# Patient Record
Sex: Female | Born: 1950
Health system: Southern US, Community
[De-identification: ages and names within clinical notes are randomized; demographics above are authoritative.]

## PROBLEM LIST (undated history)

## (undated) DIAGNOSIS — N200 Calculus of kidney: Secondary | ICD-10-CM

## (undated) DIAGNOSIS — N1831 Chronic kidney disease, stage 3a: Secondary | ICD-10-CM

## (undated) DIAGNOSIS — R7303 Prediabetes: Secondary | ICD-10-CM

## (undated) DIAGNOSIS — I10 Essential (primary) hypertension: Secondary | ICD-10-CM

## (undated) DIAGNOSIS — Z87442 Personal history of urinary calculi: Secondary | ICD-10-CM

## (undated) DIAGNOSIS — B182 Chronic viral hepatitis C: Secondary | ICD-10-CM

## (undated) DIAGNOSIS — N390 Urinary tract infection, site not specified: Secondary | ICD-10-CM

## (undated) DIAGNOSIS — F1120 Opioid dependence, uncomplicated: Secondary | ICD-10-CM

## (undated) DIAGNOSIS — J449 Chronic obstructive pulmonary disease, unspecified: Secondary | ICD-10-CM

## (undated) DIAGNOSIS — K839 Disease of biliary tract, unspecified: Secondary | ICD-10-CM

## (undated) DIAGNOSIS — D696 Thrombocytopenia, unspecified: Secondary | ICD-10-CM

## (undated) DIAGNOSIS — K759 Inflammatory liver disease, unspecified: Secondary | ICD-10-CM

## (undated) DIAGNOSIS — I872 Venous insufficiency (chronic) (peripheral): Secondary | ICD-10-CM

## (undated) DIAGNOSIS — F32A Depression, unspecified: Secondary | ICD-10-CM

## (undated) DIAGNOSIS — A419 Sepsis, unspecified organism: Secondary | ICD-10-CM

## (undated) HISTORY — PX: PLACEMENT OF BREAST IMPLANTS: SHX6334

## (undated) HISTORY — DX: Chronic obstructive pulmonary disease, unspecified: J44.9

## (undated) HISTORY — DX: Urinary tract infection, site not specified: N39.0

## (undated) HISTORY — PX: TONSILLECTOMY: SUR1361

## (undated) HISTORY — DX: Sepsis, unspecified organism: A41.9

## (undated) HISTORY — DX: Disease of biliary tract, unspecified: K83.9

---

## 2004-11-30 ENCOUNTER — Emergency Department: Payer: Self-pay | Admitting: Emergency Medicine

## 2016-04-09 LAB — HM HIV SCREENING LAB: HM HIV Screening: NEGATIVE

## 2016-04-09 LAB — HM HEPATITIS C SCREENING LAB: HM Hepatitis Screen: NEGATIVE

## 2017-05-28 ENCOUNTER — Ambulatory Visit: Payer: Medicaid Other | Admitting: Family Medicine

## 2017-06-16 ENCOUNTER — Ambulatory Visit (INDEPENDENT_AMBULATORY_CARE_PROVIDER_SITE_OTHER): Payer: Medicare Other | Admitting: Family Medicine

## 2017-06-16 ENCOUNTER — Encounter: Payer: Self-pay | Admitting: Family Medicine

## 2017-06-16 ENCOUNTER — Telehealth: Payer: Self-pay

## 2017-06-16 VITALS — BP 144/82 | HR 81 | Temp 98.5°F | Ht 63.1 in | Wt 138.1 lb

## 2017-06-16 DIAGNOSIS — J449 Chronic obstructive pulmonary disease, unspecified: Secondary | ICD-10-CM | POA: Diagnosis not present

## 2017-06-16 DIAGNOSIS — Z720 Tobacco use: Secondary | ICD-10-CM

## 2017-06-16 DIAGNOSIS — R0981 Nasal congestion: Secondary | ICD-10-CM

## 2017-06-16 DIAGNOSIS — Z13220 Encounter for screening for lipoid disorders: Secondary | ICD-10-CM | POA: Diagnosis not present

## 2017-06-16 DIAGNOSIS — Z23 Encounter for immunization: Secondary | ICD-10-CM

## 2017-06-16 DIAGNOSIS — Z13820 Encounter for screening for osteoporosis: Secondary | ICD-10-CM

## 2017-06-16 DIAGNOSIS — R030 Elevated blood-pressure reading, without diagnosis of hypertension: Secondary | ICD-10-CM | POA: Diagnosis not present

## 2017-06-16 DIAGNOSIS — Z1239 Encounter for other screening for malignant neoplasm of breast: Secondary | ICD-10-CM

## 2017-06-16 DIAGNOSIS — Z1231 Encounter for screening mammogram for malignant neoplasm of breast: Secondary | ICD-10-CM | POA: Diagnosis not present

## 2017-06-16 LAB — UA/M W/RFLX CULTURE, ROUTINE
Bilirubin, UA: NEGATIVE
Glucose, UA: NEGATIVE
Ketones, UA: NEGATIVE
LEUKOCYTES UA: NEGATIVE
Nitrite, UA: NEGATIVE
PH UA: 7 (ref 5.0–7.5)
PROTEIN UA: NEGATIVE
RBC, UA: NEGATIVE
Specific Gravity, UA: 1.01 (ref 1.005–1.030)
Urobilinogen, Ur: 2 mg/dL — ABNORMAL HIGH (ref 0.2–1.0)

## 2017-06-16 LAB — MICROSCOPIC EXAMINATION: BACTERIA UA: NONE SEEN

## 2017-06-16 LAB — MICROALBUMIN, URINE WAIVED
CREATININE, URINE WAIVED: 50 mg/dL (ref 10–300)
MICROALB, UR WAIVED: 10 mg/L (ref 0–19)

## 2017-06-16 MED ORDER — ALBUTEROL SULFATE HFA 108 (90 BASE) MCG/ACT IN AERS
2.0000 | INHALATION_SPRAY | Freq: Four times a day (QID) | RESPIRATORY_TRACT | 0 refills | Status: DC | PRN
Start: 2017-06-16 — End: 2017-08-27

## 2017-06-16 MED ORDER — UMECLIDINIUM-VILANTEROL 62.5-25 MCG/INH IN AEPB: 1.0000 | INHALATION_SPRAY | Freq: Every day | RESPIRATORY_TRACT | 3 refills | Status: DC

## 2017-06-16 NOTE — Telephone Encounter (Signed)
Referral faxed to [facility] - [address], [place], [place] ((P: [phone] F: [phone]))

## 2017-06-16 NOTE — Assessment & Plan Note (Signed)
Not under good control. Will start anoro and recheck 2-3 weeks. Albuterol sent to her pharmacy. Call with any concerns.

## 2017-06-16 NOTE — Assessment & Plan Note (Signed)
Advised patient to quit smoking. She is aware. Will let us know when she is ready.

## 2017-06-16 NOTE — Assessment & Plan Note (Signed)
Likely due to over use of decongestants. Given chronicity- will get her into see ENT. Referral generated today.

## 2017-06-16 NOTE — Progress Notes (Signed)
BP (!) 144/82   Pulse 81   Temp 98.5 F (36.9 C)   Ht 5' 3.1" (1.603 m)   Wt 138 lb 1 oz (62.6 kg)   SpO2 93%   BMI 24.38 kg/m    Subjective:    Patient ID: Kim Graham, female    DOB: 1951-07-23, 66 y.o.   MRN: [id number]  HPI: Kim Graham is a 66 y.o. female who presents today to establish care. Has not seen a doctor in 14 years.   Chief Complaint  Patient presents with  . COPD   COPD- states that she knows that she doesn't have the breath that she used to have.  COPD status: uncontrolled Satisfied with current treatment?: no Oxygen use: no Dyspnea frequency: Daily Cough frequency: Daily Rescue inhaler frequency: Doesn't have one   Limitation of activity: yes Productive cough: Yes Last Spirometry: Today Pneumovax: Not up to Date Influenza: Not up to Date  ELEVATED BLOOD PRESSURE- takes sudafed daily Duration of elevated BP: unknown BP monitoring frequency: not checking Previous BP meds: no Recent stressors: no Family history of hypertension: yes Recurrent headaches: no Visual changes: no Palpitations: no  Dyspnea: no Chest pain: no Lower extremity edema: no Dizzy/lightheaded: no Transient ischemic attacks: no  Chronic nasal congestion- states that she was on something else previously, but it didn't help. Now takes afrin and sudafed daily. Feels like "a mac truck is on my nose" in the morning when she wakes up. No fevers or chills.   Active Ambulatory Problems    Diagnosis Date Noted  . Chronic obstructive pulmonary disease ([place]) 06/16/2017  . Tobacco abuse 06/16/2017  . Chronic nasal congestion 06/16/2017   Resolved Ambulatory Problems    Diagnosis Date Noted  . No Resolved Ambulatory Problems   Past Medical History:  Diagnosis Date  . COPD (chronic obstructive pulmonary disease) ([place])    History reviewed. No pertinent surgical history.  Outpatient Encounter Prescriptions as of 06/16/2017  Medication Sig  . METHADONE HCL PO Take 83 mg by  mouth daily.  . [DISCONTINUED] pseudoephedrine (SUDAFED) 30 MG tablet Take 30 mg by mouth every 4 (four) hours as needed for congestion.  Marland Kitchen albuterol (PROVENTIL HFA;VENTOLIN HFA) 108 (90 Base) MCG/ACT inhaler Inhale 2 puffs into the lungs every 6 (six) hours as needed for wheezing or shortness of breath.  . umeclidinium-vilanterol (ANORO ELLIPTA) 62.5-25 MCG/INH AEPB Inhale 1 puff into the lungs daily.   No facility-administered encounter medications on file as of 06/16/2017.    No Known Allergies Social History   Social History  . Marital status: Single    Spouse name: N/A  . Number of children: N/A  . Years of education: N/A   Occupational History  . Not on file.   Social History Main Topics  . Smoking status: Current Every Day Smoker  . Smokeless tobacco: Never Used  . Alcohol use Yes     Comment: On occasion  . Drug use: No  . Sexual activity: Not Currently   Other Topics Concern  . Not on file   Social History Narrative  . No narrative on file   Family History  Problem Relation Age of Onset  . Diabetes Mother   . Cancer Father        Pancreatic  . Diabetes Maternal Aunt   . Diabetes Maternal Grandmother   . Dementia Maternal Grandfather   . Heart disease Maternal Grandfather     Review of Systems  Constitutional: Negative.   HENT:  Positive for congestion and sinus pressure. Negative for dental problem, drooling, ear discharge, ear pain, facial swelling, hearing loss, mouth sores, nosebleeds, postnasal drip, rhinorrhea, sinus pain, sneezing, sore throat, tinnitus, trouble swallowing and voice change.   Respiratory: Negative.   Cardiovascular: Negative.   Psychiatric/Behavioral: Negative.     Per HPI unless specifically indicated above     Objective:    BP (!) 144/82   Pulse 81   Temp 98.5 F (36.9 C)   Ht 5' 3.1" (1.603 m)   Wt 138 lb 1 oz (62.6 kg)   SpO2 93%   BMI 24.38 kg/m   Wt Readings from Last 3 Encounters:  06/16/17 138 lb 1 oz (62.6 kg)     Physical Exam  Constitutional: She is oriented to person, place, and time. She appears well-developed and well-nourished. No distress.  HENT:  Head: Normocephalic and atraumatic.  Right Ear: Hearing and external ear normal.  Left Ear: Hearing and external ear normal.  Nose: Nose normal.  Mouth/Throat: Oropharynx is clear and moist. No oropharyngeal exudate.  Eyes: Conjunctivae and lids are normal. Right eye exhibits no discharge. Left eye exhibits no discharge. No scleral icterus.  Cardiovascular: Normal rate, regular rhythm, normal heart sounds and intact distal pulses.  Exam reveals no gallop and no friction rub.   No murmur heard. Pulmonary/Chest: Effort normal and breath sounds normal. No respiratory distress. She has no wheezes. She has no rales. She exhibits no tenderness.  Musculoskeletal: Normal range of motion.  Neurological: She is alert and oriented to person, place, and time.  Skin: Skin is warm, dry and intact. No rash noted. She is not diaphoretic. No erythema. No pallor.  Psychiatric: She has a normal mood and affect. Her speech is normal and behavior is normal. Judgment and thought content normal. Cognition and memory are normal.  Nursing note and vitals reviewed.   Results for orders placed or performed in visit on 06/16/17  HM HIV SCREENING LAB  Result Value Ref Range   HM HIV Screening Negative - Patient reported   HM HEPATITIS C SCREENING LAB  Result Value Ref Range   HM Hepatitis Screen Negative - Patient Reported       Assessment & Plan:   Problem List Items Addressed This Visit      Respiratory   Chronic obstructive pulmonary disease ([place]) - Primary    Not under good control. Will start anoro and recheck 2-3 weeks. Albuterol sent to her pharmacy. Call with any concerns.       Relevant Medications   umeclidinium-vilanterol (ANORO ELLIPTA) 62.5-25 MCG/INH AEPB   albuterol (PROVENTIL HFA;VENTOLIN HFA) 108 (90 Base) MCG/ACT inhaler   Other Relevant Orders     Spirometry with Graph (Completed)   CBC with Differential/Platelet   Comprehensive metabolic panel   TSH     Other   Tobacco abuse    Advised patient to quit smoking. She is aware. Will let us know when she is ready.      Relevant Orders   CBC with Differential/Platelet   Comprehensive metabolic panel   UA/M w/rflx Culture, Routine   Pneumococcal conjugate vaccine 13-valent (Completed)   Chronic nasal congestion    Likely due to over use of decongestants. Given chronicity- will get her into see ENT. Referral generated today.      Relevant Orders   Ambulatory referral to ENT   Comprehensive metabolic panel    Other Visit Diagnoses    Screening for breast cancer  Mammogram ordered today.   Relevant Orders   MM DIGITAL SCREENING BILATERAL   Screening for osteoporosis       DEXA ordered today.   Relevant Orders   DG Bone Density   Elevated BP without diagnosis of hypertension       Stop sudafed. Work on [employer]. Recheck 2-3 weeks with follow up breathing.    Relevant Orders   Comprehensive metabolic panel   Microalbumin, Urine Waived   TSH   Screening for cholesterol level       Labs drawn today. Await results.    Relevant Orders   Lipid Panel w/o Chol/HDL Ratio       Follow up plan: Return ASAP (Before 8/12), for Welcome to Medicare.

## 2017-06-17 ENCOUNTER — Encounter: Payer: Self-pay | Admitting: Family Medicine

## 2017-06-17 LAB — CBC WITH DIFFERENTIAL/PLATELET
BASOS: 0 %
Basophils Absolute: 0 10*3/uL (ref 0.0–0.2)
EOS (ABSOLUTE): 0.1 10*3/uL (ref 0.0–0.4)
EOS: 2 %
HEMATOCRIT: 48.1 % — AB (ref 34.0–46.6)
Hemoglobin: 16.3 g/dL — ABNORMAL HIGH (ref 11.1–15.9)
IMMATURE GRANS (ABS): 0 10*3/uL (ref 0.0–0.1)
IMMATURE GRANULOCYTES: 0 %
LYMPHS: 31 %
Lymphocytes Absolute: 2.4 10*3/uL (ref 0.7–3.1)
MCH: 31.7 pg (ref 26.6–33.0)
MCHC: 33.9 g/dL (ref 31.5–35.7)
MCV: 93 fL (ref 79–97)
MONOCYTES: 7 %
Monocytes Absolute: 0.6 10*3/uL (ref 0.1–0.9)
NEUTROS PCT: 60 %
Neutrophils Absolute: 4.5 10*3/uL (ref 1.4–7.0)
Platelets: 180 10*3/uL (ref 150–379)
RBC: 5.15 x10E6/uL (ref 3.77–5.28)
RDW: 14.5 % (ref 12.3–15.4)
WBC: 7.5 10*3/uL (ref 3.4–10.8)

## 2017-06-17 LAB — LIPID PANEL W/O CHOL/HDL RATIO
CHOLESTEROL TOTAL: 159 mg/dL (ref 100–199)
HDL: 57 mg/dL (ref >39)
LDL CALC: 83 mg/dL (ref 0–99)
TRIGLYCERIDES: 96 mg/dL (ref 0–149)
VLDL CHOLESTEROL CAL: 19 mg/dL (ref 5–40)

## 2017-06-17 LAB — COMPREHENSIVE METABOLIC PANEL
ALK PHOS: 102 IU/L (ref 39–117)
ALT: 58 IU/L — ABNORMAL HIGH (ref 0–32)
AST: 52 IU/L — ABNORMAL HIGH (ref 0–40)
Albumin/Globulin Ratio: 1.5 (ref 1.2–2.2)
Albumin: 4.5 g/dL (ref 3.6–4.8)
BUN / CREAT RATIO: 16 (ref 12–28)
BUN: 12 mg/dL (ref 8–27)
Bilirubin Total: 0.6 mg/dL (ref 0.0–1.2)
CALCIUM: 9.9 mg/dL (ref 8.7–10.3)
CO2: 25 mmol/L (ref 20–29)
CREATININE: 0.73 mg/dL (ref 0.57–1.00)
Chloride: 99 mmol/L (ref 96–106)
GFR calc Af Amer: 100 mL/min/{1.73_m2} (ref >59)
GFR, EST NON AFRICAN AMERICAN: 87 mL/min/{1.73_m2} (ref >59)
GLUCOSE: 110 mg/dL — AB (ref 65–99)
Globulin, Total: 3 g/dL (ref 1.5–4.5)
Potassium: 3.8 mmol/L (ref 3.5–5.2)
SODIUM: 142 mmol/L (ref 134–144)
TOTAL PROTEIN: 7.5 g/dL (ref 6.0–8.5)

## 2017-06-17 LAB — TSH: TSH: 1.96 u[IU]/mL (ref 0.450–4.500)

## 2017-06-18 ENCOUNTER — Telehealth: Payer: Self-pay

## 2017-06-18 ENCOUNTER — Ambulatory Visit (INDEPENDENT_AMBULATORY_CARE_PROVIDER_SITE_OTHER): Payer: Medicare Other | Admitting: Family Medicine

## 2017-06-18 ENCOUNTER — Encounter: Payer: Self-pay | Admitting: Family Medicine

## 2017-06-18 VITALS — BP 132/74 | HR 90 | Temp 98.5°F | Ht 62.7 in | Wt 137.2 lb

## 2017-06-18 DIAGNOSIS — H5213 Myopia, bilateral: Secondary | ICD-10-CM | POA: Diagnosis not present

## 2017-06-18 DIAGNOSIS — Z7189 Other specified counseling: Secondary | ICD-10-CM

## 2017-06-18 DIAGNOSIS — Z124 Encounter for screening for malignant neoplasm of cervix: Secondary | ICD-10-CM

## 2017-06-18 DIAGNOSIS — Z136 Encounter for screening for cardiovascular disorders: Secondary | ICD-10-CM

## 2017-06-18 DIAGNOSIS — R9431 Abnormal electrocardiogram [ECG] [EKG]: Secondary | ICD-10-CM | POA: Diagnosis not present

## 2017-06-18 DIAGNOSIS — Z0000 Encounter for general adult medical examination without abnormal findings: Secondary | ICD-10-CM | POA: Diagnosis not present

## 2017-06-18 DIAGNOSIS — Z720 Tobacco use: Secondary | ICD-10-CM | POA: Diagnosis not present

## 2017-06-18 DIAGNOSIS — Z1211 Encounter for screening for malignant neoplasm of colon: Secondary | ICD-10-CM

## 2017-06-18 DIAGNOSIS — Z1231 Encounter for screening mammogram for malignant neoplasm of breast: Secondary | ICD-10-CM

## 2017-06-18 DIAGNOSIS — Z1239 Encounter for other screening for malignant neoplasm of breast: Secondary | ICD-10-CM

## 2017-06-18 NOTE — Telephone Encounter (Signed)
PCP Dr. Laural BenesJohnson office calling for new patient referral    Patient scheduled next new patient appt with arida 08/23/17 at 140 added to waitlist.    Dr. Laural BenesJohnson wants patient seen in 1- 2 weeks.  Please advise.

## 2017-06-18 NOTE — Progress Notes (Signed)
BP 132/74 (BP Location: Left Arm, Patient Position: Sitting, Cuff Size: Normal)   Pulse 90   Temp 98.5 F (36.9 C)   Ht 5' 2.7" (1.593 m)   Wt 137 lb 3 oz (62.2 kg)   SpO2 95%   BMI 24.53 kg/m    Subjective:    Patient ID: Kim Graham, female    DOB: [birth year], 66 y.o.   MRN: [id number]  HPI: Kim Graham is a 66 y.o. female presenting on 06/18/2017 for comprehensive medical examination. Current medical complaints include:none  She currently lives with: fiance Menopausal Symptoms: vaginal dryness- was on medicine in the past, but hasn't used anything, and hasn't had any issues since then.   Functional Status Survey: Is the patient deaf or have difficulty hearing?: No Does the patient have difficulty seeing, even when wearing glasses/contacts?: No Does the patient have difficulty concentrating, remembering, or making decisions?: No Does the patient have difficulty walking or climbing stairs?: No Does the patient have difficulty dressing or bathing?: No Does the patient have difficulty doing errands alone such as visiting a doctor's office or shopping?: No  Fall Risk  06/18/2017 06/16/2017  Falls in the past year? No No    Depression Screen Depression screen [facility] 2/9 06/18/2017 06/16/2017  Decreased Interest 0 0  Down, Depressed, Hopeless 0 0  PHQ - 2 Score 0 0   Advanced Directives Does patient have a HCPOA?    no Does patient have a living will or MOST form?  no  Past Medical History:  Past Medical History:  Diagnosis Date  . COPD (chronic obstructive pulmonary disease) ([facility])     Surgical History:  History reviewed. No pertinent surgical history.  Medications:  Current Outpatient Prescriptions on File Prior to Visit  Medication Sig  . albuterol (PROVENTIL HFA;VENTOLIN HFA) 108 (90 Base) MCG/ACT inhaler Inhale 2 puffs into the lungs every 6 (six) hours as needed for wheezing or shortness of breath.  . METHADONE HCL PO Take 83 mg by mouth daily.  Marland Kitchen  umeclidinium-vilanterol (ANORO ELLIPTA) 62.5-25 MCG/INH AEPB Inhale 1 puff into the lungs daily.   No current facility-administered medications on file prior to visit.     Allergies:  No Known Allergies  Social History:  Social History   Social History  . Marital status: Single    Spouse name: N/A  . Number of children: N/A  . Years of education: N/A   Occupational History  . Not on file.   Social History Main Topics  . Smoking status: Current Every Day Smoker  . Smokeless tobacco: Never Used  . Alcohol use Yes     Comment: On occasion  . Drug use: No  . Sexual activity: Not Currently   Other Topics Concern  . Not on file   Social History Narrative  . No narrative on file   History  Smoking Status  . Current Every Day Smoker  Smokeless Tobacco  . Never Used   History  Alcohol Use  . Yes    Comment: On occasion    Family History:  Family History  Problem Relation Age of Onset  . Diabetes Mother   . Cancer Father        Pancreatic  . Diabetes Maternal Aunt   . Diabetes Maternal Grandmother   . Dementia Maternal Grandfather   . Heart disease Maternal Grandfather     Past medical history, surgical history, medications, allergies, family history and social history reviewed with patient today and changes made  to appropriate areas of the chart.   Review of Systems  Constitutional: Negative.   HENT: Positive for congestion. Negative for ear discharge, ear pain, hearing loss, nosebleeds, sinus pain, sore throat and tinnitus.   Eyes: Negative.   Respiratory: Positive for shortness of breath. Negative for cough, hemoptysis, sputum production, wheezing and stridor.   Cardiovascular: Negative.   Gastrointestinal: Negative.   Genitourinary: Negative.   Musculoskeletal: Negative.   Skin: Negative.   Neurological: Negative.   Endo/Heme/Allergies: Negative.   Psychiatric/Behavioral: Negative.     All other ROS negative except what is listed above and in the  HPI.      Objective:    BP 132/74 (BP Location: Left Arm, Patient Position: Sitting, Cuff Size: Normal)   Pulse 90   Temp 98.5 F (36.9 C)   Ht 5' 2.7" (1.593 m)   Wt 137 lb 3 oz (62.2 kg)   SpO2 95%   BMI 24.53 kg/m   Wt Readings from Last 3 Encounters:  06/18/17 137 lb 3 oz (62.2 kg)  06/16/17 138 lb 1 oz (62.6 kg)     Hearing Screening   125Hz  250Hz  500Hz  1000Hz  2000Hz  3000Hz  4000Hz  6000Hz  8000Hz   Right ear:   Pass Pass Pass  Pass    Left ear:   Pass Pass Pass  Pass      Visual Acuity Screening   Right eye Left eye Both eyes  Without correction:     With correction: 20/100 20/40     Physical Exam  Constitutional: She is oriented to person, place, and time. She appears well-developed and well-nourished. No distress.  HENT:  Head: Normocephalic and atraumatic.  Right Ear: Hearing and external ear normal.  Left Ear: Hearing and external ear normal.  Nose: Nose normal.  Mouth/Throat: Oropharynx is clear and moist. No oropharyngeal exudate.  Eyes: Pupils are equal, round, and reactive to light. Conjunctivae, EOM and lids are normal. Right eye exhibits no discharge. Left eye exhibits no discharge. No scleral icterus.  Neck: Normal range of motion. Neck supple. No JVD present. No tracheal deviation present. No thyromegaly present.  Cardiovascular: Normal rate, regular rhythm, normal heart sounds and intact distal pulses.  Exam reveals no gallop and no friction rub.   No murmur heard. Pulmonary/Chest: Effort normal and breath sounds normal. No stridor. No respiratory distress. She has no wheezes. She has no rales. She exhibits no tenderness. Right breast exhibits no inverted nipple, no mass, no nipple discharge, no skin change and no tenderness. Left breast exhibits no inverted nipple, no mass, no nipple discharge, no skin change and no tenderness. Breasts are symmetrical.  Abdominal: Soft. Bowel sounds are normal. She exhibits no distension and no mass. There is no tenderness.  There is no rebound and no guarding. Hernia confirmed negative in the right inguinal area and confirmed negative in the left inguinal area.  Genitourinary: Vagina normal and uterus normal. No labial fusion. There is no rash, tenderness, lesion or injury on the right labia. There is no rash, tenderness, lesion or injury on the left labia. Uterus is not deviated, not enlarged, not fixed and not tender. Cervix exhibits no motion tenderness, no discharge and no friability. Right adnexum displays no mass, no tenderness and no fullness. Left adnexum displays no mass, no tenderness and no fullness. No erythema, tenderness or bleeding in the vagina. No foreign body in the vagina. No signs of injury around the vagina. No vaginal discharge found.  Genitourinary Comments: Some atrophy  Musculoskeletal: Normal range of  motion. She exhibits no edema, tenderness or deformity.  Lymphadenopathy:    She has no cervical adenopathy.  Neurological: She is alert and oriented to person, place, and time. She has normal reflexes. She displays normal reflexes. No cranial nerve deficit. She exhibits normal muscle tone. Coordination normal.  Skin: Skin is warm, dry and intact. No rash noted. She is not diaphoretic. No erythema. No pallor.  Psychiatric: She has a normal mood and affect. Her speech is normal and behavior is normal. Judgment and thought content normal. Cognition and memory are normal.  Nursing note and vitals reviewed.   6CIT Screen 06/18/2017  What Year? 0 points  What month? 0 points  What time? 0 points  Count back from 20 0 points  Months in reverse 0 points  Repeat phrase 0 points  Total Score 0     Results for orders placed or performed in visit on 06/16/17  Microscopic Examination  Result Value Ref Range   WBC, UA 0-5 0 - 5 /hpf   RBC, UA 0-2 0 - 2 /hpf   Epithelial Cells (non renal) 0-10 0 - 10 /hpf   Bacteria, UA None seen None seen/Few  HM HIV SCREENING LAB  Result Value Ref Range   HM  HIV Screening Negative - Patient reported   HM HEPATITIS C SCREENING LAB  Result Value Ref Range   HM Hepatitis Screen Negative - Patient Reported   CBC with Differential/Platelet  Result Value Ref Range   WBC 7.5 3.4 - 10.8 x10E3/uL   RBC 5.15 3.77 - 5.28 x10E6/uL   Hemoglobin 16.3 (H) 11.1 - 15.9 g/dL   Hematocrit 16.1 (H) 09.6 - 46.6 %   MCV 93 79 - 97 fL   MCH 31.7 26.6 - 33.0 pg   MCHC 33.9 31.5 - 35.7 g/dL   RDW 04.5 40.9 - 81.1 %   Platelets 180 150 - 379 x10E3/uL   Neutrophils 60 Not Estab. %   Lymphs 31 Not Estab. %   Monocytes 7 Not Estab. %   Eos 2 Not Estab. %   Basos 0 Not Estab. %   Neutrophils Absolute 4.5 1.4 - 7.0 x10E3/uL   Lymphocytes Absolute 2.4 0.7 - 3.1 x10E3/uL   Monocytes Absolute 0.6 0.1 - 0.9 x10E3/uL   EOS (ABSOLUTE) 0.1 0.0 - 0.4 x10E3/uL   Basophils Absolute 0.0 0.0 - 0.2 x10E3/uL   Immature Granulocytes 0 Not Estab. %   Immature Grans (Abs) 0.0 0.0 - 0.1 x10E3/uL  Comprehensive metabolic panel  Result Value Ref Range   Glucose 110 (H) 65 - 99 mg/dL   BUN 12 8 - 27 mg/dL   Creatinine, Ser 9.14 0.57 - 1.00 mg/dL   GFR calc non Af Amer 87 >59 mL/min/1.73   GFR calc Af Amer 100 >59 mL/min/1.73   BUN/Creatinine Ratio 16 12 - 28   Sodium 142 134 - 144 mmol/L   Potassium 3.8 3.5 - 5.2 mmol/L   Chloride 99 96 - 106 mmol/L   CO2 25 20 - 29 mmol/L   Calcium 9.9 8.7 - 10.3 mg/dL   Total Protein 7.5 6.0 - 8.5 g/dL   Albumin 4.5 3.6 - 4.8 g/dL   Globulin, Total 3.0 1.5 - 4.5 g/dL   Albumin/Globulin Ratio 1.5 1.2 - 2.2   Bilirubin Total 0.6 0.0 - 1.2 mg/dL   Alkaline Phosphatase 102 39 - 117 IU/L   AST 52 (H) 0 - 40 IU/L   ALT 58 (H) 0 - 32 IU/L  Lipid Panel  w/o Chol/HDL Ratio  Result Value Ref Range   Cholesterol, Total 159 100 - 199 mg/dL   Triglycerides 96 0 - 149 mg/dL   HDL 57 >30>39 mg/dL   VLDL Cholesterol Cal 19 5 - 40 mg/dL   LDL Calculated 83 0 - 99 mg/dL  Microalbumin, Urine Waived  Result Value Ref Range   Microalb, Ur Waived 10 0 -  19 mg/L   Creatinine, Urine Waived 50 10 - 300 mg/dL   Microalb/Creat Ratio 30-300 (H) <30 mg/g  TSH  Result Value Ref Range   TSH 1.960 0.450 - 4.500 uIU/mL  UA/M w/rflx Culture, Routine  Result Value Ref Range   Specific Gravity, UA 1.010 1.005 - 1.030   pH, UA 7.0 5.0 - 7.5   Color, UA Yellow Yellow   Appearance Ur Cloudy (A) Clear   Leukocytes, UA Negative Negative   Protein, UA Negative Negative/Trace   Glucose, UA Negative Negative   Ketones, UA Negative Negative   RBC, UA Negative Negative   Bilirubin, UA Negative Negative   Urobilinogen, Ur 2.0 (H) 0.2 - 1.0 mg/dL   Nitrite, UA Negative Negative   Microscopic Examination See below:       Assessment & Plan:   Problem List Items Addressed This Visit      Other   Tobacco abuse    1ppd for about 40 years- will put in contact with Gl[facility] for lung cancer screening      Advance directive discussed with patient    A voluntary discussion about advance care planning including the explanation and discussion of advance directives was extensively discussed  with the patient.  Explanation about the health care proxy and Living will was reviewed and packet with forms with explanation of how to fill them out was given.  During this discussion, the patient was not able to identify a health care proxy but plans to fill out the paperwork required.  Patient was offered a separate Advance Care Planning visit for further assistance with forms.          Other Visit Diagnoses    Welcome to Medicare preventive visit    -  Primary   Preventative care discussed today as below.    Encounter for screening for cardiovascular disorders       EKG done today as part of welcome to medicare visit. Abnormal. See below.    Relevant Orders   EKG 12-Lead (Completed)   Screening for cervical cancer       Pap done today as below.    Relevant Orders   IGP, Aptima HPV, rfx 16/18,45   Abnormal EKG       Delta waves on EKG concerning for WPW.  Nothing previous to compare it to. Will get her into cardiology. Appointment scheduled today.   Relevant Orders   Ambulatory referral to Cardiology   Screening for colon cancer       Cologuard ordered today.   Relevant Orders   Cologuard   Screening for breast cancer       Mammogram ordered today.   Relevant Orders   MM DIGITAL SCREENING W/ IMPLANTS BILATERAL   Myopia of both eyes       Will refer to opthalmology.   Relevant Orders   Ambulatory referral to Ophthalmology      Preventative Services:  AAA screening: N/A Health Risk Assessment and Personalized Prevention Plan: Done today Bone Mass Measurements: Ordered last visit Breast Cancer Screening: Ordered last visit CVD Screening: Done today  Cervical Cancer Screening: Done today Colon Cancer Screening: Cologuard ordered today Depression Screening: Done today Diabetes Screening: Done last visit Glaucoma Screening: See your eye doctor Hepatitis B vaccine: N/A Hepatitis C screening: Done last visit HIV Screening: Done last visit Flu Vaccine: Get in October Lung cancer Screening: Ordered today Obesity Screening: Done today Pneumonia Vaccines (2): 1st given- next due next year STI Screening: N/A  Follow up plan: Return in about 4 weeks (around 07/16/2017) for Follow up breathing with spiro.   LABORATORY TESTING:  - Pap smear: pap done  IMMUNIZATIONS:   - Tdap: Tetanus vaccination status reviewed: tetanus status unknown to the patient- will come in if she has a cut. - Influenza: Postponed to flu season - Pneumovax: Due next year - Prevnar: Up to date - Zostavax vaccine: Will check with insurance  SCREENING: -Mammogram: Ordered last visit  - Colonoscopy:  Cologuard ordered today. - Bone Density: Ordered last visit  -Hearing Test: Ordered today   PATIENT COUNSELING:   Advised to take 1 mg of folate supplement per day if capable of pregnancy.   Sexuality: Discussed sexually transmitted diseases, partner selection,  use of condoms, avoidance of unintended pregnancy  and contraceptive alternatives.   Advised to avoid cigarette smoking.  I discussed with the patient that most people either abstain from alcohol or drink within safe limits (<=14/week and <=4 drinks/occasion for males, <=7/weeks and <= 3 drinks/occasion for females) and that the risk for alcohol disorders and other health effects rises proportionally with the number of drinks per week and how often a drinker exceeds daily limits.  Discussed cessation/primary prevention of drug use and availability of treatment for abuse.   Diet: Encouraged to adjust caloric intake to maintain  or achieve ideal body weight, to reduce intake of dietary saturated fat and total fat, to limit sodium intake by avoiding high sodium foods and not adding table salt, and to maintain adequate dietary potassium and calcium preferably from fresh fruits, vegetables, and low-fat dairy products.    stressed the importance of regular exercise  Injury prevention: Discussed safety belts, safety helmets, smoke detector, smoking near bedding or upholstery.   Dental health: Discussed importance of regular tooth brushing, flossing, and dental visits.    NEXT PREVENTATIVE PHYSICAL DUE IN 1 YEAR. Return in about 4 weeks (around 07/16/2017) for Follow up breathing with spiro.

## 2017-06-18 NOTE — Patient Instructions (Addendum)
[facility] 08/23/17 1:40PM Dr. Fletcher Anon [address], [place], [place] [address]  Phone: [phone] You are on a waiting list.  The doctor is going to review your EKG and may call you earlier to be seen   Preventative Services:  AAA screening: N/A Health Risk Assessment and Personalized Prevention Plan: Done today Bone Mass Measurements: Ordered last visit Breast Cancer Screening: Ordered last visit CVD Screening: Done today Cervical Cancer Screening: Done today Colon Cancer Screening: Cologuard ordered today Depression Screening: Done today Diabetes Screening: Done last visit Glaucoma Screening: See your eye doctor Hepatitis B vaccine: N/A Hepatitis C screening: Done last visit HIV Screening: Done last visit Flu Vaccine: Get in October Lung cancer Screening: Ordered today Obesity Screening: Done today Pneumonia Vaccines (2): 1st given- next due next year STI Screening: N/A  Health Maintenance, Female Adopting a healthy lifestyle and getting preventive care can go a long way to promote health and wellness. Talk with your health care provider about what schedule of regular examinations is right for you. This is a good chance for you to check in with your provider about disease prevention and staying healthy. In between checkups, there are plenty of things you can do on your own. Experts have done a lot of research about which lifestyle changes and preventive measures are most likely to keep you healthy. Ask your health care provider for more information. Weight and diet Eat a healthy diet  Be sure to include plenty of vegetables, fruits, low-fat dairy products, and lean protein.  Do not eat a lot of foods high in solid fats, added sugars, or salt.  Get regular exercise. This is one of the most important things you can do for your health. ? Most adults should exercise for at least 150 minutes each week. The exercise should increase your heart rate and make you  sweat (moderate-intensity exercise). ? Most adults should also do strengthening exercises at least twice a week. This is in addition to the moderate-intensity exercise.  Maintain a healthy weight  Body mass index (BMI) is a measurement that can be used to identify possible weight problems. It estimates body fat based on height and weight. Your health care provider can help determine your BMI and help you achieve or maintain a healthy weight.  For females 57 years of age and older: ? A BMI below 18.5 is considered underweight. ? A BMI of 18.5 to 24.9 is normal. ? A BMI of 25 to 29.9 is considered overweight. ? A BMI of 30 and above is considered obese.  Watch levels of cholesterol and blood lipids  You should start having your blood tested for lipids and cholesterol at 65 years of age, then have this test every 5 years.  You may need to have your cholesterol levels checked more often if: ? Your lipid or cholesterol levels are high. ? You are older than 65 years of age. ? You are at high risk for heart disease.  Cancer screening Lung Cancer  Lung cancer screening is recommended for adults 13-15 years old who are at high risk for lung cancer because of a history of smoking.  A yearly low-dose CT scan of the lungs is recommended for people who: ? Currently smoke. ? Have quit within the past 15 years. ? Have at least a 30-pack-year history of smoking. A pack year is smoking an average of one pack of cigarettes a day for 1 year.  Yearly screening should continue until it has been  15 years since you quit.  Yearly screening should stop if you develop a health problem that would prevent you from having lung cancer treatment.  Breast Cancer  Practice breast [place]-awareness. This means understanding how your breasts normally appear and feel.  It also means doing regular breast [place]-exams. Let your health care provider know about any changes, no matter how small.  If you are in your 20s  or 30s, you should have a clinical breast exam (CBE) by a health care provider every 1-3 years as part of a regular health exam.  If you are 13 or older, have a CBE every year. Also consider having a breast X-ray (mammogram) every year.  If you have a family history of breast cancer, talk to your health care provider about genetic screening.  If you are at high risk for breast cancer, talk to your health care provider about having an MRI and a mammogram every year.  Breast cancer gene (BRCA) assessment is recommended for women who have family members with BRCA-related cancers. BRCA-related cancers include: ? Breast. ? Ovarian. ? Tubal. ? Peritoneal cancers.  Results of the assessment will determine the need for genetic counseling and BRCA1 and BRCA2 testing.  Cervical Cancer Your health care provider may recommend that you be screened regularly for cancer of the pelvic organs (ovaries, uterus, and vagina). This screening involves a pelvic examination, including checking for microscopic changes to the surface of your cervix (Pap test). You may be encouraged to have this screening done every 3 years, beginning at age 19.  For women ages 27-65, health care providers may recommend pelvic exams and Pap testing every 3 years, or they may recommend the Pap and pelvic exam, combined with testing for human papilloma virus (HPV), every 5 years. Some types of HPV increase your risk of cervical cancer. Testing for HPV may also be done on women of any age with unclear Pap test results.  Other health care providers may not recommend any screening for nonpregnant women who are considered low risk for pelvic cancer and who do not have symptoms. Ask your health care provider if a screening pelvic exam is right for you.  If you have had past treatment for cervical cancer or a condition that could lead to cancer, you need Pap tests and screening for cancer for at least 20 years after your treatment. If Pap tests  have been discontinued, your risk factors (such as having a new sexual partner) need to be reassessed to determine if screening should resume. Some women have medical problems that increase the chance of getting cervical cancer. In these cases, your health care provider may recommend more frequent screening and Pap tests.  Colorectal Cancer  This type of cancer can be detected and often prevented.  Routine colorectal cancer screening usually begins at 64 years of age and continues through 65 years of age.  Your health care provider may recommend screening at an earlier age if you have risk factors for colon cancer.  Your health care provider may also recommend using home test kits to check for hidden blood in the stool.  A small camera at the end of a tube can be used to examine your colon directly (sigmoidoscopy or colonoscopy). This is done to check for the earliest forms of colorectal cancer.  Routine screening usually begins at age 35.  Direct examination of the colon should be repeated every 5-10 years through 65 years of age. However, you may need to be screened more  often if early forms of precancerous polyps or small growths are found.  Skin Cancer  Check your skin from head to toe regularly.  Tell your health care provider about any new moles or changes in moles, especially if there is a change in a mole's shape or color.  Also tell your health care provider if you have a mole that is larger than the size of a pencil eraser.  Always use sunscreen. Apply sunscreen liberally and repeatedly throughout the day.  Protect yourself by wearing long sleeves, pants, a wide-brimmed hat, and sunglasses whenever you are outside.  Heart disease, diabetes, and high blood pressure  High blood pressure causes heart disease and increases the risk of stroke. High blood pressure is more likely to develop in: ? People who have blood pressure in the high end of the normal range (130-139/85-89 mm  Hg). ? People who are overweight or obese. ? People who are African American.  If you are 63-66 years of age, have your blood pressure checked every 3-5 years. If you are 35 years of age or older, have your blood pressure checked every year. You should have your blood pressure measured twice-once when you are at a hospital or clinic, and once when you are not at a hospital or clinic. Record the average of the two measurements. To check your blood pressure when you are not at a hospital or clinic, you can use: ? An automated blood pressure machine at a pharmacy. ? A home blood pressure monitor.  If you are between 59 years and 8 years old, ask your health care provider if you should take aspirin to prevent strokes.  Have regular diabetes screenings. This involves taking a blood sample to check your fasting blood sugar level. ? If you are at a normal weight and have a low risk for diabetes, have this test once every three years after 65 years of age. ? If you are overweight and have a high risk for diabetes, consider being tested at a younger age or more often. Preventing infection Hepatitis B  If you have a higher risk for hepatitis B, you should be screened for this virus. You are considered at high risk for hepatitis B if: ? You were born in a country where hepatitis B is common. Ask your health care provider which countries are considered high risk. ? Your parents were born in a high-risk country, and you have not been immunized against hepatitis B (hepatitis B vaccine). ? You have HIV or AIDS. ? You use needles to inject street drugs. ? You live with someone who has hepatitis B. ? You have had sex with someone who has hepatitis B. ? You get hemodialysis treatment. ? You take certain medicines for conditions, including cancer, organ transplantation, and autoimmune conditions.  Hepatitis C  Blood testing is recommended for: ? Everyone born from [birth year] through 1964. ? Anyone with known  risk factors for hepatitis C.  Sexually transmitted infections (STIs)  You should be screened for sexually transmitted infections (STIs) including gonorrhea and chlamydia if: ? You are sexually active and are younger than 65 years of age. ? You are older than 65 years of age and your health care provider tells you that you are at risk for this type of infection. ? Your sexual activity has changed since you were last screened and you are at an increased risk for chlamydia or gonorrhea. Ask your health care provider if you are at risk.  If you do  not have HIV, but are at risk, it may be recommended that you take a prescription medicine daily to prevent HIV infection. This is called pre-exposure prophylaxis (PrEP). You are considered at risk if: ? You are sexually active and do not regularly use condoms or know the HIV status of your partner(s). ? You take drugs by injection. ? You are sexually active with a partner who has HIV.  Talk with your health care provider about whether you are at high risk of being infected with HIV. If you choose to begin PrEP, you should first be tested for HIV. You should then be tested every 3 months for as long as you are taking PrEP. Pregnancy  If you are premenopausal and you may become pregnant, ask your health care provider about preconception counseling.  If you may become pregnant, take 400 to 800 micrograms (mcg) of folic acid every day.  If you want to prevent pregnancy, talk to your health care provider about birth control (contraception). Osteoporosis and menopause  Osteoporosis is a disease in which the bones lose minerals and strength with aging. This can result in serious bone fractures. Your risk for osteoporosis can be identified using a bone density scan.  If you are 3 years of age or older, or if you are at risk for osteoporosis and fractures, ask your health care provider if you should be screened.  Ask your health care provider whether you  should take a calcium or vitamin D supplement to lower your risk for osteoporosis.  Menopause may have certain physical symptoms and risks.  Hormone replacement therapy may reduce some of these symptoms and risks. Talk to your health care provider about whether hormone replacement therapy is right for you. Follow these instructions at home:  Schedule regular health, dental, and eye exams.  Stay current with your immunizations.  Do not use any tobacco products including cigarettes, chewing tobacco, or electronic cigarettes.  If you are pregnant, do not drink alcohol.  If you are breastfeeding, limit how much and how often you drink alcohol.  Limit alcohol intake to no more than 1 drink per day for nonpregnant women. One drink equals 12 ounces of beer, 5 ounces of wine, or 1 ounces of hard liquor.  Do not use street drugs.  Do not share needles.  Ask your health care provider for help if you need support or information about quitting drugs.  Tell your health care provider if you often feel depressed.  Tell your health care provider if you have ever been abused or do not feel safe at home. This information is not intended to replace advice given to you by your health care provider. Make sure you discuss any questions you have with your health care provider. Document Released: 05/11/2011 Document Revised: 04/02/2016 Document Reviewed: 07/30/2015 Elsevier Interactive Patient Education  2018 [place] Maintenance for Postmenopausal Women Menopause is a normal process in which your reproductive ability comes to an end. This process happens gradually over a span of months to years, usually between the ages of 15 and 39. Menopause is complete when you have missed 12 consecutive menstrual periods. It is important to talk with your health care provider about some of the most common conditions that affect postmenopausal women, such as heart disease, cancer, and bone loss  (osteoporosis). Adopting a healthy lifestyle and getting preventive care can help to promote your health and wellness. Those actions can also lower your chances of developing some of these common conditions.  What should I know about menopause? During menopause, you may experience a number of symptoms, such as:  Moderate-to-severe hot flashes.  Night sweats.  Decrease in sex drive.  Mood swings.  Headaches.  Tiredness.  Irritability.  Memory problems.  Insomnia.  Choosing to treat or not to treat menopausal changes is an individual decision that you make with your health care provider. What should I know about hormone replacement therapy and supplements? Hormone therapy products are effective for treating symptoms that are associated with menopause, such as hot flashes and night sweats. Hormone replacement carries certain risks, especially as you become older. If you are thinking about using estrogen or estrogen with progestin treatments, discuss the benefits and risks with your health care provider. What should I know about heart disease and stroke? Heart disease, heart attack, and stroke become more likely as you age. This may be due, in part, to the hormonal changes that your body experiences during menopause. These can affect how your body processes dietary fats, triglycerides, and cholesterol. Heart attack and stroke are both medical emergencies. There are many things that you can do to help prevent heart disease and stroke:  Have your blood pressure checked at least every 1-2 years. High blood pressure causes heart disease and increases the risk of stroke.  If you are 65-47 years old, ask your health care provider if you should take aspirin to prevent a heart attack or a stroke.  Do not use any tobacco products, including cigarettes, chewing tobacco, or electronic cigarettes. If you need help quitting, ask your health care provider.  It is important to eat a healthy diet and  maintain a healthy weight. ? Be sure to include plenty of vegetables, fruits, low-fat dairy products, and lean protein. ? Avoid eating foods that are high in solid fats, added sugars, or salt (sodium).  Get regular exercise. This is one of the most important things that you can do for your health. ? Try to exercise for at least 150 minutes each week. The type of exercise that you do should increase your heart rate and make you sweat. This is known as moderate-intensity exercise. ? Try to do strengthening exercises at least twice each week. Do these in addition to the moderate-intensity exercise.  Know your numbers.Ask your health care provider to check your cholesterol and your blood glucose. Continue to have your blood tested as directed by your health care provider.  What should I know about cancer screening? There are several types of cancer. Take the following steps to reduce your risk and to catch any cancer development as early as possible. Breast Cancer  Practice breast Far[place]-awareness. ? This means understanding how your breasts normally appear and feel. ? It also means doing regular breast [place]ir[place]-exams. Let your health care provider know about any changes, no matter how small.  If you are 69 or older, have a clinician do a breast exam (clinical breast exam or CBE) every year. Depending on your age, family history, and medical history, it may be recommended that you also have a yearly breast X-ray (mammogram).  If you have a family history of breast cancer, talk with your health care provider about genetic screening.  If you are at high risk for breast cancer, talk with your health care provider about having an MRI and a mammogram every year.  Breast cancer (BRCA) gene test is recommended for women who have family members with BRCA-related cancers. Results of the assessment will determine the need for genetic  counseling and BRCA1 and for BRCA2 testing. BRCA-related cancers include these  types: ? Breast. This occurs in males or females. ? Ovarian. ? Tubal. This may also be called fallopian tube cancer. ? Cancer of the abdominal or pelvic lining (peritoneal cancer). ? Prostate. ? Pancreatic.  Cervical, Uterine, and Ovarian Cancer Your health care provider may recommend that you be screened regularly for cancer of the pelvic organs. These include your ovaries, uterus, and vagina. This screening involves a pelvic exam, which includes checking for microscopic changes to the surface of your cervix (Pap test).  For women ages 21-65, health care providers may recommend a pelvic exam and a Pap test every three years. For women ages 67-65, they may recommend the Pap test and pelvic exam, combined with testing for human papilloma virus (HPV), every five years. Some types of HPV increase your risk of cervical cancer. Testing for HPV may also be done on women of any age who have unclear Pap test results.  Other health care providers may not recommend any screening for nonpregnant women who are considered low risk for pelvic cancer and have no symptoms. Ask your health care provider if a screening pelvic exam is right for you.  If you have had past treatment for cervical cancer or a condition that could lead to cancer, you need Pap tests and screening for cancer for at least 20 years after your treatment. If Pap tests have been discontinued for you, your risk factors (such as having a new sexual partner) need to be reassessed to determine if you should start having screenings again. Some women have medical problems that increase the chance of getting cervical cancer. In these cases, your health care provider may recommend that you have screening and Pap tests more often.  If you have a family history of uterine cancer or ovarian cancer, talk with your health care provider about genetic screening.  If you have vaginal bleeding after reaching menopause, tell your health care provider.  There  are currently no reliable tests available to screen for ovarian cancer.  Lung Cancer Lung cancer screening is recommended for adults 45-51 years old who are at high risk for lung cancer because of a history of smoking. A yearly low-dose CT scan of the lungs is recommended if you:  Currently smoke.  Have a history of at least 30 pack-years of smoking and you currently smoke or have quit within the past 15 years. A pack-year is smoking an average of one pack of cigarettes per day for one year.  Yearly screening should:  Continue until it has been 15 years since you quit.  Stop if you develop a health problem that would prevent you from having lung cancer treatment.  Colorectal Cancer  This type of cancer can be detected and can often be prevented.  Routine colorectal cancer screening usually begins at age 8 and continues through age 7.  If you have risk factors for colon cancer, your health care provider may recommend that you be screened at an earlier age.  If you have a family history of colorectal cancer, talk with your health care provider about genetic screening.  Your health care provider may also recommend using home test kits to check for hidden blood in your stool.  A small camera at the end of a tube can be used to examine your colon directly (sigmoidoscopy or colonoscopy). This is done to check for the earliest forms of colorectal cancer.  Direct examination of the colon should  be repeated every 5-10 years until age 50. However, if early forms of precancerous polyps or small growths are found or if you have a family history or genetic risk for colorectal cancer, you may need to be screened more often.  Skin Cancer  Check your skin from head to toe regularly.  Monitor any moles. Be sure to tell your health care provider: ? About any new moles or changes in moles, especially if there is a change in a mole's shape or color. ? If you have a mole that is larger than the  size of a pencil eraser.  If any of your family members has a history of skin cancer, especially at a young age, talk with your health care provider about genetic screening.  Always use sunscreen. Apply sunscreen liberally and repeatedly throughout the day.  Whenever you are outside, protect yourself by wearing long sleeves, pants, a wide-brimmed hat, and sunglasses.  What should I know about osteoporosis? Osteoporosis is a condition in which bone destruction happens more quickly than new bone creation. After menopause, you may be at an increased risk for osteoporosis. To help prevent osteoporosis or the bone fractures that can happen because of osteoporosis, the following is recommended:  If you are 32-32 years old, get at least 1,000 mg of calcium and at least 600 mg of vitamin D per day.  If you are older than age 39 but younger than age 68, get at least 1,200 mg of calcium and at least 600 mg of vitamin D per day.  If you are older than age 70, get at least 1,200 mg of calcium and at least 800 mg of vitamin D per day.  Smoking and excessive alcohol intake increase the risk of osteoporosis. Eat foods that are rich in calcium and vitamin D, and do weight-bearing exercises several times each week as directed by your health care provider. What should I know about how menopause affects my mental health? Depression may occur at any age, but it is more common as you become older. Common symptoms of depression include:  Low or sad mood.  Changes in sleep patterns.  Changes in appetite or eating patterns.  Feeling an overall lack of motivation or enjoyment of activities that you previously enjoyed.  Frequent crying spells.  Talk with your health care provider if you think that you are experiencing depression. What should I know about immunizations? It is important that you get and maintain your immunizations. These include:  Tetanus, diphtheria, and pertussis (Tdap) booster  vaccine.  Influenza every year before the flu season begins.  Pneumonia vaccine.  Shingles vaccine.  Your health care provider may also recommend other immunizations. This information is not intended to replace advice given to you by your health care provider. Make sure you discuss any questions you have with your health care provider. Document Released: 12/18/2005 Document Revised: 05/15/2016 Document Reviewed: 07/30/2015 Elsevier Interactive Patient Education  2018 [employer].

## 2017-06-18 NOTE — Assessment & Plan Note (Signed)
A voluntary discussion about advance care planning including the explanation and discussion of advance directives was extensively discussed  with the patient.  Explanation about the health care proxy and Living will was reviewed and packet with forms with explanation of how to fill them out was given.  During this discussion, the patient was not able to identify a health care proxy but plans to fill out the paperwork required.  Patient was offered a separate Advance Care Planning visit for further assistance with forms.    

## 2017-06-18 NOTE — Assessment & Plan Note (Addendum)
1ppd for about 40 years- will put in contact with Kim FellowsShawn Perkins for lung cancer screening

## 2017-06-21 ENCOUNTER — Telehealth: Payer: Self-pay | Admitting: *Deleted

## 2017-06-21 DIAGNOSIS — Z87891 Personal history of nicotine dependence: Secondary | ICD-10-CM

## 2017-06-21 DIAGNOSIS — Z122 Encounter for screening for malignant neoplasm of respiratory organs: Secondary | ICD-10-CM

## 2017-06-21 NOTE — Telephone Encounter (Signed)
S/w gentleman at pt's home number who requests I call back in 15-20 minutes.

## 2017-06-21 NOTE — Telephone Encounter (Signed)
Received referral for initial lung cancer screening scan. Contacted patient and obtained smoking history,(current, 49 pack year) as well as answering questions related to screening process. Patient denies signs of lung cancer such as weight loss or hemoptysis. Patient denies comorbidity that would prevent curative treatment if lung cancer were found. Patient is scheduled for shared decision making visit and CT scan on 07/06/17.

## 2017-06-21 NOTE — Telephone Encounter (Signed)
Received referral for low dose lung cancer screening CT scan. Attempted to leave message at phone number listed in EMR for patient to call me back to facilitate scheduling scan. However, this option is not available. Will attempt to contact at later date.  

## 2017-06-21 NOTE — Telephone Encounter (Signed)
This note is entered in error.

## 2017-06-21 NOTE — Telephone Encounter (Signed)
Per Pam : Do we have a 24 hr slot?  Kim Graham has a same day 420pm appt tomorrow   Please advise.

## 2017-06-21 NOTE — Telephone Encounter (Signed)
Called patient back to offer 06/21/17, 4:20pm appt with Dr. Mariah MillingGollan. Pt states she has never had heart trouble and can not make this appt. States she will keep October appt but is appreciative of the call.

## 2017-06-22 ENCOUNTER — Ambulatory Visit: Payer: Self-pay | Admitting: Cardiovascular Disease

## 2017-06-22 ENCOUNTER — Encounter: Payer: Self-pay | Admitting: Family Medicine

## 2017-06-22 LAB — IGP, APTIMA HPV, RFX 16/18,45
HPV APTIMA: NEGATIVE
PAP SMEAR COMMENT: 0

## 2017-07-05 ENCOUNTER — Encounter: Payer: Self-pay | Admitting: Oncology

## 2017-07-05 DIAGNOSIS — H53032 Strabismic amblyopia, left eye: Secondary | ICD-10-CM | POA: Diagnosis not present

## 2017-07-05 DIAGNOSIS — H2513 Age-related nuclear cataract, bilateral: Secondary | ICD-10-CM | POA: Diagnosis not present

## 2017-07-06 ENCOUNTER — Inpatient Hospital Stay: Payer: Self-pay | Admitting: Oncology

## 2017-07-06 ENCOUNTER — Ambulatory Visit: Payer: Medicare Other

## 2017-07-07 ENCOUNTER — Telehealth: Payer: Self-pay | Admitting: *Deleted

## 2017-07-07 NOTE — Telephone Encounter (Signed)
Patient was no show at scheduled lung screening on 07/06/17. Message left for patient to call back to reschedule.

## 2017-07-16 ENCOUNTER — Ambulatory Visit: Payer: Medicare Other | Admitting: Family Medicine

## 2017-07-21 ENCOUNTER — Telehealth: Payer: Self-pay | Admitting: Cardiovascular Disease

## 2017-07-21 NOTE — Telephone Encounter (Signed)
Offered pt. 9/13, 9am appt w/Dr. Kirke CorinArida. Pt declined stating she can not make this.

## 2017-07-22 ENCOUNTER — Ambulatory Visit: Payer: Medicare Other

## 2017-07-28 ENCOUNTER — Ambulatory Visit: Payer: Medicare Other

## 2017-07-28 ENCOUNTER — Ambulatory Visit: Payer: Medicare Other | Admitting: Nurse Practitioner

## 2017-08-03 ENCOUNTER — Telehealth: Payer: Self-pay | Admitting: *Deleted

## 2017-08-03 NOTE — Telephone Encounter (Signed)
Received referral for low dose lung cancer screening CT scan. Message left at phone number listed in EMR for patient to call me back to facilitate scheduling scan.  

## 2017-08-09 ENCOUNTER — Ambulatory Visit: Payer: Medicare Other | Admitting: Family Medicine

## 2017-08-23 ENCOUNTER — Ambulatory Visit: Payer: Self-pay | Admitting: Cardiovascular Disease

## 2017-08-23 NOTE — Progress Notes (Deleted)
Cardiology Office Note   Date:  08/23/2017   ID:  Kim Graham, DOB [birth year], MRN [id number]  PCP:  Dorcas Carrow, DO  Cardiologist:   Lorine Bears, MD   No chief complaint on file.     History of Present Illness: Kim Graham is a 66 y.o. female who was referred by Dr. Laural Benes for evaluation of an abnormal EKG. She has no prior cardiac history. She has known history of tobacco use and COPD.  She had a routine EKG performed which showed short PR with possible delta waves.    Past Medical History:  Diagnosis Date  . COPD (chronic obstructive pulmonary disease) (HCC)     No past surgical history on file.   Current Outpatient Prescriptions  Medication Sig Dispense Refill  . albuterol (PROVENTIL HFA;VENTOLIN HFA) 108 (90 Base) MCG/ACT inhaler Inhale 2 puffs into the lungs every 6 (six) hours as needed for wheezing or shortness of breath. 1 Inhaler 0  . METHADONE HCL PO Take 83 mg by mouth daily.    Marland Kitchen umeclidinium-vilanterol (ANORO ELLIPTA) 62.5-25 MCG/INH AEPB Inhale 1 puff into the lungs daily. 60 each 3   No current facility-administered medications for this visit.     Allergies:   Patient has no known allergies.    Social History:  The patient  reports that she has been smoking.  She has a 49.00 pack-year smoking history. She has never used smokeless tobacco. She reports that she drinks alcohol. She reports that she does not use drugs.   Family History:  The patient's ***family history includes Cancer in her father; Dementia in her maternal grandfather; Diabetes in her maternal aunt, maternal grandmother, and mother; Heart disease in her maternal grandfather.    ROS:  Please see the history of present illness.   Otherwise, review of systems are positive for {NONE DEFAULTED:18576::"none"}.   All other systems are reviewed and negative.    PHYSICAL EXAM: VS:  There were no vitals taken for this visit. , BMI There is no height or weight on file to  calculate BMI. GEN: Well nourished, well developed, in no acute distress  HEENT: normal  Neck: no JVD, carotid bruits, or masses Cardiac: ***RRR; no murmurs, rubs, or gallops,no edema  Respiratory:  clear to auscultation bilaterally, normal work of breathing GI: soft, nontender, nondistended, + BS MS: no deformity or atrophy  Skin: warm and dry, no rash Neuro:  Strength and sensation are intact Psych: euthymic mood, full affect   EKG:  EKG {ACTION; IS/IS NWG:[id number]} ordered today. The ekg ordered today demonstrates ***   Recent Labs: 06/16/2017: ALT 58; BUN 12; Creatinine, Ser 0.73; Hemoglobin 16.3; Platelets 180; Potassium 3.8; Sodium 142; TSH 1.960    Lipid Panel    Component Value Date/Time   CHOL 159 06/16/2017 1156   TRIG 96 06/16/2017 1156   HDL 57 06/16/2017 1156   LDLCALC 83 06/16/2017 1156      Wt Readings from Last 3 Encounters:  06/18/17 137 lb 3 oz (62.2 kg)  06/16/17 138 lb 1 oz (62.6 kg)      Other studies Reviewed: Additional studies/ records that were reviewed today include: ***. Review of the above records demonstrates: ***  No flowsheet data found.    ASSESSMENT AND PLAN:  1.  ***    Disposition:   FU with *** in {gen number 6-57:[id number]} {Days to years:10300}  Signed,  Lorine Bears, MD  08/23/2017 11:29 AM    [facility]

## 2017-08-27 ENCOUNTER — Encounter: Payer: Self-pay | Admitting: Family Medicine

## 2017-08-27 ENCOUNTER — Ambulatory Visit (INDEPENDENT_AMBULATORY_CARE_PROVIDER_SITE_OTHER): Payer: Medicare Other | Admitting: Family Medicine

## 2017-08-27 VITALS — BP 148/78 | HR 64 | Temp 98.3°F | Wt 140.1 lb

## 2017-08-27 DIAGNOSIS — J449 Chronic obstructive pulmonary disease, unspecified: Secondary | ICD-10-CM | POA: Diagnosis not present

## 2017-08-27 DIAGNOSIS — Z23 Encounter for immunization: Secondary | ICD-10-CM

## 2017-08-27 MED ORDER — ALBUTEROL SULFATE HFA 108 (90 BASE) MCG/ACT IN AERS: 2.0000 | INHALATION_SPRAY | Freq: Four times a day (QID) | RESPIRATORY_TRACT | 3 refills | Status: DC | PRN

## 2017-08-27 NOTE — Patient Instructions (Addendum)

## 2017-08-27 NOTE — Assessment & Plan Note (Signed)
Not under good control. Will try trelegy and see how she does- sample given. If doing well will continue on it. Check in by phone in 2 weeks. In person 6 weeks with spiro

## 2017-08-27 NOTE — Progress Notes (Signed)
BP (!) 148/78 (BP Location: Left Arm, Patient Position: Sitting, Cuff Size: Normal)   Pulse 64   Temp 98.3 F (36.8 C)   Wt 140 lb 1 oz (63.5 kg)   SpO2 94%   BMI 25.05 kg/m    Subjective:    Patient ID: Kim Graham, female    DOB: 18-Nov-1950, 66 y.o.   MRN: [id number]  HPI: Kim Graham is a 66 y.o. female  Chief Complaint  Patient presents with  . COPD    Patient states that she has been out of her albuterol inhaler for about a week   COPD- not noticing much on the anoro, albuterol helps a lot, breathing feels better in general then before she was on medicine.  COPD status: uncontrolled Satisfied with current treatment?: no Oxygen use: no Dyspnea frequency: 1x day Cough frequency: occasional Rescue inhaler frequency:  1-2x a day Limitation of activity: no Productive cough: none Last Spirometry: today Pneumovax: Up to Date Influenza: Given today  Relevant past medical, surgical, family and social history reviewed and updated as indicated. Interim medical history since our last visit reviewed. Allergies and medications reviewed and updated.  Review of Systems  Constitutional: Negative.   Respiratory: Positive for cough, chest tightness, shortness of breath and wheezing. Negative for apnea, choking and stridor.   Cardiovascular: Negative.   Psychiatric/Behavioral: Negative.     Per HPI unless specifically indicated above     Objective:    BP (!) 148/78 (BP Location: Left Arm, Patient Position: Sitting, Cuff Size: Normal)   Pulse 64   Temp 98.3 F (36.8 C)   Wt 140 lb 1 oz (63.5 kg)   SpO2 94%   BMI 25.05 kg/m   Wt Readings from Last 3 Encounters:  08/27/17 140 lb 1 oz (63.5 kg)  06/18/17 137 lb 3 oz (62.2 kg)  06/16/17 138 lb 1 oz (62.6 kg)    Physical Exam  Constitutional: She is oriented to person, place, and time. She appears well-developed and well-nourished. No distress.  HENT:  Head: Normocephalic and atraumatic.  Right Ear: Hearing normal.    Left Ear: Hearing normal.  Nose: Nose normal.  Eyes: Conjunctivae and lids are normal. Right eye exhibits no discharge. Left eye exhibits no discharge. No scleral icterus.  Cardiovascular: Normal rate, regular rhythm, normal heart sounds and intact distal pulses.  Exam reveals no gallop and no friction rub.   No murmur heard. Pulmonary/Chest: Effort normal and breath sounds normal. No respiratory distress. She has no wheezes. She has no rales. She exhibits no tenderness.  Musculoskeletal: Normal range of motion.  Neurological: She is alert and oriented to person, place, and time.  Skin: Skin is warm, dry and intact. No rash noted. She is not diaphoretic. No erythema. No pallor.  Psychiatric: She has a normal mood and affect. Her speech is normal and behavior is normal. Judgment and thought content normal. Cognition and memory are normal.  Nursing note and vitals reviewed.   Results for orders placed or performed in visit on 06/18/17  IGP, Aptima HPV, rfx 16/18,45  Result Value Ref Range   DIAGNOSIS: Comment    Specimen adequacy: Comment    Clinician Provided ICD10 Comment    Performed by: Comment    PAP Smear Comment .    Note: Comment    Test Methodology Comment    HPV Aptima Negative Negative      Assessment & Plan:   Problem List Items Addressed This Visit  Respiratory   Chronic obstructive pulmonary disease (HCC) - Primary    Not under good control. Will try trelegy and see how she does- sample given. If doing well will continue on it. Check in by phone in 2 weeks. In person 6 weeks with spiro      Relevant Medications   albuterol (PROVENTIL HFA;VENTOLIN HFA) 108 (90 Base) MCG/ACT inhaler   Other Relevant Orders   Spirometry with Graph (Completed)    Other Visit Diagnoses    Immunization due       Flu shot given today.   Relevant Orders   Flu vaccine HIGH DOSE PF (Fluzone High dose) (Completed)       Follow up plan: Return in about 6 weeks (around  10/08/2017) for follow up breathing with spiro.

## 2017-08-30 ENCOUNTER — Telehealth: Payer: Self-pay | Admitting: *Deleted

## 2017-08-30 NOTE — Telephone Encounter (Signed)
Received referral for low dose lung cancer screening CT scan. Message left at phone number listed in EMR for patient to call me back to facilitate scheduling scan.  

## 2017-09-06 ENCOUNTER — Telehealth: Payer: Self-pay | Admitting: Family Medicine

## 2017-09-06 MED ORDER — FLUTICASONE-UMECLIDIN-VILANT 100-62.5-25 MCG/INH IN AEPB: 1.0000 | INHALATION_SPRAY | Freq: Every day | RESPIRATORY_TRACT | 12 refills | Status: DC

## 2017-09-06 NOTE — Telephone Encounter (Signed)
She notes that she's doing great and is feeling better on the Trelegy. Would like Rx sent to her pharmacy. Rx sent.

## 2017-09-06 NOTE — Telephone Encounter (Signed)
-----   Message from Dorcas CarrowMegan P Ennis Delpozo, DO sent at 08/27/2017  4:18 PM EDT ----- Call to check in on how her breathing is doing on the Trelegy

## 2017-09-13 ENCOUNTER — Encounter: Payer: Self-pay | Admitting: Family Medicine

## 2017-09-28 ENCOUNTER — Encounter: Payer: Self-pay | Admitting: *Deleted

## 2017-10-07 ENCOUNTER — Ambulatory Visit: Payer: Medicare Other | Admitting: Family Medicine

## 2017-10-20 ENCOUNTER — Ambulatory Visit (INDEPENDENT_AMBULATORY_CARE_PROVIDER_SITE_OTHER): Payer: Medicare Other | Admitting: Family Medicine

## 2017-10-20 ENCOUNTER — Encounter: Payer: Self-pay | Admitting: Family Medicine

## 2017-10-20 VITALS — BP 132/78 | HR 103 | Temp 98.9°F | Wt 135.4 lb

## 2017-10-20 DIAGNOSIS — J449 Chronic obstructive pulmonary disease, unspecified: Secondary | ICD-10-CM | POA: Diagnosis not present

## 2017-10-20 MED ORDER — PREDNISONE 10 MG PO TABS: ORAL_TABLET | ORAL | 0 refills | Status: DC

## 2017-10-20 MED ORDER — HYDROCOD POLST-CPM POLST ER 10-8 MG/5ML PO SUER: 5.0000 mL | Freq: Two times a day (BID) | ORAL | 0 refills | Status: DC | PRN

## 2017-10-20 MED ORDER — DOXYCYCLINE HYCLATE 100 MG PO TABS: 100.0000 mg | ORAL_TABLET | Freq: Two times a day (BID) | ORAL | 0 refills | Status: DC

## 2017-10-20 NOTE — Progress Notes (Signed)
   BP 132/78 (BP Location: Left Arm, Patient Position: Sitting, Cuff Size: Normal)   Pulse (!) 103   Temp 98.9 F (37.2 C) (Oral)   Wt 135 lb 6.4 oz (61.4 kg)   SpO2 94%   BMI 24.22 kg/m    Subjective:    Patient ID: Kim Graham, female    DOB: Aug 16, 1951, 66 y.o.   MRN: [id number]  HPI: Kim MaudlinMartha L Graham is a 66 y.o. female  Chief Complaint  Patient presents with  . Cough    Symptoms been going on for over a week. Patient says once she starts coughing, she'll cough for 10 minutes unable to stop.   . Chest Pain  . Fatigue   Fatigue, chest soreness, cough with thick sputum, congestion, SOB, wheezing x over a week. Denies fever, chills, CP. Taking some advil prn as well as her trelegy and albuterol regimen. Several sick contacts lately. Trying hard to cut back/quit smoking.  Relevant past medical, surgical, family and social history reviewed and updated as indicated. Interim medical history since our last visit reviewed. Allergies and medications reviewed and updated.  Review of Systems  Constitutional: Positive for fatigue.  HENT: Positive for congestion.   Eyes: Negative.   Respiratory: Positive for cough, chest tightness and shortness of breath.   Cardiovascular: Negative.   Gastrointestinal: Negative.   Genitourinary: Negative.   Musculoskeletal: Negative.   Neurological: Negative.   Psychiatric/Behavioral: Negative.    Per HPI unless specifically indicated above     Objective:    BP 132/78 (BP Location: Left Arm, Patient Position: Sitting, Cuff Size: Normal)   Pulse (!) 103   Temp 98.9 F (37.2 C) (Oral)   Wt 135 lb 6.4 oz (61.4 kg)   SpO2 94%   BMI 24.22 kg/m   Wt Readings from Last 3 Encounters:  10/20/17 135 lb 6.4 oz (61.4 kg)  08/27/17 140 lb 1 oz (63.5 kg)  06/18/17 137 lb 3 oz (62.2 kg)    Physical Exam  Constitutional: She is oriented to person, place, and time. She appears well-developed and well-nourished. No distress.  HENT:  Head: Atraumatic.    Eyes: Conjunctivae are normal. Pupils are equal, round, and reactive to light. No scleral icterus.  Neck: Normal range of motion. Neck supple.  Cardiovascular: Normal rate and normal heart sounds.  Pulmonary/Chest: Effort normal. No respiratory distress. She has wheezes. She has no rales.  Musculoskeletal: Normal range of motion.  Lymphadenopathy:    She has no cervical adenopathy.  Neurological: She is alert and oriented to person, place, and time.  Skin: Skin is warm and dry.  Psychiatric: She has a normal mood and affect. Her behavior is normal.  Nursing note and vitals reviewed.     Assessment & Plan:   Problem List Items Addressed This Visit      Respiratory   Chronic obstructive pulmonary disease (HCC) - Primary    Will treat exacerbation with doxycycline, prednisone taper, and tussionex. Continue home inhaler regimen. Continue working on smoking cessation.       Relevant Medications   predniSONE (DELTASONE) 10 MG tablet   chlorpheniramine-HYDROcodone (TUSSIONEX PENNKINETIC ER) 10-8 MG/5ML SUER      Follow up plan: Return if symptoms worsen or fail to improve.

## 2017-10-21 NOTE — Patient Instructions (Signed)
Follow up as needed

## 2017-10-21 NOTE — Assessment & Plan Note (Signed)
Will treat exacerbation with doxycycline, prednisone taper, and tussionex. Continue home inhaler regimen. Continue working on smoking cessation.

## 2017-10-28 ENCOUNTER — Ambulatory Visit: Payer: Medicare Other | Admitting: Family Medicine

## 2018-02-16 ENCOUNTER — Ambulatory Visit (INDEPENDENT_AMBULATORY_CARE_PROVIDER_SITE_OTHER): Payer: Medicare Other | Admitting: Family Medicine

## 2018-02-16 ENCOUNTER — Encounter: Payer: Self-pay | Admitting: Family Medicine

## 2018-02-16 VITALS — BP 160/77 | HR 80 | Temp 98.3°F | Wt 130.2 lb

## 2018-02-16 DIAGNOSIS — F4381 Prolonged grief disorder: Secondary | ICD-10-CM

## 2018-02-16 DIAGNOSIS — Z634 Disappearance and death of family member: Secondary | ICD-10-CM | POA: Diagnosis not present

## 2018-02-16 DIAGNOSIS — F4321 Adjustment disorder with depressed mood: Secondary | ICD-10-CM

## 2018-02-16 DIAGNOSIS — F4329 Adjustment disorder with other symptoms: Secondary | ICD-10-CM | POA: Diagnosis not present

## 2018-02-16 MED ORDER — ARIPIPRAZOLE 10 MG PO TBDP: ORAL_TABLET | ORAL | 3 refills | Status: DC

## 2018-02-16 NOTE — Progress Notes (Signed)
BP (!) 160/77 (BP Location: Left Arm, Patient Position: Sitting, Cuff Size: Normal)   Pulse 80   Temp 98.3 F (36.8 C)   Wt 130 lb 4 oz (59.1 kg)   SpO2 93%   BMI 23.29 kg/m    Subjective:    Patient ID: Kim Graham, female    DOB: Nov 27, 1950, 67 y.o.   MRN: [id number]  HPI: Latanya MaudlinMartha L Hammack is a 67 y.o. female  Chief Complaint  Patient presents with  . Depression   DEPRESSION- [place] Had a lot of trouble when her mom died in the past, went on abilify and did well with that. Otherwise hanging in there.  Mood status: uncontrolled Satisfied with current treatment?: no Symptom severity: severe  Duration of current treatment : has been off meds Psychotherapy/counseling: no  Previous psychiatric medications: abilify Depressed mood: yes Anxious mood: yes Anhedonia: yes Significant weight loss or gain: no Insomnia: yes hard to stay asleep Fatigue: yes Feelings of worthlessness or guilt: yes Impaired concentration/indecisiveness: no Suicidal ideations: no Hopelessness: no Crying spells: yes Depression screen [facility] 2[facility] 02/16/2018 06/18/2017 06/16/2017  Decreased Interest 3 0 0  Down, Depressed, Hopeless 3 0 0  PHQ - 2 Score 6 0 0  Altered sleeping 3 - -  Tired, decreased energy 3 - -  Change in appetite 2 - -  Feeling bad or failure about yourself  2 - -  Trouble concentrating 2 - -  Moving slowly or fidgety/restless 0 - -  Suicidal thoughts 1 - -  PHQ-9 Score 19 - -  Difficult doing work/chores Very difficult - -   GAD 7 : Generalized Anxiety Score 02/16/2018  Nervous, Anxious, on Edge 2  Control/stop worrying 2  Worry too much - different things 2  Trouble relaxing 1  Restless 0  Easily annoyed or irritable 1  Afraid - awful might happen 1  Total GAD 7 Score 9  Anxiety Difficulty Somewhat difficult   Relevant past medical, surgical, family and social history reviewed and updated as indicated. Interim medical history since our last visit  reviewed. Allergies and medications reviewed and updated.  Review of Systems  Constitutional: Negative.   Respiratory: Negative.   Cardiovascular: Negative.   Psychiatric/Behavioral: Positive for dysphoric mood and sleep disturbance. Negative for agitation, behavioral problems, confusion, decreased concentration, hallucinations, Parkin-injury and suicidal ideas. The patient is nervous/anxious. The patient is not hyperactive.     Per HPI unless specifically indicated above     Objective:    BP (!) 160/77 (BP Location: Left Arm, Patient Position: Sitting, Cuff Size: Normal)   Pulse 80   Temp 98.3 F (36.8 C)   Wt 130 lb 4 oz (59.1 kg)   SpO2 93%   BMI 23.29 kg/m   Wt Readings from Last 3 Encounters:  02/16/18 130 lb 4 oz (59.1 kg)  10/20/17 135 lb 6.4 oz (61.4 kg)  08/27/17 140 lb 1 oz (63.5 kg)    Physical Exam  Constitutional: She is oriented to person, place, and time. She appears well-developed and well-nourished. No distress.  HENT:  Head: Normocephalic and atraumatic.  Right Ear: Hearing normal.  Left Ear: Hearing normal.  Nose: Nose normal.  Eyes: Conjunctivae and lids are normal. Right eye exhibits no discharge. Left eye exhibits no discharge. No scleral icterus.  Cardiovascular: Normal rate, regular rhythm, normal heart sounds and intact distal pulses. Exam reveals no gallop and no friction rub.  No murmur heard. Pulmonary/Chest: Effort normal and breath  sounds normal. No stridor. No respiratory distress. She has no wheezes. She has no rales. She exhibits no tenderness.  Musculoskeletal: Normal range of motion.  Neurological: She is alert and oriented to person, place, and time.  Skin: Skin is warm, dry and intact. Capillary refill takes less than 2 seconds. No rash noted. She is not diaphoretic. No erythema. No pallor.  Psychiatric: Her speech is normal and behavior is normal. Judgment and thought content normal. Cognition and memory are normal. She exhibits a  depressed mood.  Nursing note and vitals reviewed.   Results for orders placed or performed in visit on 06/18/17  IGP, Aptima HPV, rfx 16/18,45  Result Value Ref Range   DIAGNOSIS: Comment    Specimen adequacy: Comment    Clinician Provided ICD10 Comment    Performed by: Comment    PAP Smear Comment .    Note: Comment    Test Methodology Comment    HPV Aptima Negative Negative      Assessment & Plan:   Problem List Items Addressed This Visit    None    Visit Diagnoses    Complicated grief    -  Primary   Will start abilify as that is what she was on previously. Recheck 2 weeks. Call with any concerns.        Follow up plan: Return in about 2 weeks (around 03/02/2018) for Follow up mood.

## 2018-03-03 ENCOUNTER — Encounter: Payer: Self-pay | Admitting: Family Medicine

## 2018-03-03 ENCOUNTER — Ambulatory Visit (INDEPENDENT_AMBULATORY_CARE_PROVIDER_SITE_OTHER): Payer: Medicare Other | Admitting: Family Medicine

## 2018-03-03 VITALS — BP 131/68 | HR 93 | Temp 98.6°F | Wt 127.0 lb

## 2018-03-03 DIAGNOSIS — J441 Chronic obstructive pulmonary disease with (acute) exacerbation: Secondary | ICD-10-CM | POA: Diagnosis not present

## 2018-03-03 DIAGNOSIS — Z634 Disappearance and death of family member: Secondary | ICD-10-CM

## 2018-03-03 DIAGNOSIS — F4321 Adjustment disorder with depressed mood: Secondary | ICD-10-CM

## 2018-03-03 DIAGNOSIS — F4329 Adjustment disorder with other symptoms: Secondary | ICD-10-CM | POA: Diagnosis not present

## 2018-03-03 MED ORDER — PREDNISONE 10 MG PO TABS: ORAL_TABLET | ORAL | 0 refills | Status: DC

## 2018-03-03 MED ORDER — AZITHROMYCIN 250 MG PO TABS: ORAL_TABLET | ORAL | 0 refills | Status: DC

## 2018-03-03 MED ORDER — BENZONATATE 200 MG PO CAPS: 200.0000 mg | ORAL_CAPSULE | Freq: Two times a day (BID) | ORAL | 0 refills | Status: DC | PRN

## 2018-03-03 NOTE — Progress Notes (Signed)
BP 131/68 (BP Location: Left Arm, Patient Position: Sitting, Cuff Size: Normal)   Pulse 93   Temp 98.6 F (37 C)   Wt 127 lb (57.6 kg)   SpO2 96%   BMI 22.71 kg/m    Subjective:    Patient ID: Kim Graham, female    DOB: 03-Apr-1951, 67 y.o.   MRN: [id number]  HPI: Kim Graham is a 67 y.o. female  Chief Complaint  Patient presents with  . Depression  . Allergies   DEPRESSION- was making her sleepy with the half a pill. She is not sleepy any more. She notes that her friends are noticing a difference Mood status: stable Satisfied with current treatment?: yes Symptom severity: moderate  Duration of current treatment : 2 weeks Side effects: no Medication compliance: excellent compliance Psychotherapy/counseling: no  Previous psychiatric medications: abilifty Depressed mood: yes Anxious mood: yes Anhedonia: no Significant weight loss or gain: no Insomnia: yes hard to fall asleep Fatigue: yes Feelings of worthlessness or guilt: yes Impaired concentration/indecisiveness: yes Suicidal ideations: no Hopelessness: yes Crying spells: yes Depression screen [facility] 2/9 03/03/2018 02/16/2018 06/18/2017 06/16/2017  Decreased Interest 2 3 0 0  Down, Depressed, Hopeless 1 3 0 0  PHQ - 2 Score 3 6 0 0  Altered sleeping 1 3 - -  Tired, decreased energy 3 3 - -  Change in appetite 0 2 - -  Feeling bad or failure about yourself  1 2 - -  Trouble concentrating 1 2 - -  Moving slowly or fidgety/restless 0 0 - -  Suicidal thoughts 0 1 - -  PHQ-9 Score 9 19 - -  Difficult doing work/chores Somewhat difficult Very difficult - -   UPPER RESPIRATORY TRACT INFECTION Duration: 1-2+ weeks Worst symptom: congestion Fever: no Cough: yes Shortness of breath: no Wheezing: no Chest pain: no Chest tightness: no Chest congestion: no Nasal congestion: yes Runny nose: yes Post nasal drip: no Sneezing: yes Sore throat: yes Swollen glands: no Sinus pressure: yes Headache: no Face pain:  no Toothache: no Ear pain: no  Ear pressure: no  Eyes red/itching:no Eye drainage/crusting: no  Vomiting: no Rash: no Fatigue: yes Sick contacts: no Strep contacts: no  Context: worse Recurrent sinusitis: no Relief with OTC cold/cough medications: no  Treatments attempted: cold/sinus, anti-histamine and pseudoephedrine    Relevant past medical, surgical, family and social history reviewed and updated as indicated. Interim medical history since our last visit reviewed. Allergies and medications reviewed and updated.  Review of Systems  Constitutional: Positive for fatigue and fever. Negative for activity change, appetite change, chills, diaphoresis and unexpected weight change.  HENT: Positive for congestion, postnasal drip, rhinorrhea and sinus pressure. Negative for dental problem, drooling, ear discharge, ear pain, facial swelling, hearing loss, mouth sores, nosebleeds, sinus pain, sneezing, sore throat, tinnitus, trouble swallowing and voice change.   Eyes: Negative.   Respiratory: Positive for cough and wheezing. Negative for apnea, choking, chest tightness, shortness of breath and stridor.   Cardiovascular: Negative.   Neurological: Negative.   Psychiatric/Behavioral: Negative.     Per HPI unless specifically indicated above     Objective:    BP 131/68 (BP Location: Left Arm, Patient Position: Sitting, Cuff Size: Normal)   Pulse 93   Temp 98.6 F (37 C)   Wt 127 lb (57.6 kg)   SpO2 96%   BMI 22.71 kg/m   Wt Readings from Last 3 Encounters:  03/03/18 127 lb (57.6 kg)  02/16/18 130 lb 4  oz (59.1 kg)  10/20/17 135 lb 6.4 oz (61.4 kg)    Physical Exam  Constitutional: She is oriented to person, place, and time. She appears well-developed and well-nourished. No distress.  HENT:  Head: Normocephalic and atraumatic.  Right Ear: Hearing normal.  Left Ear: Hearing normal.  Nose: Nose normal.  Eyes: Conjunctivae and lids are normal. Right eye exhibits no discharge.  Left eye exhibits no discharge. No scleral icterus.  Cardiovascular: Normal rate, regular rhythm, normal heart sounds and intact distal pulses. Exam reveals no gallop and no friction rub.  No murmur heard. Pulmonary/Chest: Effort normal. No stridor. No respiratory distress. She has wheezes in the right upper field, the right middle field, the right lower field, the left upper field, the left middle field and the left lower field. She has rhonchi in the right upper field, the right middle field, the right lower field, the left upper field, the left middle field and the left lower field. She has no rales. She exhibits no tenderness.  Musculoskeletal: Normal range of motion.  Neurological: She is alert and oriented to person, place, and time.  Skin: Skin is warm, dry and intact. Capillary refill takes less than 2 seconds. No rash noted. She is not diaphoretic. No erythema. No pallor.  Psychiatric: She has a normal mood and affect. Her speech is normal and behavior is normal. Judgment and thought content normal. Cognition and memory are normal.    Results for orders placed or performed in visit on 06/18/17  IGP, Aptima HPV, rfx 16/18,45  Result Value Ref Range   DIAGNOSIS: Comment    Specimen adequacy: Comment    Clinician Provided ICD10 Comment    Performed by: Comment    PAP Smear Comment .    Note: Comment    Test Methodology Comment    HPV Aptima Negative Negative      Assessment & Plan:   Problem List Items Addressed This Visit      Respiratory   Chronic obstructive pulmonary disease (HCC)   Relevant Medications   predniSONE (DELTASONE) 10 MG tablet   azithromycin (ZITHROMAX) 250 MG tablet   benzonatate (TESSALON) 200 MG capsule    Other Visit Diagnoses    Complicated grief    -  Primary   Improved on the abilfy. Will recheck 2 weeks. Continue current regimen. Continue to monitor.        Follow up plan: Return in about 2 weeks (around 03/17/2018) for Follow up mood and  lungs.

## 2018-03-18 ENCOUNTER — Ambulatory Visit (INDEPENDENT_AMBULATORY_CARE_PROVIDER_SITE_OTHER): Payer: Medicare Other | Admitting: Family Medicine

## 2018-03-18 ENCOUNTER — Encounter: Payer: Self-pay | Admitting: Family Medicine

## 2018-03-18 VITALS — BP 157/77 | HR 69 | Temp 98.2°F | Wt 126.5 lb

## 2018-03-18 DIAGNOSIS — F4329 Adjustment disorder with other symptoms: Secondary | ICD-10-CM

## 2018-03-18 DIAGNOSIS — Z634 Disappearance and death of family member: Secondary | ICD-10-CM | POA: Diagnosis not present

## 2018-03-18 DIAGNOSIS — F4321 Adjustment disorder with depressed mood: Secondary | ICD-10-CM

## 2018-03-18 DIAGNOSIS — J449 Chronic obstructive pulmonary disease, unspecified: Secondary | ICD-10-CM | POA: Diagnosis not present

## 2018-03-18 DIAGNOSIS — F339 Major depressive disorder, recurrent, unspecified: Secondary | ICD-10-CM | POA: Insufficient documentation

## 2018-03-18 MED ORDER — ALBUTEROL SULFATE HFA 108 (90 BASE) MCG/ACT IN AERS: 2.0000 | INHALATION_SPRAY | Freq: Four times a day (QID) | RESPIRATORY_TRACT | 3 refills | Status: DC | PRN

## 2018-03-18 MED ORDER — ARIPIPRAZOLE 10 MG PO TBDP: 10.0000 mg | ORAL_TABLET | Freq: Every day | ORAL | 1 refills | Status: DC

## 2018-03-18 MED ORDER — ANORO ELLIPTA 62.5-25 MCG/INH IN AEPB: 1.0000 | INHALATION_SPRAY | Freq: Every day | RESPIRATORY_TRACT | 6 refills | Status: DC

## 2018-03-18 NOTE — Assessment & Plan Note (Addendum)
Doing much better. Stable. Call with any concerns. Continue to monitor. Refills given today.

## 2018-03-18 NOTE — Assessment & Plan Note (Addendum)
Doing much better. Continue current regimen. Continue to monitor. Call with any concerns. Refills given today. Recheck 6 months.

## 2018-03-18 NOTE — Progress Notes (Signed)
BP (!) 157/77   Pulse 69   Temp 98.2 F (36.8 C) (Oral)   Wt 126 lb 8 oz (57.4 kg)   SpO2 96%   BMI 22.62 kg/m    Subjective:    Patient ID: Kim Graham, female    DOB: 1950-11-23, 67 y.o.   MRN: [id number]  HPI: Kim Graham is a 67 y.o. female  Chief Complaint  Patient presents with  . Depression  . COPD   DEPRESSION Mood status: better Satisfied with current treatment?: yes Symptom severity: mild  Duration of current treatment : months Side effects: no Medication compliance: excellent compliance Psychotherapy/counseling: no  Previous psychiatric medications: abilify Depressed mood: no Anxious mood: no Anhedonia: no Significant weight loss or gain: no Insomnia: no  Fatigue: no Feelings of worthlessness or guilt: no Impaired concentration/indecisiveness: no Suicidal ideations: no Hopelessness: no Crying spells: no Depression screen [facility] 2/9 03/18/2018 03/18/2018 03/03/2018 02/16/2018 06/18/2017  Decreased Interest 0  Down, Depressed, Hopeless 0 0 1 3 0  PHQ - 2 Score 0  Altered sleeping -  Tired, decreased energy -  Change in appetite 1 1 0 2 -  Feeling bad or failure about yourself  0 0 1 2 -  Trouble concentrating 0 0 1 2 -  Moving slowly or fidgety/restless 0 0 0 0 -  Suicidal thoughts 0 0 0 1 -  PHQ-9 Score -  Difficult doing work/chores Somewhat difficult Not difficult at all Somewhat difficult Very difficult -   COPD COPD status: better Satisfied with current treatment?: yes Oxygen use: no Dyspnea frequency: none Cough frequency: couple times a day Rescue inhaler frequency:  rarely Limitation of activity: no Productive cough: no Pneumovax: Up to Date Influenza: Up to Date  Relevant past medical, surgical, family and social history reviewed and updated as indicated. Interim medical history since our last visit reviewed. Allergies and medications reviewed and updated.  Review of Systems    Constitutional: Negative.   HENT: Negative.   Respiratory: Negative.   Cardiovascular: Negative.   Psychiatric/Behavioral: Negative.     Per HPI unless specifically indicated above     Objective:    BP (!) 157/77   Pulse 69   Temp 98.2 F (36.8 C) (Oral)   Wt 126 lb 8 oz (57.4 kg)   SpO2 96%   BMI 22.62 kg/m   Wt Readings from Last 3 Encounters:  03/18/18 126 lb 8 oz (57.4 kg)  03/03/18 127 lb (57.6 kg)  02/16/18 130 lb 4 oz (59.1 kg)    Physical Exam  Constitutional: She is oriented to person, place, and time. She appears well-developed and well-nourished. No distress.  HENT:  Head: Normocephalic and atraumatic.  Right Ear: Hearing normal.  Left Ear: Hearing normal.  Nose: Nose normal.  Eyes: Conjunctivae and lids are normal. Right eye exhibits no discharge. Left eye exhibits no discharge. No scleral icterus.  Cardiovascular: Normal rate, regular rhythm, normal heart sounds and intact distal pulses. Exam reveals no gallop and no friction rub.  No murmur heard. Pulmonary/Chest: Effort normal and breath sounds normal. No stridor. No respiratory distress. She has no wheezes. She has no rales. She exhibits no tenderness.  Musculoskeletal: Normal range of motion.  Neurological: She is alert and oriented to person, place, and time.  Skin: Skin is warm, dry and intact. Capillary refill takes less than 2 seconds. No rash  noted. She is not diaphoretic. No erythema. No pallor.  Psychiatric: She has a normal mood and affect. Her speech is normal and behavior is normal. Judgment and thought content normal. Cognition and memory are normal.  Nursing note and vitals reviewed.   Results for orders placed or performed in visit on 06/18/17  IGP, Aptima HPV, rfx 16/18,45  Result Value Ref Range   DIAGNOSIS: Comment    Specimen adequacy: Comment    Clinician Provided ICD10 Comment    Performed by: Comment    PAP Smear Comment .    Note: Comment    Test Methodology Comment    HPV  Aptima Negative Negative      Assessment & Plan:   Problem List Items Addressed This Visit      Respiratory   Chronic obstructive pulmonary disease (HCC)    Doing much better. Continue current regimen. Continue to monitor. Call with any concerns. Refills given today. Recheck 6 months.       Relevant Medications   albuterol (PROVENTIL HFA;VENTOLIN HFA) 108 (90 Base) MCG/ACT inhaler   ANORO ELLIPTA 62.5-25 MCG/INH AEPB     Other   Complicated grief - Primary    Doing much better. Stable. Call with any concerns. Continue to monitor. Refills given today.          Follow up plan: Return in about 6 months (around 09/18/2018) for wellness and physical.

## 2018-04-07 ENCOUNTER — Telehealth: Payer: Self-pay | Admitting: Family Medicine

## 2018-04-07 DIAGNOSIS — Z111 Encounter for screening for respiratory tuberculosis: Secondary | ICD-10-CM

## 2018-04-07 NOTE — Telephone Encounter (Signed)
Patient would like an order to be put in for her to have TB test done for a job  Thank you

## 2018-04-07 NOTE — Telephone Encounter (Signed)
Routing to provider  

## 2018-04-08 NOTE — Telephone Encounter (Signed)
Order in. OK to get it drawn whenever she'd like.

## 2018-04-08 NOTE — Addendum Note (Signed)
Addended by: Dorcas Carrow on: 04/08/2018 11:48 AM   Modules accepted: Orders

## 2018-04-11 ENCOUNTER — Other Ambulatory Visit: Payer: Medicare Other

## 2018-04-11 ENCOUNTER — Ambulatory Visit: Payer: Medicare Other | Admitting: Unknown Physician Specialty

## 2018-04-11 DIAGNOSIS — Z111 Encounter for screening for respiratory tuberculosis: Secondary | ICD-10-CM

## 2018-04-13 DIAGNOSIS — Z79891 Long term (current) use of opiate analgesic: Secondary | ICD-10-CM | POA: Diagnosis not present

## 2018-04-15 ENCOUNTER — Ambulatory Visit (INDEPENDENT_AMBULATORY_CARE_PROVIDER_SITE_OTHER): Payer: Medicare Other | Admitting: Family Medicine

## 2018-04-15 ENCOUNTER — Encounter: Payer: Self-pay | Admitting: Family Medicine

## 2018-04-15 VITALS — BP 136/85 | HR 94 | Temp 98.4°F | Wt 121.1 lb

## 2018-04-15 DIAGNOSIS — F4329 Adjustment disorder with other symptoms: Secondary | ICD-10-CM

## 2018-04-15 DIAGNOSIS — F4321 Adjustment disorder with depressed mood: Secondary | ICD-10-CM

## 2018-04-15 DIAGNOSIS — F4381 Prolonged grief disorder: Secondary | ICD-10-CM

## 2018-04-15 DIAGNOSIS — Z634 Disappearance and death of family member: Secondary | ICD-10-CM

## 2018-04-15 DIAGNOSIS — E119 Type 2 diabetes mellitus without complications: Secondary | ICD-10-CM

## 2018-04-15 DIAGNOSIS — R634 Abnormal weight loss: Secondary | ICD-10-CM | POA: Diagnosis not present

## 2018-04-15 HISTORY — DX: Type 2 diabetes mellitus without complications: E11.9

## 2018-04-15 LAB — MICROALBUMIN, URINE WAIVED
Creatinine, Urine Waived: 50 mg/dL (ref 10–300)
MICROALB, UR WAIVED: 30 mg/L — AB (ref 0–19)

## 2018-04-15 LAB — BAYER DCA HB A1C WAIVED: HB A1C: 6.7 % (ref <7.0)

## 2018-04-15 MED ORDER — ARIPIPRAZOLE 15 MG PO TBDP: 15.0000 mg | ORAL_TABLET | Freq: Every day | ORAL | 1 refills | Status: DC

## 2018-04-15 NOTE — Assessment & Plan Note (Signed)
Not under good control. Will increase her abilify to 15mg  and recheck 1 month.

## 2018-04-15 NOTE — Assessment & Plan Note (Signed)
Newly diagnosed with A1c of 6.7. 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 Will hold on medicine. Continue to monitor. Call with any concerns.

## 2018-04-15 NOTE — Progress Notes (Signed)
BP 136/85 (BP Location: Left Arm, Patient Position: Sitting, Cuff Size: Normal)   Pulse 94   Temp 98.4 F (36.9 C)   Wt 121 lb 2 oz (54.9 kg)   SpO2 96%   BMI 21.66 kg/m    Subjective:    Patient ID: Kim Graham, female    DOB: [birth year], 67 y.o.   MRN: [id number]  HPI: Kim Graham is a 67 y.o. female  Chief Complaint  Patient presents with  . Weight Loss   WEIGHT LOSS Duration: In the last year Amount of weight loss: 15lbs Fevers: no Decreased appetite: yes Night sweats: no Dysphagia/odynophagia: no Chest pain: no Shortness of breath: no Cough: no Nausea: no Vomiting: no Abdominal pain: no Blood in stool: no Easy bruising/bleeding: no Jaundice: no Polydipsia/polyuria: no Depression: yes Previous colonoscopy: no   Relevant past medical, surgical, family and social history reviewed and updated as indicated. Interim medical history since our last visit reviewed. Allergies and medications reviewed and updated.  Review of Systems  Constitutional: Negative.   Respiratory: Negative.   Cardiovascular: Negative.   Psychiatric/Behavioral: Negative.     Per HPI unless specifically indicated above     Objective:    BP 136/85 (BP Location: Left Arm, Patient Position: Sitting, Cuff Size: Normal)   Pulse 94   Temp 98.4 F (36.9 C)   Wt 121 lb 2 oz (54.9 kg)   SpO2 96%   BMI 21.66 kg/m   Wt Readings from Last 3 Encounters:  04/15/18 121 lb 2 oz (54.9 kg)  03/18/18 126 lb 8 oz (57.4 kg)  03/03/18 127 lb (57.6 kg)    Physical Exam  Constitutional: She is oriented to person, place, and time. She appears well-developed and well-nourished. No distress.  HENT:  Head: Normocephalic and atraumatic.  Right Ear: Hearing normal.  Left Ear: Hearing normal.  Nose: Nose normal.  Eyes: Conjunctivae and lids are normal. Right eye exhibits no discharge. Left eye exhibits no discharge. No scleral icterus.  Cardiovascular: Normal rate, regular rhythm, normal heart  sounds and intact distal pulses. Exam reveals no gallop and no friction rub.  No murmur heard. Pulmonary/Chest: Effort normal and breath sounds normal. No stridor. No respiratory distress. She has no wheezes. She has no rales. She exhibits no tenderness.  Musculoskeletal: Normal range of motion.  Neurological: She is alert and oriented to person, place, and time.  Skin: Skin is warm, dry and intact. Capillary refill takes less than 2 seconds. No rash noted. She is not diaphoretic. No erythema. No pallor.  Psychiatric: She has a normal mood and affect. Her speech is normal and behavior is normal. Judgment and thought content normal. Cognition and memory are normal.  Nursing note and vitals reviewed.   Results for orders placed or performed in visit on 04/11/18  QuantiFERON-TB Gold Plus  Result Value Ref Range   QuantiFERON Incubation WILL FOLLOW    QuantiFERON Criteria WILL FOLLOW    QuantiFERON TB1 Ag Value WILL FOLLOW    QuantiFERON TB2 Ag Value WILL FOLLOW    QuantiFERON Nil Value WILL FOLLOW    QuantiFERON Mitogen Value WILL FOLLOW    QuantiFERON-TB Gold Plus WILL FOLLOW       Assessment & Plan:   Problem List Items Addressed This Visit      Endocrine   Diabetes mellitus type 2, diet-controlled (HCC)    Newly diagnosed with A1c of 6.7. 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 Will  hold on medicine. Continue to monitor. Call with any concerns.       Relevant Orders   Ambulatory referral to Ophthalmology   Microalbumin, Urine Waived     Other   Complicated grief    Not under good control. Will increase her abilify to 15mg  and recheck 1 month.        Other Visit Diagnoses    Weight loss    -  Primary   Likely due to depression. Continue to monitor. Checking labs. Call with any concerns.    Relevant Orders   Bayer DCA Hb A1c Waived   CBC with Differential/Platelet   TSH   Comprehensive metabolic panel       Follow up plan: Return in  about 1 month (around 05/13/2018) for follow up mood.

## 2018-04-16 LAB — CBC WITH DIFFERENTIAL/PLATELET
Basophils Absolute: 0 10*3/uL (ref 0.0–0.2)
Basos: 0 %
EOS (ABSOLUTE): 0.1 10*3/uL (ref 0.0–0.4)
Eos: 2 %
Hematocrit: 45.1 % (ref 34.0–46.6)
Hemoglobin: 15.4 g/dL (ref 11.1–15.9)
Immature Grans (Abs): 0 10*3/uL (ref 0.0–0.1)
Immature Granulocytes: 0 %
Lymphocytes Absolute: 1.9 10*3/uL (ref 0.7–3.1)
Lymphs: 27 %
MCH: 32 pg (ref 26.6–33.0)
MCHC: 34.1 g/dL (ref 31.5–35.7)
MCV: 94 fL (ref 79–97)
Monocytes Absolute: 0.6 10*3/uL (ref 0.1–0.9)
Monocytes: 9 %
Neutrophils Absolute: 4.3 10*3/uL (ref 1.4–7.0)
Neutrophils: 62 %
Platelets: 145 10*3/uL — ABNORMAL LOW (ref 150–450)
RBC: 4.82 x10E6/uL (ref 3.77–5.28)
RDW: 13.6 % (ref 12.3–15.4)
WBC: 7 10*3/uL (ref 3.4–10.8)

## 2018-04-16 LAB — QUANTIFERON-TB GOLD PLUS
QuantiFERON Mitogen Value: >10 IU/mL
QuantiFERON Nil Value: 0.05 IU/mL
QuantiFERON TB1 Ag Value: 0.04 IU/mL
QuantiFERON TB2 Ag Value: 0.03 IU/mL
QuantiFERON-TB Gold Plus: NEGATIVE

## 2018-04-16 LAB — COMPREHENSIVE METABOLIC PANEL
A/G RATIO: 1.6 (ref 1.2–2.2)
ALBUMIN: 4.1 g/dL (ref 3.6–4.8)
ALT: 31 IU/L (ref 0–32)
AST: 39 IU/L (ref 0–40)
Alkaline Phosphatase: 89 IU/L (ref 39–117)
BUN / CREAT RATIO: 14 (ref 12–28)
BUN: 11 mg/dL (ref 8–27)
Bilirubin Total: 0.5 mg/dL (ref 0.0–1.2)
CALCIUM: 9.6 mg/dL (ref 8.7–10.3)
CO2: 23 mmol/L (ref 20–29)
Chloride: 100 mmol/L (ref 96–106)
Creatinine, Ser: 0.77 mg/dL (ref 0.57–1.00)
GFR, EST AFRICAN AMERICAN: 93 mL/min/{1.73_m2} (ref >59)
GFR, EST NON AFRICAN AMERICAN: 81 mL/min/{1.73_m2} (ref >59)
GLOBULIN, TOTAL: 2.5 g/dL (ref 1.5–4.5)
Glucose: 104 mg/dL — ABNORMAL HIGH (ref 65–99)
Potassium: 4 mmol/L (ref 3.5–5.2)
SODIUM: 141 mmol/L (ref 134–144)
TOTAL PROTEIN: 6.6 g/dL (ref 6.0–8.5)

## 2018-04-16 LAB — TSH: TSH: 2.26 u[IU]/mL (ref 0.450–4.500)

## 2018-04-18 ENCOUNTER — Encounter: Payer: Self-pay | Admitting: Family Medicine

## 2018-05-17 ENCOUNTER — Ambulatory Visit: Payer: Medicare Other | Admitting: Family Medicine

## 2018-05-20 ENCOUNTER — Encounter: Payer: Self-pay | Admitting: Family Medicine

## 2018-05-20 ENCOUNTER — Ambulatory Visit (INDEPENDENT_AMBULATORY_CARE_PROVIDER_SITE_OTHER): Payer: Medicare Other | Admitting: Family Medicine

## 2018-05-20 VITALS — BP 116/77 | HR 111 | Temp 98.4°F | Wt 119.0 lb

## 2018-05-20 DIAGNOSIS — J441 Chronic obstructive pulmonary disease with (acute) exacerbation: Secondary | ICD-10-CM | POA: Diagnosis not present

## 2018-05-20 DIAGNOSIS — R634 Abnormal weight loss: Secondary | ICD-10-CM

## 2018-05-20 DIAGNOSIS — F4321 Adjustment disorder with depressed mood: Secondary | ICD-10-CM

## 2018-05-20 DIAGNOSIS — Z634 Disappearance and death of family member: Secondary | ICD-10-CM | POA: Diagnosis not present

## 2018-05-20 DIAGNOSIS — F4329 Adjustment disorder with other symptoms: Secondary | ICD-10-CM

## 2018-05-20 MED ORDER — ALBUTEROL SULFATE (2.5 MG/3ML) 0.083% IN NEBU
2.5000 mg | INHALATION_SOLUTION | Freq: Once | RESPIRATORY_TRACT | Status: AC
  Administered 2018-05-20: 2.5 mg via RESPIRATORY_TRACT

## 2018-05-20 NOTE — Assessment & Plan Note (Signed)
Better following neb. Will change to symbicort and use albuterol 2-3x a day, if not better in the next week, will treat with abx and prednisone. Call with any concerns. Ordering CT scan.

## 2018-05-20 NOTE — Assessment & Plan Note (Signed)
Doing much better on the 15mg - continue current regimen. Continue to monitor.

## 2018-05-20 NOTE — Progress Notes (Signed)
BP 116/77   Pulse (!) 111   Temp 98.4 F (36.9 C) (Oral)   Wt 119 lb (54 kg)   SpO2 98%   BMI 21.28 kg/m    Subjective:    Patient ID: Kim Graham, female    DOB: [birth year], 67 y.o.   MRN: [id number]  HPI: Kim Graham is a 67 y.o. female  Chief Complaint  Patient presents with  . Depression   DEPRESSION Mood status: better Satisfied with current treatment?: yes Symptom severity: mild  Duration of current treatment : chronic Side effects: no Medication compliance: excellent compliance Psychotherapy/counseling: no  Previous psychiatric medications: abilify Depressed mood: yes Anxious mood: yes Anhedonia: no Significant weight loss or gain: no Insomnia: no  Fatigue: yes Feelings of worthlessness or guilt: no Impaired concentration/indecisiveness: no Suicidal ideations: no Hopelessness: no Crying spells: yes Depression screen [facility] 2/9 05/20/2018 03/18/2018 03/18/2018 03/03/2018 02/16/2018  Decreased Interest 0 1 1 2 3   Down, Depressed, Hopeless 0 0 0 1 3  PHQ - 2 Score 0 1 1 3 6   Altered sleeping 1 1 1 1 3   Tired, decreased energy 1 1 1 3 3   Change in appetite 0 1 1 0 2  Feeling bad or failure about yourself  1 0 0 1 2  Trouble concentrating 0 0 0 1 2  Moving slowly or fidgety/restless 0 0 0 0 0  Suicidal thoughts 0 0 0 0 1  PHQ-9 Score 3 4 4 9 19   Difficult doing work/chores Not difficult at all Somewhat difficult Not difficult at all Somewhat difficult Very difficult   GAD 7 : Generalized Anxiety Score 05/20/2018 02/16/2018  Nervous, Anxious, on Edge 2 2  Control/stop worrying 1 2  Worry too much - different things 1 2  Trouble relaxing 1 1  Restless 0 0  Easily annoyed or irritable 0 1  Afraid - awful might happen 1 1  Total GAD 7 Score 6 9  Anxiety Difficulty Not difficult at all Somewhat difficult   UPPER RESPIRATORY TRACT INFECTION Duration: Couple of weeks Worst symptom: cough Fever: no Cough: yes Shortness of breath: no Wheezing: no Chest  pain: no Chest tightness: no Chest congestion: no Nasal congestion: no Runny nose: no Post nasal drip: no Sneezing: no Sore throat: no Swollen glands: no Sinus pressure: no Headache: no Face pain: no Toothache: no Ear pain: no  Ear pressure: no  Eyes red/itching:no Eye drainage/crusting: no  Vomiting: no Rash: no Fatigue: no Sick contacts: no Strep contacts: no  Context: worse Recurrent sinusitis: no Relief with OTC cold/cough medications: no  Treatments attempted: none    Relevant past medical, surgical, family and social history reviewed and updated as indicated. Interim medical history since our last visit reviewed. Allergies and medications reviewed and updated.  Review of Systems  Constitutional: Positive for fatigue. Negative for activity change, appetite change, chills, diaphoresis, fever and unexpected weight change.  Respiratory: Positive for cough and chest tightness. Negative for apnea, choking, shortness of breath, wheezing and stridor.   Cardiovascular: Negative.   Gastrointestinal: Negative.   Psychiatric/Behavioral: Negative.     Per HPI unless specifically indicated above     Objective:    BP 116/77   Pulse (!) 111   Temp 98.4 F (36.9 C) (Oral)   Wt 119 lb (54 kg)   SpO2 98%   BMI 21.28 kg/m   Wt Readings from Last 3 Encounters:  05/20/18 119 lb (54 kg)  04/15/18 121 lb 2 oz (54.9  kg)  03/18/18 126 lb 8 oz (57.4 kg)    Physical Exam  Constitutional: She is oriented to person, place, and time. She appears well-developed and well-nourished. No distress.  HENT:  Head: Normocephalic and atraumatic.  Right Ear: Hearing normal.  Left Ear: Hearing normal.  Nose: Nose normal.  Eyes: Conjunctivae and lids are normal. Right eye exhibits no discharge. Left eye exhibits no discharge. No scleral icterus.  Cardiovascular: Normal rate, regular rhythm, normal heart sounds and intact distal pulses. Exam reveals no gallop and no friction rub.  No  murmur heard. Pulmonary/Chest: Effort normal. No stridor. No respiratory distress. She has wheezes. She has no rales. She exhibits no tenderness.  Musculoskeletal: Normal range of motion.  Neurological: She is alert and oriented to person, place, and time.  Skin: Skin is warm, dry and intact. Capillary refill takes less than 2 seconds. No rash noted. She is not diaphoretic. No erythema. No pallor.  Psychiatric: She has a normal mood and affect. Her speech is normal and behavior is normal. Judgment and thought content normal. Cognition and memory are normal.    Results for orders placed or performed in visit on 04/15/18  Bayer DCA Hb A1c Waived  Result Value Ref Range   HB A1C (BAYER DCA - WAIVED) 6.7 <7.0 %  CBC with Differential/Platelet  Result Value Ref Range   WBC 7.0 3.4 - 10.8 x10E3/uL   RBC 4.82 3.77 - 5.28 x10E6/uL   Hemoglobin 15.4 11.1 - 15.9 g/dL   Hematocrit 84.645.1 96.234.0 - 46.6 %   MCV 94 79 - 97 fL   MCH 32.0 26.6 - 33.0 pg   MCHC 34.1 31.5 - 35.7 g/dL   RDW 95.213.6 84.112.3 - 32.415.4 %   Platelets 145 (L) 150 - 450 x10E3/uL   Neutrophils 62 Not Estab. %   Lymphs 27 Not Estab. %   Monocytes 9 Not Estab. %   Eos 2 Not Estab. %   Basos 0 Not Estab. %   Neutrophils Absolute 4.3 1.4 - 7.0 x10E3/uL   Lymphocytes Absolute 1.9 0.7 - 3.1 x10E3/uL   Monocytes Absolute 0.6 0.1 - 0.9 x10E3/uL   EOS (ABSOLUTE) 0.1 0.0 - 0.4 x10E3/uL   Basophils Absolute 0.0 0.0 - 0.2 x10E3/uL   Immature Granulocytes 0 Not Estab. %   Immature Grans (Abs) 0.0 0.0 - 0.1 x10E3/uL  TSH  Result Value Ref Range   TSH 2.260 0.450 - 4.500 uIU/mL  Comprehensive metabolic panel  Result Value Ref Range   Glucose 104 (H) 65 - 99 mg/dL   BUN 11 8 - 27 mg/dL   Creatinine, Ser 4.010.77 0.57 - 1.00 mg/dL   GFR calc non Af Amer 81 >59 mL/min/1.73   GFR calc Af Amer 93 >59 mL/min/1.73   BUN/Creatinine Ratio 14 12 - 28   Sodium 141 134 - 144 mmol/L   Potassium 4.0 3.5 - 5.2 mmol/L   Chloride 100 96 - 106 mmol/L   CO2 23  20 - 29 mmol/L   Calcium 9.6 8.7 - 10.3 mg/dL   Total Protein 6.6 6.0 - 8.5 g/dL   Albumin 4.1 3.6 - 4.8 g/dL   Globulin, Total 2.5 1.5 - 4.5 g/dL   Albumin/Globulin Ratio 1.6 1.2 - 2.2   Bilirubin Total 0.5 0.0 - 1.2 mg/dL   Alkaline Phosphatase 89 39 - 117 IU/L   AST 39 0 - 40 IU/L   ALT 31 0 - 32 IU/L  Microalbumin, Urine Waived  Result Value Ref Range  Microalb, Ur Waived 30 (H) 0 - 19 mg/L   Creatinine, Urine Waived 50 10 - 300 mg/dL   Microalb/Creat Ratio 30-300 (H) <30 mg/g      Assessment & Plan:   Problem List Items Addressed This Visit      Respiratory   Chronic obstructive pulmonary disease (HCC)    Better following neb. Will change to symbicort and use albuterol 2-3x a day, if not better in the next week, will treat with abx and prednisone. Call with any concerns. Ordering CT scan.       Relevant Medications   albuterol (PROVENTIL) (2.5 MG/3ML) 0.083% nebulizer solution 2.5 mg (Completed)   Other Relevant Orders   CT Chest Wo Contrast     Other   Complicated grief    Doing much better on the 15mg - continue current regimen. Continue to monitor.        Other Visit Diagnoses    COPD exacerbation (HCC)    -  Primary   Relevant Medications   albuterol (PROVENTIL) (2.5 MG/3ML) 0.083% nebulizer solution 2.5 mg (Completed)   Weight loss       Has continued to lose weight. Will get CT chest. Ordered today.       Follow up plan: Return As scheduled.

## 2018-05-26 DIAGNOSIS — Z79891 Long term (current) use of opiate analgesic: Secondary | ICD-10-CM | POA: Diagnosis not present

## 2018-05-30 ENCOUNTER — Ambulatory Visit: Payer: Self-pay | Admitting: *Deleted

## 2018-05-30 NOTE — Telephone Encounter (Signed)
Pt called with c/o cough that has been going on for a little more than 2 weeks. It productive. She was seen in the office on July 12 th and she said she was told to call back if she did not get any better and an antibiotic could be called in for her. No fever. She stated that her cough is somewhat better today because she has used her inhaler.  So she is calling to see if a prescription could be called in for her. So she is requesting a call back from her pcp today.  Per protocol, she should be seen within 3 days. [facility]. Home care advice given to patient with verbal understanding.  Advised to call back for fever, increase in coughing and over all not feeling well. Pt voiced understanding.   Reason for Disposition . Cough has been present for > 3 weeks  Answer Assessment - Initial Assessment Questions 1. ONSET: "When did the cough begin?"      About 3 weeks quit for a while and then came back 2. SEVERITY: "How bad is the cough today?"      Not too bad today 3. RESPIRATORY DISTRESS: "Describe your breathing."      no 4. FEVER: "Do you have a fever?" If so, ask: "What is your temperature, how was it measured, and when did it start?"     no 5. SPUTUM: "Describe the color of your sputum" (clear, white, yellow, green)     Greenish brown 6. HEMOPTYSIS: "Are you coughing up any blood?" If so ask: "How much?" (flecks, streaks, tablespoons, etc.)     no 7. CARDIAC HISTORY: "Do you have any history of heart disease?" (e.g., heart attack, congestive heart failure)      no 8. LUNG HISTORY: "Do you have any history of lung disease?"  (e.g., pulmonary embolus, asthma, emphysema)     COPD 9. PE RISK FACTORS: "Do you have a history of blood clots?" (or: recent major surgery, recent prolonged travel, bedridden)     no 10. OTHER SYMPTOMS: "Do you have any other symptoms?" (e.g., runny nose, wheezing, chest pain)       no 12. TRAVEL: "Have you traveled out of the  country in the last month?" (e.g., travel history, exposures)       no  Protocols used: COUGH - ACUTE PRODUCTIVE-A-AH

## 2018-05-30 NOTE — Telephone Encounter (Signed)
Please get her an appointment today if possible

## 2018-05-31 ENCOUNTER — Ambulatory Visit: Payer: Medicare Other | Admitting: Family Medicine

## 2018-06-01 ENCOUNTER — Ambulatory Visit: Payer: Medicare Other | Attending: Family Medicine

## 2018-06-02 ENCOUNTER — Telehealth: Payer: Self-pay | Admitting: Family Medicine

## 2018-06-02 NOTE — Telephone Encounter (Signed)
Copied from CRM [phone]. Topic: General - Other >> Jun 01, 2018 11:46 AM Gerrianne ScalePayne, Angela L wrote: Reason for CRM: Romeo AppleBen [facility] calling stating that pt didn't come to her 8 :00 appt today   >> Jun 01, 2018  2:44 PM Adela PortsWilliamson, Christan M wrote: Appt Lorain ChildesFYI

## 2018-06-02 NOTE — Telephone Encounter (Signed)
Can we please call patient and see about getting her rescheduled?

## 2018-06-03 NOTE — Telephone Encounter (Signed)
Patient states that her schedule at work is crazy and that she does not know when she will be off, she will contact us and let us know when she can do it.

## 2018-06-06 ENCOUNTER — Ambulatory Visit: Payer: Medicare Other | Admitting: Family Medicine

## 2018-06-16 DIAGNOSIS — Z79891 Long term (current) use of opiate analgesic: Secondary | ICD-10-CM | POA: Diagnosis not present

## 2018-06-22 ENCOUNTER — Telehealth: Payer: Self-pay

## 2018-06-22 NOTE — Telephone Encounter (Signed)
Copied from CRM [phone]. Topic: Medicare AWV >> Jun 22, 2018  3:02 PM MitchellHill, Nevadaiffany A, LPN wrote: Reason for CRM: Called patient to inform her medicare annual wellness visit will be cancelled on 09/19/2018, she will still see Dr.johnson at 11:00am on 09/19/2018 who will do her wellness visit and physical this year.    Any questions please call [phone]

## 2018-06-23 DIAGNOSIS — Z79891 Long term (current) use of opiate analgesic: Secondary | ICD-10-CM | POA: Diagnosis not present

## 2018-07-27 DIAGNOSIS — Z79891 Long term (current) use of opiate analgesic: Secondary | ICD-10-CM | POA: Diagnosis not present

## 2018-08-25 DIAGNOSIS — Z79891 Long term (current) use of opiate analgesic: Secondary | ICD-10-CM | POA: Diagnosis not present

## 2018-09-19 ENCOUNTER — Encounter: Payer: Self-pay | Admitting: Family Medicine

## 2018-09-19 ENCOUNTER — Ambulatory Visit (INDEPENDENT_AMBULATORY_CARE_PROVIDER_SITE_OTHER): Payer: Medicare Other | Admitting: Family Medicine

## 2018-09-19 ENCOUNTER — Ambulatory Visit: Payer: Medicare Other

## 2018-09-19 VITALS — BP 148/84 | HR 97 | Temp 98.7°F | Ht 62.3 in | Wt 121.1 lb

## 2018-09-19 DIAGNOSIS — F4321 Adjustment disorder with depressed mood: Secondary | ICD-10-CM

## 2018-09-19 DIAGNOSIS — E119 Type 2 diabetes mellitus without complications: Secondary | ICD-10-CM | POA: Diagnosis not present

## 2018-09-19 DIAGNOSIS — Z720 Tobacco use: Secondary | ICD-10-CM | POA: Diagnosis not present

## 2018-09-19 DIAGNOSIS — F4329 Adjustment disorder with other symptoms: Secondary | ICD-10-CM | POA: Diagnosis not present

## 2018-09-19 DIAGNOSIS — J441 Chronic obstructive pulmonary disease with (acute) exacerbation: Secondary | ICD-10-CM

## 2018-09-19 DIAGNOSIS — F4381 Prolonged grief disorder: Secondary | ICD-10-CM

## 2018-09-19 DIAGNOSIS — Z23 Encounter for immunization: Secondary | ICD-10-CM | POA: Diagnosis not present

## 2018-09-19 DIAGNOSIS — Z7189 Other specified counseling: Secondary | ICD-10-CM

## 2018-09-19 DIAGNOSIS — Z634 Disappearance and death of family member: Secondary | ICD-10-CM

## 2018-09-19 DIAGNOSIS — Z0000 Encounter for general adult medical examination without abnormal findings: Secondary | ICD-10-CM

## 2018-09-19 LAB — UA/M W/RFLX CULTURE, ROUTINE
BILIRUBIN UA: NEGATIVE
GLUCOSE, UA: NEGATIVE
KETONES UA: NEGATIVE
Nitrite, UA: NEGATIVE
Protein, UA: NEGATIVE
SPEC GRAV UA: 1.015 (ref 1.005–1.030)
UUROB: 2 mg/dL — AB (ref 0.2–1.0)
pH, UA: 7 (ref 5.0–7.5)

## 2018-09-19 LAB — BAYER DCA HB A1C WAIVED: HB A1C: 5.7 % (ref <7.0)

## 2018-09-19 LAB — MICROSCOPIC EXAMINATION: Bacteria, UA: NONE SEEN

## 2018-09-19 LAB — MICROALBUMIN, URINE WAIVED
Creatinine, Urine Waived: 50 mg/dL (ref 10–300)
Microalb, Ur Waived: 30 mg/L — ABNORMAL HIGH (ref 0–19)

## 2018-09-19 MED ORDER — ANORO ELLIPTA 62.5-25 MCG/INH IN AEPB: 1.0000 | INHALATION_SPRAY | Freq: Every day | RESPIRATORY_TRACT | 1 refills | Status: DC

## 2018-09-19 MED ORDER — ARIPIPRAZOLE 15 MG PO TBDP: 15.0000 mg | ORAL_TABLET | Freq: Every day | ORAL | 1 refills | Status: DC

## 2018-09-19 MED ORDER — ALBUTEROL SULFATE HFA 108 (90 BASE) MCG/ACT IN AERS: 2.0000 | INHALATION_SPRAY | Freq: Four times a day (QID) | RESPIRATORY_TRACT | 3 refills | Status: DC | PRN

## 2018-09-19 NOTE — Assessment & Plan Note (Signed)
Discussed quitting with patient for 4 minutes. Not interested in quitting right now. Declines low dose CT screening. She knows that we are here if she changes her mind. Call with any concerns.

## 2018-09-19 NOTE — Assessment & Plan Note (Signed)
Stable on anoro. Doing better. Continue current regimen. Continue to monitor. Call with any concerns.

## 2018-09-19 NOTE — Progress Notes (Signed)
BP (!) 148/84 (BP Location: Left Arm, Cuff Size: Normal)   Pulse 97   Temp 98.7 F (37.1 C) (Oral)   Ht 5' 2.3" (1.582 m)   Wt 121 lb 1.6 oz (54.9 kg)   SpO2 96%   BMI 21.94 kg/m    Subjective:    Patient ID: Kim Graham, female    DOB: 09-Feb-1951, 67 y.o.   MRN: [id number]  HPI: Phyillis Dascoli Graham is a 67 y.o. female presenting on 09/19/2018 for comprehensive medical examination. Current medical complaints include:  COPD COPD status: controlled Satisfied with current treatment?: yes Oxygen use: no Dyspnea frequency: rarely Cough frequency: daily Rescue inhaler frequency: a few times a week   Limitation of activity: no Productive cough: none Pneumovax: Up to Date Influenza: Up to Date  DIABETES Hypoglycemic episodes:no Polydipsia/polyuria: no Visual disturbance: no Chest pain: no Paresthesias: no Glucose Monitoring: no  Accucheck frequency: Not Checking Taking Insulin?: no Blood Pressure Monitoring: not checking Retinal Examination: Not up to Date Foot Exam: Up to Date Diabetic Education: Not Completed Pneumovax: Up to Date Influenza: Up to Date Aspirin: no  Complicated Grief Mood status: controlled Satisfied with current treatment?: yes Symptom severity: mild  Duration of current treatment : chronic Side effects: no Medication compliance: excellent compliance Psychotherapy/counseling: no  Previous psychiatric medications: abilify Depressed mood: no Anxious mood: no Anhedonia: no Significant weight loss or gain: no Insomnia: no  Fatigue: no Feelings of worthlessness or guilt: no Impaired concentration/indecisiveness: no Suicidal ideations: no Hopelessness: no Crying spells: no Depression screen [facility] 2/9 09/19/2018 05/20/2018 03/18/2018 03/18/2018 03/03/2018  Decreased Interest 0 0 1 1 2   Down, Depressed, Hopeless 0 0 0 0 1  PHQ - 2 Score 0 0 1 1 3   Altered sleeping 1 1 1 1 1   Tired, decreased energy 0 1 1 1 3   Change in appetite 0 0 1 1 0  Feeling  bad or failure about yourself  0 1 0 0 1  Trouble concentrating 0 0 0 0 1  Moving slowly or fidgety/restless 0 0 0 0 0  Suicidal thoughts 0 0 0 0 0  PHQ-9 Score 1 3 4 4 9   Difficult doing work/chores - Not difficult at all Somewhat difficult Not difficult at all Somewhat difficult   She currently lives with: Alone Menopausal Symptoms: no  Functional Status Survey: Is the patient deaf or have difficulty hearing?: No Does the patient have difficulty seeing, even when wearing glasses/contacts?: No Does the patient have difficulty concentrating, remembering, or making decisions?: No Does the patient have difficulty walking or climbing stairs?: No Does the patient have difficulty dressing or bathing?: No Does the patient have difficulty doing errands alone such as visiting a doctor's office or shopping?: No  Fall Risk  09/19/2018 03/18/2018 06/18/2017 06/16/2017  Falls in the past year? 0 No No No    Depression Screen Depression screen [facility] 2/9 09/19/2018 05/20/2018 03/18/2018 03/18/2018 03/03/2018  Decreased Interest 0 0 1 1 2   Down, Depressed, Hopeless 0 0 0 0 1  PHQ - 2 Score 0 0 1 1 3   Altered sleeping 1 1 1 1 1   Tired, decreased energy 0 1 1 1 3   Change in appetite 0 0 1 1 0  Feeling bad or failure about yourself  0 1 0 0 1  Trouble concentrating 0 0 0 0 1  Moving slowly or fidgety/restless 0 0 0 0 0  Suicidal thoughts 0 0 0 0 0  PHQ-9 Score 1 3  4 4 9   Difficult doing work/chores - Not difficult at all Somewhat difficult Not difficult at all Somewhat difficult    Advanced Directives Does patient have a HCPOA?    no Does patient have a living will or MOST form?  no  Past Medical History:  Past Medical History:  Diagnosis Date  . COPD (chronic obstructive pulmonary disease) (HCC)     Surgical History:  History reviewed. No pertinent surgical history.  Medications:  Current Outpatient Medications on File Prior to Visit  Medication Sig  . METHADONE HCL PO Take 83 mg by mouth  daily.   No current facility-administered medications on file prior to visit.     Allergies:  No Known Allergies  Social History:  Social History   Socioeconomic History  . Marital status: Single    Spouse name: Not on file  . Number of children: Not on file  . Years of education: Not on file  . Highest education level: Not on file  Occupational History  . Not on file  Social Needs  . Financial resource strain: Not on file  . Food insecurity:    Worry: Not on file    Inability: Not on file  . Transportation needs:    Medical: Not on file    Non-medical: Not on file  Tobacco Use  . Smoking status: Current Every Day Smoker    Packs/day: 0.75    Years: 49.00    Pack years: 36.75  . Smokeless tobacco: Never Used  Substance and Sexual Activity  . Alcohol use: Yes    Comment: On occasion  . Drug use: No  . Sexual activity: Not Currently  Lifestyle  . Physical activity:    Days per week: Not on file    Minutes per session: Not on file  . Stress: Not on file  Relationships  . Social connections:    Talks on phone: Not on file    Gets together: Not on file    Attends religious service: Not on file    Active member of club or organization: Not on file    Attends meetings of clubs or organizations: Not on file    Relationship status: Not on file  . Intimate partner violence:    Fear of current or ex partner: Not on file    Emotionally abused: Not on file    Physically abused: Not on file    Forced sexual activity: Not on file  Other Topics Concern  . Not on file  Social History Narrative  . Not on file   Social History   Tobacco Use  Smoking Status Current Every Day Smoker  . Packs/day: 0.75  . Years: 49.00  . Pack years: 36.75  Smokeless Tobacco Never Used   Social History   Substance and Sexual Activity  Alcohol Use Yes   Comment: On occasion    Family History:  Family History  Problem Relation Age of Onset  . Diabetes Mother   . Cancer Father          Pancreatic  . Diabetes Maternal Aunt   . Diabetes Maternal Grandmother   . Dementia Maternal Grandfather   . Heart disease Maternal Grandfather     Past medical history, surgical history, medications, allergies, family history and social history reviewed with patient today and changes made to appropriate areas of the chart.   Review of Systems  Constitutional: Negative.   HENT: Negative.   Eyes: Negative.   Respiratory: Positive for cough. Negative  for hemoptysis, sputum production, shortness of breath and wheezing.   Cardiovascular: Negative.   Gastrointestinal: Positive for constipation. Negative for abdominal pain, blood in stool, diarrhea, heartburn, melena, nausea and vomiting.  Genitourinary: Negative.   Musculoskeletal: Negative.   Skin: Negative.   Neurological: Negative.   Endo/Heme/Allergies: Negative.   Psychiatric/Behavioral: Negative.     All other ROS negative except what is listed above and in the HPI.      Objective:    BP (!) 148/84 (BP Location: Left Arm, Cuff Size: Normal)   Pulse 97   Temp 98.7 F (37.1 C) (Oral)   Ht 5' 2.3" (1.582 m)   Wt 121 lb 1.6 oz (54.9 kg)   SpO2 96%   BMI 21.94 kg/m   Wt Readings from Last 3 Encounters:  09/19/18 121 lb 1.6 oz (54.9 kg)  05/20/18 119 lb (54 kg)  04/15/18 121 lb 2 oz (54.9 kg)    Physical Exam  Constitutional: She is oriented to person, place, and time. She appears well-developed and well-nourished. No distress.  HENT:  Head: Normocephalic and atraumatic.  Right Ear: Hearing, tympanic membrane, external ear and ear canal normal.  Left Ear: Hearing, tympanic membrane, external ear and ear canal normal.  Nose: Nose normal.  Mouth/Throat: Uvula is midline, oropharynx is clear and moist and mucous membranes are normal. Dental caries present. No oropharyngeal exudate.  Eyes: Pupils are equal, round, and reactive to light. Conjunctivae, EOM and lids are normal. Right eye exhibits no discharge. Left eye  exhibits no discharge. No scleral icterus.  Neck: Normal range of motion. Neck supple. No JVD present. No tracheal deviation present. No thyromegaly present.  Cardiovascular: Normal rate, regular rhythm, normal heart sounds and intact distal pulses. Exam reveals no gallop and no friction rub.  No murmur heard. Pulmonary/Chest: Effort normal and breath sounds normal. No stridor. No respiratory distress. She has no wheezes. She has no rales. She exhibits no tenderness.  Abdominal: Soft. Bowel sounds are normal. She exhibits no distension and no mass. There is no tenderness. There is no rebound and no guarding. No hernia.  Genitourinary:  Genitourinary Comments: Breast and pelvic exams deferred with shared decision making  Musculoskeletal: Normal range of motion. She exhibits no edema, tenderness or deformity.  Lymphadenopathy:    She has no cervical adenopathy.  Neurological: She is alert and oriented to person, place, and time. She displays normal reflexes. No cranial nerve deficit or sensory deficit. She exhibits normal muscle tone. Coordination normal.  Skin: Skin is warm, dry and intact. Capillary refill takes less than 2 seconds. No rash noted. She is not diaphoretic. No erythema. No pallor.  Psychiatric: She has a normal mood and affect. Her speech is normal and behavior is normal. Judgment and thought content normal. Cognition and memory are normal.  Nursing note and vitals reviewed.   6CIT Screen 09/19/2018 06/18/2017  What Year? 0 points 0 points  What month? 0 points 0 points  What time? 0 points 0 points  Count back from 20 0 points 0 points  Months in reverse 0 points 0 points  Repeat phrase 2 points 0 points  Total Score 2 0     Results for orders placed or performed in visit on 04/15/18  Bayer DCA Hb A1c Waived  Result Value Ref Range   HB A1C (BAYER DCA - WAIVED) 6.7 <7.0 %  CBC with Differential/Platelet  Result Value Ref Range   WBC 7.0 3.4 - 10.8 x10E3/uL   RBC 4.82  3.77 - 5.28 x10E6/uL   Hemoglobin 15.4 11.1 - 15.9 g/dL   Hematocrit 16.1 09.6 - 46.6 %   MCV 94 79 - 97 fL   MCH 32.0 26.6 - 33.0 pg   MCHC 34.1 31.5 - 35.7 g/dL   RDW 04.5 40.9 - 81.1 %   Platelets 145 (L) 150 - 450 x10E3/uL   Neutrophils 62 Not Estab. %   Lymphs 27 Not Estab. %   Monocytes 9 Not Estab. %   Eos 2 Not Estab. %   Basos 0 Not Estab. %   Neutrophils Absolute 4.3 1.4 - 7.0 x10E3/uL   Lymphocytes Absolute 1.9 0.7 - 3.1 x10E3/uL   Monocytes Absolute 0.6 0.1 - 0.9 x10E3/uL   EOS (ABSOLUTE) 0.1 0.0 - 0.4 x10E3/uL   Basophils Absolute 0.0 0.0 - 0.2 x10E3/uL   Immature Granulocytes 0 Not Estab. %   Immature Grans (Abs) 0.0 0.0 - 0.1 x10E3/uL  TSH  Result Value Ref Range   TSH 2.260 0.450 - 4.500 uIU/mL  Comprehensive metabolic panel  Result Value Ref Range   Glucose 104 (H) 65 - 99 mg/dL   BUN 11 8 - 27 mg/dL   Creatinine, Ser 9.14 0.57 - 1.00 mg/dL   GFR calc non Af Amer 81 >59 mL/min/1.73   GFR calc Af Amer 93 >59 mL/min/1.73   BUN/Creatinine Ratio 14 12 - 28   Sodium 141 134 - 144 mmol/L   Potassium 4.0 3.5 - 5.2 mmol/L   Chloride 100 96 - 106 mmol/L   CO2 23 20 - 29 mmol/L   Calcium 9.6 8.7 - 10.3 mg/dL   Total Protein 6.6 6.0 - 8.5 g/dL   Albumin 4.1 3.6 - 4.8 g/dL   Globulin, Total 2.5 1.5 - 4.5 g/dL   Albumin/Globulin Ratio 1.6 1.2 - 2.2   Bilirubin Total 0.5 0.0 - 1.2 mg/dL   Alkaline Phosphatase 89 39 - 117 IU/L   AST 39 0 - 40 IU/L   ALT 31 0 - 32 IU/L  Microalbumin, Urine Waived  Result Value Ref Range   Microalb, Ur Waived 30 (H) 0 - 19 mg/L   Creatinine, Urine Waived 50 10 - 300 mg/dL   Microalb/Creat Ratio 30-300 (H) <30 mg/g      Assessment & Plan:   Problem List Items Addressed This Visit      Respiratory   Chronic obstructive pulmonary disease (HCC)    Stable on anoro. Doing better. Continue current regimen. Continue to monitor. Call with any concerns.       Relevant Medications   ANORO ELLIPTA 62.5-25 MCG/INH AEPB   albuterol  (PROVENTIL HFA;VENTOLIN HFA) 108 (90 Base) MCG/ACT inhaler   Other Relevant Orders   CBC with Differential/Platelet   Comprehensive metabolic panel   TSH   UA/M w/rflx Culture, Routine     Endocrine   Diabetes mellitus type 2, diet-controlled (HCC)    Doing much better with A1c of 5.7- continue diet and exercise. Continue to monitor. Recheck 6 months.       Relevant Orders   Bayer DCA Hb A1c Waived   Comprehensive metabolic panel   Lipid Panel w/o Chol/HDL Ratio   Microalbumin, Urine Waived   TSH   UA/M w/rflx Culture, Routine     Other   Tobacco abuse    Discussed quitting with patient for 4 minutes. Not interested in quitting right now. Declines low dose CT screening. She knows that we are here if she changes her mind. Call with any concerns.  Relevant Orders   CBC with Differential/Platelet   Comprehensive metabolic panel   TSH   UA/M w/rflx Culture, Routine   Advance directive discussed with patient    A voluntary discussion about advance care planning including the explanation and discussion of advance directives was extensively discussed  with the patient.  Explanation about the health care proxy and Living will was reviewed and packet with forms with explanation of how to fill them out was given.  During this discussion, the patient was not able to identify a health care proxy but plans to fill out the paperwork required.  Patient was offered a separate Advance Care Planning visit for further assistance with forms.         Complicated grief    Doing well on her abilify. Feeling more like herself. Continue current regimen. Continue to monitor. Call with any concerns. Refills given.       Other Visit Diagnoses    Encounter for Medicare annual wellness exam    -  Primary   Preventative care discussed today as below.    Routine general medical examination at a health care facility       Vaccines up dated/declined, screening labs checked today, Mammogram and DEXA  ordered. Cologuard at home. Declines lung cancer screening. Continue to monitor.    Need for influenza vaccination       Flu shot given today.   Relevant Orders   Flu vaccine HIGH DOSE PF (Completed)   Need for pneumococcal vaccination       Pneumonia shot given today.   Relevant Orders   Pneumococcal polysaccharide vaccine 23-valent greater than or equal to 2yo subcutaneous/IM (Completed)       Preventative Services:  Health Risk Assessment and Personalized Prevention Plan: Done today  Bone Mass Measurements: Ordered today Breast Cancer Screening: Ordered today CVD Screening:  Done Today Cervical Cancer Screening: N/A Colon Cancer Screening: Cologuard at home Depression Screening: Done today Diabetes Screening: Done today Glaucoma Screening: See your eye doctor Hepatitis B vaccine: N/A Hepatitis C screening: up to date HIV Screening: up to date Flu Vaccine: Done today Lung cancer Screening: Declined Obesity Screening: Done today Pneumonia Vaccines (2): Up to date STI Screening: N/A  Follow up plan: Return in about 6 months (around 03/20/2019) for follow up.   LABORATORY TESTING:  - Pap smear: not applicable  IMMUNIZATIONS:   - Tdap: Tetanus vaccination status reviewed: Refused. - Influenza: Up to date - Pneumovax: Up to date - Prevnar: Up to date  SCREENING: -Mammogram: Ordered today  - Colonoscopy: Cologuard at home  - Bone Density: Ordered today   PATIENT COUNSELING:   Advised to take 1 mg of folate supplement per day if capable of pregnancy.   Sexuality: Discussed sexually transmitted diseases, partner selection, use of condoms, avoidance of unintended pregnancy  and contraceptive alternatives.   Advised to avoid cigarette smoking.  I discussed with the patient that most people either abstain from alcohol or drink within safe limits (<=14/week and <=4 drinks/occasion for males, <=7/weeks and <= 3 drinks/occasion for females) and that the risk for alcohol  disorders and other health effects rises proportionally with the number of drinks per week and how often a drinker exceeds daily limits.  Discussed cessation/primary prevention of drug use and availability of treatment for abuse.   Diet: Encouraged to adjust caloric intake to maintain  or achieve ideal body weight, to reduce intake of dietary saturated fat and total fat, to limit sodium intake by avoiding high  sodium foods and not adding table salt, and to maintain adequate dietary potassium and calcium preferably from fresh fruits, vegetables, and low-fat dairy products.    stressed the importance of regular exercise  Injury prevention: Discussed safety belts, safety helmets, smoke detector, smoking near bedding or upholstery.   Dental health: Discussed importance of regular tooth brushing, flossing, and dental visits.    NEXT PREVENTATIVE PHYSICAL DUE IN 1 YEAR. Return in about 6 months (around 03/20/2019) for follow up.

## 2018-09-19 NOTE — Assessment & Plan Note (Signed)
A voluntary discussion about advance care planning including the explanation and discussion of advance directives was extensively discussed  with the patient.  Explanation about the health care proxy and Living will was reviewed and packet with forms with explanation of how to fill them out was given.  During this discussion, the patient was not able to identify a health care proxy but plans to fill out the paperwork required.  Patient was offered a separate Advance Care Planning visit for further assistance with forms.    

## 2018-09-19 NOTE — Assessment & Plan Note (Signed)
Doing well on her abilify. Feeling more like herself. Continue current regimen. Continue to monitor. Call with any concerns. Refills given.

## 2018-09-19 NOTE — Assessment & Plan Note (Signed)
Doing much better with A1c of 5.7- continue diet and exercise. Continue to monitor. Recheck 6 months.

## 2018-09-19 NOTE — Patient Instructions (Addendum)
[facility]  Address: [address] [place], [place], [place] [address]  Phone: [phone]  Preventative Services:  Health Risk Assessment and Personalized Prevention Plan: Done today  Bone Mass Measurements: Ordered today Breast Cancer Screening: Ordered today CVD Screening:  Done Today Cervical Cancer Screening: N/A Colon Cancer Screening: Cologuard at home Depression Screening: Done today Diabetes Screening: Done today Glaucoma Screening: See your eye doctor Hepatitis B vaccine: N/A Hepatitis C screening: up to date HIV Screening: up to date Flu Vaccine: Done today Lung cancer Screening: Declined Obesity Screening: Done today Pneumonia Vaccines (2): Up to date STI Screening: N/A Influenza (Flu) Vaccine (Inactivated or Recombinant): What You Need to Know 1. Why get vaccinated? Influenza ("flu") is a contagious disease that spreads around the [place] every year, usually between October and May. Flu is caused by influenza viruses, and is spread mainly by coughing, sneezing, and close contact. Anyone can get flu. Flu strikes suddenly and can last several days. Symptoms vary by age, but can include:  fever/chills  sore throat  muscle aches  fatigue  cough  headache  runny or stuffy nose  Flu can also lead to pneumonia and blood infections, and cause diarrhea and seizures in children. If you have a medical condition, such as heart or lung disease, flu can make it worse. Flu is more dangerous for some people. Infants and young children, people 42 years of age and older, pregnant women, and people with certain health conditions or a weakened immune system are at greatest risk. Each year thousands of people in the [place] die from flu, and many more are hospitalized. Flu vaccine can:  keep you from getting flu,  make flu less severe if you do get it, and  keep you from spreading flu to your family and other people. 2.  Inactivated and recombinant flu vaccines A dose of flu vaccine is recommended every flu season. Children 6 months through 10 years of age may need two doses during the same flu season. Everyone else needs only one dose each flu season. Some inactivated flu vaccines contain a very small amount of a mercury-based preservative called thimerosal. Studies have not shown thimerosal in vaccines to be harmful, but flu vaccines that do not contain thimerosal are available. There is no live flu virus in flu shots. They cannot cause the flu. There are many flu viruses, and they are always changing. Each year a new flu vaccine is made to protect against three or four viruses that are likely to cause disease in the upcoming flu season. But even when the vaccine doesn't exactly match these viruses, it may still provide some protection. Flu vaccine cannot prevent:  flu that is caused by a virus not covered by the vaccine, or  illnesses that look like flu but are not.  It takes about 2 weeks for protection to develop after vaccination, and protection lasts through the flu season. 3. Some people should not get this vaccine Tell the person who is giving you the vaccine:  If you have any severe, life-threatening allergies. If you ever had a life-threatening allergic reaction after a dose of flu vaccine, or have a severe allergy to any part of this vaccine, you may be advised not to get vaccinated. Most, but not all, types of flu vaccine contain a small amount of egg protein.  If you ever had Guillain-Barr Syndrome (also called GBS). Some people with a history of GBS should not get this vaccine.  This should be discussed with your doctor.  If you are not feeling well. It is usually okay to get flu vaccine when you have a mild illness, but you might be asked to come back when you feel better.  4. Risks of a vaccine reaction With any medicine, including vaccines, there is a chance of reactions. These are usually mild  and go away on their own, but serious reactions are also possible. Most people who get a flu shot do not have any problems with it. Minor problems following a flu shot include:  soreness, redness, or swelling where the shot was given  hoarseness  sore, red or itchy eyes  cough  fever  aches  headache  itching  fatigue  If these problems occur, they usually begin soon after the shot and last 1 or 2 days. More serious problems following a flu shot can include the following:  There may be a small increased risk of Guillain-Barre Syndrome (GBS) after inactivated flu vaccine. This risk has been estimated at 1 or 2 additional cases per million people vaccinated. This is much lower than the risk of severe complications from flu, which can be prevented by flu vaccine.  Young children who get the flu shot along with pneumococcal vaccine (PCV13) and/or DTaP vaccine at the same time might be slightly more likely to have a seizure caused by fever. Ask your doctor for more information. Tell your doctor if a child who is getting flu vaccine has ever had a seizure.  Problems that could happen after any injected vaccine:  People sometimes faint after a medical procedure, including vaccination. Sitting or lying down for about 15 minutes can help prevent fainting, and injuries caused by a fall. Tell your doctor if you feel dizzy, or have vision changes or ringing in the ears.  Some people get severe pain in the shoulder and have difficulty moving the arm where a shot was given. This happens very rarely.  Any medication can cause a severe allergic reaction. Such reactions from a vaccine are very rare, estimated at about 1 in a million doses, and would happen within a few minutes to a few hours after the vaccination. As with any medicine, there is a very remote chance of a vaccine causing a serious injury or death. The safety of vaccines is always being monitored. For more information, visit:  http://floyd.org/ 5. What if there is a serious reaction? What should I look for? Look for anything that concerns you, such as signs of a severe allergic reaction, very high fever, or unusual behavior. Signs of a severe allergic reaction can include hives, swelling of the face and throat, difficulty breathing, a fast heartbeat, dizziness, and weakness. These would start a few minutes to a few hours after the vaccination. What should I do?  If you think it is a severe allergic reaction or other emergency that can't wait, call 9-1-1 and get the person to the nearest hospital. Otherwise, call your doctor.  Reactions should be reported to the Vaccine Adverse Event Reporting System (VAERS). Your doctor should file this report, or you can do it yourself through the VAERS web site at www.vaers.LAgents.no, or by calling 1-6192524023. ? VAERS does not give medical advice. 6. The National Vaccine Injury Compensation Program The [employer] Vaccine Injury Compensation Program (VICP) is a federal program that was created to compensate people who may have been injured by certain vaccines. Persons who believe they may have been injured by a vaccine can learn about  the program and about filing a claim by calling 1-(606)887-9057 or visiting the VICP website at SpiritualWord.at. There is a time limit to file a claim for compensation. 7. How can I learn more?  Ask your healthcare provider. He or she can give you the vaccine package insert or suggest other sources of information.  Call your local or state health department.  Contact the Centers for Disease Control and Prevention (CDC): ? Call 985 202 6222 (1-800-CDC-INFO) or ? Visit CDC's website at BiotechRoom.com.cy Vaccine Information Statement, Inactivated Influenza Vaccine (06/15/2014) This information is not intended to replace advice given to you by your health care provider. Make sure you discuss any questions you have with your  health care provider. Document Released: 08/20/2006 Document Revised: 07/16/2016 Document Reviewed: 07/16/2016 Elsevier Interactive Patient Education  2017 [employer] Inc. Pneumococcal Polysaccharide Vaccine: What You Need to Know 1. Why get vaccinated? Vaccination can protect older adults (and some children and younger adults) from pneumococcal disease. Pneumococcal disease is caused by bacteria that can spread from person to person through close contact. It can cause ear infections, and it can also lead to more serious infections of the:  Lungs (pneumonia),  Blood (bacteremia), and  Covering of the brain and spinal cord (meningitis). Meningitis can cause deafness and brain damage, and it can be fatal.  Anyone can get pneumococcal disease, but children under 72 years of age, people with certain medical conditions, adults over 36 years of age, and cigarette smokers are at the highest risk. About 18,000 older adults die each year from pneumococcal disease in the [place]. Treatment of pneumococcal infections with penicillin and other drugs used to be more effective. But some strains of the disease have become resistant to these drugs. This makes prevention of the disease, through vaccination, even more important. 2. Pneumococcal polysaccharide vaccine (PPSV23) Pneumococcal polysaccharide vaccine (PPSV23) protects against 23 types of pneumococcal bacteria. It will not prevent all pneumococcal disease. PPSV23 is recommended for:  All adults 34 years of age and older,  Anyone 2 through 66 years of age with certain long-term health problems,  Anyone 2 through 66 years of age with a weakened immune system,  Adults 51 through 66 years of age who smoke cigarettes or have asthma.  Most people need only one dose of PPSV. A second dose is recommended for certain high-risk groups. People 61 and older should get a dose even if they have gotten one or more doses of the vaccine before they turned  65. Your healthcare provider can give you more information about these recommendations. Most healthy adults develop protection within 2 to 3 weeks of getting the shot. 3. Some people should not get this vaccine  Anyone who has had a life-threatening allergic reaction to PPSV should not get another dose.  Anyone who has a severe allergy to any component of PPSV should not receive it. Tell your provider if you have any severe allergies.  Anyone who is moderately or severely ill when the shot is scheduled may be asked to wait until they recover before getting the vaccine. Someone with a mild illness can usually be vaccinated.  Children less than 58 years of age should not receive this vaccine.  There is no evidence that PPSV is harmful to either a pregnant woman or to her fetus. However, as a precaution, women who need the vaccine should be vaccinated before becoming pregnant, if possible. 4. Risks of a vaccine reaction With any medicine, including vaccines, there is a chance of side effects.  These are usually mild and go away on their own, but serious reactions are also possible. About half of people who get PPSV have mild side effects, such as redness or pain where the shot is given, which go away within about two days. Less than 1 out of 100 people develop a fever, muscle aches, or more severe local reactions. Problems that could happen after any vaccine:  People sometimes faint after a medical procedure, including vaccination. Sitting or lying down for about 15 minutes can help prevent fainting, and injuries caused by a fall. Tell your doctor if you feel dizzy, or have vision changes or ringing in the ears.  Some people get severe pain in the shoulder and have difficulty moving the arm where a shot was given. This happens very rarely.  Any medication can cause a severe allergic reaction. Such reactions from a vaccine are very rare, estimated at about 1 in a million doses, and would happen  within a few minutes to a few hours after the vaccination. As with any medicine, there is a very remote chance of a vaccine causing a serious injury or death. The safety of vaccines is always being monitored. For more information, visit: http://floyd.org/ 5. What if there is a serious reaction? What should I look for? Look for anything that concerns you, such as signs of a severe allergic reaction, very high fever, or unusual behavior. Signs of a severe allergic reaction can include hives, swelling of the face and throat, difficulty breathing, a fast heartbeat, dizziness, and weakness. These would usually start a few minutes to a few hours after the vaccination. What should I do? If you think it is a severe allergic reaction or other emergency that can't wait, call 9-1-1 or get to the nearest hospital. Otherwise, call your doctor. Afterward, the reaction should be reported to the Vaccine Adverse Event Reporting System (VAERS). Your doctor might file this report, or you can do it yourself through the VAERS web site at www.vaers.LAgents.no, or by calling 1-647-035-8726. VAERS does not give medical advice. 6. How can I learn more?  Ask your doctor. He or she can give you the vaccine package insert or suggest other sources of information.  Call your local or state health department.  Contact the Centers for Disease Control and Prevention (CDC): ? Call 704 021 7285 (1-800-CDC-INFO) or ? Visit CDC's website at PicCapture.uy CDC Pneumococcal Polysaccharide Vaccine VIS (03/02/14) This information is not intended to replace advice given to you by your health care provider. Make sure you discuss any questions you have with your health care provider. Document Released: 08/23/2006 Document Revised: 07/16/2016 Document Reviewed: 07/16/2016 Elsevier Interactive Patient Education  2017 [employer].

## 2018-09-20 ENCOUNTER — Encounter: Payer: Self-pay | Admitting: Family Medicine

## 2018-09-20 LAB — COMPREHENSIVE METABOLIC PANEL
A/G RATIO: 1.7 (ref 1.2–2.2)
ALBUMIN: 4.1 g/dL (ref 3.6–4.8)
ALT: 59 IU/L — ABNORMAL HIGH (ref 0–32)
AST: 59 IU/L — AB (ref 0–40)
Alkaline Phosphatase: 110 IU/L (ref 39–117)
BUN / CREAT RATIO: 14 (ref 12–28)
BUN: 11 mg/dL (ref 8–27)
Bilirubin Total: 0.4 mg/dL (ref 0.0–1.2)
CALCIUM: 8.9 mg/dL (ref 8.7–10.3)
CO2: 24 mmol/L (ref 20–29)
Chloride: 100 mmol/L (ref 96–106)
Creatinine, Ser: 0.79 mg/dL (ref 0.57–1.00)
GFR calc Af Amer: 90 mL/min/{1.73_m2} (ref >59)
GFR, EST NON AFRICAN AMERICAN: 78 mL/min/{1.73_m2} (ref >59)
GLOBULIN, TOTAL: 2.4 g/dL (ref 1.5–4.5)
Glucose: 87 mg/dL (ref 65–99)
POTASSIUM: 3.9 mmol/L (ref 3.5–5.2)
SODIUM: 142 mmol/L (ref 134–144)
TOTAL PROTEIN: 6.5 g/dL (ref 6.0–8.5)

## 2018-09-20 LAB — CBC WITH DIFFERENTIAL/PLATELET
BASOS: 1 %
Basophils Absolute: 0 10*3/uL (ref 0.0–0.2)
EOS (ABSOLUTE): 0.2 10*3/uL (ref 0.0–0.4)
EOS: 3 %
HEMATOCRIT: 43.5 % (ref 34.0–46.6)
HEMOGLOBIN: 14.6 g/dL (ref 11.1–15.9)
IMMATURE GRANULOCYTES: 0 %
Immature Grans (Abs): 0 10*3/uL (ref 0.0–0.1)
Lymphocytes Absolute: 2.3 10*3/uL (ref 0.7–3.1)
Lymphs: 30 %
MCH: 30.4 pg (ref 26.6–33.0)
MCHC: 33.6 g/dL (ref 31.5–35.7)
MCV: 90 fL (ref 79–97)
MONOCYTES: 10 %
MONOS ABS: 0.7 10*3/uL (ref 0.1–0.9)
NEUTROS PCT: 56 %
Neutrophils Absolute: 4.2 10*3/uL (ref 1.4–7.0)
Platelets: 166 10*3/uL (ref 150–450)
RBC: 4.81 x10E6/uL (ref 3.77–5.28)
RDW: 13.4 % (ref 12.3–15.4)
WBC: 7.5 10*3/uL (ref 3.4–10.8)

## 2018-09-20 LAB — LIPID PANEL W/O CHOL/HDL RATIO
Cholesterol, Total: 133 mg/dL (ref 100–199)
HDL: 69 mg/dL (ref >39)
LDL CALC: 49 mg/dL (ref 0–99)
TRIGLYCERIDES: 75 mg/dL (ref 0–149)
VLDL Cholesterol Cal: 15 mg/dL (ref 5–40)

## 2018-09-20 LAB — TSH: TSH: 2.26 u[IU]/mL (ref 0.450–4.500)

## 2018-12-23 ENCOUNTER — Encounter: Payer: Self-pay | Admitting: Family Medicine

## 2018-12-23 ENCOUNTER — Ambulatory Visit (INDEPENDENT_AMBULATORY_CARE_PROVIDER_SITE_OTHER): Payer: Medicare Other | Admitting: Family Medicine

## 2018-12-23 VITALS — BP 162/82 | HR 101 | Temp 97.5°F | Wt 119.4 lb

## 2018-12-23 DIAGNOSIS — J111 Influenza due to unidentified influenza virus with other respiratory manifestations: Secondary | ICD-10-CM

## 2018-12-23 DIAGNOSIS — R52 Pain, unspecified: Secondary | ICD-10-CM

## 2018-12-23 DIAGNOSIS — R062 Wheezing: Secondary | ICD-10-CM | POA: Diagnosis not present

## 2018-12-23 LAB — VERITOR FLU A/B WAIVED
INFLUENZA A: NEGATIVE
Influenza B: NEGATIVE

## 2018-12-23 MED ORDER — PROMETHAZINE-DM 6.25-15 MG/5ML PO SYRP: 2.5000 mL | ORAL_SOLUTION | Freq: Four times a day (QID) | ORAL | 0 refills | Status: DC | PRN

## 2018-12-23 MED ORDER — ALBUTEROL SULFATE (2.5 MG/3ML) 0.083% IN NEBU
2.5000 mg | INHALATION_SOLUTION | Freq: Once | RESPIRATORY_TRACT | Status: AC
  Administered 2018-12-23: 2.5 mg via RESPIRATORY_TRACT

## 2018-12-23 MED ORDER — OSELTAMIVIR PHOSPHATE 75 MG PO CAPS: 75.0000 mg | ORAL_CAPSULE | Freq: Two times a day (BID) | ORAL | 0 refills | Status: DC

## 2018-12-23 NOTE — Progress Notes (Signed)
BP (!) 162/82   Pulse (!) 101   Temp (!) 97.5 F (36.4 C) (Oral)   Wt 119 lb 6.4 oz (54.2 kg)   SpO2 (!) 85%   BMI 21.63 kg/m    Subjective:    Patient ID: Kim Graham, female    DOB: 1951/03/21, 68 y.o.   MRN: [id number]  HPI: Kim Graham is a 68 y.o. female  Chief Complaint  Patient presents with  . URI    pt states she has had a cough, congestion, fever, body aches, and chill for the past 3 days   3 days of cough, congestion, low grade fever, body aches. Not taking anything for sxs so far other than advil. Denies Cp, SOB, N/V/D. Hx of COPD currently on anoro and albuterol. Has had some increased wheezes since onset. Has been around sick contacts the past few days.   Relevant past medical, surgical, family and social history reviewed and updated as indicated. Interim medical history since our last visit reviewed. Allergies and medications reviewed and updated.  Review of Systems  Per HPI unless specifically indicated above     Objective:    BP (!) 162/82   Pulse (!) 101   Temp (!) 97.5 F (36.4 C) (Oral)   Wt 119 lb 6.4 oz (54.2 kg)   SpO2 (!) 85%   BMI 21.63 kg/m   Wt Readings from Last 3 Encounters:  12/23/18 119 lb 6.4 oz (54.2 kg)  09/19/18 121 lb 1.6 oz (54.9 kg)  05/20/18 119 lb (54 kg)    Physical Exam Vitals signs and nursing note reviewed.  Constitutional:      Appearance: Normal appearance. She is ill-appearing.  HENT:     Head: Atraumatic.     Right Ear: Tympanic membrane and external ear normal.     Left Ear: Tympanic membrane and external ear normal.     Nose: Congestion present.     Mouth/Throat:     Mouth: Mucous membranes are moist.     Pharynx: Posterior oropharyngeal erythema present.  Eyes:     Extraocular Movements: Extraocular movements intact.     Conjunctiva/sclera: Conjunctivae normal.  Neck:     Musculoskeletal: Normal range of motion and neck supple.  Cardiovascular:     Rate and Rhythm: Normal rate and regular rhythm.       Heart sounds: Normal heart sounds.  Pulmonary:     Effort: Pulmonary effort is normal.     Breath sounds: Wheezing present. No rales.  Musculoskeletal: Normal range of motion.  Skin:    General: Skin is warm and dry.  Neurological:     Mental Status: She is alert and oriented to person, place, and time.  Psychiatric:        Mood and Affect: Mood normal.        Thought Content: Thought content normal.     Results for orders placed or performed in visit on 12/23/18  Veritor Flu A/B Waived  Result Value Ref Range   Influenza A Negative Negative   Influenza B Negative Negative      Assessment & Plan:   Problem List Items Addressed This Visit    None    Visit Diagnoses    Influenza    -  Primary   Rapid flu neg, but sxs consistent. Tx with tamiflu, supportive care, and phenergan DM.    Relevant Medications   oseltamivir (TAMIFLU) 75 MG capsule   Other Relevant Orders   Veritor Flu A/B  Waived (Completed)   Wheezing       Some improvement with neb tx in clinic. Continue home inhaler regimen, f/u if worsening or not improving   Relevant Medications   albuterol (PROVENTIL) (2.5 MG/3ML) 0.083% nebulizer solution 2.5 mg (Completed)       Follow up plan: Return if symptoms worsen or fail to improve.

## 2019-01-03 ENCOUNTER — Ambulatory Visit (INDEPENDENT_AMBULATORY_CARE_PROVIDER_SITE_OTHER): Payer: Medicare Other | Admitting: Family Medicine

## 2019-01-03 ENCOUNTER — Encounter: Payer: Self-pay | Admitting: Family Medicine

## 2019-01-03 VITALS — BP 134/79 | HR 95 | Temp 97.9°F | Ht 64.0 in | Wt 116.2 lb

## 2019-01-03 DIAGNOSIS — J441 Chronic obstructive pulmonary disease with (acute) exacerbation: Secondary | ICD-10-CM | POA: Diagnosis not present

## 2019-01-03 DIAGNOSIS — R0602 Shortness of breath: Secondary | ICD-10-CM | POA: Diagnosis not present

## 2019-01-03 MED ORDER — PREDNISONE 10 MG PO TABS: ORAL_TABLET | ORAL | 0 refills | Status: DC

## 2019-01-03 MED ORDER — AZITHROMYCIN 250 MG PO TABS: ORAL_TABLET | ORAL | 0 refills | Status: DC

## 2019-01-03 NOTE — Progress Notes (Signed)
BP 134/79 (BP Location: Left Arm, Patient Position: Sitting, Cuff Size: Normal)   Pulse 95   Temp 97.9 F (36.6 C) (Oral)   Ht 5\' 4"  (1.626 m)   Wt 116 lb 3.2 oz (52.7 kg)   SpO2 90%   BMI 19.95 kg/m    Subjective:    Patient ID: Kim Graham, female    DOB: 11-Aug-1951, 68 y.o.   MRN: [id number]  HPI: Kim Graham is a 68 y.o. female  Chief Complaint  Patient presents with  . Cough    Still having cough after 12/23/2018.  Marland Kitchen Fatigue  . Generalized Body Aches   Here today for weakness, productive cough, SOB, generalized body aches, fatigue that have persisted for 10 days. Started as a flu-like illness. Completed tamiflu and taking phenergan DM with no relief. Denies fevers, N/V/D, CP. Hx of COPD currently on anoro, albuterol prn.   Relevant past medical, surgical, family and social history reviewed and updated as indicated. Interim medical history since our last visit reviewed. Allergies and medications reviewed and updated.  Review of Systems  Per HPI unless specifically indicated above     Objective:    BP 134/79 (BP Location: Left Arm, Patient Position: Sitting, Cuff Size: Normal)   Pulse 95   Temp 97.9 F (36.6 C) (Oral)   Ht 5\' 4"  (1.626 m)   Wt 116 lb 3.2 oz (52.7 kg)   SpO2 90%   BMI 19.95 kg/m   Wt Readings from Last 3 Encounters:  01/03/19 116 lb 3.2 oz (52.7 kg)  12/23/18 119 lb 6.4 oz (54.2 kg)  09/19/18 121 lb 1.6 oz (54.9 kg)    Physical Exam Vitals signs and nursing note reviewed.  Constitutional:      Appearance: Normal appearance.  HENT:     Head: Atraumatic.     Right Ear: Tympanic membrane and external ear normal.     Left Ear: Tympanic membrane and external ear normal.     Nose: Congestion present.     Mouth/Throat:     Mouth: Mucous membranes are moist.     Pharynx: Posterior oropharyngeal erythema present.  Eyes:     Extraocular Movements: Extraocular movements intact.     Conjunctiva/sclera: Conjunctivae normal.  Neck:   Musculoskeletal: Normal range of motion and neck supple.  Cardiovascular:     Rate and Rhythm: Normal rate and regular rhythm.     Heart sounds: Normal heart sounds.  Pulmonary:     Effort: Pulmonary effort is normal.     Breath sounds: Wheezing present.  Musculoskeletal: Normal range of motion.  Skin:    General: Skin is warm and dry.  Neurological:     Mental Status: She is alert and oriented to person, place, and time.  Psychiatric:        Mood and Affect: Mood normal.        Thought Content: Thought content normal.     Results for orders placed or performed in visit on 12/23/18  Veritor Flu A/B Waived  Result Value Ref Range   Influenza A Negative Negative   Influenza B Negative Negative      Assessment & Plan:   Problem List Items Addressed This Visit      Respiratory   Chronic obstructive pulmonary disease (HCC) - Primary    Tx with zpak, prednisone, and continued inhaler regimen. Await CXR results to r/o pneumonia. F/u if not improving      Relevant Medications   azithromycin (ZITHROMAX) 250  MG tablet   predniSONE (DELTASONE) 10 MG tablet    Other Visit Diagnoses    SOB (shortness of breath)       Relevant Orders   DG Chest 2 View       Follow up plan: Return in about 1 week (around 01/10/2019) for lung recheck.

## 2019-01-05 NOTE — Assessment & Plan Note (Signed)
Tx with zpak, prednisone, and continued inhaler regimen. Await CXR results to r/o pneumonia. F/u if not improving

## 2019-01-10 ENCOUNTER — Encounter: Payer: Self-pay | Admitting: Family Medicine

## 2019-01-10 ENCOUNTER — Ambulatory Visit
Admission: RE | Admit: 2019-01-10 | Discharge: 2019-01-10 | Disposition: A | Discharge status: Home / Self Care | Payer: Medicare Other | Source: Ambulatory Visit | Attending: Family Medicine | Admitting: Family Medicine

## 2019-01-10 ENCOUNTER — Ambulatory Visit (INDEPENDENT_AMBULATORY_CARE_PROVIDER_SITE_OTHER): Payer: Medicare Other | Admitting: Family Medicine

## 2019-01-10 VITALS — BP 131/74 | HR 106 | Temp 97.5°F | Ht 64.0 in | Wt 112.5 lb

## 2019-01-10 DIAGNOSIS — R05 Cough: Secondary | ICD-10-CM | POA: Diagnosis not present

## 2019-01-10 DIAGNOSIS — J441 Chronic obstructive pulmonary disease with (acute) exacerbation: Secondary | ICD-10-CM

## 2019-01-10 DIAGNOSIS — R0602 Shortness of breath: Secondary | ICD-10-CM | POA: Diagnosis not present

## 2019-01-10 MED ORDER — PREDNISONE 20 MG PO TABS: 40.0000 mg | ORAL_TABLET | Freq: Every day | ORAL | 0 refills | Status: DC

## 2019-01-10 MED ORDER — LEVOFLOXACIN 500 MG PO TABS: 500.0000 mg | ORAL_TABLET | Freq: Every day | ORAL | 0 refills | Status: DC

## 2019-01-10 NOTE — Progress Notes (Signed)
BP 131/74 (BP Location: Right Arm, Patient Position: Sitting, Cuff Size: Normal)   Pulse (!) 106   Temp (!) 97.5 F (36.4 C) (Oral)   Ht 5\' 4"  (1.626 m)   Wt 112 lb 8 oz (51 kg)   SpO2 90%   BMI 19.31 kg/m    Subjective:    Patient ID: Kim Graham, female    DOB: 02/25/1951, 68 y.o.   MRN: [id number]  HPI: Kim Graham is a 68 y.o. female  Chief Complaint  Patient presents with  . Fatigue    Patient still feels extremely fatigued. Patient emotional and wants to feel better.   . Lung Recheck    1 week F/U   Here today for 1 week lung recheck for COPD exacerbation. Did not go get x-ray last week. States she felt better for a day or so on the zpak and prednisone but now feeling exhausted, frustrated, and still having SOB and productive cough. Very upset that she isn't able to return to work yet as she's still feeling bad. Has been compliant with home inhaler regimen. No fevers, N/V, sore throat, CP.   Relevant past medical, surgical, family and social history reviewed and updated as indicated. Interim medical history since our last visit reviewed. Allergies and medications reviewed and updated.  Review of Systems  Per HPI unless specifically indicated above     Objective:    BP 131/74 (BP Location: Right Arm, Patient Position: Sitting, Cuff Size: Normal)   Pulse (!) 106   Temp (!) 97.5 F (36.4 C) (Oral)   Ht 5\' 4"  (1.626 m)   Wt 112 lb 8 oz (51 kg)   SpO2 90%   BMI 19.31 kg/m   Wt Readings from Last 3 Encounters:  01/10/19 112 lb 8 oz (51 kg)  01/03/19 116 lb 3.2 oz (52.7 kg)  12/23/18 119 lb 6.4 oz (54.2 kg)    Physical Exam Vitals signs and nursing note reviewed.  Constitutional:      Appearance: Normal appearance.  HENT:     Head: Atraumatic.     Right Ear: Tympanic membrane and external ear normal.     Left Ear: Tympanic membrane and external ear normal.     Nose: Congestion present.     Mouth/Throat:     Mouth: Mucous membranes are moist.   Pharynx: Posterior oropharyngeal erythema present.  Eyes:     Extraocular Movements: Extraocular movements intact.     Conjunctiva/sclera: Conjunctivae normal.  Neck:     Musculoskeletal: Normal range of motion and neck supple.  Cardiovascular:     Rate and Rhythm: Normal rate and regular rhythm.     Heart sounds: Normal heart sounds.  Pulmonary:     Effort: Pulmonary effort is normal.     Breath sounds: Wheezing present. No rales.  Musculoskeletal: Normal range of motion.  Skin:    General: Skin is warm and dry.  Neurological:     Mental Status: She is alert and oriented to person, place, and time.  Psychiatric:     Comments: Tearful, frustrated     Results for orders placed or performed in visit on 12/23/18  Veritor Flu A/B Waived  Result Value Ref Range   Influenza A Negative Negative   Influenza B Negative Negative      Assessment & Plan:   Problem List Items Addressed This Visit      Respiratory   Chronic obstructive pulmonary disease (HCC) - Primary    Some temporary  improvement on zpak and prednisone, but worse since completion. Will start levaquin and a prednisone burst and continue to monitor closely. Recommended she present to the ER if worsening. Reminded her to go get her CXR as soon as possible. F/u in 1 week for lung recheck      Relevant Medications   predniSONE (DELTASONE) 20 MG tablet       Follow up plan: Return in about 1 week (around 01/17/2019) for Lung recheck.

## 2019-01-11 ENCOUNTER — Telehealth: Payer: Self-pay | Admitting: Family Medicine

## 2019-01-11 NOTE — Telephone Encounter (Signed)
Attempted to reach patient x 2 

## 2019-01-11 NOTE — Telephone Encounter (Signed)
Message relayed to patient. Verbalized understanding and denied questions.   

## 2019-01-11 NOTE — Telephone Encounter (Signed)
Please let her know that her chest x-ray came back negative for pneumonia and indicates that what she's dealing with is a COPD exacerbation. As discussed if she is worsening she should go to the ER, otherwise if improving she should come back for lung check in 1 week  Copied from CRM [id number]. Topic: General - Inquiry >> Jan 11, 2019 12:55 PM Baldo Daub L wrote: Reason for CRM:   Pt calling to get results of chest x-ray done yesterday.  Per NT, not available for release by provider yet.   Pt can be reached at [phone].

## 2019-01-12 ENCOUNTER — Telehealth: Payer: Self-pay | Admitting: Family Medicine

## 2019-01-12 NOTE — Telephone Encounter (Signed)
Kim Graham, you saw her on Tuesday

## 2019-01-12 NOTE — Assessment & Plan Note (Signed)
Some temporary improvement on zpak and prednisone, but worse since completion. Will start levaquin and a prednisone burst and continue to monitor closely. Recommended she present to the ER if worsening. Reminded her to go get her CXR as soon as possible. F/u in 1 week for lung recheck

## 2019-01-12 NOTE — Telephone Encounter (Signed)
Called patient. Very upset. She states she knows her body and she needs at least 4 more prednisone tablets. Pt states we do not want her better. Patient states she is going to over all our heads and call [employer].   Offered patient another OV for evaluation she declined. Patient was informed to go to E.R if getting worse/not improving. Pt verbalized understanding.

## 2019-01-12 NOTE — Telephone Encounter (Signed)
She is only 2 days into the script for 5 days I gave her. I've recommended she go to the hospital if she's this bad off several times now - she's already on her second round of steroids and antibiotics so if she's not improving she should go.

## 2019-01-12 NOTE — Telephone Encounter (Signed)
Sent to wrong person.

## 2019-01-12 NOTE — Telephone Encounter (Signed)
Pt presented in office to inquire about getting another prescription of prednisone states that she is still not well. Patient had an appointment with [facility] on 01/10/2019. Please advise. If able to would like to have it filled at [facility].

## 2019-01-18 DIAGNOSIS — H5213 Myopia, bilateral: Secondary | ICD-10-CM | POA: Diagnosis not present

## 2019-03-17 DIAGNOSIS — H5213 Myopia, bilateral: Secondary | ICD-10-CM | POA: Diagnosis not present

## 2019-03-20 ENCOUNTER — Telehealth: Payer: Self-pay | Admitting: Family Medicine

## 2019-03-20 ENCOUNTER — Ambulatory Visit: Payer: Medicare Other | Admitting: Family Medicine

## 2019-03-20 MED ORDER — ANORO ELLIPTA 62.5-25 MCG/INH IN AEPB: 1.0000 | INHALATION_SPRAY | Freq: Every day | RESPIRATORY_TRACT | 0 refills | Status: DC

## 2019-03-20 NOTE — Telephone Encounter (Signed)
Called pt to schedule virtual no answer, left voicemail.

## 2019-03-20 NOTE — Telephone Encounter (Signed)
Patient was on your schedule for today but had to reschedule it until next week but she does need a refill on her inhaler sent to [facility].   She has an appt 5-18   Thank you

## 2019-03-22 ENCOUNTER — Telehealth: Payer: Self-pay | Admitting: Family Medicine

## 2019-03-22 MED ORDER — ALBUTEROL SULFATE HFA 108 (90 BASE) MCG/ACT IN AERS: 2.0000 | INHALATION_SPRAY | Freq: Four times a day (QID) | RESPIRATORY_TRACT | 3 refills | Status: DC | PRN

## 2019-03-22 NOTE — Addendum Note (Signed)
Addended by: Roosvelt Maser E on: 03/22/2019 02:44 PM   Modules accepted: Orders

## 2019-03-22 NOTE — Telephone Encounter (Signed)
Pt called in and states that she also needs a refill on her albuterol inhaler as well. Pt has appt for Monday 03/27/2019.

## 2019-03-22 NOTE — Telephone Encounter (Signed)
Rx sent 

## 2019-03-27 ENCOUNTER — Ambulatory Visit (INDEPENDENT_AMBULATORY_CARE_PROVIDER_SITE_OTHER): Payer: Medicare Other | Admitting: Family Medicine

## 2019-03-27 ENCOUNTER — Encounter: Payer: Self-pay | Admitting: Family Medicine

## 2019-03-27 ENCOUNTER — Other Ambulatory Visit: Payer: Self-pay

## 2019-03-27 VITALS — BP 116/74 | HR 95 | Temp 98.3°F | Ht 64.0 in | Wt 126.0 lb

## 2019-03-27 DIAGNOSIS — F4381 Prolonged grief disorder: Secondary | ICD-10-CM

## 2019-03-27 DIAGNOSIS — F4329 Adjustment disorder with other symptoms: Secondary | ICD-10-CM | POA: Diagnosis not present

## 2019-03-27 DIAGNOSIS — Z634 Disappearance and death of family member: Secondary | ICD-10-CM | POA: Diagnosis not present

## 2019-03-27 DIAGNOSIS — Z8709 Personal history of other diseases of the respiratory system: Secondary | ICD-10-CM

## 2019-03-27 DIAGNOSIS — E119 Type 2 diabetes mellitus without complications: Secondary | ICD-10-CM

## 2019-03-27 DIAGNOSIS — F4321 Adjustment disorder with depressed mood: Secondary | ICD-10-CM

## 2019-03-27 DIAGNOSIS — J441 Chronic obstructive pulmonary disease with (acute) exacerbation: Secondary | ICD-10-CM

## 2019-03-27 DIAGNOSIS — R8290 Unspecified abnormal findings in urine: Secondary | ICD-10-CM | POA: Diagnosis not present

## 2019-03-27 MED ORDER — ARIPIPRAZOLE 15 MG PO TBDP: 15.0000 mg | ORAL_TABLET | Freq: Every day | ORAL | 1 refills | Status: DC

## 2019-03-27 MED ORDER — ANORO ELLIPTA 62.5-25 MCG/INH IN AEPB: 1.0000 | INHALATION_SPRAY | Freq: Every day | RESPIRATORY_TRACT | 3 refills | Status: DC

## 2019-03-27 MED ORDER — ALBUTEROL SULFATE HFA 108 (90 BASE) MCG/ACT IN AERS: 2.0000 | INHALATION_SPRAY | Freq: Four times a day (QID) | RESPIRATORY_TRACT | 3 refills | Status: DC | PRN

## 2019-03-27 NOTE — Assessment & Plan Note (Signed)
Under good control on current regimen. Continue current regimen. Continue to monitor. Call with any concerns. Refills given. Call with any concerns.  

## 2019-03-27 NOTE — Progress Notes (Signed)
BP 116/74   Pulse 95   Temp 98.3 F (36.8 C) (Oral)   Ht  (1.626 m)   Wt 126 lb (57.2 kg)   SpO2 97%   BMI 21.63 kg/m    Subjective:    Patient ID: Kim Graham, female    DOB: September 09, 1951, 68 y.o.   MRN: [id number]  HPI: Kim Graham is a 68 y.o. female  Chief Complaint  Patient presents with  . Diabetes    72m f/u  . COPD  . grief   DIABETES Hypoglycemic episodes:no Polydipsia/polyuria: no Visual disturbance: no Chest pain: no Paresthesias: no Glucose Monitoring: no  Accucheck frequency: Not Checking Taking Insulin?: no Blood Pressure Monitoring: not checking Retinal Examination: Up to Date Foot Exam: Up to Date Diabetic Education: Completed Pneumovax: Up to Date Influenza: Up to Date Aspirin: no  COPD COPD status: controlled Satisfied with current treatment?: yes Oxygen use: no Dyspnea frequency: occasionally Cough frequency: occasionally Rescue inhaler frequency:  rarely Limitation of activity: no Productive cough: rarely Pneumovax: Up to Date Influenza: Up to Date  GRIEF Mood status: controlled Satisfied with current treatment?: yes Symptom severity: mild  Duration of current treatment : chronic Side effects: no Medication compliance: excellent compliance Psychotherapy/counseling: no  Previous psychiatric medications: abilify Depressed mood: no Anxious mood: no Anhedonia: no Significant weight loss or gain: no Insomnia: no  Fatigue: no Feelings of worthlessness or guilt: no Impaired concentration/indecisiveness: no Suicidal ideations: no Hopelessness: no Crying spells: no Depression screen [facility] 2/9 03/27/2019 09/19/2018 05/20/2018 03/18/2018 03/18/2018  Decreased Interest 0 0 0 1 1  Down, Depressed, Hopeless 0 0 0 0 0  PHQ - 2 Score 0 0 0 1 1  Altered sleeping Tired, decreased energy 2 0 Change in appetite 0 0 0 1 1  Feeling bad or failure about yourself  0 0 1 0 0  Trouble concentrating 0 0 0 0 0  Moving  slowly or fidgety/restless 0 0 0 0 0  Suicidal thoughts 0 0 0 0 0  PHQ-9 Score Difficult doing work/chores - - Not difficult at all Somewhat difficult Not difficult at all     Relevant past medical, surgical, family and social history reviewed and updated as indicated. Interim medical history since our last visit reviewed. Allergies and medications reviewed and updated.  Review of Systems  Constitutional: Negative.   HENT: Negative.   Respiratory: Negative.   Cardiovascular: Positive for leg swelling. Negative for chest pain and palpitations.  Gastrointestinal: Negative.   Neurological: Negative.   Psychiatric/Behavioral: Negative.     Per HPI unless specifically indicated above     Objective:    BP 116/74   Pulse 95   Temp 98.3 F (36.8 C) (Oral)   Ht  (1.626 m)   Wt 126 lb (57.2 kg)   SpO2 97%   BMI 21.63 kg/m   Wt Readings from Last 3 Encounters:  03/27/19 126 lb (57.2 kg)  01/10/19 112 lb 8 oz (51 kg)  01/03/19 116 lb 3.2 oz (52.7 kg)    Physical Exam Vitals signs and nursing note reviewed.  Constitutional:      General: She is not in acute distress.    Appearance: Normal appearance. She is not ill-appearing, toxic-appearing or diaphoretic.  HENT:     Head: Normocephalic and atraumatic.     Right Ear: External ear normal.     Left Ear:  External ear normal.     Nose: Nose normal.     Mouth/Throat:     Mouth: Mucous membranes are moist.     Pharynx: Oropharynx is clear.  Eyes:     General: No scleral icterus.       Right eye: No discharge.        Left eye: No discharge.     Extraocular Movements: Extraocular movements intact.     Conjunctiva/sclera: Conjunctivae normal.     Pupils: Pupils are equal, round, and reactive to light.  Neck:     Musculoskeletal: Normal range of motion and neck supple.  Cardiovascular:     Rate and Rhythm: Normal rate and regular rhythm.     Pulses: Normal pulses.     Heart sounds: Normal heart sounds. No  murmur. No friction rub. No gallop.   Pulmonary:     Effort: Pulmonary effort is normal. No respiratory distress.     Breath sounds: Normal breath sounds. No stridor. No wheezing, rhonchi or rales.  Chest:     Chest wall: No tenderness.  Musculoskeletal: Normal range of motion.  Skin:    General: Skin is warm and dry.     Capillary Refill: Capillary refill takes less than 2 seconds.     Coloration: Skin is not jaundiced or pale.     Findings: No bruising, erythema, lesion or rash.  Neurological:     General: No focal deficit present.     Mental Status: She is alert and oriented to person, place, and time. Mental status is at baseline.  Psychiatric:        Mood and Affect: Mood normal.        Behavior: Behavior normal.        Thought Content: Thought content normal.        Judgment: Judgment normal.     Results for orders placed or performed in visit on 12/23/18  Veritor Flu A/B Waived  Result Value Ref Range   Influenza A Negative Negative   Influenza B Negative Negative      Assessment & Plan:   Problem List Items Addressed This Visit      Respiratory   Chronic obstructive pulmonary disease (HCC)    Under good control on current regimen. Continue current regimen. Continue to monitor. Call with any concerns. Refills given. Call with any concerns.        Relevant Medications   ANORO ELLIPTA 62.5-25 MCG/INH AEPB   albuterol (VENTOLIN HFA) 108 (90 Base) MCG/ACT inhaler   Other Relevant Orders   TSH     Endocrine   Diabetes mellitus type 2, diet-controlled (HCC) - Primary    Under good control with a1c of 6.1. Continue current regimen. Continue to monitor. Call with any concerns.       Relevant Orders   Bayer DCA Hb A1c Waived   Comprehensive metabolic panel   Lipid Panel w/o Chol/HDL Ratio   Microalbumin, Urine Waived   TSH   UA/M w/rflx Culture, Routine   CBC with Differential/Platelet     Other   Complicated grief    Under good control on the abilify. Call  with any concerns. Continue to monitor. Refills given today.      Relevant Orders   TSH    Other Visit Diagnoses    History of URI (upper respiratory infection)       Very sick in February. Unlikely COVID- but would like to be tested. Antibiodies ordered today.   Relevant Orders  SAR CoV2 Serology (COVID 19)AB(IGG)IA       Follow up plan: Return in about 6 months (around 09/27/2019) for Physical.

## 2019-03-27 NOTE — Assessment & Plan Note (Signed)
Under good control on the abilify. Call with any concerns. Continue to monitor. Refills given today.

## 2019-03-27 NOTE — Assessment & Plan Note (Signed)
Under good control with a1c of 6.1. Continue current regimen. Continue to monitor. Call with any concerns.

## 2019-03-28 ENCOUNTER — Encounter: Payer: Self-pay | Admitting: Family Medicine

## 2019-03-28 LAB — COMPREHENSIVE METABOLIC PANEL
ALT: 38 IU/L — ABNORMAL HIGH (ref 0–32)
AST: 45 IU/L — ABNORMAL HIGH (ref 0–40)
Albumin/Globulin Ratio: 1.5 (ref 1.2–2.2)
Albumin: 4.1 g/dL (ref 3.8–4.8)
Alkaline Phosphatase: 115 IU/L (ref 39–117)
BUN/Creatinine Ratio: 20 (ref 12–28)
BUN: 17 mg/dL (ref 8–27)
Bilirubin Total: 0.4 mg/dL (ref 0.0–1.2)
CO2: 27 mmol/L (ref 20–29)
Calcium: 9.8 mg/dL (ref 8.7–10.3)
Chloride: 101 mmol/L (ref 96–106)
Creatinine, Ser: 0.85 mg/dL (ref 0.57–1.00)
GFR calc Af Amer: 82 mL/min/{1.73_m2} (ref >59)
GFR calc non Af Amer: 71 mL/min/{1.73_m2} (ref >59)
Globulin, Total: 2.7 g/dL (ref 1.5–4.5)
Glucose: 101 mg/dL — ABNORMAL HIGH (ref 65–99)
Potassium: 4.4 mmol/L (ref 3.5–5.2)
Sodium: 141 mmol/L (ref 134–144)
Total Protein: 6.8 g/dL (ref 6.0–8.5)

## 2019-03-28 LAB — CBC WITH DIFFERENTIAL/PLATELET
Basophils Absolute: 0.1 10*3/uL (ref 0.0–0.2)
Basos: 1 %
EOS (ABSOLUTE): 0.2 10*3/uL (ref 0.0–0.4)
Eos: 2 %
Hematocrit: 45.5 % (ref 34.0–46.6)
Hemoglobin: 15.4 g/dL (ref 11.1–15.9)
Immature Grans (Abs): 0 10*3/uL (ref 0.0–0.1)
Immature Granulocytes: 0 %
Lymphocytes Absolute: 2.6 10*3/uL (ref 0.7–3.1)
Lymphs: 33 %
MCH: 30 pg (ref 26.6–33.0)
MCHC: 33.8 g/dL (ref 31.5–35.7)
MCV: 89 fL (ref 79–97)
Monocytes Absolute: 0.8 10*3/uL (ref 0.1–0.9)
Monocytes: 10 %
Neutrophils Absolute: 4.4 10*3/uL (ref 1.4–7.0)
Neutrophils: 54 %
Platelets: 159 10*3/uL (ref 150–450)
RBC: 5.13 x10E6/uL (ref 3.77–5.28)
RDW: 14.4 % (ref 11.7–15.4)
WBC: 8 10*3/uL (ref 3.4–10.8)

## 2019-03-28 LAB — LIPID PANEL W/O CHOL/HDL RATIO
Cholesterol, Total: 150 mg/dL (ref 100–199)
HDL: 87 mg/dL (ref >39)
LDL Calculated: 50 mg/dL (ref 0–99)
Triglycerides: 67 mg/dL (ref 0–149)
VLDL Cholesterol Cal: 13 mg/dL (ref 5–40)

## 2019-03-28 LAB — SAR COV2 SEROLOGY (COVID19)AB(IGG),IA: SARS-CoV-2 Ab, IgG: NEGATIVE

## 2019-03-28 LAB — TSH: TSH: 2.3 u[IU]/mL (ref 0.450–4.500)

## 2019-03-29 LAB — UA/M W/RFLX CULTURE, ROUTINE
Bilirubin, UA: NEGATIVE
Glucose, UA: NEGATIVE
Ketones, UA: NEGATIVE
Nitrite, UA: NEGATIVE
Protein,UA: NEGATIVE
Specific Gravity, UA: 1.025 (ref 1.005–1.030)
Urobilinogen, Ur: 2 mg/dL — ABNORMAL HIGH (ref 0.2–1.0)
pH, UA: 6 (ref 5.0–7.5)

## 2019-03-29 LAB — BAYER DCA HB A1C WAIVED: HB A1C (BAYER DCA - WAIVED): 6.1 % (ref <7.0)

## 2019-03-29 LAB — MICROSCOPIC EXAMINATION

## 2019-03-29 LAB — MICROALBUMIN, URINE WAIVED
Creatinine, Urine Waived: 50 mg/dL (ref 10–300)
Microalb, Ur Waived: 80 mg/L — ABNORMAL HIGH (ref 0–19)

## 2019-03-29 LAB — URINE CULTURE, REFLEX

## 2019-06-07 ENCOUNTER — Encounter: Payer: Self-pay | Admitting: Family Medicine

## 2019-06-07 ENCOUNTER — Ambulatory Visit (INDEPENDENT_AMBULATORY_CARE_PROVIDER_SITE_OTHER): Payer: Medicare Other | Admitting: Family Medicine

## 2019-06-07 ENCOUNTER — Other Ambulatory Visit: Payer: Self-pay

## 2019-06-07 VITALS — BP 135/76 | HR 106 | Temp 98.9°F

## 2019-06-07 DIAGNOSIS — F4321 Adjustment disorder with depressed mood: Secondary | ICD-10-CM

## 2019-06-07 DIAGNOSIS — F4329 Adjustment disorder with other symptoms: Secondary | ICD-10-CM

## 2019-06-07 DIAGNOSIS — N941 Unspecified dyspareunia: Secondary | ICD-10-CM | POA: Diagnosis not present

## 2019-06-07 DIAGNOSIS — Z634 Disappearance and death of family member: Secondary | ICD-10-CM

## 2019-06-07 MED ORDER — PREMARIN 0.625 MG/GM VA CREA: 1.0000 | TOPICAL_CREAM | Freq: Every day | VAGINAL | 11 refills | Status: DC | PRN

## 2019-06-07 MED ORDER — ARIPIPRAZOLE 15 MG PO TBDP: 15.0000 mg | ORAL_TABLET | Freq: Every day | ORAL | 1 refills | Status: DC

## 2019-06-07 NOTE — Progress Notes (Signed)
BP 135/76   Pulse (!) 106   Temp 98.9 F (37.2 C)   SpO2 97%    Subjective:    Patient ID: Kim Graham, female    DOB: 1951-10-03, 68 y.o.   MRN: [id number]  HPI: Kim MaudlinMartha L Graham is a 68 y.o. female  Chief Complaint  Patient presents with  . vaginal dryness  . Medication Refill    Abilify    Patient presenting today to discuss vaginal irritation, dryness, and dyspareunia. States she has a new partner and has not been sexually active in a while. This happened years ago and she was given premarin cream which worked very well for her. Would like to have more for as needed use. Denies vaginal bleeding, discharge, odor, dysuria, rashes.   Also needing refill on her abilify, states it's going very well for her. Moods are stable, no significant low mood periods. Denies SI/HI.   [facility] 06/07/2019 03/27/2019 09/19/2018  Decreased Interest 0 0 0  Down, Depressed, Hopeless 0 0 0  PHQ - 2 Score 0 0 0  Altered sleeping 2 1 1   Tired, decreased energy 2 2 0  Change in appetite 1 0 0  Feeling bad or failure about yourself  2 0 0  Trouble concentrating 1 0 0  Moving slowly or fidgety/restless 0 0 0  Suicidal thoughts 0 0 0  PHQ-9 Score 8 3 1   Difficult doing work/chores - - -    Relevant past medical, surgical, family and social history reviewed and updated as indicated. Interim medical history since our last visit reviewed. Allergies and medications reviewed and updated.  Review of Systems  Per HPI unless specifically indicated above     Objective:    BP 135/76   Pulse (!) 106   Temp 98.9 F (37.2 C)   SpO2 97%   Wt Readings from Last 3 Encounters:  03/27/19 126 lb (57.2 kg)  01/10/19 112 lb 8 oz (51 kg)  01/03/19 116 lb 3.2 oz (52.7 kg)    Physical Exam Vitals signs and nursing note reviewed.  Constitutional:      Appearance: Normal appearance. She is not ill-appearing.  HENT:     Head: Atraumatic.  Eyes:     Extraocular Movements: Extraocular  movements intact.     Conjunctiva/sclera: Conjunctivae normal.  Neck:     Musculoskeletal: Normal range of motion and neck supple.  Cardiovascular:     Rate and Rhythm: Normal rate and regular rhythm.     Heart sounds: Normal heart sounds.  Pulmonary:     Effort: Pulmonary effort is normal.     Breath sounds: Normal breath sounds.  Genitourinary:    Comments: GU exam declined Musculoskeletal: Normal range of motion.  Skin:    General: Skin is warm and dry.  Neurological:     Mental Status: She is alert and oriented to person, place, and time.  Psychiatric:        Mood and Affect: Mood normal.        Thought Content: Thought content normal.        Judgment: Judgment normal.     Results for orders placed or performed in visit on 03/27/19  SAR CoV2 Serology (COVID 19)AB(IGG)IA   Specimen: Blood  Result Value Ref Range   Abbott SARS-CoV-2 Ab, IgG Negative Negative  Microscopic Examination   BLD  Result Value Ref Range   WBC, UA 6-10 (A) 0 - 5 /hpf   RBC 3-10 (A) 0 -  2 /hpf   Epithelial Cells (non renal) 0-10 0 - 10 /hpf   Bacteria, UA Few (A) None seen/Few  Urine Culture, Reflex   BLD  Result Value Ref Range   Urine Culture, Routine Final report    Organism ID, Bacteria Comment   Bayer DCA Hb A1c Waived  Result Value Ref Range   HB A1C (BAYER DCA - WAIVED) 6.1 <7.0 %  Comprehensive metabolic panel  Result Value Ref Range   Glucose 101 (H) 65 - 99 mg/dL   BUN 17 8 - 27 mg/dL   Creatinine, Ser 1.610.85 0.57 - 1.00 mg/dL   GFR calc non Af Amer 71 >59 mL/min/1.73   GFR calc Af Amer 82 >59 mL/min/1.73   BUN/Creatinine Ratio 20 12 - 28   Sodium 141 134 - 144 mmol/L   Potassium 4.4 3.5 - 5.2 mmol/L   Chloride 101 96 - 106 mmol/L   CO2 27 20 - 29 mmol/L   Calcium 9.8 8.7 - 10.3 mg/dL   Total Protein 6.8 6.0 - 8.5 g/dL   Albumin 4.1 3.8 - 4.8 g/dL   Globulin, Total 2.7 1.5 - 4.5 g/dL   Albumin/Globulin Ratio 1.5 1.2 - 2.2   Bilirubin Total 0.4 0.0 - 1.2 mg/dL   Alkaline  Phosphatase 115 39 - 117 IU/L   AST 45 (H) 0 - 40 IU/L   ALT 38 (H) 0 - 32 IU/L  Lipid Panel w/o Chol/HDL Ratio  Result Value Ref Range   Cholesterol, Total 150 100 - 199 mg/dL   Triglycerides 67 0 - 149 mg/dL   HDL 87 >09>39 mg/dL   VLDL Cholesterol Cal 13 5 - 40 mg/dL   LDL Calculated 50 0 - 99 mg/dL  Microalbumin, Urine Waived  Result Value Ref Range   Microalb, Ur Waived 80 (H) 0 - 19 mg/L   Creatinine, Urine Waived 50 10 - 300 mg/dL   Microalb/Creat Ratio 30-300 (H) <30 mg/g  TSH  Result Value Ref Range   TSH 2.300 0.450 - 4.500 uIU/mL  UA/M w/rflx Culture, Routine   Specimen: Blood   BLD  Result Value Ref Range   Specific Gravity, UA 1.025 1.005 - 1.030   pH, UA 6.0 5.0 - 7.5   Color, UA Yellow Yellow   Appearance Ur Clear Clear   Leukocytes,UA 1+ (A) Negative   Protein,UA Negative Negative/Trace   Glucose, UA Negative Negative   Ketones, UA Negative Negative   RBC, UA 2+ (A) Negative   Bilirubin, UA Negative Negative   Urobilinogen, Ur 2.0 (H) 0.2 - 1.0 mg/dL   Nitrite, UA Negative Negative   Microscopic Examination See below:    Urinalysis Reflex Comment   CBC with Differential/Platelet  Result Value Ref Range   WBC 8.0 3.4 - 10.8 x10E3/uL   RBC 5.13 3.77 - 5.28 x10E6/uL   Hemoglobin 15.4 11.1 - 15.9 g/dL   Hematocrit 60.445.5 54.034.0 - 46.6 %   MCV 89 79 - 97 fL   MCH 30.0 26.6 - 33.0 pg   MCHC 33.8 31.5 - 35.7 g/dL   RDW 98.114.4 19.111.7 - 47.815.4 %   Platelets 159 150 - 450 x10E3/uL   Neutrophils 54 Not Estab. %   Lymphs 33 Not Estab. %   Monocytes 10 Not Estab. %   Eos 2 Not Estab. %   Basos 1 Not Estab. %   Neutrophils Absolute 4.4 1.4 - 7.0 x10E3/uL   Lymphocytes Absolute 2.6 0.7 - 3.1 x10E3/uL   Monocytes Absolute 0.8 0.1 -  0.9 x10E3/uL   EOS (ABSOLUTE) 0.2 0.0 - 0.4 x10E3/uL   Basophils Absolute 0.1 0.0 - 0.2 x10E3/uL   Immature Granulocytes 0 Not Estab. %   Immature Grans (Abs) 0.0 0.0 - 0.1 x10E3/uL      Assessment & Plan:   Problem List Items Addressed  This Visit      Other   Complicated grief    Abilify refilled and controlling moods well. Continue current regimen       Other Visit Diagnoses    Dyspareunia, female    -  Primary   Post-menopausal. Premarin cream prn, advised to use lubricants       Follow up plan: Return for as scheduled.

## 2019-06-07 NOTE — Assessment & Plan Note (Signed)
Abilify refilled and controlling moods well. Continue current regimen

## 2019-09-22 ENCOUNTER — Encounter: Payer: Medicare Other | Admitting: Family Medicine

## 2019-10-02 ENCOUNTER — Ambulatory Visit (INDEPENDENT_AMBULATORY_CARE_PROVIDER_SITE_OTHER): Payer: Medicare Other

## 2019-10-02 ENCOUNTER — Other Ambulatory Visit: Payer: Self-pay

## 2019-10-02 DIAGNOSIS — Z23 Encounter for immunization: Secondary | ICD-10-CM

## 2019-10-28 ENCOUNTER — Other Ambulatory Visit: Payer: Self-pay | Admitting: Family Medicine

## 2019-10-29 NOTE — Telephone Encounter (Signed)
Requested medication (s) are due for refill today: yes  Requested medication (s) are on the active medication list: yes  Last refill:  10/02/2019  Future visit scheduled: no  Notes to clinic:  One inhaler should last at least one month. If the patient is requesting refills earlier, contact the patient to check for uncontrolled symptoms   Requested Prescriptions  Pending Prescriptions Disp Refills   albuterol (VENTOLIN HFA) 108 (90 Base) MCG/ACT inhaler [Pharmacy Med Name: ALBUTEROL HFA 90 MCG INHALER] 6.7 g 0    Sig: Inhale 2 puffs into the lungs every 6 (six) hours as needed for wheezing or shortness of breath.      Pulmonology:  Beta Agonists Failed - 10/28/2019  1:32 PM      Failed - One inhaler should last at least one month. If the patient is requesting refills earlier, contact the patient to check for uncontrolled symptoms.      Passed - Valid encounter within last 12 months    Recent Outpatient Visits           4 months ago Dyspareunia, female   [place], [place], [place]   7 months ago Diabetes mellitus type 2, diet-controlled [facility])   [place], Megan P, DO   9 months ago Chronic obstructive pulmonary disease with acute exacerbation [facility])   [place], [place], [place]   9 months ago Chronic obstructive pulmonary disease with acute exacerbation [facility])   [facility] [place], [place]   10 months ago [place], [place], [place]

## 2019-10-30 ENCOUNTER — Ambulatory Visit (INDEPENDENT_AMBULATORY_CARE_PROVIDER_SITE_OTHER): Payer: Medicare Other | Admitting: Family Medicine

## 2019-10-30 ENCOUNTER — Other Ambulatory Visit: Payer: Self-pay

## 2019-10-30 ENCOUNTER — Ambulatory Visit: Payer: Self-pay

## 2019-10-30 ENCOUNTER — Encounter: Payer: Self-pay | Admitting: Family Medicine

## 2019-10-30 VITALS — Ht 64.0 in | Wt 130.0 lb

## 2019-10-30 DIAGNOSIS — J441 Chronic obstructive pulmonary disease with (acute) exacerbation: Secondary | ICD-10-CM

## 2019-10-30 MED ORDER — PREDNISONE 20 MG PO TABS: 40.0000 mg | ORAL_TABLET | Freq: Every day | ORAL | 0 refills | Status: DC

## 2019-10-30 MED ORDER — BENZONATATE 200 MG PO CAPS: 200.0000 mg | ORAL_CAPSULE | Freq: Three times a day (TID) | ORAL | 0 refills | Status: DC | PRN

## 2019-10-30 MED ORDER — AZITHROMYCIN 250 MG PO TABS: ORAL_TABLET | ORAL | 0 refills | Status: DC

## 2019-10-30 NOTE — Telephone Encounter (Signed)
Noted  

## 2019-10-30 NOTE — Telephone Encounter (Signed)
Pt. Reports she has been around her friend "who has the crud." Concerned she is getting bronchitis and has a history of COPD. Reports she has "some chest tightness." "Not really pain." Pain 1/10, lasts 1 minute in the middle of her chest. Does not radiate, no shortness of breath, nausea or dizziness. Virtual visit scheduled for today.  Reason for Disposition . [1] Chest pain lasts > 5 minutes AND [2] occurred > 3 days ago (72 hours) AND [3] NO chest pain or cardiac symptoms now  Answer Assessment - Initial Assessment Questions 1. LOCATION: "Where does it hurt?"       Hurts in the middle 2. RADIATION: "Does the pain go anywhere else?" (e.g., into neck, jaw, arms, back)     No 3. ONSET: "When did the chest pain begin?" (Minutes, hours or days)      Yesterday 4. PATTERN "Does the pain come and go, or has it been constant since it started?"  "Does it get worse with exertion?"      Comes and goes 5. DURATION: "How long does it last" (e.g., seconds, minutes, hours)     Lasts a second 6. SEVERITY: "How bad is the pain?"  (e.g., Scale 1-10; mild, moderate, or severe)    - MILD (1-3): doesn't interfere with normal activities     - MODERATE (4-7): interferes with normal activities or awakens from sleep    - SEVERE (8-10): excruciating pain, unable to do any normal activities       1 7. CARDIAC RISK FACTORS: "Do you have any history of heart problems or risk factors for heart disease?" (e.g., angina, prior heart attack; diabetes, high blood pressure, high cholesterol, smoker, or strong family history of heart disease)     No 8. PULMONARY RISK FACTORS: "Do you have any history of lung disease?"  (e.g., blood clots in lung, asthma, emphysema, birth control pills)     COPD 9. CAUSE: "What do you think is causing the chest pain?"     Bronchitis 10. OTHER SYMPTOMS: "Do you have any other symptoms?" (e.g., dizziness, nausea, vomiting, sweating, fever, difficulty breathing, cough)       Mild cough 11.  PREGNANCY: "Is there any chance you are pregnant?" "When was your last menstrual period?"       No  Protocols used: CHEST PAIN-A-AH

## 2019-10-30 NOTE — Progress Notes (Signed)
Ht 5\' 4"  (1.626 m)   Wt 130 lb (59 kg)   BMI 22.31 kg/m    Subjective:    Patient ID: Kim Graham, female    DOB: 27-May-1951, 68 y.o.   MRN: [id number]  HPI: Kim Graham is a 68 y.o. female  Chief Complaint  Patient presents with  . Cough    chest tightness sice yesterday    . This visit was completed via telephone due to the restrictions of the COVID-19 pandemic. All issues as above were discussed and addressed. Physical exam was done as above through visual confirmation on telephone. If it was felt that the patient should be evaluated in the office, they were directed there. The patient verbally consented to this visit. . Location of the patient: parked car . Location of the provider: work . Those involved with this call:  . Provider: Roosvelt Maserachel Baden Betsch, PA-C . CMA: Elton SinAnita Quito, CMA . Front Desk/Registration: Harriet PhoJoliza Johnson  . Time spent on call: 15 minutes on the phone discussing health concerns. 5 minutes total spent in review of patient's record and preparation of their chart. I verified patient identity using two factors (patient name and date of birth). Patient consents verbally to being seen via telemedicine visit today.   Presenting today with 2 days of chest tightness, hoarse voice, mild cough. States every winter she gets bronchitis that starts like this and it tends to get very bad if she does not get on something right away. Inhalers helping and taking sudafed sometimes. Denies fever, chills, CP, SOB, N/V/D, loss of taste or smell, sick contacts.   Relevant past medical, surgical, family and social history reviewed and updated as indicated. Interim medical history since our last visit reviewed. Allergies and medications reviewed and updated.  Review of Systems  Per HPI unless specifically indicated above     Objective:    Ht 5\' 4"  (1.626 m)   Wt 130 lb (59 kg)   BMI 22.31 kg/m   Wt Readings from Last 3 Encounters:  10/30/19 130 lb (59 kg)  03/27/19 126 lb  (57.2 kg)  01/10/19 112 lb 8 oz (51 kg)    Physical Exam  Unable to perform PE due to patient lack of access to video technology for today's visit  Results for orders placed or performed in visit on 03/27/19  SAR CoV2 Serology (COVID 19)AB(IGG)IA  Result Value Ref Range   SARS-CoV-2 Ab, IgG Negative Negative  Microscopic Examination   BLD  Result Value Ref Range   WBC, UA 6-10 (A) 0 - 5 /hpf   RBC 3-10 (A) 0 - 2 /hpf   Epithelial Cells (non renal) 0-10 0 - 10 /hpf   Bacteria, UA Few (A) None seen/Few  Urine Culture, Reflex   BLD  Result Value Ref Range   Urine Culture, Routine Final report    Organism ID, Bacteria Comment   Bayer DCA Hb A1c Waived  Result Value Ref Range   HB A1C (BAYER DCA - WAIVED) 6.1 <7.0 %  Comprehensive metabolic panel  Result Value Ref Range   Glucose 101 (H) 65 - 99 mg/dL   BUN 17 8 - 27 mg/dL   Creatinine, Ser 4.090.85 0.57 - 1.00 mg/dL   GFR calc non Af Amer 71 >59 mL/min/1.73   GFR calc Af Amer 82 >59 mL/min/1.73   BUN/Creatinine Ratio 20 12 - 28   Sodium 141 134 - 144 mmol/L   Potassium 4.4 3.5 - 5.2 mmol/L   Chloride 101  96 - 106 mmol/L   CO2 27 20 - 29 mmol/L   Calcium 9.8 8.7 - 10.3 mg/dL   Total Protein 6.8 6.0 - 8.5 g/dL   Albumin 4.1 3.8 - 4.8 g/dL   Globulin, Total 2.7 1.5 - 4.5 g/dL   Albumin/Globulin Ratio 1.5 1.2 - 2.2   Bilirubin Total 0.4 0.0 - 1.2 mg/dL   Alkaline Phosphatase 115 39 - 117 IU/L   AST 45 (H) 0 - 40 IU/L   ALT 38 (H) 0 - 32 IU/L  Lipid Panel w/o Chol/HDL Ratio  Result Value Ref Range   Cholesterol, Total 150 100 - 199 mg/dL   Triglycerides 67 0 - 149 mg/dL   HDL 87 >06 mg/dL   VLDL Cholesterol Cal 13 5 - 40 mg/dL   LDL Calculated 50 0 - 99 mg/dL  Microalbumin, Urine Waived  Result Value Ref Range   Microalb, Ur Waived 80 (H) 0 - 19 mg/L   Creatinine, Urine Waived 50 10 - 300 mg/dL   Microalb/Creat Ratio 30-300 (H) <30 mg/g  TSH  Result Value Ref Range   TSH 2.300 0.450 - 4.500 uIU/mL  UA/M w/rflx  Culture, Routine   Specimen: Blood   BLD  Result Value Ref Range   Specific Gravity, UA 1.025 1.005 - 1.030   pH, UA 6.0 5.0 - 7.5   Color, UA Yellow Yellow   Appearance Ur Clear Clear   Leukocytes,UA 1+ (A) Negative   Protein,UA Negative Negative/Trace   Glucose, UA Negative Negative   Ketones, UA Negative Negative   RBC, UA 2+ (A) Negative   Bilirubin, UA Negative Negative   Urobilinogen, Ur 2.0 (H) 0.2 - 1.0 mg/dL   Nitrite, UA Negative Negative   Microscopic Examination See below:    Urinalysis Reflex Comment   CBC with Differential/Platelet  Result Value Ref Range   WBC 8.0 3.4 - 10.8 x10E3/uL   RBC 5.13 3.77 - 5.28 x10E6/uL   Hemoglobin 15.4 11.1 - 15.9 g/dL   Hematocrit 23.7 62.8 - 46.6 %   MCV 89 79 - 97 fL   MCH 30.0 26.6 - 33.0 pg   MCHC 33.8 31.5 - 35.7 g/dL   RDW 31.5 17.6 - 16.0 %   Platelets 159 150 - 450 x10E3/uL   Neutrophils 54 Not Estab. %   Lymphs 33 Not Estab. %   Monocytes 10 Not Estab. %   Eos 2 Not Estab. %   Basos 1 Not Estab. %   Neutrophils Absolute 4.4 1.4 - 7.0 x10E3/uL   Lymphocytes Absolute 2.6 0.7 - 3.1 x10E3/uL   Monocytes Absolute 0.8 0.1 - 0.9 x10E3/uL   EOS (ABSOLUTE) 0.2 0.0 - 0.4 x10E3/uL   Basophils Absolute 0.1 0.0 - 0.2 x10E3/uL   Immature Granulocytes 0 Not Estab. %   Immature Grans (Abs) 0.0 0.0 - 0.1 x10E3/uL      Assessment & Plan:   Problem List Items Addressed This Visit      Respiratory   Chronic obstructive pulmonary disease (HCC) - Primary    Declines COVID testing, will start prednisone, tessalon perles and OTC cold and cough medications. If not improving or worsening over the week, can start the azithromycin. Supportive home care reviewed. Return if not improving or worsening. Continue inhaler regimen      Relevant Medications   predniSONE (DELTASONE) 20 MG tablet   azithromycin (ZITHROMAX) 250 MG tablet   benzonatate (TESSALON) 200 MG capsule       Follow up plan: Return if symptoms  worsen or fail to  improve.

## 2019-10-30 NOTE — Assessment & Plan Note (Addendum)
Declines COVID testing, will start prednisone, tessalon perles and OTC cold and cough medications. If not improving or worsening over the week, can start the azithromycin. Supportive home care reviewed. Return if not improving or worsening. Continue inhaler regimen

## 2019-11-07 ENCOUNTER — Telehealth: Payer: Self-pay | Admitting: Family Medicine

## 2019-11-07 NOTE — Chronic Care Management (AMB) (Signed)
  Chronic Care Management   Note  11/07/2019 Name: Kim Graham MRN: [id number] DOB: [birth year]  Forbes Cellar Abram is a 68 y.o. year old female who is a primary care patient of Valerie Roys, DO. I reached out to [employer] by phone today in response to a referral sent by Ms. Forbes Cellar Barb's health plan.     Ms. Pescador was given information about Chronic Care Management services today including:  1. CCM service includes personalized support from designated clinical staff supervised by her physician, including individualized plan of care and coordination with other care providers 2. 24/7 contact phone numbers for assistance for urgent and routine care needs. 3. Service will only be billed when office clinical staff spend 20 minutes or more in a month to coordinate care. 4. Only one practitioner may furnish and bill the service in a calendar month. 5. The patient may stop CCM services at any time (effective at the end of the month) by phone call to the office staff. 6. The patient will be responsible for cost sharing (co-pay) of up to 20% of the service fee (after annual deductible is met).  Patient did not agree to enrollment in care management services and does not wish to consider at this time.  Follow up plan: The patient has been provided with contact information for the chronic care management team and has been advised to call with any health related questions or concerns.   [place], [place] [id number] Direct Dial: [email or web address]  Website: [email or web address]

## 2020-02-27 ENCOUNTER — Other Ambulatory Visit: Payer: Self-pay

## 2020-02-27 ENCOUNTER — Other Ambulatory Visit: Payer: Self-pay | Admitting: Family Medicine

## 2020-02-27 ENCOUNTER — Encounter: Payer: Self-pay | Admitting: Family Medicine

## 2020-02-27 ENCOUNTER — Ambulatory Visit (INDEPENDENT_AMBULATORY_CARE_PROVIDER_SITE_OTHER): Payer: Medicare Other | Admitting: Family Medicine

## 2020-02-27 DIAGNOSIS — K047 Periapical abscess without sinus: Secondary | ICD-10-CM

## 2020-02-27 MED ORDER — AMOXICILLIN-POT CLAVULANATE 875-125 MG PO TABS: 1.0000 | ORAL_TABLET | Freq: Two times a day (BID) | ORAL | 0 refills | Status: DC

## 2020-02-27 NOTE — Progress Notes (Signed)
There were no vitals taken for this visit.   Subjective:    Patient ID: Kim Graham, female    DOB: Nov 21, 1950, 69 y.o.   MRN: [id number]  HPI: Kim Graham is a 69 y.o. female  Chief Complaint  Patient presents with  . Jaw Pain    Knot on jaw-Right side.   Has had a knot on her neck right below her jaw line on the R side. Has had some chills and sweats yesterday, but doesn't have a thermometer. Has been taking some advil. Lump is pretty big- about the size of an egg. She has a bad bottom tooth. She doesn't have a dentist. No bad taste, no SOB, No numbness or tingling. She is otherwise feeling OK, but concerned about this lump.  Relevant past medical, surgical, family and social history reviewed and updated as indicated. Interim medical history since our last visit reviewed. Allergies and medications reviewed and updated.  Review of Systems  Constitutional: Negative.   HENT: Positive for dental problem. Negative for congestion, drooling, ear discharge, ear pain, facial swelling, hearing loss, mouth sores, nosebleeds, postnasal drip, rhinorrhea, sinus pressure, sinus pain, sneezing, sore throat, tinnitus, trouble swallowing and voice change.   Respiratory: Negative.   Cardiovascular: Negative.   Gastrointestinal: Negative.   Musculoskeletal: Negative.   Neurological: Negative.   Psychiatric/Behavioral: Negative.     Per HPI unless specifically indicated above     Objective:    There were no vitals taken for this visit.  Wt Readings from Last 3 Encounters:  10/30/19 130 lb (59 kg)  03/27/19 126 lb (57.2 kg)  01/10/19 112 lb 8 oz (51 kg)    Physical Exam Vitals and nursing note reviewed.  Pulmonary:     Effort: Pulmonary effort is normal. No respiratory distress.     Comments: Speaking in full sentences Neurological:     Mental Status: She is alert.  Psychiatric:        Mood and Affect: Mood normal.        Behavior: Behavior normal.        Thought Content: Thought  content normal.        Judgment: Judgment normal.     Results for orders placed or performed in visit on 03/27/19  SAR CoV2 Serology (COVID 19)AB(IGG)IA  Result Value Ref Range   SARS-CoV-2 Ab, IgG Negative Negative  Microscopic Examination   BLD  Result Value Ref Range   WBC, UA 6-10 (A) 0 - 5 /hpf   RBC 3-10 (A) 0 - 2 /hpf   Epithelial Cells (non renal) 0-10 0 - 10 /hpf   Bacteria, UA Few (A) None seen/Few  Urine Culture, Reflex   BLD  Result Value Ref Range   Urine Culture, Routine Final report    Organism ID, Bacteria Comment   Bayer DCA Hb A1c Waived  Result Value Ref Range   HB A1C (BAYER DCA - WAIVED) 6.1 <7.0 %  Comprehensive metabolic panel  Result Value Ref Range   Glucose 101 (H) 65 - 99 mg/dL   BUN 17 8 - 27 mg/dL   Creatinine, Ser 7.98 0.57 - 1.00 mg/dL   GFR calc non Af Amer 71 >59 mL/min/1.73   GFR calc Af Amer 82 >59 mL/min/1.73   BUN/Creatinine Ratio 20 12 - 28   Sodium 141 134 - 144 mmol/L   Potassium 4.4 3.5 - 5.2 mmol/L   Chloride 101 96 - 106 mmol/L   CO2 27 20 - 29 mmol/L  Calcium 9.8 8.7 - 10.3 mg/dL   Total Protein 6.8 6.0 - 8.5 g/dL   Albumin 4.1 3.8 - 4.8 g/dL   Globulin, Total 2.7 1.5 - 4.5 g/dL   Albumin/Globulin Ratio 1.5 1.2 - 2.2   Bilirubin Total 0.4 0.0 - 1.2 mg/dL   Alkaline Phosphatase 115 39 - 117 IU/L   AST 45 (H) 0 - 40 IU/L   ALT 38 (H) 0 - 32 IU/L  Lipid Panel w/o Chol/HDL Ratio  Result Value Ref Range   Cholesterol, Total 150 100 - 199 mg/dL   Triglycerides 67 0 - 149 mg/dL   HDL 87 >39 mg/dL   VLDL Cholesterol Cal 13 5 - 40 mg/dL   LDL Calculated 50 0 - 99 mg/dL  Microalbumin, Urine Waived  Result Value Ref Range   Microalb, Ur Waived 80 (H) 0 - 19 mg/L   Creatinine, Urine Waived 50 10 - 300 mg/dL   Microalb/Creat Ratio 30-300 (H) <30 mg/g  TSH  Result Value Ref Range   TSH 2.300 0.450 - 4.500 uIU/mL  UA/M w/rflx Culture, Routine   Specimen: Blood   BLD  Result Value Ref Range   Specific Gravity, UA 1.025 1.005  - 1.030   pH, UA 6.0 5.0 - 7.5   Color, UA Yellow Yellow   Appearance Ur Clear Clear   Leukocytes,UA 1+ (A) Negative   Protein,UA Negative Negative/Trace   Glucose, UA Negative Negative   Ketones, UA Negative Negative   RBC, UA 2+ (A) Negative   Bilirubin, UA Negative Negative   Urobilinogen, Ur 2.0 (H) 0.2 - 1.0 mg/dL   Nitrite, UA Negative Negative   Microscopic Examination See below:    Urinalysis Reflex Comment   CBC with Differential/Platelet  Result Value Ref Range   WBC 8.0 3.4 - 10.8 x10E3/uL   RBC 5.13 3.77 - 5.28 x10E6/uL   Hemoglobin 15.4 11.1 - 15.9 g/dL   Hematocrit 45.5 34.0 - 46.6 %   MCV 89 79 - 97 fL   MCH 30.0 26.6 - 33.0 pg   MCHC 33.8 31.5 - 35.7 g/dL   RDW 14.4 11.7 - 15.4 %   Platelets 159 150 - 450 x10E3/uL   Neutrophils 54 Not Estab. %   Lymphs 33 Not Estab. %   Monocytes 10 Not Estab. %   Eos 2 Not Estab. %   Basos 1 Not Estab. %   Neutrophils Absolute 4.4 1.4 - 7.0 x10E3/uL   Lymphocytes Absolute 2.6 0.7 - 3.1 x10E3/uL   Monocytes Absolute 0.8 0.1 - 0.9 x10E3/uL   EOS (ABSOLUTE) 0.2 0.0 - 0.4 x10E3/uL   Basophils Absolute 0.1 0.0 - 0.2 x10E3/uL   Immature Granulocytes 0 Not Estab. %   Immature Grans (Abs) 0.0 0.0 - 0.1 x10E3/uL      Assessment & Plan:   Problem List Items Addressed This Visit    None    Visit Diagnoses    Dental infection    -  Primary   Will get in with her dentist. Will treat with augmentin. Call with any concerns or if not getting better.        Follow up plan: Return ASAP in office follow up.    . This visit was completed via telephone due to the restrictions of the COVID-19 pandemic. All issues as above were discussed and addressed but no physical exam was performed. If it was felt that the patient should be evaluated in the office, they were directed there. The patient verbally consented to  this visit. Patient was unable to complete an audio/visual visit due to Lack of equipment. Due to the catastrophic nature of  the COVID-19 pandemic, this visit was done through audio contact only. . Location of the patient: home . Location of the provider: work . Those involved with this call:  . Provider: Olevia Perches, DO . CMA: Floydene Flock, RMA . Front Desk/Registration: Adela Ports  . Time spent on call: 15 minutes on the phone discussing health concerns. 23 minutes total spent in review of patient's record and preparation of their chart.

## 2020-03-15 ENCOUNTER — Ambulatory Visit: Payer: Medicare Other | Admitting: Family Medicine

## 2020-04-26 ENCOUNTER — Emergency Department: Payer: Medicare Other

## 2020-04-26 ENCOUNTER — Emergency Department
Admission: EM | Admit: 2020-04-26 | Discharge: 2020-04-26 | Disposition: A | Discharge status: Home / Self Care | Payer: Medicare Other | Attending: Emergency Medicine | Admitting: Emergency Medicine

## 2020-04-26 ENCOUNTER — Encounter: Payer: Self-pay | Admitting: Emergency Medicine

## 2020-04-26 ENCOUNTER — Other Ambulatory Visit: Payer: Self-pay

## 2020-04-26 DIAGNOSIS — S42294A Other nondisplaced fracture of upper end of right humerus, initial encounter for closed fracture: Secondary | ICD-10-CM | POA: Diagnosis not present

## 2020-04-26 DIAGNOSIS — Y92018 Other place in single-family (private) house as the place of occurrence of the external cause: Secondary | ICD-10-CM | POA: Diagnosis not present

## 2020-04-26 DIAGNOSIS — W010XXA Fall on same level from slipping, tripping and stumbling without subsequent striking against object, initial encounter: Secondary | ICD-10-CM | POA: Diagnosis not present

## 2020-04-26 DIAGNOSIS — J449 Chronic obstructive pulmonary disease, unspecified: Secondary | ICD-10-CM | POA: Insufficient documentation

## 2020-04-26 DIAGNOSIS — S42291A Other displaced fracture of upper end of right humerus, initial encounter for closed fracture: Secondary | ICD-10-CM

## 2020-04-26 DIAGNOSIS — Y999 Unspecified external cause status: Secondary | ICD-10-CM | POA: Diagnosis not present

## 2020-04-26 DIAGNOSIS — Y9301 Activity, walking, marching and hiking: Secondary | ICD-10-CM | POA: Insufficient documentation

## 2020-04-26 DIAGNOSIS — S4991XA Unspecified injury of right shoulder and upper arm, initial encounter: Secondary | ICD-10-CM | POA: Diagnosis present

## 2020-04-26 DIAGNOSIS — F17210 Nicotine dependence, cigarettes, uncomplicated: Secondary | ICD-10-CM | POA: Diagnosis not present

## 2020-04-26 DIAGNOSIS — S42201A Unspecified fracture of upper end of right humerus, initial encounter for closed fracture: Secondary | ICD-10-CM | POA: Diagnosis not present

## 2020-04-26 DIAGNOSIS — S42211A Unspecified displaced fracture of surgical neck of right humerus, initial encounter for closed fracture: Secondary | ICD-10-CM | POA: Diagnosis not present

## 2020-04-26 DIAGNOSIS — E119 Type 2 diabetes mellitus without complications: Secondary | ICD-10-CM | POA: Insufficient documentation

## 2020-04-26 MED ORDER — IBUPROFEN 600 MG PO TABS: 600.0000 mg | ORAL_TABLET | Freq: Three times a day (TID) | ORAL | 0 refills | Status: DC | PRN

## 2020-04-26 MED ORDER — OXYCODONE-ACETAMINOPHEN 5-325 MG PO TABS
1.0000 | ORAL_TABLET | ORAL | Status: DC | PRN
  Administered 2020-04-26: 1 via ORAL
  Filled 2020-04-26: qty 1

## 2020-04-26 MED ORDER — OXYCODONE-ACETAMINOPHEN 7.5-325 MG PO TABS: 1.0000 | ORAL_TABLET | Freq: Four times a day (QID) | ORAL | 0 refills | Status: DC | PRN

## 2020-04-26 MED ORDER — HYDROMORPHONE HCL 1 MG/ML IJ SOLN
0.5000 mg | Freq: Once | INTRAMUSCULAR | Status: AC
  Administered 2020-04-26: 0.5 mg via INTRAMUSCULAR
  Filled 2020-04-26: qty 1

## 2020-04-26 NOTE — ED Provider Notes (Signed)
[facility] Emergency Department Provider Note   ____________________________________________   First MD Initiated Contact with Patient 04/26/20 0820     (approximate)  I have reviewed the triage vital signs and the nursing notes.   HISTORY  Chief Complaint Shoulder Injury    HPI Kim Graham is a 69 y.o. female patient complaining right upper shoulder pain secondary to a trip and fall yesterday.  Patient she tripped over a rug and fell landed on her right shoulder.  Patient states awakening this morning with increased pain and decreased range of motion.  Patient denies loss of sensation.  Patient is right-hand dominant.  Patient rates pain as a 10/10.  Patient scribed pain is "achy".  No palliative measure for complaint.         Past Medical History:  Diagnosis Date  . COPD (chronic obstructive pulmonary disease) [facility])     Patient Active Problem List   Diagnosis Date Noted  . Diabetes mellitus type 2, diet-controlled ([place]) 04/15/2018  . Complicated grief 70/35/0093  . Advance directive discussed with patient 06/18/2017  . Chronic obstructive pulmonary disease ([place]) 06/16/2017  . Tobacco abuse 06/16/2017  . Chronic nasal congestion 06/16/2017    History reviewed. No pertinent surgical history.  Prior to Admission medications   Medication Sig Start Date End Date Taking? Authorizing Provider  albuterol (VENTOLIN HFA) 108 (90 Base) MCG/ACT inhaler Inhale 2 puffs into the lungs every 6 (six) hours as needed for wheezing or shortness of breath. 02/27/20   Johnson, Megan P, DO  amoxicillin-clavulanate (AUGMENTIN) 875-125 MG tablet Take 1 tablet by mouth 2 (two) times daily. 02/27/20   Johnson, Megan P, DO  ANORO ELLIPTA 62.5-25 MCG/INH AEPB Inhale 1 puff into the lungs daily. 03/27/19   Johnson, Megan P, DO  aripiprazole (ABILIFY) 15 MG disintegrating tablet Take 1 tablet (15 mg total) by mouth daily. 06/07/19   Volney American, PA-C  conjugated  estrogens (PREMARIN) vaginal cream Place 1 Applicatorful vaginally daily as needed. 06/07/19   Volney American, PA-C  ibuprofen (ADVIL) 600 MG tablet Take 1 tablet (600 mg total) by mouth every 8 (eight) hours as needed. 04/26/20   Sable Feil, PA-C  METHADONE HCL PO Take 83 mg by mouth daily.    [provider]  oxyCODONE-acetaminophen (PERCOCET) 7.5-325 MG tablet Take 1 tablet by mouth every 6 (six) hours as needed. 04/26/20   Sable Feil, PA-C    Allergies Patient has no known allergies.  Family History  Problem Relation Age of Onset  . Diabetes Mother   . Cancer Father        Pancreatic  . Diabetes Maternal Aunt   . Diabetes Maternal Grandmother   . Dementia Maternal Grandfather   . Heart disease Maternal Grandfather     Social History Social History   Tobacco Use  . Smoking status: Current Every Day Smoker    Packs/day: 0.75    Years: 49.00    Pack years: 36.75    Last attempt to quit: 12/13/2018    Years since quitting: 1.3  . Smokeless tobacco: Never Used  Vaping Use  . Vaping Use: Never used  Substance Use Topics  . Alcohol use: Yes    Comment: On occasion  . Drug use: No    Review of Systems Constitutional: No fever/chills Eyes: No visual changes. ENT: No sore throat. Cardiovascular: Denies chest pain. Respiratory: Denies shortness of breath. Gastrointestinal: No abdominal pain.  No nausea, no vomiting.  No  diarrhea.  No constipation. Genitourinary: Negative for dysuria. Musculoskeletal: Right shoulder pain. Skin: Negative for rash. Neurological: Negative for headaches, focal weakness or numbness. Endocrine: Diabetes  ____________________________________________   PHYSICAL EXAM:  VITAL SIGNS: ED Triage Vitals  Enc Vitals Group     BP 04/26/20 0730 (!) 156/83     Pulse Rate 04/26/20 0730 100     Resp 04/26/20 0730 20     Temp 04/26/20 0730 98.6 F (37 C)     Temp Source 04/26/20 0730 Oral     SpO2 04/26/20 0730 98 %      Weight 04/26/20 0719 130 lb (59 kg)     Height 04/26/20 0719 5\' 4"  (1.626 m)     Head Circumference --      Peak Flow --      Pain Score 04/26/20 0719 10     Pain Loc --      Pain Edu? --      Excl. in GC? --     Constitutional: Alert and oriented. Well appearing and in no acute distress. Cardiovascular: Normal rate, regular rhythm. Grossly normal heart sounds.  Good peripheral circulation. Respiratory: Normal respiratory effort.  No retractions. Lungs CTAB. Gastrointestinal: Soft and nontender. No distention. No abdominal bruits. No CVA tenderness. Genitourinary: Deferred Musculoskeletal: No obvious deformity to the right shoulder.  Patient is moderate guarding palpation of the right humeral head.  Decreased range of motion is all fields.   Neurologic:  Normal speech and language. No gross focal neurologic deficits are appreciated. No gait instability. Skin:  Skin is warm, dry and intact. No rash noted. Psychiatric: Mood and affect are normal. Speech and behavior are normal.  ____________________________________________   LABS (all labs ordered are listed, but only abnormal results are displayed)  Labs Reviewed - No data to display ____________________________________________  EKG   ____________________________________________  RADIOLOGY  ED MD interpretation:    Official radiology report(s): No results found.  ____________________________________________   PROCEDURES  Procedure(s) performed (including Critical Care):  Procedures   ____________________________________________   INITIAL IMPRESSION / ASSESSMENT AND PLAN / ED COURSE  As part of my medical decision making, I reviewed the following data within the electronic MEDICAL RECORD NUMBER    Patient presents with right shoulder pain secondary to a trip and fall yesterday.  Denies LOC or head injury.  Discussed x-ray findings with patient consistent with humeral head fracture.  Patient placed in arm sling and  given discharge care instruction.  Patient will follow orthopedic by calling today to schedule appointment.       Kim Graham was evaluated in Emergency Department on 04/26/2020 for the symptoms described in the history of present illness. She was evaluated in the context of the global COVID-19 pandemic, which necessitated consideration that the patient might be at risk for infection with the SARS-CoV-2 virus that causes COVID-19. Institutional protocols and algorithms that pertain to the evaluation of patients at risk for COVID-19 are in a state of rapid change based on information released by regulatory bodies including the CDC and federal and state organizations. These policies and algorithms were followed during the patient's care in the ED.       ____________________________________________   FINAL CLINICAL IMPRESSION(S) / ED DIAGNOSES  Final diagnoses:  Humeral head fracture, right, closed, initial encounter     ED Discharge Orders         Ordered    oxyCODONE-acetaminophen (PERCOCET) 7.5-325 MG tablet  Every 6 hours PRN     Discontinue  Reprint     04/26/20 0826    ibuprofen (ADVIL) 600 MG tablet  Every 8 hours PRN     Discontinue  Reprint     04/26/20 8206           Note:  This document was prepared using Dragon voice recognition software and may include unintentional dictation errors.    Joni Reining, PA-C 04/26/20 0156    Chesley Noon, MD 04/27/20 [phone]

## 2020-04-26 NOTE — ED Notes (Signed)
See triage note  Presents s/p trip and fall   States she is having pain to right shoulder  Questionable deformity

## 2020-04-26 NOTE — ED Triage Notes (Signed)
Pt reports she tripped over an area rug yesterday and fell and now her right shoulder hurts bad. Pt states cannot lift.

## 2020-04-26 NOTE — Discharge Instructions (Addendum)
Wear sling and take medication as directed.  Follow discharge care instruction.  Call orthopedics listed on your discharge care instruction today to schedule appointment.  Tell them you are a follow-up from the emergency room.

## 2020-05-06 DIAGNOSIS — S42211A Unspecified displaced fracture of surgical neck of right humerus, initial encounter for closed fracture: Secondary | ICD-10-CM | POA: Diagnosis not present

## 2020-05-07 DIAGNOSIS — S42211D Unspecified displaced fracture of surgical neck of right humerus, subsequent encounter for fracture with routine healing: Secondary | ICD-10-CM | POA: Diagnosis not present

## 2020-05-07 DIAGNOSIS — E119 Type 2 diabetes mellitus without complications: Secondary | ICD-10-CM | POA: Diagnosis not present

## 2020-05-07 DIAGNOSIS — J3489 Other specified disorders of nose and nasal sinuses: Secondary | ICD-10-CM | POA: Diagnosis not present

## 2020-05-07 DIAGNOSIS — Z79899 Other long term (current) drug therapy: Secondary | ICD-10-CM | POA: Diagnosis not present

## 2020-05-07 DIAGNOSIS — Z9181 History of falling: Secondary | ICD-10-CM | POA: Diagnosis not present

## 2020-05-07 DIAGNOSIS — J449 Chronic obstructive pulmonary disease, unspecified: Secondary | ICD-10-CM | POA: Diagnosis not present

## 2020-05-17 ENCOUNTER — Ambulatory Visit (INDEPENDENT_AMBULATORY_CARE_PROVIDER_SITE_OTHER): Payer: Medicare Other

## 2020-05-17 VITALS — Ht 64.0 in | Wt 130.0 lb

## 2020-05-17 DIAGNOSIS — Z0000 Encounter for general adult medical examination without abnormal findings: Secondary | ICD-10-CM | POA: Diagnosis not present

## 2020-05-17 NOTE — Progress Notes (Addendum)
I connected with Kim Graham today by telephone and verified that I am speaking with the correct person using two identifiers. Location patient: home Location provider: work Persons participating in the virtual visit: Kim Graham, Kim Graham.   I discussed the limitations, risks, security and privacy concerns of performing an evaluation and management service by telephone and the availability of in person appointments. I also discussed with the patient that there may be a patient responsible charge related to this service. The patient expressed understanding and verbally consented to this telephonic visit.    Interactive audio and video telecommunications were attempted between this provider and patient, however failed, due to patient having technical difficulties OR patient did not have access to video capability.  We continued and completed visit with audio only.    Vital signs may be patient reported or missing.   Subjective:   Kim Graham is a 69 y.o. female who presents for Medicare Annual (Subsequent) preventive examination.  Review of Systems     Cardiac Risk Factors include: advanced age (>56men, >7 women);smoking/ tobacco exposure;sedentary lifestyle     Objective:    Today's Vitals   05/17/20 1112 05/17/20 1114  Weight: 130 lb (59 kg)   Height: 5\' 4"  (1.626 m)   PainSc:  7    Body mass index is 22.31 kg/m.  Advanced Directives 05/17/2020 04/26/2020  Does Patient Have a Medical Advance Directive? No No  Would patient like information on creating a medical advance directive? No - Patient declined No - Patient declined    Current Medications (verified) Outpatient Encounter Medications as of 05/17/2020  Medication Sig  . albuterol (VENTOLIN HFA) 108 (90 Base) MCG/ACT inhaler Inhale 2 puffs into the lungs every 6 (six) hours as needed for wheezing or shortness of breath.  07/18/2020 ELLIPTA 62.5-25 MCG/INH AEPB Inhale 1 puff into the lungs daily.  12-[birth year]1985 conjugated  estrogens (PREMARIN) vaginal cream Place 1 Applicatorful vaginally daily as needed.  Marland Kitchen ibuprofen (ADVIL) 600 MG tablet Take 1 tablet (600 mg total) by mouth every 8 (eight) hours as needed.  . METHADONE HCL PO Take 83 mg by mouth daily.  Marland Kitchen amoxicillin-clavulanate (AUGMENTIN) 875-125 MG tablet Take 1 tablet by mouth 2 (two) times daily. (Patient not taking: Reported on 05/17/2020)  . aripiprazole (ABILIFY) 15 MG disintegrating tablet Take 1 tablet (15 mg total) by mouth daily. (Patient not taking: Reported on 05/17/2020)  . oxyCODONE-acetaminophen (PERCOCET) 7.5-325 MG tablet Take 1 tablet by mouth every 6 (six) hours as needed. (Patient not taking: Reported on 05/17/2020)   No facility-administered encounter medications on file as of 05/17/2020.    Allergies (verified) Patient has no known allergies.   History: Past Medical History:  Diagnosis Date  . COPD (chronic obstructive pulmonary disease) (HCC)    History reviewed. No pertinent surgical history. Family History  Problem Relation Age of Onset  . Diabetes Mother   . Cancer Father        Pancreatic  . Diabetes Maternal Aunt   . Diabetes Maternal Grandmother   . Dementia Maternal Grandfather   . Heart disease Maternal Grandfather    Social History   Socioeconomic History  . Marital status: Single    Spouse name: Not on file  . Number of children: Not on file  . Years of education: Not on file  . Highest education level: Not on file  Occupational History  . Not on file  Tobacco Use  . Smoking status: Current Every Day Smoker  Packs/day: 0.75    Years: 49.00    Pack years: 36.75    Last attempt to quit: 12/13/2018    Years since quitting: 1.4  . Smokeless tobacco: Never Used  Vaping Use  . Vaping Use: Never used  Substance and Sexual Activity  . Alcohol use: Not Currently    Comment: On occasion  . Drug use: No  . Sexual activity: Not Currently  Other Topics Concern  . Not on file  Social History Narrative  . Not on  file   Social Determinants of Health   Financial Resource Strain: Medium Risk  . Difficulty of Paying Living Expenses: Somewhat hard  Food Insecurity: No Food Insecurity  . Worried About Programme researcher, broadcasting/film/video in the Last Year: Never true  . Ran Out of Food in the Last Year: Never true  Transportation Needs: No Transportation Needs  . Lack of Transportation (Medical): No  . Lack of Transportation (Non-Medical): No  Physical Activity: Inactive  . Days of Exercise per Week: 0 days  . Minutes of Exercise per Session: 0 min  Stress: No Stress Concern Present  . Feeling of Stress : Not at all  Social Connections:   . Frequency of Communication with Friends and Family:   . Frequency of Social Gatherings with Friends and Family:   . Attends Religious Services:   . Active Member of Clubs or Organizations:   . Attends Banker Meetings:   Marland Kitchen Marital Status:     Tobacco Counseling Ready to quit: Not Answered Counseling given: Not Answered   Clinical Intake:  Pre-visit preparation completed: Yes  Pain : 0-10 Pain Score: 7  Pain Type: Acute pain Pain Location: Shoulder Pain Orientation: Right Pain Radiating Towards: elbow Pain Descriptors / Indicators: Dull Pain Onset: 1 to 4 weeks ago Pain Frequency: Constant     Nutritional Status: BMI of 19-24  Normal Nutritional Risks: None Diabetes: No  How often do you need to have someone help you when you read instructions, pamphlets, or other written materials from your doctor or pharmacy?: 1 - Never What is the last grade level you completed in school?: 2 yrs college  Diabetic?no   Interpreter Needed?: No  Information entered by :: NAllen Graham   Activities of Daily Living In your present state of health, do you have any difficulty performing the following activities: 05/17/2020  Hearing? N  Vision? N  Difficulty concentrating or making decisions? N  Walking or climbing stairs? N  Dressing or bathing? N  Doing  errands, shopping? N  Preparing Food and eating ? N  Using the Toilet? N  In the past six months, have you accidently leaked urine? N  Do you have problems with loss of bowel control? N  Managing your Medications? N  Managing your Finances? N  Housekeeping or managing your Housekeeping? N  Some recent data might be hidden    Patient Care Team: Dorcas Carrow, DO as PCP - General (Family Medicine)  Indicate any recent Medical Services you may have received from other than [facility] providers in the past year (date may be approximate).     Assessment:   This is a routine wellness examination for Kim Graham.  Hearing/Vision screen  Hearing Screening   125Hz  250Hz  500Hz  1000Hz  2000Hz  3000Hz  4000Hz  6000Hz  8000Hz   Right ear:           Left ear:           Vision Screening Comments: Regular eye exams, Dr.  Dietary issues and exercise activities discussed: Current Exercise Habits: The patient does not participate in regular exercise at present  Goals    . Patient Stated     05/17/2020, to get over broken shoulder      Depression Screen PHQ 2/9 Scores 05/17/2020 06/07/2019 03/27/2019 09/19/2018 05/20/2018 03/18/2018 03/18/2018  PHQ - 2 Score 0 0 0 0 0 1 1  PHQ- 9 Score - 8 3 1 3 4 4     Fall Risk Fall Risk  05/17/2020 06/07/2019 03/27/2019 09/19/2018 03/18/2018  Falls in the past year? 1 0 0 0 No  Comment tripped and fell on to coffee table - - - -  Number falls in past yr: 0 - 0 - -  Injury with Fall? 1 - 0 - -  Risk for fall due to : History of fall(s);Medication side effect - - - -  Follow up Education provided;Falls prevention discussed;Falls evaluation completed Falls evaluation completed - - -    Any stairs in or around the home? Yes  If so, are there any without handrails? Yes  Home free of loose throw rugs in walkways, pet beds, electrical cords, etc? Yes  Adequate lighting in your home to reduce risk of falls? Yes   ASSISTIVE DEVICES UTILIZED TO PREVENT FALLS:  Life alert?  No  Use of a cane, walker or w/c? No  Grab bars in the bathroom? No  Shower chair or bench in shower? No  Elevated toilet seat or a handicapped toilet? No   TIMED UP AND GO:  Was the test performed? No .  Cognitive Function:     6CIT Screen 05/17/2020 09/19/2018 06/18/2017  What Year? 0 points 0 points 0 points  What month? 0 points 0 points 0 points  What time? 0 points 0 points 0 points  Count back from 20 0 points 0 points 0 points  Months in reverse 0 points 0 points 0 points  Repeat phrase 2 points 2 points 0 points  Total Score 2 2 0    Immunizations Immunization History  Administered Date(s) Administered  . Fluad Quad(high Dose 65+) 10/02/2019  . Influenza, High Dose Seasonal PF 08/27/2017, 09/19/2018  . PFIZER SARS-COV-2 Vaccination 01/11/2020, 02/01/2020  . Pneumococcal Conjugate-13 06/16/2017  . Pneumococcal Polysaccharide-23 09/19/2018    TDAP status: Due, Education has been provided regarding the importance of this vaccine. Advised may receive this vaccine at local pharmacy or Health Dept. Aware to provide a copy of the vaccination record if obtained from local pharmacy or Health Dept. Verbalized acceptance and understanding. Flu Vaccine status: Up to date Pneumococcal vaccine status: Up to date Covid-19 vaccine status: Completed vaccines  Qualifies for Shingles Vaccine? Yes   Zostavax completed No   Shingrix Completed?: No.    Education has been provided regarding the importance of this vaccine. Patient has been advised to call insurance company to determine out of pocket expense if they have not yet received this vaccine. Advised may also receive vaccine at local pharmacy or Health Dept. Verbalized acceptance and understanding.  Screening Tests Health Maintenance  Topic Date Due  . OPHTHALMOLOGY EXAM  Never done  . TETANUS/TDAP  Never done  . MAMMOGRAM  Never done  . DEXA SCAN  Never done  . FOOT EXAM  04/16/2019  . HEMOGLOBIN A1C  09/27/2019  . URINE  MICROALBUMIN  03/26/2020  . COLONOSCOPY  06/06/2020 (Originally 12/21/2001)  . INFLUENZA VACCINE  06/09/2020  . COVID-19 Vaccine  Completed  . Hepatitis C Screening  Completed  .  PNA vac Low Risk Adult  Completed    Health Maintenance  Health Maintenance Due  Topic Date Due  . OPHTHALMOLOGY EXAM  Never done  . TETANUS/TDAP  Never done  . MAMMOGRAM  Never done  . DEXA SCAN  Never done  . FOOT EXAM  04/16/2019  . HEMOGLOBIN A1C  09/27/2019  . URINE MICROALBUMIN  03/26/2020    Colorectal cancer screening: Due Mammogram status: Due Bone Density status: Due  Lung Cancer Screening: (Low Dose CT Chest recommended if Age 14-80 years, 30 pack-year currently smoking OR have quit w/in 15years.) does qualify.   Lung Cancer Screening Referral: not interested  Additional Screening:  Hepatitis C Screening: does qualify; Completed 04/09/2016  Vision Screening: Recommended annual ophthalmology exams for early detection of glaucoma and other disorders of the eye. Is the patient up to date with their annual eye exam?  Yes  Who is the provider or what is the name of the office in which the patient attends annual eye exams? Dr. Sibyl Parr If pt is not established with a provider, would they like to be referred to a provider to establish care? No .   Dental Screening: Recommended annual dental exams for proper oral hygiene  Community Resource Referral / Chronic Care Management: CRR required this visit?  Yes   CCM required this visit?  No      Plan:     I have personally reviewed and noted the following in the patient's chart:   . Medical and social history . Use of alcohol, tobacco or illicit drugs  . Current medications and supplements . Functional ability and status . Nutritional status . Physical activity . Advanced directives . List of other physicians . Hospitalizations, surgeries, and ER visits in previous 12 months . Vitals . Screenings to include cognitive, depression, and  falls . Referrals and appointments  In addition, I have reviewed and discussed with patient certain preventive protocols, quality metrics, and best practice recommendations. A written personalized care plan for preventive services as well as general preventive health recommendations were provided to patient.   Due to this being a telephonic visit, the after visit summary with patients personalized plan was offered to patient via mail Patient preferred to pick up at office at next visit.   Barb Merino, Graham   [id number]/[id number]   Nurse Notes: Patient is due for mammogram and bone density. She has declined colonoscopy in the past. Says she is not so keen on getting one. Qualifies for CT scan due to smoking, but is not interested in getting one until she heals from her broken shoulder.

## 2020-05-17 NOTE — Patient Instructions (Signed)
Kim Graham , Thank you for taking time to come for your Medicare Wellness Visit. I appreciate your ongoing commitment to your health goals. Please review the following plan we discussed and let me know if I can assist you in the future.   Screening recommendations/referrals: Colonoscopy: declines Mammogram: due Bone Density: due Recommended yearly ophthalmology/optometry visit for glaucoma screening and checkup Recommended yearly dental visit for hygiene and checkup  Vaccinations: Influenza vaccine: completed 10/02/2019, due 06/09/2020 Pneumococcal vaccine: completed 09/19/2018 Tdap vaccine: due Shingles vaccine: discussed   Covid-19:3/25/201, 01/11/2020  Advanced directives: Advance directive discussed with you today. Even though you declined this today please call our office should you change your mind and we can give you the proper paperwork for you to fill out.  Conditions/risks identified: smoking  Next appointment: Follow up in one year for your annual wellness visit    Preventive Care 65 Years and Older, Female Preventive care refers to lifestyle choices and visits with your health care provider that can promote health and wellness. What does preventive care include?  A yearly physical exam. This is also called an annual well check.  Dental exams once or twice a year.  Routine eye exams. Ask your health care provider how often you should have your eyes checked.  Personal lifestyle choices, including:  Daily care of your teeth and gums.  Regular physical activity.  Eating a healthy diet.  Avoiding tobacco and drug use.  Limiting alcohol use.  Practicing safe sex.  Taking low-dose aspirin every day.  Taking vitamin and mineral supplements as recommended by your health care provider. What happens during an annual well check? The services and screenings done by your health care provider during your annual well check will depend on your age, overall health, lifestyle  risk factors, and family history of disease. Counseling  Your health care provider may ask you questions about your:  Alcohol use.  Tobacco use.  Drug use.  Emotional well-being.  Home and relationship well-being.  Sexual activity.  Eating habits.  History of falls.  Memory and ability to understand (cognition).  Work and work Astronomer.  Reproductive health. Screening  You may have the following tests or measurements:  Height, weight, and BMI.  Blood pressure.  Lipid and cholesterol levels. These may be checked every 5 years, or more frequently if you are over 23 years old.  Skin check.  Lung cancer screening. You may have this screening every year starting at age 36 if you have a 30-pack-year history of smoking and currently smoke or have quit within the past 15 years.  Fecal occult blood test (FOBT) of the stool. You may have this test every year starting at age 49.  Flexible sigmoidoscopy or colonoscopy. You may have a sigmoidoscopy every 5 years or a colonoscopy every 10 years starting at age 90.  Hepatitis C blood test.  Hepatitis B blood test.  Sexually transmitted disease (STD) testing.  Diabetes screening. This is done by checking your blood sugar (glucose) after you have not eaten for a while (fasting). You may have this done every 1-3 years.  Bone density scan. This is done to screen for osteoporosis. You may have this done starting at age 27.  Mammogram. This may be done every 1-2 years. Talk to your health care provider about how often you should have regular mammograms. Talk with your health care provider about your test results, treatment options, and if necessary, the need for more tests. Vaccines  Your health care provider  may recommend certain vaccines, such as:  Influenza vaccine. This is recommended every year.  Tetanus, diphtheria, and acellular pertussis (Tdap, Td) vaccine. You may need a Td booster every 10 years.  Zoster vaccine.  You may need this after age 18.  Pneumococcal 13-valent conjugate (PCV13) vaccine. One dose is recommended after age 23.  Pneumococcal polysaccharide (PPSV23) vaccine. One dose is recommended after age 73. Talk to your health care provider about which screenings and vaccines you need and how often you need them. This information is not intended to replace advice given to you by your health care provider. Make sure you discuss any questions you have with your health care provider. Document Released: 11/22/2015 Document Revised: 07/15/2016 Document Reviewed: 08/27/2015 Elsevier Interactive Patient Education  2017 [employer] Prevention in the Home Falls can cause injuries. They can happen to people of all ages. There are many things you can do to make your home safe and to help prevent falls. What can I do on the outside of my home?  Regularly fix the edges of walkways and driveways and fix any cracks.  Remove anything that might make you trip as you walk through a door, such as a raised step or threshold.  Trim any bushes or trees on the path to your home.  Use bright outdoor lighting.  Clear any walking paths of anything that might make someone trip, such as rocks or tools.  Regularly check to see if handrails are loose or broken. Make sure that both sides of any steps have handrails.  Any raised decks and porches should have guardrails on the edges.  Have any leaves, snow, or ice cleared regularly.  Use sand or salt on walking paths during winter.  Clean up any spills in your garage right away. This includes oil or grease spills. What can I do in the bathroom?  Use night lights.  Install grab bars by the toilet and in the tub and shower. Do not use towel bars as grab bars.  Use non-skid mats or decals in the tub or shower.  If you need to sit down in the shower, use a plastic, non-slip stool.  Keep the floor dry. Clean up any water that spills on the floor as soon  as it happens.  Remove soap buildup in the tub or shower regularly.  Attach bath mats securely with double-sided non-slip rug tape.  Do not have throw rugs and other things on the floor that can make you trip. What can I do in the bedroom?  Use night lights.  Make sure that you have a light by your bed that is easy to reach.  Do not use any sheets or blankets that are too big for your bed. They should not hang down onto the floor.  Have a firm chair that has side arms. You can use this for support while you get dressed.  Do not have throw rugs and other things on the floor that can make you trip. What can I do in the kitchen?  Clean up any spills right away.  Avoid walking on wet floors.  Keep items that you use a lot in easy-to-reach places.  If you need to reach something above you, use a strong step stool that has a grab bar.  Keep electrical cords out of the way.  Do not use floor polish or wax that makes floors slippery. If you must use wax, use non-skid floor wax.  Do not have throw rugs  and other things on the floor that can make you trip. What can I do with my stairs?  Do not leave any items on the stairs.  Make sure that there are handrails on both sides of the stairs and use them. Fix handrails that are broken or loose. Make sure that handrails are as long as the stairways.  Check any carpeting to make sure that it is firmly attached to the stairs. Fix any carpet that is loose or worn.  Avoid having throw rugs at the top or bottom of the stairs. If you do have throw rugs, attach them to the floor with carpet tape.  Make sure that you have a light switch at the top of the stairs and the bottom of the stairs. If you do not have them, ask someone to add them for you. What else can I do to help prevent falls?  Wear shoes that:  Do not have high heels.  Have rubber bottoms.  Are comfortable and fit you well.  Are closed at the toe. Do not wear sandals.  If  you use a stepladder:  Make sure that it is fully opened. Do not climb a closed stepladder.  Make sure that both sides of the stepladder are locked into place.  Ask someone to hold it for you, if possible.  Clearly mark and make sure that you can see:  Any grab bars or handrails.  First and last steps.  Where the edge of each step is.  Use tools that help you move around (mobility aids) if they are needed. These include:  Canes.  Walkers.  Scooters.  Crutches.  Turn on the lights when you go into a dark area. Replace any light bulbs as soon as they burn out.  Set up your furniture so you have a clear path. Avoid moving your furniture around.  If any of your floors are uneven, fix them.  If there are any pets around you, be aware of where they are.  Review your medicines with your doctor. Some medicines can make you feel dizzy. This can increase your chance of falling. Ask your doctor what other things that you can do to help prevent falls. This information is not intended to replace advice given to you by your health care provider. Make sure you discuss any questions you have with your health care provider. Document Released: 08/22/2009 Document Revised: 04/02/2016 Document Reviewed: 11/30/2014 Elsevier Interactive Patient Education  2017 [employer].

## 2020-05-23 DIAGNOSIS — S42211A Unspecified displaced fracture of surgical neck of right humerus, initial encounter for closed fracture: Secondary | ICD-10-CM | POA: Diagnosis not present

## 2020-06-10 DIAGNOSIS — S42211D Unspecified displaced fracture of surgical neck of right humerus, subsequent encounter for fracture with routine healing: Secondary | ICD-10-CM | POA: Diagnosis not present

## 2020-06-17 DIAGNOSIS — S42211D Unspecified displaced fracture of surgical neck of right humerus, subsequent encounter for fracture with routine healing: Secondary | ICD-10-CM | POA: Diagnosis not present

## 2020-06-20 DIAGNOSIS — S42211A Unspecified displaced fracture of surgical neck of right humerus, initial encounter for closed fracture: Secondary | ICD-10-CM | POA: Diagnosis not present

## 2020-07-01 DIAGNOSIS — S42211D Unspecified displaced fracture of surgical neck of right humerus, subsequent encounter for fracture with routine healing: Secondary | ICD-10-CM | POA: Diagnosis not present

## 2020-07-02 ENCOUNTER — Encounter: Payer: Self-pay | Admitting: Family Medicine

## 2020-07-02 ENCOUNTER — Other Ambulatory Visit: Payer: Self-pay

## 2020-07-02 ENCOUNTER — Telehealth: Payer: Self-pay | Admitting: Family Medicine

## 2020-07-02 ENCOUNTER — Ambulatory Visit (INDEPENDENT_AMBULATORY_CARE_PROVIDER_SITE_OTHER): Payer: Medicare Other | Admitting: Family Medicine

## 2020-07-02 VITALS — BP 158/88 | HR 109 | Temp 99.6°F | Ht 65.75 in | Wt 125.8 lb

## 2020-07-02 DIAGNOSIS — J441 Chronic obstructive pulmonary disease with (acute) exacerbation: Secondary | ICD-10-CM

## 2020-07-02 DIAGNOSIS — Z111 Encounter for screening for respiratory tuberculosis: Secondary | ICD-10-CM | POA: Diagnosis not present

## 2020-07-02 DIAGNOSIS — S51802A Unspecified open wound of left forearm, initial encounter: Secondary | ICD-10-CM

## 2020-07-02 DIAGNOSIS — Z113 Encounter for screening for infections with a predominantly sexual mode of transmission: Secondary | ICD-10-CM

## 2020-07-02 DIAGNOSIS — Z1231 Encounter for screening mammogram for malignant neoplasm of breast: Secondary | ICD-10-CM

## 2020-07-02 DIAGNOSIS — E119 Type 2 diabetes mellitus without complications: Secondary | ICD-10-CM

## 2020-07-02 DIAGNOSIS — Z0000 Encounter for general adult medical examination without abnormal findings: Secondary | ICD-10-CM | POA: Diagnosis not present

## 2020-07-02 DIAGNOSIS — F1190 Opioid use, unspecified, uncomplicated: Secondary | ICD-10-CM | POA: Insufficient documentation

## 2020-07-02 DIAGNOSIS — Z23 Encounter for immunization: Secondary | ICD-10-CM | POA: Diagnosis not present

## 2020-07-02 DIAGNOSIS — Z13820 Encounter for screening for osteoporosis: Secondary | ICD-10-CM

## 2020-07-02 DIAGNOSIS — Z79899 Other long term (current) drug therapy: Secondary | ICD-10-CM | POA: Diagnosis not present

## 2020-07-02 DIAGNOSIS — F1120 Opioid dependence, uncomplicated: Secondary | ICD-10-CM

## 2020-07-02 DIAGNOSIS — F339 Major depressive disorder, recurrent, unspecified: Secondary | ICD-10-CM

## 2020-07-02 LAB — URINALYSIS, ROUTINE W REFLEX MICROSCOPIC
Bilirubin, UA: NEGATIVE
Glucose, UA: NEGATIVE
Ketones, UA: NEGATIVE
Nitrite, UA: NEGATIVE
Specific Gravity, UA: 1.025 (ref 1.005–1.030)
Urobilinogen, Ur: >=8 mg/dL (ref 0.2–1.0)
pH, UA: 6.5 (ref 5.0–7.5)

## 2020-07-02 LAB — MICROSCOPIC EXAMINATION: RBC, Urine: >30 /hpf — AB (ref 0–2)

## 2020-07-02 LAB — BAYER DCA HB A1C WAIVED: HB A1C (BAYER DCA - WAIVED): 5.1 % (ref <7.0)

## 2020-07-02 LAB — MICROALBUMIN, URINE WAIVED
Creatinine, Urine Waived: 100 mg/dL (ref 10–300)
Microalb, Ur Waived: 150 mg/L — ABNORMAL HIGH (ref 0–19)
Microalb/Creat Ratio: >300 mg/g — ABNORMAL HIGH (ref <30)

## 2020-07-02 MED ORDER — ARIPIPRAZOLE 15 MG PO TBDP: 15.0000 mg | ORAL_TABLET | Freq: Every day | ORAL | 1 refills | Status: DC

## 2020-07-02 MED ORDER — ARIPIPRAZOLE 15 MG PO TABS: 15.0000 mg | ORAL_TABLET | Freq: Every day | ORAL | 1 refills | Status: DC

## 2020-07-02 MED ORDER — ANORO ELLIPTA 62.5-25 MCG/INH IN AEPB: 1.0000 | INHALATION_SPRAY | Freq: Every day | RESPIRATORY_TRACT | 3 refills | Status: DC

## 2020-07-02 MED ORDER — ALBUTEROL SULFATE HFA 108 (90 BASE) MCG/ACT IN AERS: 2.0000 | INHALATION_SPRAY | Freq: Four times a day (QID) | RESPIRATORY_TRACT | 1 refills | Status: DC | PRN

## 2020-07-02 NOTE — Assessment & Plan Note (Signed)
Under good control on current regimen. Continue current regimen. Continue to monitor. Call with any concerns. Refills given. Labs drawn today.   

## 2020-07-02 NOTE — Patient Instructions (Signed)
Health Maintenance After Age 68 After age 68, you are at a higher risk for certain long-term diseases and infections as well as injuries from falls. Falls are a major cause of broken bones and head injuries in people who are older than age 68. Getting regular preventive care can help to keep you healthy and well. Preventive care includes getting regular testing and making lifestyle changes as recommended by your health care provider. Talk with your health care provider about:  Which screenings and tests you should have. A screening is a test that checks for a disease when you have no symptoms.  A diet and exercise plan that is right for you. What should I know about screenings and tests to prevent falls? Screening and testing are the best ways to find a health problem early. Early diagnosis and treatment give you the best chance of managing medical conditions that are common after age 68. Certain conditions and lifestyle choices may make you more likely to have a fall. Your health care provider may recommend:  Regular vision checks. Poor vision and conditions such as cataracts can make you more likely to have a fall. If you wear glasses, make sure to get your prescription updated if your vision changes.  Medicine review. Work with your health care provider to regularly review all of the medicines you are taking, including over-the-counter medicines. Ask your health care provider about any side effects that may make you more likely to have a fall. Tell your health care provider if any medicines that you take make you feel dizzy or sleepy.  Osteoporosis screening. Osteoporosis is a condition that causes the bones to get weaker. This can make the bones weak and cause them to break more easily.  Blood pressure screening. Blood pressure changes and medicines to control blood pressure can make you feel dizzy.  Strength and balance checks. Your health care provider may recommend certain tests to check your  strength and balance while standing, walking, or changing positions.  Foot health exam. Foot pain and numbness, as well as not wearing proper footwear, can make you more likely to have a fall.  Depression screening. You may be more likely to have a fall if you have a fear of falling, feel emotionally low, or feel unable to do activities that you used to do.  Alcohol use screening. Using too much alcohol can affect your balance and may make you more likely to have a fall. What actions can I take to lower my risk of falls? General instructions  Talk with your health care provider about your risks for falling. Tell your health care provider if: ? You fall. Be sure to tell your health care provider about all falls, even ones that seem minor. ? You feel dizzy, sleepy, or off-balance.  Take over-the-counter and prescription medicines only as told by your health care provider. These include any supplements.  Eat a healthy diet and maintain a healthy weight. A healthy diet includes low-fat dairy products, low-fat (lean) meats, and fiber from whole grains, beans, and lots of fruits and vegetables. Home safety  Remove any tripping hazards, such as rugs, cords, and clutter.  Install safety equipment such as grab bars in bathrooms and safety rails on stairs.  Keep rooms and walkways well-lit. Activity   Follow a regular exercise program to stay fit. This will help you maintain your balance. Ask your health care provider what types of exercise are appropriate for you.  If you need a cane or   walker, use it as recommended by your health care provider.  Wear supportive shoes that have nonskid soles. Lifestyle  Do not drink alcohol if your health care provider tells you not to drink.  If you drink alcohol, limit how much you have: ? 0-1 drink a day for women. ? 0-2 drinks a day for men.  Be aware of how much alcohol is in your drink. In the [place] one drink equals one typical bottle of beer (12  oz), one-half glass of wine (5 oz), or one shot of hard liquor (1 oz).  Do not use any products that contain nicotine or tobacco, such as cigarettes and e-cigarettes. If you need help quitting, ask your health care provider. Summary  Having a healthy lifestyle and getting preventive care can help to protect your health and wellness after age 68.  Screening and testing are the best way to find a health problem early and help you avoid having a fall. Early diagnosis and treatment give you the best chance for managing medical conditions that are more common for people who are older than age 68.  Falls are a major cause of broken bones and head injuries in people who are older than age 68. Take precautions to prevent a fall at home.  Work with your health care provider to learn what changes you can make to improve your health and wellness and to prevent falls. This information is not intended to replace advice given to you by your health care provider. Make sure you discuss any questions you have with your health care provider. Document Revised: 02/16/2019 Document Reviewed: 09/08/2017 Elsevier Patient Education  2020 Elsevier Inc.  

## 2020-07-02 NOTE — Assessment & Plan Note (Signed)
Needs paperwork for methadone clinic. Physical done today. Labs done. Continue to monitor.

## 2020-07-02 NOTE — Assessment & Plan Note (Signed)
Under great control with A1c of 5.1. Continue diet and exercise. Recheck 6 months. Call with any concerns.

## 2020-07-02 NOTE — Assessment & Plan Note (Signed)
Will restart her abilify. Recheck 1 month. Checking labs today. Call with any concerns.

## 2020-07-02 NOTE — Progress Notes (Signed)
BP (!) 158/88 (BP Location: Left Arm, Cuff Size: Normal)   Pulse (!) 109   Temp 99.6 F (37.6 C) (Oral)   Ht 5' 5.75" (1.67 m)   Wt 125 lb 12.8 oz (57.1 kg)   SpO2 91%   BMI 20.46 kg/m    Subjective:    Patient ID: Kim Graham, female    DOB: 1951-05-30, 69 y.o.   MRN: [id number]  HPI: Kim Graham is a 69 y.o. female presenting on 07/02/2020 for comprehensive medical examination. Current medical complaints include:  DEPRESSION- has been off her medicine for a couple of months now and feeling terrible Mood status: exacerbated Satisfied with current treatment?: no Symptom severity: moderate  Duration of current treatment : months Side effects: no Medication compliance: poor compliance Psychotherapy/counseling: no  Previous psychiatric medications: abilify Depressed mood: yes Anxious mood: yes Anhedonia: no Significant weight loss or gain: yes Insomnia: yes hard to fall asleep Fatigue: yes Feelings of worthlessness or guilt: yes Impaired concentration/indecisiveness: yes Suicidal ideations: no Hopelessness: yes Crying spells: yes Depression screen [facility] 2/9 07/02/2020 05/17/2020 06/07/2019 03/27/2019 09/19/2018  Decreased Interest 3 0 0 0 0  Down, Depressed, Hopeless 2 0 0 0 0  PHQ - 2 Score 5 0 0 0 0  Altered sleeping 2 - Tired, decreased energy 3 - 2 2 0  Change in appetite 3 - 1 0 0  Feeling bad or failure about yourself  2 - 2 0 0  Trouble concentrating 2 - 1 0 0  Moving slowly or fidgety/restless 0 - 0 0 0  Suicidal thoughts 0 - 0 0 0  PHQ-9 Score 17 - Difficult doing work/chores Very difficult - - - -  Some recent data might be hidden   DIABETES Hypoglycemic episodes:no Polydipsia/polyuria: no Visual disturbance: no Chest pain: no Paresthesias: no Glucose Monitoring: no  Accucheck frequency: Not Checking Taking Insulin?: no Blood Pressure Monitoring: not checking Retinal Examination: Not up to Date Foot Exam: Not up to Date Diabetic  Education: Not Completed Pneumovax: Up to Date Influenza: not in stock Aspirin: no  Menopausal Symptoms: no  Depression Screen done today and results listed below:  Depression screen [facility] 2/9 07/02/2020 05/17/2020 06/07/2019 03/27/2019 09/19/2018  Decreased Interest 3 0 0 0 0  Down, Depressed, Hopeless 2 0 0 0 0  PHQ - 2 Score 5 0 0 0 0  Altered sleeping 2 - Tired, decreased energy 3 - 2 2 0  Change in appetite 3 - 1 0 0  Feeling bad or failure about yourself  2 - 2 0 0  Trouble concentrating 2 - 1 0 0  Moving slowly or fidgety/restless 0 - 0 0 0  Suicidal thoughts 0 - 0 0 0  PHQ-9 Score 17 - Difficult doing work/chores Very difficult - - - -  Some recent data might be hidden    Past Medical History:  Past Medical History:  Diagnosis Date  . COPD (chronic obstructive pulmonary disease) (HCC)     Surgical History:  History reviewed. No pertinent surgical history.  Medications:  Current Outpatient Medications on File Prior to Visit  Medication Sig  . ibuprofen (ADVIL) 600 MG tablet Take 1 tablet (600 mg total) by mouth every 8 (eight) hours as needed.  . METHADONE HCL PO Take 83 mg by mouth daily.   No current facility-administered medications on file prior to visit.    Allergies:  No Known Allergies  Social History:  Social History   Socioeconomic History  . Marital status: Single    Spouse name: Not on file  . Number of children: Not on file  . Years of education: Not on file  . Highest education level: Not on file  Occupational History  . Not on file  Tobacco Use  . Smoking status: Current Every Day Smoker    Packs/day: 0.75    Years: 49.00    Pack years: 36.75    Last attempt to quit: 12/13/2018    Years since quitting: 1.5  . Smokeless tobacco: Never Used  Vaping Use  . Vaping Use: Never used  Substance and Sexual Activity  . Alcohol use: Not Currently    Comment: On occasion  . Drug use: No  . Sexual activity: Not Currently  Other  Topics Concern  . Not on file  Social History Narrative  . Not on file   Social Determinants of Health   Financial Resource Strain: Medium Risk  . Difficulty of Paying Living Expenses: Somewhat hard  Food Insecurity: No Food Insecurity  . Worried About Programme researcher, broadcasting/film/video in the Last Year: Never true  . Ran Out of Food in the Last Year: Never true  Transportation Needs: No Transportation Needs  . Lack of Transportation (Medical): No  . Lack of Transportation (Non-Medical): No  Physical Activity: Inactive  . Days of Exercise per Week: 0 days  . Minutes of Exercise per Session: 0 min  Stress: No Stress Concern Present  . Feeling of Stress : Not at all  Social Connections:   . Frequency of Communication with Friends and Family: Not on file  . Frequency of Social Gatherings with Friends and Family: Not on file  . Attends Religious Services: Not on file  . Active Member of Clubs or Organizations: Not on file  . Attends Banker Meetings: Not on file  . Marital Status: Not on file  Intimate Partner Violence:   . Fear of Current or Ex-Partner: Not on file  . Emotionally Abused: Not on file  . Physically Abused: Not on file  . Sexually Abused: Not on file   Social History   Tobacco Use  Smoking Status Current Every Day Smoker  . Packs/day: 0.75  . Years: 49.00  . Pack years: 36.75  . Last attempt to quit: 12/13/2018  . Years since quitting: 1.5  Smokeless Tobacco Never Used   Social History   Substance and Sexual Activity  Alcohol Use Not Currently   Comment: On occasion    Family History:  Family History  Problem Relation Age of Onset  . Diabetes Mother   . Cancer Father        Pancreatic  . Diabetes Maternal Aunt   . Diabetes Maternal Grandmother   . Dementia Maternal Grandfather   . Heart disease Maternal Grandfather     Past medical history, surgical history, medications, allergies, family history and social history reviewed with patient today and  changes made to appropriate areas of the chart.   Review of Systems  Constitutional: Negative.   HENT: Negative.   Eyes: Negative.   Respiratory: Negative.   Cardiovascular: Negative.   Gastrointestinal: Negative.   Genitourinary: Negative.   Musculoskeletal: Positive for back pain and myalgias. Negative for falls, joint pain and neck pain.  Skin: Negative.   Neurological: Negative.   Endo/Heme/Allergies: Negative.   Psychiatric/Behavioral: Positive for depression. Negative for hallucinations, memory loss, substance abuse and suicidal  ideas. The patient is not nervous/anxious and does not have insomnia.     All other ROS negative except what is listed above and in the HPI.      Objective:    BP (!) 158/88 (BP Location: Left Arm, Cuff Size: Normal)   Pulse (!) 109   Temp 99.6 F (37.6 C) (Oral)   Ht 5' 5.75" (1.67 m)   Wt 125 lb 12.8 oz (57.1 kg)   SpO2 91%   BMI 20.46 kg/m   Wt Readings from Last 3 Encounters:  07/02/20 125 lb 12.8 oz (57.1 kg)  05/17/20 130 lb (59 kg)  04/26/20 130 lb (59 kg)    Physical Exam Vitals and nursing note reviewed.  Constitutional:      General: She is not in acute distress.    Appearance: Normal appearance. She is not ill-appearing, toxic-appearing or diaphoretic.  HENT:     Head: Normocephalic and atraumatic.     Right Ear: Tympanic membrane, ear canal and external ear normal. There is no impacted cerumen.     Left Ear: Tympanic membrane, ear canal and external ear normal. There is no impacted cerumen.     Nose: Nose normal. No congestion or rhinorrhea.     Mouth/Throat:     Mouth: Mucous membranes are moist.     Pharynx: Oropharynx is clear. No oropharyngeal exudate or posterior oropharyngeal erythema.  Eyes:     General: No scleral icterus.       Right eye: No discharge.        Left eye: No discharge.     Extraocular Movements: Extraocular movements intact.     Conjunctiva/sclera: Conjunctivae normal.     Pupils: Pupils are  equal, round, and reactive to light.  Neck:     Vascular: No carotid bruit.  Cardiovascular:     Rate and Rhythm: Normal rate and regular rhythm.     Pulses: Normal pulses.     Heart sounds: No murmur heard.  No friction rub. No gallop.   Pulmonary:     Effort: Pulmonary effort is normal. No respiratory distress.     Breath sounds: Normal breath sounds. No stridor. No wheezing, rhonchi or rales.  Chest:     Chest wall: No tenderness.  Abdominal:     General: Abdomen is flat. Bowel sounds are normal. There is no distension.     Palpations: Abdomen is soft. There is no mass.     Tenderness: There is no abdominal tenderness. There is no right CVA tenderness, left CVA tenderness, guarding or rebound.     Hernia: No hernia is present.  Genitourinary:    Comments: Breast and pelvic exams deferred with shared decision making Musculoskeletal:        General: No swelling, tenderness, deformity or signs of injury.     Cervical back: Normal range of motion and neck supple. No rigidity. No muscular tenderness.     Right lower leg: No edema.     Left lower leg: No edema.  Lymphadenopathy:     Cervical: No cervical adenopathy.  Skin:    General: Skin is warm and dry.     Capillary Refill: Capillary refill takes less than 2 seconds.     Coloration: Skin is not jaundiced or pale.     Findings: No bruising, erythema, lesion or rash.     Comments: 1 inch scab on anterior L forearm. Well healing.   Neurological:     General: No focal deficit present.  Mental Status: She is alert and oriented to person, place, and time. Mental status is at baseline.     Cranial Nerves: No cranial nerve deficit.     Sensory: No sensory deficit.     Motor: No weakness.     Coordination: Coordination normal.     Gait: Gait normal.     Deep Tendon Reflexes: Reflexes normal.     Comments: jittery  Psychiatric:        Mood and Affect: Mood normal.        Behavior: Behavior normal.        Thought Content:  Thought content normal.        Judgment: Judgment normal.     6CIT Screen 07/02/2020 05/17/2020 09/19/2018 06/18/2017  What Year? 0 points 0 points 0 points 0 points  What month? 0 points 0 points 0 points 0 points  What time? 0 points 0 points 0 points 0 points  Count back from 20 0 points 0 points 0 points 0 points  Months in reverse 0 points 0 points 0 points 0 points  Repeat phrase 0 points 2 points 2 points 0 points  Total Score 0 2 2 0    ' Results for orders placed or performed in visit on 07/02/20  Microscopic Examination   Urine  Result Value Ref Range   WBC, UA 11-30 (A) 0 - 5 /hpf   RBC >30 (A) 0 - 2 /hpf   Epithelial Cells (non renal) 0-10 0 - 10 /hpf   Bacteria, UA Moderate (A) None seen/Few  Bayer DCA Hb A1c Waived  Result Value Ref Range   HB A1C (BAYER DCA - WAIVED) 5.1 <7.0 %  Microalbumin, Urine Waived  Result Value Ref Range   Microalb, Ur Waived 150 (H) 0 - 19 mg/L   Creatinine, Urine Waived 100 10 - 300 mg/dL   Microalb/Creat Ratio >300 (H) <30 mg/g  Urinalysis, Routine w reflex microscopic  Result Value Ref Range   Specific Gravity, UA 1.025 1.005 - 1.030   pH, UA 6.5 5.0 - 7.5   Color, UA Yellow Yellow   Appearance Ur Hazy (A) Clear   Leukocytes,UA 2+ (A) Negative   Protein,UA 1+ (A) Negative/Trace   Glucose, UA Negative Negative   Ketones, UA Negative Negative   RBC, UA 3+ (A) Negative   Bilirubin, UA Negative Negative   Urobilinogen, Ur >=8.0 0.2 - 1.0 mg/dL   Nitrite, UA Negative Negative   Microscopic Examination See below:       Assessment & Plan:   Problem List Items Addressed This Visit      Respiratory   Chronic obstructive pulmonary disease (HCC)    Under good control on current regimen. Continue current regimen. Continue to monitor. Call with any concerns. Refills given. Labs drawn today.       Relevant Medications   ANORO ELLIPTA 62.5-25 MCG/INH AEPB   albuterol (VENTOLIN HFA) 108 (90 Base) MCG/ACT inhaler   Other Relevant  Orders   CBC with Differential/Platelet   TSH     Endocrine   Diabetes mellitus type 2, diet-controlled (HCC)    Under great control with A1c of 5.1. Continue diet and exercise. Recheck 6 months. Call with any concerns.       Relevant Orders   Bayer DCA Hb A1c Waived (Completed)   Comprehensive metabolic panel   Lipid Panel w/o Chol/HDL Ratio   Microalbumin, Urine Waived (Completed)   Urinalysis, Routine w reflex microscopic (Completed)     Other  Depression, recurrent (HCC)    Will restart her abilify. Recheck 1 month. Checking labs today. Call with any concerns.       Methadone use (HCC)    Needs paperwork for methadone clinic. Physical done today. Labs done. Continue to monitor.        Other Visit Diagnoses    Routine general medical examination at a health care facility    -  Primary   Routine screening for STI (sexually transmitted infection)       Labs drawn today. Await results. Treat as needed.    Relevant Orders   Hepatitis, Acute   HIV Antibody (routine testing w rflx)   RPR   Hepatitis B surface antibody,quantitative   Screening for tuberculosis       Labs drawn today. Await results. Treat as needed.    Relevant Orders   QuantiFERON-TB Gold Plus   Long-term use of high-risk medication       Labs drawn today. Await results. Treat as needed.    Relevant Orders   [id number] 11+Oxyco+Alc+Crt-Bund   Open wound of left forearm, initial encounter       Well healing wound. Due for Td. Given today.   Screening for osteoporosis       DEXA ordered today.   Relevant Orders   DG Bone Density   Encounter for screening mammogram for malignant neoplasm of breast       Mammogram ordered today.   Relevant Orders   MM 3D SCREEN BREAST BILATERAL       Follow up plan: Return in about 4 weeks (around 07/30/2020).   LABORATORY TESTING:  - Pap smear: not applicable  IMMUNIZATIONS:   - Tdap: Tetanus vaccination status reviewed: Td vaccination indicated and given  today. - Influenza: Postponed to flu season - Pneumovax: Up to date - Prevnar: Up to date - COVID: Up to date  SCREENING: -Mammogram: Ordered today  - Colonoscopy: Refused  - Bone Density: Ordered today   PATIENT COUNSELING:   Advised to take 1 mg of folate supplement per day if capable of pregnancy.   Sexuality: Discussed sexually transmitted diseases, partner selection, use of condoms, avoidance of unintended pregnancy  and contraceptive alternatives.   Advised to avoid cigarette smoking.  I discussed with the patient that most people either abstain from alcohol or drink within safe limits (<=14/week and <=4 drinks/occasion for males, <=7/weeks and <= 3 drinks/occasion for females) and that the risk for alcohol disorders and other health effects rises proportionally with the number of drinks per week and how often a drinker exceeds daily limits.  Discussed cessation/primary prevention of drug use and availability of treatment for abuse.   Diet: Encouraged to adjust caloric intake to maintain  or achieve ideal body weight, to reduce intake of dietary saturated fat and total fat, to limit sodium intake by avoiding high sodium foods and not adding table salt, and to maintain adequate dietary potassium and calcium preferably from fresh fruits, vegetables, and low-fat dairy products.    stressed the importance of regular exercise  Injury prevention: Discussed safety belts, safety helmets, smoke detector, smoking near bedding or upholstery.   Dental health: Discussed importance of regular tooth brushing, flossing, and dental visits.    NEXT PREVENTATIVE PHYSICAL DUE IN 1 YEAR. Return in about 4 weeks (around 07/30/2020).

## 2020-07-02 NOTE — Telephone Encounter (Signed)
[facility] called and stated Pt was prescribed the aripiprazole (ABILIFY) 15 MG disintegrating tablet and wanted the "normal kind" not the [place] disintegrating.[facility] would like new RX sent in order to change the medication.

## 2020-07-03 ENCOUNTER — Telehealth: Payer: Self-pay

## 2020-07-03 DIAGNOSIS — S42211D Unspecified displaced fracture of surgical neck of right humerus, subsequent encounter for fracture with routine healing: Secondary | ICD-10-CM | POA: Diagnosis not present

## 2020-07-03 NOTE — Telephone Encounter (Signed)
    MA8/25/2021 1st Attempt  Name: Kim Graham   MRN: [id number]   DOB: 06-20-1951   AGE: 69 y.o.   GENDER: female   PCP Dorcas Carrow, DO.   07/03/20 Spoke with patient about community resources for  utility and housing resources.  Will follow-up with patient by 8/30.   Eveline Sauve, AAS Paralegal, [facility] Care Guide . Embedded Care Coordination [facility]  Care Management  [address] [place], [place] [address] [email or web address]  [phone]  [email or web address]

## 2020-07-05 LAB — CBC WITH DIFFERENTIAL/PLATELET
Basophils Absolute: 0 10*3/uL (ref 0.0–0.2)
Basos: 1 %
EOS (ABSOLUTE): 0.2 10*3/uL (ref 0.0–0.4)
Eos: 3 %
Hematocrit: 48.2 % — ABNORMAL HIGH (ref 34.0–46.6)
Hemoglobin: 16.4 g/dL — ABNORMAL HIGH (ref 11.1–15.9)
Immature Grans (Abs): 0 10*3/uL (ref 0.0–0.1)
Immature Granulocytes: 0 %
Lymphocytes Absolute: 2 10*3/uL (ref 0.7–3.1)
Lymphs: 30 %
MCH: 31.6 pg (ref 26.6–33.0)
MCHC: 34 g/dL (ref 31.5–35.7)
MCV: 93 fL (ref 79–97)
Monocytes Absolute: 0.7 10*3/uL (ref 0.1–0.9)
Monocytes: 11 %
Neutrophils Absolute: 3.6 10*3/uL (ref 1.4–7.0)
Neutrophils: 55 %
Platelets: 137 10*3/uL — ABNORMAL LOW (ref 150–450)
RBC: 5.19 x10E6/uL (ref 3.77–5.28)
RDW: 12.6 % (ref 11.7–15.4)
WBC: 6.5 10*3/uL (ref 3.4–10.8)

## 2020-07-05 LAB — COMPREHENSIVE METABOLIC PANEL
ALT: 65 IU/L — ABNORMAL HIGH (ref 0–32)
AST: 96 IU/L — ABNORMAL HIGH (ref 0–40)
Albumin/Globulin Ratio: 1.5 (ref 1.2–2.2)
Albumin: 3.8 g/dL (ref 3.8–4.8)
Alkaline Phosphatase: 153 IU/L — ABNORMAL HIGH (ref 48–121)
BUN/Creatinine Ratio: 9 — ABNORMAL LOW (ref 12–28)
BUN: 6 mg/dL — ABNORMAL LOW (ref 8–27)
Bilirubin Total: 0.6 mg/dL (ref 0.0–1.2)
CO2: 30 mmol/L — ABNORMAL HIGH (ref 20–29)
Calcium: 9.1 mg/dL (ref 8.7–10.3)
Chloride: 101 mmol/L (ref 96–106)
Creatinine, Ser: 0.69 mg/dL (ref 0.57–1.00)
GFR calc Af Amer: 103 mL/min/{1.73_m2} (ref >59)
GFR calc non Af Amer: 90 mL/min/{1.73_m2} (ref >59)
Globulin, Total: 2.6 g/dL (ref 1.5–4.5)
Glucose: 119 mg/dL — ABNORMAL HIGH (ref 65–99)
Potassium: 3.5 mmol/L (ref 3.5–5.2)
Sodium: 146 mmol/L — ABNORMAL HIGH (ref 134–144)
Total Protein: 6.4 g/dL (ref 6.0–8.5)

## 2020-07-05 LAB — QUANTIFERON-TB GOLD PLUS
QuantiFERON Mitogen Value: >10 IU/mL
QuantiFERON Nil Value: 0.06 IU/mL
QuantiFERON TB1 Ag Value: 0.03 IU/mL
QuantiFERON TB2 Ag Value: 0.04 IU/mL
QuantiFERON-TB Gold Plus: NEGATIVE

## 2020-07-05 LAB — LIPID PANEL W/O CHOL/HDL RATIO
Cholesterol, Total: 136 mg/dL (ref 100–199)
HDL: 66 mg/dL (ref >39)
LDL Chol Calc (NIH): 55 mg/dL (ref 0–99)
Triglycerides: 74 mg/dL (ref 0–149)
VLDL Cholesterol Cal: 15 mg/dL (ref 5–40)

## 2020-07-05 LAB — HEPATITIS PANEL, ACUTE
Hep A IgM: NEGATIVE
Hep B C IgM: NEGATIVE
Hep C Virus Ab: >11 s/co ratio — ABNORMAL HIGH (ref 0.0–0.9)
Hepatitis B Surface Ag: NEGATIVE

## 2020-07-05 LAB — TSH: TSH: 1.35 u[IU]/mL (ref 0.450–4.500)

## 2020-07-05 LAB — HEPATITIS B SURFACE ANTIBODY, QUANTITATIVE: Hepatitis B Surf Ab Quant: <3.1 m[IU]/mL — ABNORMAL LOW (ref >9.9)

## 2020-07-05 LAB — RPR: RPR Ser Ql: NONREACTIVE

## 2020-07-05 LAB — HIV ANTIBODY (ROUTINE TESTING W REFLEX): HIV Screen 4th Generation wRfx: NONREACTIVE

## 2020-07-08 DIAGNOSIS — S42211D Unspecified displaced fracture of surgical neck of right humerus, subsequent encounter for fracture with routine healing: Secondary | ICD-10-CM | POA: Diagnosis not present

## 2020-07-09 ENCOUNTER — Ambulatory Visit: Payer: Self-pay | Admitting: Family Medicine

## 2020-07-09 LAB — DRUG SCREEN 764883 11+OXYCO+ALC+CRT-BUND
Amphetamines, Urine: NEGATIVE ng/mL
BENZODIAZ UR QL: NEGATIVE ng/mL
Barbiturate: NEGATIVE ng/mL
Cannabinoid Quant, Ur: NEGATIVE ng/mL
Cocaine (Metabolite): NEGATIVE ng/mL
Creatinine: 83.7 mg/dL (ref 20.0–300.0)
Ethanol: NEGATIVE %
Meperidine: NEGATIVE ng/mL
OPIATE SCREEN URINE: NEGATIVE ng/mL
Oxycodone/Oxymorphone, Urine: NEGATIVE ng/mL
Phencyclidine: NEGATIVE ng/mL
Propoxyphene: NEGATIVE ng/mL
Tramadol: NEGATIVE ng/mL
pH, Urine: 6.4 (ref 4.5–8.9)

## 2020-07-09 LAB — METHADONE CONF GC/MS
METHADONE GC/MS CONF: 11410 ng/mL
METHADONE: POSITIVE — AB

## 2020-07-09 NOTE — Telephone Encounter (Signed)
Pt reports swelling of both legs, "From ankles to knees." Non-pitting, mild-moderate. States right ankle "Pinkish" no warmth, no pain. HAs been keeping elevated, "Doesn't help."  Pt saw Dr. Laural Benes last Tuesday, states "Some swelling then but I didn't mention it, worse today." Also reports Dr. Laural Benes mentioned placing her on a BP medication, "Nothing called in." Pt unable to check BP during call, "Batteries are dead." Denies CP, no SOB. Assured pt NT would route to practice for Dr. Henriette Combs review. Last seen 07/02/20. Care advise given per protocol. Please advise: [phone]  Reason for Disposition . [1] MODERATE leg swelling (e.g., swelling extends up to knees) AND [2] new-onset or worsening  Answer Assessment - Initial Assessment Questions 1. ONSET: "When did the swelling start?" (e.g., minutes, hours, days)     Mild last Tuesday 2. LOCATION: "What part of the leg is swollen?"  "Are both legs swollen or just one leg?"     Ankles up to knees 3. SEVERITY: "How bad is the swelling?" (e.g., localized; mild, moderate, severe)  - Localized - small area of swelling localized to one leg  - MILD pedal edema - swelling limited to foot and ankle, pitting edema < 1/4 inch (6 mm) deep, rest and elevation eliminate most or all swelling  - MODERATE edema - swelling of lower leg to knee, pitting edema > 1/4 inch (6 mm) deep, rest and elevation only partially reduce swelling  - SEVERE edema - swelling extends above knee, facial or hand swelling present      Moderate 4. REDNESS: "Does the swelling look red or infected?"     "Some on right ankle" 5. PAIN: "Is the swelling painful to touch?" If Yes, ask: "How painful is it?"   (Scale 1-10; mild, moderate or severe)     no 6. FEVER: "Do you have a fever?" If Yes, ask: "What is it, how was it measured, and when did it start?"      no 7. CAUSE: "What do you think is causing the leg swelling?"     Unsure 8. MEDICAL HISTORY: "Do you have a history of heart  failure, kidney disease, liver failure, or cancer?"     *No Answer* 9. RECURRENT SYMPTOM: "Have you had leg swelling before?" If Yes, ask: "When was the last time?" "What happened that time?"    One year ago but not that bad. 10. OTHER SYMPTOMS: "Do you have any other symptoms?" (e.g., chest pain, difficulty breathing)       no  Protocols used: LEG SWELLING AND EDEMA-A-AH

## 2020-07-11 ENCOUNTER — Ambulatory Visit: Payer: Self-pay | Admitting: *Deleted

## 2020-07-11 ENCOUNTER — Ambulatory Visit (INDEPENDENT_AMBULATORY_CARE_PROVIDER_SITE_OTHER): Payer: Medicare Other | Admitting: Family Medicine

## 2020-07-11 ENCOUNTER — Encounter: Payer: Self-pay | Admitting: Family Medicine

## 2020-07-11 ENCOUNTER — Other Ambulatory Visit: Payer: Self-pay

## 2020-07-11 VITALS — BP 130/71 | HR 92 | Temp 98.5°F | Wt 134.0 lb

## 2020-07-11 DIAGNOSIS — L03115 Cellulitis of right lower limb: Secondary | ICD-10-CM | POA: Diagnosis not present

## 2020-07-11 DIAGNOSIS — B182 Chronic viral hepatitis C: Secondary | ICD-10-CM | POA: Diagnosis not present

## 2020-07-11 DIAGNOSIS — S42211D Unspecified displaced fracture of surgical neck of right humerus, subsequent encounter for fracture with routine healing: Secondary | ICD-10-CM | POA: Diagnosis not present

## 2020-07-11 DIAGNOSIS — Z23 Encounter for immunization: Secondary | ICD-10-CM

## 2020-07-11 DIAGNOSIS — R609 Edema, unspecified: Secondary | ICD-10-CM | POA: Diagnosis not present

## 2020-07-11 MED ORDER — FUROSEMIDE 20 MG PO TABS
20.0000 mg | ORAL_TABLET | ORAL | 0 refills | Status: DC
Start: 2020-07-11 — End: 2020-07-31

## 2020-07-11 MED ORDER — DOXYCYCLINE HYCLATE 100 MG PO TABS: 100.0000 mg | ORAL_TABLET | Freq: Two times a day (BID) | ORAL | 0 refills | Status: DC

## 2020-07-11 NOTE — Telephone Encounter (Signed)
    MA9/12/2019   Name: Kim Graham   MRN: [id number]   DOB: 05-27-1951   AGE: 69 y.o.   GENDER: female   PCP Dorcas Carrow, DO.   07/11/20 Spoke with patient about resources given [place] Counsellor for utility assistance she has not had time to call them, but plans to call them in the next few days.  Patient stated that she did not have any other needs at this time. Closing referral.   Daquavion Catala, AAS Paralegal, [facility] Care Guide . Embedded Care Coordination [facility]  Care Management  [address] [place], [place] [address] [email or web address]  [phone]   [email or web address]

## 2020-07-11 NOTE — Assessment & Plan Note (Signed)
Newly diagnosed. Will get her vaccinated for hep B as has not been. Will check viral load and genotype. If needs to be, will get her into see GI. Await results. Treat as needed.

## 2020-07-11 NOTE — Telephone Encounter (Signed)
Patient is calling to report swelling in her legs. She has been elevating- but the swelling is not better. Patient states she has never had swelling like this before- her R leg is swollen into the thigh and her L leg is swollen up to the knee.Patient did not hear back from office 8/31. Appointment scheduled for today.  Reason for Disposition . [1] Thigh, calf, or ankle swelling AND [2] bilateral AND [3] 1 side is more swollen  Answer Assessment - Initial Assessment Questions 1. ONSET: "When did the swelling start?" (e.g., minutes, hours, days)     Late last week-Th/Fr 2. LOCATION: "What part of the leg is swollen?"  "Are both legs swollen or just one leg?"     R- above knee, L to the knee(not as bad) 3. SEVERITY: "How bad is the swelling?" (e.g., localized; mild, moderate, severe)  - Localized - small area of swelling localized to one leg  - MILD pedal edema - swelling limited to foot and ankle, pitting edema < 1/4 inch (6 mm) deep, rest and elevation eliminate most or all swelling  - MODERATE edema - swelling of lower leg to knee, pitting edema > 1/4 inch (6 mm) deep, rest and elevation only partially reduce swelling  - SEVERE edema - swelling extends above knee, facial or hand swelling present      Moderate/severe 4. REDNESS: "Does the swelling look red or infected?"     Feels tight- R redness at ankle 5. PAIN: "Is the swelling painful to touch?" If Yes, ask: "How painful is it?"   (Scale 1-10; mild, moderate or severe)     No pain 6. FEVER: "Do you have a fever?" If Yes, ask: "What is it, how was it measured, and when did it start?"      no 7. CAUSE: "What do you think is causing the leg swelling?"     Never has swelling like this before 8. MEDICAL HISTORY: "Do you have a history of heart failure, kidney disease, liver failure, or cancer?"     no 9. RECURRENT SYMPTOM: "Have you had leg swelling before?" If Yes, ask: "When was the last time?" "What happened that time?"     Some knee- but  nothing like this 10. OTHER SYMPTOMS: "Do you have any other symptoms?" (e.g., chest pain, difficulty breathing)       no 11. PREGNANCY: "Is there any chance you are pregnant?" "When was your last menstrual period?"       n/a  Protocols used: LEG SWELLING AND EDEMA-A-AH

## 2020-07-11 NOTE — Progress Notes (Signed)
BP 130/71 (BP Location: Left Arm, Patient Position: Sitting, Cuff Size: Normal)   Pulse 92   Temp 98.5 F (36.9 C) (Oral)   Wt 134 lb (60.8 kg)   SpO2 95%   BMI 21.79 kg/m    Subjective:    Patient ID: Kim Graham, female    DOB: 1951-08-11, 69 y.o.   MRN: [id number]  HPI: Kim Graham is a 69 y.o. female  Chief Complaint  Patient presents with  . Leg Swelling   Legs have been swelling for about a week. R leg worse than the left. No changes. Nothing different. Has been red and feeling tight and bit hot as well.  HEPATITIS C Duration since diagnosis: new onset Hep C transmission: unsure, but had boyfriend who has it Genotype: unknown Viral load:  unknown Hepatology evaluation:no Liver biopsy:no  Cirrhosis: no Antiviral therapy:no Hepatocellular carcinoma screening: no Esophageal varices screening/EGD: no Hepatitis A Vaccine: unknown Hepatitis B Vaccine: Not up to Date Pneumovax Vaccine: Up to Date   Relevant past medical, surgical, family and social history reviewed and updated as indicated. Interim medical history since our last visit reviewed. Allergies and medications reviewed and updated.  Review of Systems  Constitutional: Negative.   Respiratory: Negative.   Cardiovascular: Positive for leg swelling. Negative for chest pain and palpitations.  Gastrointestinal: Negative.   Musculoskeletal: Negative.   Neurological: Negative.   Hematological: Negative.   Psychiatric/Behavioral: Negative.     Per HPI unless specifically indicated above     Objective:    BP 130/71 (BP Location: Left Arm, Patient Position: Sitting, Cuff Size: Normal)   Pulse 92   Temp 98.5 F (36.9 C) (Oral)   Wt 134 lb (60.8 kg)   SpO2 95%   BMI 21.79 kg/m   Wt Readings from Last 3 Encounters:  07/11/20 134 lb (60.8 kg)  07/02/20 125 lb 12.8 oz (57.1 kg)  05/17/20 130 lb (59 kg)    Physical Exam Vitals and nursing note reviewed.  Constitutional:      General: She is not  in acute distress.    Appearance: Normal appearance. She is not ill-appearing, toxic-appearing or diaphoretic.  HENT:     Head: Normocephalic and atraumatic.     Right Ear: External ear normal.     Left Ear: External ear normal.     Nose: Nose normal.     Mouth/Throat:     Mouth: Mucous membranes are moist.     Pharynx: Oropharynx is clear.  Eyes:     General: No scleral icterus.       Right eye: No discharge.        Left eye: No discharge.     Extraocular Movements: Extraocular movements intact.     Conjunctiva/sclera: Conjunctivae normal.     Pupils: Pupils are equal, round, and reactive to light.  Cardiovascular:     Rate and Rhythm: Normal rate and regular rhythm.     Pulses: Normal pulses.     Heart sounds: Normal heart sounds. No murmur heard.  No friction rub. No gallop.   Pulmonary:     Effort: Pulmonary effort is normal. No respiratory distress.     Breath sounds: Normal breath sounds. No stridor. No wheezing, rhonchi or rales.  Chest:     Chest wall: No tenderness.  Musculoskeletal:        General: Normal range of motion.     Cervical back: Normal range of motion and neck supple.     Right lower  leg: Edema (2+ with erythema and heat midway up shin) present.     Left lower leg: Edema (2+) present.  Skin:    General: Skin is warm and dry.     Capillary Refill: Capillary refill takes less than 2 seconds.     Coloration: Skin is not jaundiced or pale.     Findings: No bruising, erythema, lesion or rash.  Neurological:     General: No focal deficit present.     Mental Status: She is alert and oriented to person, place, and time. Mental status is at baseline.  Psychiatric:        Mood and Affect: Mood normal.        Behavior: Behavior normal.        Thought Content: Thought content normal.        Judgment: Judgment normal.     Results for orders placed or performed in visit on 07/02/20  Microscopic Examination   Urine  Result Value Ref Range   WBC, UA 11-30 (A)  0 - 5 /hpf   RBC >30 (A) 0 - 2 /hpf   Epithelial Cells (non renal) 0-10 0 - 10 /hpf   Bacteria, UA Moderate (A) None seen/Few  CBC with Differential/Platelet  Result Value Ref Range   WBC 6.5 3.4 - 10.8 x10E3/uL   RBC 5.19 3.77 - 5.28 x10E6/uL   Hemoglobin 16.4 (H) 11.1 - 15.9 g/dL   Hematocrit 69.6 (H) 78.9 - 46.6 %   MCV 93 79 - 97 fL   MCH 31.6 26.6 - 33.0 pg   MCHC 34.0 31 - 35 g/dL   RDW 38.1 01.7 - 51.0 %   Platelets 137 (L) 150 - 450 x10E3/uL   Neutrophils 55 Not Estab. %   Lymphs 30 Not Estab. %   Monocytes 11 Not Estab. %   Eos 3 Not Estab. %   Basos 1 Not Estab. %   Neutrophils Absolute 3.6 1 - 7 x10E3/uL   Lymphocytes Absolute 2.0 0 - 3 x10E3/uL   Monocytes Absolute 0.7 0 - 0 x10E3/uL   EOS (ABSOLUTE) 0.2 0.0 - 0.4 x10E3/uL   Basophils Absolute 0.0 0 - 0 x10E3/uL   Immature Granulocytes 0 Not Estab. %   Immature Grans (Abs) 0.0 0.0 - 0.1 x10E3/uL  Bayer DCA Hb A1c Waived  Result Value Ref Range   HB A1C (BAYER DCA - WAIVED) 5.1 <7.0 %  Comprehensive metabolic panel  Result Value Ref Range   Glucose 119 (H) 65 - 99 mg/dL   BUN 6 (L) 8 - 27 mg/dL   Creatinine, Ser 2.58 0.57 - 1.00 mg/dL   GFR calc non Af Amer 90 >59 mL/min/1.73   GFR calc Af Amer 103 >59 mL/min/1.73   BUN/Creatinine Ratio 9 (L) 12 - 28   Sodium 146 (H) 134 - 144 mmol/L   Potassium 3.5 3.5 - 5.2 mmol/L   Chloride 101 96 - 106 mmol/L   CO2 30 (H) 20 - 29 mmol/L   Calcium 9.1 8.7 - 10.3 mg/dL   Total Protein 6.4 6.0 - 8.5 g/dL   Albumin 3.8 3.8 - 4.8 g/dL   Globulin, Total 2.6 1.5 - 4.5 g/dL   Albumin/Globulin Ratio 1.5 1.2 - 2.2   Bilirubin Total 0.6 0.0 - 1.2 mg/dL   Alkaline Phosphatase 153 (H) 48 - 121 IU/L   AST 96 (H) 0 - 40 IU/L   ALT 65 (H) 0 - 32 IU/L  Lipid Panel w/o Chol/HDL Ratio  Result Value Ref  Range   Cholesterol, Total 136 100 - 199 mg/dL   Triglycerides 74 0 - 149 mg/dL   HDL 66 >24 mg/dL   VLDL Cholesterol Cal 15 5 - 40 mg/dL   LDL Chol Calc (NIH) 55 0 - 99 mg/dL    Microalbumin, Urine Waived  Result Value Ref Range   Microalb, Ur Waived 150 (H) 0 - 19 mg/L   Creatinine, Urine Waived 100 10 - 300 mg/dL   Microalb/Creat Ratio >300 (H) <30 mg/g  TSH  Result Value Ref Range   TSH 1.350 0.450 - 4.500 uIU/mL  Urinalysis, Routine w reflex microscopic  Result Value Ref Range   Specific Gravity, UA 1.025 1.005 - 1.030   pH, UA 6.5 5.0 - 7.5   Color, UA Yellow Yellow   Appearance Ur Hazy (A) Clear   Leukocytes,UA 2+ (A) Negative   Protein,UA 1+ (A) Negative/Trace   Glucose, UA Negative Negative   Ketones, UA Negative Negative   RBC, UA 3+ (A) Negative   Bilirubin, UA Negative Negative   Urobilinogen, Ur >=8.0 0.2 - 1.0 mg/dL   Nitrite, UA Negative Negative   Microscopic Examination See below:   [id number] 11+Oxyco+Alc+Crt-Bund  Result Value Ref Range   Ethanol Negative Cutoff=0.020 %   Amphetamines, Urine Negative Cutoff=1000 ng/mL   Barbiturate Negative Cutoff=200 ng/mL   BENZODIAZ UR QL Negative Cutoff=200 ng/mL   Cannabinoid Quant, Ur Negative Cutoff=50 ng/mL   Cocaine (Metabolite) Negative Cutoff=300 ng/mL   OPIATE SCREEN URINE Negative Cutoff=300 ng/mL   Oxycodone/Oxymorphone, Urine Negative Cutoff=300 ng/mL   Phencyclidine Negative Cutoff=25 ng/mL   Methadone Screen, Urine See Final Results Cutoff=300 ng/mL   Propoxyphene Negative Cutoff=300 ng/mL   Meperidine Negative Cutoff=200 ng/mL   Tramadol Negative Cutoff=200 ng/mL   Creatinine 83.7 20.0 - 300.0 mg/dL   pH, Urine 6.4 4.5 - 8.9  Hepatitis panel, acute  Result Value Ref Range   Hep A IgM Negative Negative   Hepatitis B Surface Ag Negative Negative   Hep B C IgM Negative Negative   Hep C Virus Ab >11.0 (H) 0.0 - 0.9 s/co ratio  QuantiFERON-TB Gold Plus  Result Value Ref Range   QuantiFERON Incubation Incubation performed.    QuantiFERON Criteria Comment    QuantiFERON TB1 Ag Value 0.03 IU/mL   QuantiFERON TB2 Ag Value 0.04 IU/mL   QuantiFERON Nil Value 0.06 IU/mL   QuantiFERON  Mitogen Value >10.00 IU/mL   QuantiFERON-TB Gold Plus Negative Negative  Hepatitis B surface antibody,quantitative  Result Value Ref Range   Hepatitis B Surf Ab Quant <3.1 (L) Immunity>9.9 mIU/mL  RPR  Result Value Ref Range   RPR Ser Ql Non Reactive Non Reactive  HIV Antibody (routine testing w rflx)  Result Value Ref Range   HIV Screen 4th Generation wRfx Non Reactive Non Reactive  Methadone Conf Gc/Ms  Result Value Ref Range   METHADONE Positive (A) Cutoff=300   METHADONE GC/MS CONF 11,410 Cutoff=300 ng/mL      Assessment & Plan:   Problem List Items Addressed This Visit      Digestive   Hep C w/o coma, chronic (HCC) - Primary    Newly diagnosed. Will get her vaccinated for hep B as has not been. Will check viral load and genotype. If needs to be, will get her into see GI. Await results. Treat as needed.       Relevant Orders   HCV RNA quant   Hepatitis C Genotype    Other Visit Diagnoses    Peripheral  edema       ?secondary to cellulitis or hep c. Will check BNP. Labs normal last week except hep c. Start lasix and treat cellulitis. Recheck about 2 weeks.    Relevant Orders   B Nat Peptide   Flu vaccine need       Flu shot given.    Relevant Orders   Flu Vaccine QUAD 6+ mos PF IM (Fluarix Quad PF) (Completed)   Cellulitis of right leg       Will treat with doxycycline. Call with any concerns.        Follow up plan: Return as scheduled.

## 2020-07-11 NOTE — Patient Instructions (Signed)
Hepatitis C Hepatitis C is a liver infection that is caused by the hepatitis C virus (HCV). The virus infects and causes inflammation in the liver. Hepatitis C can lead to:  A condition where the liver cannot work anymore (liver failure).  Scarring of the liver (cirrhosis).  Liver cancer. People with hepatitis C often do not know for months or years that they have it. This is because they often do not have symptoms or may have only mild symptoms. What are the causes? This condition is caused by HCV. The virus can spread from person to person (is contagious) through:  Contact with an infected person's blood, semen, or vaginal fluids.  Childbirth. A woman who has hepatitis C can pass it to her baby during birth.  Donated blood (blood transfusion) or a donated body organ (organ transplantation) if received in the [place] before 1992. What increases the risk? The following factors may make you more likely to develop this condition:  Having contact with needles or syringes that have HCV on them (are contaminated). Contact may happen while: ? Receiving acupuncture. ? Getting a tattoo. ? Getting a body piercing. ? Injecting drugs.  Having sex with someone who is infected. The virus can spread through vaginal, oral, or anal sex.  Receiving treatment to filter your blood (kidney dialysis).  Having HIV (human immunodeficiency virus) or AIDS (acquired immunodeficiency syndrome).  Having a job that involves contact with blood or certain other body fluids, such as in health care. What are the signs or symptoms? Symptoms of this condition include:  Tiredness (fatigue).  Loss of appetite.  Nausea or vomiting.  Pain in your abdomen.  Dark yellow urine.  Your skin or the white parts of your eyes turning yellow (jaundice).  Itchy skin.  Light-colored or tan stool.  Joint pain.  Bleeding and bruising that happen often.  Fluid building up in your stomach (ascites). Often,  hepatitis C causes no symptoms. How is this diagnosed? This condition is diagnosed with:  Blood tests.  Other tests of how well your liver is working. These may include: ? Magnetic resonance elastography (MRE). This imaging test uses MRI and sound waves to measure liver stiffness. ? Transient elastography. This imaging test uses ultrasound to measure liver stiffness. ? Liver biopsy. This test involves taking a tissue sample from your liver to look at under a microscope. How is this treated? Treatment may depend on how severe your condition is, how long it has lasted, and whether you have liver damage. More testing may be done to figure out the best treatment. Treatment may include:  Taking antiviral medicines and other medicines.  Having follow-up treatments every 6-12 months for infections or other liver problems.  Having liver transplantation. Follow these instructions at home: Medicines  Take over-the-counter and prescription medicines only as told by your health care provider.  If you were prescribed an antiviral medicine, take it as told by your health care provider. Do not stop using the antiviral even if you start to feel better.  Do not take any new medicines, including over-the-counter medicines or supplements, unless your health care provider approves. Activity  Rest as needed.  Do not have sex unless approved by your health care provider.  Return to your normal activities as told by your health care provider. Ask your health care provider what activities are safe for you.  Ask your health care provider when you may return to school or work. Eating and drinking   Eat a balanced diet   with plenty of fruits and vegetables, whole grains, and lean meats or non-meat proteins (such as beans or tofu).  Drink enough fluids to keep your urine pale yellow.  Do not drink alcohol. General instructions  Do not share toothbrushes, nail clippers, or razors.  Wash your hands  often with soap and water for at least 20 seconds. If soap and water are not available, use hand sanitizer.  Cover any cuts or open sores on your skin to prevent spreading HCV.  Avoid swimming or using hot tubs if you have open sores or wounds.  Keep all follow-up visits as told by your health care provider. This is important. You may need follow-up visits every 6-12 months. How is this prevented? There is no vaccine for hepatitis C. The only way to prevent this infection is to lessen your risk of coming into contact with HCV. Make sure you:  Wash your hands often with soap and water for at least 20 seconds.  Do not share needles or syringes.  Use a condom every time you have vaginal, oral, or anal sex. Be sure to use it correctly each time. ? Both females and males should wear condoms. ? Condoms should be kept in place from the beginning to the end of sexual activity. ? Latex condoms should be used, if possible. These offer the best protection.  Avoid handling blood or other body fluids without gloves or other protection.  Avoid getting tattoos or body piercings in shops or other places that are not clean. Where to find more information  Centers for Disease Control and Prevention: SaveSearches.co.nz Contact a health care provider if you:  Have a fever.  Have pain in your abdomen.  Pass dark urine.  Pass light-colored or tan stool.  Have joint pain. Get help right away if you:  Have an increase in fatigue or weakness.  Lose your appetite.  Cannot eat or drink without vomiting.  Develop jaundice or your jaundice gets worse.  Bruise or bleed easily. Summary  Hepatitis C is a liver infection that is caused by the hepatitis C virus (HCV). This infection can lead to a condition where the liver cannot work anymore (liver failure), scarring of the liver (cirrhosis), or liver cancer.  HCV causes this condition and can spread from person to person (is contagious).  Do  not take any medicines, including over-the-counter medicines or supplements, unless your health care provider approves. This information is not intended to replace advice given to you by your health care provider. Make sure you discuss any questions you have with your health care provider. Document Revised: 07/19/2019 Document Reviewed: 06/26/2019 Elsevier Patient Education  2020 Elsevier Inc. Hepatitis B Vaccine: What You Need to Know 1. Why get vaccinated? Hepatitis B vaccine can prevent hepatitis B. Hepatitis B is a liver disease that can cause mild illness lasting a few weeks, or it can lead to a serious, lifelong illness.  Acute hepatitis B infection is a short-term illness that can lead to fever, fatigue, loss of appetite, nausea, vomiting, jaundice (yellow skin or eyes, dark urine, clay-colored bowel movements), and pain in the muscles, joints, and stomach.  Chronic hepatitis B infection is a long-term illness that occurs when the hepatitis B virus remains in a person's body. Most people who go on to develop chronic hepatitis B do not have symptoms, but it is still very serious and can lead to liver damage (cirrhosis), liver cancer, and death. Chronically-infected people can spread hepatitis B virus to others, even  if they do not feel or look sick themselves. Hepatitis B is spread when blood, semen, or other body fluid infected with the hepatitis B virus enters the body of a person who is not infected. People can become infected through:  Birth (if a mother has hepatitis B, her baby can become infected)  Sharing items such as razors or toothbrushes with an infected person  Contact with the blood or open sores of an infected person  Sex with an infected partner  Sharing needles, syringes, or other drug-injection equipment  Exposure to blood from needlesticks or other sharp instruments Most people who are vaccinated with hepatitis B vaccine are immune for life. 2. Hepatitis B  vaccine Hepatitis B vaccine is usually given as 2, 3, or 4 shots. Infants should get their first dose of hepatitis B vaccine at birth and will usually complete the series at 726 months of age (sometimes it will take longer than 6 months to complete the series). Children and adolescents younger than 68 years of age who have not yet gotten the vaccine should also be vaccinated. Hepatitis B vaccine is also recommended for certain unvaccinated adults:  People whose sex partners have hepatitis B  Sexually active persons who are not in a long-term monogamous relationship  Persons seeking evaluation or treatment for a sexually transmitted disease  Men who have sexual contact with other men  People who share needles, syringes, or other drug-injection equipment  People who have household contact with someone infected with the hepatitis B virus  Health care and public safety workers at risk for exposure to blood or body fluids  Residents and staff of facilities for developmentally disabled persons  Persons in correctional facilities  Victims of sexual assault or abuse  Travelers to regions with increased rates of hepatitis B  People with chronic liver disease, kidney disease, HIV infection, infection with hepatitis C, or diabetes  Anyone who wants to be protected from hepatitis B Hepatitis B vaccine may be given at the same time as other vaccines. 3. Talk with your health care provider Tell your vaccine provider if the person getting the vaccine:  Has had an allergic reaction after a previous dose of hepatitis B vaccine, or has any severe, life-threatening allergies. In some cases, your health care provider may decide to postpone hepatitis B vaccination to a future visit. People with minor illnesses, such as a cold, may be vaccinated. People who are moderately or severely ill should usually wait until they recover before getting hepatitis B vaccine. Your health care provider can give you  more information. 4. Risks of a vaccine reaction  Soreness where the shot is given or fever can happen after hepatitis B vaccine. People sometimes faint after medical procedures, including vaccination. Tell your provider if you feel dizzy or have vision changes or ringing in the ears. As with any medicine, there is a very remote chance of a vaccine causing a severe allergic reaction, other serious injury, or death. 5. What if there is a serious problem? An allergic reaction could occur after the vaccinated person leaves the clinic. If you see signs of a severe allergic reaction (hives, swelling of the face and throat, difficulty breathing, a fast heartbeat, dizziness, or weakness), call 9-1-1 and get the person to the nearest hospital. For other signs that concern you, call your health care provider. Adverse reactions should be reported to the Vaccine Adverse Event Reporting System (VAERS). Your health care provider will usually file this report, or you can  do it yourself. Visit the VAERS website at www.vaers.LAgents.no or call 641 159 1115.VAERS is only for reporting reactions, and VAERS staff do not give medical advice. 6. The National Vaccine Injury Compensation Program The [employer] Vaccine Injury Compensation Program (VICP) is a federal program that was created to compensate people who may have been injured by certain vaccines. Visit the VICP website at SpiritualWord.at or call (703)328-4272 to learn about the program and about filing a claim. There is a time limit to file a claim for compensation. 7. How can I learn more?  Ask your healthcare provider.  Call your local or state health department.  Contact the Centers for Disease Control and Prevention (CDC): ? Call 310 074 3534 (1-800-CDC-INFO) or ? Visit CDC's PicCapture.uy CDC Vaccine Information Statement (Interim) Hepatitis B Vaccine (06/23/2018) This information is not intended to replace advice given to you by  your health care provider. Make sure you discuss any questions you have with your health care provider. Document Revised: 04/14/2019 Document Reviewed: 06/29/2018 Elsevier Patient Education  2020 Elsevier Inc. Influenza (Flu) Vaccine (Inactivated or Recombinant): What You Need to Know 1. Why get vaccinated? Influenza vaccine can prevent influenza (flu). Flu is a contagious disease that spreads around the [place] every year, usually between October and May. Anyone can get the flu, but it is more dangerous for some people. Infants and young children, people 46 years of age and older, pregnant women, and people with certain health conditions or a weakened immune system are at greatest risk of flu complications. Pneumonia, bronchitis, sinus infections and ear infections are examples of flu-related complications. If you have a medical condition, such as heart disease, cancer or diabetes, flu can make it worse. Flu can cause fever and chills, sore throat, muscle aches, fatigue, cough, headache, and runny or stuffy nose. Some people may have vomiting and diarrhea, though this is more common in children than adults. Each year thousands of people in the [place] die from flu, and many more are hospitalized. Flu vaccine prevents millions of illnesses and flu-related visits to the doctor each year. 2. Influenza vaccine CDC recommends everyone 2 months of age and older get vaccinated every flu season. Children 6 months through 33 years of age may need 2 doses during a single flu season. Everyone else needs only 1 dose each flu season. It takes about 2 weeks for protection to develop after vaccination. There are many flu viruses, and they are always changing. Each year a new flu vaccine is made to protect against three or four viruses that are likely to cause disease in the upcoming flu season. Even when the vaccine doesn't exactly match these viruses, it may still provide some protection. Influenza  vaccine does not cause flu. Influenza vaccine may be given at the same time as other vaccines. 3. Talk with your health care provider Tell your vaccine provider if the person getting the vaccine:  Has had an allergic reaction after a previous dose of influenza vaccine, or has any severe, life-threatening allergies.  Has ever had Guillain-Barr Syndrome (also called GBS). In some cases, your health care provider may decide to postpone influenza vaccination to a future visit. People with minor illnesses, such as a cold, may be vaccinated. People who are moderately or severely ill should usually wait until they recover before getting influenza vaccine. Your health care provider can give you more information. 4. Risks of a vaccine reaction  Soreness, redness, and swelling where shot is given, fever, muscle aches, and headache can happen  after influenza vaccine.  There may be a very small increased risk of Guillain-Barr Syndrome (GBS) after inactivated influenza vaccine (the flu shot). Young children who get the flu shot along with pneumococcal vaccine (PCV13), and/or DTaP vaccine at the same time might be slightly more likely to have a seizure caused by fever. Tell your health care provider if a child who is getting flu vaccine has ever had a seizure. People sometimes faint after medical procedures, including vaccination. Tell your provider if you feel dizzy or have vision changes or ringing in the ears. As with any medicine, there is a very remote chance of a vaccine causing a severe allergic reaction, other serious injury, or death. 5. What if there is a serious problem? An allergic reaction could occur after the vaccinated person leaves the clinic. If you see signs of a severe allergic reaction (hives, swelling of the face and throat, difficulty breathing, a fast heartbeat, dizziness, or weakness), call 9-1-1 and get the person to the nearest hospital. For other signs that concern you, call your  health care provider. Adverse reactions should be reported to the Vaccine Adverse Event Reporting System (VAERS). Your health care provider will usually file this report, or you can do it yourself. Visit the VAERS website at www.vaers.LAgents.no or call (260) 152-4552.VAERS is only for reporting reactions, and VAERS staff do not give medical advice. 6. The National Vaccine Injury Compensation Program The [employer] Vaccine Injury Compensation Program (VICP) is a federal program that was created to compensate people who may have been injured by certain vaccines. Visit the VICP website at SpiritualWord.at or call 367-440-3014 to learn about the program and about filing a claim. There is a time limit to file a claim for compensation. 7. How can I learn more?  Ask your healthcare provider.  Call your local or state health department.  Contact the Centers for Disease Control and Prevention (CDC): ? Call 662-428-5579 (1-800-CDC-INFO) or ? Visit CDC's BiotechRoom.com.cy Vaccine Information Statement (Interim) Inactivated Influenza Vaccine (06/23/2018) This information is not intended to replace advice given to you by your health care provider. Make sure you discuss any questions you have with your health care provider. Document Revised: 02/14/2019 Document Reviewed: 06/27/2018 Elsevier Patient Education  2020 [employer].

## 2020-07-12 ENCOUNTER — Telehealth: Payer: Self-pay | Admitting: Family Medicine

## 2020-07-12 DIAGNOSIS — R609 Edema, unspecified: Secondary | ICD-10-CM

## 2020-07-12 DIAGNOSIS — R7989 Other specified abnormal findings of blood chemistry: Secondary | ICD-10-CM

## 2020-07-12 NOTE — Telephone Encounter (Signed)
Called patient to go over results with her. LMOM for her to call back. BNP was elevated suggesting something may be going on with her heart. I want her to keep taking her lasix, but I'm going to get her set up for an [place] of her heart (Echocardiogram) to make sure it's pumping right. They will call her to get that set up sometime in the next week or so. Please give her this information if she calls back.

## 2020-07-16 ENCOUNTER — Other Ambulatory Visit: Payer: Self-pay | Admitting: Family Medicine

## 2020-07-16 DIAGNOSIS — B182 Chronic viral hepatitis C: Secondary | ICD-10-CM

## 2020-07-18 DIAGNOSIS — S42211D Unspecified displaced fracture of surgical neck of right humerus, subsequent encounter for fracture with routine healing: Secondary | ICD-10-CM | POA: Diagnosis not present

## 2020-07-18 LAB — BRAIN NATRIURETIC PEPTIDE: BNP: 109.7 pg/mL — ABNORMAL HIGH (ref 0.0–100.0)

## 2020-07-18 LAB — HEPATITIS C GENOTYPE

## 2020-07-18 LAB — HCV RNA QUANT
HCV log10: 6.942 log10 IU/mL
Hepatitis C Quantitation: 8740000 IU/mL

## 2020-07-23 ENCOUNTER — Other Ambulatory Visit: Payer: Self-pay | Admitting: Family Medicine

## 2020-07-23 DIAGNOSIS — Z1231 Encounter for screening mammogram for malignant neoplasm of breast: Secondary | ICD-10-CM

## 2020-07-26 DIAGNOSIS — S42211D Unspecified displaced fracture of surgical neck of right humerus, subsequent encounter for fracture with routine healing: Secondary | ICD-10-CM | POA: Diagnosis not present

## 2020-07-29 ENCOUNTER — Ambulatory Visit
Admission: RE | Admit: 2020-07-29 | Discharge: 2020-07-29 | Disposition: A | Discharge status: Home / Self Care | Payer: Medicare Other | Source: Ambulatory Visit | Attending: Family Medicine | Admitting: Family Medicine

## 2020-07-29 ENCOUNTER — Other Ambulatory Visit: Payer: Self-pay

## 2020-07-29 DIAGNOSIS — I5030 Unspecified diastolic (congestive) heart failure: Secondary | ICD-10-CM | POA: Diagnosis not present

## 2020-07-29 DIAGNOSIS — R609 Edema, unspecified: Secondary | ICD-10-CM

## 2020-07-29 DIAGNOSIS — R7989 Other specified abnormal findings of blood chemistry: Secondary | ICD-10-CM | POA: Diagnosis not present

## 2020-07-29 DIAGNOSIS — J449 Chronic obstructive pulmonary disease, unspecified: Secondary | ICD-10-CM | POA: Diagnosis not present

## 2020-07-29 LAB — ECHOCARDIOGRAM COMPLETE
AR max vel: 3 cm2
AV Area VTI: 3.09 cm2
AV Area mean vel: 2.88 cm2
AV Mean grad: 4 mmHg
AV Peak grad: 7 mmHg
Ao pk vel: 1.32 m/s
Area-P 1/2: 4.49 cm2
S' Lateral: 1.95 cm

## 2020-07-29 NOTE — Progress Notes (Signed)
*  PRELIMINARY RESULTS* Echocardiogram 2D Echocardiogram has been performed.  Cristela Blue 07/29/2020, 10:31 AM

## 2020-07-29 NOTE — Progress Notes (Signed)
*  PRELIMINARY RESULTS* Echocardiogram 2D Echocardiogram has been performed.  Kim Graham 07/29/2020, 10:30 AM

## 2020-07-30 DIAGNOSIS — M25611 Stiffness of right shoulder, not elsewhere classified: Secondary | ICD-10-CM | POA: Diagnosis not present

## 2020-07-30 DIAGNOSIS — S42211D Unspecified displaced fracture of surgical neck of right humerus, subsequent encounter for fracture with routine healing: Secondary | ICD-10-CM | POA: Diagnosis not present

## 2020-07-31 ENCOUNTER — Other Ambulatory Visit: Payer: Self-pay

## 2020-07-31 ENCOUNTER — Encounter: Payer: Self-pay | Admitting: Family Medicine

## 2020-07-31 ENCOUNTER — Ambulatory Visit (INDEPENDENT_AMBULATORY_CARE_PROVIDER_SITE_OTHER): Payer: Medicare Other | Admitting: Family Medicine

## 2020-07-31 VITALS — BP 121/71 | HR 91 | Temp 99.3°F | Wt 130.0 lb

## 2020-07-31 DIAGNOSIS — F339 Major depressive disorder, recurrent, unspecified: Secondary | ICD-10-CM | POA: Diagnosis not present

## 2020-07-31 DIAGNOSIS — Z1211 Encounter for screening for malignant neoplasm of colon: Secondary | ICD-10-CM

## 2020-07-31 MED ORDER — ESCITALOPRAM OXALATE 5 MG PO TABS: 5.0000 mg | ORAL_TABLET | Freq: Every day | ORAL | 3 refills | Status: DC

## 2020-07-31 NOTE — Progress Notes (Addendum)
BP 121/71   Pulse 91   Temp 99.3 F (37.4 C) (Oral)   Wt 130 lb (59 kg)   SpO2 93%   BMI 21.14 kg/m    Subjective:    Patient ID: Kim Graham, female    DOB: 12/07/1950, 69 y.o.   MRN: [id number]  HPI: Kim Graham is a 69 y.o. female  Chief Complaint  Patient presents with  . Depression    pt states if she can get another cologuard order as the previous one should've been expired   DEPRESSION Mood status: exacerbated Satisfied with current treatment?: no Symptom severity: moderate  Duration of current treatment : chronic Side effects: no Medication compliance: excellent compliance Psychotherapy/counseling: no  Previous psychiatric medications: abilify Depressed mood: yes Anxious mood: yes Anhedonia: no Significant weight loss or gain: no Insomnia: yes hard to fall asleep Fatigue: yes Feelings of worthlessness or guilt: yes Impaired concentration/indecisiveness: no Suicidal ideations: no Hopelessness: yes Crying spells: yes Depression screen [facility] 2/9 07/31/2020 07/02/2020 05/17/2020 06/07/2019 03/27/2019  Decreased Interest 2 3 0 0 0  Down, Depressed, Hopeless 2 2 0 0 0  PHQ - 2 Score 4 5 0 0 0  Altered sleeping 1 2 - 2 1  Tired, decreased energy 2 3 - 2 2  Change in appetite 2 3 - 1 0  Feeling bad or failure about yourself  2 2 - 2 0  Trouble concentrating 2 2 - 1 0  Moving slowly or fidgety/restless 0 0 - 0 0  Suicidal thoughts 0 0 - 0 0  PHQ-9 Score 13 17 - 8 3  Difficult doing work/chores Very difficult Very difficult - - -  Some recent data might be hidden    Relevant past medical, surgical, family and social history reviewed and updated as indicated. Interim medical history since our last visit reviewed. Allergies and medications reviewed and updated.  Review of Systems  Constitutional: Negative.   Respiratory: Negative.   Cardiovascular: Negative.   Gastrointestinal: Negative.   Musculoskeletal: Negative.   Skin: Negative.   Neurological:  Negative.   Psychiatric/Behavioral: Positive for dysphoric mood. Negative for agitation, behavioral problems, confusion, decreased concentration, hallucinations, Germer-injury, sleep disturbance and suicidal ideas. The patient is nervous/anxious. The patient is not hyperactive.     Per HPI unless specifically indicated above     Objective:    BP 121/71   Pulse 91   Temp 99.3 F (37.4 C) (Oral)   Wt 130 lb (59 kg)   SpO2 93%   BMI 21.14 kg/m   Wt Readings from Last 3 Encounters:  07/31/20 130 lb (59 kg)  07/11/20 134 lb (60.8 kg)  07/02/20 125 lb 12.8 oz (57.1 kg)    Physical Exam Vitals and nursing note reviewed.  Constitutional:      General: She is not in acute distress.    Appearance: Normal appearance. She is not ill-appearing, toxic-appearing or diaphoretic.  HENT:     Head: Normocephalic and atraumatic.     Right Ear: External ear normal.     Left Ear: External ear normal.     Nose: Nose normal.     Mouth/Throat:     Mouth: Mucous membranes are moist.     Pharynx: Oropharynx is clear.  Eyes:     General: No scleral icterus.       Right eye: No discharge.        Left eye: No discharge.     Extraocular Movements: Extraocular movements intact.  Conjunctiva/sclera: Conjunctivae normal.     Pupils: Pupils are equal, round, and reactive to light.  Cardiovascular:     Rate and Rhythm: Normal rate and regular rhythm.     Pulses: Normal pulses.     Heart sounds: Normal heart sounds. No murmur heard.  No friction rub. No gallop.   Pulmonary:     Effort: Pulmonary effort is normal. No respiratory distress.     Breath sounds: Normal breath sounds. No stridor. No wheezing, rhonchi or rales.  Chest:     Chest wall: No tenderness.  Musculoskeletal:        General: Normal range of motion.     Cervical back: Normal range of motion and neck supple.  Skin:    General: Skin is warm and dry.     Capillary Refill: Capillary refill takes less than 2 seconds.     Coloration:  Skin is not jaundiced or pale.     Findings: No bruising, erythema, lesion or rash.  Neurological:     General: No focal deficit present.     Mental Status: She is alert and oriented to person, place, and time. Mental status is at baseline.  Psychiatric:        Mood and Affect: Mood normal.        Behavior: Behavior normal.        Thought Content: Thought content normal.        Judgment: Judgment normal.     Results for orders placed or performed during the hospital encounter of 07/29/20  ECHOCARDIOGRAM COMPLETE  Result Value Ref Range   Ao pk vel 1.32 m/s   AV Area VTI 3.09 cm2   AR max vel 3.00 cm2   AV Mean grad 4.0 mmHg   AV Peak grad 7.0 mmHg   S' Lateral 1.95 cm   AV Area mean vel 2.88 cm2   Area-P 1/2 4.49 cm2      Assessment & Plan:   Problem List Items Addressed This Visit      Other   Depression, recurrent (HCC) - Primary    In exacerbation. Continue abilify. Start lexapro. Recheck 1 month. Call with any concerns.       Relevant Medications   escitalopram (LEXAPRO) 5 MG tablet    Other Visit Diagnoses    Screening for colon cancer       Cologuard ordered today.   Relevant Orders   Cologuard       Follow up plan: Return in about 4 weeks (around 08/28/2020).

## 2020-07-31 NOTE — Assessment & Plan Note (Signed)
In exacerbation. Continue abilify. Start lexapro. Recheck 1 month. Call with any concerns.

## 2020-08-07 ENCOUNTER — Other Ambulatory Visit: Payer: Self-pay | Admitting: Family Medicine

## 2020-08-14 ENCOUNTER — Other Ambulatory Visit: Payer: Self-pay

## 2020-08-14 ENCOUNTER — Ambulatory Visit (INDEPENDENT_AMBULATORY_CARE_PROVIDER_SITE_OTHER): Payer: Medicare Other | Admitting: Nurse Practitioner

## 2020-08-14 DIAGNOSIS — Z23 Encounter for immunization: Secondary | ICD-10-CM | POA: Diagnosis not present

## 2020-08-26 DIAGNOSIS — Z1211 Encounter for screening for malignant neoplasm of colon: Secondary | ICD-10-CM | POA: Diagnosis not present

## 2020-08-26 LAB — COLOGUARD: Cologuard: NEGATIVE

## 2020-09-02 ENCOUNTER — Ambulatory Visit: Payer: Medicare Other | Admitting: Family Medicine

## 2020-09-04 LAB — EXTERNAL GENERIC LAB PROCEDURE: COLOGUARD: NEGATIVE

## 2020-09-04 LAB — COLOGUARD: COLOGUARD: NEGATIVE

## 2020-09-16 ENCOUNTER — Ambulatory Visit: Payer: Medicare Other | Admitting: Gastroenterology

## 2020-09-16 NOTE — Progress Notes (Deleted)
Gastroenterology Consultation  Referring Provider:     Dorcas Carrow, DO Primary Care Physician:  Dorcas Carrow, DO Primary Gastroenterologist:  Dr. Servando Snare     Reason for Consultation:     Hepatitis C        HPI:   Kim Graham is a 69 y.o. y/o female referred for consultation & management of hepatitis C by Dr. Laural Benes, Megan P, DO.  This patient comes in today after being found to have hepatitis C antibody positive.  The patient recently had a Cologuard sent off that was negative.  Past Medical History:  Diagnosis Date  . COPD (chronic obstructive pulmonary disease) (HCC)     No past surgical history on file.  Prior to Admission medications   Medication Sig Start Date End Date Taking? Authorizing Provider  albuterol (VENTOLIN HFA) 108 (90 Base) MCG/ACT inhaler Inhale 2 puffs into the lungs every 6 (six) hours as needed for wheezing or shortness of breath. 07/02/20   Johnson, Megan P, DO  ANORO ELLIPTA 62.5-25 MCG/INH AEPB Inhale 1 puff into the lungs daily. 07/02/20   Johnson, Megan P, DO  ARIPiprazole (ABILIFY) 15 MG tablet Take 1 tablet (15 mg total) by mouth daily. 07/02/20   Johnson, Megan P, DO  escitalopram (LEXAPRO) 5 MG tablet Take 1 tablet (5 mg total) by mouth daily. 07/31/20   Johnson, Megan P, DO  ibuprofen (ADVIL) 600 MG tablet Take 1 tablet (600 mg total) by mouth every 8 (eight) hours as needed. 04/26/20   Joni Reining, PA-C  METHADONE HCL PO Take 83 mg by mouth daily.    [provider]    Family History  Problem Relation Age of Onset  . Diabetes Mother   . Cancer Father        Pancreatic  . Diabetes Maternal Aunt   . Diabetes Maternal Grandmother   . Dementia Maternal Grandfather   . Heart disease Maternal Grandfather      Social History   Tobacco Use  . Smoking status: Current Every Day Smoker    Packs/day: 0.75    Years: 49.00    Pack years: 36.75    Last attempt to quit: 12/13/2018    Years since quitting: 1.7  . Smokeless tobacco:  Never Used  Vaping Use  . Vaping Use: Never used  Substance Use Topics  . Alcohol use: Not Currently    Comment: On occasion  . Drug use: No    Allergies as of 09/16/2020  . (No Known Allergies)    Review of Systems:    All systems reviewed and negative except where noted in HPI.   Physical Exam:  There were no vitals taken for this visit. No LMP recorded. Patient is postmenopausal. General:   Alert,  Well-developed, ***well-nourished, pleasant and cooperative in NAD Head:  Normocephalic and atraumatic. Eyes:  Sclera clear, no icterus.   Conjunctiva pink. Ears:  Normal auditory acuity. Neck:  Supple; no masses or thyromegaly. Lungs:  Respirations even and unlabored.  Clear throughout to auscultation.   No wheezes, crackles, or rhonchi. No acute distress. Heart:  Regular rate and rhythm; no murmurs, clicks, rubs, or gallops. Abdomen:  Normal bowel sounds.  No bruits.  Soft, non-tender and non-distended without masses, hepatosplenomegaly or hernias noted.  No guarding or rebound tenderness.  ***Negative Carnett sign.   Rectal:  Deferred.***  Pulses:  Normal pulses noted. Extremities:  No clubbing or edema.  No cyanosis. Neurologic:  Alert and oriented x3;  grossly normal neurologically. Skin:  Intact without significant lesions or rashes.  ***No jaundice. Lymph Nodes:  No significant cervical adenopathy. Psych:  Alert and cooperative. Normal mood and affect.  Imaging Studies: No results found.  Assessment and Plan:   Kim Graham is a 69 y.o. y/o female ***    [name], MD. [name]    Note: This dictation was prepared with Dragon dictation along with smaller phrase technology. Any transcriptional errors that result from this process are unintentional.

## 2020-10-15 ENCOUNTER — Telehealth: Payer: Self-pay | Admitting: Family Medicine

## 2020-10-16 ENCOUNTER — Ambulatory Visit (INDEPENDENT_AMBULATORY_CARE_PROVIDER_SITE_OTHER): Payer: Medicare Other | Admitting: Gastroenterology

## 2020-10-16 ENCOUNTER — Encounter: Payer: Self-pay | Admitting: Gastroenterology

## 2020-10-16 ENCOUNTER — Other Ambulatory Visit: Payer: Self-pay

## 2020-10-16 VITALS — BP 135/84 | HR 99 | Ht 65.0 in | Wt 130.4 lb

## 2020-10-16 DIAGNOSIS — B182 Chronic viral hepatitis C: Secondary | ICD-10-CM | POA: Diagnosis not present

## 2020-10-16 NOTE — Progress Notes (Signed)
Gastroenterology Consultation  Referring Provider:     Dorcas Carrow, DO Primary Care Physician:  Kim Carrow, DO Primary Gastroenterologist:  Dr. Servando Graham     Reason for Consultation:     Hepatitis C        HPI:   Kim Graham is a 69 y.o. y/o female referred for consultation & management of hepatitis C by Dr. Laural Graham, Kim P, DO.  This patient comes in today after being found to have a hepatitis C antibody positive with a positive viral load and a genotype of 1A.  The patient's viral load was 8,700,000 international units/mL.  The patient's hepatitis B surface antibody was inconsistent with immunity.  The patient has a history of depression and recently had a negative Cologuard test for colon cancer screening. She was vaccinated for Heb B recently. Her liver enzymes showed:  Component     Latest Ref Rng & Units 09/19/2018 03/27/2019 07/02/2020           2:21 PM  Alkaline Phosphatase     48 - 121 IU/L 110 115 153 (H)  AST     0 - 40 IU/L 59 (H) 45 (H) 96 (H)  ALT     0 - 32 IU/L 59 (H) 38 (H) 65 (H)    The patient reports that she has a history of IV drug use, homemade tattoos and snorting cocaine.  She also reports high risk sexual activity with an ex-boyfriend.  Past Medical History:  Diagnosis Date  . COPD (chronic obstructive pulmonary disease) (HCC)     No past surgical history on file.  Prior to Admission medications   Medication Sig Start Date End Date Taking? Authorizing Provider  albuterol (VENTOLIN HFA) 108 (90 Base) MCG/ACT inhaler Inhale 2 puffs into the lungs every 6 (six) hours as needed for wheezing or shortness of breath. 07/02/20   Graham, Kim P, DO  ANORO ELLIPTA 62.5-25 MCG/INH AEPB Inhale 1 puff into the lungs daily. 07/02/20   Graham, Kim P, DO  ARIPiprazole (ABILIFY) 15 MG tablet Take 1 tablet (15 mg total) by mouth daily. 07/02/20   Graham, Kim P, DO  escitalopram (LEXAPRO) 5 MG tablet Take 1 tablet (5 mg total) by mouth daily. 07/31/20    Graham, Kim P, DO  ibuprofen (ADVIL) 600 MG tablet Take 1 tablet (600 mg total) by mouth every 8 (eight) hours as needed. 04/26/20   Kim Reining, PA-C  METHADONE HCL PO Take 83 mg by mouth daily.    [provider]    Family History  Problem Relation Age of Onset  . Diabetes Mother   . Cancer Father        Pancreatic  . Diabetes Maternal Aunt   . Diabetes Maternal Grandmother   . Dementia Maternal Grandfather   . Heart disease Maternal Grandfather      Social History   Tobacco Use  . Smoking status: Current Every Day Smoker    Packs/day: 0.75    Years: 49.00    Pack years: 36.75    Last attempt to quit: 12/13/2018    Years since quitting: 1.8  . Smokeless tobacco: Never Used  Vaping Use  . Vaping Use: Never used  Substance Use Topics  . Alcohol use: Not Currently    Comment: On occasion  . Drug use: No    Allergies as of 10/16/2020  . (No Known Allergies)    Review of Systems:    All systems reviewed and  negative except where noted in HPI.   Physical Exam:  There were no vitals taken for this visit. No LMP recorded. Patient is postmenopausal. General:   Alert,  Well-developed, well-nourished, pleasant and cooperative in NAD Head:  Normocephalic and atraumatic. Eyes:  Sclera clear, no icterus.   Conjunctiva pink. Ears:  Normal auditory acuity. Neck:  Supple; no masses or thyromegaly. Lungs:  Respirations even and unlabored.  Clear throughout to auscultation.   No wheezes, crackles, or rhonchi. No acute distress. Heart:  Regular rate and rhythm; no murmurs, clicks, rubs, or gallops. Abdomen:  Normal bowel sounds.  No bruits.  Soft, non-tender and non-distended without masses, hepatosplenomegaly or hernias noted.  No guarding or rebound tenderness.  Negative Carnett sign.   Rectal:  Deferred.  Pulses:  Normal pulses noted. Extremities:  No clubbing or edema.  No cyanosis. Neurologic:  Alert and oriented x3;  grossly normal neurologically. Skin:   Intact without significant lesions or rashes.  No jaundice. Lymph Nodes:  No significant cervical adenopathy. Psych:  Alert and cooperative. Normal mood and affect.  Imaging Studies: No results found.  Assessment and Plan:   Kim Graham is a 69 y.o. y/o female who comes in today with a finding of hepatitis C antibody positive with a genotype of 1A and a viral load that was positive.  The patient will have her lab sent off for hepatitis a antibody since she has already been vaccinated for hepatitis B.  She will also have her fibrosis score evaluated and be treated accordingly when her labs come back.  The patient will also have lab sent off for other possible causes of abnormal liver enzymes.  The patient has been explained the plan agrees with it.    [name], MD. [name]    Note: This dictation was prepared with Dragon dictation along with smaller phrase technology. Any transcriptional errors that result from this process are unintentional.

## 2020-10-16 NOTE — Telephone Encounter (Signed)
Contacted patient to set up appointment time to have DEXA scan by [facility] nurse in office.  Patient stated that she received a call from [facility] and a nurse is scheduled to come to come to her home to complete the testing on 10/21/20.

## 2020-10-18 LAB — HCV FIBROSURE
ALPHA 2-MACROGLOBULINS, QN: 386 mg/dL — ABNORMAL HIGH (ref 110–276)
ALT (SGPT) P5P: 128 IU/L — ABNORMAL HIGH (ref 0–40)
Apolipoprotein A-1: 177 mg/dL (ref 116–209)
Bilirubin, Total: 0.3 mg/dL (ref 0.0–1.2)
Fibrosis Score: 0.65 — ABNORMAL HIGH (ref 0.00–0.21)
GGT: 179 IU/L — ABNORMAL HIGH (ref 0–60)
Haptoglobin: 77 mg/dL (ref 37–355)
Necroinflammat Activity Score: 0.77 — ABNORMAL HIGH (ref 0.00–0.17)

## 2020-10-18 LAB — HEPATITIS A ANTIBODY, TOTAL: hep A Total Ab: POSITIVE — AB

## 2020-10-18 LAB — MITOCHONDRIAL ANTIBODIES: Mitochondrial Ab: <20 Units (ref 0.0–20.0)

## 2020-10-18 LAB — HEPATIC FUNCTION PANEL
ALT: 120 IU/L — ABNORMAL HIGH (ref 0–32)
AST: 111 IU/L — ABNORMAL HIGH (ref 0–40)
Albumin: 3.7 g/dL — ABNORMAL LOW (ref 3.8–4.8)
Alkaline Phosphatase: 190 IU/L — ABNORMAL HIGH (ref 44–121)
Bilirubin Total: 0.5 mg/dL (ref 0.0–1.2)
Bilirubin, Direct: 0.35 mg/dL (ref 0.00–0.40)
Total Protein: 6.5 g/dL (ref 6.0–8.5)

## 2020-10-18 LAB — IRON AND TIBC
Iron Saturation: 23 % (ref 15–55)
Iron: 82 ug/dL (ref 27–139)
Total Iron Binding Capacity: 356 ug/dL (ref 250–450)
UIBC: 274 ug/dL (ref 118–369)

## 2020-10-18 LAB — FERRITIN: Ferritin: 170 ng/mL — ABNORMAL HIGH (ref 15–150)

## 2020-10-18 LAB — ALPHA-1-ANTITRYPSIN: A-1 Antitrypsin: 166 mg/dL (ref 101–187)

## 2020-10-18 LAB — CERULOPLASMIN: Ceruloplasmin: 25.3 mg/dL (ref 19.0–39.0)

## 2020-10-18 LAB — ANA: Anti Nuclear Antibody (ANA): POSITIVE — AB

## 2020-10-18 LAB — ANTI-SMOOTH MUSCLE ANTIBODY, IGG: Smooth Muscle Ab: 11 Units (ref 0–19)

## 2020-10-22 ENCOUNTER — Other Ambulatory Visit: Payer: Self-pay | Admitting: Family Medicine

## 2020-10-22 ENCOUNTER — Telehealth: Payer: Self-pay

## 2020-10-22 NOTE — Telephone Encounter (Signed)
Pt stated she did need a refill on albuterol (VENTOLIN HFA) 108 (90 Base) MCG/ACT inhaler Pt asked for call when refills are sent to pharmacy she stated she has to use it every day and is completley out

## 2020-10-22 NOTE — Telephone Encounter (Signed)
Medication already sent to pharmacy

## 2020-10-28 ENCOUNTER — Telehealth: Payer: Self-pay

## 2020-10-28 NOTE — Telephone Encounter (Signed)
-----   Message from [name], MD sent at 10/22/2020  4:00 PM EST ----- Let the patient know that her liver enzymes are higher than before and her fibrosis score showed her to have very advanced fibrosis which can also indicate early cirrhosis.  The patient should be treated for her hepatitis C as soon as possible avoid any further damage.  The patient is also immune to hepatitis A.

## 2020-10-28 NOTE — Telephone Encounter (Signed)
Left vm for pt to return my call regarding lab results and Hep C medication referral sent to [facility].

## 2020-11-19 LAB — HM DIABETES EYE EXAM

## 2020-12-02 ENCOUNTER — Other Ambulatory Visit: Payer: Self-pay | Admitting: Family Medicine

## 2020-12-17 IMAGING — DX DG CHEST 2V
2 series · 2 of 2 positions shown · non-contrast
Comparison: None.

CLINICAL DATA: Shortness of breath with productive cough 2 weeks.
Former smoker.

EXAM:
CHEST - 2 VIEW

[chest pa]
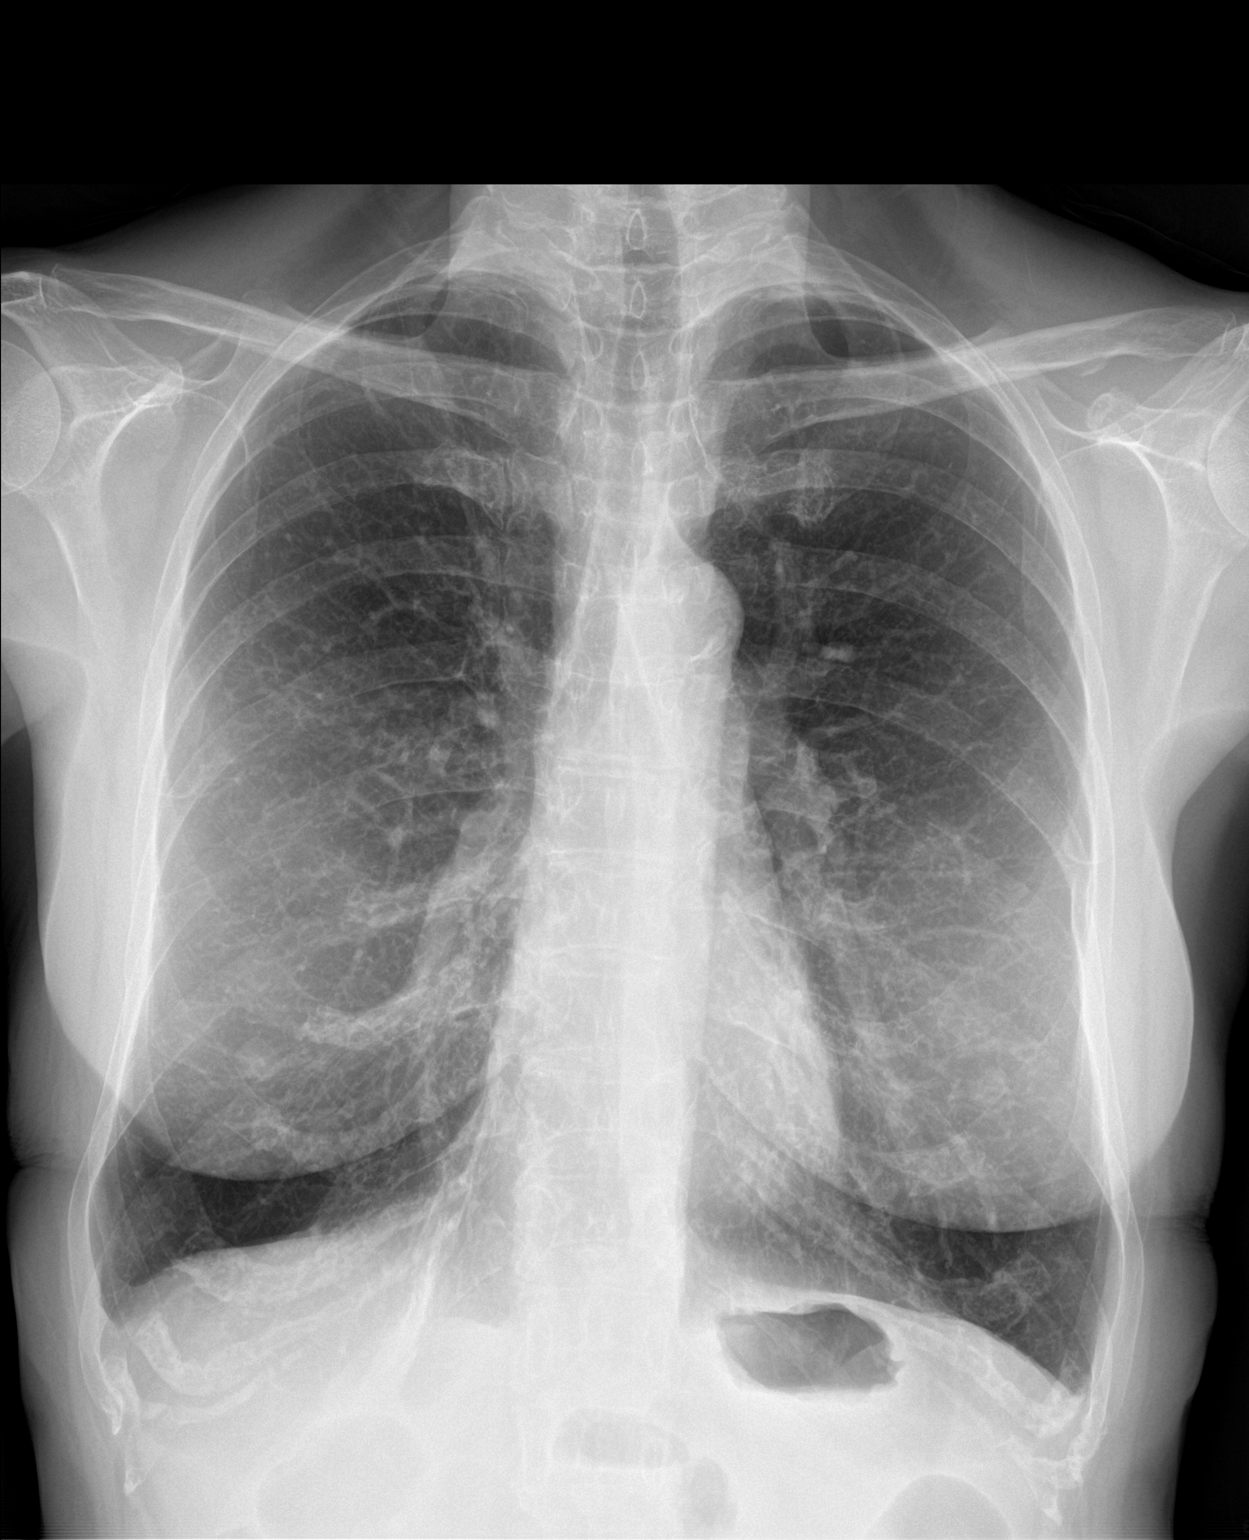

[chest lat]
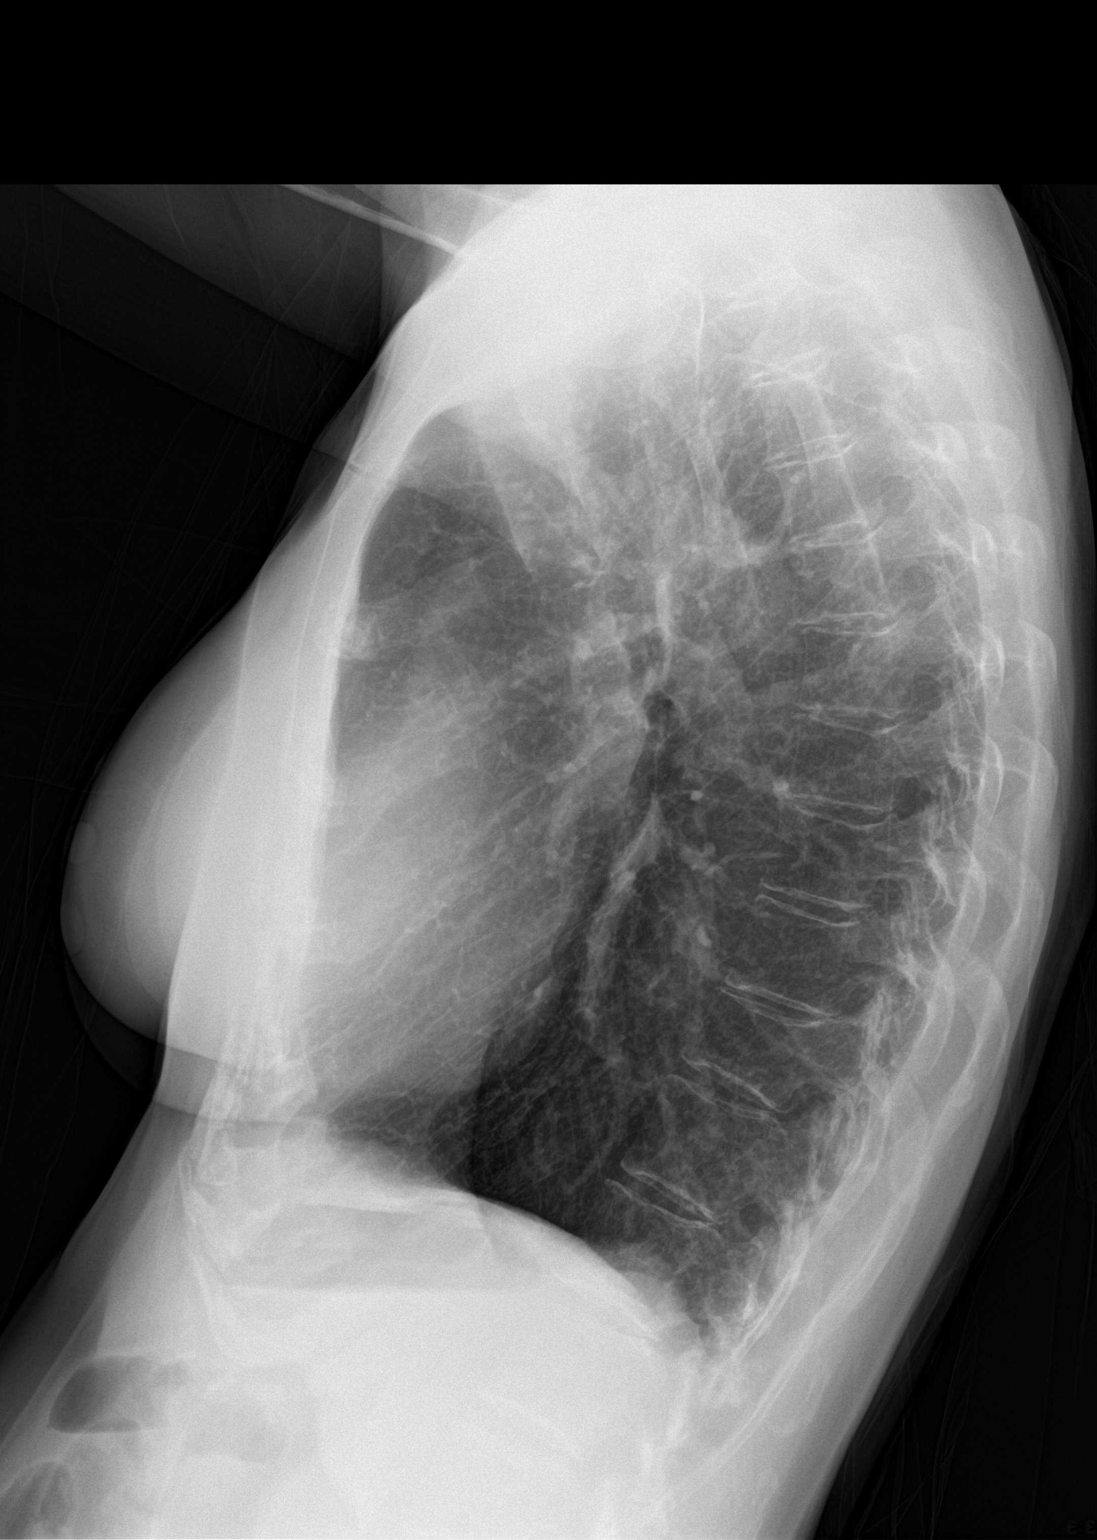

[2 of 2 positions shown; findings below may reference images not displayed]

FINDINGS: Lungs are hyperexpanded with mild flattening of the hemidiaphragms.
No focal lobar consolidation or effusion. Cardiomediastinal
silhouette is within normal. Mild degenerative change of the spine.
IMPRESSION: No acute cardiopulmonary disease.

Suggestion of COPD.

## 2021-01-02 ENCOUNTER — Other Ambulatory Visit: Payer: Self-pay

## 2021-01-02 DIAGNOSIS — B182 Chronic viral hepatitis C: Secondary | ICD-10-CM

## 2021-01-22 ENCOUNTER — Ambulatory Visit: Payer: Medicare Other | Admitting: Gastroenterology

## 2021-02-20 ENCOUNTER — Other Ambulatory Visit: Payer: Self-pay | Admitting: Family Medicine

## 2021-03-04 ENCOUNTER — Encounter: Payer: Self-pay | Admitting: Family Medicine

## 2021-03-04 ENCOUNTER — Ambulatory Visit (INDEPENDENT_AMBULATORY_CARE_PROVIDER_SITE_OTHER): Payer: 59 | Admitting: Family Medicine

## 2021-03-04 ENCOUNTER — Other Ambulatory Visit: Payer: Self-pay

## 2021-03-04 VITALS — BP 150/69 | HR 105 | Wt 131.8 lb

## 2021-03-04 DIAGNOSIS — B182 Chronic viral hepatitis C: Secondary | ICD-10-CM | POA: Diagnosis not present

## 2021-03-04 DIAGNOSIS — J441 Chronic obstructive pulmonary disease with (acute) exacerbation: Secondary | ICD-10-CM | POA: Diagnosis not present

## 2021-03-04 DIAGNOSIS — E119 Type 2 diabetes mellitus without complications: Secondary | ICD-10-CM | POA: Diagnosis not present

## 2021-03-04 DIAGNOSIS — F339 Major depressive disorder, recurrent, unspecified: Secondary | ICD-10-CM | POA: Diagnosis not present

## 2021-03-04 LAB — BAYER DCA HB A1C WAIVED: HB A1C (BAYER DCA - WAIVED): 5.6 % (ref <7.0)

## 2021-03-04 MED ORDER — ARIPIPRAZOLE 20 MG PO TABS: 20.0000 mg | ORAL_TABLET | Freq: Every day | ORAL | 1 refills | Status: DC

## 2021-03-04 MED ORDER — ALBUTEROL SULFATE HFA 108 (90 BASE) MCG/ACT IN AERS: 2.0000 | INHALATION_SPRAY | Freq: Four times a day (QID) | RESPIRATORY_TRACT | 0 refills | Status: DC | PRN

## 2021-03-04 MED ORDER — ANORO ELLIPTA 62.5-25 MCG/INH IN AEPB: 1.0000 | INHALATION_SPRAY | Freq: Every day | RESPIRATORY_TRACT | 3 refills | Status: DC

## 2021-03-04 NOTE — Assessment & Plan Note (Signed)
Under good control on current regimen. Continue current regimen. Continue to monitor. Call with any concerns. Refills given.   

## 2021-03-04 NOTE — Progress Notes (Signed)
BP (!) 150/69   Pulse (!) 105   Wt 131 lb 12.8 oz (59.8 kg)   BMI 21.93 kg/m    Subjective:    Patient ID: Kim Graham, female    DOB: 04-14-1951, 70 y.o.   MRN: [id number]  HPI: Kim Graham is a 70 y.o. female  Chief Complaint  Patient presents with  . Diabetes  . Depression   HEPATITIS C- finished her hep c medicine back in the winter Duration since diagnosis: years Hep C transmission: IV drug use Genotype:  Viral load:   Hepatology evaluation:yes Liver biopsy:no  Cirrhosis: yes Antiviral therapy:yes Hepatocellular carcinoma screening: yes Esophageal varices screening/EGD: no Hepatitis A Vaccine: Up to Date Hepatitis B Vaccine: Up to Date Pneumovax Vaccine: Up to Date  DEPRESSION Mood status: uncontrolled Satisfied with current treatment?: no Symptom severity: moderate  Duration of current treatment : chronic Side effects: no Medication compliance: excellent compliance Psychotherapy/counseling: no  Previous psychiatric medications: abilify Depressed mood: yes Anxious mood: yes Anhedonia: no Significant weight loss or gain: no Insomnia: no  Fatigue: yes Feelings of worthlessness or guilt: no Impaired concentration/indecisiveness: no Suicidal ideations: no Hopelessness: no Crying spells: no Depression screen [facility] 2/9 03/04/2021 07/31/2020 07/02/2020 05/17/2020 06/07/2019  Decreased Interest 1 2 3  0 0  Down, Depressed, Hopeless 1 2 2  0 0  PHQ - 2 Score 2 4 5  0 0  Altered sleeping 2 1 2  - 2  Tired, decreased energy 2 2 3  - 2  Change in appetite 0 2 3 - 1  Feeling bad or failure about yourself  1 2 2  - 2  Trouble concentrating 1 2 2  - 1  Moving slowly or fidgety/restless 0 0 0 - 0  Suicidal thoughts 0 0 0 - 0  PHQ-9 Score 8 13 17  - 8  Difficult doing work/chores Somewhat difficult Very difficult Very difficult - -  Some recent data might be hidden   DIABETES Hypoglycemic episodes:no Polydipsia/polyuria: no Visual disturbance: no Chest pain:  no Paresthesias: no Glucose Monitoring: no Taking Insulin?: no Blood Pressure Monitoring: not checking Retinal Examination: Up to Date Foot Exam: Up to Date Diabetic Education: Completed Pneumovax: unknown Influenza: Up to Date Aspirin: yes  Relevant past medical, surgical, family and social history reviewed and updated as indicated. Interim medical history since our last visit reviewed. Allergies and medications reviewed and updated.  Review of Systems  Constitutional: Negative.   Respiratory: Negative.   Cardiovascular: Negative.   Gastrointestinal: Negative.   Musculoskeletal: Negative.   Psychiatric/Behavioral: Positive for dysphoric mood. Negative for agitation, behavioral problems, confusion, decreased concentration, hallucinations, [place]-injury, sleep disturbance and suicidal ideas. The patient is not nervous/anxious and is not hyperactive.     Per HPI unless specifically indicated above     Objective:    BP (!) 150/69   Pulse (!) 105   Wt 131 lb 12.8 oz (59.8 kg)   BMI 21.93 kg/m   Wt Readings from Last 3 Encounters:  03/04/21 131 lb 12.8 oz (59.8 kg)  10/16/20 130 lb 6.4 oz (59.1 kg)  07/31/20 130 lb (59 kg)    Physical Exam Vitals and nursing note reviewed.  Constitutional:      General: She is not in acute distress.    Appearance: Normal appearance. She is not ill-appearing, toxic-appearing or diaphoretic.  HENT:     Head: Normocephalic and atraumatic.     Right Ear: External ear normal.     Left Ear: External ear normal.  Nose: Nose normal.     Mouth/Throat:     Mouth: Mucous membranes are moist.     Pharynx: Oropharynx is clear.  Eyes:     General: No scleral icterus.       Right eye: No discharge.        Left eye: No discharge.     Extraocular Movements: Extraocular movements intact.     Conjunctiva/sclera: Conjunctivae normal.     Pupils: Pupils are equal, round, and reactive to light.  Cardiovascular:     Rate and Rhythm: Normal rate and  regular rhythm.     Pulses: Normal pulses.     Heart sounds: Normal heart sounds. No murmur heard. No friction rub. No gallop.   Pulmonary:     Effort: Pulmonary effort is normal. No respiratory distress.     Breath sounds: Normal breath sounds. No stridor. No wheezing, rhonchi or rales.  Chest:     Chest wall: No tenderness.  Musculoskeletal:        General: Normal range of motion.     Cervical back: Normal range of motion and neck supple.  Skin:    General: Skin is warm and dry.     Capillary Refill: Capillary refill takes less than 2 seconds.     Coloration: Skin is not jaundiced or pale.     Findings: No bruising, erythema, lesion or rash.  Neurological:     General: No focal deficit present.     Mental Status: She is alert and oriented to person, place, and time. Mental status is at baseline.  Psychiatric:        Mood and Affect: Mood normal.        Behavior: Behavior normal.        Thought Content: Thought content normal.        Judgment: Judgment normal.     Results for orders placed or performed in visit on 11/20/20  HM DIABETES EYE EXAM  Result Value Ref Range   HM Diabetic Eye Exam No Retinopathy No Retinopathy      Assessment & Plan:   Problem List Items Addressed This Visit      Respiratory   Chronic obstructive pulmonary disease (HCC)    Under good control on current regimen. Continue current regimen. Continue to monitor. Call with any concerns. Refills given.        Relevant Medications   ANORO ELLIPTA 62.5-25 MCG/INH AEPB   albuterol (VENTOLIN HFA) 108 (90 Base) MCG/ACT inhaler     Digestive   Hep C w/o coma, chronic (HCC)    Has completed treatment, but never had her labs rechecked- ordered today. Await results. Follow up with GI as needed.       Relevant Orders   HCV RNA quant     Endocrine   RESOLVED: Diabetes mellitus type 2, diet-controlled (HCC) - Primary    Under great control with A1c of 5.6- will resolve off problem list and add to  history.      Relevant Orders   Bayer DCA Hb A1c Waived   Comprehensive metabolic panel   Lipid Panel w/o Chol/HDL Ratio     Other   Depression, recurrent (HCC)    Not under good control. Will increase her abilify to 20mg . Call with any concerns. Continue to monitor.           Follow up plan: Return in about 6 months (around 09/03/2021) for physical.

## 2021-03-04 NOTE — Assessment & Plan Note (Signed)
Has completed treatment, but never had her labs rechecked- ordered today. Await results. Follow up with GI as needed.

## 2021-03-04 NOTE — Assessment & Plan Note (Signed)
Under great control with A1c of 5.6- will resolve off problem list and add to history.

## 2021-03-04 NOTE — Patient Instructions (Addendum)
Call to schedule your mammogram and bone density [facility]  Address: [address], [place], [place] [address]  Phone: [phone]  

## 2021-03-04 NOTE — Assessment & Plan Note (Signed)
Not under good control. Will increase her abilify to 20mg . Call with any concerns. Continue to monitor.

## 2021-03-05 LAB — LIPID PANEL W/O CHOL/HDL RATIO
Cholesterol, Total: 170 mg/dL (ref 100–199)
HDL: 71 mg/dL (ref >39)
LDL Chol Calc (NIH): 81 mg/dL (ref 0–99)
Triglycerides: 101 mg/dL (ref 0–149)
VLDL Cholesterol Cal: 18 mg/dL (ref 5–40)

## 2021-03-05 LAB — COMPREHENSIVE METABOLIC PANEL
ALT: 11 IU/L (ref 0–32)
AST: 21 IU/L (ref 0–40)
Albumin/Globulin Ratio: 1.6 (ref 1.2–2.2)
Albumin: 4.4 g/dL (ref 3.8–4.8)
Alkaline Phosphatase: 115 IU/L (ref 44–121)
BUN/Creatinine Ratio: 13 (ref 12–28)
BUN: 12 mg/dL (ref 8–27)
Bilirubin Total: 0.4 mg/dL (ref 0.0–1.2)
CO2: 25 mmol/L (ref 20–29)
Calcium: 9.6 mg/dL (ref 8.7–10.3)
Chloride: 101 mmol/L (ref 96–106)
Creatinine, Ser: 0.89 mg/dL (ref 0.57–1.00)
Globulin, Total: 2.7 g/dL (ref 1.5–4.5)
Glucose: 113 mg/dL — ABNORMAL HIGH (ref 65–99)
Potassium: 3.9 mmol/L (ref 3.5–5.2)
Sodium: 147 mmol/L — ABNORMAL HIGH (ref 134–144)
Total Protein: 7.1 g/dL (ref 6.0–8.5)
eGFR: 70 mL/min/{1.73_m2} (ref >59)

## 2021-03-05 LAB — HCV RNA QUANT: Hepatitis C Quantitation: NOT DETECTED IU/mL

## 2021-03-07 ENCOUNTER — Encounter: Payer: Self-pay | Admitting: Family Medicine

## 2021-03-12 ENCOUNTER — Telehealth: Payer: Self-pay

## 2021-03-12 NOTE — Telephone Encounter (Signed)
Questions answered, patient verbalized understanding and advised patient to give our office a call if she has any other questions or concerns.

## 2021-03-12 NOTE — Telephone Encounter (Signed)
Copied from CRM [id number]. Topic: General - Other >> Mar 12, 2021  3:53 PM Gwenlyn Fudge wrote: Reason for CRM: Pt called and is requesting to have a call back to discuss her recent lab results. Please advise.

## 2021-03-31 ENCOUNTER — Telehealth: Payer: Self-pay | Admitting: Family Medicine

## 2021-04-01 ENCOUNTER — Telehealth: Payer: Self-pay

## 2021-04-01 NOTE — Telephone Encounter (Signed)
Called pt to schedule next appt "Return in about 6 months (around 09/03/2021) for physical." no answer left vm   Copied from CRM #[id number]. Topic: General - Other >> Apr 01, 2021  9:51 AM Kim Graham A wrote: Reason for CRM: Patient would like their prescription for Premarin submitted to their preferred pharmacy when possible   Patient shares that they have previously been prescribed the medication and receive a yearly supply   Please contact to advise further if needed

## 2021-04-02 NOTE — Telephone Encounter (Signed)
Patient is calling again to ask why her request for medication has been discontinued.  Please respond to patient to explain at [phone]

## 2021-04-03 NOTE — Telephone Encounter (Signed)
Patient was notified she will need an appointment prior to refill on medication. Patient is added to Dr.Johnson schedule for 04/04/21 at 3:20pm. Patient verbalized understanding and has no further questions.

## 2021-04-04 ENCOUNTER — Other Ambulatory Visit: Payer: Self-pay

## 2021-04-04 ENCOUNTER — Encounter: Payer: Self-pay | Admitting: Family Medicine

## 2021-04-04 ENCOUNTER — Telehealth: Payer: Self-pay

## 2021-04-04 ENCOUNTER — Ambulatory Visit (INDEPENDENT_AMBULATORY_CARE_PROVIDER_SITE_OTHER): Payer: 59 | Admitting: Family Medicine

## 2021-04-04 VITALS — BP 137/74 | HR 102 | Temp 97.9°F | Wt 133.6 lb

## 2021-04-04 DIAGNOSIS — F339 Major depressive disorder, recurrent, unspecified: Secondary | ICD-10-CM | POA: Diagnosis not present

## 2021-04-04 DIAGNOSIS — N952 Postmenopausal atrophic vaginitis: Secondary | ICD-10-CM | POA: Diagnosis not present

## 2021-04-04 DIAGNOSIS — F1120 Opioid dependence, uncomplicated: Secondary | ICD-10-CM | POA: Diagnosis not present

## 2021-04-04 MED ORDER — PREMARIN 0.625 MG/GM VA CREA: 1.0000 | TOPICAL_CREAM | Freq: Every day | VAGINAL | 11 refills | Status: DC | PRN

## 2021-04-04 MED ORDER — PREMARIN 0.625 MG/GM VA CREA: 0.5000 g | TOPICAL_CREAM | Freq: Every day | VAGINAL | 11 refills | Status: DC

## 2021-04-04 NOTE — Assessment & Plan Note (Signed)
Stable. Continue to monitor. Call with any concerns.  ?

## 2021-04-04 NOTE — Addendum Note (Signed)
Addended by: Dorcas Carrow on: 04/04/2021 04:11 PM   Modules accepted: Orders

## 2021-04-04 NOTE — Telephone Encounter (Signed)
error 

## 2021-04-04 NOTE — Assessment & Plan Note (Signed)
Under good control on current regimen. Continue current regimen. Continue to monitor. Call with any concerns. Refills up to date. Continue to monitor.   

## 2021-04-04 NOTE — Progress Notes (Signed)
BP 137/74   Pulse (!) 102   Temp 97.9 F (36.6 C)   Wt 133 lb 9.6 oz (60.6 kg)   SpO2 92%   BMI 22.23 kg/m    Subjective:    Patient ID: Kim Graham, female    DOB: Nov 27, 1950, 70 y.o.   MRN: [id number]  HPI: Kim Graham is a 70 y.o. female  Chief Complaint  Patient presents with  . Medication Refill    Patient would like refill on premarin cream   . Depression  . Hypertension   MENOPAUSAL SYMPTOMS Duration: chronic Status: uncontrolled Symptom severity: mild Hot flashes: no Night sweats: no Sleep disturbances: no Vaginal dryness: yes Dyspareunia:yes Decreased libido: no Emotional lability: no Stress incontinence: no Previous HRT/pharmacotherapy: yes Hysterectomy: no Absolute Contraindications to Hormonal Therapy:     Undiagnosed vaginal bleeding: no    Breast cancer: no    Endometrial cancer: no    Coronary disease: no    Cerebrovascular disease: no    Venous thromboembolic disease: no  HYPERTENSION Hypertension status: controlled  Satisfied with current treatment? yes Duration of hypertension: chronic BP monitoring frequency:  not checking BP range:  BP medication side effects:  no Medication compliance: excellent compliance Previous BP meds:none Aspirin: no Recurrent headaches: no Visual changes: no Palpitations: no Dyspnea: no Chest pain: no Lower extremity edema: no Dizzy/lightheaded: no  DEPRESSION Mood status: better Satisfied with current treatment?: yes Symptom severity: mild  Duration of current treatment : chronic Side effects: no Medication compliance: excellent compliance Psychotherapy/counseling: no  Previous psychiatric medications: abilify Depressed mood: yes Anxious mood: yes Anhedonia: no Significant weight loss or gain: no Insomnia: no  Fatigue: yes Feelings of worthlessness or guilt: no Impaired concentration/indecisiveness: no Suicidal ideations: no Hopelessness: no Crying spells: no Depression screen [facility]  2/9 04/04/2021 03/04/2021 07/31/2020 07/02/2020 05/17/2020  Decreased Interest 0 1 2 3  0  Down, Depressed, Hopeless 1 1 2 2  0  PHQ - 2 Score 1 2 4 5  0  Altered sleeping 1 2 1 2  -  Tired, decreased energy 1 2 2 3  -  Change in appetite 1 0 2 3 -  Feeling bad or failure about yourself  0 1 2 2  -  Trouble concentrating 0 1 2 2  -  Moving slowly or fidgety/restless 0 0 0 0 -  Suicidal thoughts 0 0 0 0 -  PHQ-9 Score 4 8 13 17  -  Difficult doing work/chores Not difficult at all Somewhat difficult Very difficult Very difficult -  Some recent data might be hidden   Relevant past medical, surgical, family and social history reviewed and updated as indicated. Interim medical history since our last visit reviewed. Allergies and medications reviewed and updated.  Review of Systems  Constitutional: Negative.   Respiratory: Negative.   Cardiovascular: Negative.   Gastrointestinal: Negative.   Genitourinary: Positive for vaginal pain. Negative for decreased urine volume, difficulty urinating, dyspareunia, dysuria, enuresis, flank pain, frequency, genital sores, hematuria, menstrual problem, pelvic pain, urgency, vaginal bleeding and vaginal discharge.  Musculoskeletal: Negative.   Skin: Negative.   Neurological: Negative.   Psychiatric/Behavioral: Negative.     Per HPI unless specifically indicated above     Objective:    BP 137/74   Pulse (!) 102   Temp 97.9 F (36.6 C)   Wt 133 lb 9.6 oz (60.6 kg)   SpO2 92%   BMI 22.23 kg/m   Wt Readings from Last 3 Encounters:  04/04/21 133 lb 9.6 oz (60.6 kg)  03/04/21 131 lb 12.8 oz (59.8 kg)  10/16/20 130 lb 6.4 oz (59.1 kg)    Physical Exam Vitals and nursing note reviewed.  Constitutional:      General: She is not in acute distress.    Appearance: Normal appearance. She is not ill-appearing, toxic-appearing or diaphoretic.  HENT:     Head: Normocephalic and atraumatic.     Right Ear: External ear normal.     Left Ear: External ear normal.      Nose: Nose normal.     Mouth/Throat:     Mouth: Mucous membranes are moist.     Pharynx: Oropharynx is clear.  Eyes:     General: No scleral icterus.       Right eye: No discharge.        Left eye: No discharge.     Extraocular Movements: Extraocular movements intact.     Conjunctiva/sclera: Conjunctivae normal.     Pupils: Pupils are equal, round, and reactive to light.  Cardiovascular:     Rate and Rhythm: Normal rate and regular rhythm.     Pulses: Normal pulses.     Heart sounds: Normal heart sounds. No murmur heard. No friction rub. No gallop.   Pulmonary:     Effort: Pulmonary effort is normal. No respiratory distress.     Breath sounds: Normal breath sounds. No stridor. No wheezing, rhonchi or rales.  Chest:     Chest wall: No tenderness.  Musculoskeletal:        General: Normal range of motion.     Cervical back: Normal range of motion and neck supple.  Skin:    General: Skin is warm and dry.     Capillary Refill: Capillary refill takes less than 2 seconds.     Coloration: Skin is not jaundiced or pale.     Findings: No bruising, erythema, lesion or rash.  Neurological:     General: No focal deficit present.     Mental Status: She is alert and oriented to person, place, and time. Mental status is at baseline.  Psychiatric:        Mood and Affect: Mood normal.        Behavior: Behavior normal.        Thought Content: Thought content normal.        Judgment: Judgment normal.     Results for orders placed or performed in visit on 03/04/21  Bayer DCA Hb A1c Waived  Result Value Ref Range   HB A1C (BAYER DCA - WAIVED) 5.6 <7.0 %  Comprehensive metabolic panel  Result Value Ref Range   Glucose 113 (H) 65 - 99 mg/dL   BUN 12 8 - 27 mg/dL   Creatinine, Ser 0.89 0.57 - 1.00 mg/dL   eGFR 70 >59 mL/min/1.73   BUN/Creatinine Ratio 13 12 - 28   Sodium 147 (H) 134 - 144 mmol/L   Potassium 3.9 3.5 - 5.2 mmol/L   Chloride 101 96 - 106 mmol/L   CO2 25 20 - 29 mmol/L    Calcium 9.6 8.7 - 10.3 mg/dL   Total Protein 7.1 6.0 - 8.5 g/dL   Albumin 4.4 3.8 - 4.8 g/dL   Globulin, Total 2.7 1.5 - 4.5 g/dL   Albumin/Globulin Ratio 1.6 1.2 - 2.2   Bilirubin Total 0.4 0.0 - 1.2 mg/dL   Alkaline Phosphatase 115 44 - 121 IU/L   AST 21 0 - 40 IU/L   ALT 11 0 - 32 IU/L  Lipid Panel w/o Chol/HDL  Ratio  Result Value Ref Range   Cholesterol, Total 170 100 - 199 mg/dL   Triglycerides 101 0 - 149 mg/dL   HDL 71 >39 mg/dL   VLDL Cholesterol Cal 18 5 - 40 mg/dL   LDL Chol Calc (NIH) 81 0 - 99 mg/dL  HCV RNA quant  Result Value Ref Range   Hepatitis C Quantitation HCV Not Detected IU/mL   Test Information Comment       Assessment & Plan:   Problem List Items Addressed This Visit      Other   Depression, recurrent ([place]) - Primary    Under good control on current regimen. Continue current regimen. Continue to monitor. Call with any concerns. Refills up to date. Continue to monitor.        Opioid dependence, uncomplicated (HCC)    Stable. Continue to monitor. Call with any concerns.        Other Visit Diagnoses    Vaginal atrophy       Will restart premarin. Call with any concerns. Continue to monitor.        Follow up plan: Return in about 5 months (around 09/04/2021).

## 2021-04-09 NOTE — Telephone Encounter (Signed)
Called pt to schedule she states that she will see when it will be best for her and call us back

## 2021-06-23 ENCOUNTER — Other Ambulatory Visit: Payer: Self-pay | Admitting: Family Medicine

## 2021-06-23 NOTE — Telephone Encounter (Signed)
Requested Prescriptions  Pending Prescriptions Disp Refills  . albuterol (VENTOLIN HFA) 108 (90 Base) MCG/ACT inhaler [Pharmacy Med Name: ALBUTEROL HFA 90 MCG INHALER] 8.5 g 0    Sig: Inhale 2 puffs into the lungs every 6 (six) hours as needed for wheezing or shortness of breath.     Pulmonology:  Beta Agonists Failed - 06/23/2021  3:50 PM      Failed - One inhaler should last at least one month. If the patient is requesting refills earlier, contact the patient to check for uncontrolled symptoms.      Passed - Valid encounter within last 12 months    Recent Outpatient Visits          2 months ago Depression, recurrent (HCC)   [facility] [place], Megan P, DO   3 months ago Diabetes mellitus type 2, diet-controlled (HCC)   [facility] [place], Megan P, DO   10 months ago Depression, recurrent [facility])   [facility] [place], Megan P, DO   11 months ago Hep C w/o coma, chronic (HCC)   [facility] [place], Megan P, DO   11 months ago Routine general medical examination at a health care facility   [facility], [place], [place]

## 2021-08-05 ENCOUNTER — Ambulatory Visit (INDEPENDENT_AMBULATORY_CARE_PROVIDER_SITE_OTHER): Payer: 59

## 2021-08-05 DIAGNOSIS — Z0000 Encounter for general adult medical examination without abnormal findings: Secondary | ICD-10-CM

## 2021-08-05 NOTE — Progress Notes (Signed)
Subjective:  I connected with  Kim Graham on 08/05/21 by an audio only telemedicine application and verified that I am speaking with the correct person using two identifiers.   I discussed the limitations, risks, security and privacy concerns of performing an evaluation and management service by telephone and the availability of in person appointments. I also discussed with the patient that there may be a patient responsible charge related to this service. The patient expressed understanding and verbally consented to this telephonic visit.  Location of Patient: Home  Location of Provider: Office  List any persons and their role that are participating in the visit with the patient. None   Review of Systems    Defer to PCP       Objective:    There were no vitals filed for this visit. There is no height or weight on file to calculate BMI.  Advanced Directives 08/05/2021 05/17/2020 04/26/2020  Does Patient Have a Medical Advance Directive? No No No  Would patient like information on creating a medical advance directive? Yes (Inpatient - patient defers creating a medical advance directive at this time - Information given) No - Patient declined No - Patient declined    Current Medications (verified) Outpatient Encounter Medications as of 08/05/2021  Medication Sig   albuterol (VENTOLIN HFA) 108 (90 Base) MCG/ACT inhaler Inhale 2 puffs into the lungs every 6 (six) hours as needed for wheezing or shortness of breath.   ANORO ELLIPTA 62.5-25 MCG/INH AEPB Inhale 1 puff into the lungs daily.   ARIPiprazole (ABILIFY) 20 MG tablet Take 1 tablet (20 mg total) by mouth daily.   conjugated estrogens (PREMARIN) vaginal cream Place 0.25 Applicatorfuls vaginally daily.   ibuprofen (ADVIL) 600 MG tablet Take 1 tablet (600 mg total) by mouth every 8 (eight) hours as needed.   METHADONE HCL PO Take 83 mg by mouth daily.   No facility-administered encounter medications on file as of 08/05/2021.     Allergies (verified) Patient has no known allergies.   History: Past Medical History:  Diagnosis Date   COPD (chronic obstructive pulmonary disease) (HCC)    Diabetes mellitus type 2, diet-controlled (HCC) 04/15/2018   No past surgical history on file. Family History  Problem Relation Age of Onset   Diabetes Mother    Cancer Father        Pancreatic   Diabetes Maternal Aunt    Diabetes Maternal Grandmother    Dementia Maternal Grandfather    Heart disease Maternal Grandfather    Social History   Socioeconomic History   Marital status: Single    Spouse name: Not on file   Number of children: Not on file   Years of education: Not on file   Highest education level: Not on file  Occupational History   Not on file  Tobacco Use   Smoking status: Every Day    Packs/day: 0.75    Years: 49.00    Pack years: 36.75    Types: Cigarettes    Last attempt to quit: 12/13/2018    Years since quitting: 2.6   Smokeless tobacco: Never  Vaping Use   Vaping Use: Never used  Substance and Sexual Activity   Alcohol use: Not Currently    Comment: On occasion   Drug use: No   Sexual activity: Not Currently  Other Topics Concern   Not on file  Social History Narrative   Not on file   Social Determinants of Health   Financial Resource Strain: Medium  Risk   Difficulty of Paying Living Expenses: Somewhat hard  Food Insecurity: Food Insecurity Present   Worried About Running Out of Food in the Last Year: Sometimes true   Ran Out of Food in the Last Year: Sometimes true  Transportation Needs: No Transportation Needs   Lack of Transportation (Medical): No   Lack of Transportation (Non-Medical): No  Physical Activity: Insufficiently Active   Days of Exercise per Week: 4 days   Minutes of Exercise per Session: 20 min  Stress: No Stress Concern Present   Feeling of Stress : Not at all  Social Connections: Moderately Isolated   Frequency of Communication with Friends and Family: More  than three times a week   Frequency of Social Gatherings with Friends and Family: Three times a week   Attends Religious Services: Never   Active Member of Clubs or Organizations: No   Attends Engineer, structural: More than 4 times per year   Marital Status: Divorced    Tobacco Counseling Ready to quit: Not Answered Counseling given: Not Answered   Clinical Intake:  Pre-visit preparation completed: Yes  Pain : No/denies pain     Diabetes: No  How often do you need to have someone help you when you read instructions, pamphlets, or other written materials from your doctor or pharmacy?: 1 - Never  Diabetic?No  Interpreter Needed?: No      Activities of Daily Living In your present state of health, do you have any difficulty performing the following activities: 08/05/2021  Hearing? N  Vision? N  Difficulty concentrating or making decisions? N  Walking or climbing stairs? N  Dressing or bathing? N  Doing errands, shopping? N  Preparing Food and eating ? N  Using the Toilet? N  In the past six months, have you accidently leaked urine? N  Do you have problems with loss of bowel control? N  Managing your Medications? N  Managing your Finances? N  Housekeeping or managing your Housekeeping? N  Some recent data might be hidden    Patient Care Team: Kim Carrow, DO as PCP - General (Family Medicine)  Indicate any recent Medical Services you may have received from other than [facility] providers in the past year (date may be approximate).     Assessment:   This is a routine wellness examination for Kim Graham.  Hearing/Vision screen No results found.  Dietary issues and exercise activities discussed:     Goals Addressed   None    Depression Screen PHQ 2/9 Scores 08/05/2021 04/04/2021 03/04/2021 07/31/2020 07/02/2020 05/17/2020 06/07/2019  PHQ - 2 Score 0 1 2 4 5  0 0  PHQ- 9 Score - 4 8 13 17  - 8    Fall Risk Fall Risk  08/05/2021 07/02/2020 07/02/2020 05/17/2020  06/07/2019  Falls in the past year? 0 1 1 1  0  Comment - - - tripped and fell on to coffee table -  Number falls in past yr: 0 1 0 0 -  Injury with Fall? 0 1 1 1  -  Risk for fall due to : - History of fall(s) - History of fall(s);Medication side effect -  Follow up - Education provided - Education provided;Falls prevention discussed;Falls evaluation completed Falls evaluation completed    FALL RISK PREVENTION PERTAINING TO THE HOME:  Any stairs in or around the home? Yes  If so, are there any without handrails? Yes  Home free of loose throw rugs in walkways, pet beds, electrical cords, etc? Yes  Adequate  lighting in your home to reduce risk of falls? Yes   ASSISTIVE DEVICES UTILIZED TO PREVENT FALLS:  Life alert? No  Use of a cane, walker or w/c? No  Grab bars in the bathroom? Yes  Shower chair or bench in shower? No  Elevated toilet seat or a handicapped toilet? No   TIMED UP AND GO:  Was the test performed?  N/A .  Length of time to ambulate 10 feet: N/A sec.   These questions cannot be answered via Telephone encounter.  Cognitive Function:     6CIT Screen 08/05/2021 07/02/2020 05/17/2020 09/19/2018 06/18/2017  What Year? 0 points 0 points 0 points 0 points 0 points  What month? 0 points 0 points 0 points 0 points 0 points  What time? 0 points 0 points 0 points 0 points 0 points  Count back from 20 0 points 0 points 0 points 0 points 0 points  Months in reverse 0 points 0 points 0 points 0 points 0 points  Repeat phrase 0 points 0 points 2 points 2 points 0 points  Total Score 0 0 2 2 0    Immunizations Immunization History  Administered Date(s) Administered   Fluad Quad(high Dose 65+) 10/02/2019   Hepatitis B, adult 07/11/2020, 08/14/2020   Influenza, High Dose Seasonal PF 08/27/2017, 09/19/2018   Influenza,inj,Quad PF,6+ Mos 07/11/2020   PFIZER(Purple Top)SARS-COV-2 Vaccination 01/11/2020, 02/01/2020   PPD Test 09/04/2020   Pneumococcal Conjugate-13 06/16/2017    Pneumococcal Polysaccharide-23 09/19/2018   Td 07/02/2020    TDAP status: Up to date  Flu Vaccine status: Due, Education has been provided regarding the importance of this vaccine. Advised may receive this vaccine at local pharmacy or Health Dept. Aware to provide a copy of the vaccination record if obtained from local pharmacy or Health Dept. Verbalized acceptance and understanding.  Pneumococcal vaccine status: Up to date  Covid-19 vaccine status: Completed vaccines  Qualifies for Shingles Vaccine? Yes   Zostavax completed No   Shingrix Completed?: No.    Education has been provided regarding the importance of this vaccine. Patient has been advised to call insurance company to determine out of pocket expense if they have not yet received this vaccine. Advised may also receive vaccine at local pharmacy or Health Dept. Verbalized acceptance and understanding.  Screening Tests Health Maintenance  Topic Date Due   Zoster Vaccines- Shingrix (1 of 2) Never done   MAMMOGRAM  Never done   DEXA SCAN  Never done   COVID-19 Vaccine (3 - Pfizer risk series) 02/29/2020   INFLUENZA VACCINE  06/09/2021   FOOT EXAM  07/02/2021   URINE MICROALBUMIN  07/02/2021   HEMOGLOBIN A1C  09/03/2021   OPHTHALMOLOGY EXAM  11/19/2021   Fecal DNA (Cologuard)  08/27/2023   TETANUS/TDAP  07/02/2030   Hepatitis C Screening  Completed   HPV VACCINES  Aged Out    Health Maintenance  Health Maintenance Due  Topic Date Due   Zoster Vaccines- Shingrix (1 of 2) Never done   MAMMOGRAM  Never done   DEXA SCAN  Never done   COVID-19 Vaccine (3 - Pfizer risk series) 02/29/2020   INFLUENZA VACCINE  06/09/2021   FOOT EXAM  07/02/2021   URINE MICROALBUMIN  07/02/2021    Colorectal cancer screening: Type of screening: Colonoscopy. Completed 2021. Repeat every   years  Mammogram status: Ordered  . Pt provided with contact info and advised to call to schedule appt.   Bone Density status: Ordered  . Pt provided  with contact info and advised to call to schedule appt.  Lung Cancer Screening: (Low Dose CT Chest recommended if Age 74-80 years, 30 pack-year currently smoking OR have quit w/in 15years.) does qualify. 01/2019  Lung Cancer Screening Referral: N/A  Additional Screening:  Hepatitis C Screening: does qualify; Completed 02/2021  Vision Screening: Recommended annual ophthalmology exams for early detection of glaucoma and other disorders of the eye. Is the patient up to date with their annual eye exam?  No  Who is the provider or what is the name of the office in which the patient attends annual eye exams? Dr ? If pt is not established with a provider, would they like to be referred to a provider to establish care? No .   Dental Screening: Recommended annual dental exams for proper oral hygiene  Community Resource Referral / Chronic Care Management: CRR required this visit?  No   CCM required this visit?  No      Plan:     I have personally reviewed and noted the following in the patient's chart:   Medical and social history Use of alcohol, tobacco or illicit drugs  Current medications and supplements including opioid prescriptions.  Functional ability and status Nutritional status Physical activity Advanced directives List of other physicians Hospitalizations, surgeries, and ER visits in previous 12 months Vitals Screenings to include cognitive, depression, and falls Referrals and appointments  In addition, I have reviewed and discussed with patient certain preventive protocols, quality metrics, and best practice recommendations. A written personalized care plan for preventive services as well as general preventive health recommendations were provided to patient.     Kenyon Ana, RMA   08/05/2021   Nurse Notes: Non Face to Face 30 minute visit encounter.   Ms. Creps , Thank you for taking time to come for your Medicare Wellness Visit. I appreciate your ongoing  commitment to your health goals. Please review the following plan we discussed and let me know if I can assist you in the future.   These are the goals we discussed:  Goals      Patient Stated     05/17/2020, to get over broken shoulder        This is a list of the screening recommended for you and due dates:  Health Maintenance  Topic Date Due   Zoster (Shingles) Vaccine (1 of 2) Never done   Mammogram  Never done   DEXA scan (bone density measurement)  Never done   COVID-19 Vaccine (3 - Pfizer risk series) 02/29/2020   Flu Shot  06/09/2021   Complete foot exam   07/02/2021   Urine Protein Check  07/02/2021   Hemoglobin A1C  09/03/2021   Eye exam for diabetics  11/19/2021   Cologuard (Stool DNA test)  08/27/2023   Tetanus Vaccine  07/02/2030   Hepatitis C Screening: USPSTF Recommendation to screen - Ages 18-79 yo.  Completed   HPV Vaccine  Aged Out

## 2021-08-19 ENCOUNTER — Other Ambulatory Visit: Payer: Self-pay | Admitting: Family Medicine

## 2021-08-19 NOTE — Telephone Encounter (Signed)
Requested Prescriptions  Pending Prescriptions Disp Refills  . albuterol (VENTOLIN HFA) 108 (90 Base) MCG/ACT inhaler [Pharmacy Med Name: ALBUTEROL HFA 90 MCG INHALER] 8.5 g 2    Sig: Inhale 2 puffs into the lungs every 6 (six) hours as needed for wheezing or shortness of breath.     Pulmonology:  Beta Agonists Failed - 08/19/2021 11:34 AM      Failed - One inhaler should last at least one month. If the patient is requesting refills earlier, contact the patient to check for uncontrolled symptoms.      Passed - Valid encounter within last 12 months    Recent Outpatient Visits          4 months ago Depression, recurrent (HCC)   [facility] [place], Megan P, DO   5 months ago Diabetes mellitus type 2, diet-controlled (HCC)   [facility] [place], Megan P, DO   1 year ago Depression, recurrent (HCC)   [facility] [place], Megan P, DO   1 year ago Hep C w/o coma, chronic (HCC)   [facility] [place], Megan P, DO   1 year ago Routine general medical examination at a health care facility   [facility], [place], DO

## 2021-09-15 ENCOUNTER — Ambulatory Visit (INDEPENDENT_AMBULATORY_CARE_PROVIDER_SITE_OTHER): Payer: 59 | Admitting: Nurse Practitioner

## 2021-09-15 ENCOUNTER — Other Ambulatory Visit: Payer: Self-pay

## 2021-09-15 ENCOUNTER — Encounter: Payer: Self-pay | Admitting: Nurse Practitioner

## 2021-09-15 VITALS — BP 112/68 | HR 81 | Temp 98.4°F | Wt 129.8 lb

## 2021-09-15 DIAGNOSIS — N3001 Acute cystitis with hematuria: Secondary | ICD-10-CM | POA: Diagnosis not present

## 2021-09-15 DIAGNOSIS — R300 Dysuria: Secondary | ICD-10-CM | POA: Diagnosis not present

## 2021-09-15 LAB — MICROSCOPIC EXAMINATION: Epithelial Cells (non renal): NONE SEEN /hpf (ref 0–10)

## 2021-09-15 LAB — URINALYSIS, ROUTINE W REFLEX MICROSCOPIC
Bilirubin, UA: NEGATIVE
Glucose, UA: NEGATIVE
Ketones, UA: NEGATIVE
Nitrite, UA: POSITIVE — AB
Specific Gravity, UA: 1.02 (ref 1.005–1.030)
Urobilinogen, Ur: 1 mg/dL (ref 0.2–1.0)
pH, UA: 5.5 (ref 5.0–7.5)

## 2021-09-15 MED ORDER — SULFAMETHOXAZOLE-TRIMETHOPRIM 800-160 MG PO TABS: 1.0000 | ORAL_TABLET | Freq: Two times a day (BID) | ORAL | 0 refills | Status: DC

## 2021-09-15 NOTE — Progress Notes (Signed)
Acute Office Visit  Subjective:    Patient ID: Kim Graham, female    DOB: 1951-10-07, 70 y.o.   MRN: [id number]  Chief Complaint  Patient presents with   Urinary Tract Infection    HPI Patient is in today for dysuria and back pain for 2 days.   URINARY SYMPTOMS  Dysuria: burning Urinary frequency: yes Urgency: yes Small volume voids: no Symptom severity: moderate Urinary incontinence: no Foul odor: no Hematuria: yes Abdominal pain: no Back pain: no Suprapubic pain/pressure: yes Flank pain: no Fever:  no Vomiting: no Relief with cranberry juice: no Relief with pyridium:  n/a Status: worse Previous urinary tract infection: yes - a long time ago Recurrent urinary tract infection: no Treatments attempted: cranberry    Past Medical History:  Diagnosis Date   COPD (chronic obstructive pulmonary disease) (HCC)    Diabetes mellitus type 2, diet-controlled ([place]) 04/15/2018    History reviewed. No pertinent surgical history.  Family History  Problem Relation Age of Onset   Diabetes Mother    Cancer Father        Pancreatic   Diabetes Maternal Aunt    Diabetes Maternal Grandmother    Dementia Maternal Grandfather    Heart disease Maternal Grandfather     Social History   Socioeconomic History   Marital status: Single    Spouse name: Not on file   Number of children: Not on file   Years of education: Not on file   Highest education level: Not on file  Occupational History   Not on file  Tobacco Use   Smoking status: Every Day    Packs/day: 0.75    Years: 49.00    Pack years: 36.75    Types: Cigarettes    Last attempt to quit: 12/13/2018    Years since quitting: 2.7   Smokeless tobacco: Never  Vaping Use   Vaping Use: Never used  Substance and Sexual Activity   Alcohol use: Not Currently    Comment: On occasion   Drug use: No   Sexual activity: Not Currently  Other Topics Concern   Not on file  Social History Narrative   Not on file   Social  Determinants of Health   Financial Resource Strain: Medium Risk   Difficulty of Paying Living Expenses: Somewhat hard  Food Insecurity: Food Insecurity Present   Worried About Running Out of Food in the Last Year: Sometimes true   Ran Out of Food in the Last Year: Sometimes true  Transportation Needs: No Transportation Needs   Lack of Transportation (Medical): No   Lack of Transportation (Non-Medical): No  Physical Activity: Insufficiently Active   Days of Exercise per Week: 4 days   Minutes of Exercise per Session: 20 min  Stress: No Stress Concern Present   Feeling of Stress : Not at all  Social Connections: Moderately Isolated   Frequency of Communication with Friends and Family: More than three times a week   Frequency of Social Gatherings with Friends and Family: Three times a week   Attends Religious Services: Never   Active Member of Clubs or Organizations: No   Attends Music therapist: More than 4 times per year   Marital Status: Divorced  Human resources officer Violence: Not At Risk   Fear of Current or Ex-Partner: No   Emotionally Abused: No   Physically Abused: No   Sexually Abused: No    Outpatient Medications Prior to Visit  Medication Sig Dispense Refill   albuterol (  VENTOLIN HFA) 108 (90 Base) MCG/ACT inhaler Inhale 2 puffs into the lungs every 6 (six) hours as needed for wheezing or shortness of breath. 8.5 g 2   ANORO ELLIPTA 62.5-25 MCG/INH AEPB Inhale 1 puff into the lungs daily. 3 each 3   ARIPiprazole (ABILIFY) 20 MG tablet Take 1 tablet (20 mg total) by mouth daily. 90 tablet 1   conjugated estrogens (PREMARIN) vaginal cream Place 7.41 Applicatorfuls vaginally daily. 42.5 g 11   ibuprofen (ADVIL) 600 MG tablet Take 1 tablet (600 mg total) by mouth every 8 (eight) hours as needed. 15 tablet 0   METHADONE HCL PO Take 83 mg by mouth daily.     No facility-administered medications prior to visit.    No Known Allergies  Review of Systems   Constitutional: Negative.   Respiratory: Negative.    Cardiovascular: Negative.   Gastrointestinal:  Positive for abdominal pain (lower abdominal pressure). Negative for nausea and vomiting.  Genitourinary:  Positive for dysuria, frequency, hematuria and urgency. Negative for flank pain.  Musculoskeletal: Negative.   Neurological: Negative.       Objective:    Physical Exam Vitals and nursing note reviewed.  Constitutional:      General: She is not in acute distress.    Appearance: Normal appearance.  HENT:     Head: Normocephalic.  Eyes:     Conjunctiva/sclera: Conjunctivae normal.  Cardiovascular:     Rate and Rhythm: Normal rate and regular rhythm.     Pulses: Normal pulses.     Heart sounds: Normal heart sounds.  Pulmonary:     Effort: Pulmonary effort is normal.     Breath sounds: Normal breath sounds.  Abdominal:     Palpations: Abdomen is soft.     Tenderness: There is no abdominal tenderness. There is no right CVA tenderness or left CVA tenderness.  Musculoskeletal:     Cervical back: Normal range of motion.  Skin:    General: Skin is warm.  Neurological:     General: No focal deficit present.     Mental Status: She is alert and oriented to person, place, and time.  Psychiatric:        Mood and Affect: Mood normal.        Behavior: Behavior normal.        Thought Content: Thought content normal.        Judgment: Judgment normal.    BP 112/68   Pulse 81   Temp 98.4 F (36.9 C) (Oral)   Wt 129 lb 12.8 oz (58.9 kg)   SpO2 96%   BMI 21.60 kg/m  Wt Readings from Last 3 Encounters:  09/15/21 129 lb 12.8 oz (58.9 kg)  04/04/21 133 lb 9.6 oz (60.6 kg)  03/04/21 131 lb 12.8 oz (59.8 kg)    Health Maintenance Due  Topic Date Due   Zoster Vaccines- Shingrix (1 of 2) Never done   MAMMOGRAM  Never done   DEXA SCAN  Never done   COVID-19 Vaccine (3 - Pfizer risk series) 02/29/2020   INFLUENZA VACCINE  06/09/2021   FOOT EXAM  07/02/2021   URINE  MICROALBUMIN  07/02/2021   HEMOGLOBIN A1C  09/03/2021    There are no preventive care reminders to display for this patient.   Lab Results  Component Value Date   TSH 1.350 07/02/2020   Lab Results  Component Value Date   WBC 6.5 07/02/2020   HGB 16.4 (H) 07/02/2020   HCT 48.2 (H) 07/02/2020  MCV 93 07/02/2020   PLT 137 (L) 07/02/2020   Lab Results  Component Value Date   NA 147 (H) 03/04/2021   K 3.9 03/04/2021   CO2 25 03/04/2021   GLUCOSE 113 (H) 03/04/2021   BUN 12 03/04/2021   CREATININE 0.89 03/04/2021   BILITOT 0.4 03/04/2021   ALKPHOS 115 03/04/2021   AST 21 03/04/2021   ALT 11 03/04/2021   PROT 7.1 03/04/2021   ALBUMIN 4.4 03/04/2021   CALCIUM 9.6 03/04/2021   EGFR 70 03/04/2021   Lab Results  Component Value Date   CHOL 170 03/04/2021   Lab Results  Component Value Date   HDL 71 03/04/2021   Lab Results  Component Value Date   LDLCALC 81 03/04/2021   Lab Results  Component Value Date   TRIG 101 03/04/2021   No results found for: CHOLHDL Lab Results  Component Value Date   HGBA1C 5.6 03/04/2021       Assessment & Plan:   Problem List Items Addressed This Visit   None Visit Diagnoses     Dysuria    -  Primary   U/A positive for many bacteria, 3+ blood, and 3+ leukocytes. Will treat for UTI   Relevant Orders   Urinalysis, Routine w reflex microscopic   Acute cystitis with hematuria       Will treat with bactrim BID x7 days. Add on urine culture. Increase fluids. F/U for worsening symptoms or concerns.    Relevant Orders   Urine Culture        Meds ordered this encounter  Medications   sulfamethoxazole-trimethoprim (BACTRIM DS) 800-160 MG tablet    Sig: Take 1 tablet by mouth 2 (two) times daily.    Dispense:  14 tablet    Refill:  0      Charyl Dancer, NP

## 2021-09-20 LAB — URINE CULTURE

## 2021-09-25 ENCOUNTER — Encounter: Payer: Self-pay | Admitting: Internal Medicine

## 2021-09-25 ENCOUNTER — Ambulatory Visit (INDEPENDENT_AMBULATORY_CARE_PROVIDER_SITE_OTHER): Payer: 59 | Admitting: Internal Medicine

## 2021-09-25 DIAGNOSIS — J069 Acute upper respiratory infection, unspecified: Secondary | ICD-10-CM | POA: Insufficient documentation

## 2021-09-25 DIAGNOSIS — R0981 Nasal congestion: Secondary | ICD-10-CM | POA: Diagnosis not present

## 2021-09-25 MED ORDER — AMOXICILLIN-POT CLAVULANATE 875-125 MG PO TABS: 1.0000 | ORAL_TABLET | Freq: Two times a day (BID) | ORAL | 0 refills | Status: AC

## 2021-09-25 MED ORDER — METHYLPREDNISOLONE 4 MG PO TBPK: ORAL_TABLET | ORAL | 1 refills | Status: DC

## 2021-09-25 MED ORDER — FEXOFENADINE HCL 180 MG PO TABS: 180.0000 mg | ORAL_TABLET | Freq: Every day | ORAL | 1 refills | Status: DC

## 2021-09-25 MED ORDER — BENZONATATE 100 MG PO CAPS: 100.0000 mg | ORAL_CAPSULE | Freq: Two times a day (BID) | ORAL | 0 refills | Status: DC | PRN

## 2021-09-25 NOTE — Progress Notes (Addendum)
There were no vitals taken for this visit.   Subjective:    Patient ID: Kim Graham, female    DOB: Mar 23, 1951, 70 y.o.   MRN: [id number]  Chief Complaint  Patient presents with   Cough    Has been having chest congestion, nasal congestion, headaches, and a cough since Saturday.    HPI: Kim Graham is a 70 y.o. female  Patient presents with: Cough: Has been having chest congestion, nasal congestion, headaches, and a cough since Saturday.    Cough This is a new problem. The current episode started in the past 7 days. The problem has been unchanged (felt bad today no energy and seems like she cannot feel warm.). Cough characteristics: yellow phlegm. Associated symptoms include chills, nasal congestion, rhinorrhea, shortness of breath and wheezing. Pertinent negatives include no chest pain, ear congestion, ear pain, fever, heartburn, hemoptysis, myalgias, postnasal drip, rash, sore throat, sweats or weight loss. Associated symptoms comments: 98.6. The treatment provided mild relief.   Chief Complaint  Patient presents with   Cough    Has been having chest congestion, nasal congestion, headaches, and a cough since Saturday.    Relevant past medical, surgical, family and social history reviewed and updated as indicated. Interim medical history since our last visit reviewed. Allergies and medications reviewed and updated.  Review of Systems  Constitutional:  Positive for chills. Negative for fever and weight loss.  HENT:  Positive for rhinorrhea. Negative for ear pain, postnasal drip and sore throat.   Respiratory:  Positive for cough, shortness of breath and wheezing. Negative for hemoptysis.   Cardiovascular:  Negative for chest pain.  Gastrointestinal:  Negative for heartburn.  Musculoskeletal:  Negative for myalgias.  Skin:  Negative for rash.   Per HPI unless specifically indicated above   Objective:    There were no vitals taken for this visit.  Wt Readings from  Last 3 Encounters:  09/15/21 129 lb 12.8 oz (58.9 kg)  04/04/21 133 lb 9.6 oz (60.6 kg)  03/04/21 131 lb 12.8 oz (59.8 kg)    Physical Exam Unable to peform sec to virtual visit.   Results for orders placed or performed in visit on 09/15/21  Urine Culture   Specimen: Urine   UR  Result Value Ref Range   Urine Culture, Routine Final report (A)    Organism ID, Bacteria Escherichia coli (A)    Antimicrobial Susceptibility Comment   Microscopic Examination   Urine  Result Value Ref Range   WBC, UA 11-30 (A) 0 - 5 /hpf   RBC 11-30 (A) 0 - 2 /hpf   Epithelial Cells (non renal) None seen 0 - 10 /hpf   Bacteria, UA Many (A) None seen/Few  Urinalysis, Routine w reflex microscopic  Result Value Ref Range   Specific Gravity, UA 1.020 1.005 - 1.030   pH, UA 5.5 5.0 - 7.5   Color, UA Yellow Yellow   Appearance Ur Cloudy (A) Clear   Leukocytes,UA 3+ (A) Negative   Protein,UA 1+ (A) Negative/Trace   Glucose, UA Negative Negative   Ketones, UA Negative Negative   RBC, UA 3+ (A) Negative   Bilirubin, UA Negative Negative   Urobilinogen, Ur 1.0 0.2 - 1.0 mg/dL   Nitrite, UA Positive (A) Negative   Microscopic Examination See below:         Current Outpatient Medications:    albuterol (VENTOLIN HFA) 108 (90 Base) MCG/ACT inhaler, Inhale 2 puffs into the lungs every 6 (six) hours as  needed for wheezing or shortness of breath., Disp: 8.5 g, Rfl: 2   ANORO ELLIPTA 62.5-25 MCG/INH AEPB, Inhale 1 puff into the lungs daily., Disp: 3 each, Rfl: 3   ARIPiprazole (ABILIFY) 20 MG tablet, Take 1 tablet (20 mg total) by mouth daily., Disp: 90 tablet, Rfl: 1   conjugated estrogens (PREMARIN) vaginal cream, Place 0.25 Applicatorfuls vaginally daily., Disp: 42.5 g, Rfl: 11   ibuprofen (ADVIL) 600 MG tablet, Take 1 tablet (600 mg total) by mouth every 8 (eight) hours as needed., Disp: 15 tablet, Rfl: 0   METHADONE HCL PO, Take 83 mg by mouth daily., Disp: , Rfl:     Assessment & Plan:  URI : Check  Flu, COVID  and strep tomorrow am pt to start taking augmentin /  pt advised to take Tylenol q 4- 6 hourly as needed. pt to take allegra q pm as needed and to call office if symptoms worsened pt verbalised understanding of such.     Problem List Items Addressed This Visit   None    No orders of the defined types were placed in this encounter.    Meds ordered this encounter  Medications   methylPREDNISolone (MEDROL DOSEPAK) 4 MG TBPK tablet    Sig: Use as directed    Dispense:  1 each    Refill:  1   amoxicillin-clavulanate (AUGMENTIN) 875-125 MG tablet    Sig: Take 1 tablet by mouth 2 (two) times daily for 7 days.    Dispense:  14 tablet    Refill:  0   fexofenadine (ALLEGRA ALLERGY) 180 MG tablet    Sig: Take 1 tablet (180 mg total) by mouth daily.    Dispense:  10 tablet    Refill:  1   benzonatate (TESSALON) 100 MG capsule    Sig: Take 1 capsule (100 mg total) by mouth 2 (two) times daily as needed for cough.    Dispense:  20 capsule    Refill:  0     Follow up plan: No follow-ups on file.   This visit was completed via telephone due to the restrictions of the COVID-19 pandemic. All issues as above were discussed and addressed but no physical exam was performed. If it was felt that the patient should be evaluated in the office, they were directed there. The patient verbally consented to this visit. Patient was unable to complete an audio/visual visit due to Technical difficulties", "Lack of internet. Due to the catastrophic nature of the COVID-19 pandemic, this visit was done through audio contact only. Location of the patient: home Location of the provider: work Those involved with this call:  Provider: Loura Pardon, MD CMA: Tristan Schroeder, CMA Front Desk/Registration: [employer]  Time spent on call:  10 minutes on the phone discussing health concerns. 10 minutes total spent in review of patient's record and preparation of their chart.

## 2021-10-14 ENCOUNTER — Ambulatory Visit (INDEPENDENT_AMBULATORY_CARE_PROVIDER_SITE_OTHER): Payer: 59 | Admitting: Family Medicine

## 2021-10-14 ENCOUNTER — Encounter: Payer: Self-pay | Admitting: Family Medicine

## 2021-10-14 ENCOUNTER — Other Ambulatory Visit: Payer: Self-pay

## 2021-10-14 VITALS — BP 144/81 | HR 82 | Temp 98.5°F | Ht 62.0 in | Wt 131.2 lb

## 2021-10-14 DIAGNOSIS — R7301 Impaired fasting glucose: Secondary | ICD-10-CM | POA: Diagnosis not present

## 2021-10-14 DIAGNOSIS — J441 Chronic obstructive pulmonary disease with (acute) exacerbation: Secondary | ICD-10-CM | POA: Diagnosis not present

## 2021-10-14 DIAGNOSIS — Z23 Encounter for immunization: Secondary | ICD-10-CM

## 2021-10-14 DIAGNOSIS — B182 Chronic viral hepatitis C: Secondary | ICD-10-CM

## 2021-10-14 DIAGNOSIS — F339 Major depressive disorder, recurrent, unspecified: Secondary | ICD-10-CM | POA: Diagnosis not present

## 2021-10-14 DIAGNOSIS — Z0000 Encounter for general adult medical examination without abnormal findings: Secondary | ICD-10-CM

## 2021-10-14 LAB — URINALYSIS, ROUTINE W REFLEX MICROSCOPIC
Bilirubin, UA: NEGATIVE
Glucose, UA: NEGATIVE
Ketones, UA: NEGATIVE
Nitrite, UA: NEGATIVE
Specific Gravity, UA: 1.015 (ref 1.005–1.030)
Urobilinogen, Ur: 0.2 mg/dL (ref 0.2–1.0)
pH, UA: 6 (ref 5.0–7.5)

## 2021-10-14 LAB — MICROALBUMIN, URINE WAIVED
Creatinine, Urine Waived: 100 mg/dL (ref 10–300)
Microalb, Ur Waived: 30 mg/L — ABNORMAL HIGH (ref 0–19)
Microalb/Creat Ratio: <30 mg/g (ref <30)

## 2021-10-14 LAB — MICROSCOPIC EXAMINATION

## 2021-10-14 LAB — BAYER DCA HB A1C WAIVED: HB A1C (BAYER DCA - WAIVED): 5.8 % — ABNORMAL HIGH (ref 4.8–5.6)

## 2021-10-14 MED ORDER — PREDNISONE 10 MG PO TABS: ORAL_TABLET | ORAL | 0 refills | Status: DC

## 2021-10-14 MED ORDER — ARIPIPRAZOLE 20 MG PO TABS: 20.0000 mg | ORAL_TABLET | Freq: Every day | ORAL | 1 refills | Status: DC

## 2021-10-14 MED ORDER — UMECLIDINIUM-VILANTEROL 62.5-25 MCG/ACT IN AEPB: 1.0000 | INHALATION_SPRAY | Freq: Every day | RESPIRATORY_TRACT | 12 refills | Status: DC

## 2021-10-14 MED ORDER — ALBUTEROL SULFATE HFA 108 (90 BASE) MCG/ACT IN AERS: 2.0000 | INHALATION_SPRAY | Freq: Four times a day (QID) | RESPIRATORY_TRACT | 2 refills | Status: DC | PRN

## 2021-10-14 NOTE — Assessment & Plan Note (Signed)
Doing well with A1c of 5.8. Continue diet and exercise. Call with any concerns.  

## 2021-10-14 NOTE — Assessment & Plan Note (Signed)
S/p treatment. Will check labs today. Await results. Treat as needed Hep B#3 given today.

## 2021-10-14 NOTE — Assessment & Plan Note (Signed)
Under good control on current regimen. Continue current regimen. Continue to monitor. Call with any concerns. Refills given. Labs drawn today.   

## 2021-10-14 NOTE — Progress Notes (Signed)
BP (!) 144/81   Pulse 82   Temp 98.5 F (36.9 C)   Ht 5\' 2"  (1.575 m)   Wt 131 lb 3.2 oz (59.5 kg)   SpO2 95%   BMI 24.00 kg/m    Subjective:    Patient ID: , female    DOB: 31-Jan-1951, 70 y.o.   MRN: [id number]  HPI: Kim Graham is a 70 y.o. female presenting on 10/14/2021 for comprehensive medical examination. Current medical complaints include:  UPPER RESPIRATORY TRACT INFECTION Duration: 3 weeks Worst symptom: cough Fever: no Cough: yes Shortness of breath: yes Wheezing: no Chest pain: no Chest tightness: no Chest congestion: no Nasal congestion: yes Runny nose: no Post nasal drip: no Sneezing: no Sore throat: no Swollen glands: no Sinus pressure: no Headache: no Face pain: no Toothache: no Ear pain: no  Ear pressure: no  Eyes red/itching:no Eye drainage/crusting: no  Vomiting: no Rash: no Fatigue: yes Sick contacts: no Strep contacts: no  Context: better Recurrent sinusitis: no Relief with OTC cold/cough medications: no  Treatments attempted: augmentin   DEPRESSION Mood status: controlled Satisfied with current treatment?: yes Symptom severity: mild  Duration of current treatment : chronic Side effects: no Medication compliance: excellent compliance Psychotherapy/counseling: no  Previous psychiatric medications: abilify Depressed mood: no Anxious mood: no Anhedonia: no Significant weight loss or gain: no Insomnia: no  Fatigue: yes Feelings of worthlessness or guilt: no Impaired concentration/indecisiveness: no Suicidal ideations: no Hopelessness: no Crying spells: no Depression screen [facility] 2/9 10/14/2021 09/25/2021 09/15/2021 08/05/2021 04/04/2021  Decreased Interest 0 0 0 0 0  Down, Depressed, Hopeless 0 0 1 0 1  PHQ - 2 Score 0 0 1 0 1  Altered sleeping 1 0 1 - 1  Tired, decreased energy 1 0 1 - 1  Change in appetite 1 0 0 - 1  Feeling bad or failure about yourself  0 0 0 - 0  Trouble concentrating 0 0 0 - 0  Moving  slowly or fidgety/restless 0 0 0 - 0  Suicidal thoughts 0 0 0 - 0  PHQ-9 Score 3 0 3 - 4  Difficult doing work/chores - Not difficult at all Not difficult at all - Not difficult at all  Some recent data might be hidden   HEPATITIS C Duration since diagnosis:  couple of years Hep C transmission: IV drugs Hepatology evaluation:yes Liver biopsy:no  Cirrhosis: no Antiviral therapy:yes Hepatocellular carcinoma screening: no Esophageal varices screening/EGD: yes Hepatitis A Vaccine: unknown Hepatitis B Vaccine: Up to Date Pneumovax Vaccine: Up to Date  Menopausal Symptoms: no  Depression Screen done today and results listed below:  Depression screen [facility] 2/9 10/14/2021 09/25/2021 09/15/2021 08/05/2021 04/04/2021  Decreased Interest 0 0 0 0 0  Down, Depressed, Hopeless 0 0 1 0 1  PHQ - 2 Score 0 0 1 0 1  Altered sleeping 1 0 1 - 1  Tired, decreased energy 1 0 1 - 1  Change in appetite 1 0 0 - 1  Feeling bad or failure about yourself  0 0 0 - 0  Trouble concentrating 0 0 0 - 0  Moving slowly or fidgety/restless 0 0 0 - 0  Suicidal thoughts 0 0 0 - 0  PHQ-9 Score 3 0 3 - 4  Difficult doing work/chores - Not difficult at all Not difficult at all - Not difficult at all  Some recent data might be hidden   Past Medical History:  Past Medical History:  Diagnosis Date  COPD (chronic obstructive pulmonary disease) (HCC)    Diabetes mellitus type 2, diet-controlled (HCC) 04/15/2018    Surgical History:  History reviewed. No pertinent surgical history.  Medications:  Current Outpatient Medications on File Prior to Visit  Medication Sig   conjugated estrogens (PREMARIN) vaginal cream Place 0.25 Applicatorfuls vaginally daily.   ibuprofen (ADVIL) 600 MG tablet Take 1 tablet (600 mg total) by mouth every 8 (eight) hours as needed.   METHADONE HCL PO Take 83 mg by mouth daily.   No current facility-administered medications on file prior to visit.    Allergies:  No Known  Allergies  Social History:  Social History   Socioeconomic History   Marital status: Single    Spouse name: Not on file   Number of children: Not on file   Years of education: Not on file   Highest education level: Not on file  Occupational History   Not on file  Tobacco Use   Smoking status: Every Day    Packs/day: 0.50    Years: 49.00    Pack years: 24.50    Types: Cigarettes    Last attempt to quit: 12/13/2018    Years since quitting: 2.8   Smokeless tobacco: Never  Vaping Use   Vaping Use: Never used  Substance and Sexual Activity   Alcohol use: Not Currently    Comment: On occasion   Drug use: No   Sexual activity: Not Currently  Other Topics Concern   Not on file  Social History Narrative   Not on file   Social Determinants of Health   Financial Resource Strain: Medium Risk   Difficulty of Paying Living Expenses: Somewhat hard  Food Insecurity: Food Insecurity Present   Worried About Running Out of Food in the Last Year: Sometimes true   Ran Out of Food in the Last Year: Sometimes true  Transportation Needs: No Transportation Needs   Lack of Transportation (Medical): No   Lack of Transportation (Non-Medical): No  Physical Activity: Insufficiently Active   Days of Exercise per Week: 4 days   Minutes of Exercise per Session: 20 min  Stress: No Stress Concern Present   Feeling of Stress : Not at all  Social Connections: Moderately Isolated   Frequency of Communication with Friends and Family: More than three times a week   Frequency of Social Gatherings with Friends and Family: Three times a week   Attends Religious Services: Never   Active Member of Clubs or Organizations: No   Attends Engineer, structural: More than 4 times per year   Marital Status: Divorced  Catering manager Violence: Not At Risk   Fear of Current or Ex-Partner: No   Emotionally Abused: No   Physically Abused: No   Sexually Abused: No   Social History   Tobacco Use   Smoking Status Every Day   Packs/day: 0.50   Years: 49.00   Pack years: 24.50   Types: Cigarettes   Last attempt to quit: 12/13/2018   Years since quitting: 2.8  Smokeless Tobacco Never   Social History   Substance and Sexual Activity  Alcohol Use Not Currently   Comment: On occasion    Family History:  Family History  Problem Relation Age of Onset   Diabetes Mother    Cancer Father        Pancreatic   Diabetes Maternal Aunt    Diabetes Maternal Grandmother    Dementia Maternal Grandfather    Heart disease Maternal Grandfather  Past medical history, surgical history, medications, allergies, family history and social history reviewed with patient today and changes made to appropriate areas of the chart.   Review of Systems  Constitutional: Negative.   HENT:  Positive for congestion. Negative for ear discharge, ear pain, hearing loss, nosebleeds, sinus pain, sore throat and tinnitus.   Eyes: Negative.   Respiratory:  Positive for cough and shortness of breath. Negative for hemoptysis, sputum production, wheezing and stridor.   Cardiovascular: Negative.   Gastrointestinal: Negative.   Genitourinary: Negative.   Musculoskeletal: Negative.   Skin: Negative.   Neurological: Negative.   Endo/Heme/Allergies: Negative.   Psychiatric/Behavioral: Negative.    All other ROS negative except what is listed above and in the HPI.      Objective:    BP (!) 144/81   Pulse 82   Temp 98.5 F (36.9 C)   Ht 5\' 2"  (1.575 m)   Wt 131 lb 3.2 oz (59.5 kg)   SpO2 95%   BMI 24.00 kg/m   Wt Readings from Last 3 Encounters:  10/14/21 131 lb 3.2 oz (59.5 kg)  09/15/21 129 lb 12.8 oz (58.9 kg)  04/04/21 133 lb 9.6 oz (60.6 kg)    Physical Exam Vitals and nursing note reviewed.  Constitutional:      General: She is not in acute distress.    Appearance: Normal appearance. She is not ill-appearing, toxic-appearing or diaphoretic.  HENT:     Head: Normocephalic and atraumatic.      Right Ear: Tympanic membrane, ear canal and external ear normal. There is no impacted cerumen.     Left Ear: Tympanic membrane, ear canal and external ear normal. There is no impacted cerumen.     Nose: Nose normal. No congestion or rhinorrhea.     Mouth/Throat:     Mouth: Mucous membranes are moist.     Pharynx: Oropharynx is clear. No oropharyngeal exudate or posterior oropharyngeal erythema.  Eyes:     General: No scleral icterus.       Right eye: No discharge.        Left eye: No discharge.     Extraocular Movements: Extraocular movements intact.     Conjunctiva/sclera: Conjunctivae normal.     Pupils: Pupils are equal, round, and reactive to light.  Neck:     Vascular: No carotid bruit.  Cardiovascular:     Rate and Rhythm: Normal rate and regular rhythm.     Pulses: Normal pulses.     Heart sounds: No murmur heard.   No friction rub. No gallop.  Pulmonary:     Effort: Pulmonary effort is normal. No respiratory distress.     Breath sounds: No stridor. Wheezing present. No rhonchi or rales.  Chest:     Chest wall: No tenderness.  Abdominal:     General: Abdomen is flat. Bowel sounds are normal. There is no distension.     Palpations: Abdomen is soft. There is no mass.     Tenderness: There is no abdominal tenderness. There is no right CVA tenderness, left CVA tenderness, guarding or rebound.     Hernia: No hernia is present.  Genitourinary:    Comments: Breast and pelvic exams deferred with shared decision making Musculoskeletal:        General: No swelling, tenderness, deformity or signs of injury.     Cervical back: Normal range of motion and neck supple. No rigidity. No muscular tenderness.     Right lower leg: No edema.  Left lower leg: No edema.  Lymphadenopathy:     Cervical: No cervical adenopathy.  Skin:    General: Skin is warm and dry.     Capillary Refill: Capillary refill takes less than 2 seconds.     Coloration: Skin is not jaundiced or pale.      Findings: No bruising, erythema, lesion or rash.  Neurological:     General: No focal deficit present.     Mental Status: She is alert and oriented to person, place, and time. Mental status is at baseline.     Cranial Nerves: No cranial nerve deficit.     Sensory: No sensory deficit.     Motor: No weakness.     Coordination: Coordination normal.     Gait: Gait normal.     Deep Tendon Reflexes: Reflexes normal.  Psychiatric:        Mood and Affect: Mood normal.        Behavior: Behavior normal.        Thought Content: Thought content normal.        Judgment: Judgment normal.    Results for orders placed or performed in visit on 10/14/21  Microscopic Examination   BLD  Result Value Ref Range   WBC, UA 6-10 (A) 0 - 5 /hpf   RBC 11-30 (A) 0 - 2 /hpf   Epithelial Cells (non renal) 0-10 0 - 10 /hpf   Bacteria, UA Few None seen/Few  Bayer DCA Hb A1c Waived  Result Value Ref Range   HB A1C (BAYER DCA - WAIVED) 5.8 (H) 4.8 - 5.6 %  Urinalysis, Routine w reflex microscopic  Result Value Ref Range   Specific Gravity, UA 1.015 1.005 - 1.030   pH, UA 6.0 5.0 - 7.5   Color, UA Yellow Yellow   Appearance Ur Cloudy (A) Clear   Leukocytes,UA 1+ (A) Negative   Protein,UA Trace (A) Negative/Trace   Glucose, UA Negative Negative   Ketones, UA Negative Negative   RBC, UA 3+ (A) Negative   Bilirubin, UA Negative Negative   Urobilinogen, Ur 0.2 0.2 - 1.0 mg/dL   Nitrite, UA Negative Negative   Microscopic Examination See below:   Microalbumin, Urine Waived  Result Value Ref Range   Microalb, Ur Waived 30 (H) 0 - 19 mg/L   Creatinine, Urine Waived 100 10 - 300 mg/dL   Microalb/Creat Ratio <30 <30 mg/g      Assessment & Plan:   Problem List Items Addressed This Visit       Respiratory   Chronic obstructive pulmonary disease (HCC)    Under good control on current regimen. Continue current regimen. Continue to monitor. Call with any concerns. Refills given. Labs drawn today.         Relevant Medications   albuterol (VENTOLIN HFA) 108 (90 Base) MCG/ACT inhaler   umeclidinium-vilanterol (ANORO ELLIPTA) 62.5-25 MCG/ACT AEPB   predniSONE (DELTASONE) 10 MG tablet     Digestive   Hep C w/o coma, chronic (HCC)    S/p treatment. Will check labs today. Await results. Treat as needed Hep B#3 given today.       Relevant Orders   CBC with Differential/Platelet   Comprehensive metabolic panel   HCV RNA quant     Endocrine   IFG (impaired fasting glucose)    Doing well with A1c of 5.8. Continue diet and exercise. Call with any concerns.       Relevant Orders   Bayer DCA Hb A1c Waived (Completed)  CBC with Differential/Platelet   Comprehensive metabolic panel   Lipid Panel w/o Chol/HDL Ratio   Urinalysis, Routine w reflex microscopic (Completed)   TSH   Microalbumin, Urine Waived (Completed)     Other   Depression, recurrent (HCC)    Under good control on current regimen. Continue current regimen. Continue to monitor. Call with any concerns. Refills given. Labs drawn today.        Relevant Orders   CBC with Differential/Platelet   Comprehensive metabolic panel   TSH   Other Visit Diagnoses     Routine general medical examination at a health care facility    -  Primary   Vaccines up to date. Screening labs checked today. Mammo and DEXA ordered today. Cologuard up to date. Continue diet and exercise. Call with any concerns.    COPD exacerbation (HCC)       Relevant Medications   albuterol (VENTOLIN HFA) 108 (90 Base) MCG/ACT inhaler   umeclidinium-vilanterol (ANORO ELLIPTA) 62.5-25 MCG/ACT AEPB   predniSONE (DELTASONE) 10 MG tablet        Follow up plan: Return in about 2 weeks (around 10/28/2021), or lung recheck if not better.   LABORATORY TESTING:  - Pap smear: not applicable  IMMUNIZATIONS:   - Tdap: Tetanus vaccination status reviewed: last tetanus booster within 10 years. - Influenza: Up to date - Pneumovax: Up to date - Prevnar: Up to  date - COVID: Up to date - HPV: Not applicable - Shingrix vaccine: Given elsewhere  SCREENING: -Mammogram: Ordered today  - Colonoscopy: Up to date  - Bone Density: Ordered today   PATIENT COUNSELING:   Advised to take 1 mg of folate supplement per day if capable of pregnancy.   Sexuality: Discussed sexually transmitted diseases, partner selection, use of condoms, avoidance of unintended pregnancy  and contraceptive alternatives.   Advised to avoid cigarette smoking.  I discussed with the patient that most people either abstain from alcohol or drink within safe limits (<=14/week and <=4 drinks/occasion for males, <=7/weeks and <= 3 drinks/occasion for females) and that the risk for alcohol disorders and other health effects rises proportionally with the number of drinks per week and how often a drinker exceeds daily limits.  Discussed cessation/primary prevention of drug use and availability of treatment for abuse.   Diet: Encouraged to adjust caloric intake to maintain  or achieve ideal body weight, to reduce intake of dietary saturated fat and total fat, to limit sodium intake by avoiding high sodium foods and not adding table salt, and to maintain adequate dietary potassium and calcium preferably from fresh fruits, vegetables, and low-fat dairy products.    stressed the importance of regular exercise  Injury prevention: Discussed safety belts, safety helmets, smoke detector, smoking near bedding or upholstery.   Dental health: Discussed importance of regular tooth brushing, flossing, and dental visits.    NEXT PREVENTATIVE PHYSICAL DUE IN 1 YEAR. Return in about 2 weeks (around 10/28/2021), or lung recheck if not better.

## 2021-10-15 LAB — CBC WITH DIFFERENTIAL/PLATELET
Basophils Absolute: 0 10*3/uL (ref 0.0–0.2)
Basos: 0 %
EOS (ABSOLUTE): 0.1 10*3/uL (ref 0.0–0.4)
Eos: 2 %
Hematocrit: 45.3 % (ref 34.0–46.6)
Hemoglobin: 15.3 g/dL (ref 11.1–15.9)
Immature Grans (Abs): 0 10*3/uL (ref 0.0–0.1)
Immature Granulocytes: 0 %
Lymphocytes Absolute: 2.5 10*3/uL (ref 0.7–3.1)
Lymphs: 35 %
MCH: 31.5 pg (ref 26.6–33.0)
MCHC: 33.8 g/dL (ref 31.5–35.7)
MCV: 93 fL (ref 79–97)
Monocytes Absolute: 0.5 10*3/uL (ref 0.1–0.9)
Monocytes: 7 %
Neutrophils Absolute: 4 10*3/uL (ref 1.4–7.0)
Neutrophils: 56 %
Platelets: 147 10*3/uL — ABNORMAL LOW (ref 150–450)
RBC: 4.85 x10E6/uL (ref 3.77–5.28)
RDW: 12.1 % (ref 11.7–15.4)
WBC: 7.1 10*3/uL (ref 3.4–10.8)

## 2021-10-15 LAB — COMPREHENSIVE METABOLIC PANEL
ALT: 18 IU/L (ref 0–32)
AST: 27 IU/L (ref 0–40)
Albumin/Globulin Ratio: 1.6 (ref 1.2–2.2)
Albumin: 4.1 g/dL (ref 3.8–4.8)
Alkaline Phosphatase: 96 IU/L (ref 44–121)
BUN/Creatinine Ratio: 10 — ABNORMAL LOW (ref 12–28)
BUN: 8 mg/dL (ref 8–27)
Bilirubin Total: 0.2 mg/dL (ref 0.0–1.2)
CO2: 26 mmol/L (ref 20–29)
Calcium: 10 mg/dL (ref 8.7–10.3)
Chloride: 105 mmol/L (ref 96–106)
Creatinine, Ser: 0.77 mg/dL (ref 0.57–1.00)
Globulin, Total: 2.5 g/dL (ref 1.5–4.5)
Glucose: 107 mg/dL — ABNORMAL HIGH (ref 70–99)
Potassium: 4 mmol/L (ref 3.5–5.2)
Sodium: 148 mmol/L — ABNORMAL HIGH (ref 134–144)
Total Protein: 6.6 g/dL (ref 6.0–8.5)
eGFR: 83 mL/min/{1.73_m2} (ref >59)

## 2021-10-15 LAB — TSH: TSH: 3.14 u[IU]/mL (ref 0.450–4.500)

## 2021-10-15 LAB — HCV RNA QUANT: Hepatitis C Quantitation: NOT DETECTED IU/mL

## 2021-10-15 LAB — LIPID PANEL W/O CHOL/HDL RATIO
Cholesterol, Total: 155 mg/dL (ref 100–199)
HDL: 57 mg/dL (ref >39)
LDL Chol Calc (NIH): 62 mg/dL (ref 0–99)
Triglycerides: 224 mg/dL — ABNORMAL HIGH (ref 0–149)
VLDL Cholesterol Cal: 36 mg/dL (ref 5–40)

## 2021-10-17 ENCOUNTER — Encounter: Payer: Self-pay | Admitting: Family Medicine

## 2021-10-28 ENCOUNTER — Ambulatory Visit: Payer: 59 | Admitting: Family Medicine

## 2021-11-04 ENCOUNTER — Telehealth (INDEPENDENT_AMBULATORY_CARE_PROVIDER_SITE_OTHER): Payer: 59 | Admitting: Internal Medicine

## 2021-11-04 ENCOUNTER — Ambulatory Visit: Payer: Self-pay

## 2021-11-04 ENCOUNTER — Encounter: Payer: Self-pay | Admitting: Internal Medicine

## 2021-11-04 VITALS — Temp 99.1°F

## 2021-11-04 DIAGNOSIS — J441 Chronic obstructive pulmonary disease with (acute) exacerbation: Secondary | ICD-10-CM | POA: Diagnosis not present

## 2021-11-04 MED ORDER — DOXYCYCLINE HYCLATE 100 MG PO TABS: 100.0000 mg | ORAL_TABLET | Freq: Two times a day (BID) | ORAL | 0 refills | Status: AC

## 2021-11-04 MED ORDER — METHYLPREDNISOLONE 4 MG PO TBPK: ORAL_TABLET | ORAL | 0 refills | Status: DC

## 2021-11-04 NOTE — Progress Notes (Signed)
Temp 99.1 F (37.3 C) (Oral)    Subjective:    Patient ID: Kim Graham, female    DOB: Nov 22, 1950, 70 y.o.   MRN: [id number]  Chief Complaint  Patient presents with   Cough    All started on Saturday. Has been taking Tylenol.     Nasal Congestion   Altered Mental Status   Hemorrhoids   Generalized Body Aches   Genia Hotter out of bed on Sunday evening   Shortness of Breath    HPI: Kim Graham is a 69 y.o. female   This visit was completed via telephone due to the restrictions of the COVID-19 pandemic. All issues as above were discussed and addressed but no physical exam was performed. If it was felt that the patient should be evaluated in the office, they were directed there. The patient verbally consented to this visit. Patient was unable to complete an audio/visual visit due to Technical difficulties. Due to the catastrophic nature of the COVID-19 pandemic, this visit was done through audio contact only. Location of the patient: home Location of the provider: work Those involved with this call:  Provider: Loura Pardon, MD CMA: Tristan Schroeder, CMA Front Desk/Registration: [employer]  Time spent on call: 10 minutes on the phone discussing health concerns. 10 minutes total spent in review of patient's record and preparation of their chart.     Cough Chronicity: temp 99 .1 , c/o congestion. Associated symptoms include shortness of breath.  Shortness of Breath This is a new (albuterol helps is also on anoro.) problem. The current episode started in the past 7 days.   Chief Complaint  Patient presents with   Cough    All started on Saturday. Has been taking Tylenol.     Nasal Congestion   Altered Mental Status   Hemorrhoids   Generalized Body Aches   Genia Hotter out of bed on Sunday evening   Shortness of Breath    Relevant past medical, surgical, family and social history reviewed and updated as indicated. Interim medical history since our last visit  reviewed. Allergies and medications reviewed and updated.  Review of Systems  Respiratory:  Positive for cough and shortness of breath.    Per HPI unless specifically indicated above     Objective:    Temp 99.1 F (37.3 C) (Oral)   Wt Readings from Last 3 Encounters:  10/14/21 131 lb 3.2 oz (59.5 kg)  09/15/21 129 lb 12.8 oz (58.9 kg)  04/04/21 133 lb 9.6 oz (60.6 kg)    Physical Exam  Unable to peform sec to virtual visit.         Current Outpatient Medications:    albuterol (VENTOLIN HFA) 108 (90 Base) MCG/ACT inhaler, Inhale 2 puffs into the lungs every 6 (six) hours as needed for wheezing or shortness of breath., Disp: 8.5 g, Rfl: 2   ARIPiprazole (ABILIFY) 20 MG tablet, Take 1 tablet (20 mg total) by mouth daily., Disp: 90 tablet, Rfl: 1   conjugated estrogens (PREMARIN) vaginal cream, Place 0.25 Applicatorfuls vaginally daily., Disp: 42.5 g, Rfl: 11   doxycycline (VIBRA-TABS) 100 MG tablet, Take 1 tablet (100 mg total) by mouth 2 (two) times daily for 5 days., Disp: 10 tablet, Rfl: 0   ibuprofen (ADVIL) 600 MG tablet, Take 1 tablet (600 mg total) by mouth every 8 (eight) hours as needed., Disp: 15 tablet, Rfl: 0   METHADONE HCL PO, Take 83 mg by mouth daily.,  Disp: , Rfl:    methylPREDNISolone (MEDROL DOSEPAK) 4 MG TBPK tablet, Use as directed, Disp: 1 each, Rfl: 0   umeclidinium-vilanterol (ANORO ELLIPTA) 62.5-25 MCG/ACT AEPB, Inhale 1 puff into the lungs daily at 6 (six) AM., Disp: 1 each, Rfl: 12    Assessment & Plan:  Ac exacerbation of COPD:start pt on medrol dose pak/ doxycycline for such continue albuterol q 4-6 hrly as needed.  Checked COVID -ve at home  To come in and check for flu if symptoms do not get better with above rx  pt advised to take Tylenol q 4- 6 hourly as needed. pt to take allegra q pm as needed and to call office if symptoms worsened pt verbalised understanding of such.     Problem List Items Addressed This Visit       Respiratory   COPD  with acute exacerbation (HCC) - Primary   Relevant Medications   methylPREDNISolone (MEDROL DOSEPAK) 4 MG TBPK tablet     No orders of the defined types were placed in this encounter.    Meds ordered this encounter  Medications   methylPREDNISolone (MEDROL DOSEPAK) 4 MG TBPK tablet    Sig: Use as directed    Dispense:  1 each    Refill:  0   doxycycline (VIBRA-TABS) 100 MG tablet    Sig: Take 1 tablet (100 mg total) by mouth 2 (two) times daily for 5 days.    Dispense:  10 tablet    Refill:  0     Follow up plan: No follow-ups on file.

## 2021-11-04 NOTE — Telephone Encounter (Signed)
°  Chief Complaint: Cough Symptoms: Cough, runny nose,wheezing,shortness of breath Frequency: Started Saturday Pertinent Negatives: Patient denies fever Disposition: [] ED /[] Urgent Care (no appt availability in office) / [x] Appointment(In office/virtual)/ []  [facility]/ [] Home Care/ [] Refused Recommended Disposition  Additional Notes:    Reason for Disposition  [1] MILD difficulty breathing (e.g., minimal/no SOB at rest, SOB with walking, pulse <100) AND [2] still present when not coughing  Answer Assessment - Initial Assessment Questions 1. ONSET: "When did the cough begin?"      Saturday 2. SEVERITY: "How bad is the cough today?"      Severe 3. SPUTUM: "Describe the color of your sputum" (none, dry cough; clear, white, yellow, green)     Yellow 4. HEMOPTYSIS: "Are you coughing up any blood?" If so ask: "How much?" (flecks, streaks, tablespoons, etc.)     No 5. DIFFICULTY BREATHING: "Are you having difficulty breathing?" If Yes, ask: "How bad is it?" (e.g., mild, moderate, severe)    - MILD: No SOB at rest, mild SOB with walking, speaks normally in sentences, can lie down, no retractions, pulse < 100.    - MODERATE: SOB at rest, SOB with minimal exertion and prefers to sit, cannot lie down flat, speaks in phrases, mild retractions, audible wheezing, pulse 100-120.    - SEVERE: Very SOB at rest, speaks in single words, struggling to breathe, sitting hunched forward, retractions, pulse > 120      Moderate 6. FEVER: "Do you have a fever?" If Yes, ask: "What is your temperature, how was it measured, and when did it start?"     No 7. CARDIAC HISTORY: "Do you have any history of heart disease?" (e.g., heart attack, congestive heart failure)      No 8. LUNG HISTORY: "Do you have any history of lung disease?"  (e.g., pulmonary embolus, asthma, emphysema)     COPD 9. PE RISK FACTORS: "Do you have a history of blood clots?" (or: recent major surgery, recent prolonged travel,  bedridden)     No 10. OTHER SYMPTOMS: "Do you have any other symptoms?" (e.g., runny nose, wheezing, chest pain)       Runny nose, wheezing 11. PREGNANCY: "Is there any chance you are pregnant?" "When was your last menstrual period?"       No 12. TRAVEL: "Have you traveled out of the country in the last month?" (e.g., travel history, exposures)       No  Protocols used: Cough - Acute Productive-A-AH

## 2021-11-06 ENCOUNTER — Telehealth (INDEPENDENT_AMBULATORY_CARE_PROVIDER_SITE_OTHER): Payer: 59 | Admitting: Family Medicine

## 2021-11-06 ENCOUNTER — Ambulatory Visit: Payer: Self-pay | Admitting: *Deleted

## 2021-11-06 ENCOUNTER — Encounter: Payer: Self-pay | Admitting: Family Medicine

## 2021-11-06 DIAGNOSIS — J441 Chronic obstructive pulmonary disease with (acute) exacerbation: Secondary | ICD-10-CM | POA: Diagnosis not present

## 2021-11-06 MED ORDER — PREDNISONE 10 MG PO TABS: ORAL_TABLET | ORAL | 0 refills | Status: DC

## 2021-11-06 NOTE — Progress Notes (Signed)
SpO2 97%    Subjective:    Patient ID: Kim Graham, female    DOB: 17-Jun-1951, 70 y.o.   MRN: [id number]  HPI: Kim Graham is a 70 y.o. female  Chief Complaint  Patient presents with   Cough    Patient states she is getting a little better but not how she would expect. Patient states she is feeling slight chest pain.    UPPER RESPIRATORY TRACT INFECTION Duration: a few days, not getting better on her doxy and medrol dose pak Worst symptom: cough and SOB Fever: no Cough: yes Shortness of breath: yes Wheezing: yes Chest pain: yes Chest tightness: yes Chest congestion: yes Nasal congestion: yes Runny nose: yes Post nasal drip: yes Sneezing: yes Sore throat: no Swollen glands: no Sinus pressure: yes Headache: yes Face pain: no Toothache: no Ear pain: no  Ear pressure: no  Eyes red/itching:no Eye drainage/crusting: no  Vomiting: no Rash: no Fatigue: yes Sick contacts: yes Strep contacts: no  Context: stable Recurrent sinusitis: no Relief with OTC cold/cough medications: no  Treatments attempted: medrol dose pak and doxycycline   Relevant past medical, surgical, family and social history reviewed and updated as indicated. Interim medical history since our last visit reviewed. Allergies and medications reviewed and updated.  Review of Systems  Constitutional: Negative.   HENT: Negative.    Respiratory:  Positive for cough, chest tightness, shortness of breath and wheezing. Negative for apnea, choking and stridor.   Cardiovascular: Negative.   Gastrointestinal: Negative.   Musculoskeletal: Negative.   Psychiatric/Behavioral: Negative.     Per HPI unless specifically indicated above     Objective:    SpO2 97%   Wt Readings from Last 3 Encounters:  10/14/21 131 lb 3.2 oz (59.5 kg)  09/15/21 129 lb 12.8 oz (58.9 kg)  04/04/21 133 lb 9.6 oz (60.6 kg)    Physical Exam Vitals and nursing note reviewed.  Pulmonary:     Effort: Pulmonary effort is  normal. No respiratory distress.     Comments: Speaking in full sentences Neurological:     Mental Status: She is alert.  Psychiatric:        Mood and Affect: Mood normal.        Behavior: Behavior normal.        Thought Content: Thought content normal.        Judgment: Judgment normal.    Results for orders placed or performed in visit on 10/14/21  Microscopic Examination   BLD  Result Value Ref Range   WBC, UA 6-10 (A) 0 - 5 /hpf   RBC 11-30 (A) 0 - 2 /hpf   Epithelial Cells (non renal) 0-10 0 - 10 /hpf   Bacteria, UA Few None seen/Few  Bayer DCA Hb A1c Waived  Result Value Ref Range   HB A1C (BAYER DCA - WAIVED) 5.8 (H) 4.8 - 5.6 %  CBC with Differential/Platelet  Result Value Ref Range   WBC 7.1 3.4 - 10.8 x10E3/uL   RBC 4.85 3.77 - 5.28 x10E6/uL   Hemoglobin 15.3 11.1 - 15.9 g/dL   Hematocrit 66.1 68.1 - 46.6 %   MCV 93 79 - 97 fL   MCH 31.5 26.6 - 33.0 pg   MCHC 33.8 31.5 - 35.7 g/dL   RDW 58.1 00.4 - 22.4 %   Platelets 147 (L) 150 - 450 x10E3/uL   Neutrophils 56 Not Estab. %   Lymphs 35 Not Estab. %   Monocytes 7 Not Estab. %  Eos 2 Not Estab. %   Basos 0 Not Estab. %   Neutrophils Absolute 4.0 1.4 - 7.0 x10E3/uL   Lymphocytes Absolute 2.5 0.7 - 3.1 x10E3/uL   Monocytes Absolute 0.5 0.1 - 0.9 x10E3/uL   EOS (ABSOLUTE) 0.1 0.0 - 0.4 x10E3/uL   Basophils Absolute 0.0 0.0 - 0.2 x10E3/uL   Immature Granulocytes 0 Not Estab. %   Immature Grans (Abs) 0.0 0.0 - 0.1 x10E3/uL  Comprehensive metabolic panel  Result Value Ref Range   Glucose 107 (H) 70 - 99 mg/dL   BUN 8 8 - 27 mg/dL   Creatinine, Ser 0.77 0.57 - 1.00 mg/dL   eGFR 83 >59 mL/min/1.73   BUN/Creatinine Ratio 10 (L) 12 - 28   Sodium 148 (H) 134 - 144 mmol/L   Potassium 4.0 3.5 - 5.2 mmol/L   Chloride 105 96 - 106 mmol/L   CO2 26 20 - 29 mmol/L   Calcium 10.0 8.7 - 10.3 mg/dL   Total Protein 6.6 6.0 - 8.5 g/dL   Albumin 4.1 3.8 - 4.8 g/dL   Globulin, Total 2.5 1.5 - 4.5 g/dL   Albumin/Globulin  Ratio 1.6 1.2 - 2.2   Bilirubin Total 0.2 0.0 - 1.2 mg/dL   Alkaline Phosphatase 96 44 - 121 IU/L   AST 27 0 - 40 IU/L   ALT 18 0 - 32 IU/L  Lipid Panel w/o Chol/HDL Ratio  Result Value Ref Range   Cholesterol, Total 155 100 - 199 mg/dL   Triglycerides 224 (H) 0 - 149 mg/dL   HDL 57 >39 mg/dL   VLDL Cholesterol Cal 36 5 - 40 mg/dL   LDL Chol Calc (NIH) 62 0 - 99 mg/dL  Urinalysis, Routine w reflex microscopic  Result Value Ref Range   Specific Gravity, UA 1.015 1.005 - 1.030   pH, UA 6.0 5.0 - 7.5   Color, UA Yellow Yellow   Appearance Ur Cloudy (A) Clear   Leukocytes,UA 1+ (A) Negative   Protein,UA Trace (A) Negative/Trace   Glucose, UA Negative Negative   Ketones, UA Negative Negative   RBC, UA 3+ (A) Negative   Bilirubin, UA Negative Negative   Urobilinogen, Ur 0.2 0.2 - 1.0 mg/dL   Nitrite, UA Negative Negative   Microscopic Examination See below:   TSH  Result Value Ref Range   TSH 3.140 0.450 - 4.500 uIU/mL  Microalbumin, Urine Waived  Result Value Ref Range   Microalb, Ur Waived 30 (H) 0 - 19 mg/L   Creatinine, Urine Waived 100 10 - 300 mg/dL   Microalb/Creat Ratio <30 <30 mg/g  HCV RNA quant  Result Value Ref Range   Hepatitis C Quantitation HCV Not Detected IU/mL   Test Information Comment       Assessment & Plan:   Problem List Items Addressed This Visit       Respiratory   COPD with acute exacerbation ([place]) - Primary    Will change from medrol dose pack to prednisone. Call if not getting better or getting worse. Warning signs to go to ER discussed.       Relevant Medications   predniSONE (DELTASONE) 10 MG tablet     Follow up plan: Return 10 days lung recheck.   This visit was completed via telephone due to the restrictions of the COVID-19 pandemic. All issues as above were discussed and addressed but no physical exam was performed. If it was felt that the patient should be evaluated in the office, they were directed there. The  patient verbally  consented to this visit. Patient was unable to complete an audio/visual visit due to Lack of equipment. Due to the catastrophic nature of the COVID-19 pandemic, this visit was done through audio contact only. Location of the patient: home Location of the provider: work Those involved with this call:  Provider: Park Liter, DO CMA: Louanna Raw, CMA Front Desk/Registration: [employer]  Time spent on call:  15 minutes on the phone discussing health concerns. 23 minutes total spent in review of patient's record and preparation of their chart.

## 2021-11-06 NOTE — Assessment & Plan Note (Signed)
Will change from medrol dose pack to prednisone. Call if not getting better or getting worse. Warning signs to go to ER discussed.

## 2021-11-06 NOTE — Telephone Encounter (Signed)
"  Patient called in stated that she had a virtual visit a few days ago and now she is feeling worst say that she is feeling pain in her chest and getting weaker each day and feeling terrible. Please call Ph# [phone] ."     Chief Complaint: Chest pain, right sided. Weakness Symptoms: Constant right sided chest pain,cough,SOB at HS,weak,confused at times Frequency: Onset yesterday Pertinent Negatives: Patient denies  Disposition: [] ED /[] Urgent Care (no appt availability in office) / [] Appointment(In office/virtual)/ []  [place] [facility]/ [] Home Care/ [] Refused Recommended Disposition  Additional Notes: Seen 11/04/21 started on Steroid pack and Doxy. States feels worse, told to call if no improvement. Advised may need ED eval. Called practice, will route to practice for PCPs review.             Reason for Disposition  Patient sounds very sick or weak to the triager  Answer Assessment - Initial Assessment Questions 1. LOCATION: "Where does it hurt?"       right breast area 2. RADIATION: "Does the pain go anywhere else?" (e.g., into neck, jaw, arms, back)     no 3. ONSET: "When did the chest pain begin?" (Minutes, hours or days)      Yesterday 4. PATTERN "Does the pain come and go, or has it been constant since it started?"  "Does it get worse with exertion?"      Constant 5. DURATION: "How long does it last" (e.g., seconds, minutes, hours)     Constant 6. SEVERITY: "How bad is the pain?"  (e.g., Scale 1-10; mild, moderate, or severe)    - MILD (1-3): doesn't interfere with normal activities     - MODERATE (4-7): interferes with normal activities or awakens from sleep    - SEVERE (8-10): excruciating pain, unable to do any normal activities       7/10 7. CARDIAC RISK FACTORS: "Do you have any history of heart problems or risk factors for heart disease?" (e.g., angina, prior heart attack; diabetes, high blood pressure, high cholesterol, smoker, or strong family history  of heart disease)      8. PULMONARY RISK FACTORS: "Do you have any history of lung disease?"  (e.g., blood clots in lung, asthma, emphysema, birth control pills)     yes 9. CAUSE: "What do you think is causing the chest pain?"     Unsure 10. OTHER SYMPTOMS: "Do you have any other symptoms?" (e.g., dizziness, nausea, vomiting, sweating, fever, difficulty breathing, cough)       Very weak, cough, dizziness, confused at times onset 2 days ago, SOB with exertion, at HS. Props up.  Protocols used: Chest Pain-A-AH

## 2021-12-08 ENCOUNTER — Telehealth: Payer: Self-pay | Admitting: Family Medicine

## 2021-12-08 ENCOUNTER — Encounter: Payer: Self-pay | Admitting: Nurse Practitioner

## 2021-12-08 ENCOUNTER — Telehealth: Payer: Self-pay

## 2021-12-08 ENCOUNTER — Ambulatory Visit (INDEPENDENT_AMBULATORY_CARE_PROVIDER_SITE_OTHER): Payer: 59 | Admitting: Nurse Practitioner

## 2021-12-08 ENCOUNTER — Other Ambulatory Visit: Payer: Self-pay

## 2021-12-08 ENCOUNTER — Ambulatory Visit: Payer: Self-pay | Admitting: *Deleted

## 2021-12-08 ENCOUNTER — Ambulatory Visit
Admission: RE | Admit: 2021-12-08 | Discharge: 2021-12-08 | Disposition: A | Discharge status: Home / Self Care | Payer: 59 | Source: Ambulatory Visit | Attending: Nurse Practitioner | Admitting: Nurse Practitioner

## 2021-12-08 VITALS — BP 100/65 | HR 109 | Temp 97.9°F | Ht 62.01 in | Wt 123.4 lb

## 2021-12-08 DIAGNOSIS — R1011 Right upper quadrant pain: Secondary | ICD-10-CM

## 2021-12-08 DIAGNOSIS — K810 Acute cholecystitis: Secondary | ICD-10-CM | POA: Diagnosis not present

## 2021-12-08 DIAGNOSIS — K8000 Calculus of gallbladder with acute cholecystitis without obstruction: Secondary | ICD-10-CM | POA: Diagnosis not present

## 2021-12-08 LAB — POCT I-STAT CREATININE: Creatinine, Ser: 1.5 mg/dL — ABNORMAL HIGH (ref 0.44–1.00)

## 2021-12-08 MED ORDER — IOHEXOL 300 MG/ML  SOLN
100.0000 mL | Freq: Once | INTRAMUSCULAR | Status: AC | PRN
  Administered 2021-12-08: 75 mL via INTRAVENOUS

## 2021-12-08 NOTE — Telephone Encounter (Signed)
Received a call from radiology regarding results of CT which showed cholecystits as well as ?stone in ureter without hydronephrosis on the R side. Called patient to advise her to go to ER. No answer. LMOM to call back. If patient calls back- please tell her that she has cholecystitis and direct her to go to ER. Thanks.

## 2021-12-08 NOTE — Telephone Encounter (Signed)
Reason for Disposition  [1] MILD-MODERATE pain AND [2] constant AND [3] present > 2 hours  Answer Assessment - Initial Assessment Questions 1. LOCATION: "Where does it hurt?"      Both sides, mostly under right under breast 2. RADIATION: "Does the pain shoot anywhere else?" (e.g., chest, back)     Right side of back 3. ONSET: "When did the pain begin?" (e.g., minutes, hours or days ago)      Saturday 4. SUDDEN: "Gradual or sudden onset?"     Suddenly 5. PATTERN "Does the pain come and go, or is it constant?"    - If constant: "Is it getting better, staying the same, or worsening?"      (Note: Constant means the pain never goes away completely; most serious pain is constant and it progresses)     - If intermittent: "How long does it last?" "Do you have pain now?"     (Note: Intermittent means the pain goes away completely between bouts)     Constant 6. SEVERITY: "How bad is the pain?"  (e.g., Scale 1-10; mild, moderate, or severe)    - MILD (1-3): doesn't interfere with normal activities, abdomen soft and not tender to touch     - MODERATE (4-7): interferes with normal activities or awakens from sleep, abdomen tender to touch     - SEVERE (8-10): excruciating pain, doubled over, unable to do any normal activities       7/10, "Tylenol helps" 7. RECURRENT SYMPTOM: "Have you ever had this type of stomach pain before?" If Yes, ask: "When was the last time?" and "What happened that time?"      no 8. AGGRAVATING FACTORS: "Does anything seem to cause this pain?" (e.g., foods, stress, alcohol)     No 9. CARDIAC SYMPTOMS: "Do you have any of the following symptoms: chest pain, difficulty breathing, sweating, nausea?"     No 10. OTHER SYMPTOMS: "Do you have any other symptoms?" (e.g., back pain, diarrhea, fever, urination pain, vomiting)       None, not eating. Tylenol helps  Protocols used: Abdominal Pain - Upper-A-AH

## 2021-12-08 NOTE — Telephone Encounter (Signed)
Ruby from [facility] calling to speak with on call provider per request of Dr. Allena Katz Radiologist. On call provider was contacted and connected with call.

## 2021-12-08 NOTE — Telephone Encounter (Signed)
°  Chief Complaint: Upper abdominal pain Symptoms: Under breasts, right side > left. RAdiates to back at times Frequency: Onset 3 days ago Pertinent Negatives: Patient denies fever, nausea, vomiting, diarrhea. No rash. States tylenol helps. Disposition: [] ED /[] Urgent Care (no appt availability in office) / [x] Appointment(In office/virtual)/ []  [place] [facility]/ [] Home Care/ [] Refused Recommended Disposition /[] [place] [facility]/ []  Follow-up with PCP Additional Notes: Appt for today scheduled prior to triage. Advised ED for ANY worsening symptoms.

## 2021-12-08 NOTE — Progress Notes (Signed)
BP 100/65    Pulse (!) 109    Temp 97.9 F (36.6 C) (Oral)    Ht 5' 2.01" (1.575 m)    Wt 123 lb 6.4 oz (56 kg)    SpO2 93%    BMI 22.56 kg/m    Subjective:    Patient ID: Kim Graham, female    DOB: Feb 18, 1951, 71 y.o.   MRN: [id number]  HPI: Kim Graham is a 71 y.o. female  Chief Complaint  Patient presents with   Abdominal Pain    Left side pain, started on Saturday afternoon.    Patient states she started having pain on her left side of her abdomen.  She was just sitting in her chair and the pain started.  ABDOMINAL ISSUES Duration: days Petra Kuba: sharp Location: RUQ  Severity: 5/10  Radiation: yes Episode duration: Frequency: constant Alleviating factors: Aggravating factors: Treatments attempted:  motrin Constipation: no Diarrhea: no Episodes of diarrhea/day: Mucous in the stool: no Heartburn: no Bloating:no Flatulence: no Nausea: no Vomiting: no Episodes of vomit/day: Melena or hematochezia: no Rash: no Jaundice: no Fever: no Weight loss: no  Relevant past medical, surgical, family and social history reviewed and updated as indicated. Interim medical history since our last visit reviewed. Allergies and medications reviewed and updated.  Review of Systems  Constitutional:  Negative for fever.  Gastrointestinal:  Positive for abdominal distention and abdominal pain. Negative for blood in stool, diarrhea, nausea and vomiting.   Per HPI unless specifically indicated above     Objective:    BP 100/65    Pulse (!) 109    Temp 97.9 F (36.6 C) (Oral)    Ht 5' 2.01" (1.575 m)    Wt 123 lb 6.4 oz (56 kg)    SpO2 93%    BMI 22.56 kg/m   Wt Readings from Last 3 Encounters:  12/08/21 123 lb 6.4 oz (56 kg)  10/14/21 131 lb 3.2 oz (59.5 kg)  09/15/21 129 lb 12.8 oz (58.9 kg)    Physical Exam Vitals and nursing note reviewed.  Constitutional:      General: She is not in acute distress.    Appearance: Normal appearance. She is normal weight. She is not  ill-appearing, toxic-appearing or diaphoretic.  HENT:     Head: Normocephalic.     Right Ear: External ear normal.     Left Ear: External ear normal.     Nose: Nose normal.     Mouth/Throat:     Mouth: Mucous membranes are moist.     Pharynx: Oropharynx is clear.  Eyes:     General:        Right eye: No discharge.        Left eye: No discharge.     Extraocular Movements: Extraocular movements intact.     Conjunctiva/sclera: Conjunctivae normal.     Pupils: Pupils are equal, round, and reactive to light.  Cardiovascular:     Rate and Rhythm: Normal rate and regular rhythm.     Heart sounds: No murmur heard. Pulmonary:     Effort: Pulmonary effort is normal. No respiratory distress.     Breath sounds: Normal breath sounds. No wheezing or rales.  Abdominal:     General: Bowel sounds are normal. There is distension.     Tenderness: There is abdominal tenderness in the right upper quadrant and left lower quadrant.  Musculoskeletal:     Cervical back: Normal range of motion and neck supple.  Skin:  General: Skin is warm and dry.     Capillary Refill: Capillary refill takes less than 2 seconds.  Neurological:     General: No focal deficit present.     Mental Status: She is alert and oriented to person, place, and time. Mental status is at baseline.  Psychiatric:        Mood and Affect: Mood normal.        Behavior: Behavior normal.        Thought Content: Thought content normal.        Judgment: Judgment normal.    Results for orders placed or performed in visit on 10/14/21  Microscopic Examination   BLD  Result Value Ref Range   WBC, UA 6-10 (A) 0 - 5 /hpf   RBC 11-30 (A) 0 - 2 /hpf   Epithelial Cells (non renal) 0-10 0 - 10 /hpf   Bacteria, UA Few None seen/Few  Bayer DCA Hb A1c Waived  Result Value Ref Range   HB A1C (BAYER DCA - WAIVED) 5.8 (H) 4.8 - 5.6 %  CBC with Differential/Platelet  Result Value Ref Range   WBC 7.1 3.4 - 10.8 x10E3/uL   RBC 4.85 3.77 - 5.28  x10E6/uL   Hemoglobin 15.3 11.1 - 15.9 g/dL   Hematocrit 45.3 34.0 - 46.6 %   MCV 93 79 - 97 fL   MCH 31.5 26.6 - 33.0 pg   MCHC 33.8 31.5 - 35.7 g/dL   RDW 12.1 11.7 - 15.4 %   Platelets 147 (L) 150 - 450 x10E3/uL   Neutrophils 56 Not Estab. %   Lymphs 35 Not Estab. %   Monocytes 7 Not Estab. %   Eos 2 Not Estab. %   Basos 0 Not Estab. %   Neutrophils Absolute 4.0 1.4 - 7.0 x10E3/uL   Lymphocytes Absolute 2.5 0.7 - 3.1 x10E3/uL   Monocytes Absolute 0.5 0.1 - 0.9 x10E3/uL   EOS (ABSOLUTE) 0.1 0.0 - 0.4 x10E3/uL   Basophils Absolute 0.0 0.0 - 0.2 x10E3/uL   Immature Granulocytes 0 Not Estab. %   Immature Grans (Abs) 0.0 0.0 - 0.1 x10E3/uL  Comprehensive metabolic panel  Result Value Ref Range   Glucose 107 (H) 70 - 99 mg/dL   BUN 8 8 - 27 mg/dL   Creatinine, Ser 0.77 0.57 - 1.00 mg/dL   eGFR 83 >59 mL/min/1.73   BUN/Creatinine Ratio 10 (L) 12 - 28   Sodium 148 (H) 134 - 144 mmol/L   Potassium 4.0 3.5 - 5.2 mmol/L   Chloride 105 96 - 106 mmol/L   CO2 26 20 - 29 mmol/L   Calcium 10.0 8.7 - 10.3 mg/dL   Total Protein 6.6 6.0 - 8.5 g/dL   Albumin 4.1 3.8 - 4.8 g/dL   Globulin, Total 2.5 1.5 - 4.5 g/dL   Albumin/Globulin Ratio 1.6 1.2 - 2.2   Bilirubin Total 0.2 0.0 - 1.2 mg/dL   Alkaline Phosphatase 96 44 - 121 IU/L   AST 27 0 - 40 IU/L   ALT 18 0 - 32 IU/L  Lipid Panel w/o Chol/HDL Ratio  Result Value Ref Range   Cholesterol, Total 155 100 - 199 mg/dL   Triglycerides 224 (H) 0 - 149 mg/dL   HDL 57 >39 mg/dL   VLDL Cholesterol Cal 36 5 - 40 mg/dL   LDL Chol Calc (NIH) 62 0 - 99 mg/dL  Urinalysis, Routine w reflex microscopic  Result Value Ref Range   Specific Gravity, UA 1.015 1.005 - 1.030  pH, UA 6.0 5.0 - 7.5   Color, UA Yellow Yellow   Appearance Ur Cloudy (A) Clear   Leukocytes,UA 1+ (A) Negative   Protein,UA Trace (A) Negative/Trace   Glucose, UA Negative Negative   Ketones, UA Negative Negative   RBC, UA 3+ (A) Negative   Bilirubin, UA Negative Negative    Urobilinogen, Ur 0.2 0.2 - 1.0 mg/dL   Nitrite, UA Negative Negative   Microscopic Examination See below:   TSH  Result Value Ref Range   TSH 3.140 0.450 - 4.500 uIU/mL  Microalbumin, Urine Waived  Result Value Ref Range   Microalb, Ur Waived 30 (H) 0 - 19 mg/L   Creatinine, Urine Waived 100 10 - 300 mg/dL   Microalb/Creat Ratio <30 <30 mg/g  HCV RNA quant  Result Value Ref Range   Hepatitis C Quantitation HCV Not Detected IU/mL   Test Information Comment       Assessment & Plan:   Problem List Items Addressed This Visit   None Visit Diagnoses     RUQ abdominal pain    -  Primary   Suspect cholecystis.  Not able to eat or drink much other than water. Abdomen distended. Will order labs and STAT CT. Will make recommendations based on results   Relevant Orders   Comp Met (CMET)   CBC w/Diff   Amylase   Lipase   CT Abdomen Pelvis W Contrast        Follow up plan: No follow-ups on file.

## 2021-12-08 NOTE — Telephone Encounter (Signed)
Called pt and read note from Dr. Laural Benes.  Pt is already in her pajamas and will not be going to the ER this evening. She will go in the morning.   I expressed concern, however pt will not go this evening. Pt states she is fine right now.  Pt will go if she has any problems overnight.  Reminded her that the ED is open all night.

## 2021-12-08 NOTE — Telephone Encounter (Signed)
Called pt regarding note from Dr. Laural Benes. LMOM to return call.

## 2021-12-09 ENCOUNTER — Other Ambulatory Visit: Payer: Self-pay | Admitting: Urology

## 2021-12-09 ENCOUNTER — Other Ambulatory Visit: Payer: Self-pay

## 2021-12-09 ENCOUNTER — Observation Stay: Payer: 59 | Admitting: Anesthesiology

## 2021-12-09 ENCOUNTER — Encounter: Payer: Self-pay | Admitting: Emergency Medicine

## 2021-12-09 ENCOUNTER — Encounter
Admission: EM | Disposition: A | Discharge status: Home / Self Care | Payer: Self-pay | Source: Home / Self Care | Attending: General Surgery

## 2021-12-09 ENCOUNTER — Inpatient Hospital Stay
Admission: EM | Admit: 2021-12-09 | Discharge: 2021-12-13 | DRG: 418 | Disposition: A | Discharge status: Home / Self Care | Payer: 59 | Attending: General Surgery | Admitting: General Surgery

## 2021-12-09 ENCOUNTER — Observation Stay: Payer: 59

## 2021-12-09 DIAGNOSIS — F17210 Nicotine dependence, cigarettes, uncomplicated: Secondary | ICD-10-CM | POA: Diagnosis present

## 2021-12-09 DIAGNOSIS — N202 Calculus of kidney with calculus of ureter: Secondary | ICD-10-CM | POA: Diagnosis not present

## 2021-12-09 DIAGNOSIS — Y838 Other surgical procedures as the cause of abnormal reaction of the patient, or of later complication, without mention of misadventure at the time of the procedure: Secondary | ICD-10-CM | POA: Diagnosis not present

## 2021-12-09 DIAGNOSIS — K810 Acute cholecystitis: Secondary | ICD-10-CM

## 2021-12-09 DIAGNOSIS — K9189 Other postprocedural complications and disorders of digestive system: Secondary | ICD-10-CM | POA: Diagnosis not present

## 2021-12-09 DIAGNOSIS — K8000 Calculus of gallbladder with acute cholecystitis without obstruction: Principal | ICD-10-CM | POA: Diagnosis present

## 2021-12-09 DIAGNOSIS — Z833 Family history of diabetes mellitus: Secondary | ICD-10-CM

## 2021-12-09 DIAGNOSIS — K660 Peritoneal adhesions (postprocedural) (postinfection): Secondary | ICD-10-CM | POA: Diagnosis present

## 2021-12-09 DIAGNOSIS — N201 Calculus of ureter: Secondary | ICD-10-CM | POA: Diagnosis not present

## 2021-12-09 DIAGNOSIS — Z8249 Family history of ischemic heart disease and other diseases of the circulatory system: Secondary | ICD-10-CM

## 2021-12-09 DIAGNOSIS — Z20822 Contact with and (suspected) exposure to covid-19: Secondary | ICD-10-CM | POA: Diagnosis present

## 2021-12-09 DIAGNOSIS — N200 Calculus of kidney: Secondary | ICD-10-CM

## 2021-12-09 DIAGNOSIS — J449 Chronic obstructive pulmonary disease, unspecified: Secondary | ICD-10-CM | POA: Diagnosis present

## 2021-12-09 DIAGNOSIS — E119 Type 2 diabetes mellitus without complications: Secondary | ICD-10-CM | POA: Diagnosis present

## 2021-12-09 DIAGNOSIS — Z79899 Other long term (current) drug therapy: Secondary | ICD-10-CM

## 2021-12-09 DIAGNOSIS — N132 Hydronephrosis with renal and ureteral calculous obstruction: Secondary | ICD-10-CM | POA: Diagnosis present

## 2021-12-09 HISTORY — DX: Acute cholecystitis: K81.0

## 2021-12-09 HISTORY — PX: CYSTOSCOPY W/ URETERAL STENT PLACEMENT: SHX1429

## 2021-12-09 LAB — URINALYSIS, COMPLETE (UACMP) WITH MICROSCOPIC
Bacteria, UA: NONE SEEN
Glucose, UA: NEGATIVE mg/dL
Nitrite: NEGATIVE
RBC / HPF: >50 RBC/hpf (ref 0–5)
Specific Gravity, Urine: 1.02 (ref 1.005–1.030)
pH: 5 (ref 5.0–8.0)

## 2021-12-09 LAB — COMPREHENSIVE METABOLIC PANEL
ALT: 14 IU/L (ref 0–32)
ALT: 16 U/L (ref 0–44)
AST: 21 IU/L (ref 0–40)
AST: 24 U/L (ref 15–41)
Albumin/Globulin Ratio: 1.4 (ref 1.2–2.2)
Albumin: 3.9 g/dL (ref 3.8–4.8)
Albumin: 3.7 g/dL (ref 3.5–5.0)
Alkaline Phosphatase: 86 IU/L (ref 44–121)
Alkaline Phosphatase: 73 U/L (ref 38–126)
Anion gap: 11 (ref 5–15)
BUN/Creatinine Ratio: 16 (ref 12–28)
BUN: 22 mg/dL (ref 8–27)
BUN: 28 mg/dL — ABNORMAL HIGH (ref 8–23)
Bilirubin Total: 0.5 mg/dL (ref 0.0–1.2)
CO2: 24 mmol/L (ref 20–29)
CO2: 24 mmol/L (ref 22–32)
Calcium: 9.2 mg/dL (ref 8.7–10.3)
Calcium: 9.1 mg/dL (ref 8.9–10.3)
Chloride: 93 mmol/L — ABNORMAL LOW (ref 96–106)
Chloride: 101 mmol/L (ref 98–111)
Creatinine, Ser: 1.37 mg/dL — ABNORMAL HIGH (ref 0.57–1.00)
Creatinine, Ser: 0.86 mg/dL (ref 0.44–1.00)
GFR, Estimated: >60 mL/min (ref >60)
Globulin, Total: 2.7 g/dL (ref 1.5–4.5)
Glucose, Bld: 109 mg/dL — ABNORMAL HIGH (ref 70–99)
Glucose: 101 mg/dL — ABNORMAL HIGH (ref 70–99)
Potassium: 4.3 mmol/L (ref 3.5–5.2)
Potassium: 4.3 mmol/L (ref 3.5–5.1)
Sodium: 142 mmol/L (ref 134–144)
Sodium: 136 mmol/L (ref 135–145)
Total Bilirubin: 0.8 mg/dL (ref 0.3–1.2)
Total Protein: 6.6 g/dL (ref 6.0–8.5)
Total Protein: 7 g/dL (ref 6.5–8.1)
eGFR: 42 mL/min/{1.73_m2} — ABNORMAL LOW (ref >59)

## 2021-12-09 LAB — CBC
HCT: 46.1 % — ABNORMAL HIGH (ref 36.0–46.0)
Hemoglobin: 15.2 g/dL — ABNORMAL HIGH (ref 12.0–15.0)
MCH: 31.3 pg (ref 26.0–34.0)
MCHC: 33 g/dL (ref 30.0–36.0)
MCV: 94.9 fL (ref 80.0–100.0)
Platelets: 203 10*3/uL (ref 150–400)
RBC: 4.86 MIL/uL (ref 3.87–5.11)
RDW: 14.6 % (ref 11.5–15.5)
WBC: 13 10*3/uL — ABNORMAL HIGH (ref 4.0–10.5)
nRBC: 0 % (ref 0.0–0.2)

## 2021-12-09 LAB — CBC WITH DIFFERENTIAL/PLATELET
Basophils Absolute: 0.1 10*3/uL (ref 0.0–0.2)
Basos: 0 %
EOS (ABSOLUTE): 0.1 10*3/uL (ref 0.0–0.4)
Eos: 1 %
Hematocrit: 46.8 % — ABNORMAL HIGH (ref 34.0–46.6)
Hemoglobin: 15.8 g/dL (ref 11.1–15.9)
Immature Grans (Abs): 0.1 10*3/uL (ref 0.0–0.1)
Immature Granulocytes: 1 %
Lymphocytes Absolute: 3.2 10*3/uL — ABNORMAL HIGH (ref 0.7–3.1)
Lymphs: 19 %
MCH: 31 pg (ref 26.6–33.0)
MCHC: 33.8 g/dL (ref 31.5–35.7)
MCV: 92 fL (ref 79–97)
Monocytes Absolute: 2.3 10*3/uL — ABNORMAL HIGH (ref 0.1–0.9)
Monocytes: 14 %
Neutrophils Absolute: 10.6 10*3/uL — ABNORMAL HIGH (ref 1.4–7.0)
Neutrophils: 65 %
Platelets: 224 10*3/uL (ref 150–450)
RBC: 5.09 x10E6/uL (ref 3.77–5.28)
RDW: 13.4 % (ref 11.7–15.4)
WBC: 16.2 10*3/uL — ABNORMAL HIGH (ref 3.4–10.8)

## 2021-12-09 LAB — LIPASE: Lipase: 19 U/L (ref 14–72)

## 2021-12-09 LAB — RESP PANEL BY RT-PCR (FLU A&B, COVID) ARPGX2
Influenza A by PCR: NEGATIVE
Influenza B by PCR: NEGATIVE
SARS Coronavirus 2 by RT PCR: NEGATIVE

## 2021-12-09 LAB — LIPASE, BLOOD: Lipase: 26 U/L (ref 11–51)

## 2021-12-09 LAB — CBG MONITORING, ED: Glucose-Capillary: 149 mg/dL — ABNORMAL HIGH (ref 70–99)

## 2021-12-09 LAB — AMYLASE: Amylase: 78 U/L (ref 31–110)

## 2021-12-09 SURGERY — CHOLECYSTECTOMY, ROBOT-ASSISTED, LAPAROSCOPIC
Anesthesia: General | Laterality: Right

## 2021-12-09 MED ORDER — DEXAMETHASONE SODIUM PHOSPHATE 10 MG/ML IJ SOLN
INTRAMUSCULAR | Status: DC | PRN
  Administered 2021-12-09: 5 mg via INTRAVENOUS

## 2021-12-09 MED ORDER — OXYCODONE HCL 5 MG/5ML PO SOLN: 5.0000 mg | Freq: Once | ORAL | Status: AC | PRN

## 2021-12-09 MED ORDER — KETAMINE HCL 50 MG/5ML IJ SOSY
PREFILLED_SYRINGE | INTRAMUSCULAR | Status: AC
  Filled 2021-12-09: qty 5

## 2021-12-09 MED ORDER — MORPHINE SULFATE (PF) 4 MG/ML IV SOLN: 4.0000 mg | INTRAVENOUS | Status: DC | PRN

## 2021-12-09 MED ORDER — CEFAZOLIN SODIUM-DEXTROSE 2-3 GM-%(50ML) IV SOLR
INTRAVENOUS | Status: DC | PRN
Start: 2021-12-09 — End: 2021-12-09
  Administered 2021-12-09: 2 g via INTRAVENOUS

## 2021-12-09 MED ORDER — SUGAMMADEX SODIUM 500 MG/5ML IV SOLN
INTRAVENOUS | Status: DC | PRN
  Administered 2021-12-09: 150 mg via INTRAVENOUS

## 2021-12-09 MED ORDER — PHENYLEPHRINE HCL (PRESSORS) 10 MG/ML IV SOLN
INTRAVENOUS | Status: AC
  Filled 2021-12-09: qty 1

## 2021-12-09 MED ORDER — LIDOCAINE HCL (CARDIAC) PF 100 MG/5ML IV SOSY
PREFILLED_SYRINGE | INTRAVENOUS | Status: DC | PRN
  Administered 2021-12-09: 80 mg via INTRAVENOUS

## 2021-12-09 MED ORDER — PROPOFOL 10 MG/ML IV BOLUS
INTRAVENOUS | Status: DC | PRN
  Administered 2021-12-09 (×2): 10 mg via INTRAVENOUS
  Administered 2021-12-09: 120 mg via INTRAVENOUS
  Administered 2021-12-09 (×2): 10 mg via INTRAVENOUS

## 2021-12-09 MED ORDER — ALBUTEROL SULFATE (2.5 MG/3ML) 0.083% IN NEBU: 2.5000 mg | INHALATION_SOLUTION | Freq: Four times a day (QID) | RESPIRATORY_TRACT | Status: DC | PRN

## 2021-12-09 MED ORDER — DEXAMETHASONE SODIUM PHOSPHATE 10 MG/ML IJ SOLN
INTRAMUSCULAR | Status: AC
  Filled 2021-12-09: qty 1

## 2021-12-09 MED ORDER — FENTANYL CITRATE (PF) 250 MCG/5ML IJ SOLN
INTRAMUSCULAR | Status: AC
  Filled 2021-12-09: qty 5

## 2021-12-09 MED ORDER — MIDAZOLAM HCL 2 MG/2ML IJ SOLN
INTRAMUSCULAR | Status: DC | PRN
  Administered 2021-12-09: 2 mg via INTRAVENOUS

## 2021-12-09 MED ORDER — MIDAZOLAM HCL 2 MG/2ML IJ SOLN
INTRAMUSCULAR | Status: AC
  Filled 2021-12-09: qty 2

## 2021-12-09 MED ORDER — FENTANYL CITRATE (PF) 100 MCG/2ML IJ SOLN
INTRAMUSCULAR | Status: AC
  Filled 2021-12-09: qty 2

## 2021-12-09 MED ORDER — EPHEDRINE SULFATE (PRESSORS) 50 MG/ML IJ SOLN
INTRAMUSCULAR | Status: DC | PRN
  Administered 2021-12-09: 5 mg via INTRAVENOUS

## 2021-12-09 MED ORDER — FENTANYL CITRATE (PF) 100 MCG/2ML IJ SOLN
25.0000 ug | INTRAMUSCULAR | Status: DC | PRN
  Administered 2021-12-09 (×4): 25 ug via INTRAVENOUS

## 2021-12-09 MED ORDER — ROCURONIUM BROMIDE 100 MG/10ML IV SOLN
INTRAVENOUS | Status: DC | PRN
  Administered 2021-12-09: 50 mg via INTRAVENOUS

## 2021-12-09 MED ORDER — ARIPIPRAZOLE 10 MG PO TABS
20.0000 mg | ORAL_TABLET | Freq: Every day | ORAL | Status: DC
  Administered 2021-12-09 – 2021-12-13 (×4): 20 mg via ORAL
  Filled 2021-12-09 (×6): qty 2

## 2021-12-09 MED ORDER — MORPHINE SULFATE (PF) 4 MG/ML IV SOLN
4.0000 mg | Freq: Once | INTRAVENOUS | Status: AC
  Administered 2021-12-09: 4 mg via INTRAVENOUS
  Filled 2021-12-09: qty 1

## 2021-12-09 MED ORDER — SUGAMMADEX SODIUM 500 MG/5ML IV SOLN
INTRAVENOUS | Status: AC
  Filled 2021-12-09: qty 5

## 2021-12-09 MED ORDER — ONDANSETRON HCL 4 MG/2ML IJ SOLN
4.0000 mg | Freq: Four times a day (QID) | INTRAMUSCULAR | Status: DC | PRN
  Administered 2021-12-09: 4 mg via INTRAVENOUS

## 2021-12-09 MED ORDER — IOHEXOL 180 MG/ML  SOLN
INTRAMUSCULAR | Status: DC | PRN
Start: 2021-12-09 — End: 2021-12-09
  Administered 2021-12-09: 10 mL

## 2021-12-09 MED ORDER — INDOCYANINE GREEN 25 MG IV SOLR
1.2500 mg | Freq: Once | INTRAVENOUS | Status: DC
  Filled 2021-12-09: qty 10

## 2021-12-09 MED ORDER — ONDANSETRON HCL 4 MG/2ML IJ SOLN: 4.0000 mg | Freq: Once | INTRAMUSCULAR | Status: DC | PRN

## 2021-12-09 MED ORDER — SPY AGENT GREEN - (INDOCYANINE FOR INJECTION)
INTRAMUSCULAR | Status: DC | PRN
  Administered 2021-12-09: .5 mL

## 2021-12-09 MED ORDER — ONDANSETRON HCL 4 MG/2ML IJ SOLN
4.0000 mg | Freq: Once | INTRAMUSCULAR | Status: AC
Start: 2021-12-09 — End: 2021-12-09
  Administered 2021-12-09: 4 mg via INTRAVENOUS
  Filled 2021-12-09: qty 2

## 2021-12-09 MED ORDER — ACETAMINOPHEN 650 MG RE SUPP: 650.0000 mg | Freq: Four times a day (QID) | RECTAL | Status: DC | PRN

## 2021-12-09 MED ORDER — ACETAMINOPHEN 10 MG/ML IV SOLN
INTRAVENOUS | Status: DC | PRN
Start: 2021-12-09 — End: 2021-12-09
  Administered 2021-12-09: 1000 mg via INTRAVENOUS

## 2021-12-09 MED ORDER — PANTOPRAZOLE SODIUM 40 MG IV SOLR
40.0000 mg | Freq: Every day | INTRAVENOUS | Status: DC
  Administered 2021-12-10 – 2021-12-12 (×3): 40 mg via INTRAVENOUS
  Filled 2021-12-09 (×4): qty 40

## 2021-12-09 MED ORDER — PHENYLEPHRINE HCL-NACL 20-0.9 MG/250ML-% IV SOLN
INTRAVENOUS | Status: AC
  Filled 2021-12-09: qty 250

## 2021-12-09 MED ORDER — HYDROCODONE-ACETAMINOPHEN 5-325 MG PO TABS
1.0000 | ORAL_TABLET | ORAL | Status: DC | PRN
  Administered 2021-12-10: 06:00:00 1 via ORAL
  Administered 2021-12-10 (×2): 2 via ORAL
  Administered 2021-12-10: 1 via ORAL
  Administered 2021-12-10 – 2021-12-13 (×14): 2 via ORAL
  Filled 2021-12-09 (×2): qty 2
  Filled 2021-12-09: qty 1
  Filled 2021-12-09 (×6): qty 2
  Filled 2021-12-09: qty 1
  Filled 2021-12-09 (×3): qty 2
  Filled 2021-12-09: qty 1
  Filled 2021-12-09 (×5): qty 2

## 2021-12-09 MED ORDER — 0.9 % SODIUM CHLORIDE (POUR BTL) OPTIME
TOPICAL | Status: DC | PRN
  Administered 2021-12-09: 10 mL

## 2021-12-09 MED ORDER — OXYCODONE HCL 5 MG PO TABS
ORAL_TABLET | ORAL | Status: AC
  Filled 2021-12-09: qty 1

## 2021-12-09 MED ORDER — PROPOFOL 500 MG/50ML IV EMUL: INTRAVENOUS | Status: DC | PRN

## 2021-12-09 MED ORDER — ENOXAPARIN SODIUM 40 MG/0.4ML IJ SOSY
40.0000 mg | PREFILLED_SYRINGE | INTRAMUSCULAR | Status: DC
  Administered 2021-12-10 – 2021-12-13 (×3): 40 mg via SUBCUTANEOUS
  Filled 2021-12-09 (×3): qty 0.4

## 2021-12-09 MED ORDER — SODIUM CHLORIDE 0.9 % IR SOLN
Status: DC | PRN
  Administered 2021-12-09: 1000 mL via INTRAVESICAL

## 2021-12-09 MED ORDER — ROCURONIUM BROMIDE 10 MG/ML (PF) SYRINGE
PREFILLED_SYRINGE | INTRAVENOUS | Status: AC
  Filled 2021-12-09: qty 10

## 2021-12-09 MED ORDER — OXYCODONE HCL 5 MG PO TABS
5.0000 mg | ORAL_TABLET | Freq: Once | ORAL | Status: AC | PRN
  Administered 2021-12-09: 5 mg via ORAL

## 2021-12-09 MED ORDER — ACETAMINOPHEN 325 MG PO TABS: 650.0000 mg | ORAL_TABLET | Freq: Four times a day (QID) | ORAL | Status: DC | PRN

## 2021-12-09 MED ORDER — UMECLIDINIUM-VILANTEROL 62.5-25 MCG/ACT IN AEPB
1.0000 | INHALATION_SPRAY | Freq: Every day | RESPIRATORY_TRACT | Status: DC
  Administered 2021-12-10 – 2021-12-13 (×4): 1 via RESPIRATORY_TRACT
  Filled 2021-12-09 (×2): qty 14

## 2021-12-09 MED ORDER — BUPIVACAINE-EPINEPHRINE (PF) 0.25% -1:200000 IJ SOLN
INTRAMUSCULAR | Status: AC
  Filled 2021-12-09: qty 30

## 2021-12-09 MED ORDER — INDOCYANINE GREEN 25 MG IV SOLR
INTRAVENOUS | Status: DC | PRN
Start: 2021-12-09 — End: 2021-12-09
  Administered 2021-12-09: 1.25 mg via INTRAVENOUS

## 2021-12-09 MED ORDER — LIDOCAINE HCL (PF) 2 % IJ SOLN
INTRAMUSCULAR | Status: AC
  Filled 2021-12-09: qty 5

## 2021-12-09 MED ORDER — PROPOFOL 500 MG/50ML IV EMUL
INTRAVENOUS | Status: AC
  Filled 2021-12-09: qty 50

## 2021-12-09 MED ORDER — SODIUM CHLORIDE 0.9 % IR SOLN
Status: DC | PRN
  Administered 2021-12-09: 1000 mL

## 2021-12-09 MED ORDER — SODIUM CHLORIDE 0.9 % IV SOLN: INTRAVENOUS | Status: DC

## 2021-12-09 MED ORDER — FENTANYL CITRATE (PF) 100 MCG/2ML IJ SOLN
INTRAMUSCULAR | Status: DC | PRN
  Administered 2021-12-09: 100 ug via INTRAVENOUS
  Administered 2021-12-09 (×3): 50 ug via INTRAVENOUS
  Administered 2021-12-09: 100 ug via INTRAVENOUS

## 2021-12-09 MED ORDER — HYDROMORPHONE HCL 1 MG/ML IJ SOLN: 0.5000 mg | INTRAMUSCULAR | Status: DC | PRN

## 2021-12-09 MED ORDER — ONDANSETRON HCL 4 MG/2ML IJ SOLN
INTRAMUSCULAR | Status: AC
  Filled 2021-12-09: qty 2

## 2021-12-09 MED ORDER — BUPIVACAINE-EPINEPHRINE 0.25% -1:200000 IJ SOLN
INTRAMUSCULAR | Status: DC | PRN
  Administered 2021-12-09: 20 mL
  Administered 2021-12-09: 10 mL

## 2021-12-09 MED ORDER — PIPERACILLIN-TAZOBACTAM 3.375 G IVPB
3.3750 g | Freq: Three times a day (TID) | INTRAVENOUS | Status: DC
  Administered 2021-12-09 – 2021-12-13 (×11): 3.375 g via INTRAVENOUS
  Filled 2021-12-09 (×16): qty 50

## 2021-12-09 MED ORDER — ONDANSETRON 4 MG PO TBDP: 4.0000 mg | ORAL_TABLET | Freq: Four times a day (QID) | ORAL | Status: DC | PRN

## 2021-12-09 MED ORDER — ACETAMINOPHEN 10 MG/ML IV SOLN: 1000.0000 mg | Freq: Once | INTRAVENOUS | Status: DC | PRN

## 2021-12-09 MED ORDER — PHENYLEPHRINE 40 MCG/ML (10ML) SYRINGE FOR IV PUSH (FOR BLOOD PRESSURE SUPPORT)
PREFILLED_SYRINGE | INTRAVENOUS | Status: DC | PRN
Start: 2021-12-09 — End: 2021-12-09
  Administered 2021-12-09 (×2): 80 ug via INTRAVENOUS

## 2021-12-09 MED ORDER — KETAMINE HCL 10 MG/ML IJ SOLN
INTRAMUSCULAR | Status: DC | PRN
  Administered 2021-12-09: 10 mg via INTRAVENOUS
  Administered 2021-12-09: 30 mg via INTRAVENOUS
  Administered 2021-12-09: 10 mg via INTRAVENOUS

## 2021-12-09 MED ORDER — ACETAMINOPHEN 10 MG/ML IV SOLN
INTRAVENOUS | Status: AC
  Filled 2021-12-09: qty 100

## 2021-12-09 SURGICAL SUPPLY — 69 items
BAG INFUSER PRESSURE 100CC (MISCELLANEOUS) ×1 IMPLANT
BLADE SURG SZ11 CARB STEEL (BLADE) ×3 IMPLANT
BRUSH SCRUB EZ 1% IODOPHOR (MISCELLANEOUS) ×3 IMPLANT
BULB RESERV EVAC DRAIN JP 100C (MISCELLANEOUS) ×1 IMPLANT
CANNULA REDUC XI 12-8 STAPL (CANNULA) ×1
CANNULA REDUCER 12-8 DVNC XI (CANNULA) ×2 IMPLANT
CATH URETL OPEN 5X70 (CATHETERS) ×1 IMPLANT
CLIP LIGATING HEMO O LOK GREEN (MISCELLANEOUS) ×3 IMPLANT
DECANTER SPIKE VIAL GLASS SM (MISCELLANEOUS) ×4 IMPLANT
DERMABOND ADVANCED (GAUZE/BANDAGES/DRESSINGS) ×1
DERMABOND ADVANCED .7 DNX12 (GAUZE/BANDAGES/DRESSINGS) ×2 IMPLANT
DRAIN CHANNEL JP 19F (MISCELLANEOUS) ×1 IMPLANT
DRAPE ARM DVNC X/XI (DISPOSABLE) ×8 IMPLANT
DRAPE COLUMN DVNC XI (DISPOSABLE) ×2 IMPLANT
DRAPE DA VINCI XI ARM (DISPOSABLE) ×4
DRAPE DA VINCI XI COLUMN (DISPOSABLE) ×1
ELECT REM PT RETURN 9FT ADLT (ELECTROSURGICAL) ×3
ELECTRODE REM PT RTRN 9FT ADLT (ELECTROSURGICAL) ×2 IMPLANT
GAUZE 4X4 16PLY ~~LOC~~+RFID DBL (SPONGE) ×6 IMPLANT
GLOVE SURG ENC MOIS LTX SZ6.5 (GLOVE) ×6 IMPLANT
GLOVE SURG ENC MOIS LTX SZ7 (GLOVE) ×4 IMPLANT
GLOVE SURG UNDER POLY LF SZ6.5 (GLOVE) ×7 IMPLANT
GLOVE SURG UNDER POLY LF SZ7.5 (GLOVE) ×5 IMPLANT
GOWN STRL REUS W/ TWL LRG LVL3 (GOWN DISPOSABLE) ×8 IMPLANT
GOWN STRL REUS W/ TWL XL LVL3 (GOWN DISPOSABLE) ×2 IMPLANT
GOWN STRL REUS W/TWL LRG LVL3 (GOWN DISPOSABLE) ×3
GOWN STRL REUS W/TWL XL LVL3 (GOWN DISPOSABLE) ×3
GRASPER SUT TROCAR 14GX15 (MISCELLANEOUS) ×3 IMPLANT
GUIDEWIRE STR DUAL SENSOR (WIRE) ×3 IMPLANT
IRRIGATOR SUCT 8 DISP DVNC XI (IRRIGATION / IRRIGATOR) IMPLANT
IRRIGATOR SUCTION 8MM XI DISP (IRRIGATION / IRRIGATOR) ×1
IV NS 1000ML (IV SOLUTION) ×1
IV NS 1000ML BAXH (IV SOLUTION) IMPLANT
IV NS IRRIG 3000ML ARTHROMATIC (IV SOLUTION) ×3 IMPLANT
KIT PINK PAD W/HEAD ARE REST (MISCELLANEOUS) ×3 IMPLANT
KIT PINK PAD W/HEAD ARM REST (MISCELLANEOUS) ×2 IMPLANT
KIT TURNOVER CYSTO (KITS) ×3 IMPLANT
LABEL OR SOLS (LABEL) ×3 IMPLANT
MANIFOLD NEPTUNE II (INSTRUMENTS) ×3 IMPLANT
NDL INSUFFLATION 14GA 120MM (NEEDLE) ×2 IMPLANT
NEEDLE HYPO 22GX1.5 SAFETY (NEEDLE) ×3 IMPLANT
NEEDLE INSUFFLATION 14GA 120MM (NEEDLE) ×3 IMPLANT
NS IRRIG 500ML POUR BTL (IV SOLUTION) ×3 IMPLANT
OBTURATOR OPTICAL STANDARD 8MM (TROCAR) ×1
OBTURATOR OPTICAL STND 8 DVNC (TROCAR) ×2
OBTURATOR OPTICALSTD 8 DVNC (TROCAR) ×2 IMPLANT
PACK CYSTO AR (MISCELLANEOUS) ×3 IMPLANT
PACK LAP CHOLECYSTECTOMY (MISCELLANEOUS) ×3 IMPLANT
POUCH ENDO CATCH 10MM SPEC (MISCELLANEOUS) ×2 IMPLANT
POUCH SPECIMEN RETRIEVAL 10MM (ENDOMECHANICALS) ×2 IMPLANT
SEAL CANN UNIV 5-8 DVNC XI (MISCELLANEOUS) ×6 IMPLANT
SEAL XI 5MM-8MM UNIVERSAL (MISCELLANEOUS) ×4
SET CYSTO W/LG BORE CLAMP LF (SET/KITS/TRAYS/PACK) ×3 IMPLANT
SET TUBE SMOKE EVAC HIGH FLOW (TUBING) ×3 IMPLANT
SOLUTION ELECTROLUBE (MISCELLANEOUS) ×3 IMPLANT
SPONGE T-LAP 4X18 ~~LOC~~+RFID (SPONGE) ×1 IMPLANT
STAPLER CANNULA SEAL DVNC XI (STAPLE) ×2 IMPLANT
STAPLER CANNULA SEAL XI (STAPLE) ×1
STENT URET 6FRX24 CONTOUR (STENTS) ×1 IMPLANT
STENT URET 6FRX26 CONTOUR (STENTS) IMPLANT
SURGILUBE 2OZ TUBE FLIPTOP (MISCELLANEOUS) ×3 IMPLANT
SUT ETHILON 3-0 FS-10 30 BLK (SUTURE) ×3
SUT MNCRL 4-0 (SUTURE) ×2
SUT MNCRL 4-0 27XMFL (SUTURE) ×4
SUT VICRYL 0 AB UR-6 (SUTURE) ×4 IMPLANT
SUTURE EHLN 3-0 FS-10 30 BLK (SUTURE) IMPLANT
SUTURE MNCRL 4-0 27XMF (SUTURE) ×2 IMPLANT
SYR 10ML LL (SYRINGE) ×3 IMPLANT
WATER STERILE IRR 500ML POUR (IV SOLUTION) ×6 IMPLANT

## 2021-12-09 NOTE — ED Notes (Signed)
Pt is resting  iv infusing well  denies any comps at present

## 2021-12-09 NOTE — Op Note (Signed)
Preoperative diagnosis:  Right mid ureteral calculus with obstruction  Postoperative diagnosis:  Same  Procedure: Cystoscopy with right ureteral stent placement Right retrograde pyelogram  Surgeon: Riki Altes, MD  Anesthesia: General  Complications: None  Intraoperative findings:  1.  Cystoscopy-bladder mucosa normal in appearance without erythema, solid or papillary lesions.  UOs normal-appearing bilaterally. 2.  Right retrograde pyelogram-moderate hydronephrosis/hydroureter to the mid ureter with a faint filling defect in this region secondary to the calculus noted on CT  EBL: Minimal  Specimens: None  Indication: Kim Graham is a 71 y.o. patient admitted with acute cholecystitis and incidentally found to have a 7 mm right mid ureteral calculus with hydronephrosis.  Scheduled for robotic assisted laparoscopic cholecystectomy by Dr. Maia Plan and placement of right ureteral stent recommended with definitive stone treatment at a later date.  After reviewing the management options for treatment, she elected to proceed with the above surgical procedure(s). We have discussed the potential benefits and risks of the procedure, side effects of the proposed treatment, the likelihood of the patient achieving the goals of the procedure, and any potential problems that might occur during the procedure or recuperation. Informed consent has been obtained.  Description of procedure:  The patient was taken to the operating room and general anesthesia was induced.  The patient was placed in the dorsal lithotomy position, prepped and draped in the usual sterile fashion, and preoperative antibiotics were administered. A preoperative time-out was performed.   A 21 French cystoscope sheath was lubricated and passed per urethra.  Panendoscopy was performed with findings as described above.  A 5 French open-ended ureteral catheter was placed through the cystoscope and into the right UO.  Right  retrograde pyelogram was performed with findings as described above.  A 0.038 Sensor wire was placed through the ureteral catheter and the catheter was removed.  A 68F/24 cm Contour ureteral stent was then advanced over the guidewire under C arm guidance. Good curl was noted in the renal pelvis under fluoroscopy and distal end of stent was well positioned in the bladder.  The bladder was emptied and cystoscope was removed.  She was then repositioned, prepped and draped for her robotic assisted laparoscopic cholecystectomy which will be dictated separately by Dr. Hazle Quant.  Recommendation: If no complaints in a.m. okay for discharge from urologic standpoint 1-2 week follow-up to discuss definitive stone treatment options Oxybutynin IR 5 mg 3 times daily as needed stent irritation   Riki Altes, M.D.

## 2021-12-09 NOTE — ED Notes (Signed)
See triage note  presents with some abd and back pain states she was seen yesterday by PCP and had CT scan done  was told to come to ED today  Dr Cyril Loosen in with pt on arrival

## 2021-12-09 NOTE — ED Provider Notes (Signed)
[facility] Provider Note    Event Date/Time   First MD Initiated Contact with Patient 12/09/21 [phone]     (approximate)   History   Abdominal Pain and Flank Pain   HPI  Kim Graham is a 71 y.o. female who presents with right upper quadrant abdominal pain.  Patient reports this started 3 days ago on Saturday afternoon.  She saw her PCP yesterday who ordered labs and CT scan.  She was called this morning and told to come to the emergency department because of kidney stone.  I have reviewed the CT scan from yesterday, it actually demonstrates acute cholecystitis as well as a 5 mm right mid ureteral stone.  The patient's pain is primarily in the right upper quadrant which suggests that her cholecystitis is the primary cause of her pain today.     Physical Exam   Triage Vital Signs: ED Triage Vitals  Enc Vitals Group     BP 12/09/21 0955 136/70     Pulse Rate 12/09/21 0955 (!) 112     Resp 12/09/21 0955 16     Temp 12/09/21 0955 98.6 F (37 C)     Temp Source 12/09/21 0955 Oral     SpO2 12/09/21 0955 96 %     Weight 12/09/21 0957 61.2 kg (135 lb)     Height 12/09/21 0957 1.626 m (5\' 4" )     Head Circumference --      Peak Flow --      Pain Score 12/09/21 0956 5     Pain Loc --      Pain Edu? --      Excl. in GC? --     Most recent vital signs: Vitals:   12/09/21 0955  BP: 136/70  Pulse: (!) 112  Resp: 16  Temp: 98.6 F (37 C)  SpO2: 96%     General: Awake, no distress.  CV:  Good peripheral perfusion.  Resp:  Normal effort.  Abd:  No distention.  Tenderness palpation right upper quadrant, no CVA tenderness Other:     ED Results / Procedures / Treatments   Labs (all labs ordered are listed, but only abnormal results are displayed) Labs Reviewed  CBC - Abnormal; Notable for the following components:      Result Value   WBC 13.0 (*)    Hemoglobin 15.2 (*)    HCT 46.1 (*)    All other components within normal limits   COMPREHENSIVE METABOLIC PANEL - Abnormal; Notable for the following components:   Glucose, Bld 109 (*)    BUN 28 (*)    All other components within normal limits  URINALYSIS, COMPLETE (UACMP) WITH MICROSCOPIC - Abnormal; Notable for the following components:   Hgb urine dipstick MODERATE (*)    Bilirubin Urine SMALL (*)    Ketones, ur TRACE (*)    Protein, ur TRACE (*)    Leukocytes,Ua SMALL (*)    All other components within normal limits  RESP PANEL BY RT-PCR (FLU A&B, COVID) ARPGX2  LIPASE, BLOOD  HIV ANTIBODY (ROUTINE TESTING W REFLEX)     EKG     RADIOLOGY CT scan from yesterday reviewed, consistent with acute cholecystitis and right 5 mm stone    PROCEDURES:  Critical Care performed:   Procedures   MEDICATIONS ORDERED IN ED: Medications  ARIPiprazole (ABILIFY) tablet 20 mg (has no administration in time range)  albuterol (PROVENTIL) (2.5 MG/3ML) 0.083% nebulizer solution 2.5 mg (has no administration in  time range)  umeclidinium-vilanterol (ANORO ELLIPTA) 62.5-25 MCG/ACT 1 puff (has no administration in time range)  piperacillin-tazobactam (ZOSYN) IVPB 3.375 g (has no administration in time range)  enoxaparin (LOVENOX) injection 40 mg (has no administration in time range)  0.9 %  sodium chloride infusion ( Intravenous New Bag/Given 12/09/21 1147)  acetaminophen (TYLENOL) tablet 650 mg (has no administration in time range)    Or  acetaminophen (TYLENOL) suppository 650 mg (has no administration in time range)  HYDROcodone-acetaminophen (NORCO/VICODIN) 5-325 MG per tablet 1-2 tablet (has no administration in time range)  morphine 4 MG/ML injection 4 mg (has no administration in time range)  ondansetron (ZOFRAN-ODT) disintegrating tablet 4 mg (has no administration in time range)    Or  ondansetron (ZOFRAN) injection 4 mg (has no administration in time range)  pantoprazole (PROTONIX) injection 40 mg (has no administration in time range)  indocyanine green  (IC-GREEN) injection 1.25 mg (has no administration in time range)  morphine 4 MG/ML injection 4 mg (4 mg Intravenous Given 12/09/21 1035)  ondansetron (ZOFRAN) injection 4 mg (4 mg Intravenous Given 12/09/21 1035)     IMPRESSION / MDM / ASSESSMENT AND PLAN / ED COURSE  I reviewed the triage vital signs and the nursing notes.   Patient presents with right upper quadrant abdominal pain as noted above.  CT scan from yesterday demonstrates acute cholecystitis as well as right 5 mm stone  I suspect her pain is primarily related to acute cholecystitis as her pain is entirely in the right upper quadrant  Her lab work from yesterday is overall reassuring, LFTs are normal, we will repeat CBC CMP and lipase today  I have consulted Dr. Maia Plan of general surgery and he is seeing the patient currently in the emergency department  She has been n.p.o. since 6 AM this morning.  Pending urinalysis  We will treat with IV morphine, IV Zofran.   Lab work demonstrates mildly elevated white blood cell count of 13, BMP is otherwise reassuring, no evidence of UTI        FINAL CLINICAL IMPRESSION(S) / ED DIAGNOSES   Final diagnoses:  Cholecystitis, acute  Kidney stone     Rx / DC Orders   ED Discharge Orders     None        Note:  This document was prepared using Dragon voice recognition software and may include unintentional dictation errors.   Jene Every, MD 12/09/21 1201

## 2021-12-09 NOTE — Anesthesia Preprocedure Evaluation (Addendum)
Anesthesia Evaluation  Patient identified by MRN, date of birth, ID band Patient awake    Reviewed: Allergy & Precautions, NPO status , Patient's Chart, lab work & pertinent test results  History of Anesthesia Complications Negative for: history of anesthetic complications  Airway Mallampati: II  TM Distance: >3 FB Neck ROM: Full    Dental  (+) Edentulous Upper, Poor Dentition, Missing, Chipped, Partial Lower   Pulmonary neg sleep apnea, COPD,  COPD inhaler, Patient abstained from smoking.Not current smoker, former smoker,  Averages inhaler use once per day. Not on home O2. Breathing feels at baseline today   Pulmonary exam normal breath sounds clear to auscultation       Cardiovascular Exercise Tolerance: Good METS(-) hypertension(-) CAD and (-) Past MI negative cardio ROS  (-) dysrhythmias  Rhythm:Regular Rate:Normal - Systolic murmurs TTE [id number]: 1. Left ventricular ejection fraction, by estimation, is 60 to 65%. The  left ventricle has normal function. The left ventricle has no regional  wall motion abnormalities. Left ventricular diastolic parameters are  consistent with Grade I diastolic  dysfunction (impaired relaxation).  2. Right ventricular systolic function is normal. The right ventricular  size is normal. There is mildly elevated pulmonary artery systolic  pressure. The estimated right ventricular systolic pressure is [id number] mmHg.  3. The mitral valve is normal in structure. No evidence of mitral valve  regurgitation. No evidence of mitral stenosis.  4. The aortic valve is normal in structure. Aortic valve regurgitation is  not visualized. Mild to moderate aortic valve sclerosis/calcification is  present, without any evidence of aortic stenosis.  5. The inferior vena cava is normal in size with <50% respiratory  variability, suggesting right atrial pressure of 8 mmHg.    Neuro/Psych PSYCHIATRIC DISORDERS  Depression negative neurological ROS     GI/Hepatic neg GERD  ,(+)     substance abuse  , Hepatitis -, CFormer substance abuser, now on chronic methadone   Endo/Other  diabetes  Renal/GU Renal diseaseNew obstructing kidney stone without renal failure     Musculoskeletal   Abdominal   Peds  Hematology   Anesthesia Other Findings Past Medical History: No date: COPD (chronic obstructive pulmonary disease) ([place]) 04/15/2018: Diabetes mellitus type 2, diet-controlled (H[facility])  Reproductive/Obstetrics                            Anesthesia Physical Anesthesia Plan  ASA: 3  Anesthesia Plan: General   Post-op Pain Management: Ketamine IV, Ofirmev IV (intra-op) and Toradol IV (intra-op)   Induction: Intravenous  PONV Risk Score and Plan: 4 or greater and Ondansetron, Dexamethasone, Midazolam and Treatment may vary due to age or medical condition  Airway Management Planned: Oral ETT and Video Laryngoscope Planned  Additional Equipment: None  Intra-op Plan:   Post-operative Plan: Extubation in OR  Informed Consent: I have reviewed the patients History and Physical, chart, labs and discussed the procedure including the risks, benefits and alternatives for the proposed anesthesia with the patient or authorized representative who has indicated his/her understanding and acceptance.     Dental advisory given  Plan Discussed with: CRNA and Surgeon  Anesthesia Plan Comments: (Denies N/V. Discussed risks of anesthesia with patient, including PONV, sore throat, lip/dental/eye damage. Rare risks discussed as well, such as cardiorespiratory and neurological sequelae, and allergic reactions. Discussed the role of CRNA in patient's perioperative care. Patient understands.)        Anesthesia Quick Evaluation

## 2021-12-09 NOTE — ED Notes (Signed)
Resting at present   awaiting OR

## 2021-12-09 NOTE — ED Triage Notes (Addendum)
Pt states that she had a ct of her abd yesterday and was told to come to the ER last pm, pt states that she is having abd pain that radiates around to her back Ct results from yesterday state 70mm obstructing calculi and acute cholecystitis

## 2021-12-09 NOTE — Progress Notes (Signed)
Patient has acute cholecystitis on CT. She is in the ER for evaluation.

## 2021-12-09 NOTE — H&P (Signed)
SURGICAL CONSULTATION NOTE   HISTORY OF PRESENT ILLNESS (HPI):  72 y.o. female presented to [facility] ED for evaluation of abdominal pain since 4 days ago. Patient reports starting abdominal pain on Saturday.  The pain has been getting more intensified.  Pain on the right side of the abdomen.  Pain in the tolerated to the Parth body.  She denies any alleviating or aggravating factors.  She denies any fever or chills.  She came to the ED and she was found with leukocytosis.  CT scan of the abdomen and pelvis shows that [place] thickening with pericholecystic edema.  There was dilated common bile duct.  Liver enzymes, bilirubin and alkaline phosphatase are within normal limits.  I personally have read the images of the CT scan.  There was also incidental finding of left urolithiasis.  Surgery is consulted by Dr. Corky Downs in this context for evaluation and management of acute cholecystitis.  PAST MEDICAL HISTORY (PMH):  Past Medical History:  Diagnosis Date   COPD (chronic obstructive pulmonary disease) ([place])    Diabetes mellitus type 2, diet-controlled ([place]) 04/15/2018     PAST SURGICAL HISTORY ([place]):  No past surgical history on file.   MEDICATIONS:  Prior to Admission medications   Medication Sig Start Date End Date Taking? Authorizing Provider  albuterol (VENTOLIN HFA) 108 (90 Base) MCG/ACT inhaler Inhale 2 puffs into the lungs every 6 (six) hours as needed for wheezing or shortness of breath. 10/14/21   Johnson, Megan P, DO  ARIPiprazole (ABILIFY) 20 MG tablet Take 1 tablet (20 mg total) by mouth daily. 10/14/21   Park Liter P, DO  conjugated estrogens (PREMARIN) vaginal cream Place [id number] Applicatorfuls vaginally daily. 04/04/21   Johnson, Megan P, DO  ibuprofen (ADVIL) 600 MG tablet Take 1 tablet (600 mg total) by mouth every 8 (eight) hours as needed. 04/26/20   Sable Feil, PA-C  METHADONE HCL PO Take 83 mg by mouth daily.    [provider]  umeclidinium-vilanterol (ANORO  ELLIPTA) 62.5-25 MCG/ACT AEPB Inhale 1 puff into the lungs daily at 6 (six) AM. 10/14/21   Valerie Roys, DO     ALLERGIES:  No Known Allergies   SOCIAL HISTORY:  Social History   Socioeconomic History   Marital status: Single    Spouse name: Not on file   Number of children: Not on file   Years of education: Not on file   Highest education level: Not on file  Occupational History   Not on file  Tobacco Use   Smoking status: Every Day    Packs/day: 0.50    Years: 49.00    Pack years: 24.50    Types: Cigarettes    Last attempt to quit: 12/13/2018    Years since quitting: 2.9   Smokeless tobacco: Never  Vaping Use   Vaping Use: Never used  Substance and Sexual Activity   Alcohol use: Not Currently    Comment: On occasion   Drug use: No   Sexual activity: Not Currently  Other Topics Concern   Not on file  Social History Narrative   Not on file   Social Determinants of Health   Financial Resource Strain: Medium Risk   Difficulty of Paying Living Expenses: Somewhat hard  Food Insecurity: Food Insecurity Present   Worried About Running Out of Food in the Last Year: Sometimes true   Ran Out of Food in the Last Year: Sometimes true  Transportation Needs: No Transportation Needs   Lack of Transportation (Medical):  No   Lack of Transportation (Non-Medical): No  Physical Activity: Insufficiently Active   Days of Exercise per Week: 4 days   Minutes of Exercise per Session: 20 min  Stress: No Stress Concern Present   Feeling of Stress : Not at all  Social Connections: Moderately Isolated   Frequency of Communication with Friends and Family: More than three times a week   Frequency of Social Gatherings with Friends and Family: Three times a week   Attends Religious Services: Never   Active Member of Clubs or Organizations: No   Attends Music therapist: More than 4 times per year   Marital Status: Divorced  Human resources officer Violence: Not At Risk   Fear of  Current or Ex-Partner: No   Emotionally Abused: No   Physically Abused: No   Sexually Abused: No     FAMILY HISTORY:  Family History  Problem Relation Age of Onset   Diabetes Mother    Cancer Father        Pancreatic   Diabetes Maternal Aunt    Diabetes Maternal Grandmother    Dementia Maternal Grandfather    Heart disease Maternal Grandfather      REVIEW OF SYSTEMS:  Constitutional: denies weight loss, fever, chills, or sweats  Eyes: denies any other vision changes, history of eye injury  ENT: denies sore throat, hearing problems  Respiratory: denies shortness of breath, wheezing  Cardiovascular: denies chest pain, palpitations  Gastrointestinal: positive abdominal pain, Denies nausea and vomitnig Genitourinary: denies burning with urination or urinary frequency Musculoskeletal: denies any other joint pains or cramps  Skin: denies any other rashes or skin discolorations  Neurological: denies any other headache, dizziness, weakness  Psychiatric: denies any other depression, anxiety   All other review of systems were negative   VITAL SIGNS:  Temp:  [97.9 F (36.6 C)-98.6 F (37 C)] 98.6 F (37 C) (01/31 0955) Pulse Rate:  [109-112] 112 (01/31 0955) Resp:  [16] 16 (01/31 0955) BP: (100-136)/(65-70) 136/70 (01/31 0955) SpO2:  [93 %-96 %] 96 % (01/31 0955) Weight:  [56 kg-61.2 kg] 61.2 kg (01/31 0957)     Height: 5\' 4"  (162.6 cm) Weight: 61.2 kg BMI (Calculated): 23.16   INTAKE/OUTPUT:  This shift: No intake/output data recorded.  Last 2 shifts: @IOLAST2SHIFTS @   PHYSICAL EXAM:  Constitutional:  -- Normal body habitus  -- Awake, alert, and oriented x3  Eyes:  -- Pupils equally round and reactive to light  -- No scleral icterus  Ear, nose, and throat:  -- No jugular venous distension  Pulmonary:  -- No crackles  -- Equal breath sounds bilaterally -- Breathing non-labored at rest Cardiovascular:  -- S1, S2 present  -- No pericardial rubs Gastrointestinal:   -- Abdomen soft, tender to palpation in the right upper quadrant, non-distended, no guarding or rebound tenderness -- No abdominal masses appreciated, pulsatile or otherwise  Musculoskeletal and Integumentary:  -- Wounds: None appreciated -- Extremities: B/L UE and LE FROM, hands and feet warm, no edema  Neurologic:  -- Motor function: intact and symmetric -- Sensation: intact and symmetric   Labs:  CBC Latest Ref Rng & Units 12/09/2021 12/08/2021 10/14/2021  WBC 4.0 - 10.5 K/uL 13.0(H) 16.2(H) 7.1  Hemoglobin 12.0 - 15.0 g/dL 15.2(H) 15.8 15.3  Hematocrit 36.0 - 46.0 % 46.1(H) 46.8(H) 45.3  Platelets 150 - 400 K/uL 203 224 147(L)   CMP Latest Ref Rng & Units 12/09/2021 12/08/2021 12/08/2021  Glucose 70 - 99 mg/dL 109(H) - 101(H)  BUN  8 - 23 mg/dL 28(H) - 22  Creatinine 0.44 - 1.00 mg/dL 0.86 1.50(H) 1.37(H)  Sodium 135 - 145 mmol/L 136 - 142  Potassium 3.5 - 5.1 mmol/L 4.3 - 4.3  Chloride 98 - 111 mmol/L 101 - 93(L)  CO2 22 - 32 mmol/L 24 - 24  Calcium 8.9 - 10.3 mg/dL 9.1 - 9.2  Total Protein 6.5 - 8.1 g/dL 7.0 - 6.6  Total Bilirubin 0.3 - 1.2 mg/dL 0.8 - 0.5  Alkaline Phos 38 - 126 U/L 73 - 86  AST 15 - 41 U/L 24 - 21  ALT 0 - 44 U/L 16 - 14   Imaging studies:  EXAM: CT ABDOMEN AND PELVIS WITH CONTRAST   TECHNIQUE: Multidetector CT imaging of the abdomen and pelvis was performed using the standard protocol following bolus administration of intravenous contrast.   RADIATION DOSE REDUCTION: This exam was performed according to the departmental dose-optimization program which includes automated exposure control, adjustment of the mA and/or kV according to patient size and/or use of iterative reconstruction technique.   CONTRAST:  15mL OMNIPAQUE IOHEXOL 300 MG/ML  SOLN   COMPARISON:  None.   FINDINGS: Lower chest: No acute abnormality.   Hepatobiliary: No focal liver abnormality. Dilatation of the proximal common bile duct and central hepatic ducts. Tapering distally  to the ampulla. Gallbladder is distended with wall thickening and adjacent edema.   Pancreas: Unremarkable.   Spleen: Unremarkable.   Adrenals/Urinary Tract: Adrenals are unremarkable. Bilateral renal calculi. There are several stones within the left collecting system with the largest measuring 8 mm no hydronephrosis. There is dilatation of the right ureter to the level of the pelvis where there is a 5 mm calculus. Bladder is unremarkable.   Stomach/Bowel: Stomach is within normal limits. Bowel is normal in caliber. Normal appendix.   Vascular/Lymphatic: Atherosclerosis.  No enlarged nodes.   Reproductive: Uterus and bilateral adnexa are unremarkable.   Other: No acute abnormality of the abdominal wall.   Musculoskeletal: Lumbar spine degenerative changes.   IMPRESSION: Acute cholecystitis. There is a biliary dilatation including the common bile duct, which tapers distally. No obstructing stone.   5 mm obstructing calculus of the mid to distal right ureter with proximal hydroureter but no hydronephrosis. Bilateral renal calculi including several within the left collecting system.   These results were called by telephone at the time of interpretation on 12/08/2021 at 6:42 pm to provider Dr. Wynetta Emery, who verbally acknowledged these results.     Electronically Signed   By: Macy Mis M.D.   On: 12/08/2021 18:56  Assessment/Plan:  71 y.o. female with acute cholecystitis, complicated by pertinent comorbidities including COPD and diabetes mellitus type 2.  Patient with history, physical exam and images consistent with acute cholecystitis. Patient oriented about diagnosis and surgical management as treatment.   Discussed the risk of surgery including post-op infxn, seroma, biloma, chronic pain, poor-delayed wound healing, retained gallstone, conversion to open procedure, post-op SBO or ileus, and need for additional procedures to address said risks.  The risks of general  anesthetic including MI, CVA, sudden death or even reaction to anesthetic medications also discussed. Alternatives include continued observation.  Benefits include possible symptom relief, prevention of complications including acute cholecystitis, pancreatitis.  Urology was consulted by ED physician for evaluation of urolithiasis.  We will appreciate their recommendations.  Arnold Long, MD

## 2021-12-09 NOTE — Op Note (Addendum)
Preoperative diagnosis: Acute cholecystitis  Postoperative diagnosis: Same  Procedure: Robotic Assisted Laparoscopic Partial Cholecystectomy.   Anesthesia: GETA   Surgeon: Dr. Hazle Quant  Wound Classification: Clean Contaminated  Indications: Patient is a 71 y.o. female developed right upper quadrant pain, nausea, leukocytosis and on workup was found to have cholelithiasis with cholecystitis. Robotic Assisted Laparoscopic cholecystectomy was elected.  Findings: Unable to get critical view of safety Cirrhotic liver  Partial cholecystectomy needed to be done to avoid injury to biliary ducts Adequate hemostasis   Description of procedure: The patient was placed on the operating table in the supine position. General anesthesia was induced. A time-out was completed verifying correct patient, procedure, site, positioning, and implant(s) and/or special equipment prior to beginning this procedure. An orogastric tube was placed. The abdomen was prepped and draped in the usual sterile fashion.  An incision was made in a natural skin line below the umbilicus.  The fascia was elevated and the Veress needle inserted. Proper position was confirmed by aspiration and saline meniscus test.  The abdomen was insufflated with carbon dioxide to a pressure of 15 mmHg. The patient tolerated insufflation well. A 8-mm trocar was then inserted in optiview fashion.  The laparoscope was inserted and the abdomen inspected. No injuries from initial trocar placement were noted. Additional trocars were then inserted in the following locations: an 8-mm trocar in the left lateral abdomen, and another two 8-mm trocars to the right side of the abdomen 5 cm appart. The umbilical trocar was changed to a 12 mm trocar all under direct visualization. The abdomen was inspected and no abnormalities were found. The table was placed in the reverse Trendelenburg position with the right side up. The robotic arms were docked and target  anatomy identified. Instrument inserted under direct visualization.  Severe adhesions between the gallbladder and omentum, duodenum and transverse colon were lysed with electrocautery and traction. The dome of the gallbladder was opened and gallbladder drained to be able to be grasped with a prograsp and retracted over the dome of the liver.  Even with this retraction, it was impossible to dissect the Calot's triangle. To avoid injury to biliary ducts, I decided to transect the gallbladder and removed the stone. This was a very difficult and time consuming maneuver due to how friable and due to bleeding from the gallbladder lumen. Once all the stones were removed more than half of the gallbladder was transected.  Hemostasis was checked and the pieces of gallbladder and stones were removed using an endoscopic retrieval bag. The gallbladder was passed off the table as a specimen. The gallbladder and right upper quadrant were copiously irrigated with saline and hemostasis was obtained. There was no evidence of bleeding from the gallbladder fossa or cystic artery, but there was leakage of the bile from the cystic duct into infundibular stump. A 19 French drain was left in place in the gallbladder fossa and right upper quadrant.  Secondary trocars were removed under direct vision. No bleeding was noted. The robotic arms were undoked. The scope was withdrawn and the umbilical trocar removed. The abdomen was allowed to collapse. The fascia of the 22mm trocar sites was closed with figure-of-eight 0 vicryl sutures. The skin was closed with subcuticular sutures of 4-0 monocryl and topical skin adhesive. The orogastric tube was removed.  The patient tolerated the procedure well and was taken to the postanesthesia care unit in stable condition.   Specimen: Gallbladder  Complications: None  EBL: 100 mL

## 2021-12-09 NOTE — Transfer of Care (Signed)
Immediate Anesthesia Transfer of Care Note  Patient: Kim Graham  Procedure(s) Performed: XI ROBOTIC ASSISTED LAPAROSCOPIC CHOLECYSTECTOMY INDOCYANINE GREEN FLUORESCENCE IMAGING (ICG) CYSTOSCOPY WITH RETROGRADE PYELOGRAM/URETERAL STENT PLACEMENT (Right)  Patient Location: PACU  Anesthesia Type:General  Level of Consciousness: sedated and patient cooperative  Airway & Oxygen Therapy: Patient Spontanous Breathing and Patient connected to face mask oxygen  Post-op Assessment: Report given to RN and Post -op Vital signs reviewed and stable  Post vital signs: Reviewed and stable  Last Vitals:  Vitals Value Taken Time  BP    Temp    Pulse 104 12/09/21 2315  Resp 24 12/09/21 2315  SpO2 89 % 12/09/21 2315  Vitals shown include unvalidated device data.  Last Pain:  Vitals:   12/09/21 1900  TempSrc: Oral  PainSc:          Complications: No notable events documented.

## 2021-12-09 NOTE — Progress Notes (Signed)
Patient has been called and notified of results and told that she should be evaluated in the ER.

## 2021-12-09 NOTE — Consult Note (Signed)
Urology Consult  Requesting physician: Lavonia Drafts, MD  Reason for consultation: Right ureteral calculus  Chief Complaint: Abdominal pain  History of Present Illness: Kim Graham is a 71 y.o. female who presented to the ED this morning with a 3-day history of right upper quadrant abdominal pain.  Seen by her PCP yesterday who ordered a CT and was informed to proceed to the ED due to findings of acute cholecystitis.  Incidentally noted to have a right mid ureteral calculus though no flank or lower quadrant abdominal pain.  Denies fever, chills, nausea or vomiting.  Scheduled for cholecystectomy this evening by Dr. Windell Moment.  Denies prior history of urinary tract calculi.  CT also showed nonobstructing left renal calculi.  No bothersome LUTS or gross hematuria.  Past Medical History:  Diagnosis Date   COPD (chronic obstructive pulmonary disease) ([place])    Diabetes mellitus type 2, diet-controlled ([place]) 04/15/2018    No past surgical history on file.  Home Medications:  No outpatient medications have been marked as taking for the 12/09/21 encounter [facility] Encounter).    Allergies: No Known Allergies  Family History  Problem Relation Age of Onset   Diabetes Mother    Cancer Father        Pancreatic   Diabetes Maternal Aunt    Diabetes Maternal Grandmother    Dementia Maternal Grandfather    Heart disease Maternal Grandfather     Social History:  reports that she has been smoking cigarettes. She has a 24.50 pack-year smoking history. She has never used smokeless tobacco. She reports that she does not currently use alcohol. She reports that she does not use drugs.  ROS: A complete review of systems was performed.  All systems are negative except for pertinent findings as noted.  Physical Exam:  Vital signs in last 24 hours: Temp:  [98.6 F (37 C)] 98.6 F (37 C) (01/31 0955) Pulse Rate:  [88-112] 88 (01/31 1351) Resp:  [16] 16 (01/31 1351) BP: (110-136)/(48-70)  110/48 (01/31 1351) SpO2:  [96 %-99 %] 99 % (01/31 1351) Weight:  [61.2 kg] 61.2 kg (01/31 0957) Constitutional:  Alert and oriented, No acute distress HEENT: [place] AT, moist mucus membranes.  Trachea midline, no masses Cardiovascular: Regular rate and rhythm, no clubbing, cyanosis, or edema. Respiratory: Normal respiratory effort, lungs clear bilaterally GI: Abdomen is soft, RUQ tenderness, nondistended, no abdominal masses GU: No CVA tenderness Skin: No rashes, bruises or suspicious lesions Lymph: No cervical or inguinal adenopathy Neurologic: Grossly intact, no focal deficits, moving all 4 extremities Psychiatric: Normal mood and affect   Laboratory Data:  Recent Labs    12/08/21 1531 12/09/21 1015  WBC 16.2* 13.0*  HGB 15.8 15.2*  HCT 46.8* 46.1*   Recent Labs    12/08/21 1531 12/08/21 1809 12/09/21 1015  NA 142  --  136  K 4.3  --  4.3  CL 93*  --  101  CO2 24  --  24  GLUCOSE 101*  --  109*  BUN 22  --  28*  CREATININE 1.37* 1.50* 0.86  CALCIUM 9.2  --  9.1   No results for input(s): LABPT, INR in the last 72 hours. No results for input(s): LABURIN in the last 72 hours. Results for orders placed or performed during the hospital encounter of 12/09/21  Resp Panel by RT-PCR (Flu A&B, Covid) Nasopharyngeal Swab     Status: None   Collection Time: 12/09/21 10:15 AM   Specimen: Nasopharyngeal Swab; Nasopharyngeal(NP) swabs in  vial transport medium  Result Value Ref Range Status   SARS Coronavirus 2 by RT PCR NEGATIVE NEGATIVE Final    Comment: (NOTE) SARS-CoV-2 target nucleic acids are NOT DETECTED.  The SARS-CoV-2 RNA is generally detectable in upper respiratory specimens during the acute phase of infection. The lowest concentration of SARS-CoV-2 viral copies this assay can detect is 138 copies/mL. A negative result does not preclude SARS-Cov-2 infection and should not be used as the sole basis for treatment or other patient management decisions. A negative result  may occur with  improper specimen collection/handling, submission of specimen other than nasopharyngeal swab, presence of viral mutation(s) within the areas targeted by this assay, and inadequate number of viral copies(<138 copies/mL). A negative result must be combined with clinical observations, patient history, and epidemiological information. The expected result is Negative.  Fact Sheet for Patients:  EntrepreneurPulse.com.au  Fact Sheet for Healthcare Providers:  IncredibleEmployment.be  This test is no t yet approved or cleared by the [place] FDA and  has been authorized for detection and/or diagnosis of SARS-CoV-2 by FDA under an Emergency Use Authorization (EUA). This EUA will remain  in effect (meaning this test can be used) for the duration of the COVID-19 declaration under Section 564(b)(1) of the Act, 21 U.S.C.section 360bbb-3(b)(1), unless the authorization is terminated  or revoked sooner.       Influenza A by PCR NEGATIVE NEGATIVE Final   Influenza B by PCR NEGATIVE NEGATIVE Final    Comment: (NOTE) The Xpert Xpress SARS-CoV-2/FLU/RSV plus assay is intended as an aid in the diagnosis of influenza from Nasopharyngeal swab specimens and should not be used as a sole basis for treatment. Nasal washings and aspirates are unacceptable for Xpert Xpress SARS-CoV-2/FLU/RSV testing.  Fact Sheet for Patients: EntrepreneurPulse.com.au  Fact Sheet for Healthcare Providers: IncredibleEmployment.be  This test is not yet approved or cleared by the [place] FDA and has been authorized for detection and/or diagnosis of SARS-CoV-2 by FDA under an Emergency Use Authorization (EUA). This EUA will remain in effect (meaning this test can be used) for the duration of the COVID-19 declaration under Section 564(b)(1) of the Act, 21 U.S.C. section 360bbb-3(b)(1), unless the authorization is terminated  or revoked.  Performed at [facility], [address] [place], [place] [address]      Radiologic Imaging: CT images were personally reviewed and interpreted.  There is a 7 mm right mid ureteral calculus with moderate hydronephrosis/hydroureter.  There is a 3 mm calcification just proximal to the larger stone on the right.  There is fairly significant stone burden on the left side with a 10 mm left renal pelvic calculus without hydronephrosis.  There is stone extension into a left lower pole infundibulum with a partial staghorn configuration.  CT Abdomen Pelvis W Contrast  Result Date: 12/08/2021 CLINICAL DATA:  RLQ abdominal pain (Age >= 14y) EXAM: CT ABDOMEN AND PELVIS WITH CONTRAST TECHNIQUE: Multidetector CT imaging of the abdomen and pelvis was performed using the standard protocol following bolus administration of intravenous contrast. RADIATION DOSE REDUCTION: This exam was performed according to the departmental dose-optimization program which includes automated exposure control, adjustment of the mA and/or kV according to patient size and/or use of iterative reconstruction technique. CONTRAST:  18mL OMNIPAQUE IOHEXOL 300 MG/ML  SOLN COMPARISON:  None. FINDINGS: Lower chest: No acute abnormality. Hepatobiliary: No focal liver abnormality. Dilatation of the proximal common bile duct and central hepatic ducts. Tapering distally to the ampulla. Gallbladder is distended with wall thickening and  adjacent edema. Pancreas: Unremarkable. Spleen: Unremarkable. Adrenals/Urinary Tract: Adrenals are unremarkable. Bilateral renal calculi. There are several stones within the left collecting system with the largest measuring 8 mm no hydronephrosis. There is dilatation of the right ureter to the level of the pelvis where there is a 5 mm calculus. Bladder is unremarkable. Stomach/Bowel: Stomach is within normal limits. Bowel is normal in caliber. Normal appendix. Vascular/Lymphatic: Atherosclerosis.   No enlarged nodes. Reproductive: Uterus and bilateral adnexa are unremarkable. Other: No acute abnormality of the abdominal wall. Musculoskeletal: Lumbar spine degenerative changes. IMPRESSION: Acute cholecystitis. There is a biliary dilatation including the common bile duct, which tapers distally. No obstructing stone. 5 mm obstructing calculus of the mid to distal right ureter with proximal hydroureter but no hydronephrosis. Bilateral renal calculi including several within the left collecting system. These results were called by telephone at the time of interpretation on 12/08/2021 at 6:42 pm to provider Dr. Wynetta Emery, who verbally acknowledged these results. Electronically Signed   By: Macy Mis M.D.   On: 12/08/2021 18:56    Impression/Recommendation:   1.  Right mid ureteral calculus Obstructing right mid ureteral calculus.  The majority of her pain appears secondary to acute cholecystitis Based on stone size it is less likely she will pass the calculus We discussed management options of a trial of passage, stent placement and ureteroscopy with stone removal She is scheduled for robotic assisted laparoscopic cholecystectomy and there is no power source for the OR laser or backup laser in the robotic room I recommended cystoscopy with placement of right ureteral stent placement at the time of cholecystectomy We discussed the procedure including potential risks of bleeding, infection and rarely ureteral injury The need for definitive stone treatment in the future either shockwave lithotripsy or ureteroscopic stone removal was discussed  2.  Left nephrolithiasis Significant left stone burden without obstruction.  Based on central location she will eventually need treatment; optimally PCNL though staged ureteroscopy would be a secondary option.  All questions were answered and she desires to proceed   12/09/2021, 5:06 PM  John Giovanni,  MD

## 2021-12-09 NOTE — Anesthesia Procedure Notes (Signed)
Procedure Name: Intubation Date/Time: 12/09/2021 9:01 PM Performed by: Lendon Colonel, CRNA Pre-anesthesia Checklist: Patient identified, Patient being monitored, Timeout performed, Emergency Drugs available and Suction available Patient Re-evaluated:Patient Re-evaluated prior to induction Oxygen Delivery Method: Circle system utilized Preoxygenation: Pre-oxygenation with 100% oxygen Induction Type: IV induction Ventilation: Mask ventilation without difficulty Laryngoscope Size: 3 and McGraph Grade View: Grade I Tube type: Oral Tube size: 6.5 mm Number of attempts: 1 Airway Equipment and Method: Stylet Placement Confirmation: ETT inserted through vocal cords under direct vision, positive ETCO2 and breath sounds checked- equal and bilateral Secured at: 20 cm Tube secured with: Tape Dental Injury: Teeth and Oropharynx as per pre-operative assessment

## 2021-12-09 NOTE — Telephone Encounter (Signed)
FYI

## 2021-12-10 ENCOUNTER — Encounter: Payer: Self-pay | Admitting: General Surgery

## 2021-12-10 LAB — COMPREHENSIVE METABOLIC PANEL
ALT: 13 U/L (ref 0–44)
AST: 23 U/L (ref 15–41)
Albumin: 3.1 g/dL — ABNORMAL LOW (ref 3.5–5.0)
Alkaline Phosphatase: 60 U/L (ref 38–126)
Anion gap: 8 (ref 5–15)
BUN: 14 mg/dL (ref 8–23)
CO2: 24 mmol/L (ref 22–32)
Calcium: 7.9 mg/dL — ABNORMAL LOW (ref 8.9–10.3)
Chloride: 108 mmol/L (ref 98–111)
Creatinine, Ser: 0.67 mg/dL (ref 0.44–1.00)
GFR, Estimated: >60 mL/min (ref >60)
Glucose, Bld: 104 mg/dL — ABNORMAL HIGH (ref 70–99)
Potassium: 4.8 mmol/L (ref 3.5–5.1)
Sodium: 140 mmol/L (ref 135–145)
Total Bilirubin: 0.6 mg/dL (ref 0.3–1.2)
Total Protein: 6.3 g/dL — ABNORMAL LOW (ref 6.5–8.1)

## 2021-12-10 LAB — CBC
HCT: 43.2 % (ref 36.0–46.0)
Hemoglobin: 13.8 g/dL (ref 12.0–15.0)
MCH: 31.2 pg (ref 26.0–34.0)
MCHC: 31.9 g/dL (ref 30.0–36.0)
MCV: 97.7 fL (ref 80.0–100.0)
Platelets: 217 10*3/uL (ref 150–400)
RBC: 4.42 MIL/uL (ref 3.87–5.11)
RDW: 14.5 % (ref 11.5–15.5)
WBC: 10.8 10*3/uL — ABNORMAL HIGH (ref 4.0–10.5)
nRBC: 0 % (ref 0.0–0.2)

## 2021-12-10 LAB — HIV ANTIBODY (ROUTINE TESTING W REFLEX): HIV Screen 4th Generation wRfx: NONREACTIVE

## 2021-12-10 LAB — GLUCOSE, CAPILLARY: Glucose-Capillary: 147 mg/dL — ABNORMAL HIGH (ref 70–99)

## 2021-12-10 MED ORDER — SIMETHICONE 80 MG PO CHEW
80.0000 mg | CHEWABLE_TABLET | Freq: Four times a day (QID) | ORAL | Status: DC
  Administered 2021-12-10 – 2021-12-13 (×10): 80 mg via ORAL
  Filled 2021-12-10 (×10): qty 1

## 2021-12-10 MED ORDER — BISMUTH SUBSALICYLATE 262 MG PO CHEW
524.0000 mg | CHEWABLE_TABLET | Freq: Three times a day (TID) | ORAL | Status: DC
  Administered 2021-12-10 – 2021-12-13 (×9): 524 mg via ORAL
  Filled 2021-12-10 (×14): qty 2

## 2021-12-10 MED ORDER — SODIUM CHLORIDE 0.9 % IV SOLN: INTRAVENOUS | Status: DC | PRN

## 2021-12-10 MED ORDER — INSULIN ASPART 100 UNIT/ML IJ SOLN
0.0000 [IU] | INTRAMUSCULAR | Status: DC
  Administered 2021-12-10: 2 [IU] via SUBCUTANEOUS
  Filled 2021-12-10: qty 1

## 2021-12-10 MED ORDER — MORPHINE SULFATE (PF) 4 MG/ML IV SOLN
4.0000 mg | INTRAVENOUS | Status: DC | PRN
  Administered 2021-12-10 (×2): 4 mg via INTRAVENOUS
  Filled 2021-12-10 (×2): qty 1

## 2021-12-10 MED ORDER — OXYCODONE HCL 5 MG PO TABS: 10.0000 mg | ORAL_TABLET | ORAL | Status: DC | PRN

## 2021-12-10 MED ORDER — METHADONE HCL 10 MG/ML PO CONC
100.0000 mg | Freq: Every day | ORAL | Status: DC
  Administered 2021-12-10 – 2021-12-13 (×4): 100 mg via ORAL
  Filled 2021-12-10: qty 10
  Filled 2021-12-10: qty 5
  Filled 2021-12-10 (×3): qty 10

## 2021-12-10 NOTE — Anesthesia Postprocedure Evaluation (Signed)
Anesthesia Post Note  Patient: Kim Graham  Procedure(s) Performed: XI ROBOTIC ASSISTED LAPAROSCOPIC CHOLECYSTECTOMY INDOCYANINE GREEN FLUORESCENCE IMAGING (ICG) CYSTOSCOPY WITH RETROGRADE PYELOGRAM/URETERAL STENT PLACEMENT (Right)  Patient location during evaluation: PACU Anesthesia Type: General Level of consciousness: awake and alert Pain management: pain level controlled Vital Signs Assessment: post-procedure vital signs reviewed and stable Respiratory status: spontaneous breathing, nonlabored ventilation, respiratory function stable and patient connected to nasal cannula oxygen Cardiovascular status: blood pressure returned to baseline and stable Postop Assessment: no apparent nausea or vomiting Anesthetic complications: no   No notable events documented.   Last Vitals:  Vitals:   12/09/21 2330 12/10/21 0000  BP: (!) 131/54 124/61  Pulse: 100 (!) 103  Resp: 17 17  Temp:  36.6 C  SpO2: 95% 96%    Last Pain:  Vitals:   12/10/21 0000  TempSrc:   PainSc: 5                  Corinda Gubler

## 2021-12-10 NOTE — Progress Notes (Signed)
Spoke with Dr. Windell Moment, MD about pt's blood sugar of 147. Will cover with novolog. No new orders at this time.

## 2021-12-10 NOTE — Progress Notes (Addendum)
Spoke with Dr. Claudine Mouton, MD via OR RN about pt's JP drain output amount and color. Per MD will continue to monitor output. Orders to check CMP and CBC in the AM.

## 2021-12-10 NOTE — Progress Notes (Signed)
Patient ID: Kim Graham, female   DOB: 1950/12/30, 71 y.o.   MRN: [id number]     [facility] Day(s): 0.   Interval History: Patient seen and examined, no acute events or new complaints overnight. Patient reports having significant pain from surgery.  Not feeling great.  Denies any nausea or vomiting.  Vital signs in last 24 hours: [min-max] current  Temp:  [97.8 F (36.6 C)-98.7 F (37.1 C)] 98.5 F (36.9 C) (02/01 1132) Pulse Rate:  [80-104] 97 (02/01 1132) Resp:  [16-25] 18 (02/01 1132) BP: (108-155)/(54-81) 138/68 (02/01 1132) SpO2:  [92 %-100 %] 92 % (02/01 1132)     Height: 5\' 4"  (162.6 cm) Weight: 61.2 kg BMI (Calculated): 23.16   Physical Exam:  Constitutional: alert, cooperative and no distress  Respiratory: breathing non-labored at rest  Cardiovascular: regular rate and sinus rhythm  Gastrointestinal: soft, non-tender, and non-distended  Labs:  CBC Latest Ref Rng & Units 12/10/2021 12/09/2021 12/08/2021  WBC 4.0 - 10.5 K/uL 10.8(H) 13.0(H) 16.2(H)  Hemoglobin 12.0 - 15.0 g/dL 13.8 15.2(H) 15.8  Hematocrit 36.0 - 46.0 % 43.2 46.1(H) 46.8(H)  Platelets 150 - 400 K/uL 217 203 224   CMP Latest Ref Rng & Units 12/10/2021 12/09/2021 12/08/2021  Glucose 70 - 99 mg/dL 104(H) 109(H) -  BUN 8 - 23 mg/dL 14 28(H) -  Creatinine 0.44 - 1.00 mg/dL 0.67 0.86 1.50(H)  Sodium 135 - 145 mmol/L 140 136 -  Potassium 3.5 - 5.1 mmol/L 4.8 4.3 -  Chloride 98 - 111 mmol/L 108 101 -  CO2 22 - 32 mmol/L 24 24 -  Calcium 8.9 - 10.3 mg/dL 7.9(L) 9.1 -  Total Protein 6.5 - 8.1 g/dL 6.3(L) 7.0 -  Total Bilirubin 0.3 - 1.2 mg/dL 0.6 0.8 -  Alkaline Phos 38 - 126 U/L 60 73 -  AST 15 - 41 U/L 23 24 -  ALT 0 - 44 U/L 13 16 -    Imaging studies: No new pertinent imaging studies   Assessment/Plan:  71 y.o. female with acute cholecystitis, complicated by pertinent comorbidities including COPD.  Acute cholecystitis -S/p robotic assisted laparoscopic partial cholecystectomy  postop day #1 -Expected postoperative bile leak from gallbladder stump.  High bilious output - Discussed with Dr. Allen Norris to evaluate for ERCP. Will start clear liquid diet today and NPO after midnight.  - Patient anxious about her methadone. Will discontinue Morphine and restart methadone.  - Will continue IV abx therapy due to cholecystitis unable to get the whole gallbladder out and bile leak.   Arnold Long, MD

## 2021-12-10 NOTE — Progress Notes (Signed)
°  Chaplain On-Call provided spiritual and emotional support and prayer with the patient, in response to Spiritual Care Consult Order from [place].  Patient stated her gratitude for the visit.  Chaplain Pollyann Samples M.Div., [facility]

## 2021-12-11 ENCOUNTER — Encounter
Admission: EM | Disposition: A | Discharge status: Home / Self Care | Payer: Self-pay | Source: Home / Self Care | Attending: General Surgery

## 2021-12-11 ENCOUNTER — Inpatient Hospital Stay: Payer: 59

## 2021-12-11 ENCOUNTER — Encounter: Payer: Self-pay | Admitting: Gastroenterology

## 2021-12-11 DIAGNOSIS — K9189 Other postprocedural complications and disorders of digestive system: Secondary | ICD-10-CM | POA: Diagnosis not present

## 2021-12-11 DIAGNOSIS — N132 Hydronephrosis with renal and ureteral calculous obstruction: Secondary | ICD-10-CM | POA: Diagnosis present

## 2021-12-11 DIAGNOSIS — Y838 Other surgical procedures as the cause of abnormal reaction of the patient, or of later complication, without mention of misadventure at the time of the procedure: Secondary | ICD-10-CM | POA: Diagnosis not present

## 2021-12-11 DIAGNOSIS — E119 Type 2 diabetes mellitus without complications: Secondary | ICD-10-CM | POA: Diagnosis present

## 2021-12-11 DIAGNOSIS — J449 Chronic obstructive pulmonary disease, unspecified: Secondary | ICD-10-CM | POA: Diagnosis present

## 2021-12-11 DIAGNOSIS — Z833 Family history of diabetes mellitus: Secondary | ICD-10-CM | POA: Diagnosis not present

## 2021-12-11 DIAGNOSIS — Z8249 Family history of ischemic heart disease and other diseases of the circulatory system: Secondary | ICD-10-CM | POA: Diagnosis not present

## 2021-12-11 DIAGNOSIS — K660 Peritoneal adhesions (postprocedural) (postinfection): Secondary | ICD-10-CM | POA: Diagnosis present

## 2021-12-11 DIAGNOSIS — F17210 Nicotine dependence, cigarettes, uncomplicated: Secondary | ICD-10-CM | POA: Diagnosis present

## 2021-12-11 DIAGNOSIS — K8000 Calculus of gallbladder with acute cholecystitis without obstruction: Secondary | ICD-10-CM | POA: Diagnosis present

## 2021-12-11 DIAGNOSIS — Z79899 Other long term (current) drug therapy: Secondary | ICD-10-CM | POA: Diagnosis not present

## 2021-12-11 DIAGNOSIS — Z20822 Contact with and (suspected) exposure to covid-19: Secondary | ICD-10-CM | POA: Diagnosis present

## 2021-12-11 DIAGNOSIS — K810 Acute cholecystitis: Secondary | ICD-10-CM | POA: Diagnosis present

## 2021-12-11 HISTORY — PX: ENDOSCOPIC RETROGRADE CHOLANGIOPANCREATOGRAPHY (ERCP) WITH PROPOFOL: SHX5810

## 2021-12-11 LAB — CBC
HCT: 37.8 % (ref 36.0–46.0)
Hemoglobin: 12 g/dL (ref 12.0–15.0)
MCH: 31 pg (ref 26.0–34.0)
MCHC: 31.7 g/dL (ref 30.0–36.0)
MCV: 97.7 fL (ref 80.0–100.0)
Platelets: 185 10*3/uL (ref 150–400)
RBC: 3.87 MIL/uL (ref 3.87–5.11)
RDW: 14.4 % (ref 11.5–15.5)
WBC: 7.8 10*3/uL (ref 4.0–10.5)
nRBC: 0 % (ref 0.0–0.2)

## 2021-12-11 LAB — COMPREHENSIVE METABOLIC PANEL
ALT: 11 U/L (ref 0–44)
AST: 19 U/L (ref 15–41)
Albumin: 2.7 g/dL — ABNORMAL LOW (ref 3.5–5.0)
Alkaline Phosphatase: 55 U/L (ref 38–126)
Anion gap: 4 — ABNORMAL LOW (ref 5–15)
BUN: 11 mg/dL (ref 8–23)
CO2: 25 mmol/L (ref 22–32)
Calcium: 8.1 mg/dL — ABNORMAL LOW (ref 8.9–10.3)
Chloride: 109 mmol/L (ref 98–111)
Creatinine, Ser: 0.66 mg/dL (ref 0.44–1.00)
GFR, Estimated: >60 mL/min (ref >60)
Glucose, Bld: 99 mg/dL (ref 70–99)
Potassium: 3.9 mmol/L (ref 3.5–5.1)
Sodium: 138 mmol/L (ref 135–145)
Total Bilirubin: 0.8 mg/dL (ref 0.3–1.2)
Total Protein: 5.6 g/dL — ABNORMAL LOW (ref 6.5–8.1)

## 2021-12-11 LAB — SURGICAL PATHOLOGY

## 2021-12-11 LAB — HEMOGLOBIN A1C
Hgb A1c MFr Bld: 6.4 % — ABNORMAL HIGH (ref 4.8–5.6)
Mean Plasma Glucose: 137 mg/dL

## 2021-12-11 SURGERY — ENDOSCOPIC RETROGRADE CHOLANGIOPANCREATOGRAPHY (ERCP) WITH PROPOFOL
Anesthesia: General

## 2021-12-11 MED ORDER — FENTANYL CITRATE PF 50 MCG/ML IJ SOSY
PREFILLED_SYRINGE | INTRAMUSCULAR | Status: AC
  Administered 2021-12-11: 25 ug via INTRAVENOUS
  Filled 2021-12-11: qty 1

## 2021-12-11 MED ORDER — INDOMETHACIN 50 MG RE SUPP
RECTAL | Status: DC | PRN
  Administered 2021-12-11: 100 mg via RECTAL

## 2021-12-11 MED ORDER — MIDAZOLAM HCL 2 MG/2ML IJ SOLN
INTRAMUSCULAR | Status: AC
  Filled 2021-12-11: qty 2

## 2021-12-11 MED ORDER — LACTATED RINGERS IV SOLN: Freq: Once | INTRAVENOUS | Status: AC

## 2021-12-11 MED ORDER — KETAMINE HCL 10 MG/ML IJ SOLN
INTRAMUSCULAR | Status: DC | PRN
  Administered 2021-12-11: 15 mg via INTRAVENOUS

## 2021-12-11 MED ORDER — IOHEXOL 300 MG/ML  SOLN
INTRAMUSCULAR | Status: DC | PRN
  Administered 2021-12-11: 10 mL

## 2021-12-11 MED ORDER — FENTANYL CITRATE (PF) 100 MCG/2ML IJ SOLN
INTRAMUSCULAR | Status: DC | PRN
  Administered 2021-12-11 (×2): 50 ug via INTRAVENOUS

## 2021-12-11 MED ORDER — KETAMINE HCL 50 MG/5ML IJ SOSY
PREFILLED_SYRINGE | INTRAMUSCULAR | Status: AC
  Filled 2021-12-11: qty 5

## 2021-12-11 MED ORDER — PROPOFOL 500 MG/50ML IV EMUL
INTRAVENOUS | Status: DC | PRN
  Administered 2021-12-11: 100 ug/kg/min via INTRAVENOUS

## 2021-12-11 MED ORDER — MIDAZOLAM HCL 2 MG/2ML IJ SOLN
INTRAMUSCULAR | Status: DC | PRN
  Administered 2021-12-11: 2 mg via INTRAVENOUS

## 2021-12-11 MED ORDER — FENTANYL CITRATE (PF) 100 MCG/2ML IJ SOLN
INTRAMUSCULAR | Status: AC
  Filled 2021-12-11: qty 2

## 2021-12-11 MED ORDER — INDOMETHACIN 50 MG RE SUPP: 100.0000 mg | Freq: Once | RECTAL | Status: DC

## 2021-12-11 MED ORDER — INDOMETHACIN 50 MG RE SUPP
RECTAL | Status: AC
  Filled 2021-12-11: qty 2

## 2021-12-11 MED ORDER — PROPOFOL 10 MG/ML IV BOLUS
INTRAVENOUS | Status: DC | PRN
  Administered 2021-12-11: 40 mg via INTRAVENOUS

## 2021-12-11 MED ORDER — LIDOCAINE HCL (CARDIAC) PF 100 MG/5ML IV SOSY
PREFILLED_SYRINGE | INTRAVENOUS | Status: DC | PRN
  Administered 2021-12-11: 50 mg via INTRAVENOUS

## 2021-12-11 MED ORDER — PROPOFOL 500 MG/50ML IV EMUL
INTRAVENOUS | Status: AC
  Filled 2021-12-11: qty 50

## 2021-12-11 MED ORDER — LACTATED RINGERS IV SOLN: INTRAVENOUS | Status: DC | PRN

## 2021-12-11 MED ORDER — FENTANYL CITRATE PF 50 MCG/ML IJ SOSY
25.0000 ug | PREFILLED_SYRINGE | INTRAMUSCULAR | Status: DC | PRN
  Administered 2021-12-11: 25 ug via INTRAVENOUS

## 2021-12-11 NOTE — Progress Notes (Signed)
Patient complains of level 10 abdominal pain, left upper side, crampy/stabbing in nature.  Repositioned to left side for comfort, which did ease pain slightly.  Vital signs remain stable.  Discussed with Dr Fraser Din (Anesthesiologist), order for Fentanyl 25-4mcg q5 minutes placed.  Patient given at this time.  Will continue to assess.

## 2021-12-11 NOTE — Progress Notes (Signed)
Patient continues to complain of level 8 pain to left side of her abdomen.  Fentanyl 15mcg given per previous anesthesia order.  Patient is resting quietly, breathing is easy and non-labored, no facial grimacing.  Will call report to [place], [place].  Patient offered clear liquids but declined at this time.

## 2021-12-11 NOTE — Progress Notes (Signed)
Patient ID: Kim Graham, female   DOB: 06/08/1951, 71 y.o.   MRN: [id number]     [facility] [facility] Day(s): 0.   Interval History: Patient seen and examined in the endoscopy recovery area, no acute events or new complaints overnight. Patient reports feeling better than yesterday. Pain improving. Denies nausea.  Vital signs in last 24 hours: [min-max] current  Temp:  [97.6 F (36.4 C)-98.8 F (37.1 C)] 97.6 F (36.4 C) (02/02 1038) Pulse Rate:  [71-98] 71 (02/02 1108) Resp:  [15-22] 22 (02/02 1108) BP: (123-158)/(63-78) 158/78 (02/02 1108) SpO2:  [92 %-97 %] 97 % (02/02 1108)     Height: 5\' 4"  (162.6 cm) Weight: 61.2 kg BMI (Calculated): 23.16   Physical Exam:  Constitutional: alert, cooperative and no distress  Respiratory: breathing non-labored at rest  Cardiovascular: regular rate and sinus rhythm  Gastrointestinal: soft, non-tender, and non-distended. Drain with bilious output.  Labs:  CBC Latest Ref Rng & Units 12/11/2021 12/10/2021 12/09/2021  WBC 4.0 - 10.5 K/uL 7.8 10.8(H) 13.0(H)  Hemoglobin 12.0 - 15.0 g/dL 12.0 13.8 15.2(H)  Hematocrit 36.0 - 46.0 % 37.8 43.2 46.1(H)  Platelets 150 - 400 K/uL 185 217 203   CMP Latest Ref Rng & Units 12/11/2021 12/10/2021 12/09/2021  Glucose 70 - 99 mg/dL 99 104(H) 109(H)  BUN 8 - 23 mg/dL 11 14 28(H)  Creatinine 0.44 - 1.00 mg/dL 0.66 0.67 0.86  Sodium 135 - 145 mmol/L 138 140 136  Potassium 3.5 - 5.1 mmol/L 3.9 4.8 4.3  Chloride 98 - 111 mmol/L 109 108 101  CO2 22 - 32 mmol/L 25 24 24   Calcium 8.9 - 10.3 mg/dL 8.1(L) 7.9(L) 9.1  Total Protein 6.5 - 8.1 g/dL 5.6(L) 6.3(L) 7.0  Total Bilirubin 0.3 - 1.2 mg/dL 0.8 0.6 0.8  Alkaline Phos 38 - 126 U/L 55 60 73  AST 15 - 41 U/L 19 23 24   ALT 0 - 44 U/L 11 13 16     Imaging studies: No new pertinent imaging studies   Assessment/Plan:  71 y.o. female with acute cholecystitis, complicated by pertinent comorbidities including COPD.   Acute cholecystitis -S/p robotic assisted  laparoscopic partial cholecystectomy postop day #2 -Expected postoperative bile leak from gallbladder stump.  High bilious output. ERCP done today - Continue methadone - Will continue IV abx therapy due to cholecystitis unable to get the whole gallbladder out and bile leak.  - Restart clear liquid diet today and will assess for toleration advancing if tolerated.  -Encourage patient to ambulate  Arnold Long, MD

## 2021-12-11 NOTE — Op Note (Signed)
[facility] Gastroenterology Patient Name: Kim Graham Procedure Date: 12/11/2021 8:36 AM MRN: [id number] Account #: [id number] Date of Birth: 09-26-1951 Admit Type: Inpatient Age: 71 Room: [facility] ENDO ROOM 4 Gender: Female Note Status: Finalized Instrument Name: Selinda Eon [id number] Procedure:             ERCP Indications:           Bile leak Providers:             Lucilla Lame MD, MD Medicines:             Propofol per Anesthesia Complications:         No immediate complications. Procedure:             Pre-Anesthesia Assessment:                        - Prior to the procedure, a History and Physical was                         performed, and patient medications and allergies were                         reviewed. The patient's tolerance of previous                         anesthesia was also reviewed. The risks and benefits                         of the procedure and the sedation options and risks                         were discussed with the patient. All questions were                         answered, and informed consent was obtained. Prior                         Anticoagulants: The patient has taken no previous                         anticoagulant or antiplatelet agents. ASA Grade                         Assessment: II - A patient with mild systemic disease.                         After reviewing the risks and benefits, the patient                         was deemed in satisfactory condition to undergo the                         procedure.                        After obtaining informed consent, the scope was passed                         under direct vision. Throughout the procedure, the  patient's blood pressure, pulse, and oxygen                         saturations were monitored continuously. The                         Duodenoscope was introduced through the mouth, and                         used to inject contrast into and used  to cannulate the                         bile duct. The ERCP was accomplished without                         difficulty. The patient tolerated the procedure well. Findings:      A scout film of the abdomen was obtained. One percutaneous drain ending       in the Right upper quadrant was seen. The esophagus was successfully       intubated under direct vision. The scope was advanced to a normal major       papilla in the descending duodenum without detailed examination of the       pharynx, larynx and associated structures, and upper GI tract. The upper       GI tract was grossly normal. The bile duct was deeply cannulated with       the short-nosed traction sphincterotome. Contrast was injected. I       personally interpreted the bile duct images. There was brisk flow of       contrast through the ducts. Image quality was excellent. Contrast       extended to the hepatic ducts. The main bile duct was diffusely dilated.       The largest diameter was 2mm. A wire was passed into the biliary tree.       A 7 mm biliary sphincterotomy was made with a traction (standard)       sphincterotome using ERBE electrocautery. There was no       post-sphincterotomy bleeding. One 10 Fr by 9 cm stent with a single       external flap and a single internal flap was placed 7 cm into the common       bile duct. Bile flowed through the stent. The stent was in good       position. There was back flow of contrast into the PD on injection of       contrast. Impression:            - The entire main bile duct was dilated.                        - A biliary sphincterotomy was performed.                        - One stent was placed into the common bile duct. Recommendation:        - Return patient to hospital ward for ongoing care.                        - Clear liquid diet.                        -  Watch for pancreatitis, bleeding, perforation, and                         cholangitis.                        -  Repeat ERCP in 3 months to remove stent. Procedure Code(s):     --- Professional ---                        [phone], Endoscopic retrograde cholangiopancreatography                         (ERCP); with placement of endoscopic stent into                         biliary or pancreatic duct, including pre- and                         post-dilation and guide wire passage, when performed,                         including sphincterotomy, when performed, each stent                        [id number], Endoscopic catheterization of the biliary                         ductal system, radiological supervision and                         interpretation Diagnosis Code(s):     --- Professional ---                        K83.8, Other specified diseases of biliary tract CPT copyright 2019 American Medical Association. All rights reserved. The codes documented in this report are preliminary and upon coder review may  be revised to meet current compliance requirements. Lucilla Lame MD, MD 12/11/2021 10:32:03 AM This report has been signed electronically. Number of Addenda: 0 Note Initiated On: 12/11/2021 8:36 AM Estimated Blood Loss:  Estimated blood loss: none.      [facility]

## 2021-12-11 NOTE — Progress Notes (Signed)
°   12/11/21 1550  Closed System Drain 1 Medial;Left LLQ Bulb (JP) 19 Fr.  Placement Date/Time: 12/09/21 2227   Person Inserting LDA: Cintron-Diaz  Tube Number: 1  Orientation: Medial;Left  Location: LLQ  Drain Tube Type: Bulb (JP)  Size (Fr.): 19 Fr.  Dressing Status Clean;Dry;Intact (changed by Lurline Idol, MD)  Drainage Appearance Brown;Serosanguineous;Bloody  Status To suction (Charged)  Output (mL) 80 mL   Edgardo, MD to bedside at 1550 to assess pt JP dsg and drain. Dsg noted to have sanguinous drainage and JP drain noted to have brown/serosanguinous/sanguinous. Edgardo assessed site and changed dsg. No new orders. Will continue to monitor.

## 2021-12-11 NOTE — Anesthesia Preprocedure Evaluation (Signed)
Anesthesia Evaluation  Patient identified by MRN, date of birth, ID band Patient awake    Reviewed: Allergy & Precautions, NPO status , Patient's Chart, lab work & pertinent test results  History of Anesthesia Complications Negative for: history of anesthetic complications  Airway Mallampati: II  TM Distance: >3 FB Neck ROM: Full    Dental  (+) Edentulous Upper, Poor Dentition, Missing, Chipped, Partial Lower   Pulmonary neg pulmonary ROS, neg sleep apnea, COPD,  COPD inhaler, Current Smoker and Patient abstained from smoking.,  Averages inhaler use once per day. Not on home O2. Breathing feels at baseline today   Pulmonary exam normal breath sounds clear to auscultation       Cardiovascular Exercise Tolerance: Good METS(-) hypertension(-) CAD and (-) Past MI negative cardio ROS  (-) dysrhythmias  Rhythm:Regular Rate:Normal - Systolic murmurs TTE [id number]: 1. Left ventricular ejection fraction, by estimation, is 60 to 65%. The  left ventricle has normal function. The left ventricle has no regional  wall motion abnormalities. Left ventricular diastolic parameters are  consistent with Grade I diastolic  dysfunction (impaired relaxation).  2. Right ventricular systolic function is normal. The right ventricular  size is normal. There is mildly elevated pulmonary artery systolic  pressure. The estimated right ventricular systolic pressure is [id number] mmHg.  3. The mitral valve is normal in structure. No evidence of mitral valve  regurgitation. No evidence of mitral stenosis.  4. The aortic valve is normal in structure. Aortic valve regurgitation is  not visualized. Mild to moderate aortic valve sclerosis/calcification is  present, without any evidence of aortic stenosis.  5. The inferior vena cava is normal in size with <50% respiratory  variability, suggesting right atrial pressure of 8 mmHg.    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, On Methadone)  IV drug use, Hepatitis - (Treated), CFormer substance abuser, now on chronic methadone   Endo/Other  negative endocrine ROSdiabetes  Renal/GU negative Renal ROSNew obstructing kidney stone without renal failure  negative genitourinary   Musculoskeletal  (+) narcotic dependent  Abdominal   Peds negative pediatric ROS (+)  Hematology negative hematology ROS (+)   Anesthesia Other Findings Past Medical History: No date: COPD (chronic obstructive pulmonary disease) ([facility]) 04/15/2018: Diabetes mellitus type 2, diet-controlled ([facility])  Reproductive/Obstetrics negative OB ROS                             Anesthesia Physical  Anesthesia Plan  ASA: 3  Anesthesia Plan: General   Post-op Pain Management: Ketamine IV   Induction: Intravenous  PONV Risk Score and Plan: 4 or greater and TIVA and Propofol infusion  Airway Management Planned: Natural Airway and Nasal Cannula  Additional Equipment: None  Intra-op Plan:   Post-operative Plan:   Informed Consent: I have reviewed the patients History and Physical, chart, labs and discussed the procedure including the risks, benefits and alternatives for the proposed anesthesia with the patient or authorized representative who has indicated his/her understanding and acceptance.     Dental advisory given  Plan Discussed with: CRNA, Surgeon and Anesthesiologist  Anesthesia Plan Comments: (Denies N/V. Discussed risks of anesthesia with patient, including PONV, sore throat, lip/dental/eye damage. Rare risks discussed as well, such as cardiorespiratory and neurological sequelae, and allergic reactions. Discussed the role of CRNA in patient's perioperative care. Patient understands.)  Anesthesia Quick Evaluation

## 2021-12-11 NOTE — Transfer of Care (Signed)
Immediate Anesthesia Transfer of Care Note  Patient: Kim Graham  Procedure(s) Performed: ENDOSCOPIC RETROGRADE CHOLANGIOPANCREATOGRAPHY (ERCP) WITH PROPOFOL  Patient Location: PACU  Anesthesia Type:MAC  Level of Consciousness: awake and alert   Airway & Oxygen Therapy: Patient Spontanous Breathing  Post-op Assessment: Report given to RN and Post -op Vital signs reviewed and stable  Post vital signs: Reviewed and stable  Last Vitals:  Vitals Value Taken Time  BP 158/67 12/11/21 1039  Temp 36.4 C 12/11/21 1038  Pulse 95 12/11/21 1040  Resp 14 12/11/21 1040  SpO2 98 % 12/11/21 1040  Vitals shown include unvalidated device data.  Last Pain:  Vitals:   12/11/21 1038  TempSrc: Temporal  PainSc: Asleep         Complications: No notable events documented.

## 2021-12-11 NOTE — Progress Notes (Signed)
Spoke with Infection Prevention on call specialist about patients respiratory panel result that detected [place]. Infection prevention specialist stated that the patient did not have to be on isolation, continue standard precautions.

## 2021-12-11 NOTE — Anesthesia Postprocedure Evaluation (Signed)
Anesthesia Post Note  Patient: [name]  Procedure(s) Performed: ENDOSCOPIC RETROGRADE CHOLANGIOPANCREATOGRAPHY (ERCP) WITH PROPOFOL  Patient location during evaluation: PACU Anesthesia Type: General Level of consciousness: awake and alert, awake and oriented Pain management: pain level controlled Vital Signs Assessment: post-procedure vital signs reviewed and stable Respiratory status: spontaneous breathing, nonlabored ventilation and respiratory function stable Cardiovascular status: blood pressure returned to baseline and stable Postop Assessment: no apparent nausea or vomiting Anesthetic complications: no   No notable events documented.   Last Vitals:  Vitals:   12/11/21 1125 12/11/21 1201  BP:  (!) 147/73  Pulse:  83  Resp:  18  Temp:  36.9 C  SpO2: 100% 92%    Last Pain:  Vitals:   12/11/21 1201  TempSrc: Oral  PainSc:                  Manfred Arch

## 2021-12-12 NOTE — Evaluation (Signed)
Physical Therapy Evaluation Patient Details Name: Kim Graham MRN: [id number] DOB: 07/20/1951 Today's Date: 12/12/2021  History of Present Illness  The patient is a 71 yo female s/p cholecystectomy. PMH of COPD, depression, DM  Clinical Impression  Patient alert, oriented x4, reported 4/10 abdominal pain. At baseline the patient reported she lives alone, no AD, independent in ADLs/IADLs, no falls in the last 6 months.  The patient was instructed in log roll technique, supervision to complete, no physical assistance needed. Sit <> stand with RW and supervision, cued for hand placement. She was able to ambulate 164ft and RW, supervision. She also performed stair navigation (8 steps with one rail) with CGA, steady, safe.  Overall the patient demonstrated deficits (see "PT Problem List") that impede the patient's functional abilities, safety, and mobility and would benefit from skilled PT intervention. Recommendation is HHPT to maximize function and return to PLOF.         Recommendations for follow up therapy are one component of a multi-disciplinary discharge planning process, led by the attending physician.  Recommendations may be updated based on patient status, additional functional criteria and insurance authorization.  Follow Up Recommendations Home health PT    Assistance Recommended at Discharge Intermittent Supervision/Assistance  Patient can return home with the following  Assistance with cooking/housework;Assist for transportation;Help with stairs or ramp for entrance    Equipment Recommendations Rolling walker (2 wheels)  Recommendations for Other Services       Functional Status Assessment Patient has had a recent decline in their functional status and demonstrates the ability to make significant improvements in function in a reasonable and predictable amount of time.     Precautions / Restrictions Precautions Precautions: Fall Precaution Comments: JP  drain Restrictions Weight Bearing Restrictions: No      Mobility  Bed Mobility Overal bed mobility: Needs Assistance Bed Mobility: Rolling, Sidelying to Sit, Sit to Sidelying Rolling: Supervision Sidelying to sit: Supervision   Sit to supine: Supervision Sit to sidelying: Supervision General bed mobility comments: cued for log rolling technique, supervision to complete    Transfers Overall transfer level: Needs assistance Equipment used: Rolling walker (2 wheels) Transfers: Sit to/from Stand Sit to Stand: Supervision           General transfer comment: cued for hand placement    Ambulation/Gait Ambulation/Gait assistance: Supervision Gait Distance (Feet): 120 Feet Assistive device: Rolling walker (2 wheels)         General Gait Details: cued for RW management and hand position, no LOB noted, good cadence/step clearance  Stairs Stairs: Yes Stairs assistance: Min guard Stair Management: Step to pattern, One rail Left Number of Stairs: 8 General stair comments: steady, safe  Wheelchair Mobility    Modified Rankin (Stroke Patients Only)       Balance Overall balance assessment: Needs assistance Sitting-balance support: Feet supported Sitting balance-Leahy Scale: Good       Standing balance-Leahy Scale: Good                               Pertinent Vitals/Pain Pain Assessment Pain Assessment: 0-10 Pain Score: 4  Pain Location: abdomen Pain Descriptors / Indicators: Aching Pain Intervention(s): Limited activity within patient's tolerance, Monitored during session, Repositioned    Home Living Family/patient expects to be discharged to:: Private residence Living Arrangements: Alone Available Help at Discharge: Friend(s);Available PRN/intermittently Type of Home: House Home Access: Stairs to enter Entrance Stairs-Rails: Left Entrance Stairs-Number of Steps:  8   Home Layout: One level Home Equipment: None      Prior Function  Prior Level of Function : Independent/Modified Independent                     Hand Dominance        Extremity/Trunk Assessment   Upper Extremity Assessment Upper Extremity Assessment: Overall WFL for tasks assessed    Lower Extremity Assessment Lower Extremity Assessment: Overall WFL for tasks assessed    Cervical / Trunk Assessment Cervical / Trunk Assessment: Other exceptions Cervical / Trunk Exceptions: abdominal surgery  Communication   Communication: No difficulties  Cognition Arousal/Alertness: Awake/alert Behavior During Therapy: WFL for tasks assessed/performed Overall Cognitive Status: Within Functional Limits for tasks assessed                                          General Comments      Exercises     Assessment/Plan    PT Assessment Patient needs continued PT services  PT Problem List Decreased strength;Decreased mobility;Decreased range of motion;Decreased activity tolerance;Decreased balance;Pain;Decreased knowledge of use of DME       PT Treatment Interventions DME instruction;Therapeutic exercise;Gait training;Balance training;Stair training;Neuromuscular re-education;Functional mobility training;Therapeutic activities;Patient/family education    PT Goals (Current goals can be found in the Care Plan section)  Acute Rehab PT Goals Patient Stated Goal: to go home PT Goal Formulation: With patient Time For Goal Achievement: 12/26/21 Potential to Achieve Goals: Good    Frequency Min 2X/week     Co-evaluation               AM-PAC PT "6 Clicks" Mobility  Outcome Measure Help needed turning from your back to your side while in a flat bed without using bedrails?: None Help needed moving from lying on your back to sitting on the side of a flat bed without using bedrails?: None Help needed moving to and from a bed to a chair (including a wheelchair)?: None Help needed standing up from a chair using your arms (e.g.,  wheelchair or bedside chair)?: None Help needed to walk in hospital room?: None Help needed climbing 3-5 steps with a railing? : A Little 6 Click Score: 23    End of Session Equipment Utilized During Treatment: Gait belt Activity Tolerance: Patient tolerated treatment well Patient left: in bed;with call bell/phone within reach Nurse Communication: Mobility status PT Visit Diagnosis: Other abnormalities of gait and mobility (R26.89);Pain Pain - Right/Left:  (midline) Pain - part of body:  (abdominal pain)    Time: 4166-0630 PT Time Calculation (min) (ACUTE ONLY): 26 min   Charges:   PT Evaluation $PT Eval Low Complexity: 1 Low PT Treatments $Gait Training: 8-22 mins $Therapeutic Exercise: 8-22 mins       Olga Coaster PT, DPT 3:37 PM,12/12/21

## 2021-12-12 NOTE — Progress Notes (Signed)
Pt's best friend tabitha Tamala Julian called and notified this nurse of concerns for pt's safety at home. Pt lives alone with multiple dogs and has to take stairs to get into her home. MD notified of and orders for PT consult placed to evaluate pt's mobility. Pt can ambulate with contact assist (and completed 1 lap around nurses station this am and tolerated it well) but unsure of her ability to climb 8 stairs.

## 2021-12-12 NOTE — TOC Progression Note (Signed)
Transition of Care [facility]) - Progression Note    Patient Details  Name: Kim Graham MRN: [id number] Date of Birth: 1951-03-27  [facility] CM/SW Contact  [name], RN Phone Number: 12/12/2021, 4:10 PM  Clinical Narrative:   Patient lives alone, [employer] will deliver a Rw to the bedside to take home TOC CM reached out to [place] at [place], Kandee Keen [facility] [place] [facility] [place], Barbara Cower at [place] [facility] [place], [place] at [place],  and [place] at [place] [facility] requesting HH PT, [facility] is Not INN  [facility], and [facility] are unable to accept the patient, [facility] is unable to accept the patient, [facility] stated that they would with a SOC on Tuesday,  I spoke with the patient and explained and she stated that that would be good.  No additional needs         Expected Discharge Plan and Services                                                 Social Determinants of Health (SDOH) Interventions    Readmission Risk Interventions No flowsheet data found.

## 2021-12-12 NOTE — Progress Notes (Signed)
Patient ID: Kim Graham, female   DOB: Jan 06, 1951, 71 y.o.   MRN: [id number]     [facility] Day(s): 1.   Interval History: Patient seen and examined, no acute events or new complaints overnight. Patient reports continued having significant pain in upper abdomen.  Patient has been able to ambulate adequately.  Still on full liquid diet.  No nausea.  Vital signs in last 24 hours: [min-max] current  Temp:  [98.5 F (36.9 C)-99.5 F (37.5 C)] 98.5 F (36.9 C) (02/03 1132) Pulse Rate:  [85-98] 87 (02/03 1132) Resp:  [14-20] 17 (02/03 1132) BP: (117-133)/(52-64) 121/59 (02/03 1132) SpO2:  [91 %-96 %] 93 % (02/03 1132)     Height: 5\' 4"  (162.6 cm) Weight: 61.2 kg BMI (Calculated): 23.16   Physical Exam:  Constitutional: alert, cooperative and no distress  Respiratory: breathing non-labored at rest  Cardiovascular: regular rate and sinus rhythm  Gastrointestinal: soft, moderate-tender, and non-distended  Labs:  CBC Latest Ref Rng & Units 12/11/2021 12/10/2021 12/09/2021  WBC 4.0 - 10.5 K/uL 7.8 10.8(H) 13.0(H)  Hemoglobin 12.0 - 15.0 g/dL 12.0 13.8 15.2(H)  Hematocrit 36.0 - 46.0 % 37.8 43.2 46.1(H)  Platelets 150 - 400 K/uL 185 217 203   CMP Latest Ref Rng & Units 12/11/2021 12/10/2021 12/09/2021  Glucose 70 - 99 mg/dL 99 104(H) 109(H)  BUN 8 - 23 mg/dL 11 14 28(H)  Creatinine 0.44 - 1.00 mg/dL 0.66 0.67 0.86  Sodium 135 - 145 mmol/L 138 140 136  Potassium 3.5 - 5.1 mmol/L 3.9 4.8 4.3  Chloride 98 - 111 mmol/L 109 108 101  CO2 22 - 32 mmol/L 25 24 24   Calcium 8.9 - 10.3 mg/dL 8.1(L) 7.9(L) 9.1  Total Protein 6.5 - 8.1 g/dL 5.6(L) 6.3(L) 7.0  Total Bilirubin 0.3 - 1.2 mg/dL 0.8 0.6 0.8  Alkaline Phos 38 - 126 U/L 55 60 73  AST 15 - 41 U/L 19 23 24   ALT 0 - 44 U/L 11 13 16     Imaging studies: No new pertinent imaging studies   Assessment/Plan:  71 y.o. female with acute cholecystitis, complicated by pertinent comorbidities including COPD.   Acute  cholecystitis -S/p robotic assisted laparoscopic partial cholecystectomy postop day #3 -Expected postoperative bile leak from gallbladder stump status post ERCP postop day #1 - Continue methadone - Will continue IV abx therapy due to cholecystitis unable to get the whole gallbladder out and bile leak.  -Still with significant pain.  We will continue with pain management -We will continue charting drain output to see if he can be removed before discharge -We will advance her to soft diet and assess for toleration. -Encourage patient to ambulate  Arnold Long, MD

## 2021-12-13 NOTE — Progress Notes (Signed)
Pt discharged home. Discharge instructions, prescriptions, and follow up appointments given to and reviewed with pt. Pt verbalized understanding. Escorted by axillary.   

## 2021-12-13 NOTE — TOC Progression Note (Addendum)
Transition of Care [facility]) - Progression Note    Patient Details  Name: Kim Graham MRN: [id number] Date of Birth: 04/25/1951  [facility] CM/SW Contact  Merrily [place], [place] Phone Number: 12/13/2021, 12:09 PM  Clinical Narrative:    [facility] stated that they would with a SOC on Tuesday. CSW notified Kandee Keen with [facility] of patient discharge.   Jasmine with [employer] confirmed walker was received yesterday.   No additional TOC needs.      Expected Discharge Plan and Services           Expected Discharge Date: 12/13/21                                     Social Determinants of Health (SDOH) Interventions    Readmission Risk Interventions No flowsheet data found.

## 2021-12-13 NOTE — Progress Notes (Signed)
Pt left her walker at the hospital when she left. This RN called the patient to have her come back to get it, patient stated she did not want it.

## 2021-12-13 NOTE — Discharge Summary (Signed)
Patient ID: AIDEE LATIMORE MRN: [id number] DOB/AGE: Mar 16, 1951 71 y.o.  Admit date: 12/09/2021 Discharge date: 12/13/2021   Discharge Diagnoses:  Principal Problem:   Acute cholecystitis   Procedures: Robotic assisted laparoscopic partial cholecystectomy                       ERCP  Hospital Course: Patient with acute cholecystitis of 5 days of onset.  She underwent robotic assisted laparoscopic partial cholecystectomy due to severe inflammation in the colon's triangle and unable to get a critical view of safety.  All stones were removed.  Postoperative bile leak was suspected and ERCP was done for treatment.  ERCP successful.  Today the drain with minimal serous output.  Drain removed today.  Patient tolerating diet.  Patient ambulating.  Patient did PT and home health has been coordinated.  Incisions are dry and clean.  Physical Exam HENT:     Head: Normocephalic.  Cardiovascular:     Rate and Rhythm: Normal rate and regular rhythm.  Pulmonary:     Effort: Pulmonary effort is normal.  Abdominal:     General: Abdomen is flat. Bowel sounds are normal.     Palpations: Abdomen is soft.  Skin:    General: Skin is warm.     Capillary Refill: Capillary refill takes less than 2 seconds.  Neurological:     Mental Status: She is alert and oriented to person, place, and time.     Consults: Gastroenterology (Dr. Servando Snare)  Disposition: Discharge disposition: 01-Home or [facility]       Discharge Instructions     Diet - low sodium heart healthy   Complete by: As directed    Increase activity slowly   Complete by: As directed       Allergies as of 12/13/2021   No Known Allergies      Medication List     TAKE these medications    albuterol 108 (90 Base) MCG/ACT inhaler Commonly known as: VENTOLIN HFA Inhale 2 puffs into the lungs every 6 (six) hours as needed for wheezing or shortness of breath.   ARIPiprazole 20 MG tablet Commonly known as: Abilify Take 1 tablet (20 mg  total) by mouth daily.   ibuprofen 600 MG tablet Commonly known as: ADVIL Take 1 tablet (600 mg total) by mouth every 8 (eight) hours as needed.   METHADONE HCL PO Take 100 mg by mouth daily.   Premarin vaginal cream Generic drug: conjugated estrogens Place 0.25 Applicatorfuls vaginally daily.   umeclidinium-vilanterol 62.5-25 MCG/ACT Aepb Commonly known as: ANORO ELLIPTA Inhale 1 puff into the lungs daily at 6 (six) AM.               Durable Medical Equipment  (From admission, onward)           Start     Ordered   12/12/21 1601  For home use only DME Walker rolling  Once       Question Answer Comment  Walker: With 5 Inch Wheels   Patient needs a walker to treat with the following condition Weakness      12/12/21 1601            Follow-up Information     Carolan Shiver, MD Follow up in 2 week(s).   Specialty: General Surgery Why: Follow up after cholecystectomy Contact information: [address] [place] [address] [phone]

## 2021-12-13 NOTE — Discharge Instructions (Addendum)
°  Diet: Resume home heart healthy regular diet.   Activity: No heavy lifting >20 pounds (children, pets, laundry, garbage) or strenuous activity until follow-up, but light activity and walking are encouraged. Do not drive or drink alcohol if taking narcotic pain medications.  Wound care: May shower with soapy water and pat dry (do not rub incisions), but no baths or submerging incision underwater until follow-up. (no swimming)   Medications: Resume all home medications. For mild to moderate pain: acetaminophen (Tylenol) or ibuprofen (if no kidney disease). Combining Tylenol with alcohol can substantially increase your risk of causing liver disease. Continue using Methadone.   Call office [phone]) at any time if any questions, worsening pain, fevers/chills, bleeding, drainage from incision site, or other concerns.

## 2021-12-15 ENCOUNTER — Other Ambulatory Visit: Payer: Self-pay | Admitting: *Deleted

## 2021-12-15 DIAGNOSIS — N200 Calculus of kidney: Secondary | ICD-10-CM

## 2021-12-16 ENCOUNTER — Telehealth: Payer: Self-pay | Admitting: *Deleted

## 2021-12-16 NOTE — Telephone Encounter (Signed)
Transition Care Management Unsuccessful Follow-up Telephone Call ? ?Date of discharge and from where:  [facility] ? ?Attempts:  1st Attempt ? ?Reason for unsuccessful TCM follow-up call:  Left voice message ? ?  ?

## 2021-12-29 ENCOUNTER — Ambulatory Visit (INDEPENDENT_AMBULATORY_CARE_PROVIDER_SITE_OTHER): Payer: 59 | Admitting: Family Medicine

## 2021-12-29 ENCOUNTER — Other Ambulatory Visit: Payer: Self-pay

## 2021-12-29 ENCOUNTER — Ambulatory Visit: Payer: Self-pay | Admitting: *Deleted

## 2021-12-29 ENCOUNTER — Encounter: Payer: Self-pay | Admitting: Family Medicine

## 2021-12-29 VITALS — BP 133/73 | HR 90 | Temp 99.0°F | Wt 131.2 lb

## 2021-12-29 DIAGNOSIS — R0689 Other abnormalities of breathing: Secondary | ICD-10-CM | POA: Diagnosis not present

## 2021-12-29 DIAGNOSIS — R609 Edema, unspecified: Secondary | ICD-10-CM

## 2021-12-29 NOTE — Telephone Encounter (Signed)
Reason for Disposition  [1] Longstanding difficulty breathing (e.g., CHF, COPD, emphysema) AND [2] WORSE than normal  Answer Assessment - Initial Assessment Questions 1. ONSET: "When did the swelling start?" (e.g., minutes, hours, days)     1 week ago 2. LOCATION: "What part of the leg is swollen?"  "Are both legs swollen or just one leg?"     Bilateral swelling- R is worse 3. SEVERITY: "How bad is the swelling?" (e.g., localized; mild, moderate, severe)  - Localized - small area of swelling localized to one leg  - MILD pedal edema - swelling limited to foot and ankle, pitting edema < 1/4 inch (6 mm) deep, rest and elevation eliminate most or all swelling  - MODERATE edema - swelling of lower leg to knee, pitting edema > 1/4 inch (6 mm) deep, rest and elevation only partially reduce swelling  - SEVERE edema - swelling extends above knee, facial or hand swelling present      moderate 4. REDNESS: "Does the swelling look red or infected?"     no 5. PAIN: "Is the swelling painful to touch?" If Yes, ask: "How painful is it?"   (Scale 1-10; mild, moderate or severe)     no 6. FEVER: "Do you have a fever?" If Yes, ask: "What is it, how was it measured, and when did it start?"      no 7. CAUSE: "What do you think is causing the leg swelling?"     no 8. MEDICAL HISTORY: "Do you have a history of heart failure, kidney disease, liver failure, or cancer?"     no 9. RECURRENT SYMPTOM: "Have you had leg swelling before?" If Yes, ask: "When was the last time?" "What happened that time?"     no 10. OTHER SYMPTOMS: "Do you have any other symptoms?" (e.g., chest pain, difficulty breathing)       Changes in breathing 11. PREGNANCY: "Is there any chance you are pregnant?" "When was your last menstrual period?"       *No Answer*  Answer Assessment - Initial Assessment Questions 1. RESPIRATORY STATUS: "Describe your breathing?" (e.g., wheezing, shortness of breath, unable to speak, severe coughing)       SOB- with exertion 2. ONSET: "When did this breathing problem begin?"      1 week 3. PATTERN "Does the difficult breathing come and go, or has it been constant since it started?"      Comes and goes 4. SEVERITY: "How bad is your breathing?" (e.g., mild, moderate, severe)    - MILD: No SOB at rest, mild SOB with walking, speaks normally in sentences, can lie down, no retractions, pulse < 100.    - MODERATE: SOB at rest, SOB with minimal exertion and prefers to sit, cannot lie down flat, speaks in phrases, mild retractions, audible wheezing, pulse 100-120.    - SEVERE: Very SOB at rest, speaks in single words, struggling to breathe, sitting hunched forward, retractions, pulse > 120      mild 5. RECURRENT SYMPTOM: "Have you had difficulty breathing before?" If Yes, ask: "When was the last time?" and "What happened that time?"      Yes- COPD 6. CARDIAC HISTORY: "Do you have any history of heart disease?" (e.g., heart attack, angina, bypass surgery, angioplasty)      no 7. LUNG HISTORY: "Do you have any history of lung disease?"  (e.g., pulmonary embolus, asthma, emphysema)     COPD 8. CAUSE: "What do you think is causing the breathing problem?"  unsure 9. OTHER SYMPTOMS: "Do you have any other symptoms? (e.g., dizziness, runny nose, cough, chest pain, fever)     edema 10. O2 SATURATION MONITOR:  "Do you use an oxygen saturation monitor (pulse oximeter) at home?" If Yes, "What is your reading (oxygen level) today?" "What is your usual oxygen saturation reading?" (e.g., 95%)       Patient has not checked 11. PREGNANCY: "Is there any chance you are pregnant?" "When was your last menstrual period?"       na 12. TRAVEL: "Have you traveled out of the country in the last month?" (e.g., travel history, exposures)       no  Protocols used: Leg Swelling and Edema-A-AH, Breathing Difficulty-A-AH

## 2021-12-29 NOTE — Telephone Encounter (Signed)
°  Chief Complaint: breathing difficulty Symptoms: new onset bilateral edema, SOB- COPD- worse Frequency: 1 week Pertinent Negatives: Patient denies chest pain, dizziness,fever Disposition: [] ED /[] Urgent Care (no appt availability in office) / [x] Appointment(In office/virtual)/ []  [facility]/ [] Home Care/ [] Refused Recommended Disposition /[] [facility]/ []  Follow-up with PCP Additional Notes: Agent offered earlier appointment- patient preference to come this afternoon- advised call back if gets worse

## 2021-12-29 NOTE — Progress Notes (Signed)
BP 133/73    Pulse 90    Temp 99 F (37.2 C)    Wt 131 lb 3.2 oz (59.5 kg)    SpO2 96%    BMI 22.52 kg/m    Subjective:    Patient ID: Kim Graham, female    DOB: [birth year], 71 y.o.   MRN: [id number]  HPI: Kim Graham is a 71 y.o. female  Chief Complaint  Patient presents with   Edema    Patient states she has swelling in both legs from her knees down, right leg is worse for about a week.    Kim Graham presents today with swelling in her legs for about a week, getting worse. She notes that they feel like they are going to burst. They are tight and slightly red. Not hot. She has been having some SOB. No coughing. No wheezing. She had surgery 3 weeks ago and was in the hospital for a CCY. She notes that she is not feeling well. No fevers. No chills.    Relevant past medical, surgical, family and social history reviewed and updated as indicated. Interim medical history since our last visit reviewed. Allergies and medications reviewed and updated.  Review of Systems  Constitutional: Negative.   HENT: Negative.    Respiratory:  Positive for shortness of breath. Negative for apnea, cough, choking, chest tightness, wheezing and stridor.   Cardiovascular:  Positive for leg swelling. Negative for chest pain and palpitations.  Gastrointestinal: Negative.   Musculoskeletal: Negative.   Neurological: Negative.   Psychiatric/Behavioral: Negative.     Per HPI unless specifically indicated above     Objective:    BP 133/73    Pulse 90    Temp 99 F (37.2 C)    Wt 131 lb 3.2 oz (59.5 kg)    SpO2 96%    BMI 22.52 kg/m   Wt Readings from Last 3 Encounters:  12/29/21 131 lb 3.2 oz (59.5 kg)  12/09/21 135 lb (61.2 kg)  12/08/21 123 lb 6.4 oz (56 kg)    Physical Exam Vitals and nursing note reviewed.  Constitutional:      General: She is not in acute distress.    Appearance: Normal appearance. She is not ill-appearing, toxic-appearing or diaphoretic.  HENT:     Head: Normocephalic and  atraumatic.     Right Ear: External ear normal.     Left Ear: External ear normal.     Nose: Nose normal.     Mouth/Throat:     Mouth: Mucous membranes are moist.     Pharynx: Oropharynx is clear.  Eyes:     General: No scleral icterus.       Right eye: No discharge.        Left eye: No discharge.     Extraocular Movements: Extraocular movements intact.     Conjunctiva/sclera: Conjunctivae normal.     Pupils: Pupils are equal, round, and reactive to light.  Cardiovascular:     Rate and Rhythm: Normal rate and regular rhythm.     Pulses: Normal pulses.     Heart sounds: Normal heart sounds. No murmur heard.   No friction rub. No gallop.  Pulmonary:     Effort: Pulmonary effort is normal. No respiratory distress.     Breath sounds: Normal breath sounds. No stridor. No wheezing, rhonchi or rales.  Chest:     Chest wall: No tenderness.  Musculoskeletal:        General: Normal range of motion.  Cervical back: Normal range of motion and neck supple.     Right lower leg: Edema (3+ with redness, no heat) present.     Left lower leg: Edema (2+ mild redness, no heat) present.  Skin:    General: Skin is warm and dry.     Capillary Refill: Capillary refill takes less than 2 seconds.     Coloration: Skin is not jaundiced or pale.     Findings: No bruising, erythema, lesion or rash.  Neurological:     General: No focal deficit present.     Mental Status: She is alert and oriented to person, place, and time. Mental status is at baseline.  Psychiatric:        Mood and Affect: Mood normal.        Behavior: Behavior normal.        Thought Content: Thought content normal.        Judgment: Judgment normal.    Results for orders placed or performed during the hospital encounter of 12/09/21  Resp Panel by RT-PCR (Flu A&B, Covid) Nasopharyngeal Swab   Specimen: Nasopharyngeal Swab; Nasopharyngeal(NP) swabs in vial transport medium  Result Value Ref Range   SARS Coronavirus 2 by RT PCR  NEGATIVE NEGATIVE   Influenza A by PCR NEGATIVE NEGATIVE   Influenza B by PCR NEGATIVE NEGATIVE  CBC  Result Value Ref Range   WBC 13.0 (H) 4.0 - 10.5 K/uL   RBC 4.86 3.87 - 5.11 MIL/uL   Hemoglobin 15.2 (H) 12.0 - 15.0 g/dL   HCT 46.1 (H) 36.0 - 46.0 %   MCV 94.9 80.0 - 100.0 fL   MCH 31.3 26.0 - 34.0 pg   MCHC 33.0 30.0 - 36.0 g/dL   RDW 14.6 11.5 - 15.5 %   Platelets 203 150 - 400 K/uL   nRBC 0.0 0.0 - 0.2 %  Comprehensive metabolic panel  Result Value Ref Range   Sodium 136 135 - 145 mmol/L   Potassium 4.3 3.5 - 5.1 mmol/L   Chloride 101 98 - 111 mmol/L   CO2 24 22 - 32 mmol/L   Glucose, Bld 109 (H) 70 - 99 mg/dL   BUN 28 (H) 8 - 23 mg/dL   Creatinine, Ser 0.86 0.44 - 1.00 mg/dL   Calcium 9.1 8.9 - 10.3 mg/dL   Total Protein 7.0 6.5 - 8.1 g/dL   Albumin 3.7 3.5 - 5.0 g/dL   AST 24 15 - 41 U/L   ALT 16 0 - 44 U/L   Alkaline Phosphatase 73 38 - 126 U/L   Total Bilirubin 0.8 0.3 - 1.2 mg/dL   GFR, Estimated >60 >60 mL/min   Anion gap 11 5 - 15  Lipase, blood  Result Value Ref Range   Lipase 26 11 - 51 U/L  Urinalysis, Complete w Microscopic Urine, Random  Result Value Ref Range   Color, Urine YELLOW YELLOW   APPearance CLEAR CLEAR   Specific Gravity, Urine 1.020 1.005 - 1.030   pH 5.0 5.0 - 8.0   Glucose, UA NEGATIVE NEGATIVE mg/dL   Hgb urine dipstick MODERATE (A) NEGATIVE   Bilirubin Urine SMALL (A) NEGATIVE   Ketones, ur TRACE (A) NEGATIVE mg/dL   Protein, ur TRACE (A) NEGATIVE mg/dL   Nitrite NEGATIVE NEGATIVE   Leukocytes,Ua SMALL (A) NEGATIVE   Squamous Epithelial / LPF 21-50 0 - 5   WBC, UA 21-50 0 - 5 WBC/hpf   RBC / HPF >50 0 - 5 RBC/hpf   Bacteria, UA NONE  SEEN NONE SEEN   Mucus PRESENT   HIV Antibody (routine testing w rflx)  Result Value Ref Range   HIV Screen 4th Generation wRfx Non Reactive Non Reactive  Hemoglobin A1c  Result Value Ref Range   Hgb A1c MFr Bld 6.4 (H) 4.8 - 5.6 %   Mean Plasma Glucose 137 mg/dL  Glucose, capillary  Result  Value Ref Range   Glucose-Capillary 147 (H) 70 - 99 mg/dL  CBC  Result Value Ref Range   WBC 10.8 (H) 4.0 - 10.5 K/uL   RBC 4.42 3.87 - 5.11 MIL/uL   Hemoglobin 13.8 12.0 - 15.0 g/dL   HCT 43.2 36.0 - 46.0 %   MCV 97.7 80.0 - 100.0 fL   MCH 31.2 26.0 - 34.0 pg   MCHC 31.9 30.0 - 36.0 g/dL   RDW 14.5 11.5 - 15.5 %   Platelets 217 150 - 400 K/uL   nRBC 0.0 0.0 - 0.2 %  Comprehensive metabolic panel  Result Value Ref Range   Sodium 140 135 - 145 mmol/L   Potassium 4.8 3.5 - 5.1 mmol/L   Chloride 108 98 - 111 mmol/L   CO2 24 22 - 32 mmol/L   Glucose, Bld 104 (H) 70 - 99 mg/dL   BUN 14 8 - 23 mg/dL   Creatinine, Ser 0.67 0.44 - 1.00 mg/dL   Calcium 7.9 (L) 8.9 - 10.3 mg/dL   Total Protein 6.3 (L) 6.5 - 8.1 g/dL   Albumin 3.1 (L) 3.5 - 5.0 g/dL   AST 23 15 - 41 U/L   ALT 13 0 - 44 U/L   Alkaline Phosphatase 60 38 - 126 U/L   Total Bilirubin 0.6 0.3 - 1.2 mg/dL   GFR, Estimated >60 >60 mL/min   Anion gap 8 5 - 15  Comprehensive metabolic panel  Result Value Ref Range   Sodium 138 135 - 145 mmol/L   Potassium 3.9 3.5 - 5.1 mmol/L   Chloride 109 98 - 111 mmol/L   CO2 25 22 - 32 mmol/L   Glucose, Bld 99 70 - 99 mg/dL   BUN 11 8 - 23 mg/dL   Creatinine, Ser 0.66 0.44 - 1.00 mg/dL   Calcium 8.1 (L) 8.9 - 10.3 mg/dL   Total Protein 5.6 (L) 6.5 - 8.1 g/dL   Albumin 2.7 (L) 3.5 - 5.0 g/dL   AST 19 15 - 41 U/L   ALT 11 0 - 44 U/L   Alkaline Phosphatase 55 38 - 126 U/L   Total Bilirubin 0.8 0.3 - 1.2 mg/dL   GFR, Estimated >60 >60 mL/min   Anion gap 4 (L) 5 - 15  CBC  Result Value Ref Range   WBC 7.8 4.0 - 10.5 K/uL   RBC 3.87 3.87 - 5.11 MIL/uL   Hemoglobin 12.0 12.0 - 15.0 g/dL   HCT 37.8 36.0 - 46.0 %   MCV 97.7 80.0 - 100.0 fL   MCH 31.0 26.0 - 34.0 pg   MCHC 31.7 30.0 - 36.0 g/dL   RDW 14.4 11.5 - 15.5 %   Platelets 185 150 - 400 K/uL   nRBC 0.0 0.0 - 0.2 %  CBG monitoring, ED  Result Value Ref Range   Glucose-Capillary 149 (H) 70 - 99 mg/dL  Surgical pathology   Result Value Ref Range   SURGICAL PATHOLOGY      SURGICAL PATHOLOGY CASE: [id number] PATIENT: Shebra Hey Surgical Pathology Report     Specimen Submitted: A. Gallbladder  Clinical History: Acute cholecystitis,  right renal calculus    DIAGNOSIS: A. GALLBLADDER; CHOLECYSTECTOMY: - ACUTE CALCULOUS CHOLECYSTITIS WITH TRANSMURAL INFLAMMATION AND SEROSITIS. - NEGATIVE FOR DYSPLASIA AND MALIGNANCY.  GROSS DESCRIPTION: A. Labeled: Gallbladder Received: Fresh Collection time: 10:20 PM on 12/09/2021 Placed into formalin time: 2:13 AM on 12/10/2021 Size of specimen: 3.8 x 3.4 x 1.8 cm and 4.4 x 3.2 x 2.4 cm Specimen integrity: Highly disrupted External surface: The identifiable serosa is red-purple, smooth, and glistening with a roughened hepatic bed. Wall thickness: Ranges from 0.2-0.7 cm Mucosa: The mucosa is dark red and diffusely roughened with scattered areas of tan-yellow exudate. Cystic duct: A distinct portion of cystic duct is not grossly appreciated.  The specimen is reapproximated and the presumed c losest resection margin to the cystic duct is inked blue. Bile present: No distinct bile is grossly appreciated. Stones present: There are multiple yellow-dark green, firm, multifaceted stones, 3 x 2.8 x 2 cm in aggregate. Other findings: None grossly appreciated.  Block summary: 1 - 2 - closest resection margin to cystic duct, en face 3 - 4 - representative wall and roughened mucosa with overlying exudate  RB 12/10/2021  Final Diagnosis performed by Allena Napoleon, MD.   Electronically signed 12/11/2021 10:45:56AM The electronic signature indicates that the named Attending Pathologist has evaluated the specimen Technical component performed at [facility], [address] [place], [place] [address] Lab: [phone] Dir: Rush Farmer, MD, MMM  Professional component performed at [facility], [facility], [place], [place], [place] [address] Lab:  [phone] Dir: Kathi Simpers, MD       Assessment & Plan:   Problem List Items Addressed This Visit   None Visit Diagnoses     Peripheral edema    -  Primary   S/p Surgery 3 weeks ago. Will check [place] to r/o DVT. Await results. Treat as needed.    Relevant Orders   US Venous Img Lower Bilateral   DG Chest 2 View   Abnormal breath sounds       Rales on RLL. Will obtain CXR. May need lasix. Await results.   Relevant Orders   DG Chest 2 View        Follow up plan: Return Pending results.Marland Kitchen

## 2021-12-30 ENCOUNTER — Other Ambulatory Visit: Payer: Self-pay | Admitting: Family Medicine

## 2021-12-30 ENCOUNTER — Ambulatory Visit
Admission: RE | Admit: 2021-12-30 | Discharge: 2021-12-30 | Disposition: A | Discharge status: Home / Self Care | Payer: 59 | Source: Ambulatory Visit | Attending: Family Medicine | Admitting: Family Medicine

## 2021-12-30 DIAGNOSIS — R609 Edema, unspecified: Secondary | ICD-10-CM | POA: Diagnosis not present

## 2021-12-30 MED ORDER — HYDROCHLOROTHIAZIDE 25 MG PO TABS: 25.0000 mg | ORAL_TABLET | Freq: Every day | ORAL | 3 refills | Status: DC

## 2022-01-01 ENCOUNTER — Encounter: Payer: Self-pay | Admitting: Urology

## 2022-01-01 ENCOUNTER — Ambulatory Visit: Payer: 59 | Admitting: Urology

## 2022-01-08 ENCOUNTER — Other Ambulatory Visit: Payer: Self-pay

## 2022-01-08 ENCOUNTER — Encounter: Payer: Self-pay | Admitting: Family Medicine

## 2022-01-08 ENCOUNTER — Ambulatory Visit (INDEPENDENT_AMBULATORY_CARE_PROVIDER_SITE_OTHER): Payer: 59 | Admitting: Family Medicine

## 2022-01-08 VITALS — BP 105/65 | HR 116 | Temp 98.9°F | Ht 64.0 in | Wt 123.6 lb

## 2022-01-08 DIAGNOSIS — R609 Edema, unspecified: Secondary | ICD-10-CM | POA: Diagnosis not present

## 2022-01-08 MED ORDER — HYDROCHLOROTHIAZIDE 25 MG PO TABS
12.5000 mg | ORAL_TABLET | Freq: Every day | ORAL | 1 refills | Status: DC
Start: 2022-01-08 — End: 2022-02-13

## 2022-01-08 NOTE — Progress Notes (Signed)
BP 105/65    Pulse (!) 116    Temp 98.9 F (37.2 C) (Oral)    Ht 5\' 4"  (1.626 m)    Wt 123 lb 9.6 oz (56.1 kg)    SpO2 96%    BMI 21.22 kg/m    Subjective:    Patient ID: Michaela Corner, female    DOB: [birth year], 71 y.o.   MRN: [id number]  HPI: Cleola Brandley Held is a 71 y.o. female  Chief Complaint  Patient presents with   Edema    Patient is here for a follow up on Edema in her legs. Patient states her legs have gotten a lot better since she was last seen as she has not noticed any swelling.    HYPERTENSION Hypertension status: controlled  Satisfied with current treatment? yes Duration of hypertension: chronic BP monitoring frequency:  not checking BP medication side effects:  no Medication compliance: excellent compliance Previous BP meds: HCTZ Aspirin: no Recurrent headaches: no Visual changes: no Palpitations: no Dyspnea: no Chest pain: no Lower extremity edema: no Dizzy/lightheaded: no  Relevant past medical, surgical, family and social history reviewed and updated as indicated. Interim medical history since our last visit reviewed. Allergies and medications reviewed and updated.  Review of Systems  Constitutional: Negative.   Respiratory: Negative.    Cardiovascular: Negative.   Gastrointestinal: Negative.   Musculoskeletal: Negative.   Psychiatric/Behavioral: Negative.     Per HPI unless specifically indicated above     Objective:    BP 105/65    Pulse (!) 116    Temp 98.9 F (37.2 C) (Oral)    Ht 5\' 4"  (1.626 m)    Wt 123 lb 9.6 oz (56.1 kg)    SpO2 96%    BMI 21.22 kg/m   Wt Readings from Last 3 Encounters:  01/08/22 123 lb 9.6 oz (56.1 kg)  12/29/21 131 lb 3.2 oz (59.5 kg)  12/09/21 135 lb (61.2 kg)    Physical Exam Vitals and nursing note reviewed.  Constitutional:      General: She is not in acute distress.    Appearance: Normal appearance. She is not ill-appearing, toxic-appearing or diaphoretic.  HENT:     Head: Normocephalic and atraumatic.      Right Ear: External ear normal.     Left Ear: External ear normal.     Nose: Nose normal.     Mouth/Throat:     Mouth: Mucous membranes are moist.     Pharynx: Oropharynx is clear.  Eyes:     General: No scleral icterus.       Right eye: No discharge.        Left eye: No discharge.     Extraocular Movements: Extraocular movements intact.     Conjunctiva/sclera: Conjunctivae normal.     Pupils: Pupils are equal, round, and reactive to light.  Cardiovascular:     Rate and Rhythm: Normal rate and regular rhythm.     Pulses: Normal pulses.     Heart sounds: Normal heart sounds. No murmur heard.   No friction rub. No gallop.  Pulmonary:     Effort: Pulmonary effort is normal. No respiratory distress.     Breath sounds: Normal breath sounds. No stridor. No wheezing, rhonchi or rales.  Chest:     Chest wall: No tenderness.  Musculoskeletal:        General: Normal range of motion.     Cervical back: Normal range of motion and neck supple.  Right lower leg: No edema.     Left lower leg: No edema.  Skin:    General: Skin is warm and dry.     Capillary Refill: Capillary refill takes less than 2 seconds.     Coloration: Skin is not jaundiced or pale.     Findings: No bruising, erythema, lesion or rash.  Neurological:     General: No focal deficit present.     Mental Status: She is alert and oriented to person, place, and time. Mental status is at baseline.  Psychiatric:        Mood and Affect: Mood normal.        Behavior: Behavior normal.        Thought Content: Thought content normal.        Judgment: Judgment normal.    Results for orders placed or performed during the hospital encounter of 12/09/21  Resp Panel by RT-PCR (Flu A&B, Covid) Nasopharyngeal Swab   Specimen: Nasopharyngeal Swab; Nasopharyngeal(NP) swabs in vial transport medium  Result Value Ref Range   SARS Coronavirus 2 by RT PCR NEGATIVE NEGATIVE   Influenza A by PCR NEGATIVE NEGATIVE   Influenza B by PCR  NEGATIVE NEGATIVE  CBC  Result Value Ref Range   WBC 13.0 (H) 4.0 - 10.5 K/uL   RBC 4.86 3.87 - 5.11 MIL/uL   Hemoglobin 15.2 (H) 12.0 - 15.0 g/dL   HCT 46.1 (H) 36.0 - 46.0 %   MCV 94.9 80.0 - 100.0 fL   MCH 31.3 26.0 - 34.0 pg   MCHC 33.0 30.0 - 36.0 g/dL   RDW 14.6 11.5 - 15.5 %   Platelets 203 150 - 400 K/uL   nRBC 0.0 0.0 - 0.2 %  Comprehensive metabolic panel  Result Value Ref Range   Sodium 136 135 - 145 mmol/L   Potassium 4.3 3.5 - 5.1 mmol/L   Chloride 101 98 - 111 mmol/L   CO2 24 22 - 32 mmol/L   Glucose, Bld 109 (H) 70 - 99 mg/dL   BUN 28 (H) 8 - 23 mg/dL   Creatinine, Ser 0.86 0.44 - 1.00 mg/dL   Calcium 9.1 8.9 - 10.3 mg/dL   Total Protein 7.0 6.5 - 8.1 g/dL   Albumin 3.7 3.5 - 5.0 g/dL   AST 24 15 - 41 U/L   ALT 16 0 - 44 U/L   Alkaline Phosphatase 73 38 - 126 U/L   Total Bilirubin 0.8 0.3 - 1.2 mg/dL   GFR, Estimated >60 >60 mL/min   Anion gap 11 5 - 15  Lipase, blood  Result Value Ref Range   Lipase 26 11 - 51 U/L  Urinalysis, Complete w Microscopic Urine, Random  Result Value Ref Range   Color, Urine YELLOW YELLOW   APPearance CLEAR CLEAR   Specific Gravity, Urine 1.020 1.005 - 1.030   pH 5.0 5.0 - 8.0   Glucose, UA NEGATIVE NEGATIVE mg/dL   Hgb urine dipstick MODERATE (A) NEGATIVE   Bilirubin Urine SMALL (A) NEGATIVE   Ketones, ur TRACE (A) NEGATIVE mg/dL   Protein, ur TRACE (A) NEGATIVE mg/dL   Nitrite NEGATIVE NEGATIVE   Leukocytes,Ua SMALL (A) NEGATIVE   Squamous Epithelial / LPF 21-50 0 - 5   WBC, UA 21-50 0 - 5 WBC/hpf   RBC / HPF >50 0 - 5 RBC/hpf   Bacteria, UA NONE SEEN NONE SEEN   Mucus PRESENT   HIV Antibody (routine testing w rflx)  Result Value Ref Range   HIV  Screen 4th Generation wRfx Non Reactive Non Reactive  Hemoglobin A1c  Result Value Ref Range   Hgb A1c MFr Bld 6.4 (H) 4.8 - 5.6 %   Mean Plasma Glucose 137 mg/dL  Glucose, capillary  Result Value Ref Range   Glucose-Capillary 147 (H) 70 - 99 mg/dL  CBC  Result Value  Ref Range   WBC 10.8 (H) 4.0 - 10.5 K/uL   RBC 4.42 3.87 - 5.11 MIL/uL   Hemoglobin 13.8 12.0 - 15.0 g/dL   HCT 43.2 36.0 - 46.0 %   MCV 97.7 80.0 - 100.0 fL   MCH 31.2 26.0 - 34.0 pg   MCHC 31.9 30.0 - 36.0 g/dL   RDW 14.5 11.5 - 15.5 %   Platelets 217 150 - 400 K/uL   nRBC 0.0 0.0 - 0.2 %  Comprehensive metabolic panel  Result Value Ref Range   Sodium 140 135 - 145 mmol/L   Potassium 4.8 3.5 - 5.1 mmol/L   Chloride 108 98 - 111 mmol/L   CO2 24 22 - 32 mmol/L   Glucose, Bld 104 (H) 70 - 99 mg/dL   BUN 14 8 - 23 mg/dL   Creatinine, Ser 0.67 0.44 - 1.00 mg/dL   Calcium 7.9 (L) 8.9 - 10.3 mg/dL   Total Protein 6.3 (L) 6.5 - 8.1 g/dL   Albumin 3.1 (L) 3.5 - 5.0 g/dL   AST 23 15 - 41 U/L   ALT 13 0 - 44 U/L   Alkaline Phosphatase 60 38 - 126 U/L   Total Bilirubin 0.6 0.3 - 1.2 mg/dL   GFR, Estimated >60 >60 mL/min   Anion gap 8 5 - 15  Comprehensive metabolic panel  Result Value Ref Range   Sodium 138 135 - 145 mmol/L   Potassium 3.9 3.5 - 5.1 mmol/L   Chloride 109 98 - 111 mmol/L   CO2 25 22 - 32 mmol/L   Glucose, Bld 99 70 - 99 mg/dL   BUN 11 8 - 23 mg/dL   Creatinine, Ser 0.66 0.44 - 1.00 mg/dL   Calcium 8.1 (L) 8.9 - 10.3 mg/dL   Total Protein 5.6 (L) 6.5 - 8.1 g/dL   Albumin 2.7 (L) 3.5 - 5.0 g/dL   AST 19 15 - 41 U/L   ALT 11 0 - 44 U/L   Alkaline Phosphatase 55 38 - 126 U/L   Total Bilirubin 0.8 0.3 - 1.2 mg/dL   GFR, Estimated >60 >60 mL/min   Anion gap 4 (L) 5 - 15  CBC  Result Value Ref Range   WBC 7.8 4.0 - 10.5 K/uL   RBC 3.87 3.87 - 5.11 MIL/uL   Hemoglobin 12.0 12.0 - 15.0 g/dL   HCT 37.8 36.0 - 46.0 %   MCV 97.7 80.0 - 100.0 fL   MCH 31.0 26.0 - 34.0 pg   MCHC 31.7 30.0 - 36.0 g/dL   RDW 14.4 11.5 - 15.5 %   Platelets 185 150 - 400 K/uL   nRBC 0.0 0.0 - 0.2 %  CBG monitoring, ED  Result Value Ref Range   Glucose-Capillary 149 (H) 70 - 99 mg/dL  Surgical pathology  Result Value Ref Range   SURGICAL PATHOLOGY      SURGICAL PATHOLOGY CASE:  [id number] PATIENT: Shannie Lamountain Surgical Pathology Report     Specimen Submitted: A. Gallbladder  Clinical History: Acute cholecystitis, right renal calculus    DIAGNOSIS: A. GALLBLADDER; CHOLECYSTECTOMY: - ACUTE CALCULOUS CHOLECYSTITIS WITH TRANSMURAL INFLAMMATION AND SEROSITIS. - NEGATIVE FOR DYSPLASIA  AND MALIGNANCY.  GROSS DESCRIPTION: A. Labeled: Gallbladder Received: Fresh Collection time: 10:20 PM on 12/09/2021 Placed into formalin time: 2:13 AM on 12/10/2021 Size of specimen: 3.8 x 3.4 x 1.8 cm and 4.4 x 3.2 x 2.4 cm Specimen integrity: Highly disrupted External surface: The identifiable serosa is red-purple, smooth, and glistening with a roughened hepatic bed. Wall thickness: Ranges from 0.2-0.7 cm Mucosa: The mucosa is dark red and diffusely roughened with scattered areas of tan-yellow exudate. Cystic duct: A distinct portion of cystic duct is not grossly appreciated.  The specimen is reapproximated and the presumed c losest resection margin to the cystic duct is inked blue. Bile present: No distinct bile is grossly appreciated. Stones present: There are multiple yellow-dark green, firm, multifaceted stones, 3 x 2.8 x 2 cm in aggregate. Other findings: None grossly appreciated.  Block summary: 1 - 2 - closest resection margin to cystic duct, en face 3 - 4 - representative wall and roughened mucosa with overlying exudate  RB 12/10/2021  Final Diagnosis performed by Allena Napoleon, MD.   Electronically signed 12/11/2021 10:45:56AM The electronic signature indicates that the named Attending Pathologist has evaluated the specimen Technical component performed at [facility], [address], [place], [place] [address] Lab: [phone] Dir: Rush Farmer, MD, MMM  Professional component performed at [facility], [facility], [place], [place],  [address] Lab: [phone] Dir: Kathi Simpers, MD       Assessment & Plan:   Problem List  Items Addressed This Visit   None Visit Diagnoses     Peripheral edema    -  Primary   Resolved. BP running low. Will cut HCTZ in 1/2. Continue to monitor. Recheck June at 6 month follow up.   Relevant Orders   Basic metabolic panel        Follow up plan: Return June for 6 month follow up.

## 2022-01-09 ENCOUNTER — Encounter: Payer: Self-pay | Admitting: Family Medicine

## 2022-01-09 LAB — BASIC METABOLIC PANEL
BUN/Creatinine Ratio: 12 (ref 12–28)
BUN: 12 mg/dL (ref 8–27)
CO2: 24 mmol/L (ref 20–29)
Calcium: 9.4 mg/dL (ref 8.7–10.3)
Chloride: 102 mmol/L (ref 96–106)
Creatinine, Ser: 0.97 mg/dL (ref 0.57–1.00)
Glucose: 123 mg/dL — ABNORMAL HIGH (ref 70–99)
Potassium: 3.6 mmol/L (ref 3.5–5.2)
Sodium: 142 mmol/L (ref 134–144)
eGFR: 63 mL/min/{1.73_m2} (ref >59)

## 2022-02-04 ENCOUNTER — Other Ambulatory Visit: Payer: Self-pay

## 2022-02-04 ENCOUNTER — Ambulatory Visit (INDEPENDENT_AMBULATORY_CARE_PROVIDER_SITE_OTHER): Payer: 59 | Admitting: Gastroenterology

## 2022-02-04 ENCOUNTER — Encounter: Payer: Self-pay | Admitting: Gastroenterology

## 2022-02-04 VITALS — BP 129/76 | HR 103 | Temp 97.6°F | Wt 120.0 lb

## 2022-02-04 DIAGNOSIS — Z4689 Encounter for fitting and adjustment of other specified devices: Secondary | ICD-10-CM

## 2022-02-04 DIAGNOSIS — B182 Chronic viral hepatitis C: Secondary | ICD-10-CM | POA: Diagnosis not present

## 2022-02-04 NOTE — Progress Notes (Signed)
? ? ?Primary Care Physician: Dorcas Carrow, DO ? ?Primary Gastroenterologist:  Dr. [name] ? ?Chief Complaint  ?Patient presents with  ? Follow-up  ? ? ?HPI: Kim Graham is a 71 y.o. female here with a history of having hepatitis C and seeing me back in December 2021.  The patient has not followed up since then but was recently in the hospital with a bile duct leak after having a cholecystectomy.  The patient had an ERCP with stent placement by me. ?The patient reports that she is doing well without any issues at the present time.  There is no report of any abdominal discomfort nausea vomiting fevers or chills.  The patient is following up now because she was told on discharge follow-up with me. ? ?Past Medical History:  ?Diagnosis Date  ? COPD (chronic obstructive pulmonary disease) (HCC)   ? Diabetes mellitus type 2, diet-controlled (HCC) 04/15/2018  ? ? ?Current Outpatient Medications  ?Medication Sig Dispense Refill  ? albuterol (VENTOLIN HFA) 108 (90 Base) MCG/ACT inhaler Inhale 2 puffs into the lungs every 6 (six) hours as needed for wheezing or shortness of breath. 8.5 g 2  ? ARIPiprazole (ABILIFY) 20 MG tablet Take 1 tablet (20 mg total) by mouth daily. 90 tablet 1  ? ascorbic acid (VITAMIN C) 500 MG tablet Take by mouth.    ? b complex vitamins capsule Take 1 capsule by mouth daily.    ? conjugated estrogens (PREMARIN) vaginal cream Place 0.25 Applicatorfuls vaginally daily. 42.5 g 11  ? hydrochlorothiazide (HYDRODIURIL) 25 MG tablet Take 0.5 tablets (12.5 mg total) by mouth daily. 45 tablet 1  ? METHADONE HCL PO Take 100 mg by mouth daily.    ? Multiple Vitamin (MULTI-VITAMIN) tablet Take 1 tablet by mouth daily.    ? umeclidinium-vilanterol (ANORO ELLIPTA) 62.5-25 MCG/ACT AEPB Inhale 1 puff into the lungs daily at 6 (six) AM. 1 each 12  ? zinc gluconate 50 MG tablet Take by mouth.    ? ?No current facility-administered medications for this visit.  ? ? ?Allergies as of 02/04/2022  ? (No Known  Allergies)  ? ? ?ROS: ? ?General: Negative for anorexia, weight loss, fever, chills, fatigue, weakness. ?ENT: Negative for hoarseness, difficulty swallowing , nasal congestion. ?CV: Negative for chest pain, angina, palpitations, dyspnea on exertion, peripheral edema.  ?Respiratory: Negative for dyspnea at rest, dyspnea on exertion, cough, sputum, wheezing.  ?GI: See history of present illness. ?GU:  Negative for dysuria, hematuria, urinary incontinence, urinary frequency, nocturnal urination.  ?Endo: Negative for unusual weight change.  ?  ?Physical Examination: ? ? BP 129/76   Pulse (!) 103   Temp 97.6 ?F (36.4 ?C) (Oral)   Wt 120 lb (54.4 kg)   BMI 20.60 kg/m?  ? ?General: Well-nourished, well-developed in no acute distress.  ?Eyes: No icterus. Conjunctivae pink. ?Neuro: Alert and oriented x 3.  Grossly intact. ?Skin: Warm and dry, no jaundice.   ?Psych: Alert and cooperative, normal mood and affect. ? ?Labs:  ?  ?Imaging Studies: ?No results found. ? ?Assessment and Plan:  ? ?Kim Graham is a 71 y.o. y/o female who comes today after having an ERCP with a stent placement for bile duct leak.  The patient had a finding of bridging fibrosis on her blood work prior to being treated for hepatitis C.  The patient has been treated and has been doing well and reports that she is no longer drinking.  The patient will have her fibrosis score  rechecked and she will be set up for repeat ERCP in 1 month's time to remove the stent.  The patient has been explained the plan and agrees with it. ? ? ? ? ?[name], MD. [name] ? ? ? Note: This dictation was prepared with Dragon dictation along with smaller phrase technology. Any transcriptional errors that result from this process are unintentional.  ?

## 2022-02-04 NOTE — Addendum Note (Signed)
Addended by: Roena Malady on: 02/04/2022 12:15 PM ? ? Modules accepted: Orders ? ?

## 2022-02-06 LAB — NASH FIBROSURE
ALPHA 2-MACROGLOBULINS, QN: 376 mg/dL — ABNORMAL HIGH (ref 110–276)
ALT (SGPT) P5P: 21 IU/L (ref 0–40)
AST (SGOT) P5P: 36 IU/L (ref 0–40)
Apolipoprotein A-1: 117 mg/dL (ref 116–209)
Bilirubin, Total: 0.2 mg/dL (ref 0.0–1.2)
Cholesterol, Total: 109 mg/dL (ref 100–199)
Fibrosis Score: 0.36 — ABNORMAL HIGH (ref 0.00–0.21)
GGT: 36 IU/L (ref 0–60)
Glucose: 98 mg/dL (ref 70–99)
Haptoglobin: 337 mg/dL (ref 37–355)
Height: 60 in
NASH Score: 0.25
Steatosis Score: 0.28 (ref 0.00–0.30)
Triglycerides: 100 mg/dL (ref 0–149)
Weight: 100 [lb_av]

## 2022-02-07 ENCOUNTER — Encounter: Payer: Self-pay | Admitting: Emergency Medicine

## 2022-02-07 ENCOUNTER — Other Ambulatory Visit: Payer: Self-pay

## 2022-02-07 ENCOUNTER — Inpatient Hospital Stay
Admission: EM | Admit: 2022-02-07 | Discharge: 2022-02-13 | DRG: 853 | Disposition: A | Discharge status: Skilled Nursing Facility | Payer: 59 | Attending: Internal Medicine | Admitting: Internal Medicine

## 2022-02-07 ENCOUNTER — Emergency Department: Payer: 59

## 2022-02-07 DIAGNOSIS — R6520 Severe sepsis without septic shock: Secondary | ICD-10-CM | POA: Diagnosis not present

## 2022-02-07 DIAGNOSIS — R7401 Elevation of levels of liver transaminase levels: Secondary | ICD-10-CM | POA: Diagnosis not present

## 2022-02-07 DIAGNOSIS — J189 Pneumonia, unspecified organism: Secondary | ICD-10-CM | POA: Diagnosis not present

## 2022-02-07 DIAGNOSIS — Z20822 Contact with and (suspected) exposure to covid-19: Secondary | ICD-10-CM | POA: Diagnosis present

## 2022-02-07 DIAGNOSIS — G8929 Other chronic pain: Secondary | ICD-10-CM | POA: Diagnosis present

## 2022-02-07 DIAGNOSIS — Z79890 Hormone replacement therapy (postmenopausal): Secondary | ICD-10-CM | POA: Diagnosis not present

## 2022-02-07 DIAGNOSIS — E860 Dehydration: Secondary | ICD-10-CM

## 2022-02-07 DIAGNOSIS — Z87442 Personal history of urinary calculi: Secondary | ICD-10-CM

## 2022-02-07 DIAGNOSIS — Z833 Family history of diabetes mellitus: Secondary | ICD-10-CM

## 2022-02-07 DIAGNOSIS — N3000 Acute cystitis without hematuria: Secondary | ICD-10-CM

## 2022-02-07 DIAGNOSIS — E876 Hypokalemia: Secondary | ICD-10-CM | POA: Diagnosis present

## 2022-02-07 DIAGNOSIS — Z720 Tobacco use: Secondary | ICD-10-CM | POA: Diagnosis present

## 2022-02-07 DIAGNOSIS — N179 Acute kidney failure, unspecified: Secondary | ICD-10-CM | POA: Diagnosis present

## 2022-02-07 DIAGNOSIS — F17210 Nicotine dependence, cigarettes, uncomplicated: Secondary | ICD-10-CM | POA: Diagnosis present

## 2022-02-07 DIAGNOSIS — Z9049 Acquired absence of other specified parts of digestive tract: Secondary | ICD-10-CM | POA: Diagnosis not present

## 2022-02-07 DIAGNOSIS — Z79899 Other long term (current) drug therapy: Secondary | ICD-10-CM

## 2022-02-07 DIAGNOSIS — N200 Calculus of kidney: Secondary | ICD-10-CM

## 2022-02-07 DIAGNOSIS — J449 Chronic obstructive pulmonary disease, unspecified: Secondary | ICD-10-CM | POA: Diagnosis present

## 2022-02-07 DIAGNOSIS — N201 Calculus of ureter: Secondary | ICD-10-CM

## 2022-02-07 DIAGNOSIS — N1330 Unspecified hydronephrosis: Secondary | ICD-10-CM | POA: Diagnosis not present

## 2022-02-07 DIAGNOSIS — J440 Chronic obstructive pulmonary disease with acute lower respiratory infection: Secondary | ICD-10-CM | POA: Diagnosis not present

## 2022-02-07 DIAGNOSIS — F1120 Opioid dependence, uncomplicated: Secondary | ICD-10-CM | POA: Diagnosis present

## 2022-02-07 DIAGNOSIS — G9341 Metabolic encephalopathy: Secondary | ICD-10-CM | POA: Diagnosis present

## 2022-02-07 DIAGNOSIS — Z716 Tobacco abuse counseling: Secondary | ICD-10-CM

## 2022-02-07 DIAGNOSIS — N390 Urinary tract infection, site not specified: Secondary | ICD-10-CM | POA: Diagnosis not present

## 2022-02-07 DIAGNOSIS — E119 Type 2 diabetes mellitus without complications: Secondary | ICD-10-CM | POA: Diagnosis present

## 2022-02-07 DIAGNOSIS — A429 Actinomycosis, unspecified: Secondary | ICD-10-CM | POA: Diagnosis not present

## 2022-02-07 DIAGNOSIS — R7881 Bacteremia: Secondary | ICD-10-CM | POA: Diagnosis not present

## 2022-02-07 DIAGNOSIS — Z960 Presence of urogenital implants: Secondary | ICD-10-CM | POA: Diagnosis present

## 2022-02-07 DIAGNOSIS — A427 Actinomycotic sepsis: Secondary | ICD-10-CM | POA: Diagnosis present

## 2022-02-07 DIAGNOSIS — N136 Pyonephrosis: Secondary | ICD-10-CM | POA: Diagnosis present

## 2022-02-07 DIAGNOSIS — J441 Chronic obstructive pulmonary disease with (acute) exacerbation: Secondary | ICD-10-CM | POA: Diagnosis not present

## 2022-02-07 DIAGNOSIS — F339 Major depressive disorder, recurrent, unspecified: Secondary | ICD-10-CM | POA: Diagnosis present

## 2022-02-07 DIAGNOSIS — A419 Sepsis, unspecified organism: Secondary | ICD-10-CM | POA: Diagnosis not present

## 2022-02-07 LAB — URINALYSIS, COMPLETE (UACMP) WITH MICROSCOPIC
Bilirubin Urine: NEGATIVE
Glucose, UA: NEGATIVE mg/dL
Ketones, ur: NEGATIVE mg/dL
Nitrite: NEGATIVE
Protein, ur: 100 mg/dL — AB
RBC / HPF: >50 RBC/hpf — ABNORMAL HIGH (ref 0–5)
Specific Gravity, Urine: 1.018 (ref 1.005–1.030)
WBC, UA: >50 WBC/hpf — ABNORMAL HIGH (ref 0–5)
pH: 6 (ref 5.0–8.0)

## 2022-02-07 LAB — CBC WITH DIFFERENTIAL/PLATELET
Abs Immature Granulocytes: 0.03 10*3/uL (ref 0.00–0.07)
Basophils Absolute: 0 10*3/uL (ref 0.0–0.1)
Basophils Relative: 0 %
Eosinophils Absolute: 0 10*3/uL (ref 0.0–0.5)
Eosinophils Relative: 0 %
HCT: 40 % (ref 36.0–46.0)
Hemoglobin: 12.4 g/dL (ref 12.0–15.0)
Immature Granulocytes: 0 %
Lymphocytes Relative: 5 %
Lymphs Abs: 0.6 10*3/uL — ABNORMAL LOW (ref 0.7–4.0)
MCH: 28.1 pg (ref 26.0–34.0)
MCHC: 31 g/dL (ref 30.0–36.0)
MCV: 90.5 fL (ref 80.0–100.0)
Monocytes Absolute: 1.2 10*3/uL — ABNORMAL HIGH (ref 0.1–1.0)
Monocytes Relative: 11 %
Neutro Abs: 9.6 10*3/uL — ABNORMAL HIGH (ref 1.7–7.7)
Neutrophils Relative %: 84 %
Platelets: 168 10*3/uL (ref 150–400)
RBC: 4.42 MIL/uL (ref 3.87–5.11)
RDW: 14.4 % (ref 11.5–15.5)
WBC: 11.4 10*3/uL — ABNORMAL HIGH (ref 4.0–10.5)
nRBC: 0 % (ref 0.0–0.2)

## 2022-02-07 LAB — COMPREHENSIVE METABOLIC PANEL
ALT: 59 U/L — ABNORMAL HIGH (ref 0–44)
AST: 139 U/L — ABNORMAL HIGH (ref 15–41)
Albumin: 2.9 g/dL — ABNORMAL LOW (ref 3.5–5.0)
Alkaline Phosphatase: 73 U/L (ref 38–126)
Anion gap: 10 (ref 5–15)
BUN: 38 mg/dL — ABNORMAL HIGH (ref 8–23)
CO2: 25 mmol/L (ref 22–32)
Calcium: 8.8 mg/dL — ABNORMAL LOW (ref 8.9–10.3)
Chloride: 106 mmol/L (ref 98–111)
Creatinine, Ser: 1.26 mg/dL — ABNORMAL HIGH (ref 0.44–1.00)
GFR, Estimated: 46 mL/min — ABNORMAL LOW (ref >60)
Glucose, Bld: 152 mg/dL — ABNORMAL HIGH (ref 70–99)
Potassium: 3.7 mmol/L (ref 3.5–5.1)
Sodium: 141 mmol/L (ref 135–145)
Total Bilirubin: 0.6 mg/dL (ref 0.3–1.2)
Total Protein: 7.1 g/dL (ref 6.5–8.1)

## 2022-02-07 LAB — PROTIME-INR
INR: 1.2 (ref 0.8–1.2)
Prothrombin Time: 15.1 seconds (ref 11.4–15.2)

## 2022-02-07 LAB — RESP PANEL BY RT-PCR (FLU A&B, COVID) ARPGX2
Influenza A by PCR: NEGATIVE
Influenza B by PCR: NEGATIVE
SARS Coronavirus 2 by RT PCR: NEGATIVE

## 2022-02-07 LAB — GLUCOSE, CAPILLARY
Glucose-Capillary: 111 mg/dL — ABNORMAL HIGH (ref 70–99)
Glucose-Capillary: 111 mg/dL — ABNORMAL HIGH (ref 70–99)

## 2022-02-07 LAB — TROPONIN I (HIGH SENSITIVITY)
Troponin I (High Sensitivity): 23 ng/L — ABNORMAL HIGH (ref <18)
Troponin I (High Sensitivity): 20 ng/L — ABNORMAL HIGH (ref <18)

## 2022-02-07 LAB — LIPASE, BLOOD: Lipase: 44 U/L (ref 11–51)

## 2022-02-07 LAB — CBG MONITORING, ED
Glucose-Capillary: 163 mg/dL — ABNORMAL HIGH (ref 70–99)
Glucose-Capillary: 106 mg/dL — ABNORMAL HIGH (ref 70–99)
Glucose-Capillary: 107 mg/dL — ABNORMAL HIGH (ref 70–99)

## 2022-02-07 LAB — TSH: TSH: 0.852 u[IU]/mL (ref 0.350–4.500)

## 2022-02-07 LAB — AMMONIA: Ammonia: 25 umol/L (ref 9–35)

## 2022-02-07 LAB — LACTIC ACID, PLASMA: Lactic Acid, Venous: 1.4 mmol/L (ref 0.5–1.9)

## 2022-02-07 MED ORDER — ONDANSETRON HCL 4 MG/2ML IJ SOLN: 4.0000 mg | Freq: Four times a day (QID) | INTRAMUSCULAR | Status: DC | PRN

## 2022-02-07 MED ORDER — ALBUTEROL SULFATE (2.5 MG/3ML) 0.083% IN NEBU: 3.0000 mL | INHALATION_SOLUTION | Freq: Four times a day (QID) | RESPIRATORY_TRACT | Status: DC | PRN

## 2022-02-07 MED ORDER — UMECLIDINIUM-VILANTEROL 62.5-25 MCG/ACT IN AEPB
1.0000 | INHALATION_SPRAY | Freq: Every day | RESPIRATORY_TRACT | Status: DC
  Administered 2022-02-08 – 2022-02-13 (×6): 1 via RESPIRATORY_TRACT
  Filled 2022-02-07: qty 14

## 2022-02-07 MED ORDER — ACETAMINOPHEN 650 MG RE SUPP
650.0000 mg | Freq: Four times a day (QID) | RECTAL | Status: DC | PRN
  Administered 2022-02-09 – 2022-02-10 (×2): 650 mg via RECTAL
  Filled 2022-02-07 (×2): qty 1

## 2022-02-07 MED ORDER — ACETAMINOPHEN 325 MG PO TABS
650.0000 mg | ORAL_TABLET | Freq: Four times a day (QID) | ORAL | Status: DC | PRN
  Administered 2022-02-07 – 2022-02-10 (×4): 650 mg via ORAL
  Filled 2022-02-07 (×4): qty 2

## 2022-02-07 MED ORDER — ONDANSETRON HCL 4 MG PO TABS: 4.0000 mg | ORAL_TABLET | Freq: Four times a day (QID) | ORAL | Status: DC | PRN

## 2022-02-07 MED ORDER — SODIUM CHLORIDE 0.9 % IV SOLN: INTRAVENOUS | Status: AC

## 2022-02-07 MED ORDER — SODIUM CHLORIDE 0.9 % IV SOLN
1.0000 g | INTRAVENOUS | Status: DC
  Administered 2022-02-08 – 2022-02-09 (×2): 1 g via INTRAVENOUS
  Filled 2022-02-07: qty 10
  Filled 2022-02-07: qty 1

## 2022-02-07 MED ORDER — ARIPIPRAZOLE 10 MG PO TABS
20.0000 mg | ORAL_TABLET | Freq: Every day | ORAL | Status: DC
  Administered 2022-02-07 – 2022-02-13 (×6): 20 mg via ORAL
  Filled 2022-02-07 (×2): qty 10
  Filled 2022-02-07 (×5): qty 2

## 2022-02-07 MED ORDER — CEFTRIAXONE SODIUM 1 G IJ SOLR
1.0000 g | Freq: Once | INTRAMUSCULAR | Status: AC
  Administered 2022-02-07: 1 g via INTRAVENOUS
  Filled 2022-02-07: qty 10

## 2022-02-07 MED ORDER — SODIUM CHLORIDE 0.9 % IV BOLUS
500.0000 mL | Freq: Once | INTRAVENOUS | Status: AC
  Administered 2022-02-07: 500 mL via INTRAVENOUS

## 2022-02-07 MED ORDER — LACTATED RINGERS IV BOLUS
1000.0000 mL | Freq: Once | INTRAVENOUS | Status: AC
  Administered 2022-02-07: 1000 mL via INTRAVENOUS

## 2022-02-07 MED ORDER — ADULT MULTIVITAMIN W/MINERALS CH
1.0000 | ORAL_TABLET | Freq: Every day | ORAL | Status: DC
  Administered 2022-02-08 – 2022-02-13 (×5): 1 via ORAL
  Filled 2022-02-07 (×5): qty 1

## 2022-02-07 MED ORDER — ASCORBIC ACID 500 MG PO TABS
500.0000 mg | ORAL_TABLET | Freq: Every day | ORAL | Status: DC
  Administered 2022-02-07 – 2022-02-13 (×6): 500 mg via ORAL
  Filled 2022-02-07 (×6): qty 1

## 2022-02-07 MED ORDER — ENOXAPARIN SODIUM 40 MG/0.4ML IJ SOSY
40.0000 mg | PREFILLED_SYRINGE | INTRAMUSCULAR | Status: DC
  Administered 2022-02-07 – 2022-02-09 (×3): 40 mg via SUBCUTANEOUS
  Filled 2022-02-07 (×3): qty 0.4

## 2022-02-07 MED ORDER — METHADONE HCL 10 MG/ML PO CONC: 100.0000 mg | Freq: Every day | ORAL | Status: DC

## 2022-02-07 NOTE — Assessment & Plan Note (Addendum)
-   LFTs elevated on admission ?- Recently had cholecystectomy on 12/23/2021 after having developed acute cholecystitis ?- also had ERCP with CBD stent placement prior to [facility] ?- AST/ALT downtrending but this morning AP and TB are increased; differential would include obstruction vs cholestasis due to infection vs other causes ?- will ask for GI input in case needs MRCP or further workup ?

## 2022-02-07 NOTE — Plan of Care (Signed)

## 2022-02-07 NOTE — Assessment & Plan Note (Signed)
Stable and not acutely exacerbated Continue as needed bronchodilator therapy as well as inhaled steroids 

## 2022-02-07 NOTE — ED Notes (Signed)
Admission MD at bedside.  

## 2022-02-07 NOTE — Assessment & Plan Note (Addendum)
Smoking cessation was discussed with patient in detail. ?-Nicotine patch PRN ?

## 2022-02-07 NOTE — ED Provider Notes (Signed)
? ?[facility] ?Provider Note ? ? ? Event Date/Time  ? First MD Initiated Contact with Patient 02/07/22 [phone]   ?  (approximate) ? ? ?History  ? ?Weakness and Altered Mental Status ? ?EM caveat, confusion ? ?HPI ? ?Kim Graham is a 71 y.o. female who on review of clinic records including recent record from March 29 has a history of hepatitis C cholecystectomy COPD and diabetes ? ?She has a history of ERCP with stent placement due to previous bile duct leak ?  ?History is provided largely by the patient's neighbor.  Neighbor reports that the patient herself does not have any known family, she reports she does not have any children and that she believes that she is her closest person.  Does not have a known healthcare power of attorney (per her neighbor who states she is her closest friend for 13 years) ? ?Patient had an appointment with GI about 3 days ago, and shortly after that she started to seem confused and weak, not able to get up out of the bed for a couple days time, very low on energy.  She is also developed confusion and off-and-on slurring of speech for the last few days as well ? ?Patient denies pain.  Oriented to [place] and her birthday but not to place or situation.  Friend reports that she was exhibiting strange activities like she is kind of grabbing or picking at things in the air and as they were driving to the hospital she was doing things such as playing with the door handle in the car ? ?Physical Exam  ? ?Triage Vital Signs: ?ED Triage Vitals  ?Enc Vitals Group  ?   BP --   ?   Pulse --   ?   Resp --   ?   Temp --   ?   Temp src --   ?   SpO2 --   ?   Weight 02/07/22 0723 105 lb (47.6 kg)  ?   Height 02/07/22 0723 5\' 4"  (1.626 m)  ?   Head Circumference --   ?   Peak Flow --   ?   Pain Score 02/07/22 0725 0  ?   Pain Loc --   ?   Pain Edu? --   ?   Excl. in [place]? --   ? ? ?Most recent vital signs: ?Vitals:  ? 02/07/22 0830 02/07/22 0900  ?BP: 124/64 (!) 99/53  ?Pulse: 84 64   ?Resp: 12 13  ?Temp:    ?SpO2: 94% 92%  ? ? ? ?General: Awake, no distress.  She is oriented to [place] only.  She is in no distress laying comfortably but appears fatigued ?CV:  Good peripheral perfusion.  Normal heart tones normal rate ?Resp:  Normal effort.  Clear lungs bilaterally.  Normal work of breathing no distress ?Abd:  No distention.  Soft nontender nondistended through all regions.  No pain or wincing with examination of any quadrant ?Other:  Mucous membranes are dry.  No peripheral edema.  Appears volume depleted by exam ?She moves all extremities to command, but has difficulty following more than simple commands.  There is no obvious deficit in any extremity.  She smile symmetrically.  Extraocular movements are normal ? ?ED Results / Procedures / Treatments  ? ?Labs ?(all labs ordered are listed, but only abnormal results are displayed) ?Labs Reviewed  ?COMPREHENSIVE METABOLIC PANEL - Abnormal; Notable for the following components:  ?    Result  Value  ? Glucose, Bld 152 (*)   ? BUN 38 (*)   ? Creatinine, Ser 1.26 (*)   ? Calcium 8.8 (*)   ? Albumin 2.9 (*)   ? AST 139 (*)   ? ALT 59 (*)   ? GFR, Estimated 46 (*)   ? All other components within normal limits  ?URINALYSIS, COMPLETE (UACMP) WITH MICROSCOPIC - Abnormal; Notable for the following components:  ? Color, Urine YELLOW (*)   ? APPearance CLOUDY (*)   ? Hgb urine dipstick LARGE (*)   ? Protein, ur 100 (*)   ? Leukocytes,Ua LARGE (*)   ? RBC / HPF >50 (*)   ? WBC, UA >50 (*)   ? Bacteria, UA MANY (*)   ? All other components within normal limits  ?CBC WITH DIFFERENTIAL/PLATELET - Abnormal; Notable for the following components:  ? WBC 11.4 (*)   ? Neutro Abs 9.6 (*)   ? Lymphs Abs 0.6 (*)   ? Monocytes Absolute 1.2 (*)   ? All other components within normal limits  ?CBG MONITORING, ED - Abnormal; Notable for the following components:  ? Glucose-Capillary 163 (*)   ? All other components within normal limits  ?CBG MONITORING, ED - Abnormal; Notable  for the following components:  ? Glucose-Capillary 106 (*)   ? All other components within normal limits  ?TROPONIN I (HIGH SENSITIVITY) - Abnormal; Notable for the following components:  ? Troponin I (High Sensitivity) 23 (*)   ? All other components within normal limits  ?RESP PANEL BY RT-PCR (FLU A&B, COVID) [id number]  ?CULTURE, BLOOD (ROUTINE X 2)  ?CULTURE, BLOOD (ROUTINE X 2)  ?URINE CULTURE  ?AMMONIA  ?TSH  ?LIPASE, BLOOD  ?PROTIME-INR  ?LACTIC ACID, PLASMA  ?CBC WITH DIFFERENTIAL/PLATELET  ?CBG MONITORING, ED  ?CBG MONITORING, ED  ?TROPONIN I (HIGH SENSITIVITY)  ? ? ? ?EKG ? ?Reviewed interpreted at 730 ?Heart rate 95 ?QRS 100 ?QTc 460 ?Normal sinus rhythm, nonspecific T wave abnormality noted in multiple leads, but mild ST segment depression is noted in lateral precordial region as well is possibly some slight inferiorly ? ? ?RADIOLOGY ? ?Personally reviewed and interpreted the patient's chest x-ray, negative for acute finding ? ? ? ? ? ?PROCEDURES: ? ?Critical Care performed: No ? ?Procedures ? ? ?MEDICATIONS ORDERED IN ED: ?Medications  ?lactated ringers bolus 1,000 mL (has no administration in time range)  ?sodium chloride 0.9 % bolus 500 mL (0 mLs Intravenous Stopped 02/07/22 0909)  ?cefTRIAXone (ROCEPHIN) 1 g in sodium chloride 0.9 % 100 mL IVPB (0 g Intravenous Stopped 02/07/22 0940)  ? ? ? ?IMPRESSION / MDM / ASSESSMENT AND PLAN / ED COURSE  ?I reviewed the triage vital signs and the nursing notes. ?             ?               ? ?Differential diagnosis includes, but is not limited to, broad differential of etiologies including possible metabolic etiologies, infectious etiologies neurologic cardiac pulmonary etc.  She does not have chest pain no hypoxia no tachycardia.  She does have potentially a low-grade temperature of 99.5 though not technically febrile.  I am concerned about multiple sources of her altered mental status.  There is no focal neurologic deficits on exam, we will proceed with CT imaging of  the head to further evaluate for possible abnormalities grossly as well. ? ?The patient is on the cardiac monitor to evaluate for evidence of arrhythmia and/or  significant heart rate changes. ? ?Clinical Course as of 02/07/22 0941  ?Sat Feb 07, 2022  ?0828 CT head, normal [MQ]  ?0846 Labs reviewed, CBC notable for mild leukocytosis.  Remaining labs pending at this time [MQ]  ?  ?Clinical Course User Index ?[MQ] Delman Kitten, MD  ? ?----------------------------------------- ?9:18 AM on 02/07/2022 ?----------------------------------------- ?Rocephin has been ordered urinalysis reviewed concerning for acute urinary tract infection.  Patient also appears dry somewhat dehydrated, additional fluids ordered now that chemistry reveals normal sodium.  Very mild transaminitis.  Nonspecific, known previous cholecystectomy and no associated abdominal pain on examination as denoted. ? ?At this time, patient appears appropriate for admission to the hospital for further care and management due to altered mental status in the setting of apparent urinary tract infection which I suspect is likely the driving force but also noted a mild transaminitis.  Further care management and treatment anticipate under the hospitalist service.  Admission discussed with hospitalist Dr. Francine Graven. ? ? ?FINAL CLINICAL IMPRESSION(S) / ED DIAGNOSES  ? ?Final diagnoses:  ?Urinary tract infection, acute  ? ? ? ?Rx / DC Orders  ? ?ED Discharge Orders   ? ? None  ? ?  ? ? ? ?Note:  This document was prepared using Dragon voice recognition software and may include unintentional dictation errors. ?  Delman Kitten, MD ?02/07/22 [phone] ? ?

## 2022-02-07 NOTE — Assessment & Plan Note (Addendum)
-   Continue SSI and CBG monitoring ?

## 2022-02-07 NOTE — H&P (Signed)
?History and Physical  ? ? ?Patient: Kim Graham [id number] DOB: Oct 14, 1951 ?DOA: 02/07/2022 ?DOS: the patient was seen and examined on 02/07/2022 ?PCP: Valerie Roys, DO  ?Patient coming from: Home ? ?Chief Complaint:  ?Chief Complaint  ?Patient presents with  ? Weakness  ? Altered Mental Status  ? ?HPI: LAITYN Graham is a 71 y.o. female with medical history significant for nicotine dependence, COPD, type 2 diabetes mellitus well controlled, history of hepatitis C status posttreatment who presents to the ER via private vehicle for evaluation of feeling unwell. ?She was brought into the ER by her friend who noted that she been confused for about 2 days and very weak and unable to get around like she usually would. ?Patient states that she feels "bad".  She complains of abdominal pain mostly around the periumbilical area which she rates about a 5 x 10 in intensity at its worst.  Pain is nonradiating and she denies having any nausea or vomiting. ?She has had frequency of urination but denies having any dysuria or hematuria.   ?Per her friend who is also her neighbor she has been confused for about 2 days and has been very weak and unable to get out of bed.  She states that her speech has also been slowed occasionally. ?She denies having any fever or chills, no chest pain, no shortness of breath, no dizziness, no lightheadedness, no palpitations, no diaphoresis, no leg swelling, no focal deficits or blurred vision. ?During my evaluation she appears to be oriented to person place and time ?Review of Systems: As mentioned in the history of present illness. All other systems reviewed and are negative. ?Past Medical History:  ?Diagnosis Date  ? COPD (chronic obstructive pulmonary disease) ([place])   ? Diabetes mellitus type 2, diet-controlled ([place]) 04/15/2018  ? ?Past Surgical History:  ?Procedure Laterality Date  ? CYSTOSCOPY W/ URETERAL STENT PLACEMENT Right 12/09/2021  ? Procedure: CYSTOSCOPY WITH RETROGRADE  PYELOGRAM/URETERAL STENT PLACEMENT;  Surgeon: Abbie Sons, MD;  Location: [facility] ORS;  Service: Urology;  Laterality: Right;  ? ENDOSCOPIC RETROGRADE CHOLANGIOPANCREATOGRAPHY (ERCP) WITH PROPOFOL N/A 12/11/2021  ? Procedure: ENDOSCOPIC RETROGRADE CHOLANGIOPANCREATOGRAPHY (ERCP) WITH PROPOFOL;  Surgeon: Lucilla Lame, MD;  Location: [facility] ENDOSCOPY;  Service: Endoscopy;  Laterality: N/A;  ? ?Social History:  reports that she has been smoking cigarettes. She has a 24.50 pack-year smoking history. She has never used smokeless tobacco. She reports that she does not currently use alcohol. She reports that she does not use drugs. ? ?No Known Allergies ? ?Family History  ?Problem Relation Age of Onset  ? Diabetes Mother   ? Cancer Father   ?     Pancreatic  ? Diabetes Maternal Aunt   ? Diabetes Maternal Grandmother   ? Dementia Maternal Grandfather   ? Heart disease Maternal Grandfather   ? ? ?Prior to Admission medications   ?Medication Sig Start Date End Date Taking? Authorizing Provider  ?albuterol (VENTOLIN HFA) 108 (90 Base) MCG/ACT inhaler Inhale 2 puffs into the lungs every 6 (six) hours as needed for wheezing or shortness of breath. 10/14/21   Valerie Roys, DO  ?ARIPiprazole (ABILIFY) 20 MG tablet Take 1 tablet (20 mg total) by mouth daily. 10/14/21   Park Liter P, DO  ?ascorbic acid (VITAMIN C) 500 MG tablet Take by mouth.    [provider]  ?b complex vitamins capsule Take 1 capsule by mouth daily.    [provider]  ?conjugated estrogens (PREMARIN) vaginal cream Place 0.25  Applicatorfuls vaginally daily. 04/04/21   Park Liter P, DO  ?hydrochlorothiazide (HYDRODIURIL) 25 MG tablet Take 0.5 tablets (12.5 mg total) by mouth daily. 01/08/22   Valerie Roys, DO  ?METHADONE HCL PO Take 100 mg by mouth daily.    [provider]  ?Multiple Vitamin (MULTI-VITAMIN) tablet Take 1 tablet by mouth daily.    [provider]  ?umeclidinium-vilanterol (ANORO ELLIPTA) 62.5-25  MCG/ACT AEPB Inhale 1 puff into the lungs daily at 6 (six) AM. 10/14/21   Park Liter P, DO  ?zinc gluconate 50 MG tablet Take by mouth.    [provider]  ? ? ?Physical Exam: ?Vitals:  ? 02/07/22 0830 02/07/22 0900 02/07/22 0930 02/07/22 1015  ?BP: 124/64 (!) 99/53 (!) 101/54   ?Pulse: 84 64 64 74  ?Resp: 12 13 13 13   ?Temp:      ?TempSrc:      ?SpO2: 94% 92% 94% 98%  ?Weight:      ?Height:      ? ?Physical Exam ?Vitals and nursing note reviewed.  ?Constitutional:   ?   Appearance: Normal appearance.  ?   Comments: Sleeping but arouses easily  ?HENT:  ?   Head: Normocephalic and atraumatic.  ?   Nose: Nose normal.  ?   Mouth/Throat:  ?   Mouth: Mucous membranes are dry.  ?Eyes:  ?   Conjunctiva/sclera: Conjunctivae normal.  ?Cardiovascular:  ?   Rate and Rhythm: Normal rate and regular rhythm.  ?Pulmonary:  ?   Effort: Pulmonary effort is normal.  ?   Breath sounds: Normal breath sounds.  ?Abdominal:  ?   General: Abdomen is flat. Bowel sounds are normal.  ?   Palpations: Abdomen is soft.  ?   Tenderness: There is abdominal tenderness.  ?   Comments: Periumbilical tenderness  ?Musculoskeletal:     ?   General: Normal range of motion.  ?   Cervical back: Normal range of motion and neck supple.  ?Skin: ?   General: Skin is warm and dry.  ?Neurological:  ?   General: No focal deficit present.  ?   Motor: Weakness present.  ?Psychiatric:     ?   Mood and Affect: Mood normal.     ?   Behavior: Behavior normal.  ? ? ?Data Reviewed: ?Relevant notes from primary care and specialist visits, past discharge summaries as available in EHR, including Care Everywhere. ?Prior diagnostic testing as pertinent to current admission diagnoses ?Updated medications and problem lists for reconciliation ?ED course, including vitals, labs, imaging, treatment and response to treatment ?Triage notes, nursing and pharmacy notes and ED provider's notes ?Notable results as noted in HPI ?Labs reviewed.  Glucose 152, BUN 38, creatinine  1.26, calcium 8.8, albumin 2.9, AST 139, ALT 59, total bilirubin 0.6, white count 11.4, hemoglobin 12.4, hematocrit 40, MCV 90.5, ammonia level 25 ?Urine analysis shows pyuria ?Chest x-ray reviewed by me shows hyperexpansion without acute cardiopulmonary findings. ?CT scan of the head without contrast shows no acute findings ?Twelve-lead EKG reviewed by me shows sinus rhythm.  Biatrial enlargement ?There are no new results to review at this time. ? ?Assessment and Plan: ?* UTI (urinary tract infection) ?Patient presents for evaluation of mental status changes and weakness and is found to have significant pyuria. ?Chart reviewed shows a previous urine culture yielded E. coli that is pan sensitive ?We will place patient empirically on Rocephin 1 g IV daily until urine culture results are available ? ?Acute metabolic encephalopathy ?Most  likely secondary to UTI ?Expect improvement in patient's mental status following resolution of acute infection ? ?Transaminitis ?Patient noted to have significant transaminitis ?She denies recent alcohol use and had normal liver enzymes about 10 days ago. ?She is status postcholecystectomy and has a biliary stent in place but bilirubin levels are within normal limits ?We will hydrate patient and repeat liver enzymes in a.m. ? ?Dehydration ?Secondary to poor oral intake ?Patient has a baseline serum creatinine of 0.97 and today on admission it is 1.26. ?She has dry mucous membranes and poor skin turgor ?We will hydrate patient ?Repeat renal parameters in a.m. ? ?Diabetes mellitus type 2, diet-controlled ([place]) ?Check blood sugars AC meals ?Maintain consistent carbohydrate diet ? ?Depression, recurrent ([place]) ?Stable ?Continue Abilify ? ?Tobacco abuse ?Smoking cessation was discussed with patient in detail. ?She declines a nicotine transdermal patch at this time ? ?Chronic obstructive pulmonary disease (HCC) ?Stable and not acutely exacerbated ?Continue as needed bronchodilator therapy as  well as inhaled steroids ? ? ? ? ? Advance Care Planning:   Code Status: Full Code  ? ?Consults: None ? ?Family Communication: Greater than 50% of time was spent discussing patient's condition and plan of care with her

## 2022-02-07 NOTE — Assessment & Plan Note (Addendum)
Continue Abilify °

## 2022-02-07 NOTE — Assessment & Plan Note (Addendum)
-   Patient presented with AMS and possibly increased urinary frequency.  UA notable for negative nitrite, large LE, greater than 50 WBC, many bacteria.  Suspicion is for UTI contributing to her encephalopathy however also cannot rule out contribution of methadone given her advancing age and continued high-dose dosing ?-Follow-up urine culture; still in process ?- Continue Rocephin ?- Mentation remains improved and she is alert and oriented. ?

## 2022-02-07 NOTE — Assessment & Plan Note (Addendum)
-   Concern for mixed etiology in setting of UTI and ongoing chronic opioid use ?- Fortunately, mentation remains improved ?- Continue treatment for UTI ?- Resume methadone but monitor for worsening lethargy or decreased mentation ?- does have some perceived weakness; will ask for PT to eval in anticipation for discharge home maybe tomorrow or Wed ?

## 2022-02-07 NOTE — ED Triage Notes (Addendum)
Pt via POV from home. Pt not able to give much history. Pt states she has been feeling bad, unknown how long. Pt is alert but oriented only to [place]. Unknown pt's baseline. Denies any pain. Per family, pt has been more confused on Thursday and slurred speech and weakness.   ?

## 2022-02-07 NOTE — Assessment & Plan Note (Addendum)
-   baseline creatinine ~ 0.6 - 0.9 ?- patient presents with increase in creat >0.3 mg/dL above baseline, creat increase >1.5x baseline presumed to have occurred within past 7 days PTA ?-Creatinine 1.26 on admission ?- Presumed prerenal from poor intake and volume depletion ?- Continue fluids and encouraging good intake ? ?

## 2022-02-07 NOTE — ED Notes (Signed)
Pt transported to CT ?

## 2022-02-08 ENCOUNTER — Inpatient Hospital Stay: Payer: 59

## 2022-02-08 DIAGNOSIS — N179 Acute kidney failure, unspecified: Secondary | ICD-10-CM | POA: Diagnosis not present

## 2022-02-08 DIAGNOSIS — G9341 Metabolic encephalopathy: Secondary | ICD-10-CM | POA: Diagnosis not present

## 2022-02-08 DIAGNOSIS — N3000 Acute cystitis without hematuria: Secondary | ICD-10-CM | POA: Diagnosis not present

## 2022-02-08 LAB — COMPREHENSIVE METABOLIC PANEL
ALT: 285 U/L — ABNORMAL HIGH (ref 0–44)
ALT: 275 U/L — ABNORMAL HIGH (ref 0–44)
AST: 673 U/L — ABNORMAL HIGH (ref 15–41)
AST: 559 U/L — ABNORMAL HIGH (ref 15–41)
Albumin: 2.8 g/dL — ABNORMAL LOW (ref 3.5–5.0)
Albumin: 2.4 g/dL — ABNORMAL LOW (ref 3.5–5.0)
Alkaline Phosphatase: 115 U/L (ref 38–126)
Alkaline Phosphatase: 125 U/L (ref 38–126)
Anion gap: 9 (ref 5–15)
Anion gap: 9 (ref 5–15)
BUN: 32 mg/dL — ABNORMAL HIGH (ref 8–23)
BUN: 29 mg/dL — ABNORMAL HIGH (ref 8–23)
CO2: 24 mmol/L (ref 22–32)
CO2: 23 mmol/L (ref 22–32)
Calcium: 8.7 mg/dL — ABNORMAL LOW (ref 8.9–10.3)
Calcium: 8.5 mg/dL — ABNORMAL LOW (ref 8.9–10.3)
Chloride: 110 mmol/L (ref 98–111)
Chloride: 109 mmol/L (ref 98–111)
Creatinine, Ser: 1.31 mg/dL — ABNORMAL HIGH (ref 0.44–1.00)
Creatinine, Ser: 1.19 mg/dL — ABNORMAL HIGH (ref 0.44–1.00)
GFR, Estimated: 44 mL/min — ABNORMAL LOW (ref >60)
GFR, Estimated: 49 mL/min — ABNORMAL LOW (ref >60)
Glucose, Bld: 108 mg/dL — ABNORMAL HIGH (ref 70–99)
Glucose, Bld: 82 mg/dL (ref 70–99)
Potassium: 3.8 mmol/L (ref 3.5–5.1)
Potassium: 3.6 mmol/L (ref 3.5–5.1)
Sodium: 143 mmol/L (ref 135–145)
Sodium: 141 mmol/L (ref 135–145)
Total Bilirubin: 0.8 mg/dL (ref 0.3–1.2)
Total Bilirubin: 0.8 mg/dL (ref 0.3–1.2)
Total Protein: 6.6 g/dL (ref 6.5–8.1)
Total Protein: 6.2 g/dL — ABNORMAL LOW (ref 6.5–8.1)

## 2022-02-08 LAB — CBC
HCT: 40.1 % (ref 36.0–46.0)
Hemoglobin: 12.5 g/dL (ref 12.0–15.0)
MCH: 28.7 pg (ref 26.0–34.0)
MCHC: 31.2 g/dL (ref 30.0–36.0)
MCV: 92.2 fL (ref 80.0–100.0)
Platelets: 141 10*3/uL — ABNORMAL LOW (ref 150–400)
RBC: 4.35 MIL/uL (ref 3.87–5.11)
RDW: 14.6 % (ref 11.5–15.5)
WBC: 9.5 10*3/uL (ref 4.0–10.5)
nRBC: 0 % (ref 0.0–0.2)

## 2022-02-08 LAB — GLUCOSE, CAPILLARY
Glucose-Capillary: 99 mg/dL (ref 70–99)
Glucose-Capillary: 107 mg/dL — ABNORMAL HIGH (ref 70–99)
Glucose-Capillary: 63 mg/dL — ABNORMAL LOW (ref 70–99)
Glucose-Capillary: 89 mg/dL (ref 70–99)

## 2022-02-08 MED ORDER — SODIUM CHLORIDE 0.9 % IV SOLN: INTRAVENOUS | Status: DC

## 2022-02-08 MED ORDER — FLUCONAZOLE 100 MG PO TABS
100.0000 mg | ORAL_TABLET | Freq: Every day | ORAL | Status: DC
Start: 2022-02-08 — End: 2022-02-12
  Administered 2022-02-08 – 2022-02-12 (×3): 100 mg via ORAL
  Filled 2022-02-08 (×5): qty 1

## 2022-02-08 MED ORDER — METHADONE HCL 10 MG/ML PO CONC
100.0000 mg | Freq: Every day | ORAL | Status: DC
  Administered 2022-02-08 – 2022-02-09 (×2): 100 mg via ORAL
  Filled 2022-02-08 (×2): qty 10

## 2022-02-08 NOTE — Progress Notes (Addendum)
?Progress Note ? ? ? ?Kim MaudlinMartha L Graham   ?ZOX:[id number]  ?DOB: November 29, 1950  ?DOA: 02/07/2022     1 ?PCP: Dorcas CarrowJohnson, Megan P, DO ? ?Initial CC: AMS ? ?Hospital Course: ?[name] is a 71 yo female with PMH chronic pain with methadone dependence, COPD, DM II, history of HCV s/p treatment who presented to the hospital with confusion.  She had endorsed some urinary symptoms on admission including increased frequency but denied dysuria or hematuria.  Due to some worsening weakness and increased confusion she had presented to the hospital. ?Urinalysis was suggestive of UTI and she was started on Rocephin. ? ?Interval History:  ?Evaluated in her room this morning.  Still having some lower abdominal pain pointing to over her bladder area.  Denies radiation of pain nor any nausea, vomiting.  She was more awake and alert compared to admission.  She was asking back for her methadone this morning. ? ?Assessment and Plan: ?* UTI (urinary tract infection) ?- Patient presented with AMS and possibly increased urinary frequency.  UA notable for negative nitrite, large LE, greater than 50 WBC, many bacteria.  Suspicion is for UTI contributing to her encephalopathy however also cannot rule out contribution of methadone given her advancing age and continued high-dose dosing ?-Follow-up urine culture ?- Continue Rocephin ?- Mentation seems to have improved some this morning.  Therefore, will resume methadone ? ?AKI (acute kidney injury) (HCC) ?- baseline creatinine ~ 0.6 - 0.9 ?- patient presents with increase in creat >0.3 mg/dL above baseline, creat increase >1.5x baseline presumed to have occurred within past 7 days PTA ?-Creatinine 1.26 on admission ?- Presumed prerenal from poor intake and volume depletion ?- Continue fluids and encouraging good intake ? ? ?Acute metabolic encephalopathy ?- Concern for mixed etiology in setting of UTI and ongoing chronic opioid use ?- Fortunately, mentation is improved this morning ?- Continue treatment for  UTI ?- Resume methadone but monitor for worsening lethargy or decreased mentation ? ?Transaminitis ?- LFTs uptrending since admission ?- Recently had cholecystectomy on 12/23/2021 after having developed acute cholecystitis ?-Repeat CMP this afternoon, if still uptrending LFTs, will consult GI.  Postop bile leak was noted after cholecystectomy ? ?Methadone dependence (HCC) ?- Verified with pharmacy assistance ?- Patient on methadone 100 mg daily.  Last dose was 02/07/2022 prior to admission ?- Methadone resumed in hospital ? ?Diabetes mellitus type 2, diet-controlled (HCC) ?- Continue SSI and CBG monitoring ? ?Depression, recurrent (HCC) ?- Continue Abilify ? ?Tobacco abuse ?Smoking cessation was discussed with patient in detail. ?-Nicotine patch PRN ? ?Chronic obstructive pulmonary disease (HCC) ?Stable and not acutely exacerbated ?Continue as needed bronchodilator therapy as well as inhaled steroids ? ? ?Old records reviewed in assessment of this patient ? ?Antimicrobials: ?Rocephin 02/07/2022 >> current ? ?DVT prophylaxis:  ?enoxaparin (LOVENOX) injection 40 mg Start: 02/07/22 2200 ? ? ?Code Status:   Code Status: Full Code ? ?Disposition Plan:  Home in 1-2 days ?Status is: Inpt ? ?Objective: ?Blood pressure 102/65, pulse (!) 58, temperature 100.1 ?F (37.8 ?C), resp. rate 18, height 5\' 4"  (1.626 m), weight 47.6 kg, SpO2 96 %.  ?Examination:  ?Physical Exam ?Constitutional:   ?   General: She is not in acute distress. ?   Appearance: Normal appearance. She is not toxic-appearing.  ?HENT:  ?   Head: Normocephalic and atraumatic.  ?   Mouth/Throat:  ?   Mouth: Mucous membranes are moist.  ?Eyes:  ?   Extraocular Movements: Extraocular movements intact.  ?Cardiovascular:  ?  Rate and Rhythm: Normal rate and regular rhythm.  ?Pulmonary:  ?   Effort: Pulmonary effort is normal.  ?   Breath sounds: Normal breath sounds.  ?Abdominal:  ?   General: Bowel sounds are normal.  ?   Palpations: Abdomen is soft.  ?   Comments: Mild  very lower abdominal pain over bladder region with no rebound or guarding.  Bowel sounds present  ?Musculoskeletal:     ?   General: Normal range of motion.  ?   Cervical back: Normal range of motion and neck supple.  ?Skin: ?   General: Skin is warm and dry.  ?Neurological:  ?   General: No focal deficit present.  ?   Mental Status: She is alert.  ?Psychiatric:     ?   Mood and Affect: Mood normal.     ?   Behavior: Behavior normal.  ?  ? ?Consultants:  ? ? ?Procedures:  ? ? ?Data Reviewed: ?Results for orders placed or performed during the hospital encounter of 02/07/22 (from the past 24 hour(s))  ?POC CBG, ED     Status: Abnormal  ? Collection Time: 02/07/22 11:48 AM  ?Result Value Ref Range  ? Glucose-Capillary 107 (H) 70 - 99 mg/dL  ?Glucose, capillary     Status: Abnormal  ? Collection Time: 02/07/22  6:31 PM  ?Result Value Ref Range  ? Glucose-Capillary 111 (H) 70 - 99 mg/dL  ?Glucose, capillary     Status: Abnormal  ? Collection Time: 02/07/22  9:29 PM  ?Result Value Ref Range  ? Glucose-Capillary 111 (H) 70 - 99 mg/dL  ? Comment 1 Notify RN   ?CBC     Status: Abnormal  ? Collection Time: 02/08/22  5:45 AM  ?Result Value Ref Range  ? WBC 9.5 4.0 - 10.5 K/uL  ? RBC 4.35 3.87 - 5.11 MIL/uL  ? Hemoglobin 12.5 12.0 - 15.0 g/dL  ? HCT 40.1 36.0 - 46.0 %  ? MCV 92.2 80.0 - 100.0 fL  ? MCH 28.7 26.0 - 34.0 pg  ? MCHC 31.2 30.0 - 36.0 g/dL  ? RDW 14.6 11.5 - 15.5 %  ? Platelets 141 (L) 150 - 400 K/uL  ? nRBC 0.0 0.0 - 0.2 %  ?Comprehensive metabolic panel     Status: Abnormal  ? Collection Time: 02/08/22  5:45 AM  ?Result Value Ref Range  ? Sodium 143 135 - 145 mmol/L  ? Potassium 3.8 3.5 - 5.1 mmol/L  ? Chloride 110 98 - 111 mmol/L  ? CO2 24 22 - 32 mmol/L  ? Glucose, Bld 108 (H) 70 - 99 mg/dL  ? BUN 32 (H) 8 - 23 mg/dL  ? Creatinine, Ser 1.31 (H) 0.44 - 1.00 mg/dL  ? Calcium 8.7 (L) 8.9 - 10.3 mg/dL  ? Total Protein 6.6 6.5 - 8.1 g/dL  ? Albumin 2.8 (L) 3.5 - 5.0 g/dL  ? AST 673 (H) 15 - 41 U/L  ? ALT 285 (H) 0 -  44 U/L  ? Alkaline Phosphatase 115 38 - 126 U/L  ? Total Bilirubin 0.8 0.3 - 1.2 mg/dL  ? GFR, Estimated 44 (L) >60 mL/min  ? Anion gap 9 5 - 15  ?Glucose, capillary     Status: None  ? Collection Time: 02/08/22  6:45 AM  ?Result Value Ref Range  ? Glucose-Capillary 99 70 - 99 mg/dL  ? Comment 1 Notify RN   ? Comment 2 Document in Chart   ?  ?I have Reviewed  nursing notes, Vitals, and Lab results since pt's last encounter. Pertinent lab results : see above ?I have ordered test including BMP, CBC, Mg ?I have reviewed the last note from staff over past 24 hours ?I have discussed pt's care plan and test results with nursing staff, case manager ? ? LOS: 1 day  ? ?Lewie Chamber, MD ?[facility] ?02/08/2022, 11:27 AM ? ?

## 2022-02-08 NOTE — Hospital Course (Signed)
Kim Graham is a 71 yo female with PMH chronic pain with methadone dependence, COPD, DM II, history of HCV s/p treatment who presented to the hospital with confusion.  She had endorsed some urinary symptoms on admission including increased frequency but denied dysuria or hematuria.  Due to some worsening weakness and increased confusion she had presented to the hospital. ?Urinalysis was suggestive of UTI and she was started on Rocephin. ?

## 2022-02-08 NOTE — Progress Notes (Addendum)
Cross Cover ?Nurse reports 500 ml of bloody urine maroon in color ?Patient reports every morning since her gallbladder surgery she wakes up with bloody urine and it clears throughout the day ?History of kidney stones ?U/a yesterday with bacteria, yeast, white cells, red cells ?Started on rocephin empirically yesterday ?No imaging  ?CT renal study ordered and diflucan added for yeast in urine ? ?

## 2022-02-08 NOTE — Assessment & Plan Note (Signed)
-   Verified with pharmacy assistance ?- Patient on methadone 100 mg daily.  Last dose was 02/07/2022 prior to admission ?- Methadone resumed in hospital ?

## 2022-02-08 NOTE — Plan of Care (Signed)

## 2022-02-09 ENCOUNTER — Encounter: Payer: Self-pay | Admitting: Internal Medicine

## 2022-02-09 ENCOUNTER — Inpatient Hospital Stay: Payer: 59

## 2022-02-09 DIAGNOSIS — N179 Acute kidney failure, unspecified: Secondary | ICD-10-CM | POA: Diagnosis not present

## 2022-02-09 DIAGNOSIS — N3000 Acute cystitis without hematuria: Secondary | ICD-10-CM | POA: Diagnosis not present

## 2022-02-09 DIAGNOSIS — F1120 Opioid dependence, uncomplicated: Secondary | ICD-10-CM | POA: Diagnosis not present

## 2022-02-09 DIAGNOSIS — R7401 Elevation of levels of liver transaminase levels: Secondary | ICD-10-CM

## 2022-02-09 DIAGNOSIS — G9341 Metabolic encephalopathy: Secondary | ICD-10-CM | POA: Diagnosis not present

## 2022-02-09 LAB — CBC WITH DIFFERENTIAL/PLATELET
Abs Immature Granulocytes: 0.06 10*3/uL (ref 0.00–0.07)
Basophils Absolute: 0 10*3/uL (ref 0.0–0.1)
Basophils Relative: 0 %
Eosinophils Absolute: 0.1 10*3/uL (ref 0.0–0.5)
Eosinophils Relative: 1 %
HCT: 34 % — ABNORMAL LOW (ref 36.0–46.0)
Hemoglobin: 10.6 g/dL — ABNORMAL LOW (ref 12.0–15.0)
Immature Granulocytes: 1 %
Lymphocytes Relative: 5 %
Lymphs Abs: 0.6 10*3/uL — ABNORMAL LOW (ref 0.7–4.0)
MCH: 28.3 pg (ref 26.0–34.0)
MCHC: 31.2 g/dL (ref 30.0–36.0)
MCV: 90.9 fL (ref 80.0–100.0)
Monocytes Absolute: 1.1 10*3/uL — ABNORMAL HIGH (ref 0.1–1.0)
Monocytes Relative: 10 %
Neutro Abs: 9.7 10*3/uL — ABNORMAL HIGH (ref 1.7–7.7)
Neutrophils Relative %: 83 %
Platelets: 148 10*3/uL — ABNORMAL LOW (ref 150–400)
RBC: 3.74 MIL/uL — ABNORMAL LOW (ref 3.87–5.11)
RDW: 14.6 % (ref 11.5–15.5)
WBC: 11.5 10*3/uL — ABNORMAL HIGH (ref 4.0–10.5)
nRBC: 0 % (ref 0.0–0.2)

## 2022-02-09 LAB — COMPREHENSIVE METABOLIC PANEL
ALT: 236 U/L — ABNORMAL HIGH (ref 0–44)
AST: 369 U/L — ABNORMAL HIGH (ref 15–41)
Albumin: 2.4 g/dL — ABNORMAL LOW (ref 3.5–5.0)
Alkaline Phosphatase: 134 U/L — ABNORMAL HIGH (ref 38–126)
Anion gap: 11 (ref 5–15)
BUN: 26 mg/dL — ABNORMAL HIGH (ref 8–23)
CO2: 23 mmol/L (ref 22–32)
Calcium: 8.6 mg/dL — ABNORMAL LOW (ref 8.9–10.3)
Chloride: 111 mmol/L (ref 98–111)
Creatinine, Ser: 1.3 mg/dL — ABNORMAL HIGH (ref 0.44–1.00)
GFR, Estimated: 44 mL/min — ABNORMAL LOW (ref >60)
Glucose, Bld: 88 mg/dL (ref 70–99)
Potassium: 3.5 mmol/L (ref 3.5–5.1)
Sodium: 145 mmol/L (ref 135–145)
Total Bilirubin: 1.3 mg/dL — ABNORMAL HIGH (ref 0.3–1.2)
Total Protein: 6.3 g/dL — ABNORMAL LOW (ref 6.5–8.1)

## 2022-02-09 LAB — URINALYSIS, COMPLETE (UACMP) WITH MICROSCOPIC
Bilirubin Urine: NEGATIVE
Glucose, UA: NEGATIVE mg/dL
Ketones, ur: NEGATIVE mg/dL
Nitrite: NEGATIVE
Protein, ur: 30 mg/dL — AB
RBC / HPF: >50 RBC/hpf — ABNORMAL HIGH (ref 0–5)
Specific Gravity, Urine: 1.015 (ref 1.005–1.030)
Squamous Epithelial / HPF: NONE SEEN (ref 0–5)
WBC, UA: >50 WBC/hpf — ABNORMAL HIGH (ref 0–5)
pH: 6 (ref 5.0–8.0)

## 2022-02-09 LAB — URINE CULTURE

## 2022-02-09 LAB — BLOOD GAS, ARTERIAL
Acid-Base Excess: 0.1 mmol/L (ref 0.0–2.0)
Bicarbonate: 23.6 mmol/L (ref 20.0–28.0)
O2 Saturation: 90 %
Patient temperature: 37
pCO2 arterial: 34 mmHg (ref 32–48)
pH, Arterial: 7.45 (ref 7.35–7.45)
pO2, Arterial: 56 mmHg — ABNORMAL LOW (ref 83–108)

## 2022-02-09 LAB — GLUCOSE, CAPILLARY
Glucose-Capillary: 113 mg/dL — ABNORMAL HIGH (ref 70–99)
Glucose-Capillary: 68 mg/dL — ABNORMAL LOW (ref 70–99)
Glucose-Capillary: 87 mg/dL (ref 70–99)
Glucose-Capillary: 149 mg/dL — ABNORMAL HIGH (ref 70–99)

## 2022-02-09 LAB — MAGNESIUM: Magnesium: 2.3 mg/dL (ref 1.7–2.4)

## 2022-02-09 LAB — AMMONIA: Ammonia: 31 umol/L (ref 9–35)

## 2022-02-09 MED ORDER — ENSURE ENLIVE PO LIQD
237.0000 mL | Freq: Two times a day (BID) | ORAL | Status: DC
  Administered 2022-02-09 – 2022-02-12 (×2): 237 mL via ORAL

## 2022-02-09 MED ORDER — METRONIDAZOLE 500 MG/100ML IV SOLN
500.0000 mg | Freq: Two times a day (BID) | INTRAVENOUS | Status: DC
  Administered 2022-02-09 – 2022-02-10 (×2): 500 mg via INTRAVENOUS
  Filled 2022-02-09 (×2): qty 100

## 2022-02-09 MED ORDER — SODIUM CHLORIDE 0.9 % IV SOLN
2.0000 g | INTRAVENOUS | Status: DC
  Administered 2022-02-10: 2 g via INTRAVENOUS
  Filled 2022-02-09: qty 20

## 2022-02-09 MED ORDER — SENNOSIDES-DOCUSATE SODIUM 8.6-50 MG PO TABS
1.0000 | ORAL_TABLET | Freq: Two times a day (BID) | ORAL | Status: DC
Start: 2022-02-09 — End: 2022-02-13
  Administered 2022-02-09 – 2022-02-13 (×7): 1 via ORAL
  Filled 2022-02-09 (×7): qty 1

## 2022-02-09 MED ORDER — LACTULOSE 10 GM/15ML PO SOLN: 20.0000 g | Freq: Every day | ORAL | Status: DC | PRN

## 2022-02-09 MED ORDER — POLYETHYLENE GLYCOL 3350 17 G PO PACK
17.0000 g | PACK | Freq: Every day | ORAL | Status: DC
  Administered 2022-02-09 – 2022-02-13 (×4): 17 g via ORAL
  Filled 2022-02-09 (×4): qty 1

## 2022-02-09 MED ORDER — DEXTROSE-NACL 5-0.9 % IV SOLN: INTRAVENOUS | Status: DC

## 2022-02-09 NOTE — Consult Note (Signed)
Consultation ? ?Referring Provider:     Dr Frederick Peers ?Admit date 02/07/22 ?Consult date        02/08/22 ?Reason for Consultation:  elevated liver   enzymes ?       ? HPI:   ?Kim Graham is a 71 y.o. female with PMH chronic pain with methadone dependence, COPD, DM II, history of gt 1a HCV a/p treatment with confirmed svr (2021/2022- Dr Servando Snare) who was admitted 2d ago with uti and ams. Note she had ERCP 2/23 with Dr Servando Snare with sphincterotomy/bile duct stent placed for bile duct leak Also had CCY 12/09/21 with Dr Maia Plan for acute cholecystitis. She was last seen by Dr Servando Snare 02/04/22, reported feeling well and that she had stopped etoh. Note she has had chronically elevated transaminases with intermittently elevated alp. There is a past history of ivdu and home tattoos  ?Currently patient is a difficult historian- speech unclear and very drowsy- so I spoke with Amber her nurse who said patient was well, alert and well oriented until shortly after she had her morning dose of methadone- states she has discussed with primary team for dose changes. She has been afebrile and hemodynamically stable, without any GI related concerns. Note she lives alone and has friends for support. Family lives at a distance. Has been started on rocephin ? ?Note 28m ago her transaminases, alp and bilirubin were normal (12/11/21). On admission there was some mild renal insufficiency and ast 129, alt 59, alp 73 and t bili 0.6. the following day ast 673, alt 285, alp 115 and normal bili. Today ast 369. Alt 236, t bili 1.3, alp 134.  ?Platelets normal on admission, 148 today.  Wbc stable. Hgb 10.6. lipase normal. Pt/inr normal. ?Blood cultures negative so far. ?She has a renal stone study yesterday demonstrating concerns for liver cirrhosis. There was pneumobilia consistent with her bile duct stent. Spleen and pancreas ok for that study ?Note she has had uretereal stent for recent obstructing kidney stone (Dr Lonna Cobb) ?Contrasted CT 1/23 IMPRESSION: ?Acute  cholecystitis. There is a biliary dilatation including the ?common bile duct, which tapers distally. No obstructing stone. ? 5 mm obstructing calculus of the mid to distal right ureter with ?proximal hydroureter but no hydronephrosis. Bilateral renal calculi ?including several within the left collecting system. ? ?PREVIOUS ENDOSCOPIES:            ?ERCP 2/23- Dr Servando Snare- dilate main bile duct. Biliary sphincterotomy performed, one stane placed into CBD. ?Cologuard 10/21 negative ?12/21 labs- She is immune to HAV, ferritin elevated, iron normal, ana/sma/ama normal. A1A normal. HCV fibrosure with F3 fibrosis. ?HbSag negative as was HBV surface antibody ? ?Past Medical History:  ?Diagnosis Date  ? COPD (chronic obstructive pulmonary disease) (HCC)   ? Diabetes mellitus type 2, diet-controlled (HCC) 04/15/2018  ? ? ?Past Surgical History:  ?Procedure Laterality Date  ? CYSTOSCOPY W/ URETERAL STENT PLACEMENT Right 12/09/2021  ? Procedure: CYSTOSCOPY WITH RETROGRADE PYELOGRAM/URETERAL STENT PLACEMENT;  Surgeon: Riki Altes, MD;  Location: [facility];  Service: Urology;  Laterality: Right;  ? ENDOSCOPIC RETROGRADE CHOLANGIOPANCREATOGRAPHY (ERCP) WITH PROPOFOL N/A 12/11/2021  ? Procedure: ENDOSCOPIC RETROGRADE CHOLANGIOPANCREATOGRAPHY (ERCP) WITH PROPOFOL;  Surgeon: [name], MD;  Location: [facility];  Service: Endoscopy;  Laterality: N/A;  ? ? ?Family History  ?Problem Relation Age of Onset  ? Diabetes Mother   ? Cancer Father   ?     Pancreatic  ? Diabetes Maternal Aunt   ? Diabetes Maternal Grandmother   ? Dementia Maternal  Grandfather   ? Heart disease Maternal Grandfather   ?  ? ?Social History  ? ?Tobacco Use  ? Smoking status: Every Day  ?  Packs/day: 0.50  ?  Years: 49.00  ?  Pack years: 24.50  ?  Types: Cigarettes  ?  Last attempt to quit: 12/13/2018  ?  Years since quitting: 3.1  ? Smokeless tobacco: Never  ?Vaping Use  ? Vaping Use: Never used  ?Substance Use Topics  ? Alcohol use: Not Currently  ?  Comment: On  occasion  ? Drug use: No  ? ? ?Prior to Admission medications   ?Medication Sig Start Date End Date Taking? Authorizing Provider  ?albuterol (VENTOLIN HFA) 108 (90 Base) MCG/ACT inhaler Inhale 2 puffs into the lungs every 6 (six) hours as needed for wheezing or shortness of breath. 10/14/21  Yes Johnson, Megan P, DO  ?ARIPiprazole (ABILIFY) 20 MG tablet Take 1 tablet (20 mg total) by mouth daily. 10/14/21  Yes Johnson, Megan P, DO  ?ascorbic acid (VITAMIN C) 500 MG tablet Take by mouth.   Yes [provider]  ?METHADONE HCL PO Take 100 mg by mouth daily.   Yes [provider]  ?Multiple Vitamin (MULTI-VITAMIN) tablet Take 1 tablet by mouth daily.   Yes [provider]  ?b complex vitamins capsule Take 1 capsule by mouth daily. ?Patient not taking: Reported on 02/07/2022    [provider]  ?conjugated estrogens (PREMARIN) vaginal cream Place 0.25 Applicatorfuls vaginally daily. ?Patient not taking: Reported on 02/07/2022 04/04/21   Olevia Perches P, DO  ?hydrochlorothiazide (HYDRODIURIL) 25 MG tablet Take 0.5 tablets (12.5 mg total) by mouth daily. ?Patient not taking: Reported on 02/07/2022 01/08/22   Olevia Perches P, DO  ?umeclidinium-vilanterol (ANORO ELLIPTA) 62.5-25 MCG/ACT AEPB Inhale 1 puff into the lungs daily at 6 (six) AM. 10/14/21   Olevia Perches P, DO  ?zinc gluconate 50 MG tablet Take by mouth. ?Patient not taking: Reported on 02/07/2022    [provider]  ? ? ?Current Facility-Administered Medications  ?Medication Dose Route Frequency Provider Last Rate Last Admin  ? 0.9 %  sodium chloride infusion   Intravenous Continuous Lewie Chamber, MD 100 mL/hr at 02/09/22 1013 New Bag at 02/09/22 1013  ? acetaminophen (TYLENOL) tablet 650 mg  650 mg Oral Q6H PRN Agbata, Tochukwu, MD   650 mg at 02/08/22 2131  ? Or  ? acetaminophen (TYLENOL) suppository 650 mg  650 mg Rectal Q6H PRN Agbata, Tochukwu, MD      ? albuterol (PROVENTIL) (2.5 MG/3ML) 0.083% nebulizer solution 3 mL  3  mL Nebulization Q6H PRN Agbata, Tochukwu, MD      ? ARIPiprazole (ABILIFY) tablet 20 mg  20 mg Oral Daily Agbata, Tochukwu, MD   20 mg at 02/09/22 2229  ? ascorbic acid (VITAMIN C) tablet 500 mg  500 mg Oral Daily Agbata, Tochukwu, MD   500 mg at 02/09/22 0814  ? cefTRIAXone (ROCEPHIN) 1 g in sodium chloride 0.9 % 100 mL IVPB  1 g Intravenous Q24H Agbata, Tochukwu, MD 200 mL/hr at 02/09/22 0822 1 g at 02/09/22 7989  ? enoxaparin (LOVENOX) injection 40 mg  40 mg Subcutaneous Q24H Agbata, Tochukwu, MD   40 mg at 02/08/22 2131  ? fluconazole (DIFLUCAN) tablet 100 mg  100 mg Oral Daily Manuela Schwartz, NP   100 mg at 02/09/22 2119  ? lactulose (CHRONULAC) 10 GM/15ML solution 20 g  20 g Oral Daily PRN Lewie Chamber, MD      ?  methadone (DOLOPHINE) 10 MG/ML solution 100 mg  100 mg Oral Daily Lewie ChamberGirguis, David, MD   100 mg at 02/09/22 0815  ? multivitamin with minerals tablet 1 tablet  1 tablet Oral Daily Agbata, Tochukwu, MD   1 tablet at 02/09/22 0813  ? ondansetron (ZOFRAN) tablet 4 mg  4 mg Oral Q6H PRN Agbata, Tochukwu, MD      ? Or  ? ondansetron (ZOFRAN) injection 4 mg  4 mg Intravenous Q6H PRN Agbata, Tochukwu, MD      ? polyethylene glycol (MIRALAX / GLYCOLAX) packet 17 g  17 g Oral Daily Lewie ChamberGirguis, David, MD   17 g at 02/09/22 1242  ? senna-docusate (Senokot-S) tablet 1 tablet  1 tablet Oral BID Lewie ChamberGirguis, David, MD   1 tablet at 02/09/22 1242  ? umeclidinium-vilanterol (ANORO ELLIPTA) 62.5-25 MCG/ACT 1 puff  1 puff Inhalation Q0600 Agbata, Tochukwu, MD   1 puff at 02/09/22 0500  ? ? ?Allergies as of 02/07/2022  ? (No Known Allergies)  ? ? ? ?Review of Systems:    ?All systems reviewed and negative except where noted in HPI. ? ? ? Physical Exam:  ?Vital signs in last 24 hours: ?Temp:  [98.3 ?F (36.8 ?C)-101.2 ?F (38.4 ?C)] 98.3 ?F (36.8 ?C) (04/03 0732) ?Pulse Rate:  [78-81] 79 (04/03 0732) ?Resp:  [14-20] 14 (04/03 0732) ?BP: (137-147)/(60-73) 147/70 (04/03 0732) ?SpO2:  [93 %-98 %] 94 % (04/03 0732) ?Last BM Date :  02/08/22 ?General:   Drowsy woman in NAD ?Head:  Normocephalic and atraumatic. ?Eyes:   No icterus.   Conjunctiva pink. ?Ears:  Normal auditory acuity. ?Mouth: Mucosa pink moist, no lesions. ?Neck:  Su

## 2022-02-09 NOTE — Evaluation (Signed)
Physical Therapy Evaluation ?Patient Details ?Name: Kim Graham ?MRN: [id number] ?DOB: 1951/08/22 ?Today's Date: 02/09/2022 ? ?History of Present Illness ? Alyxis Grippi Varden is a 71 y.o. female with medical history significant for nicotine dependence, COPD, type 2 diabetes mellitus well controlled, history of hepatitis C status posttreatment who presents to the ER via private vehicle for evaluation of feeling unwell.  She was brought into the ER by her friend who noted that she been confused for about 2 days and very weak and unable to get around like she usually would. Recent cholecystectomy. ?  ?Clinical Impression ? Patient received in supine. RN in room. She is quite lethargic, not wanting to eat or drink from lunch tray. RN mentions that her BS is low currently. She is noted to be soiled in bed, NT notified. She required max assist for rolling and supine to sit. Unable to maintain sitting edge of bed without max assist due to heavy posterior leaning when sitting. Patient unsafe to attempt standing at this time. She was cleaned up/linens changed and left in supine with bed alarm on.  Patient will continue to benefit from skilled PT while here to improve functional independence and safety with mobility to hopefully return home at discharge. Due to lethargy at time of evaluation I am recommending SNF, but hope she will improve quickly to go home.   ?   ? ?Recommendations for follow up therapy are one component of a multi-disciplinary discharge planning process, led by the attending physician.  Recommendations may be updated based on patient status, additional functional criteria and insurance authorization. ? ?Follow Up Recommendations Skilled nursing-short term rehab (<3 hours/day) ? ?  ?Assistance Recommended at Discharge Frequent or constant Supervision/Assistance  ?Patient can return home with the following ? A lot of help with walking and/or transfers;A lot of help with bathing/dressing/bathroom;Help with stairs or  ramp for entrance;Assistance with cooking/housework ? ?  ?Equipment Recommendations Rolling walker (2 wheels)  ?Recommendations for Other Services ?    ?  ?Functional Status Assessment Patient has had a recent decline in their functional status and demonstrates the ability to make significant improvements in function in a reasonable and predictable amount of time.  ? ?  ?Precautions / Restrictions Precautions ?Precautions: Fall ?Restrictions ?Weight Bearing Restrictions: No  ? ?  ? ?Mobility ? Bed Mobility ?Overal bed mobility: Needs Assistance ?Bed Mobility: Supine to Sit, Sit to Supine ?  ?  ?Supine to sit: Max assist, HOB elevated ?Sit to supine: Max assist ?  ?General bed mobility comments: patient unable to sit at edge of bed without max assist. Posterior leaning ?  ? ?Transfers ?  ?  ?  ?  ?  ?  ?  ?  ?  ?General transfer comment: unable to safely attempt at this time due to poor balance. ?  ? ?Ambulation/Gait ?  ?  ?  ?  ?  ?  ?  ?General Gait Details: unable/unsafe to attempt ? ?Stairs ?  ?  ?  ?  ?  ? ?Wheelchair Mobility ?  ? ?Modified Rankin (Stroke Patients Only) ?  ? ?  ? ?Balance Overall balance assessment: Needs assistance ?Sitting-balance support: Bilateral upper extremity supported, Feet unsupported ?Sitting balance-Leahy Scale: Poor ?Sitting balance - Comments: requiring max assist to sit up on side of bed. Heavy posterior leaning ?Postural control: Posterior lean ?  ?  ?  ?  ?  ?  ?  ?  ?  ?  ?  ?  ?  ?  ?   ? ? ? ?  Pertinent Vitals/Pain Pain Assessment ?Pain Assessment: No/denies pain  ? ? ?Home Living Family/patient expects to be discharged to:: Private residence ?Living Arrangements: Alone ?Available Help at Discharge: Available PRN/intermittently;Friend(s) ?Type of Home: House ?Home Access: Stairs to enter ?Entrance Stairs-Rails: Left ?Entrance Stairs-Number of Steps: 8 ?  ?Home Layout: One level ?Home Equipment: None ?   ?  ?Prior Function Prior Level of Function : Independent/Modified  Independent;Driving ?  ?  ?  ?  ?  ?  ?  ?  ?  ? ? ?Hand Dominance  ?   ? ?  ?Extremity/Trunk Assessment  ? Upper Extremity Assessment ?Upper Extremity Assessment: Generalized weakness ?  ? ?Lower Extremity Assessment ?Lower Extremity Assessment: Generalized weakness ?  ? ?Cervical / Trunk Assessment ?Cervical / Trunk Assessment: Normal  ?Communication  ? Communication: Other (comment) (difficulty with communication due to grogginess)  ?Cognition Arousal/Alertness: Lethargic ?Behavior During Therapy: Flat affect ?Overall Cognitive Status: No family/caregiver present to determine baseline cognitive functioning ?Area of Impairment: Safety/judgement, Awareness, Problem solving, Following commands, Attention ?  ?  ?  ?  ?  ?  ?  ?  ?  ?Current Attention Level: Sustained ?  ?Following Commands: Follows one step commands inconsistently ?Safety/Judgement: Decreased awareness of deficits, Decreased awareness of safety ?Awareness: Emergent ?Problem Solving: Requires verbal cues, Requires tactile cues ?  ?  ?  ? ?  ?General Comments   ? ?  ?Exercises    ? ?Assessment/Plan  ?  ?PT Assessment Patient needs continued PT services  ?PT Problem List Decreased strength;Decreased mobility;Decreased activity tolerance;Decreased balance;Decreased cognition ? ?   ?  ?PT Treatment Interventions DME instruction;Therapeutic exercise;Gait training;Balance training;Stair training;Functional mobility training;Therapeutic activities;Patient/family education   ? ?PT Goals (Current goals can be found in the Care Plan section)  ?Acute Rehab PT Goals ?Patient Stated Goal: to feel better ?PT Goal Formulation: With patient ?Time For Goal Achievement: 02/23/22 ?Potential to Achieve Goals: Good ? ?  ?Frequency Min 2X/week ?  ? ? ?Co-evaluation   ?  ?  ?  ?  ? ? ?  ?AM-PAC PT "6 Clicks" Mobility  ?Outcome Measure Help needed turning from your back to your side while in a flat bed without using bedrails?: A Lot ?Help needed moving from lying on your  back to sitting on the side of a flat bed without using bedrails?: A Lot ?Help needed moving to and from a bed to a chair (including a wheelchair)?: Total ?Help needed standing up from a chair using your arms (e.g., wheelchair or bedside chair)?: Total ?Help needed to walk in hospital room?: Total ?Help needed climbing 3-5 steps with a railing? : Total ?6 Click Score: 8 ? ?  ?End of Session   ?Activity Tolerance: Patient limited by fatigue;Patient limited by lethargy ?Patient left: in bed;with call bell/phone within reach;with bed alarm set ?Nurse Communication: Mobility status ?PT Visit Diagnosis: Muscle weakness (generalized) (M62.81);Other abnormalities of gait and mobility (R26.89) ?  ? ?Time: 0962-8366 ?PT Time Calculation (min) (ACUTE ONLY): 26 min ? ? ?Charges:   PT Evaluation ?$PT Eval Moderate Complexity: 1 Mod ?PT Treatments ?$Therapeutic Activity: 8-22 mins ?  ?   ? ? ?[employer], PT, G[employer] ?02/09/22,1:43 PM ? ?

## 2022-02-09 NOTE — Progress Notes (Signed)
PHARMACY - PHYSICIAN COMMUNICATION ?CRITICAL VALUE ALERT - BLOOD CULTURE IDENTIFICATION (BCID) ? ?Kim Graham is an 71 y.o. female who presented to [facility] on 02/07/2022 with a chief complaint of AMS / urinary tract infection. She has a recent history of cholecystectomy on 12/23/21 for acute cholecystitis as well as recent ERCP with CBD stenting. GI is following for transaminitis. Patient may also have pneumonia ? ?Assessment:  1/4 bottles GPR. Source not entirely clear. This could represent contaminant or GI / GU source. Of note patient is spiking new fevers this evening up to 102.6. ? ?Name of physician (or Provider) Contacted: Bishop Limbo ? ?Current antibiotics: Ceftriaxone & Diflucan ? ?Changes to prescribed antibiotics recommended:  ?--Consider repeat Bcx ?--Consider repeat Ucx (initial culture grew multiple spp) ?--Increase ceftriaxone to 2 g IV daily ?--Add metronidazole 500 mg IV BID ? ?No results found for this or any previous visit. ? ?[name] ?02/09/2022  9:00 PM ? ?

## 2022-02-09 NOTE — Progress Notes (Signed)
?Progress Note ? ? ? ?Kim Graham   ?[id number]  ?DOB: 04/05/1951  ?DOA: 02/07/2022     2 ?PCP: Kim Roys, DO ? ?Initial CC: AMS ? ?Hospital Course: ?Kim Graham is a 71 yo female with PMH chronic pain with methadone dependence, COPD, DM II, history of HCV s/p treatment who presented to the hospital with confusion.  She had endorsed some urinary symptoms on admission including increased frequency but denied dysuria or hematuria.  Due to some worsening weakness and increased confusion she had presented to the hospital. ?Urinalysis was suggestive of UTI and she was started on Rocephin. ? ?Interval History:  ?No events overnight.  She was a little more lethargic compared to yesterday but able to carry on conversation easily.  She lives alone at home she states. ?Denied any nausea/vomiting nor any abdominal pain. ? ?Assessment and Plan: ?* UTI (urinary tract infection) ?- Patient presented with AMS and possibly increased urinary frequency.  UA notable for negative nitrite, large LE, greater than 50 WBC, many bacteria.  Suspicion is for UTI contributing to her encephalopathy however also cannot rule out contribution of methadone given her advancing age and continued high-dose dosing ?-Follow-up urine culture; still in process ?- Continue Rocephin ?- Mentation remains improved and she is alert and oriented. ? ?AKI (acute kidney injury) ([place]) ?- baseline creatinine ~ 0.6 - 0.9 ?- patient presents with increase in creat >0.3 mg/dL above baseline, creat increase >1.5x baseline presumed to have occurred within past 7 days PTA ?-Creatinine 1.26 on admission ?- Presumed prerenal from poor intake and volume depletion ?- Continue fluids and encouraging good intake ? ? ?Acute metabolic encephalopathy ?- Concern for mixed etiology in setting of UTI and ongoing chronic opioid use ?- Fortunately, mentation remains improved ?- Continue treatment for UTI ?- Resume methadone but monitor for worsening lethargy or decreased  mentation ?- does have some perceived weakness; will ask for PT to eval in anticipation for discharge home maybe tomorrow or Wed ? ?Transaminitis ?- LFTs elevated on admission ?- Recently had cholecystectomy on 12/23/2021 after having developed acute cholecystitis ?- also had ERCP with CBD stent placement prior to CCY ?- AST/ALT downtrending but this morning AP and TB are increased; differential would include obstruction vs cholestasis due to infection vs other causes ?- will ask for GI input in case needs MRCP or further workup ? ?Methadone dependence ([place]) ?- Verified with pharmacy assistance ?- Patient on methadone 100 mg daily.  Last dose was 02/07/2022 prior to admission ?- Methadone resumed in hospital ? ?Diabetes mellitus type 2, diet-controlled ([place]) ?- Continue SSI and CBG monitoring ? ?Depression, recurrent ([place]) ?- Continue Abilify ? ?Tobacco abuse ?Smoking cessation was discussed with patient in detail. ?-Nicotine patch PRN ? ?Chronic obstructive pulmonary disease (HCC) ?Stable and not acutely exacerbated ?Continue as needed bronchodilator therapy as well as inhaled steroids ? ? ?Old records reviewed in assessment of this patient ? ?Antimicrobials: ?Rocephin 02/07/2022 >> current ? ?DVT prophylaxis:  ?enoxaparin (LOVENOX) injection 40 mg Start: 02/07/22 2200 ? ? ?Code Status:   Code Status: Full Code ? ?Disposition Plan:  Home in 1-2 days pending PT eval as well ?Status is: Inpt ? ?Objective: ?Blood pressure (!) 147/70, pulse 79, temperature 98.3 ?F (36.8 ?C), resp. rate 14, height 5\' 4"  (1.626 m), weight 47.6 kg, SpO2 94 %.  ?Examination:  ?Physical Exam ?Constitutional:   ?   General: She is not in acute distress. ?   Appearance: Normal appearance. She is not toxic-appearing.  ?  HENT:  ?   Head: Normocephalic and atraumatic.  ?   Mouth/Throat:  ?   Mouth: Mucous membranes are moist.  ?Eyes:  ?   Extraocular Movements: Extraocular movements intact.  ?Cardiovascular:  ?   Rate and Rhythm: Normal rate and  regular rhythm.  ?Pulmonary:  ?   Effort: Pulmonary effort is normal.  ?   Breath sounds: Normal breath sounds.  ?Abdominal:  ?   General: Bowel sounds are normal.  ?   Palpations: Abdomen is soft.  ?   Comments: No further lower abdominal pain which was present yesterday.  Bowel sounds present.  Abdomen is mildly distended however.  ?Musculoskeletal:     ?   General: Normal range of motion.  ?   Cervical back: Normal range of motion and neck supple.  ?Skin: ?   General: Skin is warm and dry.  ?Neurological:  ?   General: No focal deficit present.  ?   Mental Status: She is alert.  ?Psychiatric:     ?   Mood and Affect: Mood normal.     ?   Behavior: Behavior normal.  ?  ? ?Consultants:  ?GI ? ?Procedures:  ? ? ?Data Reviewed: ?Results for orders placed or performed during the hospital encounter of 02/07/22 (from the past 24 hour(s))  ?Comprehensive metabolic panel     Status: Abnormal  ? Collection Time: 02/08/22  3:08 PM  ?Result Value Ref Range  ? Sodium 141 135 - 145 mmol/L  ? Potassium 3.6 3.5 - 5.1 mmol/L  ? Chloride 109 98 - 111 mmol/L  ? CO2 23 22 - 32 mmol/L  ? Glucose, Bld 82 70 - 99 mg/dL  ? BUN 29 (H) 8 - 23 mg/dL  ? Creatinine, Ser 1.19 (H) 0.44 - 1.00 mg/dL  ? Calcium 8.5 (L) 8.9 - 10.3 mg/dL  ? Total Protein 6.2 (L) 6.5 - 8.1 g/dL  ? Albumin 2.4 (L) 3.5 - 5.0 g/dL  ? AST 559 (H) 15 - 41 U/L  ? ALT 275 (H) 0 - 44 U/L  ? Alkaline Phosphatase 125 38 - 126 U/L  ? Total Bilirubin 0.8 0.3 - 1.2 mg/dL  ? GFR, Estimated 49 (L) >60 mL/min  ? Anion gap 9 5 - 15  ?Glucose, capillary     Status: Abnormal  ? Collection Time: 02/08/22  5:03 PM  ?Result Value Ref Range  ? Glucose-Capillary 63 (L) 70 - 99 mg/dL  ?Glucose, capillary     Status: None  ? Collection Time: 02/08/22  5:53 PM  ?Result Value Ref Range  ? Glucose-Capillary 89 70 - 99 mg/dL  ? Comment 1 Notify RN   ?CBC with Differential/Platelet     Status: Abnormal  ? Collection Time: 02/09/22  3:57 AM  ?Result Value Ref Range  ? WBC 11.5 (H) 4.0 - 10.5 K/uL   ? RBC 3.74 (L) 3.87 - 5.11 MIL/uL  ? Hemoglobin 10.6 (L) 12.0 - 15.0 g/dL  ? HCT 34.0 (L) 36.0 - 46.0 %  ? MCV 90.9 80.0 - 100.0 fL  ? MCH 28.3 26.0 - 34.0 pg  ? MCHC 31.2 30.0 - 36.0 g/dL  ? RDW 14.6 11.5 - 15.5 %  ? Platelets 148 (L) 150 - 400 K/uL  ? nRBC 0.0 0.0 - 0.2 %  ? Neutrophils Relative % 83 %  ? Neutro Abs 9.7 (H) 1.7 - 7.7 K/uL  ? Lymphocytes Relative 5 %  ? Lymphs Abs 0.6 (L) 0.7 - 4.0 K/uL  ?  Monocytes Relative 10 %  ? Monocytes Absolute 1.1 (H) 0.1 - 1.0 K/uL  ? Eosinophils Relative 1 %  ? Eosinophils Absolute 0.1 0.0 - 0.5 K/uL  ? Basophils Relative 0 %  ? Basophils Absolute 0.0 0.0 - 0.1 K/uL  ? Immature Granulocytes 1 %  ? Abs Immature Granulocytes 0.06 0.00 - 0.07 K/uL  ?Magnesium     Status: None  ? Collection Time: 02/09/22  3:57 AM  ?Result Value Ref Range  ? Magnesium 2.3 1.7 - 2.4 mg/dL  ?Comprehensive metabolic panel     Status: Abnormal  ? Collection Time: 02/09/22  3:57 AM  ?Result Value Ref Range  ? Sodium 145 135 - 145 mmol/L  ? Potassium 3.5 3.5 - 5.1 mmol/L  ? Chloride 111 98 - 111 mmol/L  ? CO2 23 22 - 32 mmol/L  ? Glucose, Bld 88 70 - 99 mg/dL  ? BUN 26 (H) 8 - 23 mg/dL  ? Creatinine, Ser 1.30 (H) 0.44 - 1.00 mg/dL  ? Calcium 8.6 (L) 8.9 - 10.3 mg/dL  ? Total Protein 6.3 (L) 6.5 - 8.1 g/dL  ? Albumin 2.4 (L) 3.5 - 5.0 g/dL  ? AST 369 (H) 15 - 41 U/L  ? ALT 236 (H) 0 - 44 U/L  ? Alkaline Phosphatase 134 (H) 38 - 126 U/L  ? Total Bilirubin 1.3 (H) 0.3 - 1.2 mg/dL  ? GFR, Estimated 44 (L) >60 mL/min  ? Anion gap 11 5 - 15  ?Glucose, capillary     Status: Abnormal  ? Collection Time: 02/09/22  6:38 AM  ?Result Value Ref Range  ? Glucose-Capillary 113 (H) 70 - 99 mg/dL  ? Comment 1 Notify RN   ?Glucose, capillary     Status: Abnormal  ? Collection Time: 02/09/22 11:40 AM  ?Result Value Ref Range  ? Glucose-Capillary 68 (L) 70 - 99 mg/dL  ? Comment 1 Notify RN   ? Comment 2 Document in Chart   ?  ?I have Reviewed nursing notes, Vitals, and Lab results since pt's last encounter. Pertinent  lab results : see above ?I have ordered test including BMP, CBC, Mg ?I have reviewed the last note from staff over past 24 hours ?I have discussed pt's care plan and test results with nursing staff, case manage

## 2022-02-10 ENCOUNTER — Inpatient Hospital Stay: Payer: 59

## 2022-02-10 DIAGNOSIS — A419 Sepsis, unspecified organism: Secondary | ICD-10-CM

## 2022-02-10 DIAGNOSIS — G9341 Metabolic encephalopathy: Secondary | ICD-10-CM | POA: Diagnosis not present

## 2022-02-10 DIAGNOSIS — N200 Calculus of kidney: Secondary | ICD-10-CM

## 2022-02-10 DIAGNOSIS — N201 Calculus of ureter: Secondary | ICD-10-CM

## 2022-02-10 DIAGNOSIS — N179 Acute kidney failure, unspecified: Secondary | ICD-10-CM | POA: Diagnosis not present

## 2022-02-10 DIAGNOSIS — N1330 Unspecified hydronephrosis: Secondary | ICD-10-CM

## 2022-02-10 DIAGNOSIS — R6520 Severe sepsis without septic shock: Secondary | ICD-10-CM

## 2022-02-10 DIAGNOSIS — R7881 Bacteremia: Secondary | ICD-10-CM | POA: Diagnosis not present

## 2022-02-10 DIAGNOSIS — N390 Urinary tract infection, site not specified: Secondary | ICD-10-CM

## 2022-02-10 LAB — GLUCOSE, CAPILLARY
Glucose-Capillary: 141 mg/dL — ABNORMAL HIGH (ref 70–99)
Glucose-Capillary: 134 mg/dL — ABNORMAL HIGH (ref 70–99)
Glucose-Capillary: 134 mg/dL — ABNORMAL HIGH (ref 70–99)
Glucose-Capillary: 184 mg/dL — ABNORMAL HIGH (ref 70–99)

## 2022-02-10 LAB — CBC WITH DIFFERENTIAL/PLATELET
Abs Immature Granulocytes: 0.07 10*3/uL (ref 0.00–0.07)
Basophils Absolute: 0 10*3/uL (ref 0.0–0.1)
Basophils Relative: 0 %
Eosinophils Absolute: 0.1 10*3/uL (ref 0.0–0.5)
Eosinophils Relative: 1 %
HCT: 32.7 % — ABNORMAL LOW (ref 36.0–46.0)
Hemoglobin: 10 g/dL — ABNORMAL LOW (ref 12.0–15.0)
Immature Granulocytes: 1 %
Lymphocytes Relative: 6 %
Lymphs Abs: 0.7 10*3/uL (ref 0.7–4.0)
MCH: 27.9 pg (ref 26.0–34.0)
MCHC: 30.6 g/dL (ref 30.0–36.0)
MCV: 91.3 fL (ref 80.0–100.0)
Monocytes Absolute: 0.6 10*3/uL (ref 0.1–1.0)
Monocytes Relative: 6 %
Neutro Abs: 9.1 10*3/uL — ABNORMAL HIGH (ref 1.7–7.7)
Neutrophils Relative %: 86 %
Platelets: 126 10*3/uL — ABNORMAL LOW (ref 150–400)
RBC: 3.58 MIL/uL — ABNORMAL LOW (ref 3.87–5.11)
RDW: 14.9 % (ref 11.5–15.5)
WBC: 10.5 10*3/uL (ref 4.0–10.5)
nRBC: 0 % (ref 0.0–0.2)

## 2022-02-10 LAB — COMPREHENSIVE METABOLIC PANEL
ALT: 153 U/L — ABNORMAL HIGH (ref 0–44)
AST: 147 U/L — ABNORMAL HIGH (ref 15–41)
Albumin: 2.1 g/dL — ABNORMAL LOW (ref 3.5–5.0)
Alkaline Phosphatase: 136 U/L — ABNORMAL HIGH (ref 38–126)
Anion gap: 8 (ref 5–15)
BUN: 21 mg/dL (ref 8–23)
CO2: 22 mmol/L (ref 22–32)
Calcium: 8.1 mg/dL — ABNORMAL LOW (ref 8.9–10.3)
Chloride: 112 mmol/L — ABNORMAL HIGH (ref 98–111)
Creatinine, Ser: 1.28 mg/dL — ABNORMAL HIGH (ref 0.44–1.00)
GFR, Estimated: 45 mL/min — ABNORMAL LOW (ref >60)
Glucose, Bld: 154 mg/dL — ABNORMAL HIGH (ref 70–99)
Potassium: 3.4 mmol/L — ABNORMAL LOW (ref 3.5–5.1)
Sodium: 142 mmol/L (ref 135–145)
Total Bilirubin: 0.7 mg/dL (ref 0.3–1.2)
Total Protein: 5.6 g/dL — ABNORMAL LOW (ref 6.5–8.1)

## 2022-02-10 LAB — MAGNESIUM: Magnesium: 2.1 mg/dL (ref 1.7–2.4)

## 2022-02-10 MED ORDER — PIPERACILLIN-TAZOBACTAM 3.375 G IVPB
3.3750 g | Freq: Three times a day (TID) | INTRAVENOUS | Status: AC
Start: 2022-02-10 — End: 2022-02-12
  Administered 2022-02-10 – 2022-02-12 (×7): 3.375 g via INTRAVENOUS
  Filled 2022-02-10 (×7): qty 50

## 2022-02-10 NOTE — [place] FL2 (Signed)
?[place] MEDICAID FL2 LEVEL OF CARE SCREENING TOOL  ?  ? ?IDENTIFICATION  ?Patient Name: ?Kim Graham Birthdate: April 19, 1951 Sex: female Admission Date (Current Location): ?02/07/2022  ?[place] Number: ? [place] ?  Facility and Address:  ?[facility], [address] [place], [place] [address] ?     Provider Number: ?[id number]  ?Attending Physician Name and Address:  ?Enedina Finner, MD ? Relative Name and Phone Number:  ?Osie Cheeks [phone] ?   ?Current Level of Care: ?Hospital Recommended Level of Care: ?Skilled Nursing Facility Prior Approval Number: ?  ? ?Date Approved/Denied: ?  PASRR Number: ?[id number] A ? ?Discharge Plan: ?SNF ?  ? ?Current Diagnoses: ?Patient Active Problem List  ? Diagnosis Date Noted  ? Acute metabolic encephalopathy 02/07/2022  ? UTI (urinary tract infection) 02/07/2022  ? AKI (acute kidney injury) (HCC) 02/07/2022  ? Transaminitis 02/07/2022  ? Acute cholecystitis 12/09/2021  ? COPD with acute exacerbation (HCC) 11/04/2021  ? IFG (impaired fasting glucose) 10/14/2021  ? Upper respiratory tract infection 09/25/2021  ? Methadone dependence (HCC) 04/04/2021  ? Hep C w/o coma, chronic (HCC) 07/11/2020  ? Methadone use 07/02/2020  ? Diabetes mellitus type 2, diet-controlled (HCC) 04/15/2018  ? Depression, recurrent (HCC) 03/18/2018  ? Advance directive discussed with patient 06/18/2017  ? Chronic obstructive pulmonary disease (HCC) 06/16/2017  ? Tobacco abuse 06/16/2017  ? Chronic nasal congestion 06/16/2017  ? ? ?Orientation RESPIRATION BLADDER Height & Weight   ?  ?[place], Time, Situation, Place ? Normal Continent Weight: 47.6 kg ?Height:  5\' 4"  (162.6 cm)  ?BEHAVIORAL SYMPTOMS/MOOD NEUROLOGICAL BOWEL NUTRITION STATUS  ?    Continent, Incontinent Diet (See DC summary)  ?AMBULATORY STATUS COMMUNICATION OF NEEDS Skin   ?Extensive Assist Verbally Normal ?  ?  ?  ?    ?     ?     ? ? ?Personal Care Assistance Level of Assistance  ?Bathing, Feeding,  Dressing Bathing Assistance: Limited assistance ?Feeding assistance: Independent ?Dressing Assistance: Maximum assistance ?   ? ?Functional Limitations Info  ?    ?  ?   ? ? ?SPECIAL CARE FACTORS FREQUENCY  ?PT (By licensed PT), OT (By licensed OT)   ?  ?PT Frequency: 5 times per week ?OT Frequency: 5 times per week ?  ?  ?  ?   ? ? ?Contractures Contractures Info: Not present  ? ? ?Additional Factors Info  ?Code Status, Allergies Code Status Info: Full code ?Allergies Info: NKDA ?  ?  ?  ?   ? ?Current Medications (02/10/2022):  This is the current hospital active medication list ?Current Facility-Administered Medications  ?Medication Dose Route Frequency Provider Last Rate Last Admin  ? acetaminophen (TYLENOL) tablet 650 mg  650 mg Oral Q6H PRN Agbata, Tochukwu, MD   650 mg at 02/09/22 1645  ? Or  ? acetaminophen (TYLENOL) suppository 650 mg  650 mg Rectal Q6H PRN Agbata, Tochukwu, MD   650 mg at 02/09/22 1837  ? albuterol (PROVENTIL) (2.5 MG/3ML) 0.083% nebulizer solution 3 mL  3 mL Nebulization Q6H PRN Agbata, Tochukwu, MD      ? ARIPiprazole (ABILIFY) tablet 20 mg  20 mg Oral Daily Agbata, Tochukwu, MD   20 mg at 02/09/22 04/11/22  ? ascorbic acid (VITAMIN C) tablet 500 mg  500 mg Oral Daily Agbata, Tochukwu, MD   500 mg at 02/10/22 0926  ? cefTRIAXone (ROCEPHIN) 2 g in sodium chloride 0.9 % 100 mL IVPB  2 g Intravenous Q24H  Foust, Katy L, NP      ? dextrose 5 %-0.9 % sodium chloride infusion   Intravenous Continuous Lewie Chamber, MD 100 mL/hr at 02/10/22 0208 New Bag at 02/10/22 0208  ? enoxaparin (LOVENOX) injection 40 mg  40 mg Subcutaneous Q24H Agbata, Tochukwu, MD   40 mg at 02/09/22 2151  ? feeding supplement (ENSURE ENLIVE / ENSURE PLUS) liquid 237 mL  237 mL Oral BID BM Louellen Molder, NP   237 mL at 02/09/22 1505  ? fluconazole (DIFLUCAN) tablet 100 mg  100 mg Oral Daily Manuela Schwartz, NP   100 mg at 02/09/22 8338  ? lactulose (CHRONULAC) 10 GM/15ML solution 20 g  20 g Oral Daily PRN  Lewie Chamber, MD      ? metroNIDAZOLE (FLAGYL) IVPB 500 mg  500 mg Intravenous BID Foust, Katy L, NP 100 mL/hr at 02/10/22 0941 500 mg at 02/10/22 0941  ? multivitamin with minerals tablet 1 tablet  1 tablet Oral Daily Agbata, Tochukwu, MD   1 tablet at 02/10/22 0926  ? ondansetron (ZOFRAN) tablet 4 mg  4 mg Oral Q6H PRN Agbata, Tochukwu, MD      ? Or  ? ondansetron (ZOFRAN) injection 4 mg  4 mg Intravenous Q6H PRN Agbata, Tochukwu, MD      ? polyethylene glycol (MIRALAX / GLYCOLAX) packet 17 g  17 g Oral Daily Lewie Chamber, MD   17 g at 02/10/22 2505  ? senna-docusate (Senokot-S) tablet 1 tablet  1 tablet Oral BID Lewie Chamber, MD   1 tablet at 02/10/22 0926  ? umeclidinium-vilanterol (ANORO ELLIPTA) 62.5-25 MCG/ACT 1 puff  1 puff Inhalation Q0600 Agbata, Tochukwu, MD   1 puff at 02/10/22 3976  ? ? ? ?Discharge Medications: ?Please see discharge summary for a list of discharge medications. ? ?Relevant Imaging Results: ? ?Relevant Lab Results: ? ? ?Additional Information ?SS# [id number] ? ?[name], RN ? ? ? ? ?

## 2022-02-10 NOTE — Progress Notes (Addendum)
? ?      CROSS COVER NOTE ? ?NAME: Kim Graham ?MRN: [id number] ?DOB : 11-03-1951 ? ? ? ?Date of Service ?  02/09/2022  ?HPI/Events of Note ?  Sign out received from Dr Frederick Peers indicating a CXR was pending and to start Flagyl if CXR showed aspiration pneumonia. ? ?CXR resulted and shows focal pulmonary infiltrate  ?Interventions ?  Plan: ?Flagyl 500 mg IV BID ?Follow culture data ? ?   ?  ? ?Bishop Limbo MHA, MSN, FNP-BC ?Nurse Practitioner ?[facility] ?[place] ?Pager [phone] ? ?

## 2022-02-10 NOTE — H&P (View-Only) (Signed)
? ?Urology Consult ? ?Requesting physician: Fritzi Mandes, MD ? ?Reason for consultation: Left UPJ calculus with hydronephrosis ? ? ?History of Present Illness: Kim Graham is a 71 y.o. female admitted with confusion and blood cultures with gram-positive rods.  She has been febrile to 103 degrees.  She does complain of left lower quadrant abdominal pain. ? ?Admitted 12/09/2021 with acute cholecystitis and underwent partial cholecystectomy.  CT also showed a 6 mm obstructing right ureteral calculus and stent was also placed under the same anesthetic ? ?Admission CT 02/08/2022 shows right ureteral stent in good position without hydronephrosis.  She does have mild to moderate left hydronephrosis secondary to a 16 mm left UPJ stone.  She has additional nonobstructing renal calculi present. ? ?Admission UA with pyuria and microhematuria.  Urine culture with insignificant growth and has been resubmitted. ? ?Underwent ERCP 12/11/2021 for a biliary leak and had a sphincterotomy and common bile duct stent placed. ? ?She has been evaluated by Dr. Delaine Lame.  The etiology of her bacteremia is unknown with potential sources including GI and obstructing stone. ? ?Past Medical History:  ?Diagnosis Date  ? COPD (chronic obstructive pulmonary disease) ([place])   ? Diabetes mellitus type 2, diet-controlled ([place]) 04/15/2018  ? ? ?Past Surgical History:  ?Procedure Laterality Date  ? CYSTOSCOPY W/ URETERAL STENT PLACEMENT Right 12/09/2021  ? Procedure: CYSTOSCOPY WITH RETROGRADE PYELOGRAM/URETERAL STENT PLACEMENT;  Surgeon: Abbie Sons, MD;  Location: [facility] ORS;  Service: Urology;  Laterality: Right;  ? ENDOSCOPIC RETROGRADE CHOLANGIOPANCREATOGRAPHY (ERCP) WITH PROPOFOL N/A 12/11/2021  ? Procedure: ENDOSCOPIC RETROGRADE CHOLANGIOPANCREATOGRAPHY (ERCP) WITH PROPOFOL;  Surgeon: Lucilla Lame, MD;  Location: [facility] ENDOSCOPY;  Service: Endoscopy;  Laterality: N/A;  ? ? ?Home Medications:  ?Current Meds  ?Medication Sig  ? albuterol (VENTOLIN HFA)  108 (90 Base) MCG/ACT inhaler Inhale 2 puffs into the lungs every 6 (six) hours as needed for wheezing or shortness of breath.  ? ARIPiprazole (ABILIFY) 20 MG tablet Take 1 tablet (20 mg total) by mouth daily.  ? ascorbic acid (VITAMIN C) 500 MG tablet Take by mouth.  ? METHADONE HCL PO Take 100 mg by mouth daily.  ? Multiple Vitamin (MULTI-VITAMIN) tablet Take 1 tablet by mouth daily.  ? ? ?Allergies: No Known Allergies ? ?Family History  ?Problem Relation Age of Onset  ? Diabetes Mother   ? Cancer Father   ?     Pancreatic  ? Diabetes Maternal Aunt   ? Diabetes Maternal Grandmother   ? Dementia Maternal Grandfather   ? Heart disease Maternal Grandfather   ? ? ?Social History:  reports that she has been smoking cigarettes. She has a 24.50 pack-year smoking history. She has never used smokeless tobacco. She reports that she does not currently use alcohol. She reports that she does not use drugs. ? ?ROS: ?A complete review of systems was performed.  All systems are negative except for pertinent findings as noted. ? ?Physical Exam:  ?Vital signs in last 24 hours: ?Temp:  [97.6 ?F (36.4 ?C)-103.1 ?F (39.5 ?C)] 97.7 ?F (36.5 ?C) (04/04 2026) ?Pulse Rate:  [63-101] 63 (04/04 2026) ?Resp:  [15-20] 20 (04/04 2026) ?BP: (101-150)/(51-72) 105/57 (04/04 2026) ?SpO2:  [92 %-96 %] 96 % (04/04 2026) ?Constitutional:  Alert, ill-appearing ?HEENT: [place] AT, moist mucus membranes.  Trachea midline, no masses ?Cardiovascular: Regular rate and rhythm ?Respiratory: Normal respiratory effort, lungs clear bilaterally ?GI: Abdomen is soft, distended, mild lower quadrant tenderness ?GU: No CVA tenderness ?Skin: No rashes, bruises or suspicious lesions ?  Lymph: No cervical or inguinal adenopathy ?Neurologic: Grossly intact ? ? ?Laboratory Data:  ?Recent Labs  ?  02/08/22 ?[id number] 02/09/22 ?[id number] 02/10/22 ?0431  ?WBC 9.5 11.5* 10.5  ?HGB 12.5 10.6* 10.0*  ?HCT 40.1 34.0* 32.7*  ? ?Recent Labs  ?  02/08/22 ?1508 02/09/22 ?0357 02/10/22 ?0431  ?NA  141 145 142  ?K 3.6 3.5 3.4*  ?CL 109 111 112*  ?CO2 23 23 22   ?GLUCOSE 82 88 154*  ?BUN 29* 26* 21  ?CREATININE 1.19* 1.30* 1.28*  ?CALCIUM 8.5* 8.6* 8.1*  ? ?No results for input(s): LABPT, INR in the last 72 hours. ?No results for input(s): LABURIN in the last 72 hours. ?Results for orders placed or performed during the hospital encounter of 02/07/22  ?Culture, blood (Routine X 2) w Reflex to ID Panel     Status: None (Preliminary result)  ? Collection Time: 02/07/22  7:39 AM  ? Specimen: BLOOD  ?Result Value Ref Range Status  ? Specimen Description   Final  ?  BLOOD BLOOD RIGHT FOREARM ?Performed at [facility], [address], [place], [place] [address] ?  ? Special Requests   Final  ?  BOTTLES DRAWN AEROBIC AND ANAEROBIC Blood Culture results may not be optimal due to an inadequate volume of blood received in culture bottles ?Performed at [facility], [address], [place], [place] [address] ?  ? Culture  Setup Time   Final  ?  GRAM POSITIVE RODS ?ANAEROBIC BOTTLE ONLY ?CRITICAL RESULT CALLED TO, READ BACK BY AND VERIFIED WITH: ALEX CHAPPELL @ 2054 ON 02/09/2022.Marland KitchenMarland KitchenTKR ?  ? Culture GRAM POSITIVE RODS  Final  ? Report Status PENDING  Incomplete  ?Culture, blood (Routine X 2) w Reflex to ID Panel     Status: None (Preliminary result)  ? Collection Time: 02/07/22  7:44 AM  ? Specimen: BLOOD  ?Result Value Ref Range Status  ? Specimen Description   Final  ?  BLOOD BLOOD LEFT FOREARM ?Performed at [facility], [address] [place], [place] [address] ?  ? Special Requests   Final  ?  BOTTLES DRAWN AEROBIC AND ANAEROBIC Blood Culture results may not be optimal due to an inadequate volume of blood received in culture bottles ?Performed at [facility], [address] [place], [place] [address] ?  ? Culture  Setup Time   Final  ?  GRAM POSITIVE RODS ?ANAEROBIC BOTTLE ONLY ?CRITICAL RESULT CALLED TO, READ BACK BY AND VERIFIED WITH: PHARMD DEVIN MITCHELL ON 02/10/22 @ 10:14 BY  DRT ?Performed at [facility], [place] [address] [place], [place] [address] ?  ? Culture GRAM POSITIVE RODS  Final  ? Report Status PENDING  Incomplete  ?Resp Panel by RT-PCR (Flu A&B, Covid) Nasopharyngeal Swab     Status: None  ? Collection Time: 02/07/22  7:54 AM  ? Specimen: Nasopharyngeal Swab; Nasopharyngeal(NP) swabs in vial transport medium  ?Result Value Ref Range Status  ? SARS Coronavirus 2 by RT PCR NEGATIVE NEGATIVE Final  ?  Comment: (NOTE) ?SARS-CoV-2 target nucleic acids are NOT DETECTED. ? ?The SARS-CoV-2 RNA is generally detectable in upper respiratory ?specimens during the acute phase of infection. The lowest ?concentration of SARS-CoV-2 viral copies this assay can detect is ?138 copies/mL. A negative result does not preclude SARS-Cov-2 ?infection and should not be used as the sole basis for treatment or ?other patient management decisions. A negative result may occur with  ?improper specimen collection/handling, submission of specimen other ?than  nasopharyngeal swab, presence of viral mutation(s) within the ?areas targeted by this assay, and inadequate number of viral ?copies(<138 copies/mL). A negative result must be combined with ?clinical observations, patient history, and epidemiological ?information. The expected result is Negative. ? ?Fact Sheet for Patients:  ?EntrepreneurPulse.com.au ? ?Fact Sheet for Healthcare Providers:  ?IncredibleEmployment.be ? ?This test is no t yet approved or cleared by the [place] FDA and  ?has been authorized for detection and/or diagnosis of SARS-CoV-2 by ?FDA under an Emergency Use Authorization (EUA). This EUA will remain  ?in effect (meaning this test can be used) for the duration of the ?COVID-19 declaration under Section 564(b)(1) of the Act, 21 ?U.S.C.section 360bbb-3(b)(1), unless the authorization is terminated  ?or revoked sooner.  ? ? ?  ? Influenza A by PCR NEGATIVE NEGATIVE Final  ? Influenza B by PCR  NEGATIVE NEGATIVE Final  ?  Comment: (NOTE) ?The Xpert Xpress SARS-CoV-2/FLU/RSV plus assay is intended as an aid ?in the diagnosis of influenza from Nasopharyngeal swab specimens and ?should not be used

## 2022-02-10 NOTE — Consult Note (Signed)
NAME: Kim Graham  ?DOB: Oct 22, 1951  ?MRN: [id number]  ?Date/Time: 02/10/2022 3:17 PM ? ?REQUESTING PROVIDER: Dr.Patel ?Subjective:  ?REASON FOR CONSULT: bacteremia ?? ?Kim Graham is a 71 y.o. with a history of DM, COPD presents with weakness and altered mental status of 1 day duration.  ?Pt was in the hospital 12/09/21-12/13/21  with pain abdomen and found to have gall stones and Dilatation of CBD- she also had rt ureteric stone obstruction- she underwent cystoscopy with rt ureteric stent placement by Dr.Stoioff and  robotic laparoscopic partial cholecystectomy robotic on 12/09/21.  ?There was biliary leak for which she underwent ERCP on 12/11/21  and as The entire main bile duct was dilated,a biliary sphincterotomy was performed.and  One stent was placed into the common bile duct. ?She was discharged home on 2/4- she has been feeling unwell intermittently since then- some days she had pain abdomen and felt weak. Other days she was fine- She saw GI Dr.Wohl on 02/04/22 and she was doing okay and he planned to get the stent out in 1 month ?She was brought to the ED by her firend on 4/1 as she found her weak, confused and poor appetite ?In the ED vitals BP 112/63, HR 76, temp 98.6 ?WBC 11.4, Hb 12.4, cr 1.26. CT head no acute findings- blood culture sent  ?CT abdomen showed rt double J stent without rt hydro, moderate left hydro with stone at the left UPJ, nodular liver and pneumobilia ?She was put on ceftriaxone + flagyl ?Blood culture/gram stain  came back as gram positive rod  in anerobic bottle and I am asked to see her  ? ?Past Medical History:  ?Diagnosis Date  ? COPD (chronic obstructive pulmonary disease) ([place])   ? Diabetes mellitus type 2, diet-controlled ([place]) 04/15/2018  ?  ?Past Surgical History:  ?Procedure Laterality Date  ? CYSTOSCOPY W/ URETERAL STENT PLACEMENT Right 12/09/2021  ? Procedure: CYSTOSCOPY WITH RETROGRADE PYELOGRAM/URETERAL STENT PLACEMENT;  Surgeon: Abbie Sons, MD;  Location: [facility];   Service: Urology;  Laterality: Right;  ? ENDOSCOPIC RETROGRADE CHOLANGIOPANCREATOGRAPHY (ERCP) WITH PROPOFOL N/A 12/11/2021  ? Procedure: ENDOSCOPIC RETROGRADE CHOLANGIOPANCREATOGRAPHY (ERCP) WITH PROPOFOL;  Surgeon: Lucilla Lame, MD;  Location: [facility];  Service: Endoscopy;  Laterality: N/A;  ?  ?Social History  ? ?Socioeconomic History  ? Marital status: Single  ?  Spouse name: Not on file  ? Number of children: Not on file  ? Years of education: Not on file  ? Highest education level: Not on file  ?Occupational History  ? Not on file  ?Tobacco Use  ? Smoking status: Every Day  ?  Packs/day: 0.50  ?  Years: 49.00  ?  Pack years: 24.50  ?  Types: Cigarettes  ?  Last attempt to quit: 12/13/2018  ?  Years since quitting: 3.1  ? Smokeless tobacco: Never  ?Vaping Use  ? Vaping Use: Never used  ?Substance and Sexual Activity  ? Alcohol use: Not Currently  ?  Comment: On occasion  ? Drug use: No  ? Sexual activity: Not Currently  ?Other Topics Concern  ? Not on file  ?Social History Narrative  ? Not on file  ? ?Social Determinants of Health  ? ?Financial Resource Strain: Medium Risk  ? Difficulty of Paying Living Expenses: Somewhat hard  ?Food Insecurity: Food Insecurity Present  ? Worried About Charity fundraiser in the Last Year: Sometimes true  ? Ran Out of Food in the Last Year: Sometimes true  ?Transportation Needs:  No Transportation Needs  ? Lack of Transportation (Medical): No  ? Lack of Transportation (Non-Medical): No  ?Physical Activity: Insufficiently Active  ? Days of Exercise per Week: 4 days  ? Minutes of Exercise per Session: 20 min  ?Stress: No Stress Concern Present  ? Feeling of Stress : Not at all  ?Social Connections: Moderately Isolated  ? Frequency of Communication with Friends and Family: More than three times a week  ? Frequency of Social Gatherings with Friends and Family: Three times a week  ? Attends Religious Services: Never  ? Active Member of Clubs or Organizations: No  ? Attends Theatre manager Meetings: More than 4 times per year  ? Marital Status: Divorced  ?Intimate Partner Violence: Not At Risk  ? Fear of Current or Ex-Partner: No  ? Emotionally Abused: No  ? Physically Abused: No  ? Sexually Abused: No  ?  ?Family History  ?Problem Relation Age of Onset  ? Diabetes Mother   ? Cancer Father   ?     Pancreatic  ? Diabetes Maternal Aunt   ? Diabetes Maternal Grandmother   ? Dementia Maternal Grandfather   ? Heart disease Maternal Grandfather   ? ?No Known Allergies ?I? ?Current Facility-Administered Medications  ?Medication Dose Route Frequency Provider Last Rate Last Admin  ? acetaminophen (TYLENOL) tablet 650 mg  650 mg Oral Q6H PRN Agbata, Tochukwu, MD   650 mg at 02/10/22 1124  ? Or  ? acetaminophen (TYLENOL) suppository 650 mg  650 mg Rectal Q6H PRN Agbata, Tochukwu, MD   650 mg at 02/10/22 1513  ? albuterol (PROVENTIL) (2.5 MG/3ML) 0.083% nebulizer solution 3 mL  3 mL Nebulization Q6H PRN Agbata, Tochukwu, MD      ? ARIPiprazole (ABILIFY) tablet 20 mg  20 mg Oral Daily Fritzi Mandes, MD   20 mg at 02/10/22 1516  ? ascorbic acid (VITAMIN C) tablet 500 mg  500 mg Oral Daily Agbata, Tochukwu, MD   500 mg at 02/10/22 0926  ? cefTRIAXone (ROCEPHIN) 2 g in sodium chloride 0.9 % 100 mL IVPB  2 g Intravenous Q24H Foust, Katy L, NP 200 mL/hr at 02/10/22 1136 2 g at 02/10/22 1136  ? dextrose 5 %-0.9 % sodium chloride infusion   Intravenous Continuous Dwyane Dee, MD 100 mL/hr at 02/10/22 1336 New Bag at 02/10/22 1336  ? enoxaparin (LOVENOX) injection 40 mg  40 mg Subcutaneous Q24H Agbata, Tochukwu, MD   40 mg at 02/09/22 2151  ? feeding supplement (ENSURE ENLIVE / ENSURE PLUS) liquid 237 mL  237 mL Oral BID BM Ok Edwards, NP   237 mL at 02/09/22 1505  ? fluconazole (DIFLUCAN) tablet 100 mg  100 mg Oral Daily Sharion Settler, NP   100 mg at 02/09/22 [id number]  ? lactulose (CHRONULAC) 10 GM/15ML solution 20 g  20 g Oral Daily PRN Dwyane Dee, MD      ? metroNIDAZOLE (FLAGYL) IVPB  500 mg  500 mg Intravenous BID Foust, Katy L, NP   Stopped at 02/10/22 1121  ? multivitamin with minerals tablet 1 tablet  1 tablet Oral Daily Agbata, Tochukwu, MD   1 tablet at 02/10/22 0926  ? ondansetron (ZOFRAN) tablet 4 mg  4 mg Oral Q6H PRN Agbata, Tochukwu, MD      ? Or  ? ondansetron (ZOFRAN) injection 4 mg  4 mg Intravenous Q6H PRN Agbata, Tochukwu, MD      ? polyethylene glycol (MIRALAX / GLYCOLAX) packet 17 g  17 g  Oral Daily Dwyane Dee, MD   17 g at 02/10/22 [id number]  ? senna-docusate (Senokot-S) tablet 1 tablet  1 tablet Oral BID Dwyane Dee, MD   1 tablet at 02/10/22 0926  ? umeclidinium-vilanterol (ANORO ELLIPTA) 62.5-25 MCG/ACT 1 puff  1 puff Inhalation Q0600 Agbata, Tochukwu, MD   1 puff at 02/10/22 [id number]  ?  ? ?Abtx:  ?Anti-infectives (From admission, onward)  ? ? Start     Dose/Rate Route Frequency Ordered Stop  ? 02/10/22 1000  cefTRIAXone (ROCEPHIN) 2 g in sodium chloride 0.9 % 100 mL IVPB       ? 2 g ?200 mL/hr over 30 Minutes Intravenous Every 24 hours 02/09/22 2146    ? 02/09/22 2200  metroNIDAZOLE (FLAGYL) IVPB 500 mg       ? 500 mg ?100 mL/hr over 60 Minutes Intravenous 2 times daily 02/09/22 2146 02/16/22 2159  ? 02/08/22 1000  cefTRIAXone (ROCEPHIN) 1 g in sodium chloride 0.9 % 100 mL IVPB  Status:  Discontinued       ? 1 g ?200 mL/hr over 30 Minutes Intravenous Every 24 hours 02/07/22 1047 02/09/22 2146  ? 02/08/22 1000  fluconazole (DIFLUCAN) tablet 100 mg       ? 100 mg Oral Daily 02/08/22 0511 02/15/22 0959  ? 02/07/22 0915  cefTRIAXone (ROCEPHIN) 1 g in sodium chloride 0.9 % 100 mL IVPB       ? 1 g ?200 mL/hr over 30 Minutes Intravenous  Once 02/07/22 0900 02/07/22 0940  ? ?  ? ? ?REVIEW OF SYSTEMS:  ?Const: negative fever, has chills, some weight loss ?Eyes: negative diplopia or visual changes, negative eye pain ?ENT: negative coryza, negative sore throat ?Resp: negative cough, hemoptysis, dyspnea ?Cards: negative for chest pain, palpitations, lower extremity edema ?GU: negative  for frequency, dysuria and hematuria ?GI: + abdominal pain, + nausea, no diarrhea, poor appetite ?No bleeding, constipation ?Skin: negative for rash and pruritus ?Heme: negative for easy bruising and gum/n

## 2022-02-10 NOTE — Plan of Care (Signed)

## 2022-02-10 NOTE — Consult Note (Signed)
? ?Urology Consult ? ?Requesting physician: Sona Patel, MD ? ?Reason for consultation: Left UPJ calculus with hydronephrosis ? ? ?History of Present Illness: Kim Graham is a 71 y.o. female admitted with confusion and blood cultures with gram-positive rods.  She has been febrile to 103 degrees.  She does complain of left lower quadrant abdominal pain. ? ?Admitted 12/09/2021 with acute cholecystitis and underwent partial cholecystectomy.  CT also showed a 6 mm obstructing right ureteral calculus and stent was also placed under the same anesthetic ? ?Admission CT 02/08/2022 shows right ureteral stent in good position without hydronephrosis.  She does have mild to moderate left hydronephrosis secondary to a 16 mm left UPJ stone.  She has additional nonobstructing renal calculi present. ? ?Admission UA with pyuria and microhematuria.  Urine culture with insignificant growth and has been resubmitted. ? ?Underwent ERCP 12/11/2021 for a biliary leak and had a sphincterotomy and common bile duct stent placed. ? ?She has been evaluated by Dr. Ravishankar.  The etiology of her bacteremia is unknown with potential sources including GI and obstructing stone. ? ?Past Medical History:  ?Diagnosis Date  ? COPD (chronic obstructive pulmonary disease) ([facility])   ? Diabetes mellitus type 2, diet-controlled ([facility]) 04/15/2018  ? ? ?Past Surgical History:  ?Procedure Laterality Date  ? CYSTOSCOPY W/ URETERAL STENT PLACEMENT Right 12/09/2021  ? Procedure: CYSTOSCOPY WITH RETROGRADE PYELOGRAM/URETERAL STENT PLACEMENT;  Surgeon: Soundra Lampley C, MD;  Location: [facility];  Service: Urology;  Laterality: Right;  ? ENDOSCOPIC RETROGRADE CHOLANGIOPANCREATOGRAPHY (ERCP) WITH PROPOFOL N/A 12/11/2021  ? Procedure: ENDOSCOPIC RETROGRADE CHOLANGIOPANCREATOGRAPHY (ERCP) WITH PROPOFOL;  Surgeon: Wohl, Darren, MD;  Location: [facility] END[facility];  Service: Endoscopy;  Laterality: N/A;  ? ? ?Home Medications:  ?Current Meds  ?Medication Sig  ? albuterol (VENTOLIN HFA)  108 (90 Base) MCG/ACT inhaler Inhale 2 puffs into the lungs every 6 (six) hours as needed for wheezing or shortness of breath.  ? ARIPiprazole (ABILIFY) 20 MG tablet Take 1 tablet (20 mg total) by mouth daily.  ? ascorbic acid (VITAMIN C) 500 MG tablet Take by mouth.  ? METHADONE HCL PO Take 100 mg by mouth daily.  ? Multiple Vitamin (MULTI-VITAMIN) tablet Take 1 tablet by mouth daily.  ? ? ?Allergies: No Known Allergies ? ?Family History  ?Problem Relation Age of Onset  ? Diabetes Mother   ? Cancer Father   ?     Pancreatic  ? Diabetes Maternal Aunt   ? Diabetes Maternal Grandmother   ? Dementia Maternal Grandfather   ? Heart disease Maternal Grandfather   ? ? ?Social History:  reports that she has been smoking cigarettes. She has a 24.50 pack-year smoking history. She has never used smokeless tobacco. She reports that she does not currently use alcohol. She reports that she does not use drugs. ? ?ROS: ?A complete review of systems was performed.  All systems are negative except for pertinent findings as noted. ? ?Physical Exam:  ?Vital signs in last 24 hours: ?Temp:  [97.6 ?F (36.4 ?C)-103.1 ?F (39.5 ?C)] 97.7 ?F (36.5 ?C) (04/04 2026) ?Pulse Rate:  [63-101] 63 (04/04 2026) ?Resp:  [15-20] 20 (04/04 2026) ?BP: (101-150)/(51-72) 105/57 (04/04 2026) ?SpO2:  [92 %-96 %] 96 % (04/04 2026) ?Constitutional:  Alert, ill-appearing ?HEENT: [place] AT, moist mucus membranes.  Trachea midline, no masses ?Cardiovascular: Regular rate and rhythm ?Respiratory: Normal respiratory effort, lungs clear bilaterally ?GI: Abdomen is soft, distended, mild lower quadrant tenderness ?GU: No CVA tenderness ?Skin: No rashes, bruises or suspicious lesions ?  Lymph: No cervical or inguinal adenopathy ?Neurologic: Grossly intact ? ? ?Laboratory Data:  ?Recent Labs  ?  02/08/22 ?0545 02/09/22 ?0357 02/10/22 ?0431  ?WBC 9.5 11.5* 10.5  ?HGB 12.5 10.6* 10.0*  ?HCT 40.1 34.0* 32.7*  ? ?Recent Labs  ?  02/08/22 ?1508 02/09/22 ?0357 02/10/22 ?0431  ?NA  141 145 142  ?K 3.6 3.5 3.4*  ?CL 109 111 112*  ?CO2 23 23 22  ?GLUCOSE 82 88 154*  ?BUN 29* 26* 21  ?CREATININE 1.19* 1.30* 1.28*  ?CALCIUM 8.5* 8.6* 8.1*  ? ?No results for input(s): LABPT, INR in the last 72 hours. ?No results for input(s): LABURIN in the last 72 hours. ?Results for orders placed or performed during the hospital encounter of 02/07/22  ?Culture, blood (Routine X 2) w Reflex to ID Panel     Status: None (Preliminary result)  ? Collection Time: 02/07/22  7:39 AM  ? Specimen: BLOOD  ?Result Value Ref Range Status  ? Specimen Description   Final  ?  BLOOD BLOOD RIGHT FOREARM ?Performed at [facility], [address] [place], [place] [address] ?  ? Special Requests   Final  ?  BOTTLES DRAWN AEROBIC AND ANAEROBIC Blood Culture results may not be optimal due to an inadequate volume of blood received in culture bottles ?Performed at [facility], [address] [place], [place] [address] ?  ? Culture  Setup Time   Final  ?  GRAM POSITIVE RODS ?ANAEROBIC BOTTLE ONLY ?CRITICAL RESULT CALLED TO, READ BACK BY AND VERIFIED WITH: ALEX CHAPPELL @ 2054 ON 02/09/2022...TKR ?  ? Culture GRAM POSITIVE RODS  Final  ? Report Status PENDING  Incomplete  ?Culture, blood (Routine X 2) w Reflex to ID Panel     Status: None (Preliminary result)  ? Collection Time: 02/07/22  7:44 AM  ? Specimen: BLOOD  ?Result Value Ref Range Status  ? Specimen Description   Final  ?  BLOOD BLOOD LEFT FOREARM ?Performed at [facility], [address] [place], [place] [address] ?  ? Special Requests   Final  ?  BOTTLES DRAWN AEROBIC AND ANAEROBIC Blood Culture results may not be optimal due to an inadequate volume of blood received in culture bottles ?Performed at [facility], [address] [place], [place] [address] ?  ? Culture  Setup Time   Final  ?  GRAM POSITIVE RODS ?ANAEROBIC BOTTLE ONLY ?CRITICAL RESULT CALLED TO, READ BACK BY AND VERIFIED WITH: PHARMD DEVIN MITCHELL ON 02/10/22 @ 10:14 BY  DRT ?Performed at [facility], [address] [place], [place] [address] ?  ? Culture GRAM POSITIVE RODS  Final  ? Report Status PENDING  Incomplete  ?Resp Panel by RT-PCR (Flu A&B, Covid) Nasopharyngeal Swab     Status: None  ? Collection Time: 02/07/22  7:54 AM  ? Specimen: Nasopharyngeal Swab; Nasopharyngeal(NP) swabs in vial transport medium  ?Result Value Ref Range Status  ? SARS Coronavirus 2 by RT PCR NEGATIVE NEGATIVE Final  ?  Comment: (NOTE) ?SARS-CoV-2 target nucleic acids are NOT DETECTED. ? ?The SARS-CoV-2 RNA is generally detectable in upper respiratory ?specimens during the acute phase of infection. The lowest ?concentration of SARS-CoV-2 viral copies this assay can detect is ?138 copies/mL. A negative result does not preclude SARS-Cov-2 ?infection and should not be used as the sole basis for treatment or ?other patient management decisions. A negative result may occur with  ?improper specimen collection/handling, submission of specimen other ?than   nasopharyngeal swab, presence of viral mutation(s) within the ?areas targeted by this assay, and inadequate number of viral ?copies(<138 copies/mL). A negative result must be combined with ?clinical observations, patient history, and epidemiological ?information. The expected result is Negative. ? ?Fact Sheet for Patients:  ?https://www.fda.gov/media/152166/download ? ?Fact Sheet for Healthcare Providers:  ?https://www.fda.gov/media/152162/download ? ?This test is no t yet approved or cleared by the [place] FDA and  ?has been authorized for detection and/or diagnosis of SARS-CoV-2 by ?FDA under an Emergency Use Authorization (EUA). This EUA will remain  ?in effect (meaning this test can be used) for the duration of the ?COVID-19 declaration under Section 564(b)(1) of the Act, 21 ?U.S.C.section 360bbb-3(b)(1), unless the authorization is terminated  ?or revoked sooner.  ? ? ?  ? Influenza A by PCR NEGATIVE NEGATIVE Final  ? Influenza B by PCR  NEGATIVE NEGATIVE Final  ?  Comment: (NOTE) ?The Xpert Xpress SARS-CoV-2/FLU/RSV plus assay is intended as an aid ?in the diagnosis of influenza from Nasopharyngeal swab specimens and ?should not be used

## 2022-02-10 NOTE — TOC Progression Note (Signed)
Transition of Care (TOC) - Progression Note  ? ? ?Patient Details  ?Name: Kim Graham ?MRN: [id number] ?Date of Birth: 20-Nov-1950 ? ?Transition of Care (TOC) CM/SW Contact  ?Conception Oms, RN ?Phone Number: ?02/10/2022, 11:23 AM ? ?Clinical Narrative:    ?Bedsearch sent, FL2 complete, PASSR obtained, Will review beds once received offers ? ? ?  ?  ? ?Expected Discharge Plan and Services ?  ?  ?  ?  ?  ?                ?  ?  ?  ?  ?  ?  ?  ?  ?  ?  ? ? ?Social Determinants of Health (SDOH) Interventions ?  ? ?Readmission Risk Interventions ?   ? View : No data to display.  ?  ?  ?  ? ? ?

## 2022-02-10 NOTE — Progress Notes (Signed)
?  Progress Note ? ? ?PatientMarland Kitchen Alda Gaultney Graham [id number] DOB: 05/28/1951 DOA: 02/07/2022     3 ?DOS: the patient was seen and examined on 02/10/2022 ?  ?Brief hospital course: ?Seen yesterday for elevated liver enzymes- " ?Elevated liver enzymes - acute on chornic elevation since admission- do note presentation did appear in alcoholic pattern and has been improving over the last 2d- and she has history of etoh and substance use will assess peth test, also history of recent bile duct stent recently placed due to come out next month-  ruq [place] to assess bile ducts better, consider further liver testing as clinically indicated ?AMS- currently suspect this is methadone related as she was well alert prior to med administration- dose to be adjusted/modified as already initiated by her nurse- defer further eval to primary team - could be liver related- note lactulose has been started- and her ammonia level was normal- question of withdrawal but it is uncertain that patient has been currently intaking etoh"    ?Note patient developed fever of 102 overnight appears to have UTI, +/- mild pneumonia per ua/cxr overnight. She has gram + rods in 1/4 blood cultures- given the presence of biliary stent- and limited abdominal history/exam due to patient's drowsiness/sedation it was reviewed with Dr Mia Creek and Dr Servando Snare (who placed stent)- although it is more likely that she would have gram negative bacteria if this were truly cholangitis, consdieration of stent removal has been recommended for today as it is foreign body and could potentially contribute to her current illness.  Evidently it was to be removed this month but patient wanted to delay at the time This was discussed with patient who is more alert today but still somewhat sedate. She denies any abdominal pain and although she feels sob at times, states her breathing is stable and not any worse than normal. Discussed her with her nurse, NPO time 0830- had few bites of muffin/egg  this am but not much. ?[place] noted to have patent stent, cirrhotic appearing liver. ?She is AAOX3, resps unlabored without retractions, color w/d/pink RRR, abdominal soft ND/NT with + bowel sounds ? ? ? ?Subjective:  ? ?Physical Exam: ?Vitals:  ? 02/09/22 2156 02/10/22 0038 02/10/22 8466 02/10/22 5993  ?BP:  (!) 101/52 110/62 (!) 124/53  ?Pulse:  70 66 72  ?Resp: 19 18 20 15   ?Temp: 98.9 ?F (37.2 ?C) 98.2 ?F (36.8 ?C) 97.6 ?F (36.4 ?C) 98.5 ?F (36.9 ?C)  ?TempSrc:  Oral    ?SpO2: 92% 93% 96% 96%  ?Weight:      ?Height:      ? ? ? ?Data Reviewed: ?Prior notes labs imaging ? ?Author: ?Rayce Brahmbhatt, , NP ?02/10/2022 11:14 AM ? ?For on call review [email or web address].  ?  ?

## 2022-02-10 NOTE — Progress Notes (Signed)
[facility]  - [place] [facility] [place] ? ? ?PATIENT NAME: Kim Graham   ? ?MR#:  [id number] ? ?DATE OF BIRTH:  [birth year] ? ?SUBJECTIVE:  ? ?patient sitting out in the recliner. She is temperature 102 last night and Tmax 103 this morning. Looks very weak and debilitated. Currently NPO. No vomiting. Denies any abdominal pain. She is oriented to place and person. No family at bedside. ? ? ?VITALS:  ?Blood pressure (!) 131/51, pulse 83, temperature (!) 101.5 ?F (38.6 ?C), resp. rate 16, height 5\' 4"  (1.626 m), weight 47.6 kg, SpO2 94 %. ? ?PHYSICAL EXAMINATION:  ? ?GENERAL:  71 y.o.-year-old patient lying in the bed with no acute distress. Looks very deconditioned weak and ill ?LUNGS: decreased breath sounds bilaterally, no wheezing, rales, rhonchi. No respiratory distress ?CARDIOVASCULAR: S1, S2 normal. No murmurs, rubs, or gallops.  ?ABDOMEN: Soft, nontender, nondistended.  ?EXTREMITIES: No  edema b/l.    ?NEUROLOGIC: nonfocal  patient is alert and awake ? ? ?LABORATORY PANEL:  ?CBC ?Recent Labs  ?Lab 02/10/22 ?0431  ?WBC 10.5  ?HGB 10.0*  ?HCT 32.7*  ?PLT 126*  ? ? ?Chemistries  ?Recent Labs  ?Lab 02/10/22 ?0431  ?NA 142  ?K 3.4*  ?CL 112*  ?CO2 22  ?GLUCOSE 154*  ?BUN 21  ?CREATININE 1.28*  ?CALCIUM 8.1*  ?MG 2.1  ?AST 147*  ?ALT 153*  ?ALKPHOS 136*  ?BILITOT 0.7  ? ?Cardiac Enzymes ?No results for input(s): TROPONINI in the last 168 hours. ?RADIOLOGY:  ?DG Chest Port 1 View ? ?Result Date: 02/09/2022 ?CLINICAL DATA:  Fever, COPD EXAM: PORTABLE CHEST 1 VIEW COMPARISON:  02/07/2022 FINDINGS: There is diffuse interstitial thickening, new since prior examination which may relate to diffuse diffuse trace interstitial pulmonary edema or inflammatory infiltrate. More focal pulmonary infiltrate is seen within the retrocardiac region, likely infectious in the appropriate clinical setting. No pneumothorax or pleural effusion. Cardiac size within normal limits. Remote fracture deformity within the right humeral  head. No acute bone abnormality. IMPRESSION: Focal pulmonary infiltrate within the retrocardiac region, likely infectious in the appropriate clinical setting. Superimposed diffuse interstitial pulmonary infiltrate may reflect trace interstitial pulmonary edema or diffuse inflammatory infiltrate. Electronically Signed   By: 04/09/2022 M.D.   On: 02/09/2022 20:45  ? ?04/11/2022 ABDOMEN LIMITED RUQ (LIVER/GB) ? ?Result Date: 02/10/2022 ?CLINICAL DATA:  Elevated liver function tests. Cholecystectomy. Bile duct stent placed February 2023. Hepatitis-C. EXAM: ULTRASOUND ABDOMEN LIMITED RIGHT UPPER QUADRANT COMPARISON:  02/08/2022 unenhanced CT abdomen/pelvis FINDINGS: Gallbladder: Surgically absent. Common bile duct: Diameter: 5 mm.  Stent visualized within the common bile duct. Liver: Diffusely coarsened liver parenchymal echotexture with mildly irregular liver surface, compatible with cirrhosis. No liver masses. Portal vein is patent on color Doppler imaging with normal direction of blood flow towards the liver. Other: None. IMPRESSION: 1. Cirrhosis.  No liver masses. 2. Status post cholecystectomy. Stent visualized within normal caliber common bile duct. Electronically Signed   By: 04/10/2022 M.D.   On: 02/10/2022 08:11   ? ?Assessment and Plan ? ?Ms. [name] is a 71 yo female with PMH chronic pain with methadone dependence, COPD, DM II, history of HCV s/p treatment who presented to the hospital with confusion.  She had endorsed some urinary symptoms on admission including increased frequency but denied dysuria or hematuria ? ?Gram-positive rod Sepsis  ?--Source urine vs GI (CBD stent) vs Right LL pneumonia ?aspiration ?-- patient currently on IV Zosyn ?-- white count normal ?-- fever of 103, tachycardia, abnormal UA ?--  infectious disease consultation with Dr. Joylene Draft ? ?right lower lobe pneumonia (CXR 02/09/22) ?COPD/tobacco abuse ?-- NPO for now ?-- speech therapy to see patient eval swallow/rule out aspiration ?-- sats 95  to 96% on room air ?-- continue IV Zosyn ?-- advised smoking cessation ?-- PRN inhalers nebs ? ?bilateral ureteral stones status post right ureteral stent placement in January 2023 ?-- CT abdomen on admission showed left UPG stone with mild to moderate hydronephrosis, patent right ureteral stent ?-- urology consultation with Dr. Lonna Cobb ?-- NPO for now ?-- continue IV fluids, IV antibiotics ? ?Transaminitis ?-- LFTs elevated on admission ?-- patient recently had Lap cholecystectomy on 12/09/2021 ?--ERCP with CBD stent placement  12/11/21 by Dr Servando Snare ?-- LFTs trending down ?-- patient seen by Dr. Mia Creek from G.I.-- no indication for CBD stent removal at present ? ?acute metabolic encephalopathy ?-- suspected due to sepsis, chronic methadone use, AK I ?-- mental status improving ? ?Chronic Methadone dependence ?-- pt takes Methadone 100 mg daily last dose 4/1 ?--will hold for now till pt a bit better  ? ?AKI appears prerenal azotemia in the setting of sepsis ?--Baseline creat 0.6--0.9 ?-- came in with creatinine of 1.26 ?-- continue IV fluids ? ?Depression ?-- continue Abilify ? ? ? ? ?Procedures: ?Family communication : friend Sibyl Parr ?Consults : urology, G.I., ID ?CODE STATUS: full ?DVT Prophylaxis : enoxaparin ?Level of care: Med-Surg ?Status is: Inpatient ?Remains inpatient appropriate because: ongoing sepsis workup ? ?anticipate discharge 3-4 days ? ?TOTAL TIME TAKING CARE OF THIS PATIENT: 30 minutes.  ?>50% time spent on counselling and coordination of care ? ?Note: This dictation was prepared with Dragon dictation along with smaller phrase technology. Any transcriptional errors that result from this process are unintentional. ? ?Enedina Finner M.D  ? ? ?[facility]  ? ?CC: ?Primary care physician; Dorcas Carrow, DO  ?

## 2022-02-10 NOTE — Care Management Important Message (Signed)
Important Message ? ?Patient Details  ?Name: Kim Graham ?MRN: [id number] ?Date of Birth: June 03, 1951 ? ? ?Medicare Important Message Given:  Yes ? ? ? ? ?Olegario Messier A Athony Coppa ?02/10/2022, 2:38 PM ?

## 2022-02-10 NOTE — Evaluation (Signed)
Clinical/Bedside Swallow Evaluation ?Patient Details  ?Name: Kim Graham ?MRN: [id number] ?Date of Birth: 07-22-1951 ? ?Today's Date: 02/10/2022 ?Time: SLP Start Time (ACUTE ONLY): 1410 SLP Stop Time (ACUTE ONLY): 1500 ?SLP Time Calculation (min) (ACUTE ONLY): 50 min ? ?Past Medical History:  ?Past Medical History:  ?Diagnosis Date  ? COPD (chronic obstructive pulmonary disease) (HCC)   ? Diabetes mellitus type 2, diet-controlled (HCC) 04/15/2018  ? ?Past Surgical History:  ?Past Surgical History:  ?Procedure Laterality Date  ? CYSTOSCOPY W/ URETERAL STENT PLACEMENT Right 12/09/2021  ? Procedure: CYSTOSCOPY WITH RETROGRADE PYELOGRAM/URETERAL STENT PLACEMENT;  Surgeon: Riki Altes, MD;  Location: [facility] ORS;  Service: Urology;  Laterality: Right;  ? ENDOSCOPIC RETROGRADE CHOLANGIOPANCREATOGRAPHY (ERCP) WITH PROPOFOL N/A 12/11/2021  ? Procedure: ENDOSCOPIC RETROGRADE CHOLANGIOPANCREATOGRAPHY (ERCP) WITH PROPOFOL;  Surgeon: [name], MD;  Location: [facility] ENDOSCOPY;  Service: Endoscopy;  Laterality: N/A;  ? ?HPI:  ?Pt is a 71 y.o. female with medical history significant for nicotine dependence, etoh and substance use, COPD, type 2 diabetes mellitus well controlled, history of hepatitis C status posttreatment who presents to the ER via private vehicle for evaluation of feeling unwell.  She was brought into the ER by her friend who noted that she been confused for about 2 days and very weak and unable to get around like she usually would.  Patient states that she feels "bad".  She complains of abdominal pain mostly around the periumbilical area.  Admitted w/ AKI, UTI, methadone use baseline.  Recently had cholecystectomy on 12/23/2021 after having developed acute cholecystitis  - also had ERCP with CBD stent placement prior to CCY.  GI consult for possible need of MRCP or further workup; noted cirrhotic appearing liver.  CT abdomen on admission showed left UPG stone with mild to moderate hydronephrosis, patent right  ureteral stent  -- urology consultation ongoing.  Patient developed fever of 102 overnight appears to have UTI, +/- mild pneumonia per ua/cxr overnight; MD requested BSE.  ?  ?Assessment / Plan / Recommendation  ?Clinical Impression ?  Pt seen for BSE today. She appears weak and min drowsy; friend arrived during session and stated pt "has lost weight" by her observation. Pt is unable to wear her Upper Denture plate d/t ill-fit (attempted x2 but removed); she has poor/missing Dentition status of lower teeth.  ?Per MD note, she currently has AKI w/ UTI; acute metabolic encephalopathy(concern for mixed etiology in setting of UTI and ongoing chronic opioid use; methadone resumed but monitoring for worsening lethargy or decreased mentation).  ?No overt s/s of aspiration noted w/ trials consumed at this evaluation. Pt though remains NPO per MD d/t possible surgery this evening. Consulted MD re: possible coated tongue and need for oral rinse or IV tx to address. Frequent oral care currently. ?ST services will f/u tomorrow w/ toleration of diet when able to resume an oral diet. NSG updated.  ?SLP Visit Diagnosis: Dysphagia, oral phase (R13.11) (poor/missing dentition; generalized weakness overall) ?   ?Aspiration Risk ? Mild aspiration risk;Risk for inadequate nutrition/hydration (reduced following general aspiration precautions)  ?  ?Diet Recommendation   NPO w/ frequent oral care; general aspiration precautions ? ?Medication Administration: Whole meds with puree (for safer, easier swallowing)  ?  ?Other  Recommendations Recommended Consults:  (GI following; Dietician f/u) ?Oral Care Recommendations: Oral care BID;Oral care before and after PO;Staff/trained caregiver to provide oral care (pt setup and support) ?Other Recommendations:  (n/a)   ? ?Recommendations for follow up therapy are one component of  a multi-disciplinary discharge planning process, led by the attending physician.  Recommendations may be updated based on  patient status, additional functional criteria and insurance authorization. ? ?Follow up Recommendations No SLP follow up (TBD)  ? ? ?  ?Assistance Recommended at Discharge Intermittent Supervision/Assistance  ?Functional Status Assessment Patient has had a recent decline in their functional status and/or demonstrates limited ability to make significant improvements in function in a reasonable and predictable amount of time  ?Frequency and Duration min 2x/week  ?1 week ?  ?   ? ?Prognosis Prognosis for Safe Diet Advancement: Fair ?Barriers to Reach Goals: Time post onset;Severity of deficits;Motivation;Behavior ?Barriers/Prognosis Comment: poor/missing dentition; generalized weakness overall  ? ?  ? ?Swallow Study   ?General Date of Onset: 02/07/22 ?HPI: Pt is a 71 y.o. female with medical history significant for nicotine dependence, etoh and substance use, COPD, type 2 diabetes mellitus well controlled, history of hepatitis C status posttreatment who presents to the ER via private vehicle for evaluation of feeling unwell.  She was brought into the ER by her friend who noted that she been confused for about 2 days and very weak and unable to get around like she usually would.  Patient states that she feels "bad".  She complains of abdominal pain mostly around the periumbilical area.  Admitted w/ AKI, UTI, methadone use baseline.  Recently had cholecystectomy on 12/23/2021 after having developed acute cholecystitis  - also had ERCP with CBD stent placement prior to CCY.  GI consult for possible need of MRCP or further workup; noted cirrhotic appearing liver.  CT abdomen on admission showed left UPG stone with mild to moderate hydronephrosis, patent right ureteral stent  -- urology consultation ongoing.  Patient developed fever of 102 overnight appears to have UTI, +/- mild pneumonia per ua/cxr overnight; MD requested BSE. ?Type of Study: Bedside Swallow Evaluation ?Previous Swallow Assessment: none ?Diet Prior to this  Study: Regular;Thin liquids ?Temperature Spikes Noted: Yes (wbc 10.5) ?Respiratory Status: Room air ?History of Recent Intubation: No ?Behavior/Cognition: Alert;Cooperative;Pleasant mood;Requires cueing (min drowsy overall; weak w/ slow motor responses) ?Oral Cavity Assessment: Dry (min) ?Oral Care Completed by SLP: Yes ?Oral Cavity - Dentition: Poor condition;Missing dentition (cannot wear Upper denture plate d/t ill-fit) ?Vision: Functional for Butler-feeding ?[place]-Feeding Abilities: Able to feed [place];Needs assist;Needs set up (weak overall) ?Patient Positioning: Upright in chair ?Baseline Vocal Quality: Normal;Low vocal intensity ?Volitional Cough: Strong ?Volitional Swallow: Able to elicit  ?  ?Oral/Motor/Sensory Function Overall Oral Motor/Sensory Function: Within functional limits   ?Ice Chips Ice chips: Within functional limits ?Presentation: Spoon (fed; 2 trials)   ?Thin Liquid Thin Liquid: Within functional limits ?Presentation: Cup;[place] Fed;Straw (5 trials via each) ?Other Comments: did a little better using a straw d/t overall effort/exertion  ?  ?Nectar Thick Nectar Thick Liquid: Not tested   ?Honey Thick Honey Thick Liquid: Not tested   ?Puree Puree: Within functional limits (adequate) ?Presentation: [place] Fed;Spoon (5 trials) ?Other Comments: took her time to clear fully w/ trials   ?Solid ? ? ?  Solid: Impaired ?Oral Phase Impairments: Impaired mastication (poor/missing dentition) ?Oral Phase Functional Implications: Prolonged oral transit;Impaired mastication ?Pharyngeal Phase Impairments:  (none)  ? ?  ? ? ? ?Jerilynn Som, MS, CCC-SLP ?Speech Language Pathologist ?Rehab Services; [facility] [place] ?[phone] (ascom) ?Ashlon Lottman ?02/10/2022,6:22 PM ? ? ? ?

## 2022-02-10 NOTE — Progress Notes (Signed)
?   02/09/22 1956  ?Assess: MEWS Score  ?Temp (!) 102.4 ?F (39.1 ?C)  ?BP (!) 147/62  ?Pulse Rate 90  ?Resp 20  ?SpO2 90 %  ?O2 Device Room Air  ?Assess: MEWS Score  ?MEWS Temp 2  ?MEWS Systolic 0  ?MEWS Pulse 0  ?MEWS RR 0  ?MEWS LOC 0  ?MEWS Score 2  ?MEWS Score Color Yellow  ?Assess: if the MEWS score is Yellow or Red  ?Were vital signs taken at a resting state? Yes  ?Does the patient meet 2 or more of the SIRS criteria? No  ?MEWS guidelines implemented *See Row Information* Yes  ?Treat  ?MEWS Interventions Administered prn meds/treatments  ?Pain Scale 0-10  ?Pain Score 0  ?Pain Type Acute pain  ?Pain Location Abdomen  ?Pain Orientation Mid  ?Pain Radiating Towards none  ?Pain Descriptors / Indicators Aching  ?Pain Frequency Intermittent  ?Pain Onset With Activity  ?Patients Stated Pain Goal 0  ?Pain Intervention(s) Medication (See eMAR)  ?Multiple Pain Sites No  ?Take Vital Signs  ?Increase Vital Sign Frequency  Yellow: Q 2hr X 2 then Q 4hr X 2, if remains yellow, continue Q 4hrs  ?Escalate  ?MEWS: Escalate Yellow: discuss with charge nurse/RN and consider discussing with provider and RRT  ?Notify: Charge Nurse/RN  ?Name of Charge Nurse/RN Administrator, RN  ?Date Charge Nurse/RN Notified 02/09/22  ?Time Charge Nurse/RN Notified 1952  ?Assess: SIRS CRITERIA  ?SIRS Temperature  1  ?SIRS Pulse 0  ?SIRS Respirations  0  ?SIRS WBC 0  ?SIRS Score Sum  1  ? ? ?

## 2022-02-11 ENCOUNTER — Telehealth: Payer: Self-pay

## 2022-02-11 ENCOUNTER — Encounter
Admission: EM | Disposition: A | Discharge status: Skilled Nursing Facility | Payer: Self-pay | Source: Home / Self Care | Attending: Internal Medicine

## 2022-02-11 ENCOUNTER — Inpatient Hospital Stay: Payer: 59 | Admitting: Certified Registered"

## 2022-02-11 ENCOUNTER — Encounter: Payer: Self-pay | Admitting: Internal Medicine

## 2022-02-11 ENCOUNTER — Inpatient Hospital Stay: Payer: 59

## 2022-02-11 DIAGNOSIS — N136 Pyonephrosis: Secondary | ICD-10-CM | POA: Diagnosis not present

## 2022-02-11 DIAGNOSIS — N390 Urinary tract infection, site not specified: Secondary | ICD-10-CM | POA: Diagnosis not present

## 2022-02-11 DIAGNOSIS — N1330 Unspecified hydronephrosis: Secondary | ICD-10-CM | POA: Diagnosis not present

## 2022-02-11 DIAGNOSIS — R7401 Elevation of levels of liver transaminase levels: Secondary | ICD-10-CM | POA: Diagnosis not present

## 2022-02-11 DIAGNOSIS — A419 Sepsis, unspecified organism: Secondary | ICD-10-CM | POA: Diagnosis not present

## 2022-02-11 DIAGNOSIS — N201 Calculus of ureter: Secondary | ICD-10-CM | POA: Diagnosis not present

## 2022-02-11 HISTORY — PX: CYSTOSCOPY/URETEROSCOPY/HOLMIUM LASER/STENT PLACEMENT: SHX6546

## 2022-02-11 LAB — COMPREHENSIVE METABOLIC PANEL
ALT: 104 U/L — ABNORMAL HIGH (ref 0–44)
AST: 54 U/L — ABNORMAL HIGH (ref 15–41)
Albumin: 2.1 g/dL — ABNORMAL LOW (ref 3.5–5.0)
Alkaline Phosphatase: 117 U/L (ref 38–126)
Anion gap: 7 (ref 5–15)
BUN: 19 mg/dL (ref 8–23)
CO2: 24 mmol/L (ref 22–32)
Calcium: 8.3 mg/dL — ABNORMAL LOW (ref 8.9–10.3)
Chloride: 112 mmol/L — ABNORMAL HIGH (ref 98–111)
Creatinine, Ser: 1.37 mg/dL — ABNORMAL HIGH (ref 0.44–1.00)
GFR, Estimated: 42 mL/min — ABNORMAL LOW (ref >60)
Glucose, Bld: 147 mg/dL — ABNORMAL HIGH (ref 70–99)
Potassium: 3.3 mmol/L — ABNORMAL LOW (ref 3.5–5.1)
Sodium: 143 mmol/L (ref 135–145)
Total Bilirubin: 0.5 mg/dL (ref 0.3–1.2)
Total Protein: 5.8 g/dL — ABNORMAL LOW (ref 6.5–8.1)

## 2022-02-11 LAB — URINE CULTURE: Culture: 20000 — AB

## 2022-02-11 LAB — GRAM STAIN: Gram Stain: NONE SEEN

## 2022-02-11 LAB — GLUCOSE, CAPILLARY
Glucose-Capillary: 131 mg/dL — ABNORMAL HIGH (ref 70–99)
Glucose-Capillary: 112 mg/dL — ABNORMAL HIGH (ref 70–99)
Glucose-Capillary: 107 mg/dL — ABNORMAL HIGH (ref 70–99)
Glucose-Capillary: 132 mg/dL — ABNORMAL HIGH (ref 70–99)
Glucose-Capillary: 131 mg/dL — ABNORMAL HIGH (ref 70–99)
Glucose-Capillary: 277 mg/dL — ABNORMAL HIGH (ref 70–99)

## 2022-02-11 SURGERY — CYSTOSCOPY/URETEROSCOPY/HOLMIUM LASER/STENT PLACEMENT
Anesthesia: General | Site: Urethra | Laterality: Bilateral

## 2022-02-11 MED ORDER — ONDANSETRON HCL 4 MG/2ML IJ SOLN
INTRAMUSCULAR | Status: AC
  Filled 2022-02-11: qty 2

## 2022-02-11 MED ORDER — IPRATROPIUM-ALBUTEROL 0.5-2.5 (3) MG/3ML IN SOLN
RESPIRATORY_TRACT | Status: AC
  Filled 2022-02-11: qty 3

## 2022-02-11 MED ORDER — CHLORHEXIDINE GLUCONATE CLOTH 2 % EX PADS
6.0000 | MEDICATED_PAD | Freq: Every day | CUTANEOUS | Status: DC
  Administered 2022-02-12 – 2022-02-13 (×2): 6 via TOPICAL

## 2022-02-11 MED ORDER — ONDANSETRON HCL 4 MG/2ML IJ SOLN
INTRAMUSCULAR | Status: DC | PRN
  Administered 2022-02-11: 4 mg via INTRAVENOUS

## 2022-02-11 MED ORDER — FENTANYL CITRATE (PF) 100 MCG/2ML IJ SOLN: 25.0000 ug | INTRAMUSCULAR | Status: DC | PRN

## 2022-02-11 MED ORDER — OXYCODONE HCL 5 MG PO TABS: 5.0000 mg | ORAL_TABLET | Freq: Once | ORAL | Status: DC | PRN

## 2022-02-11 MED ORDER — LIDOCAINE HCL (PF) 2 % IJ SOLN
INTRAMUSCULAR | Status: AC
Start: 2022-02-11 — End: ?
  Filled 2022-02-11: qty 5

## 2022-02-11 MED ORDER — ACETAMINOPHEN 10 MG/ML IV SOLN
INTRAVENOUS | Status: AC
  Administered 2022-02-11: 700 mg via INTRAVENOUS
  Filled 2022-02-11: qty 100

## 2022-02-11 MED ORDER — SODIUM CHLORIDE 0.9 % IR SOLN
Status: DC | PRN
  Administered 2022-02-11: 3000 mL via INTRAVESICAL

## 2022-02-11 MED ORDER — DEXAMETHASONE SODIUM PHOSPHATE 10 MG/ML IJ SOLN
INTRAMUSCULAR | Status: AC
  Filled 2022-02-11: qty 1

## 2022-02-11 MED ORDER — ACETAMINOPHEN 10 MG/ML IV SOLN: 700.0000 mg | INTRAVENOUS | Status: AC

## 2022-02-11 MED ORDER — STERILE WATER FOR IRRIGATION IR SOLN
Status: DC | PRN
  Administered 2022-02-11: 500 mL

## 2022-02-11 MED ORDER — LACTATED RINGERS IV SOLN: INTRAVENOUS | Status: DC | PRN

## 2022-02-11 MED ORDER — DEXAMETHASONE SODIUM PHOSPHATE 10 MG/ML IJ SOLN
INTRAMUSCULAR | Status: DC | PRN
  Administered 2022-02-11: 5 mg via INTRAVENOUS

## 2022-02-11 MED ORDER — IOHEXOL 180 MG/ML  SOLN
INTRAMUSCULAR | Status: DC | PRN
Start: 2022-02-11 — End: 2022-02-11
  Administered 2022-02-11 (×2): 10 mL

## 2022-02-11 MED ORDER — ROCURONIUM BROMIDE 10 MG/ML (PF) SYRINGE
PREFILLED_SYRINGE | INTRAVENOUS | Status: AC
  Filled 2022-02-11: qty 10

## 2022-02-11 MED ORDER — FENTANYL CITRATE (PF) 100 MCG/2ML IJ SOLN
INTRAMUSCULAR | Status: DC | PRN
  Administered 2022-02-11 (×2): 25 ug via INTRAVENOUS

## 2022-02-11 MED ORDER — LIDOCAINE HCL (CARDIAC) PF 100 MG/5ML IV SOSY
PREFILLED_SYRINGE | INTRAVENOUS | Status: DC | PRN
  Administered 2022-02-11: 80 mg via INTRAVENOUS

## 2022-02-11 MED ORDER — OXYCODONE HCL 5 MG/5ML PO SOLN: 5.0000 mg | Freq: Once | ORAL | Status: DC | PRN

## 2022-02-11 MED ORDER — PROPOFOL 10 MG/ML IV BOLUS
INTRAVENOUS | Status: AC
  Filled 2022-02-11: qty 20

## 2022-02-11 MED ORDER — FENTANYL CITRATE (PF) 100 MCG/2ML IJ SOLN
INTRAMUSCULAR | Status: AC
  Filled 2022-02-11: qty 2

## 2022-02-11 MED ORDER — EPHEDRINE SULFATE (PRESSORS) 50 MG/ML IJ SOLN
INTRAMUSCULAR | Status: DC | PRN
  Administered 2022-02-11: 10 mg via INTRAVENOUS
  Administered 2022-02-11: 5 mg via INTRAVENOUS

## 2022-02-11 MED ORDER — ENOXAPARIN SODIUM 30 MG/0.3ML IJ SOSY
30.0000 mg | PREFILLED_SYRINGE | INTRAMUSCULAR | Status: DC
Start: 2022-02-11 — End: 2022-02-13
  Administered 2022-02-11 – 2022-02-12 (×2): 30 mg via SUBCUTANEOUS
  Filled 2022-02-11 (×2): qty 0.3

## 2022-02-11 MED ORDER — PROPOFOL 10 MG/ML IV BOLUS
INTRAVENOUS | Status: DC | PRN
  Administered 2022-02-11: 40 mg via INTRAVENOUS
  Administered 2022-02-11: 80 mg via INTRAVENOUS

## 2022-02-11 MED ORDER — IPRATROPIUM-ALBUTEROL 0.5-2.5 (3) MG/3ML IN SOLN
3.0000 mL | RESPIRATORY_TRACT | Status: AC
  Administered 2022-02-11: 3 mL via RESPIRATORY_TRACT

## 2022-02-11 MED ORDER — SUCCINYLCHOLINE CHLORIDE 200 MG/10ML IV SOSY
PREFILLED_SYRINGE | INTRAVENOUS | Status: AC
Start: 2022-02-11 — End: ?
  Filled 2022-02-11: qty 10

## 2022-02-11 SURGICAL SUPPLY — 30 items
BAG DRAIN CYSTO-URO LG1000N (MISCELLANEOUS) ×2 IMPLANT
BASKET ZERO TIP 1.9FR (BASKET) IMPLANT
BRUSH SCRUB 4% CHG (MISCELLANEOUS) ×1 IMPLANT
BRUSH SCRUB EZ 1% IODOPHOR (MISCELLANEOUS) ×1 IMPLANT
CATH URET FLEX-TIP 2 LUMEN 10F (CATHETERS) IMPLANT
CATH URETL OPEN END 6X70 (CATHETERS) IMPLANT
CNTNR SPEC 2.5X3XGRAD LEK (MISCELLANEOUS) ×2
CONT SPEC 4OZ STER OR WHT (MISCELLANEOUS) ×2
CONTAINER SPEC 2.5X3XGRAD LEK (MISCELLANEOUS) IMPLANT
DRAPE UTILITY 15X26 TOWEL STRL (DRAPES) ×2 IMPLANT
GAUZE 4X4 16PLY ~~LOC~~+RFID DBL (SPONGE) ×4 IMPLANT
GLOVE SURG UNDER POLY LF SZ7.5 (GLOVE) ×2 IMPLANT
GOWN STRL REUS W/ TWL LRG LVL3 (GOWN DISPOSABLE) ×1 IMPLANT
GOWN STRL REUS W/ TWL XL LVL3 (GOWN DISPOSABLE) ×1 IMPLANT
GOWN STRL REUS W/TWL LRG LVL3 (GOWN DISPOSABLE) ×1
GOWN STRL REUS W/TWL XL LVL3 (GOWN DISPOSABLE) ×1
GUIDEWIRE STR DUAL SENSOR (WIRE) ×2 IMPLANT
INFUSOR MANOMETER BAG 3000ML (MISCELLANEOUS) ×1 IMPLANT
IV NS IRRIG 3000ML ARTHROMATIC (IV SOLUTION) ×2 IMPLANT
KIT TURNOVER CYSTO (KITS) ×2 IMPLANT
PACK CYSTO AR (MISCELLANEOUS) ×2 IMPLANT
SET CYSTO W/LG BORE CLAMP LF (SET/KITS/TRAYS/PACK) ×2 IMPLANT
SHEATH URETERAL 12FRX35CM (MISCELLANEOUS) IMPLANT
STENT URET 6FRX24 CONTOUR (STENTS) ×1 IMPLANT
STENT URET 6FRX26 CONTOUR (STENTS) IMPLANT
STENT URETL SOFT 4.8X22 (STENTS) ×1 IMPLANT
SURGILUBE 2OZ TUBE FLIPTOP (MISCELLANEOUS) ×2 IMPLANT
TRACTIP FLEXIVA PULSE ID 200 (Laser) ×2 IMPLANT
VALVE UROSEAL ADJ ENDO (VALVE) IMPLANT
WATER STERILE IRR 500ML POUR (IV SOLUTION) ×2 IMPLANT

## 2022-02-11 NOTE — Progress Notes (Signed)
PHARMACIST - PHYSICIAN COMMUNICATION ? ?CONCERNING:  Enoxaparin (Lovenox) for DVT Prophylaxis  ? ? ?RECOMMENDATION: ?Patient was prescribed enoxaparin 40mg  q24 hours for VTE prophylaxis.  ? Weights  ? 02/07/22 0723  ?Weight: 47.6 kg (105 lb)  ? ? ?Body mass index is 18.02 kg/m?. ? ?Estimated Creatinine Clearance: 28.7 mL/min (A) (by C-G formula based on SCr of 1.37 mg/dL (H)). ? ?Patient is candidate for enoxaparin 30mg  every 24 hours based on CrCl <43ml/min or Weight <45kg ? ?DESCRIPTION: ?Pharmacy has adjusted enoxaparin dose per [facility] policy. ? ?Patient is now receiving enoxaparin 30 mg every 24 hours  ? ?31m ?02/11/2022 ?6:40 PM  ?

## 2022-02-11 NOTE — Progress Notes (Signed)
[facility]  - [place] at [facility] ? ? ?PATIENT NAME: Kim Graham   ? ?MR#:  [id number] ? ?DATE OF BIRTH:  1951/01/30 ? ?SUBJECTIVE:  ?patient got back from her urology procedure. She is afebrile. More awake alert. No family at bedside. She tells me she is thirsty and hungry. Worked with physical therapy. ? ?VITALS:  ?Blood pressure (!) 141/60, pulse 100, temperature 99.6 ?F (37.6 ?C), temperature source Oral, resp. rate 15, height 5\' 4"  (1.626 m), weight 47.6 kg, SpO2 91 %. ? ?PHYSICAL EXAMINATION:  ? ?GENERAL:  71 y.o.-year-old patient lying in the bed with no acute distress. Looks very deconditioned weak and ill ?LUNGS: decreased breath sounds bilaterally, no wheezing, rales, rhonchi. No respiratory distress ?CARDIOVASCULAR: S1, S2 normal. No murmurs, rubs, or gallops.  ?ABDOMEN: Soft, nontender, nondistended. Foley+ ?EXTREMITIES: No  edema b/l.    ?NEUROLOGIC: nonfocal  patient is alert and awake ? ? ?LABORATORY PANEL:  ?CBC ?Recent Labs  ?Lab 02/10/22 ?0431  ?WBC 10.5  ?HGB 10.0*  ?HCT 32.7*  ?PLT 126*  ? ? ? ?Chemistries  ?Recent Labs  ?Lab 02/10/22 ?0431 02/11/22 ?0414  ?NA 142 143  ?K 3.4* 3.3*  ?CL 112* 112*  ?CO2 22 24  ?GLUCOSE 154* 147*  ?BUN 21 19  ?CREATININE 1.28* 1.37*  ?CALCIUM 8.1* 8.3*  ?MG 2.1  --   ?AST 147* 54*  ?ALT 153* 104*  ?ALKPHOS 136* 117  ?BILITOT 0.7 0.5  ? ? ?Cardiac Enzymes ?No results for input(s): TROPONINI in the last 168 hours. ?RADIOLOGY:  ?DG Chest Port 1 View ? ?Result Date: 02/09/2022 ?CLINICAL DATA:  Fever, COPD EXAM: PORTABLE CHEST 1 VIEW COMPARISON:  02/07/2022 FINDINGS: There is diffuse interstitial thickening, new since prior examination which may relate to diffuse diffuse trace interstitial pulmonary edema or inflammatory infiltrate. More focal pulmonary infiltrate is seen within the retrocardiac region, likely infectious in the appropriate clinical setting. No pneumothorax or pleural effusion. Cardiac size within normal limits. Remote fracture deformity  within the right humeral head. No acute bone abnormality. IMPRESSION: Focal pulmonary infiltrate within the retrocardiac region, likely infectious in the appropriate clinical setting. Superimposed diffuse interstitial pulmonary infiltrate may reflect trace interstitial pulmonary edema or diffuse inflammatory infiltrate. Electronically Signed   By: Helyn Numbers M.D.   On: 02/09/2022 20:45  ? ?DG OR UROLOGY CYSTO IMAGE ([facility] ONLY) ? ?Result Date: 02/11/2022 ?There is no interpretation for this exam.  This order is for images obtained during a surgical procedure.  Please See "Surgeries" Tab for more information regarding the procedure.  ? ?US ABDOMEN LIMITED RUQ (LIVER/GB) ? ?Result Date: 02/10/2022 ?CLINICAL DATA:  Elevated liver function tests. Cholecystectomy. Bile duct stent placed February 2023. Hepatitis-C. EXAM: ULTRASOUND ABDOMEN LIMITED RIGHT UPPER QUADRANT COMPARISON:  02/08/2022 unenhanced CT abdomen/pelvis FINDINGS: Gallbladder: Surgically absent. Common bile duct: Diameter: 5 mm.  Stent visualized within the common bile duct. Liver: Diffusely coarsened liver parenchymal echotexture with mildly irregular liver surface, compatible with cirrhosis. No liver masses. Portal vein is patent on color Doppler imaging with normal direction of blood flow towards the liver. Other: None. IMPRESSION: 1. Cirrhosis.  No liver masses. 2. Status post cholecystectomy. Stent visualized within normal caliber common bile duct. Electronically Signed   By: Delbert Phenix M.D.   On: 02/10/2022 08:11   ? ?Assessment and Plan ? ?Kim Graham is a 71 yo female with PMH chronic pain with methadone dependence, COPD, DM II, history of HCV s/p treatment who presented to the hospital with confusion.  She had endorsed some urinary symptoms on admission including increased frequency but denied dysuria or hematuria ? ?Gram-positive rod Sepsis  ?--Source urine vs GI (CBD stent) vs Right LL pneumonia ?aspiration ?-- patient currently on IV Zosyn ?--  white count normal ?-- 4/4--fever of 103, tachycardia, abnormal UA ?-- infectious disease consultation with Kim Graham ?--4/5--no fever ? ?right lower lobe pneumonia (CXR 02/09/22) ?COPD/tobacco abuse ?-- NPO for now ?-- speech therapy to see patient eval swallow/rule out aspiration ?-- sats 95 to 96% on room air ?-- continue IV Zosyn ?-- advised smoking cessation ?-- PRN inhalers nebs ? ?bilateral ureteral stones status post right ureteral stent placement in January 2023 ?-- CT abdomen on admission showed left UPG stone with mild to moderate hydronephrosis, patent right ureteral stent ?-- urology consultation with Kim. Bernardo Graham ?-4/5 s/p left ureteral stent placement and exchange of right ureteral stent. ?--UC from 4/5 sent ? ?Transaminitis ?-- LFTs elevated on admission ?-- patient recently had Lap cholecystectomy on 12/09/2021 ?--ERCP with CBD stent placement  12/11/21 by Kim Graham ?-- LFTs trending down ?-- patient seen by Kim Graham from G.I[facility] no indication for CBD stent removal at present ? ?acute metabolic encephalopathy ?-- suspected due to sepsis, chronic methadone use, AK I ?-- mental status improving ? ?Chronic Methadone dependence ?-- pt takes Methadone 100 mg daily last dose 4/1 ?--will hold for now till pt a bit better  ? ?AKI appears prerenal azotemia in the setting of sepsis ?Mild hypokalemia ?--Baseline creat 0.6--0.9 ?-- came in with creatinine of 1.26 ?-- continue IV fluids ? ?Depression ?-- continue Abilify ? ?PT recommends rehab. TOC for d/c planning ? ? ?Procedures: ?Family communication : friend Kim Graham 4/4 ?Consults : urology, G.I., ID ?CODE STATUS: full ?DVT Prophylaxis : enoxaparin ?Level of care: Med-Surg ?Status is: Inpatient ?Remains inpatient appropriate because: ongoing sepsis workup ? ?anticipate discharge 1-3 days ? ?TOTAL TIME TAKING CARE OF THIS PATIENT: 366minutes.  ?>50% time spent on counselling and coordination of care ? ?Note: This dictation was prepared with Dragon  dictation along with smaller phrase technology. Any transcriptional errors that result from this process are unintentional. ? ?Fritzi Mandes M.D  ? ? ?[facility]  ? ?CC: ?Primary care physician; Valerie Roys, DO  ?

## 2022-02-11 NOTE — Interval H&P Note (Signed)
History and Physical Interval Note: ? ?02/11/2022 ?11:12 AM ? ?Kim Graham  has presented today for surgery, with the diagnosis of Left Cysto Ureteral Stent ?Right Ureteral Stent Exchange.  The various methods of treatment have been discussed with the patient and family. After consideration of risks, benefits and other options for treatment, the patient has consented to  Procedure(s): ?CYSTOSCOPY/URETEROSCOPY/HOLMIUM LASER/STENT PLACEMENT & RIGHT URETERAL STENT EXCHANGE (Left) as a surgical intervention.  The patient's history has been reviewed, patient examined, no change in status, stable for surgery.  I have reviewed the patient's chart and labs.  Questions were answered to the patient's satisfaction.   ? ? ?[name] ? ? ?

## 2022-02-11 NOTE — Progress Notes (Signed)
Physical Therapy Treatment ?Patient Details ?Name: Kim Graham ?MRN: [id number] ?DOB: 1951/02/04 ?Today's Date: 02/11/2022 ? ? ?History of Present Illness Loida Calamia Weld is a 71 y.o. female with medical history significant for nicotine dependence, COPD, type 2 diabetes mellitus well controlled, history of hepatitis C status posttreatment who presents to the ER via private vehicle for evaluation of feeling unwell. Recent cholecystectomy. MD assessment includes: sepsis, RLL PNA, transaminitis, acute metabolic encephalopathy, and AKI.  Pt now s/p cystoscopy, L ureteral stent placement, R ureteral stent exchange, and bilateral retrograde pyelograms 02/11/22. ? ?  ?PT Comments  ? ? Pt was pleasant and motivated to participate during the session and put forth good effort throughout. Pt required significant physical assistance with bed mobility tasks but was able to come to standing this session and to take several steps with only min A.  Pt presented with poor static sitting balance initially with significant posterior instability but grossly improved after anterior weight shifting activities. Overall pt remains at a very high risk of falls and further functional decline and would not be safe to return to her prior living situation at this time.  Pt will benefit from PT services in a SNF setting upon discharge to safely address deficits listed in patient problem list for decreased caregiver assistance and eventual return to PLOF. ? ?   ?Recommendations for follow up therapy are one component of a multi-disciplinary discharge planning process, led by the attending physician.  Recommendations may be updated based on patient status, additional functional criteria and insurance authorization. ? ?Follow Up Recommendations ? Skilled nursing-short term rehab (<3 hours/day) ?  ?  ?Assistance Recommended at Discharge Frequent or constant Supervision/Assistance  ?Patient can return home with the following A lot of help with walking  and/or transfers;A lot of help with bathing/dressing/bathroom;Help with stairs or ramp for entrance;Assistance with cooking/housework;Assist for transportation ?  ?Equipment Recommendations ? Rolling walker (2 wheels)  ?  ?Recommendations for Other Services   ? ? ?  ?Precautions / Restrictions Precautions ?Precautions: Fall ?Restrictions ?Weight Bearing Restrictions: No  ?  ? ?Mobility ? Bed Mobility ?Overal bed mobility: Needs Assistance ?Bed Mobility: Rolling, Sit to Sidelying, Sidelying to Sit ?Rolling: Mod assist ?Sidelying to sit: Max assist ?  ?  ?Sit to sidelying: Max assist ?General bed mobility comments: Log roll training provided with mod to max verbal cues for sequencing ?  ? ?Transfers ?Overall transfer level: Needs assistance ?Equipment used: Rolling walker (2 wheels) ?Transfers: Sit to/from Stand ?Sit to Stand: Min assist, From elevated surface ?  ?  ?  ?  ?  ?General transfer comment: Min A to come to standing and to prevent posterior LOB while in standing ?  ? ?Ambulation/Gait ?Ambulation/Gait assistance: Min assist ?Gait Distance (Feet): 4 Feet ?Assistive device: Rolling walker (2 wheels) ?Gait Pattern/deviations: Decreased step length - right, Step-through pattern, Decreased step length - left ?Gait velocity: decreased ?  ?  ?General Gait Details: Min A for stability with pt able to take several small steps forwards/backwards and sideways at EOB ? ? ?Stairs ?  ?  ?  ?  ?  ? ? ?Wheelchair Mobility ?  ? ?Modified Rankin (Stroke Patients Only) ?  ? ? ?  ?Balance Overall balance assessment: Needs assistance ?Sitting-balance support: Bilateral upper extremity supported, Feet unsupported ?Sitting balance-Leahy Scale: Poor ?Sitting balance - Comments: Mod to max A initially to prevent posterior LOB in sitting but improved after anterior weight shifting activities to where pt required CGA with occasional  min A ?Postural control: Posterior lean ?Standing balance support: Bilateral upper extremity supported,  During functional activity ?Standing balance-Leahy Scale: Poor ?Standing balance comment: Min A to prevent posterior LOB ?  ?  ?  ?  ?  ?  ?  ?  ?  ?  ?  ?  ? ?  ?Cognition Arousal/Alertness: Awake/alert ?Behavior During Therapy: Flat affect ?Overall Cognitive Status: Within Functional Limits for tasks assessed ?  ?  ?  ?  ?  ?  ?  ?  ?  ?  ?  ?  ?  ?  ?  ?  ?  ?  ?  ? ?  ?Exercises Total Joint Exercises ?Ankle Circles/Pumps: AROM, Strengthening, Both, 5 reps, 10 reps ?Quad Sets: Strengthening, Both, 5 reps, 10 reps ?Gluteal Sets: Strengthening, Both, 5 reps, 10 reps ?Hip ABduction/ADduction: AROM, Strengthening, Both, 10 reps ?Long Arc Quad: AROM, Strengthening, Both, 10 reps, 5 reps ?Knee Flexion: AROM, Strengthening, Both, 5 reps, 10 reps ?Other Exercises: log roll training provided ?Other Exercises: HEP education for BLE APs, QS, and GS ?Other Exercises: Anterior weight shifting activities in sitting to address posterior LOB ? ?  ?General Comments   ?  ?  ? ?Pertinent Vitals/Pain Pain Assessment ?Pain Assessment: No/denies pain  ? ? ?Home Living   ?  ?  ?  ?  ?  ?  ?  ?  ?  ?   ?  ?Prior Function    ?  ?  ?   ? ?PT Goals (current goals can now be found in the care plan section) Progress towards PT goals: Progressing toward goals ? ?  ?Frequency ? ? ? Min 2X/week ? ? ? ?  ?PT Plan Current plan remains appropriate  ? ? ?Co-evaluation   ?  ?  ?  ?  ? ?  ?AM-PAC PT "6 Clicks" Mobility   ?Outcome Measure ? Help needed turning from your back to your side while in a flat bed without using bedrails?: A Lot ?Help needed moving from lying on your back to sitting on the side of a flat bed without using bedrails?: A Lot ?Help needed moving to and from a bed to a chair (including a wheelchair)?: A Lot ?Help needed standing up from a chair using your arms (e.g., wheelchair or bedside chair)?: A Lot ?Help needed to walk in hospital room?: Total ?Help needed climbing 3-5 steps with a railing? : Total ?6 Click Score: 10 ? ?   ?End of Session Equipment Utilized During Treatment: Gait belt ?Activity Tolerance: Patient tolerated treatment well ?Patient left: in bed;with call bell/phone within reach;with bed alarm set ?Nurse Communication: Mobility status ?PT Visit Diagnosis: Muscle weakness (generalized) (M62.81);Other abnormalities of gait and mobility (R26.89) ?  ? ? ?Time: 7353-2992 ?PT Time Calculation (min) (ACUTE ONLY): 23 min ? ?Charges:  $Therapeutic Exercise: 8-22 mins ?$Therapeutic Activity: 8-22 mins          ?          ? ?D. Elly Modena PT, DPT ?02/11/22, 3:36 PM ? ? ?

## 2022-02-11 NOTE — Plan of Care (Signed)

## 2022-02-11 NOTE — Transfer of Care (Signed)
Immediate Anesthesia Transfer of Care Note ? ?Patient: Kim Graham ? ?Procedure(s) Performed: CYSTOSCOPY/URETEROSCOPY/HOLMIUM LASER/STENT PLACEMENT & RIGHT URETERAL STENT EXCHANGE (Bilateral: Urethra) ? ?Patient Location: PACU ? ?Anesthesia Type:General ? ?Level of Consciousness: drowsy and patient cooperative ? ?Airway & Oxygen Therapy: Patient Spontanous Breathing and Patient connected to face mask oxygen ? ?Post-op Assessment: Report given to RN and Post -op Vital signs reviewed and stable ? ?Post vital signs: Reviewed and stable ? ?Last Vitals:  ?Vitals Value Taken Time  ?BP 134/65 02/11/22 1217  ?Temp    ?Pulse 108 02/11/22 1223  ?Resp 21 02/11/22 1223  ?SpO2 100 % 02/11/22 1223  ?Vitals shown include unvalidated device data. ? ?Last Pain:  ?Vitals:  ? 02/11/22 1023  ?TempSrc: Temporal  ?PainSc: 7   ?   ? ?Patients Stated Pain Goal: 0 (02/09/22 2156) ? ?Complications: No notable events documented. ?

## 2022-02-11 NOTE — Telephone Encounter (Signed)
Called and left a message for call back  

## 2022-02-11 NOTE — Op Note (Signed)
Preoperative diagnosis:  ?Left UPJ calculus with hydronephrosis ?Bacteremia-undetermined etiology ?Right ureteral calculus ? ?Postoperative diagnosis:  ?Left UPJ calculus with hydronephrosis ?Pyonephrosis left kidney ?Right ureteral calculus ? ?Procedure: ?Cystoscopy ?Placement left ureteral stent ?Exchange right ureteral stent ?Bilateral retrograde pyelograms ? ?Surgeon: Riki Altes, MD ? ?Anesthesia: General ? ?Complications: None ? ?Intraoperative findings:  ?Thick, purulent urine aspirated left renal pelvis ?Left retrograde pyelogram with moderate hydronephrosis ?Right retrograde pyelogram with small renal pelvis and no hydronephrosis ? ?EBL: Minimal ? ?Specimens:  ?Urine culture and Gram stain urine left renal pelvis ?Urine culture right renal pelvic urine ? ?Indication: Kim Graham is a 71 y.o. with a history of acute cholecystitis and right ureteral calculus status post cholecystectomy and placement right ureteral stent 12/09/2021.  Recently admitted with weakness.  Blood culture positive for gram positive cocci.  Patient febrile with temps up to 103.  Found on CT to have 16 mm obstructing left UPJ calculus.  Stent placement recommended along with exchange of her right ureteral stent since it has been indwelling for 8 weeks after reviewing the management options for treatment, she elected to proceed with the above surgical procedure(s). We have discussed the potential benefits and risks of the procedure, side effects of the proposed treatment, the likelihood of the patient achieving the goals of the procedure, and any potential problems that might occur during the procedure or recuperation. Informed consent has been obtained. ? ?Description of procedure: ? ?The patient was taken to the operating room and general anesthesia was induced.  The patient was placed in the dorsal lithotomy position, prepped and draped in the usual sterile fashion.  She has been on IV antibiotics since admission.  A preoperative  time-out was performed.  ? ?A 21 French cystoscope was duplicated and passed per urethra.  Panendoscopy was performed and bladder mucosa without solid or papillary lesions.  Inflammatory changes present bladder base secondary to indwelling right ureteral stent. ? ?Attention was directed to the left ureteral orifice and a 0.038 Sensor wire was advanced up the left ureter under fluoroscopic guidance.  The left UPJ calculus was visualized on fluoroscopy.  Some resistance in advancing the wire beyond the calculus which was subsequently successful. ? ?A 6 [place] open-ended ureteral catheter was then advanced over the guidewire proximal to the calculus.  The guidewire was removed and 10 cc of thick, purulent urine was aspirated and sent for culture/Gram stain.  Retrograde pyelogram was then performed with findings as described above. ? ?The guidewire was replaced and the ureteral catheter was removed.  A 45F/24 cm Contour ureteral stent was then advanced over the wire with the proximal curl positioned in a dilated upper pole calyx.  The distal end stent was well positioned in direct vision.  Purulent urine was noted effluxing the UO after wire and stent placement. ? ?The right ureteral stent was then grasped with endoscopic forceps and brought out to the urethral meatus.  The Sensor wire was then advanced through the stent into the renal pelvis without difficulty.  The 6 [place] open-ended catheter was advanced over the wire followed by guidewire removal and retrograde pyelogram with findings as described above. ?The guidewire was replaced and the ureteral catheter was removed.  Due to a small renal pelvis and adequate stent curl could not be obtained with a 45F/24 cm Contour stent.  A 4.5F/22 cm Percuflex Plus ureteral stent was able to be placed with a good curl seen in the renal pelvis and bladder. ? ?A Foley catheter  was placed to maximize drainage of the left collecting system. ? ?After anesthetic reversal the patient  was transported to the PACU in stable condition. ? ?Plan: ?Continue antibiotic therapy per primary team and ID ?2.  Foley catheter drainage until clinical improvement seen ? ? ?Riki Altes, M.D. ? ?

## 2022-02-11 NOTE — Anesthesia Preprocedure Evaluation (Addendum)
Anesthesia Evaluation  ?Patient identified by MRN, date of birth, ID band ?Patient awake ? ? ? ?Reviewed: ?Allergy & Precautions, NPO status , Patient's Chart, lab work & pertinent test results ? ?History of Anesthesia Complications ?Negative for: history of anesthetic complications ? ?Airway ?Mallampati: III ? ?TM Distance: >3 FB ?Neck ROM: full ? ? ? Dental ? ?(+) Missing, Poor Dentition, Chipped ?  ?Pulmonary ?COPD, Current Smoker,  ?  ?Pulmonary exam normal ? ? ? ? ? ? ? Cardiovascular ?(-) angina(-) Past MI Normal cardiovascular exam ? ? ?  ?Neuro/Psych ?PSYCHIATRIC DISORDERS negative neurological ROS ?   ? GI/Hepatic ?negative GI ROS, (+) Hepatitis -, C  ?Endo/Other  ?diabetes, Type 2 ? Renal/GU ?ARF and CRFRenal disease  ? ?  ?Musculoskeletal ? ? Abdominal ?  ?Peds ? Hematology ?negative hematology ROS ?(+)   ?Anesthesia Other Findings ?Past Medical History: ?No date: COPD (chronic obstructive pulmonary disease) (HCC) ?04/15/2018: Diabetes mellitus type 2, diet-controlled (HCC) ? ?Past Surgical History: ?12/09/2021: CYSTOSCOPY W/ URETERAL STENT PLACEMENT; Right ?    Comment:  Procedure: CYSTOSCOPY WITH RETROGRADE PYELOGRAM/URETERAL ?             STENT PLACEMENT;  Surgeon: Riki Altes, MD;   ?             Location: [facility] ORS;  Service: Urology;  Laterality:  ?             Right; ?12/11/2021: ENDOSCOPIC RETROGRADE CHOLANGIOPANCREATOGRAPHY (ERCP) WITH  ?PROPOFOL; N/A ?    Comment:  Procedure: ENDOSCOPIC RETROGRADE  ?             CHOLANGIOPANCREATOGRAPHY (ERCP) WITH PROPOFOL;  Surgeon:  ?             [name], MD;  Location: [facility] ENDOSCOPY;  Service:  ?             Endoscopy;  Laterality: N/A; ? ?BMI   ? Body Mass Index: 18.02 kg/m?  ?  ? ? Reproductive/Obstetrics ?negative OB ROS ? ?  ? ? ? ? ? ? ? ? ? ? ? ? ? ?  ?  ? ? ? ? ? ? ? ?Anesthesia Physical ?Anesthesia Plan ? ?ASA: 3 ? ?Anesthesia Plan: General LMA  ? ?Post-op Pain Management:   ? ?Induction: Intravenous ? ?PONV  Risk Score and Plan: Dexamethasone, Ondansetron, Midazolam and Treatment may vary due to age or medical condition ? ?Airway Management Planned: LMA ? ?Additional Equipment:  ? ?Intra-op Plan:  ? ?Post-operative Plan: Extubation in OR ? ?Informed Consent: I have reviewed the patients History and Physical, chart, labs and discussed the procedure including the risks, benefits and alternatives for the proposed anesthesia with the patient or authorized representative who has indicated his/her understanding and acceptance.  ? ? ? ?Dental Advisory Given ? ?Plan Discussed with: Anesthesiologist, CRNA and Surgeon ? ?Anesthesia Plan Comments: (Patient consented for risks of anesthesia including but not limited to:  ?- adverse reactions to medications ?- damage to eyes, teeth, lips or other oral mucosa ?- nerve damage due to positioning  ?- sore throat or hoarseness ?- Damage to heart, brain, nerves, lungs, other parts of body or loss of life ? ?Patient voiced understanding.)  ? ? ? ? ? ? ?Anesthesia Quick Evaluation ? ?

## 2022-02-11 NOTE — Anesthesia Procedure Notes (Signed)
Procedure Name: LMA Insertion ?Date/Time: 02/11/2022 11:23 AM ?Performed by: Jerrye Noble, CRNA ?Pre-anesthesia Checklist: Patient identified, Emergency Drugs available, Suction available and Patient being monitored ?Patient Re-evaluated:Patient Re-evaluated prior to induction ?Oxygen Delivery Method: Circle system utilized ?Preoxygenation: Pre-oxygenation with 100% oxygen ?Induction Type: IV induction ?Ventilation: Mask ventilation without difficulty ?LMA: LMA inserted ?LMA Size: 3.0 ?Placement Confirmation: positive ETCO2 and breath sounds checked- equal and bilateral ?Tube secured with: Tape ?Dental Injury: Teeth and Oropharynx as per pre-operative assessment  ? ? ? ? ?

## 2022-02-11 NOTE — Telephone Encounter (Signed)
-----   Message from [name], MD sent at 02/09/2022 12:39 PM EDT ----- ?Please let the patient know the fibrosis has decreased greatly since the last test. ?

## 2022-02-11 NOTE — Telephone Encounter (Signed)
Called and left a message mailed letter  ?

## 2022-02-11 NOTE — Anesthesia Postprocedure Evaluation (Signed)
Anesthesia Post Note ? ?Patient: Kim Graham ? ?Procedure(s) Performed: CYSTOSCOPY/URETEROSCOPY/HOLMIUM LASER/STENT PLACEMENT & RIGHT URETERAL STENT EXCHANGE (Bilateral: Urethra) ? ?Patient location during evaluation: PACU ?Anesthesia Type: General ?Level of consciousness: awake and alert ?Pain management: pain level controlled ?Vital Signs Assessment: post-procedure vital signs reviewed and stable ?Respiratory status: spontaneous breathing, nonlabored ventilation, respiratory function stable and patient connected to nasal cannula oxygen ?Cardiovascular status: blood pressure returned to baseline and stable ?Postop Assessment: no apparent nausea or vomiting ?Anesthetic complications: no ? ? ?No notable events documented. ? ? ?Last Vitals:  ?Vitals:  ? 02/11/22 1315 02/11/22 1333  ?BP: (!) 145/54 (!) 141/60  ?Pulse: 100 100  ?Resp: 15   ?Temp: 37.6 ?C 37.6 ?C  ?SpO2: 94% 91%  ?  ?Last Pain:  ?Vitals:  ? 02/11/22 1333  ?TempSrc: Oral  ?PainSc:   ? ? ?  ?  ?  ?  ?  ?  ? ?Cleda Mccreedy Charleene Callegari ? ? ? ? ?

## 2022-02-11 NOTE — TOC Progression Note (Signed)
Transition of Care (TOC) - Progression Note  ? ? ?Patient Details  ?Name: Kim Graham ?MRN: [id number] ?Date of Birth: 15-Feb-1951 ? ?Transition of Care (TOC) CM/SW Contact  ?Conception Oms, RN ?Phone Number: ?02/11/2022, 9:58 AM ? ?Clinical Narrative:  Met with the patient in the room, she is awake alert and oriented to Place and person as well as situation, I reviewed the bed offers with her for STR, she chose to go to [facility],   I notified Ricky at [employer] and will start Ins approval process once medically ready ? ? ? ?  ?  ? ?Expected Discharge Plan and Services ?  ?  ?  ?  ?  ?                ?  ?  ?  ?  ?  ?  ?  ?  ?  ?  ? ? ?Social Determinants of Health (SDOH) Interventions ?  ? ?Readmission Risk Interventions ?   ? View : No data to display.  ?  ?  ?  ? ? ?

## 2022-02-11 NOTE — Progress Notes (Signed)
? ?Date of Admission:  02/07/2022    ? ?ID: Kim Graham is a 71 y.o. female  ?Principal Problem: ?  UTI (urinary tract infection) ?Active Problems: ?  Chronic obstructive pulmonary disease ([place]) ?  Tobacco abuse ?  Depression, recurrent ([place]) ?  Diabetes mellitus type 2, diet-controlled ([place]) ?  Methadone dependence ([place]) ?  Acute metabolic encephalopathy ?  AKI (acute kidney injury) ([place]) ?  Transaminitis ?  Sepsis with acute renal failure ([place]) ?  Ureteral calculus, left ?  Hydronephrosis of left kidney ?  Nephrolithiasis ? ?Kim Graham is a 71 y.o. with a history of DM, COPD presents with weakness and altered mental status of 1 day duration.  ?Pt was in the hospital 12/09/21-12/13/21  with pain abdomen and found to have gall stones and Dilatation of CBD- she also had rt ureteric stone obstruction- she underwent cystoscopy with rt ureteric stent placement by Dr.Stoioff and  robotic laparoscopic partial cholecystectomy robotic on 12/09/21.  ?There was biliary leak for which she underwent ERCP on 12/11/21  and as The entire main bile duct was dilated,a biliary sphincterotomy was performed.and  One stent was placed into the common bile duct. ?She was discharged home on 2/4- she has been feeling unwell intermittently since then- some days she had pain abdomen and felt weak. Other days she was fine- She saw GI Dr.Wohl on 02/04/22 and she was doing okay and he planned to get the stent out in 1 month ?She was brought to the ED by her firend on 4/1 as she found her weak, confused and poor appetite ?In the ED vitals BP 112/63, HR 76, temp 98.6 ?WBC 11.4, Hb 12.4, cr 1.26. CT head no acute findings- blood culture sent  ?CT abdomen showed rt double J stent without rt hydro, moderate left hydro with stone at the left UPJ, nodular liver and pneumobilia ? ?Subjective: ?Pt is doing much better ?Says she is not as weak ?Underwent cystoscopy and stenting of left ureter- purulent urine drained ? ? ?Medications:  ? [MAR Hold]  ARIPiprazole  20 mg Oral Daily  ? [MAR Hold] ascorbic acid  500 mg Oral Daily  ? [MAR Hold] enoxaparin (LOVENOX) injection  40 mg Subcutaneous Q24H  ? [MAR Hold] feeding supplement  237 mL Oral BID BM  ? [MAR Hold] fluconazole  100 mg Oral Daily  ? ipratropium-albuterol      ? [MAR Hold] multivitamin with minerals  1 tablet Oral Daily  ? [MAR Hold] polyethylene glycol  17 g Oral Daily  ? [MAR Hold] senna-docusate  1 tablet Oral BID  ? [MAR Hold] umeclidinium-vilanterol  1 puff Inhalation Q0600  ? ? ?Objective: ?Vital signs in last 24 hours: ?Temp:  [97.7 ?F (36.5 ?C)-101.5 ?F (38.6 ?C)] 99.8 ?F (37.7 ?C) (04/05 1245) ?Pulse Rate:  [63-118] 118 (04/05 1245) ?Resp:  [16-32] 32 (04/05 1245) ?BP: (105-171)/(51-98) 168/98 (04/05 1245) ?SpO2:  [91 %-98 %] 95 % (04/05 1245) ? ?PHYSICAL EXAM:  ?General: Alert, cooperative, no distress, chronically ill ?Head: Normocephalic, without obvious abnormality, atraumatic. ?Eyes: Conjunctivae clear, anicteric sclerae. Pupils are equal ?ENT Nares normal. No drainage or sinus tenderness. ?Lips, mucosa, and tongue normal. No Thrush ?Neck: Supple, symmetrical, no adenopathy, thyroid: non tender ?no carotid bruit and no JVD. ?Back: No CVA tenderness. ?Lungs: Clear to auscultation bilaterally. No Wheezing or Rhonchi. No rales. ?Heart: Regular rate and rhythm, no murmur, rub or gallop. ?Abdomen: Soft, non-tender,not distended. Bowel sounds normal. No masses ?Extremities: atraumatic, no cyanosis. No edema.  No clubbing ?Skin: No rashes or lesions. Or bruising ?Lymph: Cervical, supraclavicular normal. ?Neurologic: Grossly non-focal ? ?Lab Results ?Recent Labs  ?  02/09/22 ?0357 02/10/22 ?0431 02/11/22 ?0414  ?WBC 11.5* 10.5  --   ?HGB 10.6* 10.0*  --   ?HCT 34.0* 32.7*  --   ?NA 145 142 143  ?K 3.5 3.4* 3.3*  ?CL 111 112* 112*  ?CO2 23 22 24   ?BUN 26* 21 19  ?CREATININE 1.30* 1.28* 1.37*  ? ?Liver Panel ?Recent Labs  ?  02/10/22 ?0431 02/11/22 ?0414  ?PROT 5.6* 5.8*  ?ALBUMIN 2.1* 2.1*  ?AST  147* 54*  ?ALT 153* 104*  ?ALKPHOS 136* 117  ?BILITOT 0.7 0.5  ? ?Sedimentation Rate ?No results for input(s): ESRSEDRATE in the last 72 hours. ?C-Reactive Protein ?No results for input(s): CRP in the last 72 hours. ? ?Microbiology: ? ?Studies/Results: ?DG Chest Port 1 View ? ?Result Date: 02/09/2022 ?CLINICAL DATA:  Fever, COPD EXAM: PORTABLE CHEST 1 VIEW COMPARISON:  02/07/2022 FINDINGS: There is diffuse interstitial thickening, new since prior examination which may relate to diffuse diffuse trace interstitial pulmonary edema or inflammatory infiltrate. More focal pulmonary infiltrate is seen within the retrocardiac region, likely infectious in the appropriate clinical setting. No pneumothorax or pleural effusion. Cardiac size within normal limits. Remote fracture deformity within the right humeral head. No acute bone abnormality. IMPRESSION: Focal pulmonary infiltrate within the retrocardiac region, likely infectious in the appropriate clinical setting. Superimposed diffuse interstitial pulmonary infiltrate may reflect trace interstitial pulmonary edema or diffuse inflammatory infiltrate. Electronically Signed   By: Fidela Salisbury M.D.   On: 02/09/2022 20:45  ? ?DG OR UROLOGY CYSTO IMAGE ([place]) ? ?Result Date: 02/11/2022 ?There is no interpretation for this exam.  This order is for images obtained during a surgical procedure.  Please See "Surgeries" Tab for more information regarding the procedure.  ? ?US ABDOMEN LIMITED RUQ (LIVER/GB) ? ?Result Date: 02/10/2022 ?CLINICAL DATA:  Elevated liver function tests. Cholecystectomy. Bile duct stent placed February 2023. Hepatitis-C. EXAM: ULTRASOUND ABDOMEN LIMITED RIGHT UPPER QUADRANT COMPARISON:  02/08/2022 unenhanced CT abdomen/pelvis FINDINGS: Gallbladder: Surgically absent. Common bile duct: Diameter: 5 mm.  Stent visualized within the common bile duct. Liver: Diffusely coarsened liver parenchymal echotexture with mildly irregular liver surface, compatible with  cirrhosis. No liver masses. Portal vein is patent on color Doppler imaging with normal direction of blood flow towards the liver. Other: None. IMPRESSION: 1. Cirrhosis.  No liver masses. 2. Status post cholecystectomy. Stent visualized within normal caliber common bile duct. Electronically Signed   By: Ilona Sorrel M.D.   On: 02/10/2022 08:11   ? ? ?Assessment/Plan: ?Bacteremia- likely anerobic bacteria- ID still pending ?Continue zosyn as patient improving ? ?Left hydronephrosis/pyonephrosis due to PUJ obstruction s/p stent ?Urine culture sent from OR ? ?Recent cholecystectomy, bikliary leak, CBD dilatation, stones, stent in CBD ? ?AKI ? ?DM ? ?Discussed the management with patient and care team ? ? ?  ?

## 2022-02-11 NOTE — Progress Notes (Signed)
Speech Language Pathology Treatment: Dysphagia  ?Patient Details ?Name: Kim Graham ?MRN: [id number] ?DOB: 04/23/1951 ?Today's Date: 02/11/2022 ?Time: 1600-1650 ?SLP Time Calculation (min) (ACUTE ONLY): 50 min ? ?Assessment / Plan / Recommendation ?Clinical Impression ? Pt seen for ongoing assessment of swallowing. Kim Graham appears more alert and verbally responsive and engaged in conversation w/ SLP than at evaluation yesterday; pt stated Kim Graham felt "better" today. Kim Graham is on [place] O2 support 2L; wbc wnl. Unable to wear Upper Denture plate d/t ill-fit. ?Pt had surgical procedure earlier today addressing "gallstones" per pt/NSG.  ? ?Pt explained general aspiration precautions and agreed verbally to the need for following them especially sitting upright for all oral intake -- supported behind the back for full upright sitting. Pt assisted w/ positioning d/t generalized weakness. Kim Graham was able to feed herself trials of thin liquids via cup/Straw and purees; preferred Straw use. Inconsistent, overt clinical s/s of aspiration were noted w/ the trials of thin liquids despite education and instruction to follow aspiration precautions (SINGLE SMALL SIPS SLOWLY). Overt coughing noted x2/10+ trials. Respiratory status remained calm and unlabored w/ no decline during/post all oral intake, vocal quality clear b/t trials. Oral phase appeared [facility] for bolus management of trials given w/ timely A-P transfer for swallowing; oral clearing achieved w/ all consistencies. NSG present.  ?  ?In setting of illness/weakness and current hospitalization, pt appears at risk for aspiration. This risk can be reduced when following general aspiration precautions as educated/instructed on.  ?Recommend a Pureed w/ gravies added to moisten foods(d/t inability to wear Upper Denture plate currently); Thin liquids - monitor straw use and move to Cup drinking if indicated to reduce risk for aspiration. Recommend general aspiration precautions; Pills Whole vs Crushed  in Puree; tray setup and positioning assistance for meals. Reduce distractions during oral intake. ST services will follow pt's status during hospitalization for need for objective swallow assessment(MBSS). NSG updated. Precautions posted at bedside.  ? ? ? ? ?  ?HPI HPI: Pt is a 71 y.o. female with medical history significant for nicotine dependence, etoh and substance use, COPD, type 2 diabetes mellitus well controlled, history of hepatitis C status posttreatment who presents to the ER via private vehicle for evaluation of feeling unwell.  Kim Graham was brought into the ER by her friend who noted that Kim Graham been confused for about 2 days and very weak and unable to get around like Kim Graham usually would.  Patient states that Kim Graham feels "bad".  Kim Graham complains of abdominal pain mostly around the periumbilical area.  Admitted w/ AKI, UTI, methadone use baseline.  Recently had cholecystectomy on 12/23/2021 after having developed acute cholecystitis  - also had ERCP with CBD stent placement prior to CCY.  GI consult for possible need of MRCP or further workup; noted cirrhotic appearing liver.  CT abdomen on admission showed left UPG stone with mild to moderate hydronephrosis, patent right ureteral stent  -- urology consultation ongoing.  Patient developed fever of 102 overnight appears to have UTI, +/- mild pneumonia per ua/cxr overnight; MD requested BSE. ?  ?   ?SLP Plan ? Continue with current plan of care ? ?  ?  ?Recommendations for follow up therapy are one component of a multi-disciplinary discharge planning process, led by the attending physician.  Recommendations may be updated based on patient status, additional functional criteria and insurance authorization. ?  ? ?Recommendations  ?Diet recommendations: Dysphagia 1 (puree);Thin liquid ?Liquids provided via: Cup;Straw ?Medication Administration: Whole meds with puree (vs Crushed in puree) ?  Supervision: Patient able to [place] feed;Staff to assist with [place] feeding;Intermittent  supervision to cue for compensatory strategies ?Compensations: Minimize environmental distractions;Slow rate;Small sips/bites;Lingual sweep for clearance of pocketing ?Postural Changes and/or Swallow Maneuvers: Out of bed for meals;Seated upright 90 degrees;Upright 30-60 min after meal  ?   ?    ?   ? ? ? ? General recommendations:  (Dietician f/u) ?Oral Care Recommendations: Oral care BID;Oral care before and after PO;Staff/trained caregiver to provide oral care ?Follow Up Recommendations:  (TBD) ?Assistance recommended at discharge: Intermittent Supervision/Assistance ?SLP Visit Diagnosis: Dysphagia, oropharyngeal phase (R13.12) ?Plan: Continue with current plan of care ? ? ? ? ?  ?  ? ? ? ? ?Jerilynn Som, MS, CCC-SLP ?Speech Language Pathologist ?Rehab Services; [facility] [place] ?[phone] (ascom) ? ?Isiaha Greenup ? ?02/11/2022, 6:11 PM ?

## 2022-02-11 NOTE — Plan of Care (Signed)

## 2022-02-12 DIAGNOSIS — N390 Urinary tract infection, site not specified: Secondary | ICD-10-CM | POA: Diagnosis not present

## 2022-02-12 DIAGNOSIS — N1330 Unspecified hydronephrosis: Secondary | ICD-10-CM | POA: Diagnosis not present

## 2022-02-12 DIAGNOSIS — N136 Pyonephrosis: Secondary | ICD-10-CM | POA: Diagnosis not present

## 2022-02-12 DIAGNOSIS — N201 Calculus of ureter: Secondary | ICD-10-CM | POA: Diagnosis not present

## 2022-02-12 DIAGNOSIS — R7401 Elevation of levels of liver transaminase levels: Secondary | ICD-10-CM | POA: Diagnosis not present

## 2022-02-12 DIAGNOSIS — A419 Sepsis, unspecified organism: Secondary | ICD-10-CM | POA: Diagnosis not present

## 2022-02-12 LAB — COMPREHENSIVE METABOLIC PANEL
ALT: 72 U/L — ABNORMAL HIGH (ref 0–44)
AST: 37 U/L (ref 15–41)
Albumin: 2.2 g/dL — ABNORMAL LOW (ref 3.5–5.0)
Alkaline Phosphatase: 105 U/L (ref 38–126)
Anion gap: 10 (ref 5–15)
BUN: 18 mg/dL (ref 8–23)
CO2: 25 mmol/L (ref 22–32)
Calcium: 8.2 mg/dL — ABNORMAL LOW (ref 8.9–10.3)
Chloride: 107 mmol/L (ref 98–111)
Creatinine, Ser: 1.24 mg/dL — ABNORMAL HIGH (ref 0.44–1.00)
GFR, Estimated: 47 mL/min — ABNORMAL LOW (ref >60)
Glucose, Bld: 149 mg/dL — ABNORMAL HIGH (ref 70–99)
Potassium: 3.1 mmol/L — ABNORMAL LOW (ref 3.5–5.1)
Sodium: 142 mmol/L (ref 135–145)
Total Bilirubin: 0.5 mg/dL (ref 0.3–1.2)
Total Protein: 5.9 g/dL — ABNORMAL LOW (ref 6.5–8.1)

## 2022-02-12 LAB — GLUCOSE, CAPILLARY
Glucose-Capillary: 143 mg/dL — ABNORMAL HIGH (ref 70–99)
Glucose-Capillary: 156 mg/dL — ABNORMAL HIGH (ref 70–99)
Glucose-Capillary: 147 mg/dL — ABNORMAL HIGH (ref 70–99)
Glucose-Capillary: 162 mg/dL — ABNORMAL HIGH (ref 70–99)

## 2022-02-12 MED ORDER — SODIUM CHLORIDE 0.9 % IV SOLN
3.0000 g | Freq: Four times a day (QID) | INTRAVENOUS | Status: DC
  Administered 2022-02-12 – 2022-02-13 (×3): 3 g via INTRAVENOUS
  Filled 2022-02-12 (×2): qty 8
  Filled 2022-02-12: qty 3
  Filled 2022-02-12: qty 8

## 2022-02-12 NOTE — Progress Notes (Signed)
[place] ? ? ?PATIENT NAME: Kim Graham   ? ?MR#:  [id number] ? ?DATE OF BIRTH:  [birth year] ? ?SUBJECTIVE:  ?eating breakfast. Not liking the pur?ed diet. Tolerating grades. No family at bedside. ?Patient is status post left ureteral stent placement ?VITALS:  ?Blood pressure 121/60, pulse 65, temperature 98.4 ?F (36.9 ?C), resp. rate 18, height 5\' 4"  (1.626 m), weight 47.6 kg, SpO2 96 %. ? ?PHYSICAL EXAMINATION:  ? ?GENERAL:  71 y.o.-year-old patient lying in the bed with no acute distress. Looks very deconditioned and weak ?LUNGS: decreased breath sounds bilaterally, no wheezing, rales, rhonchi. No respiratory distress ?CARDIOVASCULAR: S1, S2 normal. No murmurs, rubs, or gallops.  ?ABDOMEN: Soft, nontender, nondistended.  ?EXTREMITIES: No  edema b/l.    ?NEUROLOGIC: nonfocal  patient is alert and awake ? ?LABORATORY PANEL:  ?CBC ?Recent Labs  ?Lab 02/10/22 ?0431  ?WBC 10.5  ?HGB 10.0*  ?HCT 32.7*  ?PLT 126*  ? ? ? ?Chemistries  ?Recent Labs  ?Lab 02/10/22 ?0431 02/11/22 ?0414 02/12/22 ?0442  ?NA 142   < > 142  ?K 3.4*   < > 3.1*  ?CL 112*   < > 107  ?CO2 22   < > 25  ?GLUCOSE 154*   < > 149*  ?BUN 21   < > 18  ?CREATININE 1.28*   < > 1.24*  ?CALCIUM 8.1*   < > 8.2*  ?MG 2.1  --   --   ?AST 147*   < > 37  ?ALT 153*   < > 72*  ?ALKPHOS 136*   < > 105  ?BILITOT 0.7   < > 0.5  ? < > = values in this interval not displayed.  ? ? ?Cardiac Enzymes ?No results for input(s): TROPONINI in the last 168 hours. ?RADIOLOGY:  ?DG OR UROLOGY CYSTO IMAGE ([place]) ? ?Result Date: 02/11/2022 ?There is no interpretation for this exam.  This order is for images obtained during a surgical procedure.  Please See "Surgeries" Tab for more information regarding the procedure.   ? ?Assessment and Plan ? ?Kim Graham is a 71 yo female with PMH chronic pain with methadone dependence, COPD, DM II, history of HCV s/p treatment who presented to the hospital with confusion.  She had endorsed some urinary  symptoms on admission including increased frequency but denied dysuria or hematuria ? ?Gram-positive rod Sepsis  ?--Source urine vs GI (CBD stent) vs Right LL pneumonia ?aspiration ?-- patient currently on IV Zosyn ?-- white count normal ?-- 4/4--fever of 103, tachycardia, abnormal UA ?-- infectious disease consultation with Dr. Steva Graham ?--4/5--no fever ?--4/6 overall looks better . ?right lower lobe pneumonia (CXR 02/09/22) ?COPD/tobacco abuse ?-- NPO for now ?-- speech therapy to see patient eval swallow/rule out aspiration ?-- sats 95 to 96% on room air ?-- continue IV Zosyn ?-- advised smoking cessation ?-- PRN inhalers nebs ? ?bilateral ureteral stones status post right ureteral stent placement in January 2023 ?-- CT abdomen on admission showed left UPG stone with mild to moderate hydronephrosis, patent right ureteral stent ?-- urology consultation with Dr. Bernardo Graham ?-4/5 s/p left ureteral stent placement and exchange of right ureteral stent. ?--Repeat UC (4/5) GPR, GPC ?-- per urology okay to remove Foley. Patient will need definitive treatment of her kidney stones as outpatient ? ?Transaminitis ?-- LFTs elevated on admission ?-- patient recently had Lap cholecystectomy on 12/09/2021 ?--ERCP with CBD stent placement  12/11/21 by Dr Kim Graham ?-- LFTs trending down ?--  patient seen by Dr. Haig Graham from [facility] no indication for CBD stent removal at present ? ?acute metabolic encephalopathy ?-- suspected due to sepsis, chronic methadone use, AK I ?-- mental status improving ? ?Chronic Methadone dependence ?-- pt takes Methadone 100 mg daily last dose 4/1 ?--will hold for now till pt a bit better  ? ?AKI appears prerenal azotemia in the setting of sepsis ?Mild hypokalemia ?--Baseline creat 0.6--0.9 ?-- came in with creatinine of 1.26 ?-- continue IV fluids ? ?Depression ?-- continue Abilify ? ?PT recommends rehab. TOC for d/c planning ? ? ?Procedures: left ureteral stent placement and exchange of right ureteral  stent ?Family communication : friend Kim Graham 4/6 ?Consults : urology, [facility] ID ?CODE STATUS: full ?DVT Prophylaxis : enoxaparin ?Level of care: Med-Surg ?Status is: Inpatient ?Remains inpatient appropriate because: ongoing sepsis workup ? ?anticipate discharge 1-3 days to rehab ? ?TOTAL TIME TAKING CARE OF THIS PATIENT: 5minutes.  ?>50% time spent on counselling and coordination of care ? ?Note: This dictation was prepared with Dragon dictation along with smaller phrase technology. Any transcriptional errors that result from this process are unintentional. ? ?Kim Graham M.D  ? ? ?[facility]  ? ?CC: ?Primary care physician; Kim Roys, DO  ?

## 2022-02-12 NOTE — Progress Notes (Signed)
Physical Therapy Treatment ?Patient Details ?Name: Kim Graham ?MRN: [id number] ?DOB: 25-Feb-1951 ?Today's Date: 02/12/2022 ? ? ?History of Present Illness Kim Graham is a 71 y.o. female with medical history significant for nicotine dependence, COPD, type 2 diabetes mellitus well controlled, history of hepatitis C status posttreatment who presents to the ER via private vehicle for evaluation of feeling unwell. Recent cholecystectomy. MD assessment includes: sepsis, RLL PNA, transaminitis, acute metabolic encephalopathy, and AKI.  Pt now s/p cystoscopy, L ureteral stent placement, R ureteral stent exchange, and bilateral retrograde pyelograms 02/11/22. ? ?  ?PT Comments  ? ? Patient agreeable to PT and is making progress with functional independence. She continues to require assistance with mobility. Min A for standing from bed and to ambulate a short distance in room using rolling walker. She needs cues for safety with mobility efforts. Activity tolerance limited by fatigue. PT will continue to follow to maximize independence and facilitate return to prior level of function. SNF  recommended at discharge.  ?  ?Recommendations for follow up therapy are one component of a multi-disciplinary discharge planning process, led by the attending physician.  Recommendations may be updated based on patient status, additional functional criteria and insurance authorization. ? ?Follow Up Recommendations ? Skilled nursing-short term rehab (<3 hours/day) ?  ?  ?Assistance Recommended at Discharge Frequent or constant Supervision/Assistance  ?Patient can return home with the following A lot of help with walking and/or transfers;A lot of help with bathing/dressing/bathroom;Help with stairs or ramp for entrance;Assistance with cooking/housework;Assist for transportation ?  ?Equipment Recommendations ? Rolling walker (2 wheels)  ?  ?Recommendations for Other Services   ? ? ?  ?Precautions / Restrictions Precautions ?Precautions:  Fall ?Restrictions ?Weight Bearing Restrictions: No  ?  ? ?Mobility ? Bed Mobility ?Overal bed mobility: Needs Assistance ?Bed Mobility: Supine to Sit ?  ?  ?Supine to sit: Mod assist ?  ?  ?General bed mobility comments: assistance for trunk support and intermittent LE support provided. verbal cues for technique ?  ? ?Transfers ?Overall transfer level: Needs assistance ?Equipment used: Rolling walker (2 wheels) ?Transfers: Sit to/from Stand ?Sit to Stand: Min assist ?  ?  ?  ?  ?  ?General transfer comment: verbal cues for hand placement and use of rolling walker ?  ? ?Ambulation/Gait ?Ambulation/Gait assistance: Min assist ?Gait Distance (Feet): 5 Feet ?Assistive device: Rolling walker (2 wheels) ?Gait Pattern/deviations: Decreased stride length, Narrow base of support ?Gait velocity: decreased ?  ?  ?General Gait Details: verbal cues for technique using rolling walker. steadying assistance provided. patient declined attempting to ambulate further at this time ? ? ?Stairs ?  ?  ?  ?  ?  ? ? ?Wheelchair Mobility ?  ? ?Modified Rankin (Stroke Patients Only) ?  ? ? ?  ?Balance   ?Sitting-balance support: Feet unsupported, No upper extremity supported ?Sitting balance-Leahy Scale: Fair ?Sitting balance - Comments: stand by assistance for safety. no loss of balance noted ?  ?Standing balance support: Bilateral upper extremity supported, During functional activity ?Standing balance-Leahy Scale: Fair ?Standing balance comment: Min guard for safety using rolling walker for support in standing ?  ?  ?  ?  ?  ?  ?  ?  ?  ?  ?  ?  ? ?  ?Cognition Arousal/Alertness: Awake/alert ?Behavior During Therapy: [facility] for tasks assessed/performed ?Overall Cognitive Status: Within Functional Limits for tasks assessed ?  ?  ?  ?  ?  ?  ?  ?  ?  ?  ?  ?  ?  ?  ?  ?  ?  General Comments: patient is able to follow commands with increased time. ?  ?  ? ?  ?Exercises   ? ?  ?General Comments   ?  ?  ? ?Pertinent Vitals/Pain Pain Assessment ?Pain  Assessment: No/denies pain  ? ? ?Home Living   ?  ?  ?  ?  ?  ?  ?  ?  ?  ?   ?  ?Prior Function    ?  ?  ?   ? ?PT Goals (current goals can now be found in the care plan section) Acute Rehab PT Goals ?Patient Stated Goal: to feel better ?PT Goal Formulation: With patient ?Time For Goal Achievement: 02/23/22 ?Potential to Achieve Goals: Good ?Progress towards PT goals: Progressing toward goals ? ?  ?Frequency ? ? ? Min 2X/week ? ? ? ?  ?PT Plan Current plan remains appropriate  ? ? ?Co-evaluation   ?  ?  ?  ?  ? ?  ?AM-PAC PT "6 Clicks" Mobility   ?Outcome Measure ? Help needed turning from your back to your side while in a flat bed without using bedrails?: A Little ?Help needed moving from lying on your back to sitting on the side of a flat bed without using bedrails?: A Lot ?Help needed moving to and from a bed to a chair (including a wheelchair)?: A Lot ?Help needed standing up from a chair using your arms (e.g., wheelchair or bedside chair)?: A Lot ?Help needed to walk in hospital room?: A Little ?Help needed climbing 3-5 steps with a railing? : A Lot ?6 Click Score: 14 ? ?  ?End of Session   ?Activity Tolerance: Patient tolerated treatment well ?Patient left: in chair;with call bell/phone within reach;with chair alarm set ?  ?PT Visit Diagnosis: Muscle weakness (generalized) (M62.81);Other abnormalities of gait and mobility (R26.89) ?  ? ? ?Time: 2423-5361 ?PT Time Calculation (min) (ACUTE ONLY): 21 min ? ?Charges:  $Therapeutic Activity: 8-22 mins          ? ?Donna Bernard, PT, MPT ? ? ?[name] ?02/12/2022, 1:50 PM ? ?

## 2022-02-12 NOTE — Progress Notes (Signed)
SLP Cancellation Note ? ?Patient Details ?Name: Kim Graham ?MRN: [id number] ?DOB: Sep 17, 1951 ? ? ?Cancelled treatment:       Reason Eval/Treat Not Completed: Patient declined, no reason specified (did not want to wear upper denture plate. stated for ST to come back "tomorrow".) ?ST will f/u tomorrow w/ trials to upgrade when pt is wearing her Denture plate. NSG updated.  ? ? ? ? ?Jerilynn Som, MS, CCC-SLP ?Speech Language Pathologist ?Rehab Services; [facility] [place] ?[phone] (ascom) ?Jolean Madariaga ?02/12/2022, 5:38 PM ?

## 2022-02-12 NOTE — Plan of Care (Signed)

## 2022-02-12 NOTE — Progress Notes (Signed)
? ?Date of Admission:  02/07/2022    ?ID: Kim Graham is a 71 y.o. female  ?Principal Problem: ?  Urinary tract infection, acute ?Active Problems: ?  Chronic obstructive pulmonary disease (HCC) ?  Tobacco abuse ?  Depression, recurrent (HCC) ?  Diabetes mellitus type 2, diet-controlled (HCC) ?  Methadone dependence (HCC) ?  Acute metabolic encephalopathy ?  AKI (acute kidney injury) (HCC) ?  Transaminitis ?  Sepsis with acute renal failure (HCC) ?  Ureteral calculus, left ?  Hydronephrosis of left kidney ?  Nephrolithiasis ? ? ? ?Subjective: ?Pt has some pain abdomen on the left side ?No fever ? ?Medications:  ? ARIPiprazole  20 mg Oral Daily  ? ascorbic acid  500 mg Oral Daily  ? Chlorhexidine Gluconate Cloth  6 each Topical Daily  ? enoxaparin (LOVENOX) injection  30 mg Subcutaneous Q24H  ? feeding supplement  237 mL Oral BID BM  ? fluconazole  100 mg Oral Daily  ? multivitamin with minerals  1 tablet Oral Daily  ? polyethylene glycol  17 g Oral Daily  ? senna-docusate  1 tablet Oral BID  ? umeclidinium-vilanterol  1 puff Inhalation Q0600  ? ? ?Objective: ?Vital signs in last 24 hours: ?Temp:  [98 ?F (36.7 ?C)-99 ?F (37.2 ?C)] 98.4 ?F (36.9 ?C) (04/06 1138) ?Pulse Rate:  [65-84] 65 (04/06 1138) ?Resp:  [14-18] 18 (04/06 1138) ?BP: (98-128)/(43-61) 121/60 (04/06 1138) ?SpO2:  [93 %-96 %] 96 % (04/06 1138) ? ?PHYSICAL EXAM:  ?General: Alert, cooperative, no distress, chronically ill ?Lungs: Clear to auscultation bilaterally. No Wheezing or Rhonchi. No rales. ?Heart: Regular rate and rhythm, no murmur, rub or gallop. ?Abdomen: Soft, tenderness left side ?Extremities: atraumatic, no cyanosis. No edema. No clubbing ?Skin: No rashes or lesions. Or bruising ?Lymph: Cervical, supraclavicular normal. ?Neurologic: Grossly non-focal ? ?Lab Results ?Recent Labs  ?  02/10/22 ?0431 02/11/22 ?0414 02/12/22 ?0442  ?WBC 10.5  --   --   ?HGB 10.0*  --   --   ?HCT 32.7*  --   --   ?NA 142 143 142  ?K 3.4* 3.3* 3.1*  ?CL 112* 112*  107  ?CO2 22 24 25   ?BUN 21 19 18   ?CREATININE 1.28* 1.37* 1.24*  ? ?Liver Panel ?Recent Labs  ?  02/11/22 ?0414 02/12/22 ?0442  ?PROT 5.8* 5.9*  ?ALBUMIN 2.1* 2.2*  ?AST 54* 37  ?ALT 104* 72*  ?ALKPHOS 117 105  ?BILITOT 0.5 0.5  ? ?Sedimentation Rate ?No results for input(s): ESRSEDRATE in the last 72 hours. ?C-Reactive Protein ?No results for input(s): CRP in the last 72 hours. ? ?Microbiology: ? ? ? ? ?Assessment/Plan: ?Actinomyces bacteremia.  There is a very unusual organism to cause bacteremia.  Need to be concerned about a deep infection.  The urine also has gram-positive rods.  Asked lab to ID it.  If it is a kidney infection secondary to actinomyces she will need a prolonged course of antibiotics.  Initially it will have to be IV.  Currently on Zosyn.  Changed to Unasyn. ? ?Cirrhosis of the liver ? ?Recent gallstones and common bile duct stone with bile duct dilatation leading to cholangitis.  Underwent laparoscopic cholecystectomy.  Complicated by bilious leak.  Stent placement in the common bile duct.  The stent will have to be removed or replaced. ? ?Right-sided double-J internal ureteral stent secondary to stone ? ?Complicated UTI with moderate left hydronephrosis/pyelonephrosis due to 1.6 cm stone at the left UPJ.  Status post cystoscopy and  stent placement ? ?Discussed the management with the patient and care team. ?  ?

## 2022-02-12 NOTE — Progress Notes (Signed)
Urology Inpatient Progress Note ? ?Subjective: ?No acute events overnight.  She is afebrile, VSS. ?Creatinine down today, 1.24.  Urine culture pending, but Gram stain is positive for gram-positive rods and gram-positive cocci.  On antibiotics as below. ?She reports some central abdominal discomfort today.  Foley catheter in place draining amber urine. ? ?Anti-infectives: ?Anti-infectives (From admission, onward)  ? ? Start     Dose/Rate Route Frequency Ordered Stop  ? 02/10/22 1700  piperacillin-tazobactam (ZOSYN) IVPB 3.375 g       ? 3.375 g ?12.5 mL/hr over 240 Minutes Intravenous Every 8 hours 02/10/22 1558    ? 02/10/22 1000  cefTRIAXone (ROCEPHIN) 2 g in sodium chloride 0.9 % 100 mL IVPB  Status:  Discontinued       ? 2 g ?200 mL/hr over 30 Minutes Intravenous Every 24 hours 02/09/22 2146 02/10/22 1558  ? 02/09/22 2200  metroNIDAZOLE (FLAGYL) IVPB 500 mg  Status:  Discontinued       ? 500 mg ?100 mL/hr over 60 Minutes Intravenous 2 times daily 02/09/22 2146 02/10/22 1558  ? 02/08/22 1000  cefTRIAXone (ROCEPHIN) 1 g in sodium chloride 0.9 % 100 mL IVPB  Status:  Discontinued       ? 1 g ?200 mL/hr over 30 Minutes Intravenous Every 24 hours 02/07/22 1047 02/09/22 2146  ? 02/08/22 1000  fluconazole (DIFLUCAN) tablet 100 mg       ? 100 mg Oral Daily 02/08/22 0511 02/15/22 0959  ? 02/07/22 0915  cefTRIAXone (ROCEPHIN) 1 g in sodium chloride 0.9 % 100 mL IVPB       ? 1 g ?200 mL/hr over 30 Minutes Intravenous  Once 02/07/22 0900 02/07/22 0940  ? ?  ? ? ?Current Facility-Administered Medications  ?Medication Dose Route Frequency Provider Last Rate Last Admin  ? acetaminophen (TYLENOL) tablet 650 mg  650 mg Oral Q6H PRN Agbata, Tochukwu, MD   650 mg at 02/10/22 1124  ? Or  ? acetaminophen (TYLENOL) suppository 650 mg  650 mg Rectal Q6H PRN Agbata, Tochukwu, MD   650 mg at 02/10/22 1513  ? albuterol (PROVENTIL) (2.5 MG/3ML) 0.083% nebulizer solution 3 mL  3 mL Nebulization Q6H PRN Agbata, Tochukwu, MD      ?  ARIPiprazole (ABILIFY) tablet 20 mg  20 mg Oral Daily Fritzi Mandes, MD   20 mg at 02/12/22 [id number]  ? ascorbic acid (VITAMIN C) tablet 500 mg  500 mg Oral Daily Agbata, Tochukwu, MD   500 mg at 02/12/22 0838  ? Chlorhexidine Gluconate Cloth 2 % PADS 6 each  6 each Topical Daily Fritzi Mandes, MD      ? dextrose 5 %-0.9 % sodium chloride infusion   Intravenous Continuous Dwyane Dee, MD 100 mL/hr at 02/11/22 1651 New Bag at 02/11/22 1651  ? enoxaparin (LOVENOX) injection 30 mg  30 mg Subcutaneous Q24H Benita Gutter, RPH   30 mg at 02/11/22 2155  ? feeding supplement (ENSURE ENLIVE / ENSURE PLUS) liquid 237 mL  237 mL Oral BID BM Ok Edwards, NP   237 mL at 02/09/22 1505  ? fluconazole (DIFLUCAN) tablet 100 mg  100 mg Oral Daily Sharion Settler, NP   100 mg at 02/12/22 [id number]  ? lactulose (CHRONULAC) 10 GM/15ML solution 20 g  20 g Oral Daily PRN Dwyane Dee, MD      ? multivitamin with minerals tablet 1 tablet  1 tablet Oral Daily Agbata, Tochukwu, MD   1 tablet at 02/12/22 0838  ? ondansetron (ZOFRAN)  tablet 4 mg  4 mg Oral Q6H PRN Agbata, Tochukwu, MD      ? Or  ? ondansetron (ZOFRAN) injection 4 mg  4 mg Intravenous Q6H PRN Agbata, Tochukwu, MD      ? piperacillin-tazobactam (ZOSYN) IVPB 3.375 g  3.375 g Intravenous Q8H Ravishankar, Joellyn Quails, MD 12.5 mL/hr at 02/12/22 0631 3.375 g at 02/12/22 0631  ? polyethylene glycol (MIRALAX / GLYCOLAX) packet 17 g  17 g Oral Daily Dwyane Dee, MD   17 g at 02/12/22 [id number]  ? senna-docusate (Senokot-S) tablet 1 tablet  1 tablet Oral BID Dwyane Dee, MD   1 tablet at 02/12/22 0838  ? umeclidinium-vilanterol (ANORO ELLIPTA) 62.5-25 MCG/ACT 1 puff  1 puff Inhalation Q0600 Agbata, Tochukwu, MD   1 puff at 02/12/22 0628  ? ?Objective: ?Vital signs in last 24 hours: ?Temp:  [98 ?F (36.7 ?C)-100 ?F (37.8 ?C)] 99 ?F (37.2 ?C) (04/06 0759) ?Pulse Rate:  [71-118] 79 (04/06 0759) ?Resp:  [14-32] 18 (04/06 0759) ?BP: (98-171)/(43-98) 119/61 (04/06 0759) ?SpO2:  [91  %-98 %] 94 % (04/06 0759) ? ?Intake/Output from previous day: ?04/05 0701 - 04/06 0700 ?In: 1879.5 [I.V.:1736.3; IV Piggyback:143.2] ?Out: 1100 [Urine:1100] ?Intake/Output this shift: ?Total I/O ?In: -  ?Out: 550 [Urine:550] ? ?Physical Exam ?Vitals and nursing note reviewed.  ?Constitutional:   ?   General: She is not in acute distress. ?   Appearance: She is not ill-appearing, toxic-appearing or diaphoretic.  ?HENT:  ?   Head: Normocephalic and atraumatic.  ?Pulmonary:  ?   Effort: Pulmonary effort is normal. No respiratory distress.  ?Skin: ?   General: Skin is warm and dry.  ?Neurological:  ?   Mental Status: She is alert and oriented to person, place, and time.  ?Psychiatric:     ?   Mood and Affect: Mood normal.     ?   Behavior: Behavior normal.  ? ?Lab Results:  ?Recent Labs  ?  02/10/22 ?0431  ?WBC 10.5  ?HGB 10.0*  ?HCT 32.7*  ?PLT 126*  ? ?BMET ?Recent Labs  ?  02/11/22 ?0414 02/12/22 ?0442  ?NA 143 142  ?K 3.3* 3.1*  ?CL 112* 107  ?CO2 24 25  ?GLUCOSE 147* 149*  ?BUN 19 18  ?CREATININE 1.37* 1.24*  ?CALCIUM 8.3* 8.2*  ? ?ABG ?Recent Labs  ?  02/09/22 ?[id number]  ?PHART 7.45  ?HCO3 23.6  ? ?Assessment & Plan: ?71 year old female with confusion and Actinomyces bacteremia of unknown origin with CT findings of a 16 mm obstructing left UPJ stone now s/p left ureteral stent placement and right ureteral stent exchange with intraoperative findings of left pyonephrosis. ? ?Patient is clinically improving today on empiric antibiotics.  She is tolerating her stents well. ? ?We discussed that she will require definitive stone management in approximately 2 weeks in the form of a bilateral ureteroscopy with laser lithotripsy and stent exchange.  She expressed understanding. ? ?Recommendations: ?-Continue antibiotics and follow cultures.  She will require a total of 14 days of culture appropriate therapy. ?-Flomax 0.4 mg daily and oxybutynin 5 mg every 8 as needed for stent discomfort if needed ?-Outpatient bilateral  ureteroscopy with laser lithotripsy and stent exchange in about 2 weeks ? ?Debroah Loop, PA-C ?02/12/2022  ?

## 2022-02-13 ENCOUNTER — Inpatient Hospital Stay: Payer: Self-pay

## 2022-02-13 ENCOUNTER — Inpatient Hospital Stay: Payer: 59

## 2022-02-13 DIAGNOSIS — A429 Actinomycosis, unspecified: Secondary | ICD-10-CM

## 2022-02-13 DIAGNOSIS — N390 Urinary tract infection, site not specified: Secondary | ICD-10-CM | POA: Diagnosis not present

## 2022-02-13 LAB — BASIC METABOLIC PANEL
Anion gap: 10 (ref 5–15)
BUN: 14 mg/dL (ref 8–23)
CO2: 24 mmol/L (ref 22–32)
Calcium: 7.7 mg/dL — ABNORMAL LOW (ref 8.9–10.3)
Chloride: 106 mmol/L (ref 98–111)
Creatinine, Ser: 0.9 mg/dL (ref 0.44–1.00)
GFR, Estimated: >60 mL/min (ref >60)
Glucose, Bld: 134 mg/dL — ABNORMAL HIGH (ref 70–99)
Potassium: 2.3 mmol/L — CL (ref 3.5–5.1)
Sodium: 140 mmol/L (ref 135–145)

## 2022-02-13 LAB — GLUCOSE, CAPILLARY: Glucose-Capillary: 143 mg/dL — ABNORMAL HIGH (ref 70–99)

## 2022-02-13 LAB — MAGNESIUM: Magnesium: 1.7 mg/dL (ref 1.7–2.4)

## 2022-02-13 LAB — POTASSIUM: Potassium: 3.2 mmol/L — ABNORMAL LOW (ref 3.5–5.1)

## 2022-02-13 MED ORDER — SODIUM CHLORIDE 0.9% FLUSH: 10.0000 mL | INTRAVENOUS | Status: DC | PRN

## 2022-02-13 MED ORDER — METHADONE HCL 10 MG PO TABS
100.0000 mg | ORAL_TABLET | Freq: Every day | ORAL | Status: DC
  Administered 2022-02-13: 100 mg via ORAL
  Filled 2022-02-13: qty 10

## 2022-02-13 MED ORDER — METHADONE HCL 10 MG PO TABS: 100.0000 mg | ORAL_TABLET | Freq: Every day | ORAL | 0 refills | Status: AC

## 2022-02-13 MED ORDER — SENNOSIDES-DOCUSATE SODIUM 8.6-50 MG PO TABS: 1.0000 | ORAL_TABLET | Freq: Two times a day (BID) | ORAL | 0 refills | Status: DC

## 2022-02-13 MED ORDER — FLUCONAZOLE 200 MG PO TABS: 200.0000 mg | ORAL_TABLET | Freq: Every day | ORAL | 0 refills | Status: DC

## 2022-02-13 MED ORDER — POTASSIUM CHLORIDE CRYS ER 20 MEQ PO TBCR
40.0000 meq | EXTENDED_RELEASE_TABLET | Freq: Once | ORAL | Status: AC
  Administered 2022-02-13: 40 meq via ORAL
  Filled 2022-02-13: qty 2

## 2022-02-13 MED ORDER — POTASSIUM CHLORIDE CRYS ER 20 MEQ PO TBCR
20.0000 meq | EXTENDED_RELEASE_TABLET | Freq: Every day | ORAL | 0 refills | Status: DC
Start: 2022-02-13 — End: 2022-04-07

## 2022-02-13 MED ORDER — FLUCONAZOLE 200 MG PO TABS: 200.0000 mg | ORAL_TABLET | Freq: Every day | ORAL | 0 refills | Status: AC

## 2022-02-13 MED ORDER — CEFTRIAXONE IV (FOR PTA / DISCHARGE USE ONLY): 2.0000 g | INTRAVENOUS | 0 refills | Status: DC

## 2022-02-13 MED ORDER — FLUCONAZOLE 100 MG PO TABS
200.0000 mg | ORAL_TABLET | Freq: Every day | ORAL | Status: DC
  Filled 2022-02-13: qty 2

## 2022-02-13 MED ORDER — POTASSIUM CHLORIDE 10 MEQ/100ML IV SOLN
10.0000 meq | INTRAVENOUS | Status: AC
  Administered 2022-02-13 (×4): 10 meq via INTRAVENOUS
  Filled 2022-02-13 (×4): qty 100

## 2022-02-13 MED ORDER — POLYETHYLENE GLYCOL 3350 17 G PO PACK: 17.0000 g | PACK | Freq: Every day | ORAL | 0 refills | Status: DC

## 2022-02-13 MED ORDER — POTASSIUM CHLORIDE CRYS ER 20 MEQ PO TBCR
40.0000 meq | EXTENDED_RELEASE_TABLET | Freq: Once | ORAL | Status: DC
  Filled 2022-02-13: qty 2

## 2022-02-13 MED ORDER — SODIUM CHLORIDE 0.9% FLUSH: 10.0000 mL | Freq: Two times a day (BID) | INTRAVENOUS | Status: DC

## 2022-02-13 NOTE — Discharge Summary (Addendum)
?Physician Discharge Summary ?  ?Patient: Kim Graham MRN: [id number] DOB: 1951/03/26  ?Admit date:     02/07/2022  ?Discharge date: 02/13/22  ?Discharge Physician: Enedina Finner  ? ?PCP: Dorcas Carrow, DO  ? ?Recommendations at discharge:  ? ?follow-up urology Dr. Lonna Cobb in two weeks post stent placement and definitive treatment of kidney stones. ?Follow-up PCP in 1 to 2 weeks ?F/u your clinic where you get methadone in [place] ?Discharge Diagnoses: ?Actinomyces Sepsis  due to Pyonephrosis/?Cholangitis ?Bilateral Kidney stones ? ?Hospital Course: ?Ms. Ruan is a 71 yo female with PMH chronic pain with methadone dependence, COPD, DM II, history of HCV s/p treatment who presented to the hospital with confusion.  She had endorsed some urinary symptoms on admission including increased frequency but denied dysuria or hematuria ?  ?Actinomyces Sepsis  due to Pyonephrosis/ ?--- patient currently on IV Zosyn--changed to IV Unasyn and at discharge will be on IV rocephin as below ?-- white count normal ?-- 4/4--fever of 103, tachycardia, abnormal UA ?-- infectious disease consultation with Dr. Joylene Draft ?--4/5--no fever ?--4/6 overall looks better  ?--4/7--PICC line placement. IV abxs for 4 weeks ?--UC from right renal pelvis--Yeast--Fluconazole for 2 weeks ?--Blood culture positive for Actinomyces Naeslundii ? ?right lower lobe pneumonia (CXR 02/09/22) ?COPD/tobacco abuse ?-- speech therapy to see patient eval swallow--cont dypshagia 2 diet ?-- sats 95 to 96% on room air ?-- advised smoking cessation ?-- PRN inhalers nebs ?  ?bilateral ureteral stones status post right ureteral stent placement in January 2023 and left ureteral stent Arpil 2023 ?-- CT abdomen on admission showed left UPG stone with mild to moderate hydronephrosis, patent right ureteral stent ?-- urology consultation with Dr. Lonna Cobb ?-4/5 s/p left ureteral stent placement and exchange of right ureteral stent. ?--Repeat UC (4/5) GPR, GPC ?-- per urology okay  to remove Foley. Patient will need definitive treatment of her kidney stones as outpatient ?--repeat renal US shows resolution of hydronephrosis ?  ?Transaminitis ?-- LFTs elevated on admission ?-- patient recently had Lap cholecystectomy on 12/09/2021 ?--ERCP with CBD stent placement  12/11/21 by Dr Servando Snare ?-- LFTs trending down ?-- patient seen by Dr. Mia Creek from [facility] no indication for CBD stent removal at present ?  ?acute metabolic encephalopathy ?-- suspected due to sepsis, AK I ?-- mental status improved ?  ?Chronic Methadone dependence ?H/o Substance absue ?-- pt takes Methadone 100 mg daily will resume at discharge given rx for few days since pt is going to rehb. She is will follow up at clinic in [place] ? ?AKI appears prerenal azotemia in the setting of sepsis ?Mild hypokalemia ?--Baseline creat 0.6--0.9 ?-- came in with creatinine of 1.26 ?-- received IVF ?  ?Depression ?-- continue Abilify ?  ?PT recommends rehab. TOC for d/c planning ?  ?  ?Procedures: left ureteral stent placement and exchange of right ureteral stent ?Family communication : friend Sibyl Parr 4/7 ?Consults : urology, [facility] ID ?CODE STATUS: full ?DVT Prophylaxis : enoxaparin ?Level of care: Med-Surg ?Status is: Inpatient ? ?D[facility]c to [facility] ? ?Per Dr Rivka Safer ?Actinomyces bacteremia and pyonephrosis and cholangitis ?Baseline Creatinine 1 ? ?OPAT Orders ?Ceftriaxone 2 grams IV every 24 hours ?Duration: ?30 days ?End Date: ?03/14/22 ?  ?[facility] Care Per Protocol: ?  ?Labs weekly while on IV antibiotics: ?_X_ CBC with differential ?  ?_X_ CMP ?  ?  ?_X_ Please pull PIC at completion of IV antibiotics ?  ?  ?Fax weekly labs to [phone] ?  ?Clinic Follow Up  Appt: 03/05/22 at 11.45am ?  ? ? ?Disposition: Rehabilitation facility ?Diet recommendation:  ?Discharge Diet Orders (From admission, onward)  ? ?  Start     Ordered  ? 02/13/22 0000  Diet - low sodium heart healthy       ? 02/13/22 1304  ? ?  ?  ? ?  ? ?Cardiac  diet ?DISCHARGE MEDICATION: ?Allergies as of 02/13/2022   ?No Known Allergies ?  ? ?  ?Medication List  ?  ? ?STOP taking these medications   ? ?b complex vitamins capsule ?  ?hydrochlorothiazide 25 MG tablet ?Commonly known as: HYDRODIURIL ?  ?Premarin vaginal cream ?Generic drug: conjugated estrogens ?  ?zinc gluconate 50 MG tablet ?  ? ?  ? ?TAKE these medications   ? ?albuterol 108 (90 Base) MCG/ACT inhaler ?Commonly known as: VENTOLIN HFA ?Inhale 2 puffs into the lungs every 6 (six) hours as needed for wheezing or shortness of breath. ?  ?ARIPiprazole 20 MG tablet ?Commonly known as: Abilify ?Take 1 tablet (20 mg total) by mouth daily. ?  ?ascorbic acid 500 MG tablet ?Commonly known as: VITAMIN C ?Take by mouth. ?  ?cefTRIAXone  IVPB ?Commonly known as: ROCEPHIN ?Inject 2 g into the vein daily for 28 days. Indication:  Actinomyces bacteremia, pyonephrosis and cholangitis ?Last Day of Therapy:  03/14/2022 ?Labs - Once weekly:  CBC/D and CMP ?Please pull PIC at completion of IV antibiotics ?Fax weekly labs to [phone] ?Start taking on: February 14, 2022 ?  ?fluconazole 200 MG tablet ?Commonly known as: DIFLUCAN ?Take 1 tablet (200 mg total) by mouth daily for 14 days. ?  ?methadone 10 MG tablet ?Commonly known as: DOLOPHINE ?Take 10 tablets (100 mg total) by mouth daily for 5 days. ?What changed: medication strength ?  ?Multi-Vitamin tablet ?Take 1 tablet by mouth daily. ?  ?polyethylene glycol 17 g packet ?Commonly known as: MIRALAX / GLYCOLAX ?Take 17 g by mouth daily. ?Start taking on: February 14, 2022 ?  ?potassium chloride SA 20 MEQ tablet ?Commonly known as: KLOR-CON M ?Take 1 tablet (20 mEq total) by mouth daily for 4 days. ?  ?senna-docusate 8.6-50 MG tablet ?Commonly known as: Senokot-S ?Take 1 tablet by mouth 2 (two) times daily. ?  ?umeclidinium-vilanterol 62.5-25 MCG/ACT Aepb ?Commonly known as: ANORO ELLIPTA ?Inhale 1 puff into the lungs daily at 6 (six) AM. ?  ? ?  ? ?  ?  ? ? ?  ?Home Infusion  Instuctions  ?(From admission, onward)  ?  ? ? ?  ? ?  Start     Ordered  ? 02/13/22 0000  Home infusion instructions       ?Question:  Instructions  Answer:  Flushing of vascular access device: 0.9% NaCl pre/post medication administration and prn patency; Heparin 100 u/ml, 43ml for implanted ports and Heparin 10u/ml, 41ml for all other central venous catheters.  ? 02/13/22 1304  ? ?  ?  ? ?  ? ? Contact information for follow-up providers   ? ? Riki Altes, MD. Schedule an appointment as soon as possible for a visit in 2 week(s).   ?Specialty: Urology ?Why: for definitive kidney stone placement ?Contact information: ?[address] ?[address] ?[place] [address] ?[phone] ? ? ?  ?  ? ? Dorcas Carrow, DO. Schedule an appointment as soon as possible for a visit in 2 week(s).   ?Specialty: Family Medicine ?Why: hospital f/u ?Contact information: ?[address] ?[place] [address] ?[phone] ? ? ?  ?  ? ?  Lynn Itoavishankar, Jayashree, MD. Go on 03/05/2022.   ?Specialty: Infectious Diseases ?Why: at 11:45 am ?Contact information: ?[address] ?[place] [address] ?[phone] ? ? ?  ?  ? ?  ?  ? ? Contact information for after-discharge care   ? ? Destination   ? ? [facility] .   ?Service: Skilled Nursing ?Contact information: ?[address] ?[place] [id number] ?[phone] ? ?  ?  ? ?  ?  ? ?  ?  ? ?  ? ?Discharge Exam: ?Filed Weights  ? 02/07/22 0723  ?Weight: 47.6 kg  ? ? ? ?Condition at discharge: fair ? ?The results of significant diagnostics from this hospitalization (including imaging, microbiology, ancillary and laboratory) are listed below for reference.  ? ?Imaging Studies: ?CT Head Wo Contrast ? ?Result Date: 02/07/2022 ?CLINICAL DATA:  71 year old female with weakness and altered mental status. EXAM: CT HEAD WITHOUT CONTRAST TECHNIQUE: Contiguous axial images were obtained from the base of the skull through the vertex without intravenous contrast.  RADIATION DOSE REDUCTION: This exam was performed according to the departmental dose-optimization program which includes automated exposure control, adjustment of the mA and/or kV according to patient size and/or use o

## 2022-02-13 NOTE — TOC Progression Note (Signed)
Transition of Care (TOC) - Progression Note  ? ? ?Patient Details  ?Name: Kim Graham ?MRN: [id number] ?Date of Birth: [birth year] ? ?Transition of Care (TOC) CM/SW Contact  ?Conception Oms, RN ?Phone Number: ?02/13/2022, 2:12 PM ? ?Clinical Narrative:   patient going to [facility] room E12, she is 4th in line for EMS transport, she will notify her family ? ? ? ?Expected Discharge Plan: [place] ?Barriers to Discharge: Continued Medical Work up ? ?Expected Discharge Plan and Services ?Expected Discharge Plan: [place] ?  ?Discharge Planning Services: CM Consult ?  ?Living arrangements for the past 2 months: [place] ?Expected Discharge Date: 02/13/22               ?  ?DME Agency: NA ?  ?  ?  ?HH Arranged: NA ?  ?  ?  ?  ? ? ?Social Determinants of Health (SDOH) Interventions ?  ? ?Readmission Risk Interventions ?   ? View : No data to display.  ?  ?  ?  ? ? ?

## 2022-02-13 NOTE — Plan of Care (Signed)

## 2022-02-13 NOTE — Progress Notes (Signed)
Peripherally Inserted Central Catheter Placement ? ?The IV Nurse has discussed with the patient and/or persons authorized to consent for the patient, the purpose of this procedure and the potential benefits and risks involved with this procedure.  The benefits include less needle sticks, lab draws from the catheter, and the patient may be discharged home with the catheter. Risks include, but not limited to, infection, bleeding, blood clot (thrombus formation), and puncture of an artery; nerve damage and irregular heartbeat and possibility to perform a PICC exchange if needed/ordered by physician.  Alternatives to this procedure were also discussed.  Bard Power PICC patient education guide, fact sheet on infection prevention and patient information card has been provided to patient /or left at bedside.   ? ?PICC Placement Documentation  ?PICC Single Lumen 02/13/22 Right Basilic 38 cm 0 cm (Active)  ?Indication for Insertion or Continuance of Line Prolonged intravenous therapies 02/13/22 1400  ?Exposed Catheter (cm) 0 cm 02/13/22 1400  ?Site Assessment Clean, Dry, Intact 02/13/22 1400  ?Line Status Flushed;Saline locked;Blood return noted 02/13/22 1400  ?Dressing Type Securing device;Transparent 02/13/22 1400  ?Dressing Status Antimicrobial disc in place;Clean, Dry, Intact 02/13/22 1400  ?Safety Lock Not Applicable 02/13/22 1400  ?Dressing Intervention New dressing 02/13/22 1400  ?Dressing Change Due 02/20/22 02/13/22 1400  ? ? ? ? ? ?Curt Jews ?02/13/2022, 2:23 PM ? ?

## 2022-02-13 NOTE — TOC Progression Note (Signed)
Transition of Care (TOC) - Progression Note  ? ? ?Patient Details  ?Name: Kim Graham ?MRN: [id number] ?Date of Birth: Mar 15, 1951 ? ?Transition of Care (TOC) CM/SW Contact  ?Conception Oms, RN ?Phone Number: ?02/13/2022, 9:04 AM ? ?Clinical Narrative:   Ins approved, will get PICC today, then [facility], Ins  ? ?AuthDO:[id number].  Reference ID: [id number]. Approved 4/7 - 4/11, next review due 4/11. ? ?Expected Discharge Plan: [place] ?  ? ?Expected Discharge Plan and Services ?Expected Discharge Plan: [place] ?  ?Discharge Planning Services: CM Consult ?  ?Living arrangements for the past 2 months: [place] ?                ?  ?DME Agency: NA ?  ?  ?  ?HH Arranged: NA ?  ?  ?  ?  ? ? ?Social Determinants of Health (SDOH) Interventions ?  ? ?Readmission Risk Interventions ?   ? View : No data to display.  ?  ?  ?  ? ? ?

## 2022-02-13 NOTE — Progress Notes (Signed)
PHARMACY CONSULT NOTE FOR: ? ?OUTPATIENT  PARENTERAL ANTIBIOTIC THERAPY (OPAT) ? ?Indication: Actinomyces bacteremia, pyonephrosis and cholangitis ?Regimen: Ceftriaxone 2gm IV q24h ?End date: 03/14/2022 ? ?IV antibiotic discharge orders are pended. ?To discharging provider:  please sign these orders via discharge navigator,  ?Select New Orders & click on the button choice - Manage This Unsigned Work.  ?  ? ?Thank you for allowing pharmacy to be a part of this patient's care. ? ?Juliette Alcide, PharmD, BCPS, BCIDP ?Work Cell: [phone] ?02/13/2022 10:59 AM ? ? ?

## 2022-02-13 NOTE — Progress Notes (Addendum)
Patient discharged to [facility] to room E12 via EMS with Marlowe Kays and Will. A+Ox4. VSS. PICC Line in place to Right Upper Arm, dressing clean/dry/intact. Report called to [facility] - [phone].  Report given to staff member.  All questions answered. PIV removed, no bleeding, intact. Patient satisfied with overall care at [facility].  ?

## 2022-02-13 NOTE — Progress Notes (Signed)
Speech Language Pathology Treatment: Dysphagia  ?Patient Details ?Name: Kim Graham ?MRN: [id number] ?DOB: 1951/06/03 ?Today's Date: 02/13/2022 ?Time: 9357-0177 ?SLP Time Calculation (min) (ACUTE ONLY): 45 min ? ?Assessment / Plan / Recommendation ?Clinical Impression ? Ptseen for ongoing assessment of swallowing. She appears more alert and verbally responsive today; less fatigued than yesterday PM when she did not want to participate in tx session. She engaged in conversation w/ SLP; few tangential statements. Unable to wear Upper Denture plate d/t ill-fit; has few front lower teeth only. Pt afebrile; on RA today. ?Pt seen for diet consistency upgrade per her request. She stated she knew she could not wear her Denture plate but she could "eat the foods anyway" -- she shared that she has not been wearing the Denture plate to eat prior to this admit. ?  ?Pt explained general aspiration precautions and agreed verbally to the need for following them especially sitting upright for all oral intake -- she needed support midline and behind the back for full upright sitting. Pt assisted w/ positioning d/t generalized weakness. She was able to feed herself trials of thin liquids via Straw majority of time and trials of MINCED foods (diet consistency upgrade). Cough x1 noted w/ the trials of thin liquids -- educated pt on taking SMALL, SINGLE SIPS SLOWLY. No further coughing noted w/ liquids and foods consumed. Respiratory status remained calm and unlabored w/ no decline during/post all oral intake, vocal quality clear b/t trials. Oral phase appeared grossly [facility] for bolus management of the Minced food trials though TIME was required for fully mashing/gumming/prepping the boluses for A-P transfer for swallowing; oral clearing achieved w/ all consistencies.  ? ?Time spent on Education on general aspiration precautions; discussion and examples of food consistencies easiest to gum/mash; food prep and use of condiments to moisten  well; oral care post meals. Pt agreed verbally.  ?  ?In setting of illness/weakness and current hospitalization, pt appears at min risk for aspiration. This risk can be reduced when following general aspiration precautions as educated/instructed on.  ?Recommend a MINCED foods diet (dysphagia level 2) w/ gravies added to moisten foods(d/t inability to wear Upper Denture plate currently); Thin liquids - monitor straw use and move to Cup drinking if indicated to reduce risk for aspiration. Recommend general aspiration precautions; Pills Whole vs Crushed in Puree; tray setup and positioning assistance for meals. Reduce distractions during oral intake. ST services to f/u post d/c for any further tx/assessment including objective swallow assessment(MBSS) per need and MD order. NSG updated. Precautions posted at bedside. ST services will monitor status while admitted. ? ? ? ?  ?HPI HPI: Pt is a 71 y.o. female with medical history significant for nicotine dependence, etoh and substance use, COPD, type 2 diabetes mellitus well controlled, history of hepatitis C status posttreatment who presents to the ER via private vehicle for evaluation of feeling unwell.  She was brought into the ER by her friend who noted that she been confused for about 2 days and very weak and unable to get around like she usually would.  Patient states that she feels "bad".  She complains of abdominal pain mostly around the periumbilical area.  Admitted w/ AKI, UTI, methadone use baseline.  Recently had cholecystectomy on 12/23/2021 after having developed acute cholecystitis  - also had ERCP with CBD stent placement prior to CCY.  GI consult for possible need of MRCP or further workup; noted cirrhotic appearing liver.  CT abdomen on admission showed left UPG stone with  mild to moderate hydronephrosis, patent right ureteral stent  -- urology consultation ongoing.  Patient developed fever of 102 overnight appears to have UTI, +/- mild pneumonia per  ua/cxr overnight; MD requested BSE. ?  ?   ?SLP Plan ? All goals met (monitor status if needed while admitted) ? ?  ?  ?Recommendations for follow up therapy are one component of a multi-disciplinary discharge planning process, led by the attending physician.  Recommendations may be updated based on patient status, additional functional criteria and insurance authorization. ?  ? ?Recommendations  ?Diet recommendations: Dysphagia 2 (fine chop);Thin liquid (added purees) ?Liquids provided via: Cup;Straw (monitor) ?Medication Administration: Whole meds with puree (vs need to Crush for ease) ?Supervision: Patient able to [place] feed;Staff to assist with [place] feeding;Intermittent supervision to cue for compensatory strategies ?Compensations: Minimize environmental distractions;Slow rate;Small sips/bites;Lingual sweep for clearance of pocketing;Multiple dry swallows after each bite/sip;Follow solids with liquid ?Postural Changes and/or Swallow Maneuvers: Out of bed for meals;Seated upright 90 degrees;Upright 30-60 min after meal  ?   ?    ?   ? ? ? ? General recommendations:  (Dietician f/u for support) ?Oral Care Recommendations: Oral care BID;Oral care before and after PO;Staff/trained caregiver to provide oral care (support pt) ?Follow Up Recommendations: Follow physician's recommendations for discharge plan and follow up therapies ?Assistance recommended at discharge: Intermittent Supervision/Assistance ?SLP Visit Diagnosis: Dysphagia, oropharyngeal phase (R13.12) (mostly edentulous status d/t ill-fit of denture plate and missing dentition) ?Plan: All goals met (monitor status if needed while admitted) ? ? ? ? ?  ?  ? ? ? ? ? ?Kim Kenner, MS, CCC-SLP ?Speech Language Pathologist ?[facility]; [place] ?[phone] (ascom) ?Kim Graham ? ?02/13/2022, 4:05 PM ?

## 2022-02-13 NOTE — Progress Notes (Signed)
? ?  Date of Admission:  02/07/2022    ?ID: Kim Graham is a 71 y.o. female  ?Principal Problem: ?  Urinary tract infection, acute ?Active Problems: ?  Chronic obstructive pulmonary disease (HCC) ?  Tobacco abuse ?  Depression, recurrent (HCC) ?  Diabetes mellitus type 2, diet-controlled (HCC) ?  Methadone dependence (HCC) ?  Acute metabolic encephalopathy ?  AKI (acute kidney injury) (HCC) ?  Transaminitis ?  Sepsis with acute renal failure (HCC) ?  Ureteral calculus, left ?  Hydronephrosis of left kidney ?  Nephrolithiasis ? ? ?Objective: ?Vital signs in last 24 hours: ?Temp:  [97.6 ?F (36.4 ?C)-98.4 ?F (36.9 ?C)] 98.2 ?F (36.8 ?C) (04/07 0744) ?Pulse Rate:  [71-75] 74 (04/07 0744) ?Resp:  [14-18] 16 (04/07 0744) ?BP: (119-136)/(54-64) 131/62 (04/07 0744) ?SpO2:  [96 %-99 %] 99 % (04/07 0744) ? ?Lab Results ?Recent Labs  ?  02/12/22 ?0442 02/13/22 ?0336 02/13/22 ?1120  ?NA 142 140  --   ?K 3.1* 2.3* 3.2*  ?CL 107 106  --   ?CO2 25 24  --   ?BUN 18 14  --   ?CREATININE 1.24* 0.90  --   ? ?Liver Panel ?Recent Labs  ?  02/11/22 ?0414 02/12/22 ?0442  ?PROT 5.8* 5.9*  ?ALBUMIN 2.1* 2.2*  ?AST 54* 37  ?ALT 104* 72*  ?ALKPHOS 117 105  ?BILITOT 0.5 0.5  ? ?Sedimentation Rate ?No results for input(s): ESRSEDRATE in the last 72 hours. ?C-Reactive Protein ?No results for input(s): CRP in the last 72 hours. ? ?Microbiology: ? ? ? ? ?Assessment/Plan: ?Actinomyces bacteremia.  There is a very unusual organism to cause bacteremia.  Need to be concerned about a deep infection. - Kidney VS CBD ? The urine also has gram-positive rods.  Asked lab to ID it.  If it is a kidney infection secondary to actinomyces she will need a prolonged course of antibiotics.  Initially it will have to be IV.  Currently on Zosyn.  Changed to Unasyn. ?Since she is getting discharged today will do IV ceftriaxone for 4 weeks followed by PO ? ?Cirrhosis of the liver ? ?Recent gallstones and common bile duct stone with bile duct dilatation leading to  cholangitis.  Underwent laparoscopic cholecystectomy.  Complicated by bilious leak.  Stent placement in the common bile duct.  The stent will have to be removed or replaced. ? ?Right-sided double-J internal ureteral stent secondary to stone ? ?Complicated UTI with moderate left hydronephrosis/pyelonephrosis due to 1.6 cm stone at the left UPJ.  Status post cystoscopy and stent placement ? ?Discussed the management care team. ?Will follow her ap OP ?  ?

## 2022-02-13 NOTE — Treatment Plan (Signed)
Diagnosis: ?Actinomyces bacteremia and pyonephrosis and cholangitis ?Baseline Creatinine 1 ? ? ? ?No Known Allergies ? ?OPAT Orders ?Ceftriaxone 2 grams IV every 24 hours ?Duration: ?30 days ?End Date: ?03/14/22 ? ?[facility] Care Per Protocol: ? ?Labs weekly while on IV antibiotics: ?_X_ CBC with differential ? ?_X_ CMP ? ? ?_X_ Please pull PIC at completion of IV antibiotics ? ? ?Fax weekly labs to [phone] ? ?Clinic Follow Up Appt: 03/05/22 at 11.45am ? ? ?Call [phone] with any concerns or critical value ? ?  ?

## 2022-02-13 NOTE — Discharge Instructions (Signed)
PICC line care per protocol °

## 2022-02-13 NOTE — Progress Notes (Signed)
Notified NP pt used bedpan and urine is bloody. Pt pod 2 from stent placement, no orders received.  ?

## 2022-02-13 NOTE — Care Management Important Message (Signed)
Important Message ? ?Patient Details  ?Name: Kim Graham ?MRN: [id number] ?Date of Birth: [birth year] ? ? ?Medicare Important Message Given:  Yes ? ? ? ? ?[name] A [name] ?02/13/2022, 11:27 AM ?

## 2022-02-13 NOTE — Plan of Care (Signed)
?  Problem: Education: ?Goal: Knowledge of General Education information will improve ?Description: Including pain rating scale, medication(s)/side effects and non-pharmacologic comfort measures ?02/13/2022 1607 by Orvan Seen, RN ?Outcome: Completed/Met ?02/13/2022 1212 by Orvan Seen, RN ?Outcome: Progressing ?  ?Problem: Health Behavior/Discharge Planning: ?Goal: Ability to manage health-related needs will improve ?02/13/2022 1607 by Orvan Seen, RN ?Outcome: Completed/Met ?02/13/2022 1212 by Orvan Seen, RN ?Outcome: Progressing ?  ?Problem: Clinical Measurements: ?Goal: Ability to maintain clinical measurements within normal limits will improve ?02/13/2022 1607 by Orvan Seen, RN ?Outcome: Completed/Met ?02/13/2022 1212 by Orvan Seen, RN ?Outcome: Progressing ?Goal: Will remain free from infection ?02/13/2022 1607 by Orvan Seen, RN ?Outcome: Completed/Met ?02/13/2022 1212 by Orvan Seen, RN ?Outcome: Progressing ?Goal: Diagnostic test results will improve ?02/13/2022 1607 by Orvan Seen, RN ?Outcome: Completed/Met ?02/13/2022 1212 by Orvan Seen, RN ?Outcome: Progressing ?Goal: Respiratory complications will improve ?02/13/2022 1607 by Orvan Seen, RN ?Outcome: Completed/Met ?02/13/2022 1212 by Orvan Seen, RN ?Outcome: Progressing ?Goal: Cardiovascular complication will be avoided ?02/13/2022 1607 by Orvan Seen, RN ?Outcome: Completed/Met ?02/13/2022 1212 by Orvan Seen, RN ?Outcome: Progressing ?  ?Problem: Activity: ?Goal: Risk for activity intolerance will decrease ?02/13/2022 1607 by Orvan Seen, RN ?Outcome: Completed/Met ?02/13/2022 1212 by Orvan Seen, RN ?Outcome: Progressing ?  ?Problem: Nutrition: ?Goal: Adequate nutrition will be maintained ?02/13/2022 1607 by Orvan Seen, RN ?Outcome: Completed/Met ?02/13/2022 1212 by Orvan Seen, RN ?Outcome: Progressing ?  ?Problem: Coping: ?Goal: Level of anxiety will decrease ?02/13/2022 1607 by Orvan Seen, RN ?Outcome: Completed/Met ?02/13/2022 1212 by Orvan Seen, RN ?Outcome: Progressing ?  ?Problem: Elimination: ?Goal: Will not experience complications related to bowel motility ?02/13/2022 1607 by Orvan Seen, RN ?Outcome: Completed/Met ?02/13/2022 1212 by Orvan Seen, RN ?Outcome: Progressing ?Goal: Will not experience complications related to urinary retention ?02/13/2022 1607 by Orvan Seen, RN ?Outcome: Completed/Met ?02/13/2022 1212 by Orvan Seen, RN ?Outcome: Progressing ?  ?Problem: Pain Managment: ?Goal: General experience of comfort will improve ?02/13/2022 1607 by Orvan Seen, RN ?Outcome: Completed/Met ?02/13/2022 1212 by Orvan Seen, RN ?Outcome: Progressing ?  ?Problem: Safety: ?Goal: Ability to remain free from injury will improve ?02/13/2022 1607 by Orvan Seen, RN ?Outcome: Completed/Met ?02/13/2022 1212 by Orvan Seen, RN ?Outcome: Progressing ?  ?Problem: Skin Integrity: ?Goal: Risk for impaired skin integrity will decrease ?02/13/2022 1607 by Orvan Seen, RN ?Outcome: Completed/Met ?02/13/2022 1212 by Orvan Seen, RN ?Outcome: Progressing ?  ?

## 2022-02-15 LAB — CULTURE, BLOOD (ROUTINE X 2)
Culture: NO GROWTH
Culture: NO GROWTH
Special Requests: ADEQUATE
Special Requests: ADEQUATE

## 2022-02-16 LAB — URINE CULTURE
Culture: >=100000 — AB
Culture: >=100000 — AB

## 2022-02-16 LAB — MISC LABCORP TEST (SEND OUT): Labcorp test code: 791584

## 2022-02-24 LAB — MISC LABCORP TEST (SEND OUT)
LabCorp test name: 182857
Labcorp test code: 182857

## 2022-03-05 ENCOUNTER — Ambulatory Visit: Payer: 59 | Attending: Infectious Diseases | Admitting: Infectious Diseases

## 2022-03-05 ENCOUNTER — Encounter: Payer: Self-pay | Admitting: Infectious Diseases

## 2022-03-05 VITALS — BP 135/70 | HR 84 | Temp 97.3°F | Ht 64.0 in | Wt 105.0 lb

## 2022-03-05 DIAGNOSIS — N390 Urinary tract infection, site not specified: Secondary | ICD-10-CM | POA: Insufficient documentation

## 2022-03-05 DIAGNOSIS — R7881 Bacteremia: Secondary | ICD-10-CM

## 2022-03-05 DIAGNOSIS — Z87891 Personal history of nicotine dependence: Secondary | ICD-10-CM | POA: Diagnosis not present

## 2022-03-05 DIAGNOSIS — N201 Calculus of ureter: Secondary | ICD-10-CM | POA: Diagnosis not present

## 2022-03-05 DIAGNOSIS — J449 Chronic obstructive pulmonary disease, unspecified: Secondary | ICD-10-CM | POA: Insufficient documentation

## 2022-03-05 DIAGNOSIS — K7460 Unspecified cirrhosis of liver: Secondary | ICD-10-CM | POA: Diagnosis not present

## 2022-03-05 DIAGNOSIS — N1330 Unspecified hydronephrosis: Secondary | ICD-10-CM | POA: Diagnosis not present

## 2022-03-05 DIAGNOSIS — A429 Actinomycosis, unspecified: Secondary | ICD-10-CM | POA: Insufficient documentation

## 2022-03-05 DIAGNOSIS — E119 Type 2 diabetes mellitus without complications: Secondary | ICD-10-CM | POA: Insufficient documentation

## 2022-03-05 MED ORDER — AMOXICILLIN-POT CLAVULANATE 875-125 MG PO TABS: 1.0000 | ORAL_TABLET | Freq: Two times a day (BID) | ORAL | 1 refills | Status: DC

## 2022-03-05 NOTE — Progress Notes (Signed)
NAME: Kim MaudlinMartha L Graham  ?DOB: 1951-03-10  ?MRN: [id number]  ?Date/Time: 03/05/2022 11:25 AM ? ? ?Here for follow up after recent hospitalization when she was treated for actinomyces bacteremia ?? ?Kim ServantMartha L Graham is a 71 y.o. female with a history of DM, COPD, Gall stones, cholecystectomy, renal stone is here for follow up for Actinomyces bacteremia ?[facility] in the past three months ? 12/09/21-12/13/21  with pain abdomen and found to have gall stones and Dilatation of CBD- she also had rt ureteric stone obstruction- she underwent cystoscopy with rt ureteric stent placement by Dr.Stoioff and  robotic laparoscopic partial cholecystectomy robotic on 12/09/21. There was biliary leak for which she underwent ERCP on 12/11/21  and the entire main bile duct was dilated,a biliary sphincterotomy was performed.and a stent was placed in the common bile duct. ?She was discharged home on 12/13/21- ?She then saw GI as OP on 02/04/22 and the plan was to remove the stent in 1 month ?[facility]  on [facility]/22 -02/13/22 -with weakness, confusion and blood culture had actinomyces naeslundii . ?CT abdomen showed rt double J stent without rt hydro, moderate left hydro with stone at the left UPJ, nodular liver and pneumobilia ?On 4/5 she underwent  cystoscopy, left ureteral stent placement and exchange of rt ureteral stent.There was pyonephrosis left kidney- urine culture taken at the procedure showed candida and lactobacillus ?She was on IV unasyn while in the hospital and was discharged on ceftriaxone to complete 6 weeks of IV -on 5/6 and then go on Po amoxicillin ?She also was sent on PO fluconazole for 2 weeks ?The source for actinomyces was thought to be either  the CBD VS pyonephrosis ? ?[facility] is [facility] today [facility] ?As per the staff there she is pallning to go home tomorrow for 2 reasons. She wants tot ake care of her pets ?And the insurance may not cover her stay longer than that ? ?She has no fever or  chills ?Says she is walking in the SNF ? ? ?Past Medical History:  ?Diagnosis Date  ? COPD (chronic obstructive pulmonary disease) (HCC)   ? Diabetes mellitus type 2, diet-controlled (HCC) 04/15/2018  ?  ?Past Surgical History:  ?Procedure Laterality Date  ? CYSTOSCOPY W/ URETERAL STENT PLACEMENT Right 12/09/2021  ? Procedure: CYSTOSCOPY WITH RETROGRADE PYELOGRAM/URETERAL STENT PLACEMENT;  Surgeon: Riki AltesStoioff, Scott C, MD;  Location: [facility] ORS;  Service: Urology;  Laterality: Right;  ? CYSTOSCOPY/URETEROSCOPY/HOLMIUM LASER/STENT PLACEMENT Bilateral 02/11/2022  ? Procedure: CYSTOSCOPY/URETEROSCOPY/HOLMIUM LASER/STENT PLACEMENT & RIGHT URETERAL STENT EXCHANGE;  Surgeon: Riki AltesStoioff, Scott C, MD;  Location: [facility] ORS;  Service: Urology;  Laterality: Bilateral;  ? ENDOSCOPIC RETROGRADE CHOLANGIOPANCREATOGRAPHY (ERCP) WITH PROPOFOL N/A 12/11/2021  ? Procedure: ENDOSCOPIC RETROGRADE CHOLANGIOPANCREATOGRAPHY (ERCP) WITH PROPOFOL;  Surgeon: Midge MiniumWohl, Darren, MD;  Location: [facility] ENDOSCOPY;  Service: Endoscopy;  Laterality: N/A;  ?  ?Social History  ? ?Socioeconomic History  ? Marital status: Single  ?  Spouse name: Not on file  ? Number of children: Not on file  ? Years of education: Not on file  ? Highest education level: Not on file  ?Occupational History  ? Not on file  ?Tobacco Use  ? Smoking status: Former  ?  Packs/day: 0.50  ?  Years: 49.00  ?  Pack years: 24.50  ?  Types: Cigarettes  ?  Quit date: 12/13/2018  ?  Years since quitting: 3.2  ? Smokeless tobacco: Never  ?Vaping Use  ? Vaping Use: Never  used  ?Substance and Sexual Activity  ? Alcohol use: Not Currently  ?  Comment: On occasion  ? Drug use: No  ? Sexual activity: Not Currently  ?Other Topics Concern  ? Not on file  ?Social History Narrative  ? Not on file  ? ?Social Determinants of Health  ? ?Financial Resource Strain: Medium Risk  ? Difficulty of Paying Living Expenses: Somewhat hard  ?Food Insecurity: Food Insecurity Present  ? Worried About Programme researcher, broadcasting/film/video in the Last  Year: Sometimes true  ? Ran Out of Food in the Last Year: Sometimes true  ?Transportation Needs: No Transportation Needs  ? Lack of Transportation (Medical): No  ? Lack of Transportation (Non-Medical): No  ?Physical Activity: Insufficiently Active  ? Days of Exercise per Week: 4 days  ? Minutes of Exercise per Session: 20 min  ?Stress: No Stress Concern Present  ? Feeling of Stress : Not at all  ?Social Connections: Moderately Isolated  ? Frequency of Communication with Friends and Family: More than three times a week  ? Frequency of Social Gatherings with Friends and Family: Three times a week  ? Attends Religious Services: Never  ? Active Member of Clubs or Organizations: No  ? Attends Banker Meetings: More than 4 times per year  ? Marital Status: Divorced  ?Intimate Partner Violence: Not At Risk  ? Fear of Current or Ex-Partner: No  ? Emotionally Abused: No  ? Physically Abused: No  ? Sexually Abused: No  ?  ?Family History  ?Problem Relation Age of Onset  ? Diabetes Mother   ? Cancer Father   ?     Pancreatic  ? Diabetes Maternal Aunt   ? Diabetes Maternal Grandmother   ? Dementia Maternal Grandfather   ? Heart disease Maternal Grandfather   ? ?No Known Allergies ?I? ?Current Outpatient Medications  ?Medication Sig Dispense Refill  ? albuterol (VENTOLIN HFA) 108 (90 Base) MCG/ACT inhaler Inhale 2 puffs into the lungs every 6 (six) hours as needed for wheezing or shortness of breath. 8.5 g 2  ? ARIPiprazole (ABILIFY) 20 MG tablet Take 1 tablet (20 mg total) by mouth daily. 90 tablet 1  ? ascorbic acid (VITAMIN C) 500 MG tablet Take by mouth.    ? cefTRIAXone (ROCEPHIN) IVPB Inject 2 g into the vein daily for 28 days. Indication:  Actinomyces bacteremia, pyonephrosis and cholangitis ?Last Day of Therapy:  03/14/2022 ?Labs - Once weekly:  CBC/D and CMP ?Please pull PIC at completion of IV antibiotics ?Fax weekly labs to [phone] 29 Units 0  ? Multiple Vitamin (MULTI-VITAMIN) tablet Take 1 tablet  by mouth daily.    ? polyethylene glycol (MIRALAX / GLYCOLAX) 17 g packet Take 17 g by mouth daily. 14 each 0  ? senna-docusate (SENOKOT-S) 8.6-50 MG tablet Take 1 tablet by mouth 2 (two) times daily. 30 tablet 0  ? umeclidinium-vilanterol (ANORO ELLIPTA) 62.5-25 MCG/ACT AEPB Inhale 1 puff into the lungs daily at 6 (six) AM. 1 each 12  ? potassium chloride SA (KLOR-CON M) 20 MEQ tablet Take 1 tablet (20 mEq total) by mouth daily for 4 days. 4 tablet 0  ? ?No current facility-administered medications for this visit.  ?  ? ?Abtx:  ?Anti-infectives (From admission, onward)  ? ? None  ? ?  ? ? ?REVIEW OF SYSTEMS:  ?Const: negative fever, negative chills, + weight loss ?Eyes: negative diplopia or visual changes, negative eye pain ?ENT: negative coryza, negative sore throat ?Resp: negative cough,  hemoptysis, dyspnea ?Cards: negative for chest pain, palpitations, lower extremity edema ?GU: negative for frequency, dysuria and hematuria ?GI: Negative for abdominal pain, diarrhea, bleeding, constipation ?Skin: negative for rash and pruritus ?Heme: negative for easy bruising and gum/nose bleeding ?MS: weakness ?Neurolo:negative for headaches, dizziness, vertigo, memory problems  ?Psych: negative for feelings of anxiety, depression  ?Endocrine: negative for thyroid, diabetes ?Allergy/Immunology- negative for any medication or food allergies ?? ?Pertinent Positives include : ?Objective:  ?VITALS:  ?BP 135/70   Pulse 84   Temp (!) 97.3 ?F (36.3 ?C) (Temporal)   Ht 5\' 4"  (1.626 m)   Wt 105 lb (47.6 kg)   BMI 18.02 kg/m?  ?PHYSICAL EXAM:  ?General: Alert, cooperative, no distress, appears stated age.  ?Head: Normocephalic, without obvious abnormality, atraumatic. ?Eyes: Conjunctivae clear, anicteric sclerae. Pupils are equal ?ENT Nares normal. No drainage or sinus tenderness. ?Lips, mucosa, and tongue normal. No Thrush ?Neck: Supple, symmetrical, no adenopathy, thyroid: non tender ?no carotid bruit and no JVD. ?Back: No CVA  tenderness. ?Lungs: Clear to auscultation bilaterally. No Wheezing or Rhonchi. No rales. ?Heart: Regular rate and rhythm, no murmur, rub or gallop. ?Abdomen: Soft, non-tender,not distended. Bowel sounds normal. No

## 2022-03-05 NOTE — Patient Instructions (Addendum)
If you are leaving Skilled nursing facility tomorrow then your PICC line will be removed and you will startling  Amoxicillin/clavulunate also called augmentin 875 mg One every 12 hrs for 6-8 weeks- prescription sent to [facility] ?You need to see the urologist Sr.Stoioff for the kidney stent- appt on may 1st ?You need to see Dr.Wohl- gastroenterologist for the stent in your bile duct- his phone number is [phone] ?You will see me back in 4 weeks ?

## 2022-03-09 ENCOUNTER — Encounter: Payer: Self-pay | Admitting: Urology

## 2022-03-09 ENCOUNTER — Encounter: Payer: Self-pay | Admitting: Gastroenterology

## 2022-03-09 ENCOUNTER — Ambulatory Visit: Payer: 59 | Admitting: Urology

## 2022-03-10 ENCOUNTER — Other Ambulatory Visit: Payer: Self-pay

## 2022-03-10 ENCOUNTER — Ambulatory Visit: Payer: 59 | Admitting: Certified Registered"

## 2022-03-10 ENCOUNTER — Encounter
Admission: RE | Disposition: A | Discharge status: Home / Self Care | Payer: Self-pay | Source: Home / Self Care | Attending: Gastroenterology

## 2022-03-10 ENCOUNTER — Encounter: Payer: Self-pay | Admitting: Family Medicine

## 2022-03-10 ENCOUNTER — Ambulatory Visit: Payer: 59

## 2022-03-10 ENCOUNTER — Ambulatory Visit
Admission: RE | Admit: 2022-03-10 | Discharge: 2022-03-10 | Disposition: A | Discharge status: Home / Self Care | Payer: 59 | Attending: Gastroenterology | Admitting: Gastroenterology

## 2022-03-10 DIAGNOSIS — N289 Disorder of kidney and ureter, unspecified: Secondary | ICD-10-CM | POA: Diagnosis not present

## 2022-03-10 DIAGNOSIS — Z4689 Encounter for fitting and adjustment of other specified devices: Secondary | ICD-10-CM | POA: Diagnosis not present

## 2022-03-10 DIAGNOSIS — Z87891 Personal history of nicotine dependence: Secondary | ICD-10-CM | POA: Insufficient documentation

## 2022-03-10 DIAGNOSIS — J449 Chronic obstructive pulmonary disease, unspecified: Secondary | ICD-10-CM | POA: Diagnosis not present

## 2022-03-10 DIAGNOSIS — Z4659 Encounter for fitting and adjustment of other gastrointestinal appliance and device: Secondary | ICD-10-CM | POA: Diagnosis not present

## 2022-03-10 DIAGNOSIS — K839 Disease of biliary tract, unspecified: Secondary | ICD-10-CM

## 2022-03-10 DIAGNOSIS — E119 Type 2 diabetes mellitus without complications: Secondary | ICD-10-CM | POA: Diagnosis not present

## 2022-03-10 HISTORY — PX: ERCP: SHX5425

## 2022-03-10 SURGERY — ERCP, WITH INTERVENTION IF INDICATED
Anesthesia: General

## 2022-03-10 MED ORDER — LIDOCAINE HCL (CARDIAC) PF 100 MG/5ML IV SOSY
PREFILLED_SYRINGE | INTRAVENOUS | Status: DC | PRN
  Administered 2022-03-10: 100 mg via INTRAVENOUS

## 2022-03-10 MED ORDER — PROPOFOL 500 MG/50ML IV EMUL
INTRAVENOUS | Status: DC | PRN
  Administered 2022-03-10: 100 ug/kg/min via INTRAVENOUS

## 2022-03-10 MED ORDER — LACTATED RINGERS IV SOLN: INTRAVENOUS | Status: DC

## 2022-03-10 MED ORDER — MIDAZOLAM HCL 2 MG/2ML IJ SOLN
INTRAMUSCULAR | Status: AC
  Filled 2022-03-10: qty 2

## 2022-03-10 MED ORDER — DEXMEDETOMIDINE HCL IN NACL 200 MCG/50ML IV SOLN
INTRAVENOUS | Status: DC | PRN
  Administered 2022-03-10: 8 ug via INTRAVENOUS

## 2022-03-10 MED ORDER — SODIUM CHLORIDE 0.9 % IV SOLN: INTRAVENOUS | Status: DC

## 2022-03-10 MED ORDER — MIDAZOLAM HCL 2 MG/2ML IJ SOLN
INTRAMUSCULAR | Status: DC | PRN
  Administered 2022-03-10: 2 mg via INTRAVENOUS

## 2022-03-10 NOTE — Anesthesia Postprocedure Evaluation (Signed)
Anesthesia Post Note ? ?Patient: Kim Graham ? ?Procedure(s) Performed: ENDOSCOPIC RETROGRADE CHOLANGIOPANCREATOGRAPHY (ERCP) ? ?Patient location during evaluation: Endoscopy ?Anesthesia Type: General ?Level of consciousness: awake and alert ?Pain management: pain level controlled ?Vital Signs Assessment: post-procedure vital signs reviewed and stable ?Respiratory status: spontaneous breathing, nonlabored ventilation, respiratory function stable and patient connected to nasal cannula oxygen ?Cardiovascular status: blood pressure returned to baseline and stable ?Postop Assessment: no apparent nausea or vomiting ?Anesthetic complications: no ? ? ?No notable events documented. ? ? ?Last Vitals:  ?Vitals:  ? 03/10/22 1211 03/10/22 1221  ?BP: 106/70 124/64  ?Pulse: 73 74  ?Resp: 14 17  ?Temp: 36.6 ?C   ?SpO2: 100% 100%  ?  ?Last Pain:  ?Vitals:  ? 03/10/22 1221  ?TempSrc:   ?PainSc: 0-No pain  ? ? ?  ?  ?  ?  ?  ?  ? ?Cleda Mccreedy Kailan Laws ? ? ? ? ?

## 2022-03-10 NOTE — Anesthesia Preprocedure Evaluation (Addendum)
Anesthesia Evaluation  ?Patient identified by MRN, date of birth, ID band ?Patient awake ? ? ? ?Reviewed: ?Allergy & Precautions, NPO status , Patient's Chart, lab work & pertinent test results ? ?History of Anesthesia Complications ?Negative for: history of anesthetic complications ? ?Airway ?Mallampati: III ? ?TM Distance: <3 FB ?Neck ROM: full ? ? ? Dental ? ?(+) Chipped, Poor Dentition, Missing, Upper Dentures ?  ?Pulmonary ?COPD, former smoker,  ?  ?Pulmonary exam normal ? ? ? ? ? ? ? Cardiovascular ?Exercise Tolerance: Good ?(-) angina(-) Past MI and (-) DOE Normal cardiovascular exam ? ? ?  ?Neuro/Psych ?PSYCHIATRIC DISORDERS negative neurological ROS ?   ? GI/Hepatic ?negative GI ROS, neg GERD  ,(+) Hepatitis -  ?Endo/Other  ?diabetes, Type 2 ? Renal/GU ?Renal disease  ?negative genitourinary ?  ?Musculoskeletal ? ? Abdominal ?  ?Peds ? Hematology ?negative hematology ROS ?(+)   ?Anesthesia Other Findings ?Patient is NPO appropriate and reports no nausea or vomiting today. ? ?Past Medical History: ?No date: COPD (chronic obstructive pulmonary disease) (HCC) ?04/15/2018: Diabetes mellitus type 2, diet-controlled (HCC) ? ?Past Surgical History: ?12/09/2021: CYSTOSCOPY W/ URETERAL STENT PLACEMENT; Right ?    Comment:  Procedure: CYSTOSCOPY WITH RETROGRADE PYELOGRAM/URETERAL ?             STENT PLACEMENT;  Surgeon: Riki Altes, MD;   ?             Location: [facility] ORS;  Service: Urology;  Laterality:  ?             Right; ?02/11/2022: CYSTOSCOPY/URETEROSCOPY/HOLMIUM LASER/STENT PLACEMENT;  ?Bilateral ?    Comment:  Procedure: CYSTOSCOPY/URETEROSCOPY/HOLMIUM LASER/STENT  ?             PLACEMENT & RIGHT URETERAL STENT EXCHANGE;  Surgeon:  ?             Riki Altes, MD;  Location: [facility] ORS;  Service:  ?             Urology;  Laterality: Bilateral; ?12/11/2021: ENDOSCOPIC RETROGRADE CHOLANGIOPANCREATOGRAPHY (ERCP) WITH  ?PROPOFOL; N/A ?    Comment:  Procedure: ENDOSCOPIC RETROGRADE   ?             CHOLANGIOPANCREATOGRAPHY (ERCP) WITH PROPOFOL;  Surgeon:  ?             [name], MD;  Location: [facility] ENDOSCOPY;  Service:  ?             Endoscopy;  Laterality: N/A; ? ?BMI   ? Body Mass Index: 18.01 kg/m?  ?  ? ? Reproductive/Obstetrics ?negative OB ROS ? ?  ? ? ? ? ? ? ? ? ? ? ? ? ? ?  ?  ? ? ? ? ? ? ? ?Anesthesia Physical ?Anesthesia Plan ? ?ASA: 3 ? ?Anesthesia Plan: General  ? ?Post-op Pain Management:   ? ?Induction: Intravenous ? ?PONV Risk Score and Plan: Propofol infusion and TIVA ? ?Airway Management Planned: Natural Airway and Nasal Cannula ? ?Additional Equipment:  ? ?Intra-op Plan:  ? ?Post-operative Plan:  ? ?Informed Consent: I have reviewed the patients History and Physical, chart, labs and discussed the procedure including the risks, benefits and alternatives for the proposed anesthesia with the patient or authorized representative who has indicated his/her understanding and acceptance.  ? ? ? ?Dental Advisory Given ? ?Plan Discussed with: Anesthesiologist, CRNA and Surgeon ? ?Anesthesia Plan Comments: (Patient consented for risks of anesthesia including but not limited to:  ?- adverse reactions to  medications ?- risk of airway placement if required ?- damage to eyes, teeth, lips or other oral mucosa ?- nerve damage due to positioning  ?- sore throat or hoarseness ?- Damage to heart, brain, nerves, lungs, other parts of body or loss of life ? ?Patient voiced understanding.)  ? ? ? ? ? ? ?Anesthesia Quick Evaluation ? ?

## 2022-03-10 NOTE — Transfer of Care (Signed)
Immediate Anesthesia Transfer of Care Note ? ?Patient: Kim Graham ? ?Procedure(s) Performed: ENDOSCOPIC RETROGRADE CHOLANGIOPANCREATOGRAPHY (ERCP) ? ?Patient Location: PACU and Endoscopy Unit ? ?Anesthesia Type:General ? ?Level of Consciousness: awake ? ?Airway & Oxygen Therapy: Patient Spontanous Breathing ? ?Post-op Assessment: Report given to RN ? ?Post vital signs: stable ? ?Last Vitals:  ?Vitals Value Taken Time  ?BP 107/48 03/10/22 1201  ?Temp    ?Pulse 69 03/10/22 1201  ?Resp 16 03/10/22 1201  ?SpO2 100 % 03/10/22 1201  ? ? ?Last Pain:  ?Vitals:  ? 03/10/22 1201  ?TempSrc:   ?PainSc: Asleep  ?   ? ?  ? ?Complications: No notable events documented. ?

## 2022-03-10 NOTE — Op Note (Signed)
[facility] ?Gastroenterology ?Patient Name: Kim Graham ?Procedure Date: 03/10/2022 11:10 AM ?MRN: [id number] ?Account #: [id number] ?Date of Birth: 04/26/1951 ?Admit Type: Outpatient ?Age: 71 ?Room: [facility] ENDO ROOM 4 ?Gender: Female ?Note Status: Finalized ?Instrument Name: EXALT Ercp scope ?Procedure:             ERCP ?Indications:           Stent removal ?Providers:             Lucilla Lame MD, MD ?Medicines:             Propofol per Anesthesia ?Complications:         No immediate complications. ?Procedure:             Pre-Anesthesia Assessment: ?                       - Prior to the procedure, a History and Physical was  ?                       performed, and patient medications and allergies were  ?                       reviewed. The patient's tolerance of previous  ?                       anesthesia was also reviewed. The risks and benefits  ?                       of the procedure and the sedation options and risks  ?                       were discussed with the patient. All questions were  ?                       answered, and informed consent was obtained. Prior  ?                       Anticoagulants: The patient has taken no previous  ?                       anticoagulant or antiplatelet agents. ASA Grade  ?                       Assessment: II - A patient with mild systemic disease.  ?                       After reviewing the risks and benefits, the patient  ?                       was deemed in satisfactory condition to undergo the  ?                       procedure. ?                       - Prior to the procedure, a History and Physical was  ?                       performed, and patient medications and allergies were  ?  reviewed. The patient's tolerance of previous  ?                       anesthesia was also reviewed. The risks and benefits  ?                       of the procedure and the sedation options and risks  ?                       were discussed with  the patient. All questions were  ?                       answered, and informed consent was obtained. Prior  ?                       Anticoagulants: The patient has taken no previous  ?                       anticoagulant or antiplatelet agents. ASA Grade  ?                       Assessment: II - A patient with mild systemic disease.  ?                       After reviewing the risks and benefits, the patient  ?                       was deemed in satisfactory condition to undergo the  ?                       procedure. ?                       After obtaining informed consent, the scope was passed  ?                       under direct vision. Throughout the procedure, the  ?                       patient's blood pressure, pulse, and oxygen  ?                       saturations were monitored continuously. The [place]  ?                       Scientific [employer] D single use duodenoscope was  ?                       introduced through the mouth, and used to inject  ?                       contrast into and used to inject contrast into the  ?                       bile duct. The ERCP was accomplished without  ?                       difficulty. The patient tolerated the procedure well. ?Findings: ?  A scout film of the abdomen was obtained. One stent ending in the main  ?     bile duct was seen. One plastic stent originating in the biliary tree  ?     was emerging from the major papilla. One stent was removed from the  ?     biliary tree using a snare. A wire was passed into the biliary tree. The  ?     bile duct was deeply cannulated with the 15 mm balloon. Contrast was  ?     injected. I personally interpreted the bile duct images. There was brisk  ?     flow of contrast through the ducts. Image quality was excellent.  ?     Contrast extended to the entire biliary tree. Extravasation of contrast  ?     originating from the common bile duct was observed. One 10 Fr by 7 cm  ?     plastic stent with a single  external flap and a single internal flap was  ?     placed 5 cm into the common bile duct. Bile flowed through the stent.  ?     The stent was in good position. ?Impression:            - One stent from the biliary tree was seen in the  ?                       major papilla. ?                       - A bile leak was found. ?                       - One stent was removed from the biliary tree. ?                       - One plastic stent was placed into the common bile  ?                       duct. ?Recommendation:        - Discharge patient to home. ?                       - Resume previous diet. ?                       - Repeat ERCP in 3 months to remove stent. ?Procedure Code(s):     --- Professional --- ?                       [phone], Endoscopic retrograde cholangiopancreatography  ?                       (ERCP); with removal and exchange of stent(s), biliary  ?                       or pancreatic duct, including pre- and post-dilation  ?                       and guide wire passage, when performed, including  ?  sphincterotomy, when performed, each stent exchanged ?                       [id number], Endoscopic catheterization of the biliary  ?                       ductal system, radiological supervision and  ?                       interpretation ?Diagnosis Code(s):     --- Professional --- ?                       K83.9, Disease of biliary tract, unspecified ?                       Z46.59, Encounter for fitting and adjustment of other  ?                       gastrointestinal appliance and device ?CPT copyright 2019 American Medical Association. All rights reserved. ?The codes documented in this report are preliminary and upon coder review may  ?be revised to meet current compliance requirements. ?Lucilla Lame MD, MD ?03/10/2022 12:01:38 PM ?This report has been signed electronically. ?Number of Addenda: 0 ?Note Initiated On: 03/10/2022 11:10 AM ?Estimated Blood Loss:  Estimated blood loss: none. ?      [facility] ?

## 2022-03-10 NOTE — H&P (Signed)
? ?[name], MD [facility] ?[phone] ?[place], [place] [address] ?[phone] ?Fax : [phone] ? ?Primary Care Physician:  Dorcas Carrow, DO ?Primary Gastroenterologist:  Dr. Servando Snare ? ?Pre-Procedure History & Physical: ?HPI:  Kim Graham is a 71 y.o. female is here for an ERCP. ?  ?Past Medical History:  ?Diagnosis Date  ? COPD (chronic obstructive pulmonary disease) (HCC)   ? Diabetes mellitus type 2, diet-controlled (HCC) 04/15/2018  ? ? ?Past Surgical History:  ?Procedure Laterality Date  ? CYSTOSCOPY W/ URETERAL STENT PLACEMENT Right 12/09/2021  ? Procedure: CYSTOSCOPY WITH RETROGRADE PYELOGRAM/URETERAL STENT PLACEMENT;  Surgeon: Riki Altes, MD;  Location: [facility] ORS;  Service: Urology;  Laterality: Right;  ? CYSTOSCOPY/URETEROSCOPY/HOLMIUM LASER/STENT PLACEMENT Bilateral 02/11/2022  ? Procedure: CYSTOSCOPY/URETEROSCOPY/HOLMIUM LASER/STENT PLACEMENT & RIGHT URETERAL STENT EXCHANGE;  Surgeon: Riki Altes, MD;  Location: [facility] ORS;  Service: Urology;  Laterality: Bilateral;  ? ENDOSCOPIC RETROGRADE CHOLANGIOPANCREATOGRAPHY (ERCP) WITH PROPOFOL N/A 12/11/2021  ? Procedure: ENDOSCOPIC RETROGRADE CHOLANGIOPANCREATOGRAPHY (ERCP) WITH PROPOFOL;  Surgeon: [name], MD;  Location: [facility] ENDOSCOPY;  Service: Endoscopy;  Laterality: N/A;  ? ? ?Prior to Admission medications   ?Medication Sig Start Date End Date Taking? Authorizing Provider  ?albuterol (VENTOLIN HFA) 108 (90 Base) MCG/ACT inhaler Inhale 2 puffs into the lungs every 6 (six) hours as needed for wheezing or shortness of breath. 10/14/21  Yes Johnson, Megan P, DO  ?amoxicillin-clavulanate (AUGMENTIN) 875-125 MG tablet Take 1 tablet by mouth 2 (two) times daily. 03/05/22  Yes Lynn Ito, MD  ?ARIPiprazole (ABILIFY) 20 MG tablet Take 1 tablet (20 mg total) by mouth daily. 10/14/21   Olevia Perches P, DO  ?ascorbic acid (VITAMIN C) 500 MG tablet Take by mouth.    [provider]  ?Multiple Vitamin (MULTI-VITAMIN)  tablet Take 1 tablet by mouth daily.    [provider]  ?polyethylene glycol (MIRALAX / GLYCOLAX) 17 g packet Take 17 g by mouth daily. 02/14/22   Enedina Finner, MD  ?potassium chloride SA (KLOR-CON M) 20 MEQ tablet Take 1 tablet (20 mEq total) by mouth daily for 4 days. 02/13/22 02/17/22  Enedina Finner, MD  ?senna-docusate (SENOKOT-S) 8.6-50 MG tablet Take 1 tablet by mouth 2 (two) times daily. 02/13/22   Enedina Finner, MD  ?umeclidinium-vilanterol [facility] ELLIPTA) 62.5-25 MCG/ACT AEPB Inhale 1 puff into the lungs daily at 6 (six) AM. 10/14/21   Dorcas Carrow, DO  ? ? ?Allergies as of 02/04/2022  ? (No Known Allergies)  ? ? ?Family History  ?Problem Relation Age of Onset  ? Diabetes Mother   ? Cancer Father   ?     Pancreatic  ? Diabetes Maternal Aunt   ? Diabetes Maternal Grandmother   ? Dementia Maternal Grandfather   ? Heart disease Maternal Grandfather   ? ? ?Social History  ? ?Socioeconomic History  ? Marital status: Single  ?  Spouse name: Not on file  ? Number of children: Not on file  ? Years of education: Not on file  ? Highest education level: Not on file  ?Occupational History  ? Not on file  ?Tobacco Use  ? Smoking status: Former  ?  Packs/day: 0.50  ?  Years: 49.00  ?  Pack years: 24.50  ?  Types: Cigarettes  ?  Quit date: 12/13/2018  ?  Years since quitting: 3.2  ? Smokeless tobacco: Never  ?Vaping Use  ? Vaping Use: Never used  ?Substance and Sexual Activity  ? Alcohol use: Not Currently  ?  Comment: On occasion  ? Drug use: No  ? Sexual activity: Not Currently  ?Other Topics Concern  ? Not on file  ?Social History Narrative  ? Not on file  ? ?Social Determinants of Health  ? ?Financial Resource Strain: Medium Risk  ? Difficulty of Paying Living Expenses: Somewhat hard  ?Food Insecurity: Food Insecurity Present  ? Worried About Programme researcher, broadcasting/film/video in the Last Year: Sometimes true  ? Ran Out of Food in the Last Year: Sometimes true  ?Transportation Needs: No Transportation Needs  ? Lack of Transportation  (Medical): No  ? Lack of Transportation (Non-Medical): No  ?Physical Activity: Insufficiently Active  ? Days of Exercise per Week: 4 days  ? Minutes of Exercise per Session: 20 min  ?Stress: No Stress Concern Present  ? Feeling of Stress : Not at all  ?Social Connections: Moderately Isolated  ? Frequency of Communication with Friends and Family: More than three times a week  ? Frequency of Social Gatherings with Friends and Family: Three times a week  ? Attends Religious Services: Never  ? Active Member of Clubs or Organizations: No  ? Attends Banker Meetings: More than 4 times per year  ? Marital Status: Divorced  ?Intimate Partner Violence: Not At Risk  ? Fear of Current or Ex-Partner: No  ? Emotionally Abused: No  ? Physically Abused: No  ? Sexually Abused: No  ? ? ?Review of Systems: ?See HPI, otherwise negative ROS ? ?Physical Exam: ?BP 116/66   Pulse 90   Temp 98.9 ?F (37.2 ?C) (Tympanic)   Resp 17   Ht 5\' 4"  (1.626 m)   Wt 47.6 kg   SpO2 94%   BMI 18.01 kg/m?  ?General:   Alert,  pleasant and cooperative in NAD ?Head:  Normocephalic and atraumatic. ?Neck:  Supple; no masses or thyromegaly. ?Lungs:  Clear throughout to auscultation.    ?Heart:  Regular rate and rhythm. ?Abdomen:  Soft, nontender and nondistended. Normal bowel sounds, without guarding, and without rebound.   ?Neurologic:  Alert and  oriented x4;  grossly normal neurologically. ? ?Impression/Plan: ?Kim Graham is here for an ERCP to be performed for stent removal ? ?Risks, benefits, limitations, and alternatives regarding  ERCP have been reviewed with the patient.  Questions have been answered.  All parties agreeable. ? ? ?[name], MD  03/10/2022, 11:13 AM ?

## 2022-03-11 ENCOUNTER — Encounter: Payer: Self-pay | Admitting: Gastroenterology

## 2022-03-12 LAB — SUSCEPTIBILITY, GRAM POS RODS

## 2022-03-12 LAB — NOCARDIA SUSCEPTIBILITY BROTH (NOCSBL)

## 2022-03-23 LAB — MISC LABCORP TEST (SEND OUT)
LabCorp test name: 182808
Labcorp test code: 182808

## 2022-04-07 ENCOUNTER — Telehealth: Payer: Self-pay

## 2022-04-07 ENCOUNTER — Ambulatory Visit: Payer: 59 | Attending: Infectious Diseases | Admitting: Infectious Diseases

## 2022-04-07 ENCOUNTER — Telehealth: Payer: Self-pay | Admitting: Family Medicine

## 2022-04-07 ENCOUNTER — Other Ambulatory Visit
Admission: RE | Admit: 2022-04-07 | Discharge: 2022-04-07 | Disposition: A | Discharge status: Home / Self Care | Payer: 59 | Attending: Infectious Diseases | Admitting: Infectious Diseases

## 2022-04-07 ENCOUNTER — Encounter: Payer: Self-pay | Admitting: Infectious Diseases

## 2022-04-07 VITALS — BP 126/67 | HR 97 | Temp 99.6°F | Wt 110.0 lb

## 2022-04-07 DIAGNOSIS — K7460 Unspecified cirrhosis of liver: Secondary | ICD-10-CM | POA: Diagnosis not present

## 2022-04-07 DIAGNOSIS — E119 Type 2 diabetes mellitus without complications: Secondary | ICD-10-CM | POA: Diagnosis not present

## 2022-04-07 DIAGNOSIS — Z960 Presence of urogenital implants: Secondary | ICD-10-CM | POA: Insufficient documentation

## 2022-04-07 DIAGNOSIS — Z9049 Acquired absence of other specified parts of digestive tract: Secondary | ICD-10-CM | POA: Insufficient documentation

## 2022-04-07 DIAGNOSIS — Z9689 Presence of other specified functional implants: Secondary | ICD-10-CM | POA: Insufficient documentation

## 2022-04-07 DIAGNOSIS — N390 Urinary tract infection, site not specified: Secondary | ICD-10-CM | POA: Diagnosis not present

## 2022-04-07 DIAGNOSIS — J449 Chronic obstructive pulmonary disease, unspecified: Secondary | ICD-10-CM | POA: Diagnosis not present

## 2022-04-07 DIAGNOSIS — R7881 Bacteremia: Secondary | ICD-10-CM | POA: Insufficient documentation

## 2022-04-07 DIAGNOSIS — Z09 Encounter for follow-up examination after completed treatment for conditions other than malignant neoplasm: Secondary | ICD-10-CM | POA: Insufficient documentation

## 2022-04-07 DIAGNOSIS — Z792 Long term (current) use of antibiotics: Secondary | ICD-10-CM | POA: Insufficient documentation

## 2022-04-07 DIAGNOSIS — Z87442 Personal history of urinary calculi: Secondary | ICD-10-CM | POA: Insufficient documentation

## 2022-04-07 DIAGNOSIS — N136 Pyonephrosis: Secondary | ICD-10-CM | POA: Insufficient documentation

## 2022-04-07 LAB — COMPREHENSIVE METABOLIC PANEL
ALT: 13 U/L (ref 0–44)
AST: 23 U/L (ref 15–41)
Albumin: 3.5 g/dL (ref 3.5–5.0)
Alkaline Phosphatase: 79 U/L (ref 38–126)
Anion gap: 11 (ref 5–15)
BUN: 15 mg/dL (ref 8–23)
CO2: 35 mmol/L — ABNORMAL HIGH (ref 22–32)
Calcium: 9.2 mg/dL (ref 8.9–10.3)
Chloride: 93 mmol/L — ABNORMAL LOW (ref 98–111)
Creatinine, Ser: 1.02 mg/dL — ABNORMAL HIGH (ref 0.44–1.00)
GFR, Estimated: 59 mL/min — ABNORMAL LOW (ref >60)
Glucose, Bld: 126 mg/dL — ABNORMAL HIGH (ref 70–99)
Potassium: 2.7 mmol/L — CL (ref 3.5–5.1)
Sodium: 139 mmol/L (ref 135–145)
Total Bilirubin: 0.6 mg/dL (ref 0.3–1.2)
Total Protein: 7.6 g/dL (ref 6.5–8.1)

## 2022-04-07 LAB — URINALYSIS, ROUTINE W REFLEX MICROSCOPIC
Bilirubin Urine: NEGATIVE
Glucose, UA: NEGATIVE mg/dL
Ketones, ur: NEGATIVE mg/dL
Nitrite: NEGATIVE
Protein, ur: 100 mg/dL — AB
RBC / HPF: >50 RBC/hpf — ABNORMAL HIGH (ref 0–5)
Specific Gravity, Urine: 1.012 (ref 1.005–1.030)
WBC, UA: >50 WBC/hpf — ABNORMAL HIGH (ref 0–5)
pH: 6 (ref 5.0–8.0)

## 2022-04-07 LAB — CBC WITH DIFFERENTIAL/PLATELET
Abs Immature Granulocytes: 0.04 10*3/uL (ref 0.00–0.07)
Basophils Absolute: 0 10*3/uL (ref 0.0–0.1)
Basophils Relative: 0 %
Eosinophils Absolute: 0.2 10*3/uL (ref 0.0–0.5)
Eosinophils Relative: 1 %
HCT: 38.4 % (ref 36.0–46.0)
Hemoglobin: 11.7 g/dL — ABNORMAL LOW (ref 12.0–15.0)
Immature Granulocytes: 0 %
Lymphocytes Relative: 10 %
Lymphs Abs: 1.3 10*3/uL (ref 0.7–4.0)
MCH: 26.4 pg (ref 26.0–34.0)
MCHC: 30.5 g/dL (ref 30.0–36.0)
MCV: 86.7 fL (ref 80.0–100.0)
Monocytes Absolute: 0.9 10*3/uL (ref 0.1–1.0)
Monocytes Relative: 8 %
Neutro Abs: 9.8 10*3/uL — ABNORMAL HIGH (ref 1.7–7.7)
Neutrophils Relative %: 81 %
Platelets: 241 10*3/uL (ref 150–400)
RBC: 4.43 MIL/uL (ref 3.87–5.11)
RDW: 15.8 % — ABNORMAL HIGH (ref 11.5–15.5)
WBC: 12.3 10*3/uL — ABNORMAL HIGH (ref 4.0–10.5)
nRBC: 0 % (ref 0.0–0.2)

## 2022-04-07 MED ORDER — AMOXICILLIN 875 MG PO TABS: 875.0000 mg | ORAL_TABLET | Freq: Two times a day (BID) | ORAL | 1 refills | Status: DC

## 2022-04-07 MED ORDER — POTASSIUM CHLORIDE CRYS ER 20 MEQ PO TBCR: 40.0000 meq | EXTENDED_RELEASE_TABLET | Freq: Every day | ORAL | 0 refills | Status: DC

## 2022-04-07 NOTE — Patient Instructions (Addendum)
You are ehre for follow up- you have not seen urologist- will make another appt You still have the kidney stent You are taking amoxicillin for the bacteria infection ( actinomyces) in the blood and kidney Will give you amoxiciillin 875 mg twice a day . Will do labs today

## 2022-04-07 NOTE — Telephone Encounter (Signed)
Lab called to report an abnormal lab of potassium 2.7. Pt was seen today by infectious disease.

## 2022-04-07 NOTE — Progress Notes (Signed)
E culture NAME: [name]  DOB: Aug 23, 1951  MRN: [id number]  Date/Time: 04/07/2022 11:31 AM   Here for follow up d for actinomyces bacteremia Last seen in April 2023 ? Kim Graham is a 71 y.o. female with a history of DM, COPD, Gall stones, cholecystectomy, renal stone,  is here for follow up for Actinomyces bacteremia Pt has a complicated history. She was in [facility] 12/09/2021 until 12/13/2021 for abdominal pain and found to have gallstones .  She underwent laparoscopic partial cholecystectomy on 12/09/2021.  This was complicated by biliary leak for which she underwent ERCP on 12/11/2021 and the entire bile duct was found to be dilated and hence had a biliary sphincterotomy and stent placement in the common bile duct.  Recently on 03/10/2022 she had the biliary stent exchange done by Dr. Servando Snare.  During that hospitalization in January/February she also had right ureteric stone obstruction and underwent cystoscopy with right ureteric stent placement by Dr. 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 CT abdomen showed rt double J stent without rt hydro, moderate left hydro with stone at the left UPJ, nodular liver and pneumobilia On 4/5 she underwent  cystoscopy, left ureteral stent placement and exchange of rt ureteral stent.There was pyonephrosis left kidney- urine culture taken at the procedure showed candida and lactobacillus She was on IV unasyn while in the hospital and was discharged on ceftriaxone to complete 6 weeks of IV -on 5/6 and then go on Po amoxicillin She also was sent on PO fluconazole for 2 weeks The source for actinomyces was thought to be either  the CBD VS pyonephrosis I saw her on 03/05/22 and  started amox/clav earlier as she left SNF earlier  to take care of her pets She has been taking amoxicillin/clavulanate till this morning.  She feels like she had fever  for 2 days and it broke last evening No cough or sob Some pain abdomen rt upper quadrant She missed the urologist appt on 03/09/22 She has no dysuria, no flank pain Had some brownish mucus rectally once but no diarrhea.    Past Medical History:  Diagnosis Date   COPD (chronic obstructive pulmonary disease) (HCC)    Diabetes mellitus type 2, diet-controlled (HCC) 04/15/2018  CBD dilatation Gallbladder stones status postcholecystectomy Left hydronephrosis Bilateral renal stones with stents in both ureters  Past Surgical History:  Procedure Laterality Date   CYSTOSCOPY W/ URETERAL STENT PLACEMENT Right 12/09/2021   Procedure: CYSTOSCOPY WITH RETROGRADE PYELOGRAM/URETERAL STENT PLACEMENT;  Surgeon: Riki Altes, MD;  Location: [facility] ORS;  Service: Urology;  Laterality: Right;   CYSTOSCOPY/URETEROSCOPY/HOLMIUM LASER/STENT PLACEMENT Bilateral 02/11/2022   Procedure: CYSTOSCOPY/URETEROSCOPY/HOLMIUM LASER/STENT PLACEMENT & RIGHT URETERAL STENT EXCHANGE;  Surgeon: Riki Altes, MD;  Location: [facility] ORS;  Service: Urology;  Laterality: Bilateral;   ENDOSCOPIC RETROGRADE CHOLANGIOPANCREATOGRAPHY (ERCP) WITH PROPOFOL N/A 12/11/2021   Procedure: ENDOSCOPIC RETROGRADE CHOLANGIOPANCREATOGRAPHY (ERCP) WITH PROPOFOL;  Surgeon: [name], MD;  Location: [facility] ENDOSCOPY;  Service: Endoscopy;  Laterality: N/A;   ERCP N/A 03/10/2022   Procedure: ENDOSCOPIC RETROGRADE CHOLANGIOPANCREATOGRAPHY (ERCP);  Surgeon: [name], MD;  Location: [facility] ENDOSCOPY;  Service: Endoscopy;  Laterality: N/A;  Stent removal    Social History   Socioeconomic History   Marital status: Single    Spouse name: Not on file   Number of children: Not on file   Years of education: Not on file  Highest education level: Not on file  Occupational History   Not on file  Tobacco Use   Smoking status: Former    Packs/day: 0.50    Years: 49.00    Pack years: 24.50    Types: Cigarettes    Quit date: 12/13/2018    Years since  quitting: 3.3    Passive exposure: Past   Smokeless tobacco: Never  Vaping Use   Vaping Use: Never used  Substance and Sexual Activity   Alcohol use: Not Currently    Comment: On occasion   Drug use: No   Sexual activity: Not Currently  Other Topics Concern   Not on file  Social History Narrative   Not on file   Social Determinants of Health   Financial Resource Strain: Medium Risk   Difficulty of Paying Living Expenses: Somewhat hard  Food Insecurity: Food Insecurity Present   Worried About Running Out of Food in the Last Year: Sometimes true   Ran Out of Food in the Last Year: Sometimes true  Transportation Needs: No Transportation Needs   Lack of Transportation (Medical): No   Lack of Transportation (Non-Medical): No  Physical Activity: Insufficiently Active   Days of Exercise per Week: 4 days   Minutes of Exercise per Session: 20 min  Stress: No Stress Concern Present   Feeling of Stress : Not at all  Social Connections: Moderately Isolated   Frequency of Communication with Friends and Family: More than three times a week   Frequency of Social Gatherings with Friends and Family: Three times a week   Attends Religious Services: Never   Active Member of Clubs or Organizations: No   Attends Music therapist: More than 4 times per year   Marital Status: Divorced  Human resources officer Violence: Not At Risk   Fear of Current or Ex-Partner: No   Emotionally Abused: No   Physically Abused: No   Sexually Abused: No    Family History  Problem Relation Age of Onset   Diabetes Mother    Cancer Father        Pancreatic   Diabetes Maternal Aunt    Diabetes Maternal Grandmother    Dementia Maternal Grandfather    Heart disease Maternal Grandfather    No Known Allergies I? Current Outpatient Medications  Medication Sig Dispense Refill   albuterol (VENTOLIN HFA) 108 (90 Base) MCG/ACT inhaler Inhale 2 puffs into the lungs every 6 (six) hours as needed for wheezing  or shortness of breath. 8.5 g 2   amoxicillin-clavulanate (AUGMENTIN) 875-125 MG tablet Take 1 tablet by mouth 2 (two) times daily. 60 tablet 1   ARIPiprazole (ABILIFY) 20 MG tablet Take 1 tablet (20 mg total) by mouth daily. 90 tablet 1   ascorbic acid (VITAMIN C) 500 MG tablet Take by mouth.     hydrochlorothiazide (HYDRODIURIL) 25 MG tablet Take 25 mg by mouth daily.     Multiple Vitamin (MULTI-VITAMIN) tablet Take 1 tablet by mouth daily.     polyethylene glycol (MIRALAX / GLYCOLAX) 17 g packet Take 17 g by mouth daily. 14 each 0   senna-docusate (SENOKOT-S) 8.6-50 MG tablet Take 1 tablet by mouth 2 (two) times daily. 30 tablet 0   umeclidinium-vilanterol (ANORO ELLIPTA) 62.5-25 MCG/ACT AEPB Inhale 1 puff into the lungs daily at 6 (six) AM. 1 each 12   potassium chloride SA (KLOR-CON M) 20 MEQ tablet Take 1 tablet (20 mEq total) by mouth daily for 4 days. 4 tablet 0   No  current facility-administered medications for this visit.     Abtx:  Anti-infectives (From admission, onward)    None       REVIEW OF SYSTEMS:  Const:  fever, negative chills, + weight loss Eyes: negative diplopia or visual changes, negative eye pain ENT: negative coryza, negative sore throat Resp: negative cough, hemoptysis, dyspnea Cards: negative for chest pain, palpitations, lower extremity edema GU: negative for frequency, dysuria and hematuria GI: Some  abdominal pain right upper quadrant, appetite fair no diarrhea,  constipation Skin: negative for rash and pruritus Heme: negative for easy bruising and gum/nose bleeding MS: weakness, fatigue Neurolo:negative for headaches, dizziness, vertigo, memory problems  Psych: negative for feelings of anxiety, depression  Endocrine: negative for thyroid, diabetes Allergy/Immunology- negative for any medication or food allergies ?  Objective:  VITALS:  BP 126/67   Pulse 97   Temp 99.6 F (37.6 C) (Oral)   Wt 110 lb (49.9 kg)   BMI 18.88 kg/m  PHYSICAL  EXAM:  General: Alert, cooperative, emaciated, Head: Normocephalic, without obvious abnormality, atraumatic. Eyes: Conjunctivae clear, anicteric sclerae. Pupils are equal ENT Nares normal. No drainage or sinus tenderness. Poor dentition.  No thrush Neck: Supple, symmetrical, no adenopathy, thyroid: non tender no carotid bruit and no JVD. Back: No CVA tenderness. Lungs: Clear to auscultation bilaterally. No Wheezing or Rhonchi. No rales. Heart: Regular rate and rhythm, no murmur, rub or gallop. Abdomen: Soft, . Bowel sounds normal. No masses, minimal tenderness right upper quadrant  Extremities: atraumatic, no cyanosis. No edema. No clubbing Skin: No rashes or lesions. Or bruising Lymph: Cervical, supraclavicular normal. Neurologic: Grossly non-focal Pertinent Labs Lab Results none   ? Impression/Recommendation Actinomyces bacteremia.  There is a very unusual organism to cause bacteremia.  The source could be the common bile duct versus kidney.  Patient completed nearly 5 weeks of IV ceftriaxone and is now on p.o. amoxicillin/clavulanate since May 1.  Today is her last day of the amoxicillin/clavulanate.    She was not feeling well the past 2 days and has had a fever she says even though she never checked her temperature.  Last night it broke and today this morning she is feeling better Will check blood culture, urine analysis and white count and CMP She has stents in both her kidneys as well as CBD She missed her appointment with urologist Call them and make an appointment for June 2. She will start taking amoxicillin stop amox-clav    cirrhosis of the liver  Recent gallstone and common bile duct stone with bile duct dilatation leading to cholangitis.  Underwent laparoscopic cholecystectomy.  Complicated by bilious leak.  Stent placement in the common bile duct.  Stent exchanged on Mar 10, 2022    right sided double-J internal ureteral stent secondary to stone  Complicated UTI  with moderate left hydronephrosis/pyelonephrosis due to 1.6 cm stone at the left UPJ.  During her admission in April she had cystoscopy and stent placement. She missed her follow-up appointment with urology on 03/09/2022. Another appointment is made for 6-23.  Discussed the management with the patient in great detail.   ? ___________________________________________________ Addendum Potassium 2.7 Creatinine 1.02 WBC 12.3 Hb 11.7 ( was 10 before) AST 23 ALT 13 Total bili 0.6 Alkaline phosphatase 79  Patient has an appointment with her PCP on Thursday.  We will inform her of the results. Will send prescription for potassium chloride till she sees her PCP.  Note:  This document was prepared using Systems analyst and  may include unintentional dictation errors.

## 2022-04-07 NOTE — Telephone Encounter (Signed)
Patient advised of low potassium, pick up potassium sent to the pharmacy and discuss this with her pcp on 04/09/22. Patient verbalized understanding. Donnie Panik T Pricilla Loveless

## 2022-04-07 NOTE — Telephone Encounter (Signed)
FYI to provider

## 2022-04-07 NOTE — Telephone Encounter (Signed)
-----   Message from Lynn Ito, MD sent at 04/07/2022  1:49 PM EDT ----- Can you please inform patient of very low Potassium of 2.7- I sent Prescription to [facility]- she will take ( 2 X 20 meq) a day for 4 days- She has an appt with ehr PCP on June 1st- she will have to discuss this with her. thx ----- Message ----- From: Leory Plowman, Lab [facility] [place] Sent: 04/07/2022  12:33 PM EDT To: Lynn Ito, MD

## 2022-04-08 LAB — URINE CULTURE: Culture: <10000 — AB

## 2022-04-09 ENCOUNTER — Ambulatory Visit (INDEPENDENT_AMBULATORY_CARE_PROVIDER_SITE_OTHER): Payer: 59 | Admitting: Family Medicine

## 2022-04-09 ENCOUNTER — Encounter: Payer: Self-pay | Admitting: Family Medicine

## 2022-04-09 VITALS — BP 111/61 | HR 69 | Temp 98.6°F | Ht 62.0 in | Wt 109.6 lb

## 2022-04-09 DIAGNOSIS — E876 Hypokalemia: Secondary | ICD-10-CM | POA: Diagnosis not present

## 2022-04-09 DIAGNOSIS — J441 Chronic obstructive pulmonary disease with (acute) exacerbation: Secondary | ICD-10-CM | POA: Diagnosis not present

## 2022-04-09 DIAGNOSIS — R7301 Impaired fasting glucose: Secondary | ICD-10-CM | POA: Diagnosis not present

## 2022-04-09 DIAGNOSIS — F339 Major depressive disorder, recurrent, unspecified: Secondary | ICD-10-CM | POA: Diagnosis not present

## 2022-04-09 DIAGNOSIS — R609 Edema, unspecified: Secondary | ICD-10-CM

## 2022-04-09 DIAGNOSIS — E119 Type 2 diabetes mellitus without complications: Secondary | ICD-10-CM

## 2022-04-09 DIAGNOSIS — Z0000 Encounter for general adult medical examination without abnormal findings: Secondary | ICD-10-CM | POA: Diagnosis not present

## 2022-04-09 LAB — CULTURE, BLOOD (ROUTINE X 2)

## 2022-04-09 LAB — BAYER DCA HB A1C WAIVED: HB A1C (BAYER DCA - WAIVED): 5.2 % (ref 4.8–5.6)

## 2022-04-09 NOTE — Assessment & Plan Note (Signed)
Does not have diabetes. A1c normal at 5.2. Continue diet and exercise. Call with any concerns.

## 2022-04-09 NOTE — Progress Notes (Signed)
BP 111/61 (BP Location: Left Arm, Cuff Size: Small)   Pulse 69   Temp 98.6 F (37 C) (Oral)   Ht _0  (1.575 m)   Wt 109 lb 9.6 oz (49.7 kg)   SpO2 97%   BMI 20.05 kg/m    Subjective:    Patient ID: Kim Graham, female    DOB: 1951/07/30, 71 y.o.   MRN: [id number]  HPI: Kim Graham is a 71 y.o. female  Chief Complaint  Patient presents with   Diabetes   Depression   COPD   Hypokalemia- just stared her potassium yesterday.   Impaired Fasting Glucose HbA1C:  Lab Results  Component Value Date   HGBA1C 5.2 04/09/2022   Duration of elevated blood sugar: chronic  Polydipsia: no Polyuria: no Weight change: no Visual disturbance: no Glucose Monitoring: no    Accucheck frequency: Not Checking Diabetic Education: Not Completed Family history of diabetes: no  HYPERTENSION / [place] Satisfied with current treatment? yes Duration of hypertension: chronic BP monitoring frequency: not checking BP medication side effects: no Past BP meds: HCTZ Duration of hyperlipidemia: chronic Cholesterol medication side effects: no Cholesterol supplements: none Past cholesterol medications: none Medication compliance: excellent compliance Aspirin: no Recent stressors: yes Recurrent headaches: no Visual changes: no Palpitations: no Dyspnea: no Chest pain: no Lower extremity edema: no Dizzy/lightheaded: no  COPD COPD status: controlled Satisfied with current treatment?: no Oxygen use: no Dyspnea frequency: occasionally Cough frequency: occasionally Rescue inhaler frequency:  never Limitation of activity: no Productive cough: None Pneumovax: Up to Date Influenza: Up to Date  DEPRESSION Mood status: exacerbated Satisfied with current treatment?: no Symptom severity: moderate  Duration of current treatment : chronic Side effects: no Medication compliance: good compliance Psychotherapy/counseling: no  Previous psychiatric medications: abilify Depressed  mood: yes Anxious mood: yes Anhedonia: no Significant weight loss or gain: yes Insomnia: yes  Fatigue: yes Feelings of worthlessness or guilt: yes Impaired concentration/indecisiveness: yes Suicidal ideations: no Hopelessness: no Crying spells: yes    04/09/2022    2:57 PM 04/07/2022   10:58 AM 03/05/2022   11:21 AM 01/08/2022    1:55 PM 12/29/2021    1:51 PM  Depression screen PHQ 2/9  Decreased Interest 1 0 0 0 0  Down, Depressed, Hopeless 1 0 0 0 0  PHQ - 2 Score 2 0 0 0 0  Altered sleeping 0   0 0  Tired, decreased energy _1 Change in appetite 1   0 1  Feeling bad or failure about yourself  0   0 0  Trouble concentrating 1   0 0  Moving slowly or fidgety/restless 0   0 0  Suicidal thoughts 0   0 0  PHQ-9 Score _2 Difficult doing work/chores Not difficult at all        Relevant past medical, surgical, family and social history reviewed and updated as indicated. Interim medical history since our last visit reviewed. Allergies and medications reviewed and updated.  Review of Systems  Constitutional: Negative.   HENT: Negative.    Eyes: Negative.   Respiratory: Negative.    Cardiovascular: Negative.   Gastrointestinal: Negative.   Endocrine: Negative.   Genitourinary: Negative.   Musculoskeletal: Negative.   Skin: Negative.   Allergic/Immunologic: Negative.   Neurological: Negative.   Hematological: Negative.   Psychiatric/Behavioral:  Positive for dysphoric mood and sleep disturbance. Negative for agitation, behavioral problems, confusion, decreased concentration, hallucinations, Jarnagin-injury and suicidal  ideas. The patient is nervous/anxious. The patient is not hyperactive.    Per HPI unless specifically indicated above     Objective:    BP 111/61 (BP Location: Left Arm, Cuff Size: Small)   Pulse 69   Temp 98.6 F (37 C) (Oral)   Ht _0  (1.575 m)   Wt 109 lb 9.6 oz (49.7 kg)   SpO2 97%   BMI 20.05 kg/m   Wt Readings from Last 3 Encounters:   04/09/22 109 lb 9.6 oz (49.7 kg)  04/07/22 110 lb (49.9 kg)  03/10/22 104 lb 15 oz (47.6 kg)    Physical Exam Vitals and nursing note reviewed.  Constitutional:      General: She is not in acute distress.    Appearance: Normal appearance. She is not ill-appearing, toxic-appearing or diaphoretic.  HENT:     Head: Normocephalic and atraumatic.     Right Ear: Tympanic membrane, ear canal and external ear normal. There is no impacted cerumen.     Left Ear: Tympanic membrane, ear canal and external ear normal. There is no impacted cerumen.     Nose: Nose normal. No congestion or rhinorrhea.     Mouth/Throat:     Mouth: Mucous membranes are moist.     Pharynx: Oropharynx is clear. No oropharyngeal exudate or posterior oropharyngeal erythema.  Eyes:     General: No scleral icterus.       Right eye: No discharge.        Left eye: No discharge.     Extraocular Movements: Extraocular movements intact.     Conjunctiva/sclera: Conjunctivae normal.     Pupils: Pupils are equal, round, and reactive to light.  Neck:     Vascular: No carotid bruit.  Cardiovascular:     Rate and Rhythm: Normal rate and regular rhythm.     Pulses: Normal pulses.     Heart sounds: No murmur heard.   No friction rub. No gallop.  Pulmonary:     Effort: Pulmonary effort is normal. No respiratory distress.     Breath sounds: Normal breath sounds. No stridor. No wheezing, rhonchi or rales.  Chest:     Chest wall: No tenderness.  Abdominal:     General: Abdomen is flat. Bowel sounds are normal. There is no distension.     Palpations: Abdomen is soft. There is no mass.     Tenderness: There is no abdominal tenderness. There is no right CVA tenderness, left CVA tenderness, guarding or rebound.     Hernia: No hernia is present.  Genitourinary:    Comments: Breast and pelvic exams deferred with shared decision making Musculoskeletal:        General: No swelling, tenderness, deformity or signs of injury.      Cervical back: Normal range of motion and neck supple. No rigidity. No muscular tenderness.     Right lower leg: No edema.     Left lower leg: No edema.  Lymphadenopathy:     Cervical: No cervical adenopathy.  Skin:    General: Skin is warm and dry.     Capillary Refill: Capillary refill takes less than 2 seconds.     Coloration: Skin is not jaundiced or pale.     Findings: No bruising, erythema, lesion or rash.  Neurological:     General: No focal deficit present.     Mental Status: She is alert and oriented to person, place, and time. Mental status is at baseline.     Cranial Nerves:  No cranial nerve deficit.     Sensory: No sensory deficit.     Motor: No weakness.     Coordination: Coordination normal.     Gait: Gait normal.     Deep Tendon Reflexes: Reflexes normal.  Psychiatric:        Mood and Affect: Mood normal.        Behavior: Behavior normal.        Thought Content: Thought content normal.        Judgment: Judgment normal.    Results for orders placed or performed in visit on 04/09/22  CBC with Differential/Platelet  Result Value Ref Range   WBC 7.5 3.4 - 10.8 x10E3/uL   RBC 4.09 3.77 - 5.28 x10E6/uL   Hemoglobin 10.7 (L) 11.1 - 15.9 g/dL   Hematocrit 34.3 34.0 - 46.6 %   MCV 84 79 - 97 fL   MCH 26.2 (L) 26.6 - 33.0 pg   MCHC 31.2 (L) 31.5 - 35.7 g/dL   RDW 14.5 11.7 - 15.4 %   Platelets 233 150 - 450 x10E3/uL   Neutrophils 56 Not Estab. %   Lymphs 31 Not Estab. %   Monocytes 10 Not Estab. %   Eos 3 Not Estab. %   Basos 0 Not Estab. %   Neutrophils Absolute 4.1 1.4 - 7.0 x10E3/uL   Lymphocytes Absolute 2.3 0.7 - 3.1 x10E3/uL   Monocytes Absolute 0.7 0.1 - 0.9 x10E3/uL   EOS (ABSOLUTE) 0.2 0.0 - 0.4 x10E3/uL   Basophils Absolute 0.0 0.0 - 0.2 x10E3/uL   Immature Granulocytes 0 Not Estab. %   Immature Grans (Abs) 0.0 0.0 - 0.1 x10E3/uL  Bayer DCA Hb A1c Waived  Result Value Ref Range   HB A1C (BAYER DCA - WAIVED) 5.2 4.8 - 5.6 %  Comprehensive metabolic  panel  Result Value Ref Range   Glucose 94 70 - 99 mg/dL   BUN 13 8 - 27 mg/dL   Creatinine, Ser 0.87 0.57 - 1.00 mg/dL   eGFR 72 >59 mL/min/1.73   BUN/Creatinine Ratio 15 12 - 28   Sodium 142 134 - 144 mmol/L   Potassium 3.1 (L) 3.5 - 5.2 mmol/L   Chloride 95 (L) 96 - 106 mmol/L   CO2 30 (H) 20 - 29 mmol/L   Calcium 9.2 8.7 - 10.3 mg/dL   Total Protein 6.5 6.0 - 8.5 g/dL   Albumin 3.9 3.8 - 4.8 g/dL   Globulin, Total 2.6 1.5 - 4.5 g/dL   Albumin/Globulin Ratio 1.5 1.2 - 2.2   Bilirubin Total <0.2 0.0 - 1.2 mg/dL   Alkaline Phosphatase 94 44 - 121 IU/L   AST 20 0 - 40 IU/L   ALT 11 0 - 32 IU/L  Lipid Panel w/o Chol/HDL Ratio  Result Value Ref Range   Cholesterol, Total 128 100 - 199 mg/dL   Triglycerides 140 0 - 149 mg/dL   HDL 44 >39 mg/dL   VLDL Cholesterol Cal 24 5 - 40 mg/dL   LDL Chol Calc (NIH) 60 0 - 99 mg/dL      Assessment & Plan:   Problem List Items Addressed This Visit       Respiratory   Chronic obstructive pulmonary disease (HCC)    Under good control on current regimen. Continue current regimen. Continue to monitor. Call with any concerns. Refills given. Labs drawn today.         Relevant Medications   albuterol (VENTOLIN HFA) 108 (90 Base) MCG/ACT inhaler   Other Relevant  Orders   CBC with Differential/Platelet (Completed)     Endocrine   IFG (impaired fasting glucose)    Does not have diabetes. A1c normal at 5.2. Continue diet and exercise. Call with any concerns.        Relevant Orders   Bayer DCA Hb A1c Waived (Completed)   Comprehensive metabolic panel (Completed)   Lipid Panel w/o Chol/HDL Ratio (Completed)     Other   Depression, recurrent (HCC)    Not under good control. Will increase her abilify to 29m and recheck 2 weeks. Call with any concerns.        Relevant Orders   CBC with Differential/Platelet (Completed)   Other Visit Diagnoses     Routine general medical examination at a health care facility    -  Primary    Vaccines up to date. Screening labs checked today. Cologuard ordered. Mammogram and DEXA ordered. Await results. Treat as needed.    Hypokalemia       Has been on potassium. Stop HCTZ. Rechecking labs today. Await results.    Relevant Orders   Comprehensive metabolic panel (Completed)   Peripheral edema       BP running low and hypokalemic. Stop HCTZ. Recheckign labs. Await results. Treat as needed.         Follow up plan: Return in about 3 weeks (around 04/30/2022).

## 2022-04-10 ENCOUNTER — Ambulatory Visit: Payer: 59 | Admitting: Urology

## 2022-04-10 ENCOUNTER — Ambulatory Visit: Payer: 59 | Admitting: Family Medicine

## 2022-04-10 LAB — LIPID PANEL W/O CHOL/HDL RATIO
Cholesterol, Total: 128 mg/dL (ref 100–199)
HDL: 44 mg/dL (ref >39)
LDL Chol Calc (NIH): 60 mg/dL (ref 0–99)
Triglycerides: 140 mg/dL (ref 0–149)
VLDL Cholesterol Cal: 24 mg/dL (ref 5–40)

## 2022-04-10 LAB — COMPREHENSIVE METABOLIC PANEL
ALT: 11 IU/L (ref 0–32)
AST: 20 IU/L (ref 0–40)
Albumin/Globulin Ratio: 1.5 (ref 1.2–2.2)
Albumin: 3.9 g/dL (ref 3.8–4.8)
Alkaline Phosphatase: 94 IU/L (ref 44–121)
BUN/Creatinine Ratio: 15 (ref 12–28)
BUN: 13 mg/dL (ref 8–27)
Bilirubin Total: <0.2 mg/dL (ref 0.0–1.2)
CO2: 30 mmol/L — ABNORMAL HIGH (ref 20–29)
Calcium: 9.2 mg/dL (ref 8.7–10.3)
Chloride: 95 mmol/L — ABNORMAL LOW (ref 96–106)
Creatinine, Ser: 0.87 mg/dL (ref 0.57–1.00)
Globulin, Total: 2.6 g/dL (ref 1.5–4.5)
Glucose: 94 mg/dL (ref 70–99)
Potassium: 3.1 mmol/L — ABNORMAL LOW (ref 3.5–5.2)
Sodium: 142 mmol/L (ref 134–144)
Total Protein: 6.5 g/dL (ref 6.0–8.5)
eGFR: 72 mL/min/{1.73_m2} (ref >59)

## 2022-04-10 LAB — CBC WITH DIFFERENTIAL/PLATELET
Basophils Absolute: 0 10*3/uL (ref 0.0–0.2)
Basos: 0 %
EOS (ABSOLUTE): 0.2 10*3/uL (ref 0.0–0.4)
Eos: 3 %
Hematocrit: 34.3 % (ref 34.0–46.6)
Hemoglobin: 10.7 g/dL — ABNORMAL LOW (ref 11.1–15.9)
Immature Grans (Abs): 0 10*3/uL (ref 0.0–0.1)
Immature Granulocytes: 0 %
Lymphocytes Absolute: 2.3 10*3/uL (ref 0.7–3.1)
Lymphs: 31 %
MCH: 26.2 pg — ABNORMAL LOW (ref 26.6–33.0)
MCHC: 31.2 g/dL — ABNORMAL LOW (ref 31.5–35.7)
MCV: 84 fL (ref 79–97)
Monocytes Absolute: 0.7 10*3/uL (ref 0.1–0.9)
Monocytes: 10 %
Neutrophils Absolute: 4.1 10*3/uL (ref 1.4–7.0)
Neutrophils: 56 %
Platelets: 233 10*3/uL (ref 150–450)
RBC: 4.09 x10E6/uL (ref 3.77–5.28)
RDW: 14.5 % (ref 11.7–15.4)
WBC: 7.5 10*3/uL (ref 3.4–10.8)

## 2022-04-12 ENCOUNTER — Encounter: Payer: Self-pay | Admitting: Family Medicine

## 2022-04-12 LAB — CULTURE, BLOOD (ROUTINE X 2)
Culture: NO GROWTH
Culture: NO GROWTH
Special Requests: ADEQUATE
Special Requests: ADEQUATE

## 2022-04-12 MED ORDER — ARIPIPRAZOLE 30 MG PO TABS
30.0000 mg | ORAL_TABLET | Freq: Every day | ORAL | 1 refills | Status: DC
Start: 2022-04-12 — End: 2022-05-20

## 2022-04-12 MED ORDER — ALBUTEROL SULFATE HFA 108 (90 BASE) MCG/ACT IN AERS: 2.0000 | INHALATION_SPRAY | Freq: Four times a day (QID) | RESPIRATORY_TRACT | 2 refills | Status: DC | PRN

## 2022-04-12 NOTE — Assessment & Plan Note (Signed)
Not under good control. Will increase her abilify to 30mg  and recheck 2 weeks. Call with any concerns.

## 2022-04-12 NOTE — Assessment & Plan Note (Signed)
Under good control on current regimen. Continue current regimen. Continue to monitor. Call with any concerns. Refills given. Labs drawn today.   

## 2022-04-15 ENCOUNTER — Telehealth: Payer: Self-pay

## 2022-04-15 NOTE — Telephone Encounter (Signed)
Left message on voicemail for pt to r/t my call to schedule ERCP in August

## 2022-04-17 ENCOUNTER — Other Ambulatory Visit: Payer: Self-pay

## 2022-04-17 ENCOUNTER — Ambulatory Visit (INDEPENDENT_AMBULATORY_CARE_PROVIDER_SITE_OTHER): Payer: 59 | Admitting: Urology

## 2022-04-17 ENCOUNTER — Encounter: Payer: Self-pay | Admitting: Urology

## 2022-04-17 VITALS — BP 148/63 | HR 103 | Ht 62.0 in | Wt 110.0 lb

## 2022-04-17 DIAGNOSIS — Z4689 Encounter for fitting and adjustment of other specified devices: Secondary | ICD-10-CM

## 2022-04-17 DIAGNOSIS — N200 Calculus of kidney: Secondary | ICD-10-CM | POA: Diagnosis not present

## 2022-04-17 DIAGNOSIS — N201 Calculus of ureter: Secondary | ICD-10-CM | POA: Diagnosis not present

## 2022-04-17 LAB — URINALYSIS, COMPLETE
Bilirubin, UA: NEGATIVE
Glucose, UA: NEGATIVE
Ketones, UA: NEGATIVE
Nitrite, UA: NEGATIVE
Specific Gravity, UA: 1.025 (ref 1.005–1.030)
Urobilinogen, Ur: 0.2 mg/dL (ref 0.2–1.0)
pH, UA: 6 (ref 5.0–7.5)

## 2022-04-17 LAB — MICROSCOPIC EXAMINATION
RBC, Urine: >30 /hpf — AB (ref 0–2)
WBC, UA: >30 /hpf — AB (ref 0–5)

## 2022-04-17 NOTE — Telephone Encounter (Signed)
Pt r/t my call to schedule ERCP... Scheduled for August 15th... instructions gone over with pt and mailed

## 2022-04-17 NOTE — Progress Notes (Unsigned)
04/17/2022 12:29 PM   Kim Graham 1951/03/15 [id number]  Referring provider: Valerie Roys, DO [place],  [place]  [address]  Chief Complaint  Patient presents with   Follow-up    HPI: 71 y.o. female presents for follow-up.  Initially had a right ureteral stent placed for a 6 mm ureteral calculus 12/09/2021 which was obstructing and incidentally noted on CT at the time of admission of acute cholecystitis She failed to follow-up for definitive stone treatment and readmitted early April 2023 with Phillip Heal positive bacteremia and fever to 103 degrees.  Right ureteral stent was in good position.  A previously noted nonobstructing renal calculus had migrated to the UPJ and associated with left hydronephrosis Underwent replacement of her right ureteral stent and urgent left ureteral stent placement with purulent urine obtained on the left and intraoperative urine culture positive for Candida and lactobacillus Bacteremia was identified as Actinomyces though the etiology unclear   PMH: Past Medical History:  Diagnosis Date   Acute cholecystitis 12/09/2021   Bile leak    COPD (chronic obstructive pulmonary disease) ([place])    Diabetes mellitus type 2, diet-controlled ([place]) 04/15/2018    Surgical History: Past Surgical History:  Procedure Laterality Date   CYSTOSCOPY W/ URETERAL STENT PLACEMENT Right 12/09/2021   Procedure: CYSTOSCOPY WITH RETROGRADE PYELOGRAM/URETERAL STENT PLACEMENT;  Surgeon: Abbie Sons, MD;  Location: [facility] ORS;  Service: Urology;  Laterality: Right;   CYSTOSCOPY/URETEROSCOPY/HOLMIUM LASER/STENT PLACEMENT Bilateral 02/11/2022   Procedure: CYSTOSCOPY/URETEROSCOPY/HOLMIUM LASER/STENT PLACEMENT & RIGHT URETERAL STENT EXCHANGE;  Surgeon: Abbie Sons, MD;  Location: [facility] ORS;  Service: Urology;  Laterality: Bilateral;   ENDOSCOPIC RETROGRADE CHOLANGIOPANCREATOGRAPHY (ERCP) WITH PROPOFOL N/A 12/11/2021   Procedure: ENDOSCOPIC RETROGRADE CHOLANGIOPANCREATOGRAPHY  (ERCP) WITH PROPOFOL;  Surgeon: Lucilla Lame, MD;  Location: [facility] ENDOSCOPY;  Service: Endoscopy;  Laterality: N/A;   ERCP N/A 03/10/2022   Procedure: ENDOSCOPIC RETROGRADE CHOLANGIOPANCREATOGRAPHY (ERCP);  Surgeon: Lucilla Lame, MD;  Location: [facility] ENDOSCOPY;  Service: Endoscopy;  Laterality: N/A;  Stent removal    Home Medications:  Allergies as of 04/17/2022   No Known Allergies      Medication List        Accurate as of April 17, 2022 12:29 PM. If you have any questions, ask your nurse or doctor.          albuterol 108 (90 Base) MCG/ACT inhaler Commonly known as: VENTOLIN HFA Inhale 2 puffs into the lungs every 6 (six) hours as needed for wheezing or shortness of breath.   amoxicillin 875 MG tablet Commonly known as: AMOXIL Take 1 tablet (875 mg total) by mouth 2 (two) times daily.   ARIPiprazole 30 MG tablet Commonly known as: ABILIFY Take 1 tablet (30 mg total) by mouth daily.   ascorbic acid 500 MG tablet Commonly known as: VITAMIN C Take by mouth.   Multi-Vitamin tablet Take 1 tablet by mouth daily.   polyethylene glycol 17 g packet Commonly known as: MIRALAX / GLYCOLAX Take 17 g by mouth daily.   potassium chloride SA 20 MEQ tablet Commonly known as: KLOR-CON M Take 2 tablets (40 mEq total) by mouth daily for 4 days.   senna-docusate 8.6-50 MG tablet Commonly known as: Senokot-S Take 1 tablet by mouth 2 (two) times daily.   umeclidinium-vilanterol 62.5-25 MCG/ACT Aepb Commonly known as: ANORO ELLIPTA Inhale 1 puff into the lungs daily at 6 (six) AM.        Allergies: No Known Allergies  Family History: Family History  Problem Relation Age  of Onset   Diabetes Mother    Cancer Father        Pancreatic   Diabetes Maternal Aunt    Diabetes Maternal Grandmother    Dementia Maternal Grandfather    Heart disease Maternal Grandfather     Social History:  reports that she quit smoking about 3 years ago. Her smoking use included cigarettes. She has a  24.50 pack-year smoking history. She has been exposed to tobacco smoke. She has never used smokeless tobacco. She reports that she does not currently use alcohol. She reports that she does not use drugs.   Physical Exam: BP (!) 148/63   Pulse (!) 103   Ht 5\' 2"  (1.575 m)   Wt 110 lb (49.9 kg)   BMI 20.12 kg/m   Constitutional:  Alert and oriented, No acute distress. HEENT: [place] AT Respiratory: Normal respiratory effort, no increased work of breathing. Neurologic: Grossly intact, no focal deficits, moving all 4 extremities. Psychiatric: Normal mood and affect.   Assessment & Plan:   71 y.o. female with an obstructing right ureteral calculus and 16 mm left renal pelvic calculus with additional nonobstructing left renal calculi We discussed treatment of her urinary tract calculi and will schedule bilateral ureteroscopy with laser lithotripsy.  Her left side may need to be a staged procedure.  The procedure was discussed in detail including potential risks of bleeding, infection/sepsis, ureteral/renal injury.  The need for replacement of her ureteral stents temporarily was also discussed.  All questions were answered and she desires to schedule Voided urine today was sent for preoperative culture ***Order to [facility], [place] [address], [place], [place] [address] [phone]

## 2022-04-20 ENCOUNTER — Other Ambulatory Visit: Payer: Self-pay | Admitting: Urology

## 2022-04-20 ENCOUNTER — Ambulatory Visit: Payer: 59 | Admitting: Family Medicine

## 2022-04-20 DIAGNOSIS — N201 Calculus of ureter: Secondary | ICD-10-CM

## 2022-04-20 DIAGNOSIS — N200 Calculus of kidney: Secondary | ICD-10-CM

## 2022-04-20 LAB — CULTURE, URINE COMPREHENSIVE

## 2022-04-20 NOTE — Progress Notes (Unsigned)
Surgical Physician Order Form [facility]  * Scheduling expectation : ASAP  *Length of Case: 90 minutes  *Clearance needed: Per Gaspar Bidding  *Anticoagulation Instructions: N/A  *Aspirin Instructions: N/A  *Post-op visit Date/Instructions:  1 week cysto stent removal  *Diagnosis:  Right ureteral calculus/left nephrolithiasis  *Procedure: Bilateral  Ureteroscopy w/laser lithotripsy & stent exchange [id number])   Additional orders: N/A  -Admit type: OUTpatient  -Anesthesia: Choice  -VTE Prophylaxis Standing Order SCD's       Other:   -Standing Lab Orders Per Anesthesia    Lab other: UA&Urine Culture ordered 6/9  -Standing Test orders EKG/Chest x-ray per Anesthesia       Test other:   - Medications: Based on preop urine culture  -Other orders:  N/A

## 2022-04-22 ENCOUNTER — Encounter: Payer: Self-pay | Admitting: Urgent Care

## 2022-04-22 ENCOUNTER — Telehealth: Payer: Self-pay

## 2022-04-22 NOTE — Telephone Encounter (Signed)
I spoke with Kim Graham. We have discussed possible surgery dates and Tuesday June 27th, 2023 was agreed upon by all parties. Patient given information about surgery date, what to expect pre-operatively and post operatively.  We discussed that a Pre-Admission Testing office will be calling to set up the pre-op visit that will take place prior to surgery, and that these appointments are typically done over the phone with a Pre-Admissions RN.  Informed patient that our office will communicate any additional care to be provided after surgery. Patients questions or concerns were discussed during our call. Advised to call our office should there be any additional information, questions or concerns that arise. Patient verbalized understanding.

## 2022-04-22 NOTE — Progress Notes (Signed)
[facility] Posting Form   Surgery Date/Time: Date: 05/05/2022  Surgeon: Dr. Irineo Axon, MD  Surgery Location: Day Surgery  Inpt ( No  )   Outpt (Yes)   Obs ( No  )   Diagnosis: N20.1 Right Ureteral Stone, N20.0 Left Nephrolithiasis  -CPT: 76720  Surgery: Bilateral Ureteroscopy with laser lithotripsy and stent placement  Stop Anticoagulations: N/A  Cardiac/Medical/Pulmonary Clearance needed: Possibly, Sent to Quentin Mulling, NP with Perioperative Services   *Orders entered into EPIC  Date: 04/22/22   *Case booked in EPIC  Date: 04/21/2022  *Notified pt of Surgery: Date: 04/21/2022  PRE-OP UA & CX: yes, obtained in clinic on 04/17/2022  *Placed into Prior Authorization Work Angela Nevin Date: 04/22/22   Assistant/laser/rep:No

## 2022-04-29 ENCOUNTER — Encounter
Admission: RE | Admit: 2022-04-29 | Discharge: 2022-04-29 | Disposition: A | Discharge status: Home / Self Care | Payer: 59 | Source: Ambulatory Visit | Attending: Urology | Admitting: Urology

## 2022-04-29 VITALS — Ht 64.0 in | Wt 110.0 lb

## 2022-04-29 DIAGNOSIS — Z01812 Encounter for preprocedural laboratory examination: Secondary | ICD-10-CM

## 2022-04-29 HISTORY — DX: Essential (primary) hypertension: I10

## 2022-04-29 HISTORY — DX: Inflammatory liver disease, unspecified: K75.9

## 2022-04-29 HISTORY — DX: Personal history of urinary calculi: Z87.442

## 2022-04-29 NOTE — Patient Instructions (Addendum)
Your procedure is scheduled on: Tuesday May 05, 2022. Report to Day Surgery inside [facility] 2nd floor, stop by admissions desk before getting on elevator.  To find out your arrival time please call [phone] between 1PM - 3PM on Monday May 04, 2022.  Remember: Instructions that are not followed completely may result in serious medical risk,  up to and including death, or upon the discretion of your surgeon and anesthesiologist your  surgery may need to be rescheduled.     _X__ 1. Do not eat food or drink fluids after midnight the night before your procedure.                 No chewing gum or hard candies.   __X__2.  On the morning of surgery brush your teeth with toothpaste and water, you                may rinse your mouth with mouthwash if you wish.  Do not swallow any toothpaste or mouthwash.     _X__ 3.  No Alcohol for 24 hours before or after surgery.   _X__ 4.  Do Not Smoke or use e-cigarettes For 24 Hours Prior to Your Surgery.                 Do not use any chewable tobacco products for at least 6 hours prior to                 Surgery.  _X__  5.  Do not use any recreational drugs (marijuana, cocaine, heroin, ecstasy, MDMA or other)                For at least one week prior to your surgery.  Combination of these drugs with anesthesia                May have life threatening results.  ____  6.  Bring all medications with you on the day of surgery if instructed.   __X__  7.  Notify your doctor if there is any change in your medical condition      (cold, fever, infections).     Do not wear jewelry, make-up, hairpins, clips or nail polish. Do not wear lotions, powders, or perfumes. You may wear deodorant. Do not shave 48 hours prior to surgery. Men may shave face and neck. Do not bring valuables to the hospital.    [facility] is not responsible for any belongings or valuables.  Contacts, dentures or bridgework may not be worn into  surgery. Leave your suitcase in the car. After surgery it may be brought to your room. For patients admitted to the hospital, discharge time is determined by your treatment team.   Patients discharged the day of surgery will not be allowed to drive home.   Make arrangements for someone to be with you for the first 24 hours of your Same Day Discharge.   __X__ Take these medicines the morning of surgery with A SIP OF WATER:    1. ARIPiprazole (ABILIFY) 30 MG   2.   3.   4.  5.  6.  ____ Fleet Enema (as directed)   ____ Use CHG Soap (or wipes) as directed  ____ Use Benzoyl Peroxide Gel as instructed  __X__ Use inhalers on the day of surgery  albuterol (VENTOLIN HFA) 108 (90 Base) MCG/ACT inhaler  umeclidinium-vilanterol (ANORO ELLIPTA) 62.5-25 MCG/ACT AEPB  ____ Stop metformin 2 days prior to surgery    ____  Take 1/2 of usual insulin dose the night before surgery. No insulin the morning          of surgery.   ____ Call your PCP, cardiologist, or Pulmonologist if taking Coumadin/Plavix/aspirin and ask when to stop before your surgery.   __X__ One Week prior to surgery- Stop Anti-inflammatories such as Ibuprofen, Aleve, Advil, Motrin, meloxicam (MOBIC), diclofenac, etodolac, ketorolac, Toradol, Daypro, piroxicam, Goody's or BC powders. OK TO USE TYLENOL IF NEEDED   __X__ Stop supplements until after surgery.    ____ Bring C-Pap to the hospital.    If you have any questions regarding your pre-procedure instructions,  Please call Pre-admit Testing at [phone]

## 2022-04-30 ENCOUNTER — Inpatient Hospital Stay: Admission: RE | Admit: 2022-04-30 | Payer: 59 | Source: Ambulatory Visit

## 2022-04-30 ENCOUNTER — Encounter: Payer: Self-pay | Admitting: Urgent Care

## 2022-04-30 ENCOUNTER — Ambulatory Visit: Payer: 59 | Admitting: Infectious Diseases

## 2022-05-01 ENCOUNTER — Inpatient Hospital Stay: Admission: RE | Admit: 2022-05-01 | Payer: 59 | Source: Ambulatory Visit

## 2022-05-04 ENCOUNTER — Telehealth: Payer: Self-pay | Admitting: Urology

## 2022-05-04 ENCOUNTER — Encounter
Admission: RE | Admit: 2022-05-04 | Discharge: 2022-05-04 | Disposition: A | Discharge status: Home / Self Care | Payer: 59 | Source: Ambulatory Visit | Attending: Urology | Admitting: Urology

## 2022-05-04 DIAGNOSIS — Z01812 Encounter for preprocedural laboratory examination: Secondary | ICD-10-CM

## 2022-05-04 LAB — POTASSIUM: Potassium: 3.9 mmol/L (ref 3.5–5.1)

## 2022-05-04 NOTE — Telephone Encounter (Signed)
Pt called to find out what time she had to be here for surgery tomorrow and it's 6:30.  She said she told them before we talked about her surgery that she couldn't be here before 8:00 due to transportation issues.

## 2022-05-05 ENCOUNTER — Ambulatory Visit
Admission: RE | Admit: 2022-05-05 | Discharge: 2022-05-05 | Disposition: A | Discharge status: Home / Self Care | Payer: 59 | Attending: Urology | Admitting: Urology

## 2022-05-05 ENCOUNTER — Encounter
Admission: RE | Disposition: A | Discharge status: Home / Self Care | Payer: Self-pay | Source: Home / Self Care | Attending: Urology

## 2022-05-05 SURGERY — CYSTOSCOPY/URETEROSCOPY/HOLMIUM LASER/STENT PLACEMENT
Anesthesia: Choice | Laterality: Bilateral

## 2022-05-05 MED ORDER — LACTATED RINGERS IV SOLN: INTRAVENOUS | Status: DC

## 2022-05-05 MED ORDER — FAMOTIDINE 20 MG PO TABS: 20.0000 mg | ORAL_TABLET | Freq: Once | ORAL | Status: DC

## 2022-05-05 MED ORDER — CHLORHEXIDINE GLUCONATE 0.12 % MT SOLN
OROMUCOSAL | Status: AC
  Filled 2022-05-05: qty 15

## 2022-05-05 MED ORDER — CHLORHEXIDINE GLUCONATE 0.12 % MT SOLN: 15.0000 mL | Freq: Once | OROMUCOSAL | Status: DC

## 2022-05-05 MED ORDER — ORAL CARE MOUTH RINSE: 15.0000 mL | Freq: Once | OROMUCOSAL | Status: DC

## 2022-05-05 MED ORDER — FAMOTIDINE 20 MG PO TABS
ORAL_TABLET | ORAL | Status: AC
  Filled 2022-05-05: qty 1

## 2022-05-05 SURGICAL SUPPLY — 26 items
BAG DRAIN CYSTO-URO LG1000N (MISCELLANEOUS) ×2 IMPLANT
BASKET ZERO TIP 1.9FR (BASKET) IMPLANT
BRUSH SCRUB EZ 1% IODOPHOR (MISCELLANEOUS) ×2 IMPLANT
CATH URET FLEX-TIP 2 LUMEN 10F (CATHETERS) IMPLANT
CATH URETL OPEN END 6X70 (CATHETERS) IMPLANT
CNTNR SPEC 2.5X3XGRAD LEK (MISCELLANEOUS)
CONT SPEC 4OZ STER OR WHT (MISCELLANEOUS)
CONTAINER SPEC 2.5X3XGRAD LEK (MISCELLANEOUS) IMPLANT
DRAPE UTILITY 15X26 TOWEL STRL (DRAPES) ×2 IMPLANT
GLOVE SURG UNDER POLY LF SZ7.5 (GLOVE) ×2 IMPLANT
GOWN STRL REUS W/ TWL LRG LVL3 (GOWN DISPOSABLE) ×1 IMPLANT
GOWN STRL REUS W/ TWL XL LVL3 (GOWN DISPOSABLE) ×1 IMPLANT
GOWN STRL REUS W/TWL LRG LVL3 (GOWN DISPOSABLE) ×1
GOWN STRL REUS W/TWL XL LVL3 (GOWN DISPOSABLE) ×1
GUIDEWIRE STR DUAL SENSOR (WIRE) ×2 IMPLANT
IV NS IRRIG 3000ML ARTHROMATIC (IV SOLUTION) ×2 IMPLANT
KIT TURNOVER CYSTO (KITS) ×2 IMPLANT
PACK CYSTO AR (MISCELLANEOUS) ×2 IMPLANT
SET CYSTO W/LG BORE CLAMP LF (SET/KITS/TRAYS/PACK) ×2 IMPLANT
SHEATH NAVIGATOR HD 12/14X36 (SHEATH) IMPLANT
STENT URET 6FRX24 CONTOUR (STENTS) IMPLANT
STENT URET 6FRX26 CONTOUR (STENTS) IMPLANT
SURGILUBE 2OZ TUBE FLIPTOP (MISCELLANEOUS) ×2 IMPLANT
TRACTIP FLEXIVA PULSE ID 200 (Laser) ×2 IMPLANT
VALVE UROSEAL ADJ ENDO (VALVE) IMPLANT
WATER STERILE IRR 500ML POUR (IV SOLUTION) ×2 IMPLANT

## 2022-05-07 ENCOUNTER — Telehealth: Payer: Self-pay | Admitting: *Deleted

## 2022-05-07 NOTE — Telephone Encounter (Signed)
Pt calling asking when can she reschedule her surgery? Pt states she is suffering. Surgery was canceled due to (Her procedure has been canceled at this time d/t consumption of creamer in her coffee.)

## 2022-05-11 ENCOUNTER — Telehealth: Payer: Self-pay

## 2022-05-11 ENCOUNTER — Other Ambulatory Visit: Payer: Self-pay

## 2022-05-11 ENCOUNTER — Ambulatory Visit: Payer: 59 | Admitting: Family Medicine

## 2022-05-11 DIAGNOSIS — N200 Calculus of kidney: Secondary | ICD-10-CM

## 2022-05-11 DIAGNOSIS — N201 Calculus of ureter: Secondary | ICD-10-CM

## 2022-05-11 NOTE — Progress Notes (Signed)
[facility] Posting Form   Surgery Date/Time: Date: 05/19/2022  Surgeon: Dr. Irineo Axon, MD  Surgery Location: Day Surgery  Inpt ( No  )   Outpt (Yes)   Obs ( No  )   Diagnosis: N20.1, N20.0 Right Ureteral Stone, Left Nephrolithiasis  -CPT: 01007  Surgery: Bilateral Ureteroscopy with laser lithotripsy and stent exchange  Stop Anticoagulations: No  Cardiac/Medical/Pulmonary Clearance needed: no  *Orders entered into EPIC  Date: 05/11/22   *Case booked in EPIC  Date: 05/11/22  *Notified pt of Surgery: Date: 05/11/22  PRE-OP UA & CX: yes, will obtain in clinic on 05/14/2022  *Placed into Prior Authorization Work Angela Nevin Date: 05/11/22   Assistant/laser/rep:No

## 2022-05-11 NOTE — Telephone Encounter (Signed)
Spoke with pt. Reschedule to 05/19/2022, updated and sent.

## 2022-05-11 NOTE — Progress Notes (Deleted)
   There were no vitals taken for this visit.   Subjective:    Patient ID: Kim Graham, female    DOB: October 28, 1951, 71 y.o.   MRN: [id number]  HPI: Kim Graham is a 71 y.o. female  No chief complaint on file.  DEPRESSION Mood status: {Blank single:19197::"controlled","uncontrolled","better","worse","exacerbated","stable"} Satisfied with current treatment?: {Blank single:19197::"yes","no"} Symptom severity: {Blank single:19197::"mild","moderate","severe"}  Duration of current treatment : {Blank single:19197::"chronic","months","years"} Side effects: {Blank single:19197::"yes","no"} Medication compliance: {Blank single:19197::"excellent compliance","good compliance","fair compliance","poor compliance"} Psychotherapy/counseling: {Blank single:19197::"yes","no"} {Blank single:19197::"current","in the past"} Previous psychiatric medications: {Blank multiple:19196::"abilify","amitryptiline","buspar","celexa","cymbalta","depakote","effexor","lamictal","lexapro","lithium","nortryptiline","paxil","prozac","pristiq (desvenlafaxine","seroquel","wellbutrin","zoloft","zyprexa"} Depressed mood: {Blank single:19197::"yes","no"} Anxious mood: {Blank single:19197::"yes","no"} Anhedonia: {Blank single:19197::"yes","no"} Significant weight loss or gain: {Blank single:19197::"yes","no"} Insomnia: {Blank single:19197::"yes","no"} {Blank single:19197::"hard to fall asleep","hard to stay asleep"} Fatigue: {Blank single:19197::"yes","no"} Feelings of worthlessness or guilt: {Blank single:19197::"yes","no"} Impaired concentration/indecisiveness: {Blank single:19197::"yes","no"} Suicidal ideations: {Blank single:19197::"yes","no"} Hopelessness: {Blank single:19197::"yes","no"} Crying spells: {Blank single:19197::"yes","no"}    04/09/2022    2:57 PM 04/07/2022   10:58 AM 03/05/2022   11:21 AM 01/08/2022    1:55 PM 12/29/2021    1:51 PM  Depression screen PHQ 2/9  Decreased Interest 1 0 0 0 0  Down,  Depressed, Hopeless 1 0 0 0 0  PHQ - 2 Score 2 0 0 0 0  Altered sleeping 0   0 0  Tired, decreased energy 2   1 1   Change in appetite 1   0 1  Feeling bad or failure about yourself  0   0 0  Trouble concentrating 1   0 0  Moving slowly or fidgety/restless 0   0 0  Suicidal thoughts 0   0 0  PHQ-9 Score 6   1 2   Difficult doing work/chores Not difficult at all       HYPERTENSION Hypertension status: {Blank single:19197::"controlled","uncontrolled","better","worse","exacerbated","stable"}  Satisfied with current treatment? {Blank single:19197::"yes","no"} Duration of hypertension: {Blank single:19197::"chronic","months","years"} BP monitoring frequency:  {Blank single:19197::"not checking","rarely","daily","weekly","monthly","a few times a day","a few times a week","a few times a month"} BP range:  BP medication side effects:  {Blank single:19197::"yes","no"} Medication compliance: {Blank single:19197::"excellent compliance","good compliance","fair compliance","poor compliance"} Previous BP meds:{Blank multiple:19196::"none","amlodipine","amlodipine/benazepril","atenolol","benazepril","benazepril/HCTZ","bisoprolol (bystolic)","carvedilol","chlorthalidone","clonidine","diltiazem","exforge HCT","HCTZ","irbesartan (avapro)","labetalol","lisinopril","lisinopril-HCTZ","losartan (cozaar)","methyldopa","nifedipine","olmesartan (benicar)","olmesartan-HCTZ","quinapril","ramipril","spironalactone","tekturna","valsartan","valsartan-HCTZ","verapamil"} Aspirin: {Blank single:19197::"yes","no"} Recurrent headaches: {Blank single:19197::"yes","no"} Visual changes: {Blank single:19197::"yes","no"} Palpitations: {Blank single:19197::"yes","no"} Dyspnea: {Blank single:19197::"yes","no"} Chest pain: {Blank single:19197::"yes","no"} Lower extremity edema: {Blank single:19197::"yes","no"} Dizzy/lightheaded: {Blank single:19197::"yes","no"}  Relevant past medical, surgical, family and social history reviewed  and updated as indicated. Interim medical history since our last visit reviewed. Allergies and medications reviewed and updated.  Review of Systems  Per HPI unless specifically indicated above     Objective:    There were no vitals taken for this visit.  Wt Readings from Last 3 Encounters:  04/29/22 110 lb (49.9 kg)  04/17/22 110 lb (49.9 kg)  04/09/22 109 lb 9.6 oz (49.7 kg)    Physical Exam  Results for orders placed or performed during the hospital encounter of 05/04/22  Potassium  Result Value Ref Range   Potassium 3.9 3.5 - 5.1 mmol/L      Assessment & Plan:   Problem List Items Addressed This Visit   None    Follow up plan: No follow-ups on file.

## 2022-05-11 NOTE — Telephone Encounter (Signed)
I spoke with Kim Graham. We have discussed possible surgery dates and Tuesday July 11th, 2023 was agreed upon by all parties. Patient given information about surgery date, what to expect pre-operatively and post operatively.  We discussed that a Pre-Admission Testing office will be calling to set up the pre-op visit that will take place prior to surgery, and that these appointments are typically done over the phone with a Pre-Admissions RN.  Informed patient that our office will communicate any additional care to be provided after surgery. Patients questions or concerns were discussed during our call. Advised to call our office should there be any additional information, questions or concerns that arise. Patient verbalized understanding.

## 2022-05-14 ENCOUNTER — Telehealth: Payer: Self-pay | Admitting: *Deleted

## 2022-05-14 ENCOUNTER — Other Ambulatory Visit: Payer: 59

## 2022-05-14 DIAGNOSIS — N201 Calculus of ureter: Secondary | ICD-10-CM

## 2022-05-14 DIAGNOSIS — N200 Calculus of kidney: Secondary | ICD-10-CM

## 2022-05-14 LAB — URINALYSIS, COMPLETE
Bilirubin, UA: NEGATIVE
Glucose, UA: NEGATIVE
Ketones, UA: NEGATIVE
Nitrite, UA: NEGATIVE
Specific Gravity, UA: 1.02 (ref 1.005–1.030)
Urobilinogen, Ur: 0.2 mg/dL (ref 0.2–1.0)
pH, UA: 6 (ref 5.0–7.5)

## 2022-05-14 LAB — MICROSCOPIC EXAMINATION
RBC, Urine: >30 /hpf — AB (ref 0–2)
WBC, UA: >30 /hpf — AB (ref 0–5)

## 2022-05-14 NOTE — Telephone Encounter (Signed)
Patient called Triage line concerned with UA. Left a UA pre-op this morning. Wants to know results and if she should be taking anything prior to surgery.

## 2022-05-15 ENCOUNTER — Encounter
Admission: RE | Admit: 2022-05-15 | Discharge: 2022-05-15 | Disposition: A | Discharge status: Home / Self Care | Payer: 59 | Source: Ambulatory Visit | Attending: Urology | Admitting: Urology

## 2022-05-15 DIAGNOSIS — E119 Type 2 diabetes mellitus without complications: Secondary | ICD-10-CM

## 2022-05-15 MED ORDER — SULFAMETHOXAZOLE-TRIMETHOPRIM 800-160 MG PO TABS: 1.0000 | ORAL_TABLET | Freq: Two times a day (BID) | ORAL | 0 refills | Status: DC

## 2022-05-15 NOTE — Pre-Procedure Instructions (Signed)
Patient called to review any changes in medical or surgical history or medications which she denies at this time. Her surgery was originally scheduled 05/05/22 but was canceled due to patient drinking coffee with cream. Her pre op phone call was done on 04/29/22 and she was given printed instructions when she came for labs on 04/30/22. She is complaining today of urinary frequency and feeling "miserable". She denies any fever at this time but is taking ibuprofen. She was instructed to stop the ibuprofen and use Tylenol instead in preparation for upcoming urinary procedure. Patient asking result of her urine sample she brought to [facility] on 05/13/22. Patient was encouraged to contact urology office ASAP regarding her results. She was instructed to call back to our office once she has spoken to urology. Review of pre op instructions was provided and patient verbalized understanding.

## 2022-05-15 NOTE — Telephone Encounter (Signed)
Pt aware, medication sent to pharmacy. 

## 2022-05-18 ENCOUNTER — Encounter: Payer: 59 | Admitting: Urology

## 2022-05-18 LAB — CULTURE, URINE COMPREHENSIVE

## 2022-05-18 MED ORDER — GENTAMICIN SULFATE 40 MG/ML IJ SOLN: 5.0000 mg/kg | Freq: Once | INTRAVENOUS | Status: DC

## 2022-05-18 MED ORDER — SODIUM CHLORIDE 0.9 % IV SOLN: 2.0000 g | Freq: Once | INTRAVENOUS | Status: DC

## 2022-05-18 NOTE — Addendum Note (Signed)
Addended by: Letta Kocher A on: 05/18/2022 08:15 AM   Modules accepted: Orders

## 2022-05-19 ENCOUNTER — Encounter: Payer: Self-pay | Admitting: Urology

## 2022-05-19 ENCOUNTER — Ambulatory Visit: Payer: 59 | Admitting: Urgent Care

## 2022-05-19 ENCOUNTER — Encounter
Admission: RE | Disposition: A | Discharge status: Home / Self Care | Payer: Self-pay | Source: Home / Self Care | Attending: Urology

## 2022-05-19 ENCOUNTER — Ambulatory Visit: Payer: 59

## 2022-05-19 ENCOUNTER — Inpatient Hospital Stay
Admission: RE | Admit: 2022-05-19 | Discharge: 2022-05-21 | DRG: 853 | Disposition: A | Discharge status: Home / Self Care | Payer: 59 | Attending: Urology | Admitting: Urology

## 2022-05-19 ENCOUNTER — Other Ambulatory Visit: Payer: Self-pay

## 2022-05-19 DIAGNOSIS — A419 Sepsis, unspecified organism: Secondary | ICD-10-CM | POA: Diagnosis not present

## 2022-05-19 DIAGNOSIS — N390 Urinary tract infection, site not specified: Secondary | ICD-10-CM | POA: Diagnosis not present

## 2022-05-19 DIAGNOSIS — E872 Acidosis, unspecified: Secondary | ICD-10-CM

## 2022-05-19 DIAGNOSIS — Z8249 Family history of ischemic heart disease and other diseases of the circulatory system: Secondary | ICD-10-CM

## 2022-05-19 DIAGNOSIS — Z681 Body mass index (BMI) 19 or less, adult: Secondary | ICD-10-CM

## 2022-05-19 DIAGNOSIS — Z87442 Personal history of urinary calculi: Secondary | ICD-10-CM

## 2022-05-19 DIAGNOSIS — Z9049 Acquired absence of other specified parts of digestive tract: Secondary | ICD-10-CM

## 2022-05-19 DIAGNOSIS — I959 Hypotension, unspecified: Secondary | ICD-10-CM | POA: Diagnosis not present

## 2022-05-19 DIAGNOSIS — Z833 Family history of diabetes mellitus: Secondary | ICD-10-CM

## 2022-05-19 DIAGNOSIS — N202 Calculus of kidney with calculus of ureter: Secondary | ICD-10-CM | POA: Diagnosis not present

## 2022-05-19 DIAGNOSIS — I129 Hypertensive chronic kidney disease with stage 1 through stage 4 chronic kidney disease, or unspecified chronic kidney disease: Secondary | ICD-10-CM | POA: Diagnosis present

## 2022-05-19 DIAGNOSIS — F17200 Nicotine dependence, unspecified, uncomplicated: Secondary | ICD-10-CM | POA: Diagnosis present

## 2022-05-19 DIAGNOSIS — E43 Unspecified severe protein-calorie malnutrition: Secondary | ICD-10-CM

## 2022-05-19 DIAGNOSIS — E1122 Type 2 diabetes mellitus with diabetic chronic kidney disease: Secondary | ICD-10-CM | POA: Diagnosis present

## 2022-05-19 DIAGNOSIS — D696 Thrombocytopenia, unspecified: Secondary | ICD-10-CM

## 2022-05-19 DIAGNOSIS — N200 Calculus of kidney: Secondary | ICD-10-CM

## 2022-05-19 DIAGNOSIS — N1831 Chronic kidney disease, stage 3a: Secondary | ICD-10-CM

## 2022-05-19 DIAGNOSIS — E440 Moderate protein-calorie malnutrition: Secondary | ICD-10-CM | POA: Insufficient documentation

## 2022-05-19 DIAGNOSIS — J449 Chronic obstructive pulmonary disease, unspecified: Secondary | ICD-10-CM | POA: Diagnosis present

## 2022-05-19 DIAGNOSIS — N10 Acute tubulo-interstitial nephritis: Secondary | ICD-10-CM

## 2022-05-19 DIAGNOSIS — F17210 Nicotine dependence, cigarettes, uncomplicated: Secondary | ICD-10-CM | POA: Diagnosis present

## 2022-05-19 DIAGNOSIS — Z79899 Other long term (current) drug therapy: Secondary | ICD-10-CM

## 2022-05-19 DIAGNOSIS — N201 Calculus of ureter: Secondary | ICD-10-CM | POA: Diagnosis not present

## 2022-05-19 DIAGNOSIS — E119 Type 2 diabetes mellitus without complications: Secondary | ICD-10-CM

## 2022-05-19 HISTORY — PX: CYSTOSCOPY/URETEROSCOPY/HOLMIUM LASER/STENT PLACEMENT: SHX6546

## 2022-05-19 LAB — GLUCOSE, CAPILLARY
Glucose-Capillary: 108 mg/dL — ABNORMAL HIGH (ref 70–99)
Glucose-Capillary: 150 mg/dL — ABNORMAL HIGH (ref 70–99)
Glucose-Capillary: 160 mg/dL — ABNORMAL HIGH (ref 70–99)
Glucose-Capillary: 82 mg/dL (ref 70–99)

## 2022-05-19 LAB — CBC WITH DIFFERENTIAL/PLATELET
Abs Immature Granulocytes: 0.07 10*3/uL (ref 0.00–0.07)
Basophils Absolute: 0.1 10*3/uL (ref 0.0–0.1)
Basophils Relative: 0 %
Eosinophils Absolute: 0.1 10*3/uL (ref 0.0–0.5)
Eosinophils Relative: 1 %
HCT: 39.4 % (ref 36.0–46.0)
Hemoglobin: 12.2 g/dL (ref 12.0–15.0)
Immature Granulocytes: 1 %
Lymphocytes Relative: 3 %
Lymphs Abs: 0.4 10*3/uL — ABNORMAL LOW (ref 0.7–4.0)
MCH: 26.6 pg (ref 26.0–34.0)
MCHC: 31 g/dL (ref 30.0–36.0)
MCV: 85.8 fL (ref 80.0–100.0)
Monocytes Absolute: 0.9 10*3/uL (ref 0.1–1.0)
Monocytes Relative: 7 %
Neutro Abs: 12.4 10*3/uL — ABNORMAL HIGH (ref 1.7–7.7)
Neutrophils Relative %: 88 %
Platelets: 209 10*3/uL (ref 150–400)
RBC: 4.59 MIL/uL (ref 3.87–5.11)
RDW: 16.5 % — ABNORMAL HIGH (ref 11.5–15.5)
WBC: 13.9 10*3/uL — ABNORMAL HIGH (ref 4.0–10.5)
nRBC: 0 % (ref 0.0–0.2)

## 2022-05-19 LAB — BASIC METABOLIC PANEL
Anion gap: 6 (ref 5–15)
BUN: 15 mg/dL (ref 8–23)
CO2: 24 mmol/L (ref 22–32)
Calcium: 8.6 mg/dL — ABNORMAL LOW (ref 8.9–10.3)
Chloride: 112 mmol/L — ABNORMAL HIGH (ref 98–111)
Creatinine, Ser: 1.23 mg/dL — ABNORMAL HIGH (ref 0.44–1.00)
GFR, Estimated: 47 mL/min — ABNORMAL LOW (ref >60)
Glucose, Bld: 145 mg/dL — ABNORMAL HIGH (ref 70–99)
Potassium: 4.4 mmol/L (ref 3.5–5.1)
Sodium: 142 mmol/L (ref 135–145)

## 2022-05-19 LAB — LACTIC ACID, PLASMA: Lactic Acid, Venous: 1.3 mmol/L (ref 0.5–1.9)

## 2022-05-19 SURGERY — CYSTOSCOPY/URETEROSCOPY/HOLMIUM LASER/STENT PLACEMENT
Anesthesia: General | Site: Ureter | Laterality: Bilateral

## 2022-05-19 MED ORDER — ACETAMINOPHEN 325 MG PO TABS
650.0000 mg | ORAL_TABLET | Freq: Once | ORAL | Status: AC
  Administered 2022-05-19: 650 mg via ORAL

## 2022-05-19 MED ORDER — SODIUM CHLORIDE 0.9 % IV SOLN: Freq: Once | INTRAVENOUS | Status: AC

## 2022-05-19 MED ORDER — LIDOCAINE HCL (CARDIAC) PF 100 MG/5ML IV SOSY
PREFILLED_SYRINGE | INTRAVENOUS | Status: DC | PRN
  Administered 2022-05-19: 50 mg via INTRAVENOUS

## 2022-05-19 MED ORDER — GENTAMICIN SULFATE 40 MG/ML IJ SOLN
5.0000 mg/kg | Freq: Once | INTRAMUSCULAR | Status: AC
  Administered 2022-05-19: 250 mg via INTRAVENOUS
  Filled 2022-05-19: qty 6.25

## 2022-05-19 MED ORDER — ONDANSETRON HCL 4 MG/2ML IJ SOLN: 4.0000 mg | Freq: Once | INTRAMUSCULAR | Status: DC | PRN

## 2022-05-19 MED ORDER — ONDANSETRON HCL 4 MG/2ML IJ SOLN
INTRAMUSCULAR | Status: AC
  Filled 2022-05-19: qty 2

## 2022-05-19 MED ORDER — ONDANSETRON HCL 4 MG/2ML IJ SOLN
INTRAMUSCULAR | Status: DC | PRN
  Administered 2022-05-19: 4 mg via INTRAVENOUS

## 2022-05-19 MED ORDER — ACETAMINOPHEN 10 MG/ML IV SOLN
745.0000 mg | Freq: Once | INTRAVENOUS | Status: DC | PRN
Start: 2022-05-19 — End: 2022-05-19

## 2022-05-19 MED ORDER — ONDANSETRON HCL 4 MG/2ML IJ SOLN: 4.0000 mg | INTRAMUSCULAR | Status: DC | PRN

## 2022-05-19 MED ORDER — FAMOTIDINE 20 MG PO TABS
20.0000 mg | ORAL_TABLET | Freq: Once | ORAL | Status: AC
  Administered 2022-05-19: 20 mg via ORAL

## 2022-05-19 MED ORDER — ACETAMINOPHEN 325 MG PO TABS
ORAL_TABLET | ORAL | Status: AC
  Filled 2022-05-19: qty 2

## 2022-05-19 MED ORDER — CHLORHEXIDINE GLUCONATE 0.12 % MT SOLN
15.0000 mL | Freq: Once | OROMUCOSAL | Status: AC
  Administered 2022-05-19: 15 mL via OROMUCOSAL

## 2022-05-19 MED ORDER — FENTANYL CITRATE (PF) 100 MCG/2ML IJ SOLN
INTRAMUSCULAR | Status: AC
  Filled 2022-05-19: qty 2

## 2022-05-19 MED ORDER — PROPOFOL 10 MG/ML IV BOLUS
INTRAVENOUS | Status: AC
Start: 2022-05-19 — End: ?
  Filled 2022-05-19: qty 20

## 2022-05-19 MED ORDER — EPHEDRINE SULFATE (PRESSORS) 50 MG/ML IJ SOLN
INTRAMUSCULAR | Status: DC | PRN
  Administered 2022-05-19: 5 mg via INTRAVENOUS

## 2022-05-19 MED ORDER — SODIUM CHLORIDE 0.9 % IV BOLUS
500.0000 mL | Freq: Once | INTRAVENOUS | Status: AC
Start: 2022-05-19 — End: 2022-05-19
  Administered 2022-05-19: 500 mL via INTRAVENOUS

## 2022-05-19 MED ORDER — HYDROCODONE-ACETAMINOPHEN 5-325 MG PO TABS
1.0000 | ORAL_TABLET | Freq: Four times a day (QID) | ORAL | Status: DC | PRN
  Administered 2022-05-19 – 2022-05-21 (×3): 1 via ORAL
  Filled 2022-05-19 (×3): qty 1

## 2022-05-19 MED ORDER — OXYCODONE HCL 5 MG PO TABS
ORAL_TABLET | ORAL | Status: AC
  Filled 2022-05-19: qty 1

## 2022-05-19 MED ORDER — PHENYLEPHRINE HCL-NACL 20-0.9 MG/250ML-% IV SOLN
INTRAVENOUS | Status: DC | PRN
  Administered 2022-05-19: 50 ug/min via INTRAVENOUS

## 2022-05-19 MED ORDER — OXYCODONE HCL 5 MG PO TABS
5.0000 mg | ORAL_TABLET | Freq: Once | ORAL | Status: AC | PRN
  Administered 2022-05-19: 5 mg via ORAL

## 2022-05-19 MED ORDER — OXYBUTYNIN CHLORIDE 5 MG PO TABS
5.0000 mg | ORAL_TABLET | Freq: Three times a day (TID) | ORAL | Status: DC | PRN
  Filled 2022-05-19: qty 1

## 2022-05-19 MED ORDER — LACTATED RINGERS IV SOLN: INTRAVENOUS | Status: DC

## 2022-05-19 MED ORDER — LACTATED RINGERS IV SOLN: INTRAVENOUS | Status: DC | PRN

## 2022-05-19 MED ORDER — DEXAMETHASONE SODIUM PHOSPHATE 10 MG/ML IJ SOLN
INTRAMUSCULAR | Status: AC
  Filled 2022-05-19: qty 1

## 2022-05-19 MED ORDER — SODIUM CHLORIDE 0.9 % IV BOLUS
1000.0000 mL | Freq: Once | INTRAVENOUS | Status: AC
Start: 2022-05-19 — End: 2022-05-19
  Administered 2022-05-19: 1000 mL via INTRAVENOUS

## 2022-05-19 MED ORDER — FENTANYL CITRATE (PF) 100 MCG/2ML IJ SOLN
INTRAMUSCULAR | Status: DC | PRN
  Administered 2022-05-19 (×2): 25 ug via INTRAVENOUS

## 2022-05-19 MED ORDER — LIDOCAINE HCL (PF) 2 % IJ SOLN
INTRAMUSCULAR | Status: AC
  Filled 2022-05-19: qty 5

## 2022-05-19 MED ORDER — PHENYLEPHRINE HCL (PRESSORS) 10 MG/ML IV SOLN
INTRAVENOUS | Status: DC | PRN
  Administered 2022-05-19: 160 ug via INTRAVENOUS

## 2022-05-19 MED ORDER — OXYCODONE HCL 5 MG/5ML PO SOLN: 5.0000 mg | Freq: Once | ORAL | Status: AC | PRN

## 2022-05-19 MED ORDER — FENTANYL CITRATE (PF) 100 MCG/2ML IJ SOLN
25.0000 ug | INTRAMUSCULAR | Status: DC | PRN
  Administered 2022-05-19 (×4): 25 ug via INTRAVENOUS

## 2022-05-19 MED ORDER — SODIUM CHLORIDE 0.9 % IV SOLN: INTRAVENOUS | Status: DC

## 2022-05-19 MED ORDER — CHLORHEXIDINE GLUCONATE 0.12 % MT SOLN
OROMUCOSAL | Status: AC
  Filled 2022-05-19: qty 15

## 2022-05-19 MED ORDER — FLUCONAZOLE 100 MG PO TABS
100.0000 mg | ORAL_TABLET | Freq: Every day | ORAL | Status: DC
  Administered 2022-05-19 – 2022-05-21 (×3): 100 mg via ORAL
  Filled 2022-05-19 (×4): qty 1

## 2022-05-19 MED ORDER — FENTANYL CITRATE (PF) 100 MCG/2ML IJ SOLN
INTRAMUSCULAR | Status: AC
  Administered 2022-05-19: 50 ug via INTRAVENOUS
  Filled 2022-05-19: qty 2

## 2022-05-19 MED ORDER — PHENYLEPHRINE HCL-NACL 20-0.9 MG/250ML-% IV SOLN
INTRAVENOUS | Status: AC
  Filled 2022-05-19: qty 250

## 2022-05-19 MED ORDER — SODIUM CHLORIDE 0.9 % IR SOLN
Status: DC | PRN
  Administered 2022-05-19 (×2): 3000 mL via INTRAVESICAL

## 2022-05-19 MED ORDER — SODIUM CHLORIDE 0.9 % IV SOLN
2.0000 g | Freq: Once | INTRAVENOUS | Status: AC
  Administered 2022-05-19: 2 g via INTRAVENOUS
  Filled 2022-05-19: qty 2

## 2022-05-19 MED ORDER — DEXAMETHASONE SODIUM PHOSPHATE 10 MG/ML IJ SOLN
INTRAMUSCULAR | Status: DC | PRN
  Administered 2022-05-19: 5 mg via INTRAVENOUS

## 2022-05-19 MED ORDER — ORAL CARE MOUTH RINSE: 15.0000 mL | Freq: Once | OROMUCOSAL | Status: AC

## 2022-05-19 MED ORDER — SODIUM CHLORIDE 0.9 % IV SOLN
2.0000 g | Freq: Two times a day (BID) | INTRAVENOUS | Status: DC
  Administered 2022-05-19 – 2022-05-21 (×4): 2 g via INTRAVENOUS
  Filled 2022-05-19 (×2): qty 12.5
  Filled 2022-05-19: qty 2
  Filled 2022-05-19 (×2): qty 12.5
  Filled 2022-05-19: qty 2

## 2022-05-19 MED ORDER — PROPOFOL 10 MG/ML IV BOLUS
INTRAVENOUS | Status: DC | PRN
  Administered 2022-05-19: 70 mg via INTRAVENOUS
  Administered 2022-05-19: 40 mg via INTRAVENOUS

## 2022-05-19 MED ORDER — FAMOTIDINE 20 MG PO TABS
ORAL_TABLET | ORAL | Status: AC
  Filled 2022-05-19: qty 1

## 2022-05-19 MED ORDER — SODIUM CHLORIDE 0.9 % IV SOLN
1.0000 g | INTRAVENOUS | Status: DC
  Filled 2022-05-19: qty 10

## 2022-05-19 MED ORDER — IOHEXOL 180 MG/ML  SOLN
INTRAMUSCULAR | Status: DC | PRN
  Administered 2022-05-19 (×3): 10 mL

## 2022-05-19 SURGICAL SUPPLY — 27 items
BAG DRAIN CYSTO-URO LG1000N (MISCELLANEOUS) ×2 IMPLANT
BASKET ZERO TIP 1.9FR (BASKET) ×1 IMPLANT
BRUSH SCRUB EZ 1% IODOPHOR (MISCELLANEOUS) ×2 IMPLANT
CATH URET FLEX-TIP 2 LUMEN 10F (CATHETERS) ×1 IMPLANT
CATH URETL OPEN END 6X70 (CATHETERS) IMPLANT
CNTNR SPEC 2.5X3XGRAD LEK (MISCELLANEOUS) ×2
CONT SPEC 4OZ STER OR WHT (MISCELLANEOUS) ×2
CONTAINER SPEC 2.5X3XGRAD LEK (MISCELLANEOUS) IMPLANT
DRAPE UTILITY 15X26 TOWEL STRL (DRAPES) ×2 IMPLANT
FIBER LASER MOSES 200 DFL (Laser) ×1 IMPLANT
GLOVE SURG UNDER POLY LF SZ7.5 (GLOVE) ×2 IMPLANT
GOWN STRL REUS W/ TWL LRG LVL3 (GOWN DISPOSABLE) ×1 IMPLANT
GOWN STRL REUS W/ TWL XL LVL3 (GOWN DISPOSABLE) ×1 IMPLANT
GOWN STRL REUS W/TWL LRG LVL3 (GOWN DISPOSABLE) ×1
GOWN STRL REUS W/TWL XL LVL3 (GOWN DISPOSABLE) ×1
GUIDEWIRE STR DUAL SENSOR (WIRE) ×4 IMPLANT
IV NS IRRIG 3000ML ARTHROMATIC (IV SOLUTION) ×3 IMPLANT
KIT TURNOVER CYSTO (KITS) ×2 IMPLANT
PACK CYSTO AR (MISCELLANEOUS) ×2 IMPLANT
SET CYSTO W/LG BORE CLAMP LF (SET/KITS/TRAYS/PACK) ×2 IMPLANT
SHEATH NAVIGATOR HD 12/14X36 (SHEATH) ×1 IMPLANT
STENT URET 6FRX24 CONTOUR (STENTS) ×2 IMPLANT
STENT URET 6FRX26 CONTOUR (STENTS) IMPLANT
SURGILUBE 2OZ TUBE FLIPTOP (MISCELLANEOUS) ×2 IMPLANT
TRACTIP FLEXIVA PULSE ID 200 (Laser) ×2 IMPLANT
VALVE UROSEAL ADJ ENDO (VALVE) ×1 IMPLANT
WATER STERILE IRR 500ML POUR (IV SOLUTION) ×2 IMPLANT

## 2022-05-19 NOTE — Interval H&P Note (Signed)
History and Physical Interval Note:  05/19/2022 7:52 AM  Kim Graham  has presented today for surgery, with the diagnosis of Right Ureteral Stone, Left Nephrolithiasis.  The various methods of treatment have been discussed with the patient and family. After consideration of risks, benefits and other options for treatment, the patient has consented to  Procedure(s): CYSTOSCOPY/URETEROSCOPY/HOLMIUM LASER/STENT PLACEMENT (Bilateral) as a surgical intervention.  The patient's history has been reviewed, patient examined, no change in status, stable for surgery.  I have reviewed the patient's chart and labs.  Questions were answered to the patient's satisfaction.     Maribella Kuna C Milla Wahlberg

## 2022-05-19 NOTE — Op Note (Signed)
Preoperative diagnosis:  Right ureteral calculus Left nephrolithiasis  Postoperative diagnosis:  Same  Procedure:  Cystoscopy Bilateral ureteroscopy and stone removal Ureteroscopic laser lithotripsy Bilateral ureteral stent exchange (81F/24 cm)  Bilateral retrograde pyelography with interpretation Intraoperative fluoroscopy < 30 minutes  Surgeon: Lorin Picket C. Janeann Paisley, M.D.  Anesthesia: General  Complications: None  Intraoperative findings:  Cystoscopy-mild inflammatory changes trigone secondary to indwelling stents.  No solid or papillary lesions Right ureteroscopy- 8 mm mid ureteral calculus Left ureteropyeloscopy-12 mm renal pelvic calculus; 10 mm mid calyceal calculus; 7 mm mid calyceal calculus; 5 mm mid calyceal calculus.  2 lower calyceal calculi were unable to be treated secondary to limitations of ureteroscope deflection Bilateral retrograde pyelography post procedure showed no filling defects, stone fragments or contrast extravasation  EBL: Minimal  Specimens: Calculus fragments for analysis   Indication: Kim Graham is a 71 y.o. female previously status post right ureteral stent placement January 2023 and left ureteral stent placement April 2023 for obstructing calculi.  Her right ureteral stent was exchanged at the time of left ureteral stent placement.  Refer to H&P for details.  She presents for definitive stone treatment after reviewing the management options for treatment, the patient elected to proceed with the above surgical procedure(s). We have discussed the potential benefits and risks of the procedure, side effects of the proposed treatment, the likelihood of the patient achieving the goals of the procedure, and any potential problems that might occur during the procedure or recuperation. Informed consent has been obtained.  Description of procedure:  The patient was taken to the operating room and general anesthesia was induced.  The patient was placed in the  dorsal lithotomy position, prepped and draped in the usual sterile fashion, and preoperative antibiotics were administered. A preoperative time-out was performed.   A 21 French cystoscope was lubricated and passed per urethra.  Panendoscopy was performed with findings as described above.  The right ureteral stent was grasped with endoscopic forceps and brought out through the urethral meatus.  A guidewire would not advance through the stent.  The scope was repassed and a 0.038 Sensor wire was placed into the right UO alongside the stent and advanced to the renal pelvis under fluoroscopic guidance without difficulty.  The indwelling stent was then removed.  A 4.5 French semirigid ureteroscope was passed per urethra.  The scope was advanced into the right ureter alongside the guidewire and the calculus was identified in the right mid ureter as described above.  A 200 m Moses laser fiber was then placed through the ureteroscope and the calculus was dusted at a setting of 0.3J/40 Hz.  All fragments >1 mm in size were removed with a 1.9 French 0 tip nitinol basket.  After complete removal the ureteroscope was advanced to the renal pelvis and no additional calculi were seen.  Retrograde pyelogram was performed through the ureteroscope with findings as described above.  A 81F/24 cm Contour ureteral stent was then placed under fluoroscopic guidance.  A good curl was noted in an upper pole calyx under fluoroscopy and the distal end of the stent was well positioned in the bladder.  The cystoscope was repassed and the left ureteral stent was grasped with endoscopic forceps and brought out through the urethral meatus.  A 0.038 Sensor wire was then advanced to the stent into interpole calyx followed by stent removal.  A dual-lumen ureteral catheter was then advanced over the wire without difficulty and a second Sensor wire was placed.  A 12/14 [place]  ureteral access sheath was placed over the working wire under  fluoroscopic guidance without difficulty.  A 8 French dual channel digital flexible ureteroscope was placed through the access sheath and advanced into the renal pelvis without difficulty.  Pyeloscopy was performed with findings described above.  The above noted calculi were dusted with the holmium laser fiber at a setting of 0.3J/60 Hz.  Larger fragments that chipped off the main calculus settled in adjacent calyces and were treated with noncontact laser lithotripsy at 0.6J/50 Hz.    As noted above to visualize the lower calyceal calculi acute deflection of the ureteroscope was required and would not allow passage of the laser fiber or a basket to transfer to a different calyx and were not treated  Approximately 6 fragments estimated at 2-3 mm in size were removed with the 1.9 [place] nitinol basket. Retrograde pyelogram was performed through the ureteroscope and each calyx was sequentially examined under fluoroscopic guidance and no significant size fragments were identified.  The ureteral access sheath and ureteroscope were removed in tandem and the ureter showed no evidence of injury or perforation.  A 55F/24 cm Contour ureteral stent was placed under fluoroscopic guidance.  The wire was then removed with an adequate stent curl noted in the renal pelvis as well as in the bladder.  The bladder was then emptied and the procedure ended.  The patient appeared to tolerate the procedure well and without complications.  After anesthetic reversal the patient was transported to the PACU in stable condition.   Plan: The patient did not have a ride home and will be admitted for overnight observation Schedule office stent removal in 7-10 days   Irineo Axon, MD

## 2022-05-19 NOTE — Progress Notes (Signed)
Patient friend Larita Fife notified via messaging service through the surgery department, of patient spiking a temp and needing progressive care on 2 A to be closely monitored. Attempted to call multiple times but goes straight to voicemail, and mailbox is full. Dr. Micheline Maze attempted to call patient friend also but was unsuccessful.

## 2022-05-19 NOTE — Progress Notes (Signed)
Kim Graham is the patient contact (friend) [phone]

## 2022-05-19 NOTE — Assessment & Plan Note (Signed)
As evidenced by fever with a Tmax of 104, tachycardia, pyuria CBC and lactic acid are pending Patient received 1.5 L IV fluid bolus and will be placed on maintenance IV fluids Place patient empirically on IV cefepime especially since she had recent ureteroscopy done. Follow-up results of blood and urine culture

## 2022-05-19 NOTE — Consult Note (Signed)
Pharmacy Antibiotic Note  Kim Graham is a 71 y.o. female admitted on 05/19/2022 with sepsis.  Pharmacy has been consulted for cefepime dosing.   Plan: Cefepime 2 g q12H   Height: 5\' 4"  (162.6 cm) Weight: 49.9 kg (110 lb) IBW/kg (Calculated) : 54.7  Temp (24hrs), Avg:101.5 F (38.6 C), Min:97.5 F (36.4 C), Max:104.1 F (40.1 C)  Recent Labs  Lab 05/19/22 1624  WBC 13.9*  CREATININE 1.23*  LATICACIDVEN 1.3    Estimated Creatinine Clearance: 33.5 mL/min (A) (by C-G formula based on SCr of 1.23 mg/dL (H)).    No Known Allergies  Antimicrobials this admission: 7/11 ampicillin  and gentamicin x 1   7/11 cefepime >>   Dose adjustments this admission: None  Microbiology results: 7/11 BCx: pending 7/11 UCx: pending    Thank you for allowing pharmacy to be a part of this patient's care.  9/11, PharmD, BCPS 05/19/2022 5:17 PM

## 2022-05-19 NOTE — Sepsis Progress Note (Addendum)
eLink is following this Code Sepsis. Patient is currently in PACU

## 2022-05-19 NOTE — Anesthesia Preprocedure Evaluation (Signed)
Anesthesia Evaluation  Patient identified by MRN, date of birth, ID band Patient awake    Reviewed: Allergy & Precautions, NPO status , Patient's Chart, lab work & pertinent test results  History of Anesthesia Complications Negative for: history of anesthetic complications  Airway Mallampati: II   Neck ROM: Full    Dental  (+) Poor Dentition   Pulmonary COPD, former smoker (quit 2020),    Pulmonary exam normal breath sounds clear to auscultation       Cardiovascular hypertension, Normal cardiovascular exam Rhythm:Regular Rate:Normal  ECG 02/07/22:  Sinus rhythm Biatrial enlargement RSR' in V1 or V2, right VCD or RVH ST depr, consider ischemia, inferior leads   Neuro/Psych negative neurological ROS     GI/Hepatic negative GI ROS, (+) Hepatitis -, C  Endo/Other  diabetes, Type 2  Renal/GU Renal disease (nephrolithiasis)     Musculoskeletal   Abdominal   Peds  Hematology   Anesthesia Other Findings   Reproductive/Obstetrics                             Anesthesia Physical Anesthesia Plan  ASA: 2  Anesthesia Plan: General   Post-op Pain Management:    Induction: Intravenous  PONV Risk Score and Plan: 3 and Ondansetron, Dexamethasone and Treatment may vary due to age or medical condition  Airway Management Planned: LMA  Additional Equipment:   Intra-op Plan:   Post-operative Plan: Extubation in OR  Informed Consent: I have reviewed the patients History and Physical, chart, labs and discussed the procedure including the risks, benefits and alternatives for the proposed anesthesia with the patient or authorized representative who has indicated his/her understanding and acceptance.     Dental advisory given  Plan Discussed with: CRNA  Anesthesia Plan Comments: (Patient consented for risks of anesthesia including but not limited to:  - adverse reactions to medications - damage  to eyes, teeth, lips or other oral mucosa - nerve damage due to positioning  - sore throat or hoarseness - damage to heart, brain, nerves, lungs, other parts of body or loss of life  Informed patient about role of CRNA in peri- and intra-operative care.  Patient voiced understanding.)        Anesthesia Quick Evaluation

## 2022-05-19 NOTE — Anesthesia Procedure Notes (Signed)
Procedure Name: LMA Insertion Date/Time: 05/19/2022 8:17 AM  Performed by: Karoline Caldwell, CRNAPre-anesthesia Checklist: Patient identified, Patient being monitored, Timeout performed, Emergency Drugs available and Suction available Patient Re-evaluated:Patient Re-evaluated prior to induction Oxygen Delivery Method: Circle system utilized Preoxygenation: Pre-oxygenation with 100% oxygen Induction Type: IV induction Ventilation: Mask ventilation without difficulty LMA: LMA inserted LMA Size: 3.0 Tube type: Oral Number of attempts: 1 Placement Confirmation: positive ETCO2 and breath sounds checked- equal and bilateral Tube secured with: Tape Dental Injury: Teeth and Oropharynx as per pre-operative assessment  Comments: Placed by Lenis Noon SRNA

## 2022-05-19 NOTE — Assessment & Plan Note (Signed)
Patient is currently n.p.o. Check blood sugars every 4 hours

## 2022-05-19 NOTE — Consult Note (Signed)
Initial Consultation Note   Patient: Kim Graham ZMO:[id number] DOB: [birth year] PCP: Dorcas Carrow, DO DOA: 05/19/2022 DOS: the patient was seen and examined on 05/19/2022 Primary service: Riki Altes, MD  Referring physician: Dr Lonna Cobb  Reason for consult: Sepsis  Assessment/Plan: Assessment and Plan: * Sepsis secondary to UTI [facility]) As evidenced by fever with a Tmax of 104, tachycardia, pyuria CBC and lactic acid are pending Patient received 1.5 L IV fluid bolus and will be placed on maintenance IV fluids Place patient empirically on IV cefepime especially since she had recent ureteroscopy done. Follow-up results of blood and urine culture  Nephrolithiasis Status post ureteroscopy Further treatment plan per urology  Diabetes mellitus type 2, diet-controlled (HCC) Patient is currently n.p.o. Check blood sugars every 4 hours  Nicotine dependence Smoking cessation has been discussed with patient in detail We will place patient on a nicotine transdermal patch        [facility] will continue to follow the patient.  HPI: Kim Graham is a 71 y.o. female with past medical history significant for nicotine dependence, COPD, diabetes mellitus, history of hepatitis C status post treatment who is status post bilateral ureteroscopy.  While in PACU patient developed a fever with a Tmax of 104 as well as tachycardia.  Code sepsis was initiated. Patient received IV ampicillin and gentamicin prior to ureteroscopy which was done today. She complains of suprapubic pain and states that she has had frequency and dysuria. She denies having any chest pain, no shortness of breath, no nausea, no vomiting, no headache, no dizziness, no lightheadedness, no leg swelling, no palpitations, no diaphoresis, no blurred vision no focal deficit.   Review of Systems: As mentioned in the history of present illness. All other systems reviewed and are negative. Past Medical History:  Diagnosis Date    Acute cholecystitis 12/09/2021   Bile leak    COPD (chronic obstructive pulmonary disease) (HCC)    Diabetes mellitus type 2, diet-controlled (HCC) 04/15/2018   Hepatitis    History of kidney stones    Hypertension    Past Surgical History:  Procedure Laterality Date   CYSTOSCOPY W/ URETERAL STENT PLACEMENT Right 12/09/2021   Procedure: CYSTOSCOPY WITH RETROGRADE PYELOGRAM/URETERAL STENT PLACEMENT;  Surgeon: Riki Altes, MD;  Location: [facility] ORS;  Service: Urology;  Laterality: Right;   CYSTOSCOPY/URETEROSCOPY/HOLMIUM LASER/STENT PLACEMENT Bilateral 02/11/2022   Procedure: CYSTOSCOPY/URETEROSCOPY/HOLMIUM LASER/STENT PLACEMENT & RIGHT URETERAL STENT EXCHANGE;  Surgeon: Riki Altes, MD;  Location: [facility] ORS;  Service: Urology;  Laterality: Bilateral;   ENDOSCOPIC RETROGRADE CHOLANGIOPANCREATOGRAPHY (ERCP) WITH PROPOFOL N/A 12/11/2021   Procedure: ENDOSCOPIC RETROGRADE CHOLANGIOPANCREATOGRAPHY (ERCP) WITH PROPOFOL;  Surgeon: [name], MD;  Location: [facility] ENDOSCOPY;  Service: Endoscopy;  Laterality: N/A;   ERCP N/A 03/10/2022   Procedure: ENDOSCOPIC RETROGRADE CHOLANGIOPANCREATOGRAPHY (ERCP);  Surgeon: [name], MD;  Location: [facility] ENDOSCOPY;  Service: Endoscopy;  Laterality: N/A;  Stent removal   Social History:  reports that she quit smoking about 3 years ago. Her smoking use included cigarettes. She has a 24.50 pack-year smoking history. She has been exposed to tobacco smoke. She has never used smokeless tobacco. She reports that she does not currently use alcohol. She reports that she does not use drugs.  No Known Allergies  Family History  Problem Relation Age of Onset   Diabetes Mother    Cancer Father        Pancreatic   Diabetes Maternal Aunt    Diabetes Maternal Grandmother    Dementia Maternal Grandfather  Heart disease Maternal Grandfather     Prior to Admission medications   Medication Sig Start Date End Date Taking? Authorizing Provider  albuterol (VENTOLIN  HFA) 108 (90 Base) MCG/ACT inhaler Inhale 2 puffs into the lungs every 6 (six) hours as needed for wheezing or shortness of breath. 04/12/22  Yes Johnson, Megan P, DO  ARIPiprazole (ABILIFY) 30 MG tablet Take 1 tablet (30 mg total) by mouth daily. 04/12/22  Yes Johnson, Megan P, DO  ascorbic acid (VITAMIN C) 500 MG tablet Take by mouth.   Yes [provider]  methadone (DOLOPHINE) 10 MG tablet Take 10 mg by mouth daily.   Yes [provider]  Multiple Vitamin (MULTI-VITAMIN) tablet Take 1 tablet by mouth daily.   Yes [provider]  sulfamethoxazole-trimethoprim (BACTRIM DS) 800-160 MG tablet Take 1 tablet by mouth every 12 (twelve) hours for 7 days. 05/15/22 05/22/22 Yes McGowan, Carollee Herter A, PA-C  umeclidinium-vilanterol (ANORO ELLIPTA) 62.5-25 MCG/ACT AEPB Inhale 1 puff into the lungs daily at 6 (six) AM. 10/14/21  Yes Johnson, Megan P, DO  polyethylene glycol (MIRALAX / GLYCOLAX) 17 g packet Take 17 g by mouth daily. 02/14/22   Enedina Finner, MD  potassium chloride SA (KLOR-CON M) 20 MEQ tablet Take 2 tablets (40 mEq total) by mouth daily for 4 days. 04/07/22 04/11/22  Lynn Ito, MD  senna-docusate (SENOKOT-S) 8.6-50 MG tablet Take 1 tablet by mouth 2 (two) times daily. 02/13/22   Enedina Finner, MD    Physical Exam: Vitals:   05/19/22 1525 05/19/22 1530 05/19/22 1531 05/19/22 1630  BP:  (!) 153/82 (!) 153/82 119/84  Pulse: (!) 119 97 (!) 117 (!) 112  Resp: (!) 23 (!) 25 (!) 25 16  Temp:  (!) 104.1 F (40.1 C) (!) 103.1 F (39.5 C) (!) 103.2 F (39.6 C)  TempSrc:      SpO2: 91% 95% (!) 86% 95%  Weight:      Height:       Physical Exam Vitals and nursing note reviewed.  Constitutional:      Comments: Lethargic  HENT:     Head: Normocephalic and atraumatic.     Nose: Nose normal.     Mouth/Throat:     Mouth: Mucous membranes are moist.  Eyes:     Pupils: Pupils are equal, round, and reactive to light.  Cardiovascular:     Rate and Rhythm: Tachycardia  present.  Pulmonary:     Effort: Pulmonary effort is normal.     Breath sounds: Normal breath sounds.  Abdominal:     General: Abdomen is flat.     Palpations: Abdomen is soft.     Tenderness: There is abdominal tenderness.     Comments: Suprapubic tenderness  Musculoskeletal:        General: Normal range of motion.     Cervical back: Normal range of motion and neck supple.  Skin:    General: Skin is warm and dry.  Neurological:     General: No focal deficit present.     Mental Status: She is alert.     Motor: Weakness present.  Psychiatric:        Mood and Affect: Mood normal.        Behavior: Behavior normal.     Data Reviewed:  Labs pending  Results are pending, will review when available.    Family Communication: Attempted to reach patient's friend, Adah Perl over the phone.  Unable to leave a voicemail Primary team communication:  Thank you  very much for involving [place] in the care of your patient.  Author: Lucile Shutters, MD 05/19/2022 4:55 PM  For on call review [email or web address].

## 2022-05-19 NOTE — Assessment & Plan Note (Signed)
Smoking cessation has been discussed with patient in detail We will place patient on a nicotine transdermal patch 

## 2022-05-19 NOTE — Progress Notes (Signed)
Upon preparing to take patient to her room, this nurse and charge RN noted patient to feel very hot and noted that she was tachycardic up to 130 BPM. Sinus tach. Temp 104 (F). Patient has been complaining of chronic back pain and was given oxy 5 mg as ordered. Notified Dr. Lonna Cobb of patient symptoms and clinical findings and patient is also tachypnic. 500 cc bolus started and tylenol 650 mg po tylenol given per order of Dr. Karlton Lemon. Patient seems a little delirious at times, but is answering questions appropriately/ correctly. Code sepsis was called and elink is following.

## 2022-05-19 NOTE — H&P (Signed)
Urology history and physical  Chief Complaint: Kidney stones  History of Present Illness: Kim Graham is a 71 y.o. with a previous history of acute cholecystitis and a right ureteral calculus who underwent cholecystectomy and right ureteral stent placement 12/09/2021.  Definitive stone treatment was delayed secondary to recovery from her cholecystectomy.  She presented a left ureteral stent was placed during hospitalization in early April 23 with a 16 mm obstructing left UPJ calculus and positive blood culture.  Her right ureteral stent was exchanged at that time.  It has been difficult to schedule definitive stone treatment and she presents today for bilateral ureteroscopy with laser lithotripsy/stone removal.  Preoperative urine culture grew mixed flora.  Past Medical History:  Diagnosis Date   Acute cholecystitis 12/09/2021   Bile leak    COPD (chronic obstructive pulmonary disease) (HCC)    Diabetes mellitus type 2, diet-controlled (HCC) 04/15/2018   Hepatitis    History of kidney stones    Hypertension     Past Surgical History:  Procedure Laterality Date   CYSTOSCOPY W/ URETERAL STENT PLACEMENT Right 12/09/2021   Procedure: CYSTOSCOPY WITH RETROGRADE PYELOGRAM/URETERAL STENT PLACEMENT;  Surgeon: Riki Altes, MD;  Location: [facility] ORS;  Service: Urology;  Laterality: Right;   CYSTOSCOPY/URETEROSCOPY/HOLMIUM LASER/STENT PLACEMENT Bilateral 02/11/2022   Procedure: CYSTOSCOPY/URETEROSCOPY/HOLMIUM LASER/STENT PLACEMENT & RIGHT URETERAL STENT EXCHANGE;  Surgeon: Riki Altes, MD;  Location: [facility] ORS;  Service: Urology;  Laterality: Bilateral;   ENDOSCOPIC RETROGRADE CHOLANGIOPANCREATOGRAPHY (ERCP) WITH PROPOFOL N/A 12/11/2021   Procedure: ENDOSCOPIC RETROGRADE CHOLANGIOPANCREATOGRAPHY (ERCP) WITH PROPOFOL;  Surgeon: [name], MD;  Location: [facility] ENDOSCOPY;  Service: Endoscopy;  Laterality: N/A;   ERCP N/A 03/10/2022   Procedure: ENDOSCOPIC RETROGRADE CHOLANGIOPANCREATOGRAPHY  (ERCP);  Surgeon: [name], MD;  Location: [facility] ENDOSCOPY;  Service: Endoscopy;  Laterality: N/A;  Stent removal    Home Medications:  Current Facility-Administered Medications for the 05/19/22 encounter [facility] Encounter)  Medication   ampicillin (OMNIPEN) 2 g in sodium chloride 0.9 % 50 mL IVPB   gentamicin (GARAMYCIN) 250 mg in dextrose 5 % 50 mL IVPB   Current Meds  Medication Sig   albuterol (VENTOLIN HFA) 108 (90 Base) MCG/ACT inhaler Inhale 2 puffs into the lungs every 6 (six) hours as needed for wheezing or shortness of breath.   ARIPiprazole (ABILIFY) 30 MG tablet Take 1 tablet (30 mg total) by mouth daily.   ascorbic acid (VITAMIN C) 500 MG tablet Take by mouth.   methadone (DOLOPHINE) 10 MG tablet Take 10 mg by mouth daily.   Multiple Vitamin (MULTI-VITAMIN) tablet Take 1 tablet by mouth daily.   sulfamethoxazole-trimethoprim (BACTRIM DS) 800-160 MG tablet Take 1 tablet by mouth every 12 (twelve) hours for 7 days.   umeclidinium-vilanterol (ANORO ELLIPTA) 62.5-25 MCG/ACT AEPB Inhale 1 puff into the lungs daily at 6 (six) AM.    Allergies: No Known Allergies  Family History  Problem Relation Age of Onset   Diabetes Mother    Cancer Father        Pancreatic   Diabetes Maternal Aunt    Diabetes Maternal Grandmother    Dementia Maternal Grandfather    Heart disease Maternal Grandfather     Social History:  reports that she quit smoking about 3 years ago. Her smoking use included cigarettes. She has a 24.50 pack-year smoking history. She has been exposed to tobacco smoke. She has never used smokeless tobacco. She reports that she does not currently use alcohol. She reports that she does not use drugs.  ROS: No fever, chills, chest pain or shortness of breath  Physical Exam:  Constitutional:  Alert and oriented, No acute distress HEENT: [place] AT, moist mucus membranes.  Trachea midline, no masses Cardiovascular: Regular rate and rhythm, no clubbing, cyanosis, or  edema. Respiratory: Normal respiratory effort, lungs clear bilaterally GI: Abdomen is soft, nontender, nondistended, no abdominal masses GU: No CVA tenderness Skin: No rashes, bruises or suspicious lesions Lymph: No cervical or inguinal adenopathy Neurologic: Grossly intact, no focal deficits, moving all 4 extremities Psychiatric: Normal mood and affect   Impression/Assessment:  Right ureteral calculus and left UPJ calculus  Plan:  Bilateral ureteroscopy with laser lithotripsy/stone removal The procedure was discussed in detail including potential risks of bleeding, ureteral injury, infection/sepsis.  All questions were answered and she desires to proceed   05/19/2022, 7:48 AM  Irineo Axon,  MD

## 2022-05-19 NOTE — Progress Notes (Signed)
Contacted by PACU nurse regarding tachycardia ~ 120 and temp spike 104.  Preop urine culture mixed flora.  Preoperative IV antibiotics ampicillin/gentamicin.  Code sepsis was initiated.  Hospitalist consult requested to assist in medical management.

## 2022-05-19 NOTE — Progress Notes (Addendum)
Dr. Micheline Maze in to see the patient in PACU bay 1. Dr. Micheline Maze also says that patient is to receive another 1,000 Liter bolus after 500 cc bolus completed. Patient is currently having labs drawn, and urine specimen obtained by in and out cath performed by this RN according to the orders of Dr. Lonna Cobb.

## 2022-05-19 NOTE — Progress Notes (Signed)
Pharmacy Antibiotic Note  Kim Graham is a 71 y.o. female admitted on 05/19/2022 for planned surgical procedure.  Pharmacy has been consulted for gentamicin dosing.  Plan: Gentamicin 5mg /kg x 1 dose pre-op  Height: 5\' 4"  (162.6 cm) Weight: 49.9 kg (110 lb) IBW/kg (Calculated) : 54.7  Temp (24hrs), Avg:97.7 F (36.5 C), Min:97.7 F (36.5 C), Max:97.7 F (36.5 C)    No Known Allergies   Thank you for allowing pharmacy to be a part of this patient's care.  , PharmD, BCPS Clinical Pharmacist 05/19/2022 11:09 AM

## 2022-05-19 NOTE — Progress Notes (Signed)
Notified patient friend Larita Fife that patient is doing well and is going to room 260 on the second floor.

## 2022-05-19 NOTE — Assessment & Plan Note (Signed)
Status post ureteroscopy Further treatment plan per urology

## 2022-05-19 NOTE — Transfer of Care (Signed)
Immediate Anesthesia Transfer of Care Note  Patient: Mischell Branford Krakow  Procedure(s) Performed: CYSTOSCOPY/URETEROSCOPY/HOLMIUM LASER/STENT PLACEMENT (Bilateral: Ureter)  Patient Location: PACU  Anesthesia Type:General  Level of Consciousness: awake, alert  and oriented  Airway & Oxygen Therapy: Patient Spontanous Breathing  Post-op Assessment: Report given to RN and Post -op Vital signs reviewed and stable  Post vital signs: Reviewed and stable  Last Vitals:  Vitals Value Taken Time  BP 150/71 05/19/22 1121  Temp 36.4 C 05/19/22 1121  Pulse 98 05/19/22 1128  Resp 11 05/19/22 1128  SpO2 100 % 05/19/22 1128  Vitals shown include unvalidated device data.  Last Pain:  Vitals:   05/19/22 1121  TempSrc:   PainSc: Asleep         Complications: No notable events documented.

## 2022-05-19 NOTE — Progress Notes (Signed)
Patient is resting comfortably at this time. Denies pain, nausea. Cefepime dose complete. Receiving IV fluids according to order. Offered to assist patient with ordering a dinner tray multiple times. Patient refuses and says that she "is not hungry".

## 2022-05-19 NOTE — Progress Notes (Signed)
Pacu procedure (popliteal block) documented on wrong patient. Please disregard.

## 2022-05-20 ENCOUNTER — Encounter: Payer: Self-pay | Admitting: Urology

## 2022-05-20 DIAGNOSIS — N200 Calculus of kidney: Secondary | ICD-10-CM

## 2022-05-20 DIAGNOSIS — E43 Unspecified severe protein-calorie malnutrition: Secondary | ICD-10-CM

## 2022-05-20 DIAGNOSIS — A419 Sepsis, unspecified organism: Secondary | ICD-10-CM | POA: Diagnosis present

## 2022-05-20 DIAGNOSIS — Z9049 Acquired absence of other specified parts of digestive tract: Secondary | ICD-10-CM | POA: Diagnosis not present

## 2022-05-20 DIAGNOSIS — J449 Chronic obstructive pulmonary disease, unspecified: Secondary | ICD-10-CM | POA: Diagnosis present

## 2022-05-20 DIAGNOSIS — N390 Urinary tract infection, site not specified: Secondary | ICD-10-CM | POA: Diagnosis not present

## 2022-05-20 DIAGNOSIS — Z681 Body mass index (BMI) 19 or less, adult: Secondary | ICD-10-CM | POA: Diagnosis not present

## 2022-05-20 DIAGNOSIS — I959 Hypotension, unspecified: Secondary | ICD-10-CM | POA: Diagnosis not present

## 2022-05-20 DIAGNOSIS — I129 Hypertensive chronic kidney disease with stage 1 through stage 4 chronic kidney disease, or unspecified chronic kidney disease: Secondary | ICD-10-CM | POA: Diagnosis present

## 2022-05-20 DIAGNOSIS — Z79899 Other long term (current) drug therapy: Secondary | ICD-10-CM | POA: Diagnosis not present

## 2022-05-20 DIAGNOSIS — N202 Calculus of kidney with calculus of ureter: Secondary | ICD-10-CM

## 2022-05-20 DIAGNOSIS — N1831 Chronic kidney disease, stage 3a: Secondary | ICD-10-CM | POA: Diagnosis present

## 2022-05-20 DIAGNOSIS — Z87442 Personal history of urinary calculi: Secondary | ICD-10-CM | POA: Diagnosis not present

## 2022-05-20 DIAGNOSIS — E440 Moderate protein-calorie malnutrition: Secondary | ICD-10-CM | POA: Insufficient documentation

## 2022-05-20 DIAGNOSIS — F17210 Nicotine dependence, cigarettes, uncomplicated: Secondary | ICD-10-CM | POA: Diagnosis present

## 2022-05-20 DIAGNOSIS — Z833 Family history of diabetes mellitus: Secondary | ICD-10-CM | POA: Diagnosis not present

## 2022-05-20 DIAGNOSIS — Z8249 Family history of ischemic heart disease and other diseases of the circulatory system: Secondary | ICD-10-CM | POA: Diagnosis not present

## 2022-05-20 DIAGNOSIS — N10 Acute tubulo-interstitial nephritis: Secondary | ICD-10-CM

## 2022-05-20 DIAGNOSIS — N201 Calculus of ureter: Secondary | ICD-10-CM | POA: Diagnosis not present

## 2022-05-20 DIAGNOSIS — E1122 Type 2 diabetes mellitus with diabetic chronic kidney disease: Secondary | ICD-10-CM | POA: Diagnosis present

## 2022-05-20 LAB — BASIC METABOLIC PANEL
Anion gap: 6 (ref 5–15)
BUN: 16 mg/dL (ref 8–23)
CO2: 21 mmol/L — ABNORMAL LOW (ref 22–32)
Calcium: 8.5 mg/dL — ABNORMAL LOW (ref 8.9–10.3)
Chloride: 114 mmol/L — ABNORMAL HIGH (ref 98–111)
Creatinine, Ser: 1.26 mg/dL — ABNORMAL HIGH (ref 0.44–1.00)
GFR, Estimated: 46 mL/min — ABNORMAL LOW (ref >60)
Glucose, Bld: 97 mg/dL (ref 70–99)
Potassium: 4.8 mmol/L (ref 3.5–5.1)
Sodium: 141 mmol/L (ref 135–145)

## 2022-05-20 LAB — GLUCOSE, CAPILLARY
Glucose-Capillary: 110 mg/dL — ABNORMAL HIGH (ref 70–99)
Glucose-Capillary: 91 mg/dL (ref 70–99)
Glucose-Capillary: 99 mg/dL (ref 70–99)
Glucose-Capillary: 99 mg/dL (ref 70–99)
Glucose-Capillary: 180 mg/dL — ABNORMAL HIGH (ref 70–99)

## 2022-05-20 LAB — CBC
HCT: 40.4 % (ref 36.0–46.0)
Hemoglobin: 12.2 g/dL (ref 12.0–15.0)
MCH: 26.5 pg (ref 26.0–34.0)
MCHC: 30.2 g/dL (ref 30.0–36.0)
MCV: 87.6 fL (ref 80.0–100.0)
Platelets: 156 10*3/uL (ref 150–400)
RBC: 4.61 MIL/uL (ref 3.87–5.11)
RDW: 17.1 % — ABNORMAL HIGH (ref 11.5–15.5)
WBC: 16.8 10*3/uL — ABNORMAL HIGH (ref 4.0–10.5)
nRBC: 0 % (ref 0.0–0.2)

## 2022-05-20 LAB — URINE CULTURE: Culture: NO GROWTH

## 2022-05-20 MED ORDER — ARIPIPRAZOLE 2 MG PO TABS
20.0000 mg | ORAL_TABLET | Freq: Every day | ORAL | Status: DC
  Administered 2022-05-20 – 2022-05-21 (×2): 20 mg via ORAL
  Filled 2022-05-20: qty 2
  Filled 2022-05-20: qty 10
  Filled 2022-05-20: qty 2

## 2022-05-20 MED ORDER — TAMSULOSIN HCL 0.4 MG PO CAPS
0.4000 mg | ORAL_CAPSULE | Freq: Every day | ORAL | Status: DC
  Administered 2022-05-20 – 2022-05-21 (×2): 0.4 mg via ORAL
  Filled 2022-05-20 (×2): qty 1

## 2022-05-20 MED ORDER — ADULT MULTIVITAMIN W/MINERALS CH
1.0000 | ORAL_TABLET | Freq: Every day | ORAL | Status: DC
  Administered 2022-05-21: 1 via ORAL
  Filled 2022-05-20: qty 1

## 2022-05-20 MED ORDER — ASCORBIC ACID 500 MG PO TABS
500.0000 mg | ORAL_TABLET | Freq: Two times a day (BID) | ORAL | Status: DC
  Administered 2022-05-20 – 2022-05-21 (×2): 500 mg via ORAL
  Filled 2022-05-20 (×2): qty 1

## 2022-05-20 MED ORDER — METHADONE HCL 10 MG/ML PO CONC
100.0000 mg | Freq: Every day | ORAL | Status: DC
  Administered 2022-05-20 – 2022-05-21 (×2): 100 mg via ORAL
  Filled 2022-05-20: qty 10

## 2022-05-20 MED ORDER — INSULIN ASPART 100 UNIT/ML IJ SOLN
0.0000 [IU] | Freq: Three times a day (TID) | INTRAMUSCULAR | Status: DC
  Administered 2022-05-21: 1 [IU] via SUBCUTANEOUS
  Filled 2022-05-20: qty 1

## 2022-05-20 MED ORDER — ENSURE ENLIVE PO LIQD
237.0000 mL | Freq: Three times a day (TID) | ORAL | Status: DC
  Administered 2022-05-20 – 2022-05-21 (×4): 237 mL via ORAL

## 2022-05-20 MED ORDER — POLYETHYLENE GLYCOL 3350 17 G PO PACK
17.0000 g | PACK | Freq: Every day | ORAL | Status: DC | PRN
  Filled 2022-05-20: qty 1

## 2022-05-20 MED ORDER — SENNOSIDES-DOCUSATE SODIUM 8.6-50 MG PO TABS
1.0000 | ORAL_TABLET | Freq: Two times a day (BID) | ORAL | Status: DC
  Administered 2022-05-20 – 2022-05-21 (×3): 1 via ORAL
  Filled 2022-05-20 (×3): qty 1

## 2022-05-20 MED ORDER — GLUCERNA SHAKE PO LIQD: 237.0000 mL | Freq: Two times a day (BID) | ORAL | Status: DC

## 2022-05-20 NOTE — Progress Notes (Signed)
Initial Nutrition Assessment  DOCUMENTATION CODES:   Non-severe (moderate) malnutrition in context of chronic illness  INTERVENTION:   Ensure Enlive po TID, each supplement provides 350 kcal and 20 grams of protein.  Magic cup TID with meals, each supplement provides 290 kcal and 9 grams of protein  MVI po daily   Vitamin C 523m po BID  Liberalize diet   Pt at high refeed risk; recommend monitor potassium, magnesium and phosphorus labs daily until stable  Daily weights   NUTRITION DIAGNOSIS:   Moderate Malnutrition related to catabolic illness (COPD) as evidenced by mild fat depletion, moderate muscle depletion, severe muscle depletion.  GOAL:   Patient will meet greater than or equal to 90% of their needs  MONITOR:   PO intake, Supplement acceptance, Labs, Weight trends, Skin, I & O's  REASON FOR ASSESSMENT:   Malnutrition Screening Tool    ASSESSMENT:   71y/o female with h/o DM, CKD III, HTN, depression, hep C, COPD, cholelithiasis s/p robotic assisted laparoscopic cholecystectomy 1/31, bacteremia and right ureteral stone/left nephrolithiasis who is admitted with sepsis, UTI and acute pyelonephritis now status post bilateral ureteroscopy with laser lithotripsy and bilateral stent exchange 7/11  Met with pt in room today. Pt eating lunch at time of RD visit; pt ate ~10% of the spaghetti on her lunch tray. Pt reports decreased appetite and oral intake for several months pta. Pt reports that since January, she has had numerous health problems and had to go to SNF where she did not like the food. Pt reports a significant weight loss since January. Per chart, pt has lost 24lbs(18%) in less than 1 year; this is significant weight loss. Pt reports that has not been drinking any supplements at home but she is willing to drink them in hospital (prefers chocolate). RD will add supplements and vitamins to help pt meet her estimated needs and to support wound healing. Pt is at high  refeed risk.   Medications reviewed and include: diflucan, insulin, methadone, senokot, NaCl _0 /hr, cefepime   Labs reviewed: K 4.8 wnl, creat 1.26(H) Wbc- 16.8(H) Cbgs- 99, 91, 110 x 24 hrs AIC 6.4(H)- 2/1  NUTRITION - FOCUSED PHYSICAL EXAM:  Flowsheet Row Most Recent Value  Orbital Region No depletion  Upper Arm Region Mild depletion  Thoracic and Lumbar Region Mild depletion  Buccal Region No depletion  Temple Region Mild depletion  Clavicle Bone Region Severe depletion  Clavicle and Acromion Bone Region Severe depletion  Scapular Bone Region Moderate depletion  Dorsal Hand Moderate depletion  Patellar Region Severe depletion  Anterior Thigh Region Severe depletion  Posterior Calf Region Severe depletion  Edema (RD Assessment) None  Hair Reviewed  Eyes Reviewed  Mouth Reviewed  Skin Reviewed  Nails Reviewed   Diet Order:   Diet Order             Diet regular Room service appropriate? Yes; Fluid consistency: Thin  Diet effective now                  EDUCATION NEEDS:   Education needs have been addressed  Skin:  Skin Assessment: Reviewed RN Assessment (incision vagina, sacral wound)  Last BM:  7/10  Height:   Ht Readings from Last 1 Encounters:  05/19/22 _1  (1.626 m)    Weight:   Wt Readings from Last 1 Encounters:  05/20/22 53.7 kg    Ideal Body Weight:  54.5 kg  BMI:  Body mass index is 20.32 kg/m.  Estimated Nutritional Needs:  Kcal:  1400-1600kcal/day  Protein:  70-80g/day  Fluid:  1.4-1.6L/day  Koleen Distance MS, RD, LDN Please refer to [facility] for RD and/or RD on-call/weekend/after hours pager

## 2022-05-20 NOTE — Progress Notes (Signed)
  Progress Note   Patient: Kim Graham [id number]:[id number] DOB: April 15, 1951 DOA: 05/19/2022     0 DOS: the patient was seen and examined on 05/20/2022   Brief hospital course:  Athea Haley Ripoll is a 71 y.o. female with past medical history significant for nicotine dependence, COPD, diabetes mellitus, history of hepatitis C status post treatment who is status post bilateral ureteroscopy and stent placement for kidney stone. Postoperatively, patient developed high fever of 104 and a tachycardia.  Patient is started on IV antibiotics for possible pyelonephritis.  Assessment and Plan: Sepsis secondary to acute pyelonephritis Acute pyelonephritis Nephrolithiasis status post ureteral stent. Patient developed high fever tachycardia and some leukocytosis consistent with sepsis.  Condition most likely due to acute pyelonephritis.  Pending culture results from urine and blood Continue cefepime. Patient is status post ureteral stent placement. Patient be continued followed by urology  Severe protein calorie malnutrition. Patient's appear to be severely malnourished with severe muscle atrophy.  Start protein supplements.  Chronic kidney disease stage IIIa. Renal function still stable.  Type 2 diabetes Continue sliding scale insulin.        Subjective:  Patient still had low-grade fever today of 100.8.  Poor appetite, no nausea vomiting.  No diarrhea  No shortness of breath.  Physical Exam: Vitals:   05/19/22 2335 05/20/22 0403 05/20/22 0837 05/20/22 1158  BP: (!) 121/44 (!) 126/51 (!) 104/52 (!) 117/50  Pulse: 91 93 92 86  Resp: 16 18 14 15   Temp: 98.5 F (36.9 C) (!) 100.8 F (38.2 C) 98.4 F (36.9 C) 100 F (37.8 C)  TempSrc: Oral Oral    SpO2: 100% 97% 98% 94%  Weight:      Height:       General exam: Appears calm and comfortable, severely malnourished. Respiratory system: Clear to auscultation. Respiratory effort normal. Cardiovascular system: S1 & S2 heard, RRR. No JVD, murmurs,  rubs, gallops or clicks. No pedal edema. Gastrointestinal system: Abdomen is nondistended, soft and nontender. No organomegaly or masses felt. Normal bowel sounds heard. Central nervous system: Alert and oriented x2. No focal neurological deficits. Extremities: Severe muscle atrophy. Skin: No rashes, lesions or ulcers Psychiatry: Judgement and insight appear normal. Mood & affect appropriate.   Data Reviewed:  Lab results reviewed.  Family Communication:   Disposition: Status is: Observation   Planned Discharge Destination:  Pending    Time spent: 55 minutes  Author: , MD 05/20/2022 12:54 PM  For on call review [email or web address]/[email or web address].

## 2022-05-20 NOTE — Anesthesia Postprocedure Evaluation (Signed)
Anesthesia Post Note  Patient: Kim Graham  Procedure(s) Performed: CYSTOSCOPY/URETEROSCOPY/HOLMIUM LASER/STENT PLACEMENT (Bilateral: Ureter)  Patient location during evaluation: PACU Anesthesia Type: General Level of consciousness: awake and alert, oriented and patient cooperative Pain management: pain level controlled Vital Signs Assessment: post-procedure vital signs reviewed and stable Respiratory status: spontaneous breathing, nonlabored ventilation and respiratory function stable Cardiovascular status: blood pressure returned to baseline and stable Postop Assessment: adequate PO intake Anesthetic complications: no   No notable events documented.   Last Vitals:  Vitals:   05/19/22 2335 05/20/22 0403  BP: (!) 121/44 (!) 126/51  Pulse: 91 93  Resp: 16 18  Temp: 36.9 C (!) 38.2 C  SpO2: 100% 97%    Last Pain:  Vitals:   05/20/22 0600  TempSrc:   PainSc: 6                  Reed Breech

## 2022-05-20 NOTE — Hospital Course (Signed)
Kim Graham is a 71 y.o. female with past medical history significant for nicotine dependence, COPD, diabetes mellitus, history of hepatitis C status post treatment who is status post bilateral ureteroscopy and stent placement for kidney stone. Postoperatively, patient developed high fever of 104 and a tachycardia.  Patient is started on IV antibiotics for possible pyelonephritis.

## 2022-05-20 NOTE — Progress Notes (Addendum)
Urology Inpatient Progress Note  Subjective: She has been febrile this morning but overall her fever curve is downtrending.  She has had intermittent hypotension. Urine culture pending, blood cultures pending with no growth at <12 hours.  On antibiotics as below. Daily CBC and BMP have been ordered and are pending. She is spontaneously voiding dark yellow urine. She reports feeling better than yesterday but still not well.  She reports diffuse abdominal and back pain as well as dysuria.  She is tearful and states that she has been sick since February.  Anti-infectives: Anti-infectives (From admission, onward)    Start     Dose/Rate Route Frequency Ordered Stop   05/19/22 1800  cefTRIAXone (ROCEPHIN) 1 g in sodium chloride 0.9 % 100 mL IVPB  Status:  Discontinued        1 g 200 mL/hr over 30 Minutes Intravenous Every 24 hours 05/19/22 1702 05/19/22 1716   05/19/22 1800  fluconazole (DIFLUCAN) tablet 100 mg        100 mg Oral Daily 05/19/22 1702     05/19/22 1800  ceFEPIme (MAXIPIME) 2 g in sodium chloride 0.9 % 100 mL IVPB        2 g 200 mL/hr over 30 Minutes Intravenous Every 12 hours 05/19/22 1716     05/19/22 0915  gentamicin (GARAMYCIN) 250 mg in dextrose 5 % 50 mL IVPB        5 mg/kg  49.9 kg 112.5 mL/hr over 30 Minutes Intravenous  Once 05/19/22 0904 05/19/22 1000   05/19/22 0845  ampicillin (OMNIPEN) 2 g in sodium chloride 0.9 % 100 mL IVPB        2 g 300 mL/hr over 20 Minutes Intravenous  Once 05/19/22 0836 05/19/22 0902       Current Facility-Administered Medications  Medication Dose Route Frequency Provider Last Rate Last Admin   0.9 %  sodium chloride infusion   Intravenous Continuous Stoioff, Scott C, MD 50 mL/hr at 05/20/22 0600 Infusion Verify at 05/20/22 0600   ceFEPIme (MAXIPIME) 2 g in sodium chloride 0.9 % 100 mL IVPB  2 g Intravenous Q12H Riki Altes, MD   Stopped at 05/19/22 2000   fluconazole (DIFLUCAN) tablet 100 mg  100 mg Oral Daily Stoioff, Scott C, MD    100 mg at 05/19/22 2142   HYDROcodone-acetaminophen (NORCO/VICODIN) 5-325 MG per tablet 1 tablet  1 tablet Oral Q6H PRN Riki Altes, MD   1 tablet at 05/20/22 0407   methadone (DOLOPHINE) 10 MG/ML solution 100 mg  100 mg Oral Daily Stoioff, Scott C, MD       ondansetron (ZOFRAN) injection 4 mg  4 mg Intravenous Q4H PRN Stoioff, Scott C, MD       oxybutynin (DITROPAN) tablet 5 mg  5 mg Oral Q8H PRN Stoioff, Scott C, MD       Objective: Vital signs in last 24 hours: Temp:  [97.5 F (36.4 C)-104.1 F (40.1 C)] 98.4 F (36.9 C) (07/12 0837) Pulse Rate:  [85-123] 92 (07/12 0837) Resp:  [7-25] 14 (07/12 0837) BP: (96-169)/(43-85) 104/52 (07/12 0837) SpO2:  [86 %-100 %] 98 % (07/12 0837) Weight:  [32.2 kg] 32.2 kg (07/11 1947)  Intake/Output from previous day: 07/11 0701 - 07/12 0700 In: 3066 [P.O.:218; I.V.:2591.8; IV Piggyback:256.3] Out: 1800 [Urine:1800] Intake/Output this shift: No intake/output data recorded.  Physical Exam Vitals and nursing note reviewed.  Constitutional:      General: She is not in acute distress.    Appearance: She is  not ill-appearing, toxic-appearing or diaphoretic.  HENT:     Head: Normocephalic and atraumatic.  Pulmonary:     Effort: Pulmonary effort is normal. No respiratory distress.  Skin:    General: Skin is warm and dry.  Neurological:     Mental Status: She is alert and oriented to person, place, and time.  Psychiatric:        Mood and Affect: Mood normal.        Behavior: Behavior normal.    Lab Results:  Recent Labs    05/19/22 1624  WBC 13.9*  HGB 12.2  HCT 39.4  PLT 209   BMET Recent Labs    05/19/22 1624  NA 142  K 4.4  CL 112*  CO2 24  GLUCOSE 145*  BUN 15  CREATININE 1.23*  CALCIUM 8.6*   Assessment & Plan: 71 year old female who underwent staged bilateral ureteroscopy with laser lithotripsy and bilateral stent exchange with Dr. Lonna Cobb yesterday who was admitted after developing tachycardia and fevers in  the PACU.  We will continue empiric antibiotics and follow cultures for total of 14 days of therapy.  Given persistent fevers, we discussed staying at least 1 more night.  She is in agreement with this plan.  Oxybutynin as needed orders already in, will add Flomax daily for management of stent discomfort.  She will require outpatient cystoscopy stent removal with Dr. Lonna Cobb in 10 days.  Carman Ching, PA-C 05/20/2022

## 2022-05-21 DIAGNOSIS — D696 Thrombocytopenia, unspecified: Secondary | ICD-10-CM

## 2022-05-21 DIAGNOSIS — N200 Calculus of kidney: Secondary | ICD-10-CM | POA: Diagnosis not present

## 2022-05-21 DIAGNOSIS — N10 Acute tubulo-interstitial nephritis: Secondary | ICD-10-CM | POA: Diagnosis not present

## 2022-05-21 DIAGNOSIS — N202 Calculus of kidney with calculus of ureter: Secondary | ICD-10-CM | POA: Diagnosis not present

## 2022-05-21 DIAGNOSIS — N201 Calculus of ureter: Secondary | ICD-10-CM | POA: Diagnosis not present

## 2022-05-21 DIAGNOSIS — A419 Sepsis, unspecified organism: Secondary | ICD-10-CM | POA: Diagnosis not present

## 2022-05-21 DIAGNOSIS — N390 Urinary tract infection, site not specified: Secondary | ICD-10-CM | POA: Diagnosis not present

## 2022-05-21 DIAGNOSIS — E872 Acidosis, unspecified: Secondary | ICD-10-CM

## 2022-05-21 LAB — CBC
HCT: 34 % — ABNORMAL LOW (ref 36.0–46.0)
Hemoglobin: 10.2 g/dL — ABNORMAL LOW (ref 12.0–15.0)
MCH: 26.1 pg (ref 26.0–34.0)
MCHC: 30 g/dL (ref 30.0–36.0)
MCV: 87 fL (ref 80.0–100.0)
Platelets: 131 10*3/uL — ABNORMAL LOW (ref 150–400)
RBC: 3.91 MIL/uL (ref 3.87–5.11)
RDW: 17.2 % — ABNORMAL HIGH (ref 11.5–15.5)
WBC: 8.9 10*3/uL (ref 4.0–10.5)
nRBC: 0 % (ref 0.0–0.2)

## 2022-05-21 LAB — BASIC METABOLIC PANEL
Anion gap: 6 (ref 5–15)
BUN: 20 mg/dL (ref 8–23)
CO2: 21 mmol/L — ABNORMAL LOW (ref 22–32)
Calcium: 8.6 mg/dL — ABNORMAL LOW (ref 8.9–10.3)
Chloride: 113 mmol/L — ABNORMAL HIGH (ref 98–111)
Creatinine, Ser: 1.17 mg/dL — ABNORMAL HIGH (ref 0.44–1.00)
GFR, Estimated: 50 mL/min — ABNORMAL LOW (ref >60)
Glucose, Bld: 114 mg/dL — ABNORMAL HIGH (ref 70–99)
Potassium: 3.8 mmol/L (ref 3.5–5.1)
Sodium: 140 mmol/L (ref 135–145)

## 2022-05-21 LAB — PHOSPHORUS: Phosphorus: 2.4 mg/dL — ABNORMAL LOW (ref 2.5–4.6)

## 2022-05-21 LAB — GLUCOSE, CAPILLARY
Glucose-Capillary: 100 mg/dL — ABNORMAL HIGH (ref 70–99)
Glucose-Capillary: 160 mg/dL — ABNORMAL HIGH (ref 70–99)
Glucose-Capillary: 146 mg/dL — ABNORMAL HIGH (ref 70–99)

## 2022-05-21 LAB — MAGNESIUM: Magnesium: 1.9 mg/dL (ref 1.7–2.4)

## 2022-05-21 MED ORDER — FLUCONAZOLE 100 MG PO TABS: 100.0000 mg | ORAL_TABLET | Freq: Every day | ORAL | 0 refills | Status: AC

## 2022-05-21 MED ORDER — CEFDINIR 300 MG PO CAPS: 300.0000 mg | ORAL_CAPSULE | Freq: Two times a day (BID) | ORAL | 0 refills | Status: AC

## 2022-05-21 MED ORDER — TAMSULOSIN HCL 0.4 MG PO CAPS: 0.4000 mg | ORAL_CAPSULE | Freq: Every day | ORAL | 0 refills | Status: DC

## 2022-05-21 MED ORDER — OXYBUTYNIN CHLORIDE 5 MG PO TABS: 5.0000 mg | ORAL_TABLET | Freq: Three times a day (TID) | ORAL | 0 refills | Status: DC | PRN

## 2022-05-21 NOTE — Discharge Instructions (Addendum)
We will see you in clinic next Friday, 05/29/2022, for stent removal. In the meantime, I've sent in two antibiotics for you to take, diflucan and cefdinir. I'd also like you to take Flomax (tamsulosin) every day to help with your stent discomfort. If you are having crampy/spasmy pain in your back or bladder, you may also take oxybutynin.

## 2022-05-21 NOTE — Discharge Summary (Signed)
Date of admission: 05/19/2022  Date of discharge: 05/21/2022  Admission diagnosis: Right ureteral calculus, left nephrolithiasis  Discharge diagnosis: Same as above, sepsis  Secondary diagnoses:  Patient Active Problem List   Diagnosis Date Noted   Metabolic acidosis 46/28/6381   Acute pyelonephritis 05/20/2022   Protein-calorie malnutrition, severe ([place]) 05/20/2022   Chronic kidney disease, stage 3a ([place]) 05/20/2022   Malnutrition of moderate degree 05/20/2022   Sepsis ([place]) 05/20/2022   Nephrolithiasis 05/19/2022   Nicotine dependence 05/19/2022   Sepsis secondary to UTI [facility])    Ureteral calculus, left    Hydronephrosis of left kidney    Acute metabolic encephalopathy 77/09/6578   IFG (impaired fasting glucose) 10/14/2021   Methadone dependence ([place]) 04/04/2021   Hep C w/o coma, chronic ([place]) 07/11/2020   Diabetes mellitus type 2, diet-controlled ([place]) 04/15/2018   Depression, recurrent ([place]) 03/18/2018   Advance directive discussed with patient 06/18/2017   Chronic obstructive pulmonary disease ([place]) 06/16/2017   History and Physical: For full details, please see admission history and physical. Briefly, Kim Graham is a 71 y.o. year old patient admitted on 05/19/2022 following scheduled staged bilateral ureteroscopy with laser lithotripsy and stent exchange with Dr. Bernardo Heater after developing postoperative fever and tachycardia concerning for sepsis.  This morning she reports she is feeling better. Her abdominal and flank pain have significantly improved after starting Flomax yesterday. No acute concerns. She denies orthostasis.  Creatinine stable, 1.17. WBC count down today, 8.9. Urine culture has finalized with no growth. Blood cultures pending with no growth at 2 days. She is clinically improving on empiric diflucan and cefepime.  Physical Exam: Constitutional:  Alert and oriented, no acute distress, nontoxic appearing HEENT: [place], AT Cardiovascular: No clubbing, cyanosis,  or edema Respiratory: Normal respiratory effort, no increased work of breathing Skin: No rashes, bruises or suspicious lesions Neurologic: Grossly intact, no focal deficits, moving all 4 extremities Psychiatric: Normal mood and affect   Hospital Course: Patient tolerated the procedure well.  She was then transferred to the floor after developing postoperative fever and tachycardia in PACU.  Her hospital course was complicated by sepsis which responded favorably to empiric antimicrobials as above.  On POD#2 she had met discharge criteria: was eating a regular diet, was up and ambulating independently,  pain was well controlled, was voiding without a catheter, and was ready for discharge.  Laboratory values:  Recent Labs    05/19/22 1624 05/20/22 0857 05/21/22 0432  WBC 13.9* 16.8* 8.9  HGB 12.2 12.2 10.2*  HCT 39.4 40.4 34.0*   Recent Labs    05/19/22 1624 05/20/22 0857 05/21/22 0432  NA 142 141 140  K 4.4 4.8 3.8  CL 112* 114* 113*  CO2 24 21* 21*  GLUCOSE 145* 97 114*  BUN _0 CREATININE 1.23* 1.26* 1.17*  CALCIUM 8.6* 8.5* 8.6*   Results for orders placed or performed during the hospital encounter of 05/19/22  Urine Culture     Status: None   Collection Time: 05/19/22  3:56 PM   Specimen: In/Out Cath Urine  Result Value Ref Range Status   Specimen Description   Final    IN/OUT CATH URINE Performed at [facility], [address] [place], [place] [address]    Special Requests   Final    NONE Performed at [facility], [address] [place], [place] [address]    Culture   Final    NO GROWTH Performed at [facility], [place]  [address] [place], [place] [address]    Report Status 05/20/2022 FINAL  Final  Culture, blood (Routine X 2) w Reflex to ID Panel     Status: None (Preliminary result)   Collection Time: 05/19/22  4:24 PM   Specimen: BLOOD  Result Value Ref Range Status   Specimen Description BLOOD RIGHT ANTECUBITAL  Final    Special Requests   Final    BOTTLES DRAWN AEROBIC AND ANAEROBIC Blood Culture adequate volume   Culture   Final    NO GROWTH 2 DAYS Performed at [facility], [address] [place], [place] [address]    Report Status PENDING  Incomplete  Culture, blood (Routine X 2) w Reflex to ID Panel     Status: None (Preliminary result)   Collection Time: 05/19/22  6:47 PM   Specimen: BLOOD  Result Value Ref Range Status   Specimen Description BLOOD RIGHT ANTECUBITAL  Final   Special Requests   Final    BOTTLES DRAWN AEROBIC AND ANAEROBIC Blood Culture results may not be optimal due to an inadequate volume of blood received in culture bottles   Culture   Final    NO GROWTH 2 DAYS Performed at [facility], [address], [place], [place] [address]    Report Status PENDING  Incomplete   Disposition: Home  Discharge instruction: The patient was instructed to be ambulatory but told to refrain from heavy lifting, strenuous activity, or driving.   Discharge medications:  Allergies as of 05/21/2022   No Known Allergies      Medication List     STOP taking these medications    sulfamethoxazole-trimethoprim 800-160 MG tablet Commonly known as: BACTRIM DS       TAKE these medications    albuterol 108 (90 Base) MCG/ACT inhaler Commonly known as: VENTOLIN HFA Inhale 2 puffs into the lungs every 6 (six) hours as needed for wheezing or shortness of breath.   ARIPiprazole 20 MG tablet Commonly known as: ABILIFY Take 20 mg by mouth daily.   ascorbic acid 500 MG tablet Commonly known as: VITAMIN C Take by mouth.   cefdinir 300 MG capsule Commonly known as: OMNICEF Take 1 capsule (300 mg total) by mouth 2 (two) times daily for 10 days.   fluconazole 100 MG tablet Commonly known as: DIFLUCAN Take 1 tablet (100 mg total) by mouth daily for 10 days.   methadone 10 MG tablet Commonly known as: DOLOPHINE Take 100 mg by mouth daily.   Multi-Vitamin tablet Take 1  tablet by mouth daily.   oxybutynin 5 MG tablet Commonly known as: DITROPAN Take 1 tablet (5 mg total) by mouth every 8 (eight) hours as needed for up to 30 doses for bladder spasms.   polyethylene glycol 17 g packet Commonly known as: MIRALAX / GLYCOLAX Take 17 g by mouth daily.   potassium chloride SA 20 MEQ tablet Commonly known as: KLOR-CON M Take 2 tablets (40 mEq total) by mouth daily for 4 days.   senna-docusate 8.6-50 MG tablet Commonly known as: Senokot-S Take 1 tablet by mouth 2 (two) times daily.   tamsulosin 0.4 MG Caps capsule Commonly known as: FLOMAX Take 1 capsule (0.4 mg total) by mouth daily.   umeclidinium-vilanterol 62.5-25 MCG/ACT Aepb Commonly known as: ANORO ELLIPTA Inhale 1 puff into the lungs daily at 6 (six) AM.        Followup:   Follow-up Information     Abbie Sons, MD. Go on 05/29/2022.   Specialty: Urology  Why: for stent removal Contact information: [place] [address] [phone]

## 2022-05-21 NOTE — Plan of Care (Signed)
  Problem: Education: Goal: Knowledge of General Education information will improve Description: Including pain rating scale, medication(s)/side effects and non-pharmacologic comfort measures Outcome: Progressing   Problem: Health Behavior/Discharge Planning: Goal: Ability to manage health-related needs will improve Outcome: Progressing   Problem: Clinical Measurements: Goal: Ability to maintain clinical measurements within normal limits will improve Outcome: Progressing Goal: Will remain free from infection Outcome: Progressing Goal: Diagnostic test results will improve Outcome: Progressing Goal: Respiratory complications will improve Outcome: Progressing   Problem: Activity: Goal: Risk for activity intolerance will decrease Outcome: Progressing   Problem: Coping: Goal: Level of anxiety will decrease Outcome: Progressing   Problem: Nutrition: Goal: Adequate nutrition will be maintained Outcome: Not Progressing

## 2022-05-21 NOTE — Progress Notes (Signed)
  Progress Note   Patient: Kim Graham [id number]:[id number] DOB: [birth year] DOA: 05/19/2022     1 DOS: the patient was seen and examined on 05/21/2022   Brief hospital course:  Kim Graham is a 71 y.o. female with past medical history significant for nicotine dependence, COPD, diabetes mellitus, history of hepatitis C status post treatment who is status post bilateral ureteroscopy and stent placement for kidney stone. Postoperatively, patient developed high fever of 104 and a tachycardia.  Patient is started on IV antibiotics for possible pyelonephritis.  Assessment and Plan: Sepsis secondary to acute pyelonephritis Acute pyelonephritis Nephrolithiasis status post ureteral stent. Condition improved, all cultures still negative. Urology has placed discharge, stable from my standpoint for discharge.  Continue oral antibiotics.  Severe protein calorie malnutrition. Appetite is better  Chronic kidney disease stage IIIa. Renal function still stable.  Type 2 diabetes Resume home treatment.      Subjective:  Patient doing well, good appetite, no complaints  Physical Exam: Vitals:   05/20/22 2301 05/21/22 0016 05/21/22 0335 05/21/22 0758  BP: (!) 132/59 (!) 123/49 (!) 131/56 132/63  Pulse: 98 86 90 75  Resp: 20 15 15 16   Temp: 98.7 F (37.1 C) 98.1 F (36.7 C) 98.2 F (36.8 C) 98.2 F (36.8 C)  TempSrc: Oral     SpO2: 94% 94% 95% 97%  Weight:      Height:       General exam: Appears calm and comfortable, severely malnourished Respiratory system: Clear to auscultation. Respiratory effort normal. Cardiovascular system: S1 & S2 heard, RRR. No JVD, murmurs, rubs, gallops or clicks. No pedal edema. Gastrointestinal system: Abdomen is nondistended, soft and nontender. No organomegaly or masses felt. Normal bowel sounds heard. Central nervous system: Alert and oriented. No focal neurological deficits. Extremities: Symmetric 5 x 5 power. Skin: No rashes, lesions or ulcers Psychiatry:  Judgement and insight appear normal. Mood & affect appropriate.   Data Reviewed:  Lab results reviewed  Family Communication:   Disposition: Status is: Inpatient Remains inpatient appropriate because:   Planned Discharge Destination: Home    Time spent: 25 minutes  Author: , MD 05/21/2022 11:51 AM  For on call review [email or web address].

## 2022-05-21 NOTE — TOC Initial Note (Signed)
Transition of Care [facility]) - Initial/Assessment Note    Patient Details  Name: Kim Graham MRN: [id number] Date of Birth: 1951/09/16  Transition of Care [facility]) CM/SW Contact:    [name], RN Phone Number: 05/21/2022, 8:45 AM  Clinical Narrative:                  Transition of Care (TOC) Screening Note   Patient Details  Name: Kim Graham Date of Birth: 01/04/1951   Transition of Care [facility]) CM/SW Contact:    [name], RN Phone Number: 05/21/2022, 8:45 AM    Transition of Care Department [facility]) has reviewed patient and no TOC needs have been identified at this time. We will continue to monitor patient advancement through interdisciplinary progression rounds. If new patient transition needs arise, please place a TOC consult.          Patient Goals and CMS Choice        Expected Discharge Plan and Services                                                Prior Living Arrangements/Services                       Activities of Daily Living Home Assistive Devices/Equipment: Dentures (specify type), Eyeglasses ADL Screening (condition at time of admission) Patient's cognitive ability adequate to safely complete daily activities?: Yes Is the patient deaf or have difficulty hearing?: No Does the patient have difficulty seeing, even when wearing glasses/contacts?: No Does the patient have difficulty concentrating, remembering, or making decisions?: No Patient able to express need for assistance with ADLs?: No Does the patient have difficulty dressing or bathing?: No Independently performs ADLs?: Yes (appropriate for developmental age) Does the patient have difficulty walking or climbing stairs?: No Weakness of Legs: None Weakness of Arms/Hands: None  Permission Sought/Granted                  Emotional Assessment              Admission diagnosis:  Nephrolithiasis [N20.0] Sepsis (HCC) [A41.9] Patient Active Problem  List   Diagnosis Date Noted   Metabolic acidosis 05/21/2022   Acute pyelonephritis 05/20/2022   Protein-calorie malnutrition, severe (HCC) 05/20/2022   Chronic kidney disease, stage 3a (HCC) 05/20/2022   Malnutrition of moderate degree 05/20/2022   Sepsis (HCC) 05/20/2022   Nephrolithiasis 05/19/2022   Nicotine dependence 05/19/2022   Sepsis secondary to UTI [facility])    Ureteral calculus, left    Hydronephrosis of left kidney    Acute metabolic encephalopathy 02/07/2022   IFG (impaired fasting glucose) 10/14/2021   Methadone dependence (HCC) 04/04/2021   Hep C w/o coma, chronic (HCC) 07/11/2020   Diabetes mellitus type 2, diet-controlled (HCC) 04/15/2018   Depression, recurrent (HCC) 03/18/2018   Advance directive discussed with patient 06/18/2017   Chronic obstructive pulmonary disease (HCC) 06/16/2017   PCP:  Dorcas Carrow, DO Pharmacy:   [facility] - [place], [place] - [address] [place] [address] Phone: [phone] Fax: [phone]     Social Determinants of Health (SDOH) Interventions    Readmission Risk Interventions     No data to display

## 2022-05-21 NOTE — Plan of Care (Signed)
Patient discharged per MD orders at this time.All discharge instructions,education and medications reviewed with the patient.Pt expressed understanding and will comply with dc instructions.follow up appointments was also communicated to the patient.no verbal c/o or any ssx of distress.Pt was discharged home with [place] care per order.Pt's stored Methadone 100mg  x 3 liquid bottles was returned from pharmacy to Patient.Handover of meds was also witnessed by staff .Pt was transported home by boyfriend in a privately owned vehicle.

## 2022-05-22 ENCOUNTER — Telehealth: Payer: Self-pay

## 2022-05-22 ENCOUNTER — Telehealth: Payer: Self-pay | Admitting: *Deleted

## 2022-05-22 DIAGNOSIS — A419 Sepsis, unspecified organism: Secondary | ICD-10-CM

## 2022-05-22 NOTE — Telephone Encounter (Signed)
   Telephone encounter was:  Unsuccessful.  05/22/2022 Name: Kim Graham MRN: [id number] DOB: 1951-08-12  Unsuccessful outbound call made today to assist with:  Transportation Needs   Outreach Attempt:  1st Attempt  A HIPAA compliant voice message was left requesting a return call.  Instructed patient to call back at [phone] at their earliest convenience.  [facility] Guide, Embedded Care Coordination [facility]  [place], [place] [address]  Main Phone: [phone]  E-mail: [email or web address]  Website: [email or web address]

## 2022-05-22 NOTE — Telephone Encounter (Signed)
Transition Care Management Follow-up Telephone Call Date of discharge and from where: Discharged from [facility] on 05/21/22. How have you been since you were released from the hospital? Reports doing well. Any questions or concerns? No  Items Reviewed: Did the pt receive and understand the discharge instructions provided? Yes  Medications obtained and verified? Yes  Other? No  Any new allergies since your discharge? No  Dietary orders reviewed? Yes Do you have support at home? No  Reports being independent and not requiring support in the home.  Home Care and Equipment/Supplies: Were home health services ordered? No If so, what is the name of the agency? Not Applicable Has the agency set up a time to come to the patient's home? Not applicable Were any new equipment or medical supplies ordered?  No What is the name of the medical supply agency? Not Applicable Were you able to get the supplies/equipment? Not applicable Do you have any questions related to the use of the equipment or supplies? No  Functional Questionnaire: (I = Independent and D = Dependent) ADLs: Independent  Bathing/Dressing- Independent  Meal Prep- Independent  Eating- Independent  Maintaining continence- Independent  Transferring/Ambulation- Independent  Managing Meds- Independent  Follow up appointments reviewed:  PCP Hospital f/u appt confirmed? Yes  Scheduled to see Dr. Laural Benes on June 03, 2022 @ 1100. Specialist Hospital f/u appt confirmed? Yes  Scheduled to see Dr. Lonna Cobb on 05/29/22 @ 1230. Are transportation arrangements needed? Yes  If their condition worsens, is the pt aware to call PCP or go to the Emergency Dept.? Yes Was the patient provided with contact information for the PCP's office or ED? Yes Was to pt encouraged to call back with questions or concerns? Yes

## 2022-05-24 LAB — CULTURE, BLOOD (ROUTINE X 2)
Culture: NO GROWTH
Culture: NO GROWTH
Special Requests: ADEQUATE

## 2022-05-26 ENCOUNTER — Telehealth: Payer: Self-pay

## 2022-05-26 LAB — CALCULI, WITH PHOTOGRAPH (CLINICAL LAB)
Calcium Oxalate Dihydrate: 90 %
Calcium Oxalate Monohydrate: 10 %
Weight Calculi: 12 mg

## 2022-05-26 NOTE — Telephone Encounter (Signed)
   Telephone encounter was:  Successful.  05/26/2022 Name: AHNIYA MITCHUM MRN: [id number] DOB: 06-16-1951  [name] Strohm is a 71 y.o. year old female who is a primary care patient of Dorcas Carrow, DO . The community resource team was consulted for assistance with Transportation Needs   Care guide performed the following interventions: Follow up call placed to the patient to discuss status of referral.  CG arranged rides for 7/21 and 7/26 for patient per request with [employer] [phone] transportation) under [facility]. CG then educated patient on the confirmation numbers, phone number to call in rides, and how to book rides 3 days in advance. Patient advised she understood and has no further questions or concerns at this time.  05/29/2022 Pick up from home: 1145a-12p confirmation number [id number] Pick up from clinic: 1:15p-1:30pm confirmation number [id number]   06/03/2022 Pick up from home: 10:15a-1020a confirmation number [id number]  Pick up from clinic: 11:30a confirmation number [id number]  Follow Up Plan:  No further follow up planned at this time. The patient has been provided with needed resources.  [facility] Guide, Embedded Care Coordination [facility]  [place], [place] [address]  Main Phone: [phone]  E-mail: [email or web address]  Website: [email or web address]

## 2022-05-29 ENCOUNTER — Encounter: Payer: Self-pay | Admitting: Urology

## 2022-05-29 ENCOUNTER — Ambulatory Visit (INDEPENDENT_AMBULATORY_CARE_PROVIDER_SITE_OTHER): Payer: 59 | Admitting: Urology

## 2022-05-29 VITALS — BP 134/68 | HR 87 | Ht 64.0 in | Wt 110.0 lb

## 2022-05-29 DIAGNOSIS — Z466 Encounter for fitting and adjustment of urinary device: Secondary | ICD-10-CM

## 2022-05-29 LAB — URINALYSIS, COMPLETE
Bilirubin, UA: NEGATIVE
Glucose, UA: NEGATIVE
Ketones, UA: NEGATIVE
Nitrite, UA: NEGATIVE
Specific Gravity, UA: 1.015 (ref 1.005–1.030)
Urobilinogen, Ur: 0.2 mg/dL (ref 0.2–1.0)
pH, UA: 6 (ref 5.0–7.5)

## 2022-05-29 LAB — MICROSCOPIC EXAMINATION
RBC, Urine: >30 /hpf — AB (ref 0–2)
WBC, UA: >30 /hpf — AB (ref 0–5)

## 2022-05-29 MED ORDER — CIPROFLOXACIN HCL 500 MG PO TABS
500.0000 mg | ORAL_TABLET | Freq: Once | ORAL | Status: AC
  Administered 2022-05-29: 500 mg via ORAL

## 2022-05-29 NOTE — Progress Notes (Signed)
Indications: Patient is 71 y.o., who is s/p ureteroscopic removal of an 8 mm right mid ureteral calculus and multiple left renal calculi.  She was admitted postop for fever and tachycardia though urine and blood cultures were negative.  She has almost completed her antibiotics.  The patient is presenting today for stent removal.  Procedure:  Flexible Cystoscopy with stent removal (15056)  Timeout was performed and the correct patient, procedure and participants were identified.    Description:  The patient was prepped and draped in the usual sterile fashion. Flexible cystosopy was performed.  The stent was visualized, grasped, and removed intact without difficulty. The patient tolerated the procedure well.  A single dose of oral antibiotics was given.  Complications:  None  Plan:  Instructed to call for fever/flank pain post stent removal Follow-up 2-3 months with [facility]   Irineo Axon, MD

## 2022-06-03 ENCOUNTER — Encounter: Payer: Self-pay | Admitting: Family Medicine

## 2022-06-03 ENCOUNTER — Ambulatory Visit (INDEPENDENT_AMBULATORY_CARE_PROVIDER_SITE_OTHER): Payer: 59 | Admitting: Family Medicine

## 2022-06-03 VITALS — BP 126/58 | HR 60 | Temp 98.6°F | Wt 110.8 lb

## 2022-06-03 DIAGNOSIS — E876 Hypokalemia: Secondary | ICD-10-CM

## 2022-06-03 DIAGNOSIS — I952 Hypotension due to drugs: Secondary | ICD-10-CM

## 2022-06-03 DIAGNOSIS — N200 Calculus of kidney: Secondary | ICD-10-CM | POA: Diagnosis not present

## 2022-06-03 DIAGNOSIS — A419 Sepsis, unspecified organism: Secondary | ICD-10-CM | POA: Diagnosis not present

## 2022-06-03 DIAGNOSIS — N390 Urinary tract infection, site not specified: Secondary | ICD-10-CM

## 2022-06-03 LAB — MICROSCOPIC EXAMINATION

## 2022-06-03 LAB — URINALYSIS, ROUTINE W REFLEX MICROSCOPIC
Bilirubin, UA: NEGATIVE
Glucose, UA: NEGATIVE
Ketones, UA: NEGATIVE
Nitrite, UA: NEGATIVE
Specific Gravity, UA: 1.025 (ref 1.005–1.030)
Urobilinogen, Ur: 0.2 mg/dL (ref 0.2–1.0)
pH, UA: 6 (ref 5.0–7.5)

## 2022-06-03 NOTE — Assessment & Plan Note (Signed)
S/P stent removal. Continue to follow with urology. Rechecking labs today. Await results.

## 2022-06-03 NOTE — Assessment & Plan Note (Signed)
Resolved. Rechecking labs today. Await results.  

## 2022-06-03 NOTE — Progress Notes (Signed)
BP (!) 126/58   Pulse 60   Temp 98.6 F (37 C)   Wt 110 lb 12.8 oz (50.3 kg)   SpO2 98%   BMI 19.02 kg/m    Subjective:    Patient ID: Kim Graham, female    DOB: [birth year], 71 y.o.   MRN: [id number]  HPI: Kim Graham is a 71 y.o. female  Chief Complaint  Patient presents with   Hospitalization Follow-up    Patient was in the hospital for kidney stones and sepsis. Patient states she feels fine today.    Transition of Care Hospital Follow up.   Hospital/Facility: [facility] D/C Physician: Debroah Loop, PA-C D/C Date: 05/21/22  Records Requested: 06/03/22 Records Received: 06/03/22 Records Reviewed: 06/03/22  Diagnoses on Discharge: Sepsis duet o R ureteral stone and L nephrolithiasis  Date of interactive Contact within 48 hours of discharge: 05/22/22 Contact was through: phone  Date of 7 day or 14 day face-to-face visit:  06/03/22  within 14 days  Outpatient Encounter Medications as of 06/03/2022  Medication Sig Note   albuterol (VENTOLIN HFA) 108 (90 Base) MCG/ACT inhaler Inhale 2 puffs into the lungs every 6 (six) hours as needed for wheezing or shortness of breath.    ARIPiprazole (ABILIFY) 20 MG tablet Take 20 mg by mouth daily.    ascorbic acid (VITAMIN C) 500 MG tablet Take by mouth.    methadone (DOLOPHINE) 10 MG tablet Take 100 mg by mouth daily. 05/20/2022: Patient participates [facility] in [place], [place]. Her daily dose is 100 mg of liquid Methadone, concentration is 10 mg /mL. She takes it daily at 0700.   Multiple Vitamin (MULTI-VITAMIN) tablet Take 1 tablet by mouth daily.    polyethylene glycol (MIRALAX / GLYCOLAX) 17 g packet Take 17 g by mouth daily.    umeclidinium-vilanterol (ANORO ELLIPTA) 62.5-25 MCG/ACT AEPB Inhale 1 puff into the lungs daily at 6 (six) AM.    oxybutynin (DITROPAN) 5 MG tablet Take 1 tablet (5 mg total) by mouth every 8 (eight) hours as needed for up to 30 doses for bladder spasms. (Patient not taking: Reported  on 06/03/2022)    potassium chloride SA (KLOR-CON M) 20 MEQ tablet Take 2 tablets (40 mEq total) by mouth daily for 4 days.    [DISCONTINUED] senna-docusate (SENOKOT-S) 8.6-50 MG tablet Take 1 tablet by mouth 2 (two) times daily. (Patient not taking: Reported on 06/03/2022)    [DISCONTINUED] tamsulosin (FLOMAX) 0.4 MG CAPS capsule Take 1 capsule (0.4 mg total) by mouth daily. (Patient not taking: Reported on 06/03/2022)    No facility-administered encounter medications on file as of 06/03/2022.  Per Hospitalist: "History and Physical: For full details, please see admission history and physical. Briefly, Kim Graham is a 71 y.o. year old patient admitted on 05/19/2022 following scheduled staged bilateral ureteroscopy with laser lithotripsy and stent exchange with Dr. Bernardo Heater after developing postoperative fever and tachycardia concerning for sepsis.   This morning she reports she is feeling better. Her abdominal and flank pain have significantly improved after starting Flomax yesterday. No acute concerns. She denies orthostasis.   Creatinine stable, 1.17. WBC count down today, 8.9. Urine culture has finalized with no growth. Blood cultures pending with no growth at 2 days. She is clinically improving on empiric diflucan and cefepime.  Hospital Course: Patient tolerated the procedure well.  She was then transferred to the floor after developing postoperative fever and tachycardia in PACU.  Her hospital course was complicated by sepsis  which responded favorably to empiric antimicrobials as above.  On POD#2 she had met discharge criteria: was eating a regular diet, was up and ambulating independently,  pain was well controlled, was voiding without a catheter, and was ready for discharge."  Diagnostic Tests Reviewed: None done  Disposition: Home  Consults: Urology  Discharge Instructions: The patient was instructed to be ambulatory but told to refrain from heavy lifting, strenuous activity, or driving.    Disease/illness Education: Discussed today  Home Health/Community Services Discussions/Referrals: In place  Establishment or re-establishment of referral orders for community resources: N/A  Discussion with other health care providers: N/A  Assessment and Support of treatment regimen adherence: Good  Appointments Coordinated with: Patient  Education for [place]-management, independent living, and ADLs: Discussed today  Since getting out of the hospital Kim Graham notes that she has been feeling well. No CP, no SOB. No dizziness. Urine is better. No pain. She had her stents removed on Friday and is feeling much better.   HYPOTENSION Hypotension status: better  Satisfied with current treatment? yes Duration of hypertension: chronic BP monitoring frequency:  not checking BP medication side effects:  no Medication compliance: has been off meds and doing better Previous BP meds:HCTZ Aspirin: no Recurrent headaches: no Visual changes: no Palpitations: no Dyspnea: no Chest pain: no Lower extremity edema: no Dizzy/lightheaded: no   Relevant past medical, surgical, family and social history reviewed and updated as indicated. Interim medical history since our last visit reviewed. Allergies and medications reviewed and updated.  Review of Systems  Constitutional: Negative.   Respiratory: Negative.    Cardiovascular: Negative.   Gastrointestinal: Negative.   Musculoskeletal: Negative.   Psychiatric/Behavioral: Negative.      Per HPI unless specifically indicated above     Objective:    BP (!) 126/58   Pulse 60   Temp 98.6 F (37 C)   Wt 110 lb 12.8 oz (50.3 kg)   SpO2 98%   BMI 19.02 kg/m   Wt Readings from Last 3 Encounters:  06/03/22 110 lb 12.8 oz (50.3 kg)  05/29/22 110 lb (49.9 kg)  05/20/22 118 lb 6.2 oz (53.7 kg)    Physical Exam Vitals and nursing note reviewed.  Constitutional:      General: She is not in acute distress.    Appearance: Normal appearance.  She is underweight. She is not ill-appearing, toxic-appearing or diaphoretic.  HENT:     Head: Normocephalic and atraumatic.     Right Ear: External ear normal.     Left Ear: External ear normal.     Nose: Nose normal.     Mouth/Throat:     Mouth: Mucous membranes are moist.     Pharynx: Oropharynx is clear.  Eyes:     General: No scleral icterus.       Right eye: No discharge.        Left eye: No discharge.     Extraocular Movements: Extraocular movements intact.     Conjunctiva/sclera: Conjunctivae normal.     Pupils: Pupils are equal, round, and reactive to light.  Cardiovascular:     Rate and Rhythm: Normal rate and regular rhythm.     Pulses: Normal pulses.     Heart sounds: Normal heart sounds. No murmur heard.    No friction rub. No gallop.  Pulmonary:     Effort: Pulmonary effort is normal. No respiratory distress.     Breath sounds: Normal breath sounds. No stridor. No wheezing, rhonchi or rales.  Chest:  Chest wall: No tenderness.  Musculoskeletal:        General: Normal range of motion.     Cervical back: Normal range of motion and neck supple.  Skin:    General: Skin is warm and dry.     Capillary Refill: Capillary refill takes less than 2 seconds.     Coloration: Skin is not jaundiced or pale.     Findings: No bruising, erythema, lesion or rash.  Neurological:     General: No focal deficit present.     Mental Status: She is alert and oriented to person, place, and time. Mental status is at baseline.  Psychiatric:        Mood and Affect: Mood normal.        Behavior: Behavior normal.        Thought Content: Thought content normal.        Judgment: Judgment normal.     Results for orders placed or performed in visit on 05/29/22  Microscopic Examination   Urine  Result Value Ref Range   WBC, UA >30 (A) 0 - 5 /hpf   RBC, Urine >30 (A) 0 - 2 /hpf   Epithelial Cells (non renal) 0-10 0 - 10 /hpf   Casts Present (A) None seen /lpf   Cast Type Granular  casts (A) N/A   Bacteria, UA Moderate (A) None seen/Few  Urinalysis, Complete  Result Value Ref Range   Specific Gravity, UA 1.015 1.005 - 1.030   pH, UA 6.0 5.0 - 7.5   Color, UA Yellow Yellow   Appearance Ur Hazy (A) Clear   Leukocytes,UA 3+ (A) Negative   Protein,UA 2+ (A) Negative/Trace   Glucose, UA Negative Negative   Ketones, UA Negative Negative   RBC, UA 2+ (A) Negative   Bilirubin, UA Negative Negative   Urobilinogen, Ur 0.2 0.2 - 1.0 mg/dL   Nitrite, UA Negative Negative   Microscopic Examination See below:       Assessment & Plan:   Problem List Items Addressed This Visit       Genitourinary   Nephrolithiasis - Primary    S/P stent removal. Continue to follow with urology. Rechecking labs today. Await results.       Relevant Orders   CBC with Differential/Platelet   Urinalysis, Routine w reflex microscopic     Other   Sepsis secondary to UTI ([place])    Resolved. Rechecking labs today. Await results.       Sepsis ([place])    Resolved. Rechecking labs today. Await results.       Other Visit Diagnoses     Hypokalemia       Rechecking labs today. Await results.    Relevant Orders   Basic metabolic panel   Hypotension due to drugs       Resolved off HCTZ. Rechecking labs today. Await results.         Follow up plan: Return in about 5 months (around 11/03/2022) for follow up.  30 minutes spent with patient today

## 2022-06-04 ENCOUNTER — Encounter: Payer: Self-pay | Admitting: Family Medicine

## 2022-06-04 LAB — CBC WITH DIFFERENTIAL/PLATELET
Basophils Absolute: 0.1 10*3/uL (ref 0.0–0.2)
Basos: 1 %
EOS (ABSOLUTE): 0.1 10*3/uL (ref 0.0–0.4)
Eos: 2 %
Hematocrit: 37.8 % (ref 34.0–46.6)
Hemoglobin: 11.4 g/dL (ref 11.1–15.9)
Immature Grans (Abs): 0 10*3/uL (ref 0.0–0.1)
Immature Granulocytes: 0 %
Lymphocytes Absolute: 2.7 10*3/uL (ref 0.7–3.1)
Lymphs: 29 %
MCH: 25.9 pg — ABNORMAL LOW (ref 26.6–33.0)
MCHC: 30.2 g/dL — ABNORMAL LOW (ref 31.5–35.7)
MCV: 86 fL (ref 79–97)
Monocytes Absolute: 0.8 10*3/uL (ref 0.1–0.9)
Monocytes: 9 %
Neutrophils Absolute: 5.4 10*3/uL (ref 1.4–7.0)
Neutrophils: 59 %
Platelets: 234 10*3/uL (ref 150–450)
RBC: 4.41 x10E6/uL (ref 3.77–5.28)
RDW: 15.2 % (ref 11.7–15.4)
WBC: 9 10*3/uL (ref 3.4–10.8)

## 2022-06-04 LAB — BASIC METABOLIC PANEL
BUN/Creatinine Ratio: 9 — ABNORMAL LOW (ref 12–28)
BUN: 9 mg/dL (ref 8–27)
CO2: 25 mmol/L (ref 20–29)
Calcium: 9.4 mg/dL (ref 8.7–10.3)
Chloride: 107 mmol/L — ABNORMAL HIGH (ref 96–106)
Creatinine, Ser: 1.04 mg/dL — ABNORMAL HIGH (ref 0.57–1.00)
Glucose: 109 mg/dL — ABNORMAL HIGH (ref 70–99)
Potassium: 3.9 mmol/L (ref 3.5–5.2)
Sodium: 149 mmol/L — ABNORMAL HIGH (ref 134–144)
eGFR: 58 mL/min/{1.73_m2} — ABNORMAL LOW (ref >59)

## 2022-06-22 ENCOUNTER — Encounter: Payer: Self-pay | Admitting: Gastroenterology

## 2022-06-23 ENCOUNTER — Encounter
Admission: RE | Disposition: A | Discharge status: Home / Self Care | Payer: Self-pay | Source: Home / Self Care | Attending: Gastroenterology

## 2022-06-23 ENCOUNTER — Encounter: Payer: Self-pay | Admitting: Gastroenterology

## 2022-06-23 ENCOUNTER — Ambulatory Visit: Payer: 59 | Admitting: General Practice

## 2022-06-23 ENCOUNTER — Ambulatory Visit: Payer: 59

## 2022-06-23 ENCOUNTER — Ambulatory Visit
Admission: RE | Admit: 2022-06-23 | Discharge: 2022-06-23 | Disposition: A | Discharge status: Home / Self Care | Payer: 59 | Attending: Gastroenterology | Admitting: Gastroenterology

## 2022-06-23 DIAGNOSIS — Z87891 Personal history of nicotine dependence: Secondary | ICD-10-CM | POA: Diagnosis not present

## 2022-06-23 DIAGNOSIS — I10 Essential (primary) hypertension: Secondary | ICD-10-CM | POA: Insufficient documentation

## 2022-06-23 DIAGNOSIS — Z4659 Encounter for fitting and adjustment of other gastrointestinal appliance and device: Secondary | ICD-10-CM | POA: Diagnosis present

## 2022-06-23 DIAGNOSIS — Z4689 Encounter for fitting and adjustment of other specified devices: Secondary | ICD-10-CM | POA: Diagnosis not present

## 2022-06-23 DIAGNOSIS — Z9889 Other specified postprocedural states: Secondary | ICD-10-CM

## 2022-06-23 DIAGNOSIS — J449 Chronic obstructive pulmonary disease, unspecified: Secondary | ICD-10-CM | POA: Insufficient documentation

## 2022-06-23 DIAGNOSIS — K839 Disease of biliary tract, unspecified: Secondary | ICD-10-CM | POA: Diagnosis not present

## 2022-06-23 HISTORY — PX: ERCP: SHX5425

## 2022-06-23 SURGERY — ERCP, WITH INTERVENTION IF INDICATED
Anesthesia: General

## 2022-06-23 MED ORDER — DEXMEDETOMIDINE HCL 200 MCG/2ML IV SOLN
INTRAVENOUS | Status: DC | PRN
  Administered 2022-06-23: 8 ug via INTRAVENOUS

## 2022-06-23 MED ORDER — SODIUM CHLORIDE 0.9 % IV SOLN: INTRAVENOUS | Status: DC

## 2022-06-23 MED ORDER — LACTATED RINGERS IV SOLN: INTRAVENOUS | Status: DC

## 2022-06-23 MED ORDER — PROPOFOL 500 MG/50ML IV EMUL
INTRAVENOUS | Status: DC | PRN
  Administered 2022-06-23: 150 ug/kg/min via INTRAVENOUS

## 2022-06-23 MED ORDER — GLYCOPYRROLATE 0.2 MG/ML IJ SOLN
INTRAMUSCULAR | Status: DC | PRN
  Administered 2022-06-23: .1 mg via INTRAVENOUS

## 2022-06-23 MED ORDER — GLYCOPYRROLATE 0.2 MG/ML IJ SOLN
INTRAMUSCULAR | Status: AC
  Filled 2022-06-23: qty 1

## 2022-06-23 MED ORDER — PROPOFOL 10 MG/ML IV BOLUS
INTRAVENOUS | Status: DC | PRN
  Administered 2022-06-23: 50 mg via INTRAVENOUS

## 2022-06-23 MED ORDER — LIDOCAINE HCL (CARDIAC) PF 100 MG/5ML IV SOSY
PREFILLED_SYRINGE | INTRAVENOUS | Status: DC | PRN
  Administered 2022-06-23: 100 mg via INTRAVENOUS

## 2022-06-23 NOTE — Anesthesia Preprocedure Evaluation (Signed)
Anesthesia Evaluation  Patient identified by MRN, date of birth, ID band Patient awake    Reviewed: Allergy & Precautions, NPO status , Patient's Chart, lab work & pertinent test results  History of Anesthesia Complications Negative for: history of anesthetic complications  Airway Mallampati: III  TM Distance: >3 FB Neck ROM: full    Dental  (+) Poor Dentition, Missing, Dental Advidsory Given   Pulmonary neg sleep apnea, COPD, neg recent URI, Patient abstained from smoking., former smoker,    Pulmonary exam normal        Cardiovascular hypertension, (-) Past MI and (-) CABG negative cardio ROS Normal cardiovascular exam     Neuro/Psych PSYCHIATRIC DISORDERS negative neurological ROS     GI/Hepatic negative GI ROS, Neg liver ROS,   Endo/Other  negative endocrine ROS  Renal/GU Renal disease  negative genitourinary   Musculoskeletal   Abdominal   Peds  Hematology negative hematology ROS (+)   Anesthesia Other Findings Past Medical History: 12/09/2021: Acute cholecystitis No date: Bile leak No date: COPD (chronic obstructive pulmonary disease) (HCC) No date: Hepatitis No date: History of kidney stones No date: Hypertension  Past Surgical History: 12/09/2021: CYSTOSCOPY W/ URETERAL STENT PLACEMENT; Right     Comment:  Procedure: CYSTOSCOPY WITH RETROGRADE PYELOGRAM/URETERAL              STENT PLACEMENT;  Surgeon: Riki Altes, MD;                Location: [facility] ORS;  Service: Urology;  Laterality:               Right; 02/11/2022: CYSTOSCOPY/URETEROSCOPY/HOLMIUM LASER/STENT PLACEMENT;  Bilateral     Comment:  Procedure: CYSTOSCOPY/URETEROSCOPY/HOLMIUM LASER/STENT               PLACEMENT & RIGHT URETERAL STENT EXCHANGE;  Surgeon:               Riki Altes, MD;  Location: [facility] ORS;  Service:               Urology;  Laterality: Bilateral; 05/19/2022: CYSTOSCOPY/URETEROSCOPY/HOLMIUM LASER/STENT PLACEMENT;   Bilateral     Comment:  Procedure: CYSTOSCOPY/URETEROSCOPY/HOLMIUM LASER/STENT               PLACEMENT;  Surgeon: Riki Altes, MD;  Location:               [facility] ORS;  Service: Urology;  Laterality: Bilateral; 12/11/2021: ENDOSCOPIC RETROGRADE CHOLANGIOPANCREATOGRAPHY (ERCP) WITH  PROPOFOL; N/A     Comment:  Procedure: ENDOSCOPIC RETROGRADE               CHOLANGIOPANCREATOGRAPHY (ERCP) WITH PROPOFOL;  Surgeon:               [name], MD;  Location: [facility] ENDOSCOPY;  Service:               Endoscopy;  Laterality: N/A; 03/10/2022: ERCP; N/A     Comment:  Procedure: ENDOSCOPIC RETROGRADE               CHOLANGIOPANCREATOGRAPHY (ERCP);  Surgeon: [name],               MD;  Location: [facility] ENDOSCOPY;  Service: Endoscopy;                Laterality: N/A;  Stent removal  BMI    Body Mass Index: 18.88 kg/m      Reproductive/Obstetrics negative OB ROS  Anesthesia Physical Anesthesia Plan  ASA: 3  Anesthesia Plan: General   Post-op Pain Management:    Induction: Intravenous  PONV Risk Score and Plan: 3 and Propofol infusion and TIVA  Airway Management Planned: Natural Airway and Nasal Cannula  Additional Equipment:   Intra-op Plan:   Post-operative Plan:   Informed Consent: I have reviewed the patients History and Physical, chart, labs and discussed the procedure including the risks, benefits and alternatives for the proposed anesthesia with the patient or authorized representative who has indicated his/her understanding and acceptance.     Dental Advisory Given  Plan Discussed with: Anesthesiologist, CRNA and Surgeon  Anesthesia Plan Comments: (Patient consented for risks of anesthesia including but not limited to:  - adverse reactions to medications - risk of airway placement if required - damage to eyes, teeth, lips or other oral mucosa - nerve damage due to positioning  - sore throat or hoarseness - Damage  to heart, brain, nerves, lungs, other parts of body or loss of life  Patient voiced understanding.)        Anesthesia Quick Evaluation

## 2022-06-23 NOTE — Op Note (Signed)
[facility] Gastroenterology Patient Name: Kim Graham Procedure Date: 06/23/2022 9:50 AM MRN: [id number] Account #: [id number] Date of Birth: [birth year] Admit Type: Outpatient Age: 71 Room: [facility] ENDO ROOM 4 Gender: Female Note Status: Finalized Instrument Name: Selinda Eon [id number] Procedure:             ERCP Indications:           Biliary stent removal Providers:             Lucilla Lame MD, MD Medicines:             Propofol per Anesthesia Complications:         No immediate complications. Procedure:             Pre-Anesthesia Assessment:                        - Prior to the procedure, a History and Physical was                         performed, and patient medications and allergies were                         reviewed. The patient's tolerance of previous                         anesthesia was also reviewed. The risks and benefits                         of the procedure and the sedation options and risks                         were discussed with the patient. All questions were                         answered, and informed consent was obtained. Prior                         Anticoagulants: The patient has taken no previous                         anticoagulant or antiplatelet agents. ASA Grade                         Assessment: II - A patient with mild systemic disease.                         After reviewing the risks and benefits, the patient                         was deemed in satisfactory condition to undergo the                         procedure.                        After obtaining informed consent, the scope was passed                         under direct vision. Throughout the procedure, the  patient's blood pressure, pulse, and oxygen                         saturations were monitored continuously. The                         Duodenoscope was introduced through the mouth, and                         used to inject contrast  into and used to cannulate the                         bile duct. The ERCP was accomplished without                         difficulty. The patient tolerated the procedure well. Findings:      A biliary stent was visible on the scout film. One stent originating in       the common bile duct was emerging from the major papilla. One stent was       removed from the common bile duct using a snare. A wire was passed into       the biliary tree. The bile duct was deeply cannulated with the       short-nosed traction sphincterotome. Contrast was injected. I personally       interpreted the bile duct images. There was brisk flow of contrast       through the ducts. Image quality was excellent. Contrast extended to the       entire biliary tree. Extravasation of contrast originating from the       cystic duct was observed in a loculated collection that did not extend       any further. Impression:            - One stent from the common bile duct was seen in the                         major papilla.                        - Extravasation of contrast originating from the                         cystic duct was observed in a loculated collection                         that did not extend any further.                        - One stent was removed from the common bile duct. Recommendation:        - Watch for pancreatitis, bleeding, perforation, and                         cholangitis. Procedure Code(s):     --- Professional ---                        [phone], Endoscopic retrograde cholangiopancreatography                         (  ERCP); with removal of foreign body(s) or stent(s)                         from biliary/pancreatic duct(s)                        [phone], Endoscopic catheterization of the biliary                         ductal system, radiological supervision and                         interpretation Diagnosis Code(s):     --- Professional ---                        Z46.59, Encounter for  fitting and adjustment of other                         gastrointestinal appliance and device                        K83.9, Disease of biliary tract, unspecified CPT copyright 2019 American Medical Association. All rights reserved. The codes documented in this report are preliminary and upon coder review may  be revised to meet current compliance requirements. [name] MD, MD 06/23/2022 10:36:40 AM This report has been signed electronically. Number of Addenda: 0 Note Initiated On: 06/23/2022 9:50 AM Estimated Blood Loss:  Estimated blood loss: none.      [facility]

## 2022-06-23 NOTE — H&P (Signed)
[name], MD [facility] [address], [address] [place], [place] [address] [phone] Fax : [phone]  Primary Care Physician:  Dorcas Carrow, DO Primary Gastroenterologist:  Dr. Servando Snare  Pre-Procedure History & Physical: HPI:  Kim Graham is a 71 y.o. female is here for an ERCP.   Past Medical History:  Diagnosis Date   Acute cholecystitis 12/09/2021   Bile leak    COPD (chronic obstructive pulmonary disease) (HCC)    Hepatitis    History of kidney stones    Hypertension     Past Surgical History:  Procedure Laterality Date   CYSTOSCOPY W/ URETERAL STENT PLACEMENT Right 12/09/2021   Procedure: CYSTOSCOPY WITH RETROGRADE PYELOGRAM/URETERAL STENT PLACEMENT;  Surgeon: Riki Altes, MD;  Location: [facility] ORS;  Service: Urology;  Laterality: Right;   CYSTOSCOPY/URETEROSCOPY/HOLMIUM LASER/STENT PLACEMENT Bilateral 02/11/2022   Procedure: CYSTOSCOPY/URETEROSCOPY/HOLMIUM LASER/STENT PLACEMENT & RIGHT URETERAL STENT EXCHANGE;  Surgeon: Riki Altes, MD;  Location: [facility] ORS;  Service: Urology;  Laterality: Bilateral;   CYSTOSCOPY/URETEROSCOPY/HOLMIUM LASER/STENT PLACEMENT Bilateral 05/19/2022   Procedure: CYSTOSCOPY/URETEROSCOPY/HOLMIUM LASER/STENT PLACEMENT;  Surgeon: Riki Altes, MD;  Location: [facility] ORS;  Service: Urology;  Laterality: Bilateral;   ENDOSCOPIC RETROGRADE CHOLANGIOPANCREATOGRAPHY (ERCP) WITH PROPOFOL N/A 12/11/2021   Procedure: ENDOSCOPIC RETROGRADE CHOLANGIOPANCREATOGRAPHY (ERCP) WITH PROPOFOL;  Surgeon: [name], MD;  Location: [facility] ENDOSCOPY;  Service: Endoscopy;  Laterality: N/A;   ERCP N/A 03/10/2022   Procedure: ENDOSCOPIC RETROGRADE CHOLANGIOPANCREATOGRAPHY (ERCP);  Surgeon: [name], MD;  Location: [facility] ENDOSCOPY;  Service: Endoscopy;  Laterality: N/A;  Stent removal    Prior to Admission medications   Medication Sig Start Date End Date Taking? Authorizing Provider  ARIPiprazole (ABILIFY) 20 MG tablet Take 20 mg by mouth daily.   Yes  [provider]  ascorbic acid (VITAMIN C) 500 MG tablet Take by mouth.   Yes [provider]  methadone (DOLOPHINE) 10 MG tablet Take 100 mg by mouth daily.   Yes [provider]  Multiple Vitamin (MULTI-VITAMIN) tablet Take 1 tablet by mouth daily.   Yes [provider]  albuterol (VENTOLIN HFA) 108 (90 Base) MCG/ACT inhaler Inhale 2 puffs into the lungs every 6 (six) hours as needed for wheezing or shortness of breath. 04/12/22   Johnson, Megan P, DO  oxybutynin (DITROPAN) 5 MG tablet Take 1 tablet (5 mg total) by mouth every 8 (eight) hours as needed for up to 30 doses for bladder spasms. Patient not taking: Reported on 06/03/2022 05/21/22   Carman Ching, PA-C  polyethylene glycol (MIRALAX / GLYCOLAX) 17 g packet Take 17 g by mouth daily. 02/14/22   Enedina Finner, MD  potassium chloride SA (KLOR-CON M) 20 MEQ tablet Take 2 tablets (40 mEq total) by mouth daily for 4 days. 04/07/22 04/11/22  Lynn Ito, MD  umeclidinium-vilanterol (ANORO ELLIPTA) 62.5-25 MCG/ACT AEPB Inhale 1 puff into the lungs daily at 6 (six) AM. 10/14/21   Dorcas Carrow, DO    Allergies as of 04/17/2022   (No Known Allergies)    Family History  Problem Relation Age of Onset   Diabetes Mother    Cancer Father        Pancreatic   Diabetes Maternal Aunt    Diabetes Maternal Grandmother    Dementia Maternal Grandfather    Heart disease Maternal Grandfather     Social History   Socioeconomic History   Marital status: Single    Spouse name: Not on file   Number of children: Not on file   Years of education:  Not on file   Highest education level: Not on file  Occupational History   Not on file  Tobacco Use   Smoking status: Former    Packs/day: 0.50    Years: 49.00    Total pack years: 24.50    Types: Cigarettes    Quit date: 12/13/2018    Years since quitting: 3.5    Passive exposure: Past   Smokeless tobacco: Never  Vaping Use   Vaping Use: Never used   Substance and Sexual Activity   Alcohol use: Not Currently    Comment: On occasion   Drug use: No   Sexual activity: Not Currently  Other Topics Concern   Not on file  Social History Narrative   Not on file   Social Determinants of Health   Financial Resource Strain: Medium Risk (08/05/2021)   Overall Financial Resource Strain (CARDIA)    Difficulty of Paying Living Expenses: Somewhat hard  Food Insecurity: Food Insecurity Present (08/05/2021)   Hunger Vital Sign    Worried About Running Out of Food in the Last Year: Sometimes true    Ran Out of Food in the Last Year: Sometimes true  Transportation Needs: No Transportation Needs (08/05/2021)   PRAPARE - Administrator, Civil Service (Medical): No    Lack of Transportation (Non-Medical): No  Physical Activity: Insufficiently Active (08/05/2021)   Exercise Vital Sign    Days of Exercise per Week: 4 days    Minutes of Exercise per Session: 20 min  Stress: No Stress Concern Present (08/05/2021)   [employer] of Occupational Health - Occupational Stress Questionnaire    Feeling of Stress : Not at all  Social Connections: Moderately Isolated (08/05/2021)   Social Connection and Isolation Panel [NHANES]    Frequency of Communication with Friends and Family: More than three times a week    Frequency of Social Gatherings with Friends and Family: Three times a week    Attends Religious Services: Never    Active Member of Clubs or Organizations: No    Attends Engineer, structural: More than 4 times per year    Marital Status: Divorced  Intimate Partner Violence: Not At Risk (08/05/2021)   Humiliation, Afraid, Rape, and Kick questionnaire    Fear of Current or Ex-Partner: No    Emotionally Abused: No    Physically Abused: No    Sexually Abused: No    Review of Systems: See HPI, otherwise negative ROS  Physical Exam: BP 122/79   Pulse 82   Temp 98.6 F (37 C) (Temporal)   Resp 16   Ht 5\' 4"  (1.626 m)    Wt 49.9 kg   SpO2 97%   BMI 18.88 kg/m  General:   Alert,  pleasant and cooperative in NAD Head:  Normocephalic and atraumatic. Neck:  Supple; no masses or thyromegaly. Lungs:  Clear throughout to auscultation.    Heart:  Regular rate and rhythm. Abdomen:  Soft, nontender and nondistended. Normal bowel sounds, without guarding, and without rebound.   Neurologic:  Alert and  oriented x4;  grossly normal neurologically.  Impression/Plan: Kim Graham is here for an ERCP to be performed for bile duct leak  Risks, benefits, limitations, and alternatives regarding  ERCP have been reviewed with the patient.  Questions have been answered.  All parties agreeable.   [name], MD  06/23/2022, 10:30 AM

## 2022-06-23 NOTE — Transfer of Care (Signed)
Immediate Anesthesia Transfer of Care Note  Patient: Kim Graham  Procedure(s) Performed: ENDOSCOPIC RETROGRADE CHOLANGIOPANCREATOGRAPHY (ERCP)  Patient Location: Endoscopy Unit  Anesthesia Type:General  Level of Consciousness: drowsy  Airway & Oxygen Therapy: Patient Spontanous Breathing  Post-op Assessment: Report given to RN and Post -op Vital signs reviewed and stable  Post vital signs: Reviewed and stable  Last Vitals:  Vitals Value Taken Time  BP 119/63 06/23/22 1037  Temp 36.7 C 06/23/22 1036  Pulse 74 06/23/22 1037  Resp    SpO2 100 % 06/23/22 1037  Vitals shown include unvalidated device data.  Last Pain:  Vitals:   06/23/22 1036  TempSrc: Temporal  PainSc:          Complications: No notable events documented.

## 2022-06-24 ENCOUNTER — Encounter: Payer: Self-pay | Admitting: Gastroenterology

## 2022-06-24 NOTE — Anesthesia Postprocedure Evaluation (Signed)
Anesthesia Post Note  Patient: Kim Graham  Procedure(s) Performed: ENDOSCOPIC RETROGRADE CHOLANGIOPANCREATOGRAPHY (ERCP)  Patient location during evaluation: Endoscopy Anesthesia Type: General Level of consciousness: awake and alert Pain management: pain level controlled Vital Signs Assessment: post-procedure vital signs reviewed and stable Respiratory status: spontaneous breathing, nonlabored ventilation, respiratory function stable and patient connected to nasal cannula oxygen Cardiovascular status: blood pressure returned to baseline and stable Postop Assessment: no apparent nausea or vomiting Anesthetic complications: no   No notable events documented.   Last Vitals:  Vitals:   06/23/22 1036 06/23/22 1106  BP:  125/70  Pulse:    Resp:    Temp: 36.7 C   SpO2:      Last Pain:  Vitals:   06/23/22 1106  TempSrc:   PainSc: 0-No pain                 Stephanie Coup

## 2022-07-30 ENCOUNTER — Encounter: Payer: Self-pay | Admitting: Urology

## 2022-07-30 ENCOUNTER — Ambulatory Visit: Payer: 59 | Admitting: Urology

## 2022-08-10 ENCOUNTER — Telehealth: Payer: Self-pay | Admitting: Family Medicine

## 2022-08-10 NOTE — Telephone Encounter (Signed)
Copied from [place] [id number]. Topic: Medicare AWV >> Aug 10, 2022  4:11 PM Josephina Gip wrote: Reason for CRM: N/A unable to leave a message for patient to call back and schedule the Medicare Annual Wellness Visit (AWV) virtually or by telephone.  Last AWV 08/05/21  Please schedule at anytime with CFP-Nurse Health Advisor.   Any questions, please call me at [phone]

## 2022-11-27 ENCOUNTER — Other Ambulatory Visit: Payer: Self-pay | Admitting: Family Medicine

## 2022-11-30 NOTE — Telephone Encounter (Signed)
Requested Prescriptions  Pending Prescriptions Disp Refills   albuterol (VENTOLIN HFA) 108 (90 Base) MCG/ACT inhaler [Pharmacy Med Name: ALBUTEROL HFA 90 MCG INHALER] 8.5 each 0    Sig: Inhale 2 puffs into the lungs every 6 (six) hours as needed for wheezing or shortness of breath.     Pulmonology:  Beta Agonists 2 Passed - 11/27/2022 12:54 PM      Passed - Last BP in normal range    BP Readings from Last 1 Encounters:  06/23/22 125/70         Passed - Last Heart Rate in normal range    Pulse Readings from Last 1 Encounters:  06/23/22 82         Passed - Valid encounter within last 12 months    Recent Outpatient Visits           6 months ago Nephrolithiasis   [place], [place] P, DO   7 months ago Routine general medical examination at a health care facility   [facility], [place] P, DO   10 months ago Peripheral edema   [place], [place], DO   11 months ago Peripheral edema   [place], DO   11 months ago RUQ abdominal pain   [place] Jon Billings, NP

## 2023-01-20 ENCOUNTER — Ambulatory Visit: Payer: 59 | Admitting: Family Medicine

## 2023-01-21 ENCOUNTER — Encounter: Payer: Self-pay | Admitting: Physician Assistant

## 2023-01-21 ENCOUNTER — Ambulatory Visit (INDEPENDENT_AMBULATORY_CARE_PROVIDER_SITE_OTHER): Payer: 59 | Admitting: Physician Assistant

## 2023-01-21 VITALS — BP 136/74 | HR 93 | Temp 97.9°F | Wt 135.9 lb

## 2023-01-21 DIAGNOSIS — R319 Hematuria, unspecified: Secondary | ICD-10-CM | POA: Diagnosis not present

## 2023-01-21 DIAGNOSIS — R300 Dysuria: Secondary | ICD-10-CM

## 2023-01-21 DIAGNOSIS — N390 Urinary tract infection, site not specified: Secondary | ICD-10-CM | POA: Diagnosis not present

## 2023-01-21 LAB — URINALYSIS, ROUTINE W REFLEX MICROSCOPIC
Bilirubin, UA: NEGATIVE
Glucose, UA: NEGATIVE
Ketones, UA: NEGATIVE
Nitrite, UA: NEGATIVE
Specific Gravity, UA: 1.025 (ref 1.005–1.030)
Urobilinogen, Ur: 0.2 mg/dL (ref 0.2–1.0)
pH, UA: 5.5 (ref 5.0–7.5)

## 2023-01-21 LAB — MICROSCOPIC EXAMINATION: Bacteria, UA: NONE SEEN

## 2023-01-21 MED ORDER — PHENAZOPYRIDINE HCL 100 MG PO TABS: 100.0000 mg | ORAL_TABLET | Freq: Three times a day (TID) | ORAL | 0 refills | Status: DC | PRN

## 2023-01-21 MED ORDER — SULFAMETHOXAZOLE-TRIMETHOPRIM 800-160 MG PO TABS: 1.0000 | ORAL_TABLET | Freq: Two times a day (BID) | ORAL | 0 refills | Status: AC

## 2023-01-21 NOTE — Progress Notes (Signed)
Acute Office Visit   Patient: Kim Graham   DOB: 06/14/1951   72 y.o. Female  MRN: [id number] Visit Date: 01/21/2023  Today's healthcare provider: Dani Gobble Iliani Vejar, PA-C  Introduced myself to the patient as a Journalist, newspaper and provided education on APPs in clinical practice.    Chief Complaint  Patient presents with   Dysuria   Urinary Tract Infection    Patient says she became symptomatic Sunday with some burning with urination. Patient says she thought it was getting better this morning, but declines trying any over the counter medication.    Subjective    Dysuria  Associated symptoms include frequency. Pertinent negatives include no chills, flank pain, hematuria or urgency.  Urinary Tract Infection  Associated symptoms include frequency. Pertinent negatives include no chills, flank pain, hematuria or urgency.   HPI     Urinary Tract Infection    Additional comments: Patient says she became symptomatic Sunday with some burning with urination. Patient says she thought it was getting better this morning, but declines trying any over the counter medication.       Last edited by Irena Reichmann, [place] on 01/21/2023  9:36 AM.        Dysuria  Onset: sudden  Duration: started Sun Associated symptoms: reports burning when she urinates, some increased urinary frequency, urinary hesitancy and feeling of incomplete voiding She reports urine appearance has changed to dark and cloudy- is usually clear  She also reports malodorous urine  She denies flank pain, suprapubic pain, fever, chills, hematuria  Interventions: nothing, has not taken AZO  She reports previous history of kidney stones She states this feels similar to previous UTI she had several years ago   Medications: Outpatient Medications Prior to Visit  Medication Sig   albuterol (VENTOLIN HFA) 108 (90 Base) MCG/ACT inhaler Inhale 2 puffs into the lungs every 6 (six) hours as needed for wheezing or shortness of breath.    ARIPiprazole (ABILIFY) 30 MG tablet Take 30 mg by mouth daily.   ascorbic acid (VITAMIN C) 500 MG tablet Take by mouth.   methadone (DOLOPHINE) 10 MG tablet Take 100 mg by mouth daily.   Multiple Vitamin (MULTI-VITAMIN) tablet Take 1 tablet by mouth daily.   oxybutynin (DITROPAN) 5 MG tablet Take 1 tablet (5 mg total) by mouth every 8 (eight) hours as needed for up to 30 doses for bladder spasms.   polyethylene glycol (MIRALAX / GLYCOLAX) 17 g packet Take 17 g by mouth daily.   umeclidinium-vilanterol (ANORO ELLIPTA) 62.5-25 MCG/ACT AEPB Inhale 1 puff into the lungs daily at 6 (six) AM.   potassium chloride SA (KLOR-CON M) 20 MEQ tablet Take 2 tablets (40 mEq total) by mouth daily for 4 days.   [DISCONTINUED] ARIPiprazole (ABILIFY) 20 MG tablet Take 20 mg by mouth daily.   No facility-administered medications prior to visit.    Review of Systems  Constitutional:  Negative for chills and fever.  Gastrointestinal:  Negative for abdominal pain.  Genitourinary:  Positive for dysuria and frequency. Negative for decreased urine volume, difficulty urinating, enuresis, flank pain, hematuria, urgency, vaginal bleeding, vaginal discharge and vaginal pain.  Neurological:  Negative for dizziness, light-headedness and headaches.  Psychiatric/Behavioral:  Negative for agitation, behavioral problems, confusion and hallucinations.        Objective    BP 136/74   Pulse 93   Temp 97.9 F (36.6 C) (Oral)   Wt 135 lb 14.4 oz (61.6 kg)  SpO2 93%   BMI 23.33 kg/m    Physical Exam Vitals reviewed.  Constitutional:      General: She is awake.     Appearance: Normal appearance. She is well-developed and well-groomed.  HENT:     Head: Normocephalic and atraumatic.  Pulmonary:     Effort: Pulmonary effort is normal.  Abdominal:     General: Abdomen is flat. Bowel sounds are normal.     Palpations: Abdomen is soft.     Tenderness: There is no abdominal tenderness. There is no right CVA tenderness  or left CVA tenderness.  Neurological:     General: No focal deficit present.     Mental Status: She is alert and oriented to person, place, and time. Mental status is at baseline.  Psychiatric:        Mood and Affect: Mood normal.        Behavior: Behavior normal. Behavior is cooperative.        Thought Content: Thought content normal.        Judgment: Judgment normal.       Results for orders placed or performed in visit on 01/21/23  Microscopic Examination   Urine  Result Value Ref Range   WBC, UA >30W 0 - 5 /hpf   RBC, Urine 3-10 (A) 0 - 2 /hpf   Epithelial Cells (non renal) 0-10 0 - 10 /hpf   Mucus, UA Present (A) Not Estab.   Bacteria, UA None seen None seen/Few  Urinalysis, Routine w reflex microscopic  Result Value Ref Range   Specific Gravity, UA 1.025 1.005 - 1.030   pH, UA 5.5 5.0 - 7.5   Color, UA Yellow Yellow   Appearance Ur Turbid (A) Clear   Leukocytes,UA 1+ (A) Negative   Protein,UA 1+ (A) Negative/Trace   Glucose, UA Negative Negative   Ketones, UA Negative Negative   RBC, UA 2+ (A) Negative   Bilirubin, UA Negative Negative   Urobilinogen, Ur 0.2 0.2 - 1.0 mg/dL   Nitrite, UA Negative Negative   Microscopic Examination See below:     Assessment & Plan      No follow-ups on file.      Problem List Items Addressed This Visit   None Visit Diagnoses     Urinary tract infection with hematuria, site unspecified    -  Primary Acute, new concern Patient reports symptoms comprised of the following: dysuria, pressure, incomplete voiding, increased urinary frequency since Sat Results of UA are consistent with UTI - urine sample sent for culture to determine causative organism and susceptibility- results to dictate further management  Recommend starting Bactrim PO BID x 5 days pending culture results  Will provide script - discussed importance of finishing entire course of abx and staying well hydrated while recovering from UTI Reviewed ED precautions  with patient Follow up as needed for persistent or worsening symptoms    Relevant Medications   phenazopyridine (PYRIDIUM) 100 MG tablet   sulfamethoxazole-trimethoprim (BACTRIM DS) 800-160 MG tablet   Other Relevant Orders   Urine Culture   Dysuria       Relevant Medications   phenazopyridine (PYRIDIUM) 100 MG tablet   Other Relevant Orders   Urinalysis, Routine w reflex microscopic (Completed)        No follow-ups on file.   I, Sha Burling E Karrington Mccravy, PA-C, have reviewed all documentation for this visit. The documentation on 01/21/23 for the exam, diagnosis, procedures, and orders are all accurate and complete.   Shayne Diguglielmo, MHS,  PA-C [facility]

## 2023-01-21 NOTE — Progress Notes (Signed)
Your urinalysis was concerning for signs of UTI I have sent in an antibiotic called Bactrim to assist with treating this and a urine sample was sent off for culture to make sure this will be effective. Please start the Bactrim promptly and continue taking it until it is finished completely. Let us know if you have any concerns or side effects.

## 2023-01-24 LAB — URINE CULTURE

## 2023-01-25 NOTE — Progress Notes (Signed)
Your urine culture shows that your UTI was caused by a bacteria called Proteus Mirabilis  It was susceptible to the Bactrim that was sent in for you. Please make sure you finish the entire course of the antibiotic to make sure the infection is adequately treated. Let us know if you have further questions or concerns

## 2023-02-10 ENCOUNTER — Ambulatory Visit: Payer: 59 | Admitting: Family Medicine

## 2023-03-09 ENCOUNTER — Telehealth: Payer: Self-pay | Admitting: Family Medicine

## 2023-03-09 NOTE — Telephone Encounter (Signed)
Copied from CRM [id number]. Topic: Medicare AWV >> Mar 09, 2023  9:59 AM Payton Doughty wrote: Reason for CRM: Called patient to schedule Medicare Annual Wellness Visit (AWV). No voicemail available to leave a message.  Last date of AWV: 08/05/21  Please schedule an appointment at any time with Kennedy Bucker, LPN  .  If any questions, please contact me.  Thank you ,  Verlee Rossetti; Care Guide Ambulatory Clinical Support [facility] Direct Dial: [phone]

## 2023-03-19 ENCOUNTER — Telehealth: Payer: Self-pay | Admitting: Family Medicine

## 2023-03-19 NOTE — Telephone Encounter (Signed)
Copied from CRM [id number]. Topic: Medicare AWV >> Mar 19, 2023 10:03 AM Payton Doughty wrote: Reason for CRM: Called patient to schedule Medicare Annual Wellness Visit (AWV). No voicemail available to leave a message.  Last date of AWV: 08/05/21  Please schedule an appointment at any time with Kennedy Bucker, LPN  .  If any questions, please contact me.  Thank you ,  Verlee Rossetti; Care Guide Ambulatory Clinical Support [facility] Direct Dial: [phone]

## 2023-03-26 ENCOUNTER — Encounter: Payer: Self-pay | Admitting: Family Medicine

## 2023-03-26 ENCOUNTER — Ambulatory Visit (INDEPENDENT_AMBULATORY_CARE_PROVIDER_SITE_OTHER): Payer: 59 | Admitting: Family Medicine

## 2023-03-26 VITALS — BP 117/70 | HR 71 | Temp 98.4°F | Wt 136.6 lb

## 2023-03-26 DIAGNOSIS — B182 Chronic viral hepatitis C: Secondary | ICD-10-CM

## 2023-03-26 DIAGNOSIS — Z13820 Encounter for screening for osteoporosis: Secondary | ICD-10-CM

## 2023-03-26 DIAGNOSIS — F339 Major depressive disorder, recurrent, unspecified: Secondary | ICD-10-CM | POA: Diagnosis not present

## 2023-03-26 DIAGNOSIS — N1831 Chronic kidney disease, stage 3a: Secondary | ICD-10-CM

## 2023-03-26 DIAGNOSIS — J441 Chronic obstructive pulmonary disease with (acute) exacerbation: Secondary | ICD-10-CM

## 2023-03-26 DIAGNOSIS — R238 Other skin changes: Secondary | ICD-10-CM

## 2023-03-26 DIAGNOSIS — R8281 Pyuria: Secondary | ICD-10-CM

## 2023-03-26 DIAGNOSIS — R7301 Impaired fasting glucose: Secondary | ICD-10-CM

## 2023-03-26 DIAGNOSIS — E43 Unspecified severe protein-calorie malnutrition: Secondary | ICD-10-CM

## 2023-03-26 DIAGNOSIS — Z1231 Encounter for screening mammogram for malignant neoplasm of breast: Secondary | ICD-10-CM

## 2023-03-26 DIAGNOSIS — D696 Thrombocytopenia, unspecified: Secondary | ICD-10-CM

## 2023-03-26 DIAGNOSIS — R600 Localized edema: Secondary | ICD-10-CM

## 2023-03-26 DIAGNOSIS — Z0000 Encounter for general adult medical examination without abnormal findings: Secondary | ICD-10-CM | POA: Diagnosis not present

## 2023-03-26 DIAGNOSIS — F1120 Opioid dependence, uncomplicated: Secondary | ICD-10-CM

## 2023-03-26 DIAGNOSIS — F17210 Nicotine dependence, cigarettes, uncomplicated: Secondary | ICD-10-CM

## 2023-03-26 LAB — MICROALBUMIN, URINE WAIVED
Creatinine, Urine Waived: 50 mg/dL (ref 10–300)
Microalb, Ur Waived: 80 mg/L — ABNORMAL HIGH (ref 0–19)

## 2023-03-26 LAB — URINALYSIS, ROUTINE W REFLEX MICROSCOPIC
Bilirubin, UA: NEGATIVE
Glucose, UA: NEGATIVE
Ketones, UA: NEGATIVE
Nitrite, UA: NEGATIVE
Protein,UA: NEGATIVE
RBC, UA: NEGATIVE
Specific Gravity, UA: 1.015 (ref 1.005–1.030)
Urobilinogen, Ur: 0.2 mg/dL (ref 0.2–1.0)
pH, UA: 5.5 (ref 5.0–7.5)

## 2023-03-26 LAB — MICROSCOPIC EXAMINATION: Bacteria, UA: NONE SEEN

## 2023-03-26 LAB — BAYER DCA HB A1C WAIVED: HB A1C (BAYER DCA - WAIVED): 6.2 % — ABNORMAL HIGH (ref 4.8–5.6)

## 2023-03-26 MED ORDER — ALBUTEROL SULFATE HFA 108 (90 BASE) MCG/ACT IN AERS: 2.0000 | INHALATION_SPRAY | Freq: Four times a day (QID) | RESPIRATORY_TRACT | 6 refills | Status: DC | PRN

## 2023-03-26 MED ORDER — TRELEGY ELLIPTA 100-62.5-25 MCG/ACT IN AEPB: 1.0000 | INHALATION_SPRAY | Freq: Every day | RESPIRATORY_TRACT | 11 refills | Status: DC

## 2023-03-26 MED ORDER — ARIPIPRAZOLE 30 MG PO TABS: 30.0000 mg | ORAL_TABLET | Freq: Every day | ORAL | 1 refills | Status: DC

## 2023-03-26 MED ORDER — ALBUTEROL SULFATE (2.5 MG/3ML) 0.083% IN NEBU
2.5000 mg | INHALATION_SOLUTION | Freq: Once | RESPIRATORY_TRACT | Status: AC
  Administered 2023-03-26: 2.5 mg via RESPIRATORY_TRACT

## 2023-03-26 NOTE — Assessment & Plan Note (Signed)
Under good control on current regimen. Continue current regimen. Continue to monitor. Call with any concerns. Refills given. Labs drawn today.   

## 2023-03-26 NOTE — Progress Notes (Signed)
BP 117/70   Pulse 71   Temp 98.4 F (36.9 C) (Oral)   Wt 136 lb 9.6 oz (62 kg)   SpO2 97%   BMI 23.45 kg/m    Subjective:    Patient ID: Kim Graham, female    DOB: Oct 11, 1951, 72 y.o.   MRN: [id number]  HPI: Kim Graham is a 72 y.o. female presenting on 03/26/2023 for comprehensive medical examination. Current medical complaints include:  Impaired Fasting Glucose HbA1C:  Lab Results  Component Value Date   HGBA1C 5.2 04/09/2022   Duration of elevated blood sugar: chronic  Polydipsia: no Polyuria: no Weight change: no Visual disturbance: no Glucose Monitoring: no Diabetic Education: Not Completed Family history of diabetes: no  COPD COPD status: uncontrolled Satisfied with current treatment?: no Oxygen use: no Dyspnea frequency: 1-2x a week Cough frequency: occasionally Rescue inhaler frequency:  every other day Limitation of activity: yes Productive cough: no Pneumovax: Up to Date Influenza: Not up to Date  DEPRESSION Mood status: stable Satisfied with current treatment?: yes Symptom severity: mild  Duration of current treatment : chronic Side effects: no Medication compliance: excellent compliance Psychotherapy/counseling: no  Previous psychiatric medications: abilify Depressed mood: no Anxious mood: no Anhedonia: no Significant weight loss or gain: no Insomnia: no  Fatigue: no Feelings of worthlessness or guilt: no Impaired concentration/indecisiveness: no Suicidal ideations: no Hopelessness: no Crying spells: no    03/26/2023    2:08 PM 06/03/2022   11:07 AM 04/09/2022    2:57 PM 04/07/2022   10:58 AM 03/05/2022   11:21 AM  Depression screen PHQ 2/9  Decreased Interest 0 1 1 0 0  Down, Depressed, Hopeless 0 1 1 0 0  PHQ - 2 Score 0 2 2 0 0  Altered sleeping 0 0 0    Tired, decreased energy 1 0 2    Change in appetite 0 0 1    Feeling bad or failure about yourself  0 1 0    Trouble concentrating 0 0 1    Moving slowly or  fidgety/restless 0 0 0    Suicidal thoughts 0 0 0    PHQ-9 Score 1 3 6     Difficult doing work/chores Not difficult at all  Not difficult at all      She currently lives with: alone Menopausal Symptoms: no  Functional Status Survey: Is the patient deaf or have difficulty hearing?: No Does the patient have difficulty seeing, even when wearing glasses/contacts?: No Does the patient have difficulty concentrating, remembering, or making decisions?: No Does the patient have difficulty walking or climbing stairs?: No Does the patient have difficulty dressing or bathing?: No Does the patient have difficulty doing errands alone such as visiting a doctor's office or shopping?: No     03/26/2023    2:08 PM 04/09/2022    2:57 PM 04/07/2022   10:58 AM 03/05/2022   11:21 AM 01/08/2022    1:55 PM  Fall Risk   Falls in the past year? 0 0 0 0 0  Number falls in past yr: 0 0   0  Injury with Fall? 0 0   1  Risk for fall due to : No Fall Risks No Fall Risks No Fall Risks No Fall Risks No Fall Risks;History of fall(s)  Follow up Falls evaluation completed Falls evaluation completed Falls evaluation completed Falls evaluation completed Falls evaluation completed    Depression Screen    03/26/2023    2:08 PM 06/03/2022   11:07  AM 04/09/2022    2:57 PM 04/07/2022   10:58 AM 03/05/2022   11:21 AM  Depression screen PHQ 2/9  Decreased Interest 0 1 1 0 0  Down, Depressed, Hopeless 0 1 1 0 0  PHQ - 2 Score 0 2 2 0 0  Altered sleeping 0 0 0    Tired, decreased energy 1 0 2    Change in appetite 0 0 1    Feeling bad or failure about yourself  0 1 0    Trouble concentrating 0 0 1    Moving slowly or fidgety/restless 0 0 0    Suicidal thoughts 0 0 0    PHQ-9 Score 1 3 6     Difficult doing work/chores Not difficult at all  Not difficult at all       Advanced Directives Does patient have a HCPOA?    no If yes, name and contact information:  Does patient have a living will or MOST form?  no  Past  Medical History:  Past Medical History:  Diagnosis Date   Acute cholecystitis 12/09/2021   Bile leak    COPD (chronic obstructive pulmonary disease) (HCC)    Hepatitis    History of kidney stones    Hypertension    Sepsis secondary to UTI [facility])     Surgical History:  Past Surgical History:  Procedure Laterality Date   CYSTOSCOPY W/ URETERAL STENT PLACEMENT Right 12/09/2021   Procedure: CYSTOSCOPY WITH RETROGRADE PYELOGRAM/URETERAL STENT PLACEMENT;  Surgeon: Riki Altes, MD;  Location: [facility] ORS;  Service: Urology;  Laterality: Right;   CYSTOSCOPY/URETEROSCOPY/HOLMIUM LASER/STENT PLACEMENT Bilateral 02/11/2022   Procedure: CYSTOSCOPY/URETEROSCOPY/HOLMIUM LASER/STENT PLACEMENT & RIGHT URETERAL STENT EXCHANGE;  Surgeon: Riki Altes, MD;  Location: [facility] ORS;  Service: Urology;  Laterality: Bilateral;   CYSTOSCOPY/URETEROSCOPY/HOLMIUM LASER/STENT PLACEMENT Bilateral 05/19/2022   Procedure: CYSTOSCOPY/URETEROSCOPY/HOLMIUM LASER/STENT PLACEMENT;  Surgeon: Riki Altes, MD;  Location: [facility] ORS;  Service: Urology;  Laterality: Bilateral;   ENDOSCOPIC RETROGRADE CHOLANGIOPANCREATOGRAPHY (ERCP) WITH PROPOFOL N/A 12/11/2021   Procedure: ENDOSCOPIC RETROGRADE CHOLANGIOPANCREATOGRAPHY (ERCP) WITH PROPOFOL;  Surgeon: [name], MD;  Location: [facility] ENDOSCOPY;  Service: Endoscopy;  Laterality: N/A;   ERCP N/A 03/10/2022   Procedure: ENDOSCOPIC RETROGRADE CHOLANGIOPANCREATOGRAPHY (ERCP);  Surgeon: [name], MD;  Location: [facility] ENDOSCOPY;  Service: Endoscopy;  Laterality: N/A;  Stent removal   ERCP N/A 06/23/2022   Procedure: ENDOSCOPIC RETROGRADE CHOLANGIOPANCREATOGRAPHY (ERCP);  Surgeon: [name], MD;  Location: [facility] ENDOSCOPY;  Service: Endoscopy;  Laterality: N/A;  stent removal    Medications:  Current Outpatient Medications on File Prior to Visit  Medication Sig   ascorbic acid (VITAMIN C) 500 MG tablet Take by mouth.   methadone (DOLOPHINE) 10 MG tablet Take 100 mg by mouth daily.    Multiple Vitamin (MULTI-VITAMIN) tablet Take 1 tablet by mouth daily.   No current facility-administered medications on file prior to visit.    Allergies:  No Known Allergies  Social History:  Social History   Socioeconomic History   Marital status: Single    Spouse name: Not on file   Number of children: Not on file   Years of education: Not on file   Highest education level: Not on file  Occupational History   Not on file  Tobacco Use   Smoking status: Former    Packs/day: 0.50    Years: 49.00    Additional pack years: 0.00    Total pack years: 24.50    Types: Cigarettes    Quit date: 12/13/2018  Years since quitting: 4.2    Passive exposure: Past   Smokeless tobacco: Never  Vaping Use   Vaping Use: Never used  Substance and Sexual Activity   Alcohol use: Not Currently    Comment: On occasion   Drug use: No   Sexual activity: Not Currently  Other Topics Concern   Not on file  Social History Narrative   Not on file   Social Determinants of Health   Financial Resource Strain: Medium Risk (08/05/2021)   Overall Financial Resource Strain (CARDIA)    Difficulty of Paying Living Expenses: Somewhat hard  Food Insecurity: Food Insecurity Present (08/05/2021)   Hunger Vital Sign    Worried About Running Out of Food in the Last Year: Sometimes true    Ran Out of Food in the Last Year: Sometimes true  Transportation Needs: No Transportation Needs (08/05/2021)   PRAPARE - Administrator, Civil Service (Medical): No    Lack of Transportation (Non-Medical): No  Physical Activity: Insufficiently Active (08/05/2021)   Exercise Vital Sign    Days of Exercise per Week: 4 days    Minutes of Exercise per Session: 20 min  Stress: No Stress Concern Present (08/05/2021)   [employer] of Occupational Health - Occupational Stress Questionnaire    Feeling of Stress : Not at all  Social Connections: Moderately Isolated (08/05/2021)   Social Connection and Isolation  Panel [NHANES]    Frequency of Communication with Friends and Family: More than three times a week    Frequency of Social Gatherings with Friends and Family: Three times a week    Attends Religious Services: Never    Active Member of Clubs or Organizations: No    Attends Engineer, structural: More than 4 times per year    Marital Status: Divorced  Intimate Partner Violence: Not At Risk (08/05/2021)   Humiliation, Afraid, Rape, and Kick questionnaire    Fear of Current or Ex-Partner: No    Emotionally Abused: No    Physically Abused: No    Sexually Abused: No   Social History   Tobacco Use  Smoking Status Former   Packs/day: 0.50   Years: 49.00   Additional pack years: 0.00   Total pack years: 24.50   Types: Cigarettes   Quit date: 12/13/2018   Years since quitting: 4.2   Passive exposure: Past  Smokeless Tobacco Never   Social History   Substance and Sexual Activity  Alcohol Use Not Currently   Comment: On occasion    Family History:  Family History  Problem Relation Age of Onset   Diabetes Mother    Cancer Father        Pancreatic   Diabetes Maternal Aunt    Diabetes Maternal Grandmother    Dementia Maternal Grandfather    Heart disease Maternal Grandfather     Past medical history, surgical history, medications, allergies, family history and social history reviewed with patient today and changes made to appropriate areas of the chart.   Review of Systems  Constitutional: Negative.   HENT: Negative.    Eyes: Negative.   Respiratory:  Positive for cough, shortness of breath and wheezing. Negative for hemoptysis and sputum production.   Cardiovascular:  Positive for leg swelling. Negative for chest pain, palpitations, orthopnea, claudication and PND.  Gastrointestinal: Negative.   Genitourinary: Negative.   Musculoskeletal: Negative.   Skin: Negative.   Neurological: Negative.   Endo/Heme/Allergies:  Negative for environmental allergies and polydipsia.  Bruises/bleeds easily.  Psychiatric/Behavioral:  Negative.      All other ROS negative except what is listed above and in the HPI.      Objective:    BP 117/70   Pulse 71   Temp 98.4 F (36.9 C) (Oral)   Wt 136 lb 9.6 oz (62 kg)   SpO2 97%   BMI 23.45 kg/m   Wt Readings from Last 3 Encounters:  03/26/23 136 lb 9.6 oz (62 kg)  01/21/23 135 lb 14.4 oz (61.6 kg)  06/23/22 110 lb (49.9 kg)     Physical Exam Vitals and nursing note reviewed.  Constitutional:      General: She is not in acute distress.    Appearance: Normal appearance. She is normal weight. She is not ill-appearing, toxic-appearing or diaphoretic.  HENT:     Head: Normocephalic and atraumatic.     Right Ear: Tympanic membrane, ear canal and external ear normal. There is no impacted cerumen.     Left Ear: Tympanic membrane, ear canal and external ear normal. There is no impacted cerumen.     Nose: Nose normal. No congestion or rhinorrhea.     Mouth/Throat:     Mouth: Mucous membranes are moist.     Pharynx: Oropharynx is clear. No oropharyngeal exudate or posterior oropharyngeal erythema.  Eyes:     General: No scleral icterus.       Right eye: No discharge.        Left eye: No discharge.     Extraocular Movements: Extraocular movements intact.     Conjunctiva/sclera: Conjunctivae normal.     Pupils: Pupils are equal, round, and reactive to light.  Neck:     Vascular: No carotid bruit.  Cardiovascular:     Rate and Rhythm: Normal rate and regular rhythm.     Pulses: Normal pulses.     Heart sounds: No murmur heard.    No friction rub. No gallop.  Pulmonary:     Effort: Pulmonary effort is normal. No respiratory distress.     Breath sounds: No stridor. No wheezing, rhonchi or rales.     Comments: Decreased breath sounds bilaterally Chest:     Chest wall: No tenderness.  Abdominal:     General: Abdomen is flat. Bowel sounds are normal. There is no distension.     Palpations: Abdomen is soft. There is  no mass.     Tenderness: There is no abdominal tenderness. There is no right CVA tenderness, left CVA tenderness, guarding or rebound.     Hernia: No hernia is present.  Genitourinary:    Comments: Breast and pelvic exams deferred with shared decision making Musculoskeletal:        General: No swelling, tenderness, deformity or signs of injury.     Cervical back: Normal range of motion and neck supple. No rigidity. No muscular tenderness.     Right lower leg: Edema (trace) present.     Left lower leg: Edema (trace) present.  Lymphadenopathy:     Cervical: No cervical adenopathy.  Skin:    General: Skin is warm and dry.     Capillary Refill: Capillary refill takes less than 2 seconds.     Coloration: Skin is not jaundiced or pale.     Findings: No bruising, erythema, lesion or rash.     Comments: Hemosiderin staining bilateral lower legs  Neurological:     General: No focal deficit present.     Mental Status: She is alert and oriented to person, place, and time. Mental status  is at baseline.     Cranial Nerves: No cranial nerve deficit.     Sensory: No sensory deficit.     Motor: No weakness.     Coordination: Coordination normal.     Gait: Gait normal.     Deep Tendon Reflexes: Reflexes normal.  Psychiatric:        Mood and Affect: Mood normal.        Behavior: Behavior normal.        Thought Content: Thought content normal.        Judgment: Judgment normal.        03/26/2023    2:03 PM 08/05/2021    5:46 PM 07/02/2020    2:43 PM 05/17/2020   11:28 AM 09/19/2018   11:31 AM  6CIT Screen  What Year? 0 points 0 points 0 points 0 points 0 points  What month? 0 points 0 points 0 points 0 points 0 points  What time? 0 points 0 points 0 points 0 points 0 points  Count back from 20 0 points 0 points 0 points 0 points 0 points  Months in reverse 0 points 0 points 0 points 0 points 0 points  Repeat phrase 0 points 0 points 0 points 2 points 2 points  Total Score 0 points 0 points 0  points 2 points 2 points    Results for orders placed or performed in visit on 01/21/23  Microscopic Examination   Urine  Result Value Ref Range   WBC, UA >30W 0 - 5 /hpf   RBC, Urine 3-10 (A) 0 - 2 /hpf   Epithelial Cells (non renal) 0-10 0 - 10 /hpf   Mucus, UA Present (A) Not Estab.   Bacteria, UA None seen None seen/Few  Urine Culture   Specimen: Urine   UR  Result Value Ref Range   Urine Culture, Routine Final report (A)    Organism ID, Bacteria Proteus mirabilis (A)    Antimicrobial Susceptibility Comment   Urinalysis, Routine w reflex microscopic  Result Value Ref Range   Specific Gravity, UA 1.025 1.005 - 1.030   pH, UA 5.5 5.0 - 7.5   Color, UA Yellow Yellow   Appearance Ur Turbid (A) Clear   Leukocytes,UA 1+ (A) Negative   Protein,UA 1+ (A) Negative/Trace   Glucose, UA Negative Negative   Ketones, UA Negative Negative   RBC, UA 2+ (A) Negative   Bilirubin, UA Negative Negative   Urobilinogen, Ur 0.2 0.2 - 1.0 mg/dL   Nitrite, UA Negative Negative   Microscopic Examination See below:       Assessment & Plan:   Problem List Items Addressed This Visit       Respiratory   Chronic obstructive pulmonary disease (HCC)    Not under good control. Will get her switched from anoro to trelegy and recheck in about a month. Call with any concerns.       Relevant Medications   albuterol (VENTOLIN HFA) 108 (90 Base) MCG/ACT inhaler   Fluticasone-Umeclidin-Vilant (TRELEGY ELLIPTA) 100-62.5-25 MCG/ACT AEPB   Other Relevant Orders   CBC with Differential/Platelet   Comprehensive metabolic panel     Digestive   Hep C w/o coma, chronic (HCC)    S/p treatment. Rechecking labs today. Await results. Treat as needed.       Relevant Orders   CBC with Differential/Platelet   Comprehensive metabolic panel   HCV RNA quant     Endocrine   IFG (impaired fasting glucose)    Stable with  A1c of 6.2. Continue current regimen. Continue to monitor. Call with any concerns.        Relevant Orders   Microalbumin, Urine Waived   Bayer DCA Hb A1c Waived   CBC with Differential/Platelet   Comprehensive metabolic panel   Lipid Panel w/o Chol/HDL Ratio   Urinalysis, Routine w reflex microscopic   TSH     Genitourinary   Chronic kidney disease, stage 3a (HCC)    Rechecking labs today. Await results. Treat as needed.         Hematopoietic and Hemostatic   Thrombocytopenia (HCC)    Rechecking labs today. Await results. Treat as needed.       Relevant Orders   CBC with Differential/Platelet   Comprehensive metabolic panel     Other   Depression, recurrent (HCC)    Under good control on current regimen. Continue current regimen. Continue to monitor. Call with any concerns. Refills given. Labs drawn today.        Relevant Orders   CBC with Differential/Platelet   Comprehensive metabolic panel   Methadone dependence (HCC)    Stable. Continue to monitor. Call with any concerns.       Relevant Orders   CBC with Differential/Platelet   Comprehensive metabolic panel   Nicotine dependence    Concern for PAD- will get her into vascular. Current smoker. Needs lung cancer screening- referral placed today.      Relevant Orders   CBC with Differential/Platelet   Comprehensive metabolic panel   Urinalysis, Routine w reflex microscopic   Ambulatory Referral Lung Cancer Screening [facility]   Ambulatory referral to Vascular Surgery   RESOLVED: Protein-calorie malnutrition, severe (HCC)    Resolved. Weight back up. Continue to monitor.      Relevant Orders   CBC with Differential/Platelet   Comprehensive metabolic panel   TSH   Other Visit Diagnoses     Encounter for Medicare annual wellness exam    -  Primary   Preventative care discussed today as below.   Routine general medical examination at a health care facility       Vaccines up to date. Screening labs checked today. Mammo and DEXA ordered. Cologuard up to date. Lung cancer screening  ordered. Continue diet and exercise.   Screening for osteoporosis       Relevant Orders   DG Bone Density   Encounter for screening mammogram for malignant neoplasm of breast       Relevant Orders   MM 3D SCREENING MAMMOGRAM BILATERAL BREAST W/IMPLANT   Peripheral edema       Concern for PAD- will get her into vascular. Current smoker. Needs lung cancer screening- referral placed today.   Relevant Orders   Ambulatory referral to Vascular Surgery   Change of skin color       Concern for PAD- will get her into vascular. Current smoker. Needs lung cancer screening- referral placed today.   Relevant Orders   Ambulatory referral to Vascular Surgery   Pyuria       Await culture. Treat as needed.   Relevant Orders   Urine Culture        Preventative Services:  Health Risk Assessment and Personalized Prevention Plan: Done today Bone Mass Measurements: Scheduled today Breast Cancer Screening: Scheduled today CVD Screening: Done today Cervical Cancer Screening: N/A Colon Cancer Screening: Due in October Depression Screening: Done today Diabetes Screening: Done today Glaucoma Screening: See your eye doctor Hepatitis B vaccine: up to date Hepatitis C screening:  up to date HIV Screening:up to date Flu Vaccine: get in the fall Lung cancer Screening: ordered today- they will call you Obesity Screening: Done today Pneumonia Vaccines (2): up to date STI Screening: N/A  Follow up plan: Return in about 4 weeks (around 04/23/2023) for follow up breathing.   LABORATORY TESTING:  - Pap smear: not applicable  IMMUNIZATIONS:   - Tdap: Tetanus vaccination status reviewed: last tetanus booster within 10 years. - Influenza: Postponed to flu season - Pneumovax: Up to date - Prevnar: Up to date - Zostavax vaccine: Refused  SCREENING: -Mammogram: Ordered today  - Colonoscopy: Up to date  - Bone Density: Ordered today   PATIENT COUNSELING:   Advised to take 1 mg of folate supplement per  day if capable of pregnancy.   Sexuality: Discussed sexually transmitted diseases, partner selection, use of condoms, avoidance of unintended pregnancy  and contraceptive alternatives.   Advised to avoid cigarette smoking.  I discussed with the patient that most people either abstain from alcohol or drink within safe limits (<=14/week and <=4 drinks/occasion for males, <=7/weeks and <= 3 drinks/occasion for females) and that the risk for alcohol disorders and other health effects rises proportionally with the number of drinks per week and how often a drinker exceeds daily limits.  Discussed cessation/primary prevention of drug use and availability of treatment for abuse.   Diet: Encouraged to adjust caloric intake to maintain  or achieve ideal body weight, to reduce intake of dietary saturated fat and total fat, to limit sodium intake by avoiding high sodium foods and not adding table salt, and to maintain adequate dietary potassium and calcium preferably from fresh fruits, vegetables, and low-fat dairy products.    stressed the importance of regular exercise  Injury prevention: Discussed safety belts, safety helmets, smoke detector, smoking near bedding or upholstery.   Dental health: Discussed importance of regular tooth brushing, flossing, and dental visits.    NEXT PREVENTATIVE PHYSICAL DUE IN 1 YEAR. Return in about 4 weeks (around 04/23/2023) for follow up breathing.

## 2023-03-26 NOTE — Assessment & Plan Note (Signed)
S/p treatment. Rechecking labs today. Await results. Treat as needed.

## 2023-03-26 NOTE — Assessment & Plan Note (Signed)
Rechecking labs today. Await results. Treat as needed.  °

## 2023-03-26 NOTE — Assessment & Plan Note (Signed)
Concern for PAD- will get her into vascular. Current smoker. Needs lung cancer screening- referral placed today.

## 2023-03-26 NOTE — Assessment & Plan Note (Signed)
Not under good control. Will get her switched from anoro to trelegy and recheck in about a month. Call with any concerns.

## 2023-03-26 NOTE — Patient Instructions (Addendum)
Preventative Services:  Health Risk Assessment and Personalized Prevention Plan: Done today Bone Mass Measurements: Scheduled today Breast Cancer Screening: Scheduled today CVD Screening: Done today Cervical Cancer Screening: N/A Colon Cancer Screening: Due in October Depression Screening: Done today Diabetes Screening: Done today Glaucoma Screening: See your eye doctor Hepatitis B vaccine: up to date Hepatitis C screening: up to date HIV Screening:up to date Flu Vaccine: get in the fall Lung cancer Screening: ordered today- they will call you Obesity Screening: Done today Pneumonia Vaccines (2): up to date STI Screening: N/A  You are scheduled MONDAY JUNE 17TH AT 2:00 PM for mammogram and bone density: [facility] [address] [place],  [place]  [address] Phone: [phone]   Vascular Doctor Thursday June 13, 1:30PM [address] [place] [address]

## 2023-03-26 NOTE — Assessment & Plan Note (Signed)
Resolved. Weight back up. Continue to monitor.

## 2023-03-26 NOTE — Assessment & Plan Note (Signed)
Stable with A1c of 6.2. Continue current regimen. Continue to monitor. Call with any concerns.

## 2023-03-26 NOTE — Assessment & Plan Note (Signed)
Stable. Continue to monitor. Call with any concerns.  ?

## 2023-03-28 LAB — COMPREHENSIVE METABOLIC PANEL
ALT: 12 IU/L (ref 0–32)
AST: 17 IU/L (ref 0–40)
Albumin/Globulin Ratio: 1.8 (ref 1.2–2.2)
Albumin: 4.4 g/dL (ref 3.8–4.8)
Alkaline Phosphatase: 88 IU/L (ref 44–121)
BUN/Creatinine Ratio: 14 (ref 12–28)
BUN: 15 mg/dL (ref 8–27)
Bilirubin Total: <0.2 mg/dL (ref 0.0–1.2)
CO2: 21 mmol/L (ref 20–29)
Calcium: 9.2 mg/dL (ref 8.7–10.3)
Chloride: 101 mmol/L (ref 96–106)
Creatinine, Ser: 1.08 mg/dL — ABNORMAL HIGH (ref 0.57–1.00)
Globulin, Total: 2.4 g/dL (ref 1.5–4.5)
Glucose: 86 mg/dL (ref 70–99)
Potassium: 4.4 mmol/L (ref 3.5–5.2)
Sodium: 138 mmol/L (ref 134–144)
Total Protein: 6.8 g/dL (ref 6.0–8.5)
eGFR: 55 mL/min/{1.73_m2} — ABNORMAL LOW (ref >59)

## 2023-03-28 LAB — CBC WITH DIFFERENTIAL/PLATELET
Basophils Absolute: 0.1 10*3/uL (ref 0.0–0.2)
Basos: 1 %
EOS (ABSOLUTE): 0.1 10*3/uL (ref 0.0–0.4)
Eos: 2 %
Hematocrit: 41.2 % (ref 34.0–46.6)
Hemoglobin: 13.5 g/dL (ref 11.1–15.9)
Immature Grans (Abs): 0 10*3/uL (ref 0.0–0.1)
Immature Granulocytes: 0 %
Lymphocytes Absolute: 2.5 10*3/uL (ref 0.7–3.1)
Lymphs: 28 %
MCH: 30.1 pg (ref 26.6–33.0)
MCHC: 32.8 g/dL (ref 31.5–35.7)
MCV: 92 fL (ref 79–97)
Monocytes Absolute: 0.7 10*3/uL (ref 0.1–0.9)
Monocytes: 8 %
Neutrophils Absolute: 5.6 10*3/uL (ref 1.4–7.0)
Neutrophils: 61 %
Platelets: 174 10*3/uL (ref 150–450)
RBC: 4.48 x10E6/uL (ref 3.77–5.28)
RDW: 13 % (ref 11.7–15.4)
WBC: 9.1 10*3/uL (ref 3.4–10.8)

## 2023-03-28 LAB — LIPID PANEL W/O CHOL/HDL RATIO
Cholesterol, Total: 146 mg/dL (ref 100–199)
HDL: 56 mg/dL (ref >39)
LDL Chol Calc (NIH): 69 mg/dL (ref 0–99)
Triglycerides: 120 mg/dL (ref 0–149)
VLDL Cholesterol Cal: 21 mg/dL (ref 5–40)

## 2023-03-28 LAB — HCV RNA QUANT: Hepatitis C Quantitation: NOT DETECTED IU/mL

## 2023-03-28 LAB — TSH: TSH: 1.98 u[IU]/mL (ref 0.450–4.500)

## 2023-03-29 ENCOUNTER — Encounter: Payer: Self-pay | Admitting: Family Medicine

## 2023-03-29 LAB — URINE CULTURE

## 2023-04-02 ENCOUNTER — Telehealth: Payer: Self-pay | Admitting: Family Medicine

## 2023-04-02 NOTE — Telephone Encounter (Signed)
Copied from CRM [id number]. Topic: Medicare AWV >> Apr 02, 2023 10:50 AM Payton Doughty wrote: Reason for CRM: Called patient to schedule Medicare Annual Wellness Visit (AWV). Left message for patient to call back and schedule Medicare Annual Wellness Visit (AWV).  Last date of AWV: 08/18/2021   Please schedule an appointment at any time with Kennedy Bucker, LPN  .  If any questions, please contact m.  Thank you ,  Verlee Rossetti; Care Guide [facility] Direct Dial: [phone]

## 2023-04-09 ENCOUNTER — Encounter: Payer: Self-pay | Admitting: Physician Assistant

## 2023-04-09 ENCOUNTER — Ambulatory Visit
Admission: RE | Admit: 2023-04-09 | Discharge: 2023-04-09 | Disposition: A | Discharge status: Home / Self Care | Payer: 59 | Source: Ambulatory Visit | Attending: Physician Assistant | Admitting: Physician Assistant

## 2023-04-09 ENCOUNTER — Other Ambulatory Visit: Admission: RE | Admit: 2023-04-09 | Payer: 59 | Source: Ambulatory Visit | Admitting: *Deleted

## 2023-04-09 ENCOUNTER — Ambulatory Visit (INDEPENDENT_AMBULATORY_CARE_PROVIDER_SITE_OTHER): Payer: 59 | Admitting: Physician Assistant

## 2023-04-09 VITALS — BP 117/76 | HR 114 | Temp 97.9°F | Ht 64.02 in | Wt 131.6 lb

## 2023-04-09 DIAGNOSIS — J441 Chronic obstructive pulmonary disease with (acute) exacerbation: Secondary | ICD-10-CM

## 2023-04-09 DIAGNOSIS — R0689 Other abnormalities of breathing: Secondary | ICD-10-CM

## 2023-04-09 DIAGNOSIS — J189 Pneumonia, unspecified organism: Secondary | ICD-10-CM

## 2023-04-09 MED ORDER — AMOXICILLIN-POT CLAVULANATE 875-125 MG PO TABS
1.0000 | ORAL_TABLET | Freq: Two times a day (BID) | ORAL | 0 refills | Status: DC
Start: 2023-04-09 — End: 2023-04-16

## 2023-04-09 NOTE — Progress Notes (Unsigned)
Acute Office Visit   Patient: Kim Graham   DOB: [birth year]   72 y.o. Female  MRN: [id number] Visit Date: 04/09/2023  Today's healthcare provider: Oswaldo Conroy Raahim Shartzer, PA-C  Introduced myself to the patient as a Secondary school teacher and provided education on APPs in clinical practice.    Chief Complaint  Patient presents with   Fatigue    Chest tight and back pain. Patient states that she feels bad, really bad   Subjective    HPI HPI     Fatigue    Additional comments: Chest tight and back pain. Patient states that she feels bad, really bad      Last edited by Sherolyn Buba, CMA on 04/09/2023  9:59 AM.        She states she is concerned for lack of energy and chest congestion  Onset: sudden with progressively worsening symptoms  Duration: about a week  She denies severe difficulty breathing outside of her normal She denies productive coughing She reports she feels feverish, and has left sided thoracic back pain  She denies chest pain, left arm pain, jaw pain, neck pain at this time  Interventions: She has tried Mucinex   She reports she is using her inhalers but has stopped using her Trelegy  She states one of her friends told her Trelegy made him feel bad so she stopped taking it thinking it may be causing her symptoms  She is not sure if the Trelegy was the cause of her symptoms  She reports some improvement in her symptoms when she uses her rescue inhaler She is using this about once per day over the past few days.     Medications: Outpatient Medications Prior to Visit  Medication Sig   albuterol (VENTOLIN HFA) 108 (90 Base) MCG/ACT inhaler Inhale 2 puffs into the lungs every 6 (six) hours as needed for wheezing or shortness of breath.   ARIPiprazole (ABILIFY) 30 MG tablet Take 1 tablet (30 mg total) by mouth daily.   ascorbic acid (VITAMIN C) 500 MG tablet Take by mouth.   Fluticasone-Umeclidin-Vilant (TRELEGY ELLIPTA) 100-62.5-25 MCG/ACT AEPB Inhale 1 puff into  the lungs daily.   methadone (DOLOPHINE) 10 MG tablet Take 100 mg by mouth daily.   Multiple Vitamin (MULTI-VITAMIN) tablet Take 1 tablet by mouth daily.   No facility-administered medications prior to visit.    Review of Systems  Constitutional:  Positive for chills, fatigue and fever.  HENT:  Positive for congestion. Negative for ear pain, sinus pressure and sinus pain.   Respiratory:  Positive for shortness of breath. Negative for cough, chest tightness and wheezing.   Cardiovascular:  Negative for chest pain, palpitations and leg swelling.  Gastrointestinal:  Negative for diarrhea, nausea and vomiting.  Musculoskeletal:  Positive for back pain.  Neurological:  Negative for dizziness and headaches.    {Labs  Heme  Chem  Endocrine  Serology  Results Review (optional):23779}   Objective    BP 117/76   Pulse (!) 114   Temp 97.9 F (36.6 C) (Oral)   Ht 5' 4.02" (1.626 m)   Wt 131 lb 9.6 oz (59.7 kg)   SpO2 91%   BMI 22.58 kg/m  {Show previous vital signs (optional):23777}  Physical Exam Vitals reviewed.  Constitutional:      Appearance: Normal appearance.  HENT:     Head: Normocephalic and atraumatic.  Cardiovascular:     Rate and Rhythm: Normal rate and regular rhythm.  Pulses: Normal pulses.          Radial pulses are 2+ on the right side and 2+ on the left side.     Heart sounds: Normal heart sounds. No murmur heard.    No friction rub. No gallop.  Pulmonary:     Effort: Prolonged expiration present. No respiratory distress or retractions.     Breath sounds: Decreased air movement present. Examination of the right-middle field reveals decreased breath sounds. Examination of the right-lower field reveals decreased breath sounds. Examination of the left-lower field reveals decreased breath sounds. Decreased breath sounds present. No wheezing, rhonchi or rales.  Musculoskeletal:     Right lower leg: No edema.     Left lower leg: No edema.  Neurological:      General: No focal deficit present.     Mental Status: She is alert.  Psychiatric:        Mood and Affect: Mood normal.        Behavior: Behavior normal.        Thought Content: Thought content normal.        Judgment: Judgment normal.       No results found for any visits on 04/09/23.  Assessment & Plan      No follow-ups on file.

## 2023-04-09 NOTE — Patient Instructions (Addendum)
   Please restart your Trelegy inhaler as directed. We will keep you updated on the results of you chest xray once they are available.   Please go here for your  xray   [address] [place] [address] [place], [place] [address]

## 2023-04-09 NOTE — Progress Notes (Signed)
Your chest xray was concerning for pneumonia. I have sent in a script for an antibiotic for you to start taking along with resuming your Trelegy. If you have continued shortness of breath or worsening symptoms please let us know or go to the ED if you cannot get in touch with the office. Please return to the office in about 1 weeks so we can recheck your lungs to make sure the infection is clearing up.

## 2023-04-12 NOTE — Assessment & Plan Note (Signed)
Chronic, historic condition, does not appear well controlled Recommend that she resume using her Trelegy inhaler in addition to her rescue inhaler - if she has side effects she should schedule a follow up to discuss alternatives Given her current symptoms- will get CXR  CXR found potential pneumonia in left lower lobe- will treat as indicated below  Recommend follow up in about one week for monitoring

## 2023-04-16 ENCOUNTER — Ambulatory Visit (INDEPENDENT_AMBULATORY_CARE_PROVIDER_SITE_OTHER): Payer: 59 | Admitting: Physician Assistant

## 2023-04-16 ENCOUNTER — Encounter: Payer: Self-pay | Admitting: Physician Assistant

## 2023-04-16 ENCOUNTER — Ambulatory Visit: Payer: Self-pay | Admitting: *Deleted

## 2023-04-16 VITALS — BP 127/71 | HR 94 | Temp 98.7°F | Wt 132.0 lb

## 2023-04-16 DIAGNOSIS — N390 Urinary tract infection, site not specified: Secondary | ICD-10-CM

## 2023-04-16 DIAGNOSIS — Z09 Encounter for follow-up examination after completed treatment for conditions other than malignant neoplasm: Secondary | ICD-10-CM

## 2023-04-16 DIAGNOSIS — N760 Acute vaginitis: Secondary | ICD-10-CM | POA: Diagnosis not present

## 2023-04-16 DIAGNOSIS — R300 Dysuria: Secondary | ICD-10-CM | POA: Diagnosis not present

## 2023-04-16 DIAGNOSIS — R319 Hematuria, unspecified: Secondary | ICD-10-CM

## 2023-04-16 DIAGNOSIS — B9689 Other specified bacterial agents as the cause of diseases classified elsewhere: Secondary | ICD-10-CM

## 2023-04-16 LAB — WET PREP FOR TRICH, YEAST, CLUE
Clue Cell Exam: POSITIVE — AB
Trichomonas Exam: NEGATIVE
Yeast Exam: NEGATIVE

## 2023-04-16 LAB — URINALYSIS, ROUTINE W REFLEX MICROSCOPIC
Bilirubin, UA: NEGATIVE
Glucose, UA: NEGATIVE
Nitrite, UA: POSITIVE — AB
Specific Gravity, UA: 1.02 (ref 1.005–1.030)
Urobilinogen, Ur: 1 mg/dL (ref 0.2–1.0)
pH, UA: 5.5 (ref 5.0–7.5)

## 2023-04-16 LAB — MICROSCOPIC EXAMINATION

## 2023-04-16 MED ORDER — METRONIDAZOLE 500 MG PO TABS
500.0000 mg | ORAL_TABLET | Freq: Two times a day (BID) | ORAL | 0 refills | Status: DC
Start: 2023-04-16 — End: 2023-05-05

## 2023-04-16 MED ORDER — AMOXICILLIN-POT CLAVULANATE 875-125 MG PO TABS
1.0000 | ORAL_TABLET | Freq: Two times a day (BID) | ORAL | 0 refills | Status: DC
Start: 2023-04-16 — End: 2023-05-05

## 2023-04-16 MED ORDER — NITROFURANTOIN MONOHYD MACRO 100 MG PO CAPS
100.0000 mg | ORAL_CAPSULE | Freq: Two times a day (BID) | ORAL | 0 refills | Status: DC
Start: 2023-04-16 — End: 2023-04-16

## 2023-04-16 MED ORDER — PHENAZOPYRIDINE HCL 100 MG PO TABS
100.0000 mg | ORAL_TABLET | Freq: Three times a day (TID) | ORAL | 0 refills | Status: DC | PRN
Start: 2023-04-16 — End: 2023-05-05

## 2023-04-16 NOTE — Patient Instructions (Addendum)
Please pick up the Augmentin, Flagyl, and Pyridium

## 2023-04-16 NOTE — Telephone Encounter (Signed)
  Chief Complaint: possible UTI Symptoms: burning with urination, frequency, cloudy urine Frequency: For the last week.  Been getting worse Pertinent Negatives: Patient denies blood in urine or flank or abd pain Disposition: [] ED /[] Urgent Care (no appt availability in office) / [x] Appointment(In office/virtual)/ []  [facility]/ [] Home Care/ [] Refused Recommended Disposition /[] [place] [facility]/ []  Follow-up with PCP Additional Notes: Appt. Made today with Erin Mecum, PA-C at 2:20.

## 2023-04-16 NOTE — Progress Notes (Unsigned)
Acute Office Visit   Patient: Kim Graham   DOB: 1951/07/20   72 y.o. Female  MRN: [id number] Visit Date: 04/16/2023  Today's healthcare provider: Oswaldo Conroy Kitzia Camus, PA-C  Introduced myself to the patient as a Secondary school teacher and provided education on APPs in clinical practice.    Chief Complaint  Patient presents with   Urinary Tract Infection    Pt states she has been having burning and pain with urination for the last week. States she has also been having urinary frequency.    Subjective    HPI HPI     Urinary Tract Infection    Additional comments: Pt states she has been having burning and pain with urination for the last week. States she has also been having urinary frequency.       Last edited by Pablo Ledger, CMA on 04/16/2023  2:34 PM.      Concern for UTI  Onset: sudden  Duration: started last week  Associated symptoms: dysuria, malaise, urinary hesitancy  She denies flank pain, fevers, chills, hematuria Interventions: nothing   Patient did not get augmentin for pneumonia as clinic unable to reach her    Breathing and Pneumonia follow up  She reports her breathing his fine today  She is using her Trelegy again and reports no side effects      Medications: Outpatient Medications Prior to Visit  Medication Sig   albuterol (VENTOLIN HFA) 108 (90 Base) MCG/ACT inhaler Inhale 2 puffs into the lungs every 6 (six) hours as needed for wheezing or shortness of breath.   ARIPiprazole (ABILIFY) 30 MG tablet Take 1 tablet (30 mg total) by mouth daily.   ascorbic acid (VITAMIN C) 500 MG tablet Take by mouth.   Fluticasone-Umeclidin-Vilant (TRELEGY ELLIPTA) 100-62.5-25 MCG/ACT AEPB Inhale 1 puff into the lungs daily.   methadone (DOLOPHINE) 10 MG tablet Take 100 mg by mouth daily.   Multiple Vitamin (MULTI-VITAMIN) tablet Take 1 tablet by mouth daily.   [DISCONTINUED] amoxicillin-clavulanate (AUGMENTIN) 875-125 MG tablet Take 1 tablet by mouth 2 (two) times daily.    No facility-administered medications prior to visit.    Review of Systems  Constitutional:  Negative for chills and fever.  Respiratory:  Negative for cough, shortness of breath and wheezing.   Genitourinary:  Positive for dysuria, frequency and urgency. Negative for difficulty urinating, flank pain, hematuria, vaginal bleeding, vaginal discharge and vaginal pain.       Objective    BP 127/71   Pulse 94   Temp 98.7 F (37.1 C) (Oral)   Wt 132 lb (59.9 kg)   SpO2 97%   BMI 22.65 kg/m    Physical Exam Vitals reviewed.  Constitutional:      General: She is awake.     Appearance: Normal appearance. She is well-developed and well-groomed.  HENT:     Head: Normocephalic and atraumatic.  Cardiovascular:     Rate and Rhythm: Normal rate and regular rhythm.     Heart sounds: Normal heart sounds.  Pulmonary:     Effort: Pulmonary effort is normal.     Breath sounds: Normal breath sounds. No decreased air movement. No decreased breath sounds, wheezing, rhonchi or rales.  Abdominal:     General: Abdomen is flat. Bowel sounds are normal.     Palpations: Abdomen is soft.  Skin:    General: Skin is warm and dry.  Neurological:     General: No focal deficit present.  Mental Status: She is alert and oriented to person, place, and time.  Psychiatric:        Mood and Affect: Mood normal.        Behavior: Behavior normal. Behavior is cooperative.       Results for orders placed or performed in visit on 04/16/23  WET PREP FOR TRICH, YEAST, CLUE   Specimen: Vaginal Swab   Vaginal Swab  Result Value Ref Range   Trichomonas Exam Negative Negative   Yeast Exam Negative Negative   Clue Cell Exam Positive (A) Negative  Microscopic Examination   Urine  Result Value Ref Range   WBC, UA 11-30 (A) 0 - 5 /hpf   RBC, Urine 3-10 (A) 0 - 2 /hpf   Epithelial Cells (non renal) 0-10 0 - 10 /hpf   Bacteria, UA Moderate (A) None seen/Few  Urinalysis, Routine w reflex microscopic   Result Value Ref Range   Specific Gravity, UA 1.020 1.005 - 1.030   pH, UA 5.5 5.0 - 7.5   Color, UA Yellow Yellow   Appearance Ur Cloudy (A) Clear   Leukocytes,UA 3+ (A) Negative   Protein,UA 2+ (A) Negative/Trace   Glucose, UA Negative Negative   Ketones, UA Trace (A) Negative   RBC, UA 1+ (A) Negative   Bilirubin, UA Negative Negative   Urobilinogen, Ur 1.0 0.2 - 1.0 mg/dL   Nitrite, UA Positive (A) Negative   Microscopic Examination See below:     Assessment & Plan      No follow-ups on file.      Problem List Items Addressed This Visit   None Visit Diagnoses     Urinary tract infection with hematuria, site unspecified    -  Primary Acute, new concern Patient presents today with dysuria, increased urinary frequency, urinary hesitancy since last week Her urinalysis is consistent with a UTI Will send in Augmentin to assist with symptoms along with Pyridium for discomfort Will send sample off for urine culture for ID and susceptibility testing-results to dictate further management Reviewed importance of finishing entire antibiotic course unless allergic reaction or if instructed to discontinue Reviewed importance of staying well-hydrated and avoiding holding urine for prolonged periods of time Follow-up as needed for persistent or progressing symptoms   Relevant Medications   metroNIDAZOLE (FLAGYL) 500 MG tablet   phenazopyridine (PYRIDIUM) 100 MG tablet   amoxicillin-clavulanate (AUGMENTIN) 875-125 MG tablet   Burning with urination       Relevant Medications   phenazopyridine (PYRIDIUM) 100 MG tablet   Other Relevant Orders   Urinalysis, Routine w reflex microscopic (Completed)   WET PREP FOR TRICH, YEAST, CLUE (Completed)   Urine Culture   BV (bacterial vaginosis)     Acute, new concern Patient's wet prep was positive for clue cells Will provide metronidazole for management Follow-up as needed for persistent or progressing symptoms   Relevant Medications    metroNIDAZOLE (FLAGYL) 500 MG tablet   Follow-up exam     Patient was seen 04/09/2023 for potential COPD exacerbation. Her chest x-ray at that time demonstrated left lower lobe opacity that was consistent with pneumonia. Attempts to reach her to inform her of antibiotic prescription failed so patient did not start her Augmentin She reports that her breathing has improved as of today and she has been using her Trelegy as directed Given her chest x-ray findings and the fact that she did not start her Augmentin I will send in a prescription for Augmentin as this should provide coverage for  both pneumonia and UTI Follow-up as needed for persistent or progressing symptoms        No follow-ups on file.   I, Ailani Governale E Cassadi Purdie, PA-C, have reviewed all documentation for this visit. The documentation on 04/19/23 for the exam, diagnosis, procedures, and orders are all accurate and complete.   Jacquelin Hawking, MHS, PA-C [facility]

## 2023-04-16 NOTE — Telephone Encounter (Signed)
Message from Cory Munch sent at 04/16/2023  9:08 AM EDT  Summary: Question UTI   Frequent urination, cloudy urine, pain when urinating (few days now)          Call History   Type Contact Phone/Fax User  04/16/2023 09:05 AM EDT Phone (Incoming) Metzger, Bobbie Vird[place] ([place]) [phone] Cory Munch   Reason for Disposition  Age > 50 years  Answer Assessment - Initial Assessment Questions 1. SEVERITY: "How bad is the pain?"  (e.g., Scale 1-10; mild, moderate, or severe)   - MILD (1-3): complains slightly about urination hurting   - MODERATE (4-7): interferes with normal activities     - SEVERE (8-10): excruciating, unwilling or unable to urinate because of the pain      Burning with urination, cloudy urine, frequency.   No flank pain.    2. FREQUENCY: "How many times have you had painful urination today?"      Yes 3. PATTERN: "Is pain present every time you urinate or just sometimes?"      Yes 4. ONSET: "When did the painful urination start?"      Sunday but last week it got worse 5. FEVER: "Do you have a fever?" If Yes, ask: "What is your temperature, how was it measured, and when did it start?"     Not asked 6. PAST UTI: "Have you had a urine infection before?" If Yes, ask: "When was the last time?" and "What happened that time?"      I've had one or two 7. CAUSE: "What do you think is causing the painful urination?"  (e.g., UTI, scratch, Herpes sore)     UTI 8. OTHER SYMPTOMS: "Do you have any other symptoms?" (e.g., blood in urine, flank pain, genital sores, urgency, vaginal discharge)     See above 9. PREGNANCY: "Is there any chance you are pregnant?" "When was your last menstrual period?"     N/A  Protocols used: Urination Pain - Female-A-AH

## 2023-04-19 ENCOUNTER — Ambulatory Visit: Payer: Self-pay

## 2023-04-19 NOTE — Telephone Encounter (Signed)
Patient states she is taking the antibiotic prescribed- but symptoms are not improved- burning in urine may have improved- but respiratory symptoms are not better. Patient never got the medication for pneumonia. Patient never picked it up: Amoxicillin-Pot Clavulanate 875-125 MG 1 tablet Oral 2 times daily. Patient advised she needs to take the medication prescribed.  Patient states she is taking metronidazole and macrodantin- I advised patient that she should be taking Augmentin for her respiratory diagnosis- I called the pharmacy and they have it ready for her to pick up. Patient has been notified that the medication is ready and she needs to start it- call office if she does not improve after starting.

## 2023-04-19 NOTE — Telephone Encounter (Signed)
She has 10 days of antibiotics- I'm not sure what she's asking for

## 2023-04-19 NOTE — Telephone Encounter (Signed)
Called patient to gather more information in regards to request of more medication. Unable to leave message for patient as her voicemail box is full and unable to leave messages.   OK for [facility] to gather more information if patient calls back.

## 2023-04-19 NOTE — Telephone Encounter (Signed)
Chief Complaint: Continued frequency, dribbling urine. Asking for more antibiotic. Declines OV, "I can't get a ride." Symptoms: Above Frequency: Last week, "antibiotic not helping." Pertinent Negatives: Patient denies fever Disposition: [] ED /[] Urgent Care (no appt availability in office) / [] Appointment(In office/virtual)/ []  [facility]/ [] Home Care/ [] Refused Recommended Disposition /[] [place] [facility]/ [x]  Follow-up with PCP Additional Notes: Please advise pt. Reason for Disposition  [1] Taking antibiotic > 24 hours for UTI (urinary tract or bladder infection) AND [2] fever persists (still has fever)  Answer Assessment - Initial Assessment Questions 1. LOCATION: "Where does it hurt?"       Below breast  2. RADIATION: "Does the pain go anywhere else?" (e.g., into neck, jaw, arms, back)     No 3. ONSET: "When did the chest pain begin?" (Minutes, hours or days)      Friday 4. PATTERN: "Does the pain come and go, or has it been constant since it started?"  "Does it get worse with exertion?"      Comes and goes 5. DURATION: "How long does it last" (e.g., seconds, minutes, hours)     A second 6. SEVERITY: "How bad is the pain?"  (e.g., Scale 1-10; mild, moderate, or severe)    - MILD (1-3): doesn't interfere with normal activities     - MODERATE (4-7): interferes with normal activities or awakens from sleep    - SEVERE (8-10): excruciating pain, unable to do any normal activities       Mild 7. CARDIAC RISK FACTORS: "Do you have any history of heart problems or risk factors for heart disease?" (e.g., angina, prior heart attack; diabetes, high blood pressure, high cholesterol, smoker, or strong family history of heart disease)     No 8. PULMONARY RISK FACTORS: "Do you have any history of lung disease?"  (e.g., blood clots in lung, asthma, emphysema, birth control pills)     COPD 9. CAUSE: "What do you think is causing the chest pain?"     Pneumonia  10. OTHER  SYMPTOMS: "Do you have any other symptoms?" (e.g., dizziness, nausea, vomiting, sweating, fever, difficulty breathing, cough)       N/a 11. PREGNANCY: "Is there any chance you are pregnant?" "When was your last menstrual period?"       no  Answer Assessment - Initial Assessment Questions 1. MAIN SYMPTOM: "What is the main symptom you are concerned about?" (e.g., painful urination, urine frequency)     Frequency, dribbling urine 2. BETTER-SAME-WORSE: "Are you getting better, staying the same, or getting worse compared to how you felt at your last visit to the doctor (most recent medical visit)?"     Same 3. PAIN: "How bad is the pain?"  (e.g., Scale 1-10; mild, moderate, or severe)   - MILD (1-3): complains slightly about urination hurting   - MODERATE (4-7): interferes with normal activities     - SEVERE (8-10): excruciating, unwilling or unable to urinate because of the pain      Moderate 4. FEVER: "Do you have a fever?" If Yes, ask: "What is it, how was it measured, and when did it start?"     no 5. OTHER SYMPTOMS: "Do you have any other symptoms?" (e.g., blood in the urine, flank pain, vaginal discharge)     No 6. DIAGNOSIS: "When was the UTI diagnosed?" "By whom?" "Was it a kidney infection, bladder infection or both?"     LAST week 7. ANTIBIOTIC: "What antibiotic(s) are you taking?" "  How many times per day?"      8. ANTIBIOTIC - START DATE: "When did you start taking the antibiotic?"     Last  Protocols used: Chest Pain-A-AH, Urinary Tract Infection on Antibiotic Follow-up Call - [facility]

## 2023-04-22 ENCOUNTER — Encounter (INDEPENDENT_AMBULATORY_CARE_PROVIDER_SITE_OTHER): Payer: 59 | Admitting: Vascular Surgery

## 2023-04-22 LAB — URINE CULTURE

## 2023-04-23 NOTE — Progress Notes (Signed)
The antibiotic that we sent in at your last apt was appropriate for the bacteria that was causing your UTI. Please finish the entire course and let us know if you have further concerns.

## 2023-04-26 ENCOUNTER — Ambulatory Visit: Payer: 59 | Admitting: Family Medicine

## 2023-04-26 ENCOUNTER — Other Ambulatory Visit: Payer: 59

## 2023-04-26 ENCOUNTER — Inpatient Hospital Stay: Admission: RE | Admit: 2023-04-26 | Payer: 59 | Source: Ambulatory Visit

## 2023-04-26 ENCOUNTER — Ambulatory Visit: Payer: Self-pay

## 2023-04-26 NOTE — Telephone Encounter (Signed)
Appt. Please help with transportation.

## 2023-04-26 NOTE — Telephone Encounter (Signed)
  Chief Complaint: bilateral knee to foot swelling Symptoms: pitting edema, slight redness noted pt stated not a new sx Frequency: since started abx Pertinent Negatives: Patient denies chest pain and SOB Disposition: [] ED /[] Urgent Care (no appt availability in office) / [] Appointment(In office/virtual)/ []  [place] [facility]/ [] Home Care/ [x] Refused Recommended Disposition /[] [place] [facility]/ []  Follow-up with PCP Additional Notes: advised pt will need appt for evaluation and pt stopping abx. Pt refusing stated no transportation Reason for Disposition  [1] MODERATE leg swelling (e.g., swelling extends up to knees) AND [2] new-onset or worsening  [1] Caller has URGENT medicine question about med that PCP or specialist prescribed AND [2] triager unable to answer question  Answer Assessment - Initial Assessment Questions 1. ONSET: "When did the swelling start?" (e.g., minutes, hours, days)     Few days ago  2. LOCATION: "What part of the leg is swollen?"  "Are both legs swollen or just one leg?"     Both legs knees down tight  3. SEVERITY: "How bad is the swelling?" (e.g., localized; mild, moderate, severe)   - Localized: Small area of swelling localized to one leg.   - MILD pedal edema: Swelling limited to foot and ankle, pitting edema < 1/4 inch (6 mm) deep, rest and elevation eliminate most or all swelling.   - MODERATE edema: Swelling of lower leg to knee, pitting edema > 1/4 inch (6 mm) deep, rest and elevation only partially reduce swelling.   - SEVERE edema: Swelling extends above knee, facial or hand swelling present.      moderate 4. REDNESS: "Does the swelling look red or infected?"     mild 5. PAIN: "Is the swelling painful to touch?" If Yes, ask: "How painful is it?"   (Scale 1-10; mild, moderate or severe)     no 6. FEVER: "Do you have a fever?" If Yes, ask: "What is it, how was it measured, and when did it start?"      no 7. CAUSE: "What do you think is causing the  leg swelling?"     Abx  8. RECURRENT SYMPTOM: "Have you had leg swelling before?" If Yes, ask: "When was the last time?" "What happened that time?"     Not this bad 9. OTHER SYMPTOMS: "Do you have any other symptoms?" (e.g., chest pain, difficulty breathing)       no  Answer Assessment - Initial Assessment Questions 1. NAME of MEDICINE: "What medicine(s) are you calling about?"     Abx 2. QUESTION: "What is your question?" (e.g., double dose of medicine, side effect)     Side effect 3. PRESCRIBER: "Who prescribed the medicine?" Reason: if prescribed by specialist, call should be referred to that group.     PCP 4. SYMPTOMS: "Do you have any symptoms?" If Yes, ask: "What symptoms are you having?"  "How bad are the symptoms (e.g., mild, moderate, severe)     Moderate swelling both legs from knees down- mild redness to ankle area- pt stated began and has worsened since taking abx- pt stated she took last does this am and will not take anymore  Protocols used: Leg Swelling and Edema-A-AH, Medication Question Call-A-AH

## 2023-04-26 NOTE — Telephone Encounter (Signed)
Attempted to call patient call could not be completed mailbox was full.

## 2023-04-27 NOTE — Telephone Encounter (Signed)
Unable to reach patient call going straight to voice mail. Put CRM in fro [facility] incase the patient calls the office

## 2023-04-30 ENCOUNTER — Ambulatory Visit: Payer: 59 | Admitting: Family Medicine

## 2023-05-02 NOTE — Patient Instructions (Signed)
Cough, Adult A cough helps to clear your throat and lungs. It may be a sign of an illness or another condition. A short-term (acute) cough may last 2-3 weeks. A long-term (chronic) cough may last 8 or more weeks. Many things can cause a cough. They include: Illnesses such as: An infection in your throat or lungs. Asthma or other heart or lung problems. Gastroesophageal reflux. This is when acid comes back up from your stomach. Breathing in things that bother (irritate) your lungs. Allergies. Postnasal drip. This is when mucus runs down the back of your throat. Smoking. Some medicines. Follow these instructions at home: Medicines Take over-the-counter and prescription medicines only as told by your doctor. Talk with your doctor before you take cough medicine (cough suppressants). Eating and drinking Do not drink alcohol. Do not drink caffeine. Drink enough fluid to keep your pee (urine) pale yellow. Lifestyle Stay away from cigarette smoke. Do not smoke or use any products that contain nicotine or tobacco. If you need help quitting, ask your doctor. Stay away from things that make you cough. These may include perfume, candles, cleaning products, or campfire smoke. General instructions  Watch for any changes to your cough. Tell your doctor about them. Always cover your mouth when you cough. If the air is dry in your home, use a cool mist vaporizer or humidifier. If your cough is worse at night, try using extra pillows to raise your head up higher while you sleep. Rest as needed. Contact a doctor if: You have new symptoms. Your symptoms get worse. You cough up pus. You have a fever that does not go away. Your cough does not get better after 2-3 weeks. Cough medicine does not help, and you are not sleeping well. You have pain that gets worse or is not helped with medicine. You are losing weight and do not know why. You have night sweats. Get help right away if: You cough up  blood. You have trouble breathing. Your heart is beating very fast. These symptoms may be an emergency. Get help right away. Call 911. Do not wait to see if the symptoms will go away. Do not drive yourself to the hospital. This information is not intended to replace advice given to you by your health care provider. Make sure you discuss any questions you have with your health care provider. Document Revised: 06/26/2022 Document Reviewed: 06/26/2022 Elsevier Patient Education  2024 [employer].  

## 2023-05-05 ENCOUNTER — Ambulatory Visit (INDEPENDENT_AMBULATORY_CARE_PROVIDER_SITE_OTHER): Payer: 59 | Admitting: Family Medicine

## 2023-05-05 ENCOUNTER — Encounter: Payer: Self-pay | Admitting: Family Medicine

## 2023-05-05 ENCOUNTER — Ambulatory Visit
Admission: RE | Admit: 2023-05-05 | Discharge: 2023-05-05 | Disposition: A | Discharge status: Home / Self Care | Payer: 59 | Source: Ambulatory Visit | Attending: Family Medicine | Admitting: Family Medicine

## 2023-05-05 ENCOUNTER — Ambulatory Visit
Admission: RE | Admit: 2023-05-05 | Discharge: 2023-05-05 | Disposition: A | Discharge status: Home / Self Care | Payer: 59 | Attending: Family Medicine | Admitting: Family Medicine

## 2023-05-05 VITALS — BP 156/62 | HR 87 | Temp 99.1°F | Ht 64.02 in | Wt 138.0 lb

## 2023-05-05 DIAGNOSIS — J449 Chronic obstructive pulmonary disease, unspecified: Secondary | ICD-10-CM | POA: Insufficient documentation

## 2023-05-05 DIAGNOSIS — Z8701 Personal history of pneumonia (recurrent): Secondary | ICD-10-CM | POA: Insufficient documentation

## 2023-05-05 MED ORDER — BREZTRI AEROSPHERE 160-9-4.8 MCG/ACT IN AERO: 2.0000 | INHALATION_SPRAY | Freq: Two times a day (BID) | RESPIRATORY_TRACT | 11 refills | Status: DC

## 2023-05-05 MED ORDER — ALBUTEROL SULFATE (2.5 MG/3ML) 0.083% IN NEBU: 2.5000 mg | INHALATION_SOLUTION | Freq: Four times a day (QID) | RESPIRATORY_TRACT | 1 refills | Status: DC | PRN

## 2023-05-05 NOTE — Assessment & Plan Note (Signed)
Has been taking anoro instead of trelegy- likely causing her nocturnal symptoms. Will change to breztri and recheck 6 weeks. Call with any concerns. Decreased breath sounds

## 2023-05-05 NOTE — Progress Notes (Signed)
BP (!) 156/62   Pulse 87   Temp 99.1 F (37.3 C) (Oral)   Ht 5' 4.02" (1.626 m)   Wt 138 lb (62.6 kg)   SpO2 94%   BMI 23.68 kg/m    Subjective:    Patient ID: Kim Graham, female    DOB: 1951/08/05, 72 y.o.   MRN: [id number]  HPI: Kim Graham is a 72 y.o. female  Chief Complaint  Patient presents with   Shortness of Breath    Having bad SOB in the middle of the night waking her at 2am.    SHORTNESS OF BREATH- finished her antibiotics about a week or so, stopped her trelegy and has been taking anoro. Waking up at 2-3AM with SOB for the past 2 days Duration: 2 days Onset: sudden Description of breathing discomfort: SOB Severity: severe Episode duration: about an hour Frequency: nightly for 2 days Related to exertion: no Cough: no Chest tightness: yes Wheezing: no Fevers: no Chest pain: no Palpitations: no  Nausea: no Diaphoresis: no Deconditioning: no Status: worse   Relevant past medical, surgical, family and social history reviewed and updated as indicated. Interim medical history since our last visit reviewed. Allergies and medications reviewed and updated.  Review of Systems  Constitutional: Negative.   HENT: Negative.    Respiratory:  Positive for shortness of breath. Negative for apnea, cough, choking, chest tightness, wheezing and stridor.   Cardiovascular: Negative.   Gastrointestinal: Negative.   Musculoskeletal: Negative.   Psychiatric/Behavioral: Negative.      Per HPI unless specifically indicated above     Objective:    BP (!) 156/62   Pulse 87   Temp 99.1 F (37.3 C) (Oral)   Ht 5' 4.02" (1.626 m)   Wt 138 lb (62.6 kg)   SpO2 94%   BMI 23.68 kg/m   Wt Readings from Last 3 Encounters:  05/05/23 138 lb (62.6 kg)  04/16/23 132 lb (59.9 kg)  04/09/23 131 lb 9.6 oz (59.7 kg)    Physical Exam Vitals and nursing note reviewed.  Constitutional:      General: She is not in acute distress.    Appearance: Normal appearance. She is  not ill-appearing, toxic-appearing or diaphoretic.  HENT:     Head: Normocephalic and atraumatic.     Right Ear: External ear normal.     Left Ear: External ear normal.     Nose: Nose normal.     Mouth/Throat:     Mouth: Mucous membranes are moist.     Pharynx: Oropharynx is clear.  Eyes:     General: No scleral icterus.       Right eye: No discharge.        Left eye: No discharge.     Extraocular Movements: Extraocular movements intact.     Conjunctiva/sclera: Conjunctivae normal.     Pupils: Pupils are equal, round, and reactive to light.  Cardiovascular:     Rate and Rhythm: Normal rate and regular rhythm.     Pulses: Normal pulses.     Heart sounds: Normal heart sounds. No murmur heard.    No friction rub. No gallop.  Pulmonary:     Effort: Pulmonary effort is normal. No respiratory distress.     Breath sounds: No stridor. Examination of the right-lower field reveals decreased breath sounds. Examination of the left-lower field reveals decreased breath sounds. Decreased breath sounds present. No wheezing, rhonchi or rales.  Chest:     Chest wall: No tenderness.  Musculoskeletal:        General: Normal range of motion.     Cervical back: Normal range of motion and neck supple.  Skin:    General: Skin is warm and dry.     Capillary Refill: Capillary refill takes less than 2 seconds.     Coloration: Skin is not jaundiced or pale.     Findings: No bruising, erythema, lesion or rash.  Neurological:     General: No focal deficit present.     Mental Status: She is alert and oriented to person, place, and time. Mental status is at baseline.  Psychiatric:        Mood and Affect: Mood normal.        Behavior: Behavior normal.        Thought Content: Thought content normal.        Judgment: Judgment normal.     Results for orders placed or performed in visit on 04/16/23  WET PREP FOR TRICH, YEAST, CLUE   Specimen: Vaginal Swab   Vaginal Swab  Result Value Ref Range    Trichomonas Exam Negative Negative   Yeast Exam Negative Negative   Clue Cell Exam Positive (A) Negative  Urine Culture   Specimen: Urine   UR  Result Value Ref Range   Urine Culture, Routine Final report (A)    Organism ID, Bacteria Comment (A)    Antimicrobial Susceptibility Comment   Microscopic Examination   Urine  Result Value Ref Range   WBC, UA 11-30 (A) 0 - 5 /hpf   RBC, Urine 3-10 (A) 0 - 2 /hpf   Epithelial Cells (non renal) 0-10 0 - 10 /hpf   Bacteria, UA Moderate (A) None seen/Few  Urinalysis, Routine w reflex microscopic  Result Value Ref Range   Specific Gravity, UA 1.020 1.005 - 1.030   pH, UA 5.5 5.0 - 7.5   Color, UA Yellow Yellow   Appearance Ur Cloudy (A) Clear   Leukocytes,UA 3+ (A) Negative   Protein,UA 2+ (A) Negative/Trace   Glucose, UA Negative Negative   Ketones, UA Trace (A) Negative   RBC, UA 1+ (A) Negative   Bilirubin, UA Negative Negative   Urobilinogen, Ur 1.0 0.2 - 1.0 mg/dL   Nitrite, UA Positive (A) Negative   Microscopic Examination See below:       Assessment & Plan:   Problem List Items Addressed This Visit       Respiratory   Chronic obstructive pulmonary disease (HCC) - Primary    Has been taking anoro instead of trelegy- likely causing her nocturnal symptoms. Will change to breztri and recheck 6 weeks. Call with any concerns. Decreased breath sounds      Relevant Medications   Budeson-Glycopyrrol-Formoterol (BREZTRI AEROSPHERE) 160-9-4.8 MCG/ACT AERO   albuterol (PROVENTIL) (2.5 MG/3ML) 0.083% nebulizer solution   Other Relevant Orders   DG Chest 2 View   Other Visit Diagnoses     History of pneumonia       Rechecking CXR. Await results.   Relevant Orders   DG Chest 2 View        Follow up plan: Return in about 6 weeks (around 06/16/2023).

## 2023-05-07 NOTE — Telephone Encounter (Signed)
Copied from CRM [id number]. Topic: General - Other >> May 07, 2023  3:35 PM Carrielelia G wrote: Please call Ms. Westcott regarding, her chest xray results, she would like to know when they will be available.

## 2023-05-10 ENCOUNTER — Telehealth: Payer: Self-pay | Admitting: Family Medicine

## 2023-05-10 NOTE — Telephone Encounter (Signed)
Attempted to reach pt. No answer and full Vm will attempt to contact pt again.

## 2023-05-10 NOTE — Telephone Encounter (Unsigned)
Copied from CRM [id number]. Topic: General - Other >> May 10, 2023  9:53 AM Epimenio Foot F wrote: Reason for CRM: Pt is calling in returning a call from the office. Please follow up with pt.

## 2023-05-10 NOTE — Telephone Encounter (Unsigned)
Copied from CRM [id number]. Topic: General - Other >> May 10, 2023  9:20 AM Kim Graham wrote: Patient called to get the results of her chest xray from 6/26. Please advise.

## 2023-05-14 ENCOUNTER — Other Ambulatory Visit: Payer: Self-pay | Admitting: Family Medicine

## 2023-05-14 DIAGNOSIS — R0602 Shortness of breath: Secondary | ICD-10-CM

## 2023-05-14 DIAGNOSIS — R9389 Abnormal findings on diagnostic imaging of other specified body structures: Secondary | ICD-10-CM

## 2023-06-10 ENCOUNTER — Ambulatory Visit (INDEPENDENT_AMBULATORY_CARE_PROVIDER_SITE_OTHER): Payer: 59 | Admitting: Vascular Surgery

## 2023-06-10 ENCOUNTER — Encounter (INDEPENDENT_AMBULATORY_CARE_PROVIDER_SITE_OTHER): Payer: Self-pay | Admitting: Vascular Surgery

## 2023-06-10 VITALS — BP 141/75 | HR 97 | Resp 16 | Ht 64.0 in | Wt 135.6 lb

## 2023-06-10 DIAGNOSIS — I872 Venous insufficiency (chronic) (peripheral): Secondary | ICD-10-CM | POA: Diagnosis not present

## 2023-06-10 DIAGNOSIS — M79604 Pain in right leg: Secondary | ICD-10-CM

## 2023-06-10 DIAGNOSIS — M79605 Pain in left leg: Secondary | ICD-10-CM

## 2023-06-10 DIAGNOSIS — J441 Chronic obstructive pulmonary disease with (acute) exacerbation: Secondary | ICD-10-CM

## 2023-06-10 DIAGNOSIS — N1831 Chronic kidney disease, stage 3a: Secondary | ICD-10-CM | POA: Diagnosis not present

## 2023-06-10 NOTE — Progress Notes (Signed)
MRN : [id number]  Kim Graham is a 72 y.o. (1951/06/08) female who presents with chief complaint of check circulation.  History of Present Illness:   Patient is seen for evaluation of leg pain and associated with some swelling (mostly over the dorsum of the feet and in the ankle area). The patient first noticed the skin discoloration and swelling remotely. The swelling and discoloration associated with her pain has been progressive. The pain and swelling worsens with prolonged dependency and improves with elevation. The pain is unrelated to activity.  The patient notes that in the morning the legs are significantly improved but they steadily worsened throughout the course of the day. The patient also notes a steady worsening of the discoloration in the ankle and shin area.   The patient denies claudication symptoms.  The patient denies symptoms consistent with rest pain.  The patient denies and extensive history of DJD and LS spine disease.  The patient has no had any past angiography, interventions or vascular surgery.  Elevation makes the leg symptoms better, dependency makes them much worse. There is no history of ulcerations. The patient denies any recent changes in medications.  The patient has not been wearing graduated compression.  The patient denies a history of DVT or PE. There is no prior history of phlebitis. There is no history of primary lymphedema.  No history of malignancies. No history of trauma or groin or pelvic surgery. There is no history of radiation treatment to the groin or pelvis  There are no recent neurological changes noted. The patient denies recent episodes of angina or shortness of breath  Current Meds  Medication Sig   albuterol (PROVENTIL) (2.5 MG/3ML) 0.083% nebulizer solution Take 3 mLs (2.5 mg total) by nebulization every 6 (six) hours as needed for wheezing or shortness of  breath.   albuterol (VENTOLIN HFA) 108 (90 Base) MCG/ACT inhaler Inhale 2 puffs into the lungs every 6 (six) hours as needed for wheezing or shortness of breath.   ARIPiprazole (ABILIFY) 30 MG tablet Take 1 tablet (30 mg total) by mouth daily.   ascorbic acid (VITAMIN C) 500 MG tablet Take by mouth.   Budeson-Glycopyrrol-Formoterol (BREZTRI AEROSPHERE) 160-9-4.8 MCG/ACT AERO Inhale 2 puffs into the lungs 2 (two) times daily.   methadone (DOLOPHINE) 10 MG tablet Take 100 mg by mouth daily.   Multiple Vitamin (MULTI-VITAMIN) tablet Take 1 tablet by mouth daily.    Past Medical History:  Diagnosis Date   Acute cholecystitis 12/09/2021   Bile leak    COPD (chronic obstructive pulmonary disease) (HCC)    Hepatitis    History of kidney stones    Hypertension    Sepsis secondary to UTI [facility])     Past Surgical History:  Procedure Laterality Date   CYSTOSCOPY W/ URETERAL STENT PLACEMENT Right 12/09/2021   Procedure: CYSTOSCOPY WITH RETROGRADE PYELOGRAM/URETERAL STENT PLACEMENT;  Surgeon: Riki Altes, MD;  Location: [facility] ORS;  Service: Urology;  Laterality: Right;   CYSTOSCOPY/URETEROSCOPY/HOLMIUM LASER/STENT PLACEMENT Bilateral 02/11/2022   Procedure: CYSTOSCOPY/URETEROSCOPY/HOLMIUM LASER/STENT PLACEMENT & RIGHT URETERAL STENT EXCHANGE;  Surgeon: Riki Altes, MD;  Location: [facility] ORS;  Service: Urology;  Laterality: Bilateral;   CYSTOSCOPY/URETEROSCOPY/HOLMIUM LASER/STENT PLACEMENT Bilateral 05/19/2022   Procedure: CYSTOSCOPY/URETEROSCOPY/HOLMIUM LASER/STENT PLACEMENT;  Surgeon: Riki Altes, MD;  Location: [facility] ORS;  Service: Urology;  Laterality: Bilateral;   ENDOSCOPIC RETROGRADE CHOLANGIOPANCREATOGRAPHY (ERCP) WITH PROPOFOL N/A 12/11/2021   Procedure: ENDOSCOPIC RETROGRADE CHOLANGIOPANCREATOGRAPHY (ERCP) WITH PROPOFOL;  Surgeon: [name], MD;  Location: [facility] ENDOSCOPY;  Service: Endoscopy;  Laterality: N/A;   ERCP N/A 03/10/2022   Procedure: ENDOSCOPIC RETROGRADE  CHOLANGIOPANCREATOGRAPHY (ERCP);  Surgeon: [name], MD;  Location: [facility] ENDOSCOPY;  Service: Endoscopy;  Laterality: N/A;  Stent removal   ERCP N/A 06/23/2022   Procedure: ENDOSCOPIC RETROGRADE CHOLANGIOPANCREATOGRAPHY (ERCP);  Surgeon: [name], MD;  Location: [facility] ENDOSCOPY;  Service: Endoscopy;  Laterality: N/A;  stent removal    Social History Social History   Tobacco Use   Smoking status: Former    Current packs/day: 0.00    Average packs/day: 0.5 packs/day for 49.0 years (24.5 ttl pk-yrs)    Types: Cigarettes    Start date: 12/13/1969    Quit date: 12/13/2018    Years since quitting: 4.4    Passive exposure: Past   Smokeless tobacco: Never  Vaping Use   Vaping status: Never Used  Substance Use Topics   Alcohol use: Not Currently    Comment: On occasion   Drug use: No    Family History Family History  Problem Relation Age of Onset   Diabetes Mother    Cancer Father        Pancreatic   Diabetes Maternal Aunt    Diabetes Maternal Grandmother    Dementia Maternal Grandfather    Heart disease Maternal Grandfather     No Known Allergies   REVIEW OF SYSTEMS (Negative unless checked)  Constitutional: [] Weight loss  [] Fever  [] Chills Cardiac: [] Chest pain   [] Chest pressure   [] Palpitations   [] Shortness of breath when laying flat   [] Shortness of breath with exertion. Vascular:  [x] Pain in legs with walking   [] Pain in legs at rest  [] History of DVT   [] Phlebitis   [] Swelling in legs   [] Varicose veins   [] Non-healing ulcers Pulmonary:   [] Uses home oxygen   [] Productive cough   [] Hemoptysis   [] Wheeze  [x] COPD   [] Asthma Neurologic:  [] Dizziness   [] Seizures   [] History of stroke   [] History of TIA  [] Aphasia   [] Vissual changes   [] Weakness or numbness in arm   [] Weakness or numbness in leg Musculoskeletal:   [] Joint swelling   [x] Joint pain   [] Low back pain Hematologic:  [] Easy bruising  [] Easy bleeding   [] Hypercoagulable state   [] Anemic Gastrointestinal:   [] Diarrhea   [] Vomiting  [] Gastroesophageal reflux/heartburn   [] Difficulty swallowing. Genitourinary:  [x] Chronic kidney disease   [] Difficult urination  [] Frequent urination   [] Blood in urine Skin:  [] Rashes   [] Ulcers  Psychological:  [] History of anxiety   []  History of major depression.  Physical Examination  Vitals:   06/10/23 1325  BP: (!) 141/75  Pulse: 97  Resp: 16  Weight: 135 lb 9.6 oz (61.5 kg)  Height: 5\' 4"  (1.626 m)   Body mass index is 23.28 kg/m. Gen: WD/WN, NAD Head: [place]/AT, No temporalis wasting.  Ear/Nose/Throat: Hearing grossly intact, nares w/o erythema or drainage Eyes: PER, EOMI, sclera nonicteric.  Neck: Supple, no masses.  No bruit or JVD.  Pulmonary:  Good air movement, no audible wheezing, no use of accessory muscles.  Cardiac: RRR, normal S1, S2, no Murmurs.  Vascular:   no open wounds;  scattered varicosities present bilaterally.  Moderate venous stasis changes to the legs bilaterally.  2+ soft pitting edema. CEAP C4sEpAsPr Vessel Right Left  Radial Palpable Palpable  PT  Palpable  Palpable  DP Palpable Palpable  Gastrointestinal: soft, non-distended. No guarding/no peritoneal signs.  Musculoskeletal: M/S 5/5 throughout.  No visible deformity.  Neurologic: CN 2-12 intact. Pain and light touch intact in extremities.  Symmetrical.  Speech is fluent. Motor exam as listed above. Psychiatric: Judgment intact, Mood & affect appropriate for pt's clinical situation. Dermatologic: Severe venous rashes no ulcers noted.  No changes consistent with cellulitis.   CBC Lab Results  Component Value Date   WBC 9.1 03/26/2023   HGB 13.5 03/26/2023   HCT 41.2 03/26/2023   MCV 92 03/26/2023   PLT 174 03/26/2023    BMET    Component Value Date/Time   NA 138 03/26/2023 1413   K 4.4 03/26/2023 1413   CL 101 03/26/2023 1413   CO2 21 03/26/2023 1413   GLUCOSE 86 03/26/2023 1413   GLUCOSE 114 (H) 05/21/2022 0432   BUN 15 03/26/2023 1413   CREATININE 1.08 (H)  03/26/2023 1413   CALCIUM 9.2 03/26/2023 1413   GFRNONAA 50 (L) 05/21/2022 0432   GFRAA 103 07/02/2020 1421   CrCl cannot be calculated (Patient's most recent lab result is older than the maximum 21 days allowed.).  COAG Lab Results  Component Value Date   INR 1.2 02/07/2022    Radiology No results found.   Assessment/Plan 1. Chronic venous insufficiency Recommend:  No surgery or intervention at this point in time.  I have reviewed my discussion with the patient regarding venous insufficiency and why it causes symptoms. I have discussed with the patient the chronic skin changes that accompany venous insufficiency and the long term sequela such as ulceration. Patient will contnue wearing graduated compression stockings on a daily basis, as this has provided excellent control of his edema. The patient will put the stockings on first thing in the morning and removing them in the evening. The patient is reminded not to sleep in the stockings.  In addition, behavioral modification including elevation during the day will be initiated. Exercise is strongly encouraged.  Previous duplex ultrasound of the lower extremities shows normal deep system, no significant superficial reflux was identified.  Given the patient's good control and lack of any problems regarding the venous insufficiency and lymphedema a lymph pump in not need at this time.    The patient will follow up with me PRN should anything change.  The patient voices agreement with this plan.  2. Pain in both lower extremities Recommend:  No surgery or intervention at this point in time.  I have reviewed my discussion with the patient regarding venous insufficiency and why it causes symptoms. I have discussed with the patient the chronic skin changes that accompany venous insufficiency and the long term sequela such as ulceration. Patient will contnue wearing graduated compression stockings on a daily basis, as this has  provided excellent control of his edema. The patient will put the stockings on first thing in the morning and removing them in the evening. The patient is reminded not to sleep in the stockings.  In addition, behavioral modification including elevation during the day will be initiated. Exercise is strongly encouraged.  Previous duplex ultrasound of the lower extremities shows normal deep system, no significant superficial reflux was identified.  Given the patient's good control and lack of any  problems regarding the venous insufficiency and lymphedema a lymph pump in not need at this time.    The patient will follow up with me PRN should anything change.  The patient voices agreement with this plan.  3. Chronic obstructive pulmonary disease with acute exacerbation (HCC) Continue pulmonary medications and aerosols as already ordered, these medications have been reviewed and there are no changes at this time.   4. Chronic kidney disease, stage 3a (HCC) The patient has advanced renal disease.  However, at the present time the patient is not yet on dialysis.  Avoid nephrotoxic medications and dehydration.  Further plans per primary service    Levora Dredge, MD  06/10/2023 1:30 PM

## 2023-06-13 ENCOUNTER — Encounter (INDEPENDENT_AMBULATORY_CARE_PROVIDER_SITE_OTHER): Payer: Self-pay | Admitting: Vascular Surgery

## 2023-06-13 DIAGNOSIS — M79606 Pain in leg, unspecified: Secondary | ICD-10-CM | POA: Insufficient documentation

## 2023-06-13 DIAGNOSIS — I872 Venous insufficiency (chronic) (peripheral): Secondary | ICD-10-CM | POA: Insufficient documentation

## 2023-06-28 ENCOUNTER — Ambulatory Visit: Payer: 59 | Admitting: Family Medicine

## 2023-07-08 ENCOUNTER — Ambulatory Visit (INDEPENDENT_AMBULATORY_CARE_PROVIDER_SITE_OTHER): Payer: 59

## 2023-07-08 ENCOUNTER — Encounter: Payer: Self-pay | Admitting: Physician Assistant

## 2023-07-08 VITALS — BP 99/61 | HR 94 | Temp 98.3°F | Wt 139.8 lb

## 2023-07-08 DIAGNOSIS — Z23 Encounter for immunization: Secondary | ICD-10-CM | POA: Diagnosis not present

## 2023-07-13 NOTE — Progress Notes (Signed)
Patient was here for nurse visit.

## 2023-09-14 ENCOUNTER — Ambulatory Visit
Admission: RE | Admit: 2023-09-14 | Discharge: 2023-09-14 | Disposition: A | Discharge status: Home / Self Care | Payer: 59 | Source: Ambulatory Visit | Attending: Family Medicine

## 2023-09-14 ENCOUNTER — Ambulatory Visit
Admission: RE | Admit: 2023-09-14 | Discharge: 2023-09-14 | Disposition: A | Discharge status: Home / Self Care | Payer: 59 | Attending: Family Medicine | Admitting: Family Medicine

## 2023-09-14 ENCOUNTER — Telehealth: Payer: Self-pay | Admitting: Family Medicine

## 2023-09-14 ENCOUNTER — Ambulatory Visit (INDEPENDENT_AMBULATORY_CARE_PROVIDER_SITE_OTHER): Payer: 59 | Admitting: Family Medicine

## 2023-09-14 VITALS — BP 103/64 | HR 94 | Temp 98.0°F | Ht 62.21 in | Wt 136.4 lb

## 2023-09-14 DIAGNOSIS — J441 Chronic obstructive pulmonary disease with (acute) exacerbation: Secondary | ICD-10-CM

## 2023-09-14 DIAGNOSIS — R110 Nausea: Secondary | ICD-10-CM | POA: Diagnosis not present

## 2023-09-14 DIAGNOSIS — J069 Acute upper respiratory infection, unspecified: Secondary | ICD-10-CM | POA: Insufficient documentation

## 2023-09-14 LAB — VERITOR FLU A/B WAIVED
Influenza A: NEGATIVE
Influenza B: NEGATIVE

## 2023-09-14 MED ORDER — DOXYCYCLINE HYCLATE 100 MG PO TABS: 100.0000 mg | ORAL_TABLET | Freq: Two times a day (BID) | ORAL | 0 refills | Status: AC

## 2023-09-14 MED ORDER — AMOXICILLIN-POT CLAVULANATE 875-125 MG PO TABS: 1.0000 | ORAL_TABLET | Freq: Two times a day (BID) | ORAL | 0 refills | Status: DC

## 2023-09-14 MED ORDER — PREDNISONE 10 MG PO TABS: ORAL_TABLET | ORAL | 0 refills | Status: DC

## 2023-09-14 MED ORDER — ONDANSETRON HCL 4 MG PO TABS: 4.0000 mg | ORAL_TABLET | Freq: Three times a day (TID) | ORAL | 0 refills | Status: DC | PRN

## 2023-09-14 NOTE — Patient Instructions (Addendum)
Get plenty of fluids and rest Continue to use nebulizer as needed

## 2023-09-14 NOTE — Assessment & Plan Note (Addendum)
Acute, ongoing. Flu negative, awaiting COVID. STAT chest xray ordered. Will treat with Doxycycline BID for 7 days and 6 day prednisone taper. Awaiting chest xray results. Recommend increased fluids, hydration, and rest.

## 2023-09-14 NOTE — Progress Notes (Signed)
BP 103/64   Pulse 94   Temp 98 F (36.7 C) (Oral)   Ht 5' 2.21" (1.58 m)   Wt 136 lb 6.4 oz (61.9 kg)   SpO2 (!) 89%   BMI 24.78 kg/m    Subjective:    Patient ID: Kim Graham, female    DOB: January 20, 1951, 72 y.o.   MRN: [id number]  HPI: Kim Graham is a 72 y.o. female  Chief Complaint  Patient presents with   URI    UPPER RESPIRATORY TRACT INFECTION Symptoms started 3 days ago. Had some nausea this morning and yesterday. She has had to use her nebulizer twice daily.   Worst symptom:wheezing Fever: yes 99 F Cough: yes non productive Shortness of breath: no Wheezing: yes Chest pain: no Chest tightness: no Chest congestion: yes Nasal congestion: no Runny nose: yes Post nasal drip: no Sneezing: no Sore throat: no Swollen glands: no Sinus pressure: no Headache: no Face pain: no Toothache: no Ear pain: no  Ear pressure: no  Eyes red/itching:no Eye drainage/crusting: no  Vomiting: no Rash: no Fatigue: no Sick contacts: yes  Strep contacts: no  Context: stable Recurrent sinusitis: no Relief with OTC cold/cough medications: yes a little  Treatments attempted: cold/sinus and mucinex    Relevant past medical, surgical, family and social history reviewed and updated as indicated. Interim medical history since our last visit reviewed. Allergies and medications reviewed and updated.  Review of Systems  Constitutional:  Positive for fever. Negative for chills and fatigue.  HENT:  Positive for congestion and rhinorrhea. Negative for ear pain, postnasal drip, sinus pressure, sinus pain, sneezing and sore throat.   Eyes:  Negative for discharge, redness and itching.  Respiratory:  Positive for cough and wheezing. Negative for chest tightness and shortness of breath.   Cardiovascular:  Negative for chest pain.  Gastrointestinal:  Positive for nausea. Negative for vomiting.  Skin:  Negative for rash.  Neurological:  Negative for headaches.    Per HPI unless  specifically indicated above     Objective:    BP 103/64   Pulse 94   Temp 98 F (36.7 C) (Oral)   Ht 5' 2.21" (1.58 m)   Wt 136 lb 6.4 oz (61.9 kg)   SpO2 (!) 89%   BMI 24.78 kg/m   Wt Readings from Last 3 Encounters:  09/14/23 136 lb 6.4 oz (61.9 kg)  07/08/23 139 lb 12.8 oz (63.4 kg)  06/10/23 135 lb 9.6 oz (61.5 kg)    Physical Exam Vitals and nursing note reviewed.  Constitutional:      General: She is not in acute distress.    Appearance: Normal appearance. She is ill-appearing. She is not toxic-appearing or diaphoretic.  HENT:     Head: Normocephalic and atraumatic.     Right Ear: Tympanic membrane, ear canal and external ear normal. There is no impacted cerumen. Tympanic membrane is not erythematous.     Left Ear: Tympanic membrane, ear canal and external ear normal. There is no impacted cerumen. Tympanic membrane is not erythematous.     Nose: Congestion and rhinorrhea present.     Right Turbinates: Not swollen or pale.     Left Turbinates: Not swollen or pale.     Right Sinus: No maxillary sinus tenderness or frontal sinus tenderness.     Left Sinus: No maxillary sinus tenderness or frontal sinus tenderness.     Mouth/Throat:     Mouth: Mucous membranes are moist.     Pharynx:  Oropharynx is clear. No oropharyngeal exudate, posterior oropharyngeal erythema or postnasal drip.  Eyes:     General: No scleral icterus.       Right eye: No discharge.        Left eye: No discharge.     Extraocular Movements: Extraocular movements intact.     Conjunctiva/sclera: Conjunctivae normal.     Pupils: Pupils are equal, round, and reactive to light.  Neck:     Vascular: No carotid bruit.  Cardiovascular:     Rate and Rhythm: Normal rate and regular rhythm.     Pulses: Normal pulses.          Radial pulses are 2+ on the right side and 2+ on the left side.       Posterior tibial pulses are 2+ on the right side and 2+ on the left side.     Heart sounds: Normal heart sounds. No  murmur heard.    No friction rub. No gallop.  Pulmonary:     Effort: Pulmonary effort is normal. No respiratory distress.     Breath sounds: No stridor. Wheezing and rales present. No rhonchi.  Chest:     Chest wall: No tenderness.  Musculoskeletal:        General: Normal range of motion.     Cervical back: Normal range of motion and neck supple. No rigidity. No muscular tenderness.  Lymphadenopathy:     Cervical: No cervical adenopathy.  Skin:    General: Skin is warm and dry.     Capillary Refill: Capillary refill takes less than 2 seconds.     Coloration: Skin is not jaundiced or pale.     Findings: No bruising, erythema, lesion or rash.  Neurological:     General: No focal deficit present.     Mental Status: She is alert and oriented to person, place, and time. Mental status is at baseline.     Cranial Nerves: No cranial nerve deficit.     Sensory: No sensory deficit.     Motor: No weakness.     Coordination: Coordination normal.     Gait: Gait normal.     Deep Tendon Reflexes: Reflexes normal.  Psychiatric:        Mood and Affect: Mood normal.        Behavior: Behavior normal.        Thought Content: Thought content normal.        Judgment: Judgment normal.     Results for orders placed or performed in visit on 04/16/23  WET PREP FOR TRICH, YEAST, CLUE   Specimen: Vaginal Swab   Vaginal Swab  Result Value Ref Range   Trichomonas Exam Negative Negative   Yeast Exam Negative Negative   Clue Cell Exam Positive (A) Negative  Urine Culture   Specimen: Urine   UR  Result Value Ref Range   Urine Culture, Routine Final report (A)    Organism ID, Bacteria Comment (A)    Antimicrobial Susceptibility Comment   Microscopic Examination   Urine  Result Value Ref Range   WBC, UA 11-30 (A) 0 - 5 /hpf   RBC, Urine 3-10 (A) 0 - 2 /hpf   Epithelial Cells (non renal) 0-10 0 - 10 /hpf   Bacteria, UA Moderate (A) None seen/Few  Urinalysis, Routine w reflex microscopic  Result  Value Ref Range   Specific Gravity, UA 1.020 1.005 - 1.030   pH, UA 5.5 5.0 - 7.5   Color, UA Yellow Yellow  Appearance Ur Cloudy (A) Clear   Leukocytes,UA 3+ (A) Negative   Protein,UA 2+ (A) Negative/Trace   Glucose, UA Negative Negative   Ketones, UA Trace (A) Negative   RBC, UA 1+ (A) Negative   Bilirubin, UA Negative Negative   Urobilinogen, Ur 1.0 0.2 - 1.0 mg/dL   Nitrite, UA Positive (A) Negative   Microscopic Examination See below:       Assessment & Plan:   Problem List Items Addressed This Visit     Chronic obstructive pulmonary disease (HCC) - Primary    Acute, ongoing. Flu negative, awaiting COVID. STAT chest xray ordered. Will treat with Doxycycline BID for 7 days and 6 day prednisone taper. Awaiting chest xray results. Recommend increased fluids, hydration, and rest.       Relevant Medications   predniSONE (DELTASONE) 10 MG tablet   Other Relevant Orders   Veritor Flu A/B Waived   Novel Coronavirus, NAA ([facility])   DG Chest 2 View   Other Visit Diagnoses     Nausea       Acute, stable. Zofran 4 MG given as needed.        Follow up plan: Return in about 1 week (around 09/21/2023) for Follow up lung recheck.

## 2023-09-14 NOTE — Telephone Encounter (Signed)
Unable to leave voicemail due to full mailbox; Called patient to discuss negative chest xray results.

## 2023-09-15 LAB — NOVEL CORONAVIRUS, NAA: SARS-CoV-2, NAA: NOT DETECTED

## 2023-09-15 NOTE — Telephone Encounter (Signed)
Patient returned call from office- notified negative imaging result and also negative flu results. Advised still pending COVID result.

## 2023-09-16 NOTE — Progress Notes (Signed)
Hi, can you call patient to inform her COVID is negative and chest xray is negative. This is likely a COPD exacerbation that she is having. Please advise to continue plan as discussed. Thank you!

## 2023-09-20 ENCOUNTER — Ambulatory Visit: Payer: Self-pay

## 2023-09-20 NOTE — Telephone Encounter (Signed)
Routing to provider who saw the patient.  

## 2023-09-20 NOTE — Telephone Encounter (Signed)
  Chief Complaint: severe diarrhea Symptoms: watery stools, fatigue Frequency: sat Pertinent Negatives: Patient denies abd pain Disposition: [] ED /[] Urgent Care (no appt availability in office) / [] Appointment(In office/virtual)/ []  [facility]/ [] Home Care/ [] Refused Recommended Disposition /[] [facility]/ [x]  Follow-up with PCP Additional Notes: stated on 2 abx and had to stop taking both on Saturday due to diarrhea- pt stated that she wanting to know what abx she can be on. She satated she is still sick. Reason for Disposition  SEVERE diarrhea (e.g., 7 or more times / day more than normal)  Answer Assessment - Initial Assessment Questions 1. ANTIBIOTIC: "What antibiotic are you taking?" "How many times per day?"     Amox-clalv  and doxcycycline  2. ANTIBIOTIC ONSET: "When was the antibiotic started?"     Sat am last dose 3. DIARRHEA SEVERITY: "How bad is the diarrhea?" "How many more stools have you had in the past 24 hours than normal?"    - NO DIARRHEA (SCALE 0)   - MILD (SCALE 1-3): Few loose or mushy BMs; increase of 1-3 stools over normal daily number of stools; mild increase in ostomy output.   -  MODERATE (SCALE 4-7): Increase of 4-6 stools daily over normal; moderate increase in ostomy output. * SEVERE (SCALE 8-10; OR 'WORST POSSIBLE'): Increase of 7 or more stools daily over normal; moderate increase in ostomy output; incontinence.     severe 4. ONSET: "When did the diarrhea begin?"      Sat 5. BM CONSISTENCY: "How loose or watery is the diarrhea?"      watery 6. VOMITING: "Are you also vomiting?" If Yes, ask: "How many times in the past 24 hours?"      no 7. ABDOMEN PAIN: "Are you having any abdomen pain?" If Yes, ask: "What does it feel like?" (e.g., crampy, dull, intermittent, constant)      no 8. ABDOMEN PAIN SEVERITY: If present, ask: "How bad is the pain?"  (e.g., Scale 1-10; mild, moderate, or severe)   - MILD (1-3): doesn't interfere with  normal activities, abdomen soft and not tender to touch    - MODERATE (4-7): interferes with normal activities or awakens from sleep, abdomen tender to touch    - SEVERE (8-10): excruciating pain, doubled over, unable to do any normal activities       none 9. ORAL INTAKE: If vomiting, "Have you been able to drink liquids?" "How much liquids have you had in the past 24 hours?"     A lot of water 10. HYDRATION: "Any signs of dehydration?" (e.g., dry mouth [not just dry lips], too weak to stand, dizziness, new weight loss) "When did you last urinate?"       Fatigue\1 hour ago  11. EXPOSURE: "Have you traveled to a foreign country recently?" "Have you been exposed to anyone with diarrhea?" "Could you have eaten any food that was spoiled?"       no 12. OTHER SYMPTOMS: "Do you have any other symptoms?" (e.g., fever, blood in stool)       no 13. PREGNANCY: "Is there any chance you are pregnant?" "When was your last menstrual period?"       N/a  Protocols used: Diarrhea on Antibiotics-A-AH

## 2023-09-21 ENCOUNTER — Ambulatory Visit (INDEPENDENT_AMBULATORY_CARE_PROVIDER_SITE_OTHER): Payer: 59 | Admitting: Family Medicine

## 2023-09-21 VITALS — BP 130/68 | HR 88 | Temp 98.2°F | Ht 61.81 in | Wt 137.6 lb

## 2023-09-21 DIAGNOSIS — J441 Chronic obstructive pulmonary disease with (acute) exacerbation: Secondary | ICD-10-CM | POA: Diagnosis not present

## 2023-09-21 MED ORDER — FLUTICASONE PROPIONATE 50 MCG/ACT NA SUSP: 2.0000 | Freq: Every day | NASAL | 6 refills | Status: AC

## 2023-09-21 MED ORDER — BENZONATATE 100 MG PO CAPS
100.0000 mg | ORAL_CAPSULE | Freq: Two times a day (BID) | ORAL | 0 refills | Status: DC | PRN
Start: 2023-09-21 — End: 2023-11-08

## 2023-09-21 MED ORDER — ALBUTEROL SULFATE (2.5 MG/3ML) 0.083% IN NEBU
2.5000 mg | INHALATION_SOLUTION | Freq: Once | RESPIRATORY_TRACT | Status: AC
  Administered 2023-09-21: 2.5 mg via RESPIRATORY_TRACT

## 2023-09-21 MED ORDER — PREDNISONE 10 MG PO TABS: ORAL_TABLET | ORAL | 0 refills | Status: DC

## 2023-09-21 NOTE — Progress Notes (Unsigned)
BP 130/68   Pulse 88   Temp 98.2 F (36.8 C) (Oral)   Ht 5' 1.81" (1.57 m)   Wt 137 lb 9.6 oz (62.4 kg)   SpO2 92%   BMI 25.32 kg/m    Subjective:    Patient ID: Kim Graham, female    DOB: [birth year], 72 y.o.   MRN: [id number]  HPI: Kim Graham is a 72 y.o. female  Chief Complaint  Patient presents with   Follow-up    UPPER RESPIRATORY TRACT INFECTION She was able to complete 5 days of Doxycycline, until diarrhea started and lasted for 3 days. She was able to eat and drink during this time. She feels her symptoms have gotten better a little bit. She has not used her nebulizer but has used her albuterol inhaler twice daily. She is using mucinex daily and admits this is helping. Inhaler use consist of Breztri x2 daily and albuterol as needed.   Fever: Yes 100. F at home Cough: yes non productive Shortness of breath: no Wheezing: No Chest pain: no Chest tightness: no Chest congestion: yes Nasal congestion: no Runny nose: yes Post nasal drip: no Sneezing: no Sore throat: no Swollen glands: no Sinus pressure: no Headache: no Face pain: no Toothache: no Ear pain: no  Ear pressure: no  Eyes red/itching:no Eye drainage/crusting: no  Vomiting: no Rash: no Fatigue: no Sick contacts: yes Strep contacts: no  Context: stable Recurrent sinusitis: no Relief with OTC cold/cough medications: yes a little  Treatments attempted: cold/sinus and mucinex    Relevant past medical, surgical, family and social history reviewed and updated as indicated. Interim medical history since our last visit reviewed. Allergies and medications reviewed and updated.  Review of Systems  Constitutional:  Positive for fever. Negative for chills and fatigue.  HENT:  Positive for congestion and rhinorrhea. Negative for ear pain, postnasal drip, sinus pressure, sinus pain, sneezing and sore throat.   Eyes:  Negative for discharge, redness and itching.  Respiratory:  Positive for cough and  wheezing. Negative for chest tightness and shortness of breath.   Cardiovascular:  Negative for chest pain.  Gastrointestinal:  Negative for nausea and vomiting.  Skin:  Negative for rash.  Neurological:  Negative for headaches.    Per HPI unless specifically indicated above     Objective:    BP 130/68   Pulse 88   Temp 98.2 F (36.8 C) (Oral)   Ht 5' 1.81" (1.57 m)   Wt 137 lb 9.6 oz (62.4 kg)   SpO2 92%   BMI 25.32 kg/m   Wt Readings from Last 3 Encounters:  09/21/23 137 lb 9.6 oz (62.4 kg)  09/14/23 136 lb 6.4 oz (61.9 kg)  07/08/23 139 lb 12.8 oz (63.4 kg)    Physical Exam Vitals and nursing note reviewed.  Constitutional:      General: She is not in acute distress.    Appearance: Normal appearance. She is not ill-appearing, toxic-appearing or diaphoretic.  HENT:     Head: Normocephalic and atraumatic.     Right Ear: Tympanic membrane, ear canal and external ear normal. There is no impacted cerumen. Tympanic membrane is not erythematous.     Left Ear: Tympanic membrane, ear canal and external ear normal. There is no impacted cerumen. Tympanic membrane is not erythematous.     Nose: Congestion and rhinorrhea present.     Right Turbinates: Not swollen or pale.     Left Turbinates: Not swollen or pale.  Right Sinus: No maxillary sinus tenderness or frontal sinus tenderness.     Left Sinus: No maxillary sinus tenderness or frontal sinus tenderness.     Mouth/Throat:     Mouth: Mucous membranes are moist.     Pharynx: Oropharynx is clear. No oropharyngeal exudate, posterior oropharyngeal erythema or postnasal drip.  Eyes:     General: No scleral icterus.       Right eye: No discharge.        Left eye: No discharge.     Extraocular Movements: Extraocular movements intact.     Conjunctiva/sclera: Conjunctivae normal.     Pupils: Pupils are equal, round, and reactive to light.  Neck:     Vascular: No carotid bruit.  Cardiovascular:     Rate and Rhythm: Normal rate  and regular rhythm.     Pulses: Normal pulses.          Radial pulses are 2+ on the right side and 2+ on the left side.     Heart sounds: Normal heart sounds. No murmur heard.    No friction rub. No gallop.  Pulmonary:     Effort: Pulmonary effort is normal. No respiratory distress.     Breath sounds: No stridor. Wheezing present. No rhonchi or rales.     Comments: Improvement after albuterol nebulizer; oxygen maintained at 92%.  Chest:     Chest wall: No tenderness.  Musculoskeletal:        General: Normal range of motion.     Cervical back: Normal range of motion and neck supple. No rigidity. No muscular tenderness.  Lymphadenopathy:     Cervical: No cervical adenopathy.  Skin:    General: Skin is warm and dry.     Capillary Refill: Capillary refill takes less than 2 seconds.     Coloration: Skin is not jaundiced or pale.     Findings: No bruising, erythema, lesion or rash.  Neurological:     General: No focal deficit present.     Mental Status: She is alert and oriented to person, place, and time. Mental status is at baseline.     Cranial Nerves: No cranial nerve deficit.     Sensory: No sensory deficit.     Motor: No weakness.     Coordination: Coordination normal.     Gait: Gait normal.     Deep Tendon Reflexes: Reflexes normal.  Psychiatric:        Mood and Affect: Mood normal.        Behavior: Behavior normal.        Thought Content: Thought content normal.        Judgment: Judgment normal.     Results for orders placed or performed in visit on 09/14/23  Novel Coronavirus, NAA ([facility])   Specimen: Nasopharyngeal(NP) swabs in vial transport medium  Result Value Ref Range   SARS-CoV-2, NAA Not Detected Not Detected  Veritor Flu A/B Waived  Result Value Ref Range   Influenza A Negative Negative   Influenza B Negative Negative      Assessment & Plan:   Problem List Items Addressed This Visit     COPD with acute exacerbation (HCC) - Primary    Acute, ongoing.  Will treat with another round of 6 day Prednisone taper as patient was able to complete 5 day course of Doxycycline and 6 day prednisone taper, will add tessalon and Flonase as needed for cough and congestion. Recommend monitoring oxygen levels at home, increased hydration, avoidance of triggers, daily  allergy medication, provided instruction handout for deep breathing exercises.       Relevant Medications   predniSONE (DELTASONE) 10 MG tablet   benzonatate (TESSALON) 100 MG capsule   fluticasone (FLONASE) 50 MCG/ACT nasal spray      Follow up plan: Return in about 1 month (around 10/21/2023) for Follow up COPD.

## 2023-09-21 NOTE — Patient Instructions (Signed)
Try allergy medicine over the counter daily Practice deep breathing Maintain hydration  Monitor pulse oxygen levels: Goal 88%-92%

## 2023-09-21 NOTE — Assessment & Plan Note (Signed)
Acute, ongoing. Will treat with another round of 6 day Prednisone taper as patient was able to complete 5 day course of Doxycycline and 6 day prednisone taper, will add tessalon and Flonase as needed for cough and congestion. Recommend monitoring oxygen levels at home, increased hydration, avoidance of triggers, daily allergy medication, provided instruction handout for deep breathing exercises.

## 2023-11-08 ENCOUNTER — Ambulatory Visit (INDEPENDENT_AMBULATORY_CARE_PROVIDER_SITE_OTHER): Payer: 59 | Admitting: Family Medicine

## 2023-11-08 ENCOUNTER — Encounter: Payer: Self-pay | Admitting: Family Medicine

## 2023-11-08 VITALS — BP 146/66 | HR 91 | Wt 138.4 lb

## 2023-11-08 DIAGNOSIS — L0591 Pilonidal cyst without abscess: Secondary | ICD-10-CM

## 2023-11-08 DIAGNOSIS — J441 Chronic obstructive pulmonary disease with (acute) exacerbation: Secondary | ICD-10-CM

## 2023-11-08 DIAGNOSIS — F339 Major depressive disorder, recurrent, unspecified: Secondary | ICD-10-CM

## 2023-11-08 DIAGNOSIS — R030 Elevated blood-pressure reading, without diagnosis of hypertension: Secondary | ICD-10-CM

## 2023-11-08 MED ORDER — ARIPIPRAZOLE 30 MG PO TABS: 30.0000 mg | ORAL_TABLET | Freq: Every day | ORAL | 1 refills | Status: DC

## 2023-11-08 MED ORDER — PREDNISONE 10 MG PO TABS: ORAL_TABLET | ORAL | 0 refills | Status: DC

## 2023-11-08 MED ORDER — AZITHROMYCIN 250 MG PO TABS: ORAL_TABLET | ORAL | 0 refills | Status: AC

## 2023-11-08 MED ORDER — ALBUTEROL SULFATE HFA 108 (90 BASE) MCG/ACT IN AERS: 2.0000 | INHALATION_SPRAY | Freq: Four times a day (QID) | RESPIRATORY_TRACT | 6 refills | Status: DC | PRN

## 2023-11-08 NOTE — Assessment & Plan Note (Signed)
Under good control on current regimen. Continue current regimen. Continue to monitor. Call with any concerns. Refills given.   

## 2023-11-08 NOTE — Patient Instructions (Addendum)
[facility]  11/11/23 2:30PM [address] [place],  [place]  [address] Main: [phone]

## 2023-11-08 NOTE — Progress Notes (Signed)
BP (!) 146/66   Pulse 91   Wt 138 lb 6.4 oz (62.8 kg)   SpO2 96%   BMI 25.47 kg/m    Subjective:    Patient ID: Kim Graham, female    DOB: 02-03-1951, 72 y.o.   MRN: [id number]  HPI: Kim Graham is a 72 y.o. female  Chief Complaint  Patient presents with   Cough    Patient says she has been issues since her last visit back November. Patient says she thought she was getting better, and then it flared up again. Patient says she has been experiencing tiredness and weakness for the past almost two weeks. Patient says she has tried over the counter Mucinex and it seems to be helping. Patient says she hears rattling when she starts coughing.    Congestion   UPPER RESPIRATORY TRACT INFECTION Duration: about a week Worst symptom: cough  Fever: yes Cough: yes Shortness of breath: no Wheezing: yes Chest pain: no Chest tightness: no Chest congestion: no Nasal congestion: no Runny nose: no Post nasal drip: no Sneezing: no Sore throat: no Swollen glands: no Sinus pressure: no Headache: no Face pain: no Toothache: no Ear pain: no  Ear pressure: no  Eyes red/itching:no Eye drainage/crusting: no  Vomiting: no Rash: no Fatigue: yes Sick contacts: no Strep contacts: no  Context: better Recurrent sinusitis: no Relief with OTC cold/cough medications: no  Treatments attempted: inhalers, mucinex   ANXIETY/STRESS Duration: chronic Status:stable Anxious mood: yes  Excessive worrying: no Irritability: no  Sweating: no Nausea: no Palpitations:no Hyperventilation: no Panic attacks: no Agoraphobia: no  Obscessions/compulsions: no Depressed mood: no    11/08/2023    3:21 PM 09/21/2023   11:21 AM 09/14/2023   11:40 AM 07/08/2023    1:54 PM 05/05/2023    1:39 PM  Depression screen PHQ 2/9  Decreased Interest 1 1 0 0 0  Down, Depressed, Hopeless 1 1 0 0 0  PHQ - 2 Score 2 2 0 0 0  Altered sleeping 0 0 0 0 0  Tired, decreased energy 1 0 0 1 1  Change in appetite 0  0 0 0 0  Feeling bad or failure about yourself  0 0 0 0 0  Trouble concentrating 0 0 0 0 0  Moving slowly or fidgety/restless 0 0 0 0 0  Suicidal thoughts 0 0 0 0 0  PHQ-9 Score 3 2 0 1 1  Difficult doing work/chores Not difficult at all Not difficult at all Not difficult at all Not difficult at all Not difficult at all   Anhedonia: no Weight changes: no Insomnia: no   Hypersomnia: no Fatigue/loss of energy: no Feelings of worthlessness: no Feelings of guilt: no Impaired concentration/indecisiveness: no Suicidal ideations: no  Crying spells: no Recent Stressors/Life Changes: no   Relationship problems: no   Family stress: no     Financial stress: no    Job stress: no    Recent death/loss: no  Relevant past medical, surgical, family and social history reviewed and updated as indicated. Interim medical history since our last visit reviewed. Allergies and medications reviewed and updated.  Review of Systems  Constitutional: Negative.   HENT: Negative.    Respiratory:  Positive for cough and wheezing. Negative for apnea, choking, chest tightness, shortness of breath and stridor.   Cardiovascular: Negative.   Musculoskeletal: Negative.   Psychiatric/Behavioral: Negative.      Per HPI unless specifically indicated above     Objective:  BP (!) 146/66   Pulse 91   Wt 138 lb 6.4 oz (62.8 kg)   SpO2 96%   BMI 25.47 kg/m   Wt Readings from Last 3 Encounters:  11/08/23 138 lb 6.4 oz (62.8 kg)  09/21/23 137 lb 9.6 oz (62.4 kg)  09/14/23 136 lb 6.4 oz (61.9 kg)    Physical Exam Vitals and nursing note reviewed.  Constitutional:      General: She is not in acute distress.    Appearance: Normal appearance. She is normal weight. She is not ill-appearing, toxic-appearing or diaphoretic.  HENT:     Head: Normocephalic and atraumatic.     Right Ear: Tympanic membrane, ear canal and external ear normal. There is no impacted cerumen.     Left Ear: Tympanic membrane, ear canal  and external ear normal. There is no impacted cerumen.     Nose: Nose normal. No congestion or rhinorrhea.     Mouth/Throat:     Mouth: Mucous membranes are moist.     Pharynx: Oropharynx is clear. No oropharyngeal exudate or posterior oropharyngeal erythema.  Eyes:     General: No scleral icterus.       Right eye: No discharge.        Left eye: No discharge.     Extraocular Movements: Extraocular movements intact.     Conjunctiva/sclera: Conjunctivae normal.     Pupils: Pupils are equal, round, and reactive to light.  Cardiovascular:     Rate and Rhythm: Normal rate and regular rhythm.     Pulses: Normal pulses.     Heart sounds: Normal heart sounds. No murmur heard.    No friction rub. No gallop.  Pulmonary:     Effort: Pulmonary effort is normal. No respiratory distress.     Breath sounds: No stridor. Wheezing and rhonchi present. No rales.  Chest:     Chest wall: No tenderness.  Genitourinary:    Comments: Irritation and redness, pilonidal cyst on the R buttock Musculoskeletal:        General: Normal range of motion.     Cervical back: Normal range of motion and neck supple.  Skin:    General: Skin is warm and dry.     Capillary Refill: Capillary refill takes less than 2 seconds.     Coloration: Skin is not jaundiced or pale.     Findings: No bruising, erythema, lesion or rash.  Neurological:     General: No focal deficit present.     Mental Status: She is alert and oriented to person, place, and time. Mental status is at baseline.  Psychiatric:        Mood and Affect: Mood normal.        Behavior: Behavior normal.        Thought Content: Thought content normal.        Judgment: Judgment normal.     Results for orders placed or performed in visit on 09/14/23  Veritor Flu A/B Waived   Collection Time: 09/14/23 11:30 AM  Result Value Ref Range   Influenza A Negative Negative   Influenza B Negative Negative  Novel Coronavirus, NAA ([facility])   Collection Time:  09/14/23 11:45 AM   Specimen: Nasopharyngeal(NP) swabs in vial transport medium  Result Value Ref Range   SARS-CoV-2, NAA Not Detected Not Detected      Assessment & Plan:   Problem List Items Addressed This Visit       Respiratory   COPD with acute exacerbation (HCC) -  Primary   Will treat with prednisone and azithromycin. Call with any concerns. Continue to monitor.       Relevant Medications   predniSONE (DELTASONE) 10 MG tablet   azithromycin (ZITHROMAX) 250 MG tablet   albuterol (VENTOLIN HFA) 108 (90 Base) MCG/ACT inhaler     Other   Depression, recurrent (HCC)   Under good control on current regimen. Continue current regimen. Continue to monitor. Call with any concerns. Refills given.        Other Visit Diagnoses       Pilonidal cyst       Will refer to general surgery. Await their input.   Relevant Orders   Ambulatory referral to General Surgery     Elevated BP without diagnosis of hypertension       ? due to pain. Will recheck in 2 weeks.        Follow up plan: Return in about 2 weeks (around 11/22/2023).

## 2023-11-08 NOTE — Assessment & Plan Note (Addendum)
Will treat with prednisone and azithromycin. Call with any concerns. Continue to monitor.

## 2023-11-11 ENCOUNTER — Ambulatory Visit (INDEPENDENT_AMBULATORY_CARE_PROVIDER_SITE_OTHER): Payer: 59 | Admitting: General Surgery

## 2023-11-11 ENCOUNTER — Encounter: Payer: Self-pay | Admitting: General Surgery

## 2023-11-11 VITALS — BP 150/74 | HR 98 | Temp 97.9°F | Ht 64.0 in | Wt 140.0 lb

## 2023-11-11 DIAGNOSIS — L0501 Pilonidal cyst with abscess: Secondary | ICD-10-CM

## 2023-11-11 NOTE — Patient Instructions (Addendum)
 You have been seen today for a Pilonidal Cyst. We will have you follow up here in 4 weeks to talk about surgery for this area.  Finish all of your antibiotics.  You will have some bleeding and drainage from the area. That is fine and will slow down in the next few days.  Change your gauze dressing everyday, more often is saturated with drainage.  Your packing may fall out, this is fine.  Wash with soap and warm water  once daily and keep a guaze on this area at all times while draining.  You may take 600-800mg  of Ibuprofen /Advil  every 6-8 hours for pain control. Or you may take 2 Extra Strength Tylenol  every 6-8 hours.    If we excise this area (surgery) in the future, you will need to arrange to be out of work for approximately 1-2 weeks and then have a family member change the dressing 1-2 times daily until this heals from the inside out.   Please call our office with any questions or concerns.    Pilonidal Cyst A pilonidal cyst is a fluid-filled sac. It forms beneath the skin near your tailbone, at the top of the crease of your buttocks. A pilonidal cyst that is not large or infected may not cause symptoms or problems. If the cyst becomes irritated or infected, it may fill with pus. This causes pain and swelling (pilonidal abscess). An infected cyst may need to be treated with medicine, drained, or removed. CAUSES The cause of a pilonidal cyst is not known. One cause may be a hair that grows into your skin (ingrown hair). RISK FACTORS Pilonidal cysts are more common in boys and men. Risk factors include: Having lots of hair near the crease of the buttocks. Being overweight. Having a pilonidal dimple. Wearing tight clothing. Not bathing or showering frequently. Sitting for long periods of time. SIGNS AND SYMPTOMS Signs and symptoms of a pilonidal cyst may include: Redness. Pain and tenderness. Warmth. Swelling. Pus. Fever. DIAGNOSIS Your health care provider may diagnose a  pilonidal cyst based on your symptoms and a physical exam. The health care provider may do a blood test to check for infection. If your cyst is draining pus, your health care provider may take a sample of the drainage to be tested at a laboratory. TREATMENT Surgery is the usual treatment for an infected pilonidal cyst. You may also have to take medicines before surgery. The type of surgery you have depends on the size and severity of the infected cyst. The different kinds of surgery include: Incision and drainage. This is a procedure to open and drain the cyst. Marsupialization. In this procedure, a large cyst or abscess may be opened and kept open by stitching the edges of the skin to the cyst walls. Cyst removal. This procedure involves opening the skin and removing all or part of the cyst. HOME CARE INSTRUCTIONS Follow all of your surgeon's instructions carefully if you had surgery. Take medicines only as directed by your health care provider. If you were prescribed an antibiotic medicine, finish it all even if you start to feel better. Keep the area around your pilonidal cyst clean and dry. Clean the area as directed by your health care provider. Pat the area dry with a clean towel. Do not rub it as this may cause bleeding. Remove hair from the area around the cyst as directed by your health care provider. Do not wear tight clothing or sit in one place for long periods of time.  There are many different ways to close and cover an incision, including stitches, skin glue, and adhesive strips. Follow your health care provider's instructions on: Incision care. Bandage (dressing) changes and removal. Incision closure removal. SEEK MEDICAL CARE IF:  You have drainage, redness, swelling, or pain at the site of the cyst. You have a fever.   This information is not intended to replace advice given to you by your health care provider. Make sure you discuss any questions you have with your health care  provider.   Document Released: 10/23/2000 Document Revised: 11/16/2014 Document Reviewed: 03/15/2014 Elsevier Interactive Patient Education [employer]

## 2023-11-15 ENCOUNTER — Ambulatory Visit: Payer: Self-pay

## 2023-11-15 NOTE — Telephone Encounter (Signed)
  Chief Complaint: URI FU Symptoms: still lingering bad cough with congestion Frequency: 2 weeks  Pertinent Negatives: NA Disposition: [] ED /[] Urgent Care (no appt availability in office) / [] Appointment(In office/virtual)/ []  [facility]/ [] Home Care/ [] Refused Recommended Disposition /[] [facility]/ [x]  Follow-up with PCP Additional Notes: pt requesting more abx. States she still has bad cough, has completed the Zpac but still taking the prednisone . Pt doesn't feel like the Zpac was long enough. I did advised her that the Zpac is normally for 5 days but would send message to provider for review.   Summary: Antibiotic refill request, symptomatic   Pt called requesting more azithromycin  tablets, she is currently experiencing a terrible cough, fever, and terrible rattling in my chest  She saw her PCP last week and only received five tablets last week. She needs more.  [facility] is her preferred pharmacy     Reason for Disposition  [1] Taking antibiotic > 72 hours (3 days) AND [2] sinus pain not improved  Answer Assessment - Initial Assessment Questions 1. ANTIBIOTIC: What antibiotic are you taking? How many times a day?     Zpac  2. ONSET: When was the antibiotic started?     11/08/23 3. PAIN: How bad is the sinus pain?   (Scale 1-10; mild, moderate or severe)   - MILD (1-3): doesn't interfere with normal activities    - MODERATE (4-7): interferes with normal activities (e.g., work or school) or awakens from sleep   - SEVERE (8-10): excruciating pain and patient unable to do any normal activities        Moderate  5. SYMPTOMS: Are there any other symptoms you're concerned about? If Yes, ask: When did it start?     Still having bad cough with congestion  Protocols used: Sinus Infection on Antibiotic Follow-up Call-[facility]

## 2023-11-15 NOTE — Telephone Encounter (Signed)
 Patient called, unable to leave a VM to return the call to the office to speak to the NT due to VM box being full, .   Summary: Antibiotic refill request, symptomatic   Pt called requesting more azithromycin  tablets, she is currently experiencing a terrible cough, fever, and terrible rattling in my chest  She saw her PCP last week and only received five tablets last week. She needs more.  [facility] is her preferred pharmacy

## 2023-11-16 ENCOUNTER — Other Ambulatory Visit: Payer: Self-pay | Admitting: General Surgery

## 2023-11-16 DIAGNOSIS — L0501 Pilonidal cyst with abscess: Secondary | ICD-10-CM

## 2023-11-16 MED ORDER — CEPHALEXIN 500 MG PO CAPS: 500.0000 mg | ORAL_CAPSULE | Freq: Four times a day (QID) | ORAL | 0 refills | Status: AC

## 2023-11-17 ENCOUNTER — Telehealth: Payer: Self-pay | Admitting: Family Medicine

## 2023-11-17 NOTE — Telephone Encounter (Signed)
 Copied from CRM [id number]. Topic: Medicare AWV >> Nov 17, 2023 10:36 AM Nathanel DEL wrote: Reason for CRM: Called 11/17/2023 to sched AWV - NO VOICEMAIL  Nathanel Paschal; Care Guide Ambulatory Clinical Support [place] [facility] Direct Dial: [phone]

## 2023-11-22 NOTE — Progress Notes (Signed)
 Patient ID: Kim Graham, female   DOB: 10-15-1951, 73 y.o.   MRN: [id number] CC: Gluteal Cleft Abscess  History of Present Illness Kim Graham is a 73 y.o. female with past medical history below who presents in consultation for pilonidal cyst disease.  The patient reports that she noticed that she had irritation in her gluteal cleft.  She says that over the last several days it has started to drain purulent fluid.  She went to her primary care doctor because she also had a cough and was prescribed antibiotics for this.  She says that it is continued to drain a lot and she has pain in the area.  She denies any problems with defecation including no bleeding in her stool or constipation.  She has not had any fevers or chills  Past Medical History Past Medical History:  Diagnosis Date   Acute cholecystitis 12/09/2021   Bile leak    COPD (chronic obstructive pulmonary disease) (HCC)    Hepatitis    History of kidney stones    Hypertension    Sepsis secondary to UTI [facility])        Past Surgical History:  Procedure Laterality Date   CYSTOSCOPY W/ URETERAL STENT PLACEMENT Right 12/09/2021   Procedure: CYSTOSCOPY WITH RETROGRADE PYELOGRAM/URETERAL STENT PLACEMENT;  Surgeon: Twylla Glendia BROCKS, MD;  Location: [facility] ORS;  Service: Urology;  Laterality: Right;   CYSTOSCOPY/URETEROSCOPY/HOLMIUM LASER/STENT PLACEMENT Bilateral 02/11/2022   Procedure: CYSTOSCOPY/URETEROSCOPY/HOLMIUM LASER/STENT PLACEMENT & RIGHT URETERAL STENT EXCHANGE;  Surgeon: Twylla Glendia BROCKS, MD;  Location: [facility] ORS;  Service: Urology;  Laterality: Bilateral;   CYSTOSCOPY/URETEROSCOPY/HOLMIUM LASER/STENT PLACEMENT Bilateral 05/19/2022   Procedure: CYSTOSCOPY/URETEROSCOPY/HOLMIUM LASER/STENT PLACEMENT;  Surgeon: Twylla Glendia BROCKS, MD;  Location: [facility] ORS;  Service: Urology;  Laterality: Bilateral;   ENDOSCOPIC RETROGRADE CHOLANGIOPANCREATOGRAPHY (ERCP) WITH PROPOFOL  N/A 12/11/2021   Procedure: ENDOSCOPIC RETROGRADE CHOLANGIOPANCREATOGRAPHY  (ERCP) WITH PROPOFOL ;  Surgeon: Jinny Carmine, MD;  Location: [facility] ENDOSCOPY;  Service: Endoscopy;  Laterality: N/A;   ERCP N/A 03/10/2022   Procedure: ENDOSCOPIC RETROGRADE CHOLANGIOPANCREATOGRAPHY (ERCP);  Surgeon: Jinny Carmine, MD;  Location: [facility] ENDOSCOPY;  Service: Endoscopy;  Laterality: N/A;  Stent removal   ERCP N/A 06/23/2022   Procedure: ENDOSCOPIC RETROGRADE CHOLANGIOPANCREATOGRAPHY (ERCP);  Surgeon: Jinny Carmine, MD;  Location: [facility] ENDOSCOPY;  Service: Endoscopy;  Laterality: N/A;  stent removal    Allergies  Allergen Reactions   Doxycycline  Diarrhea    Very bad diarrhea    Current Outpatient Medications  Medication Sig Dispense Refill   albuterol  (PROVENTIL ) (2.5 MG/3ML) 0.083% nebulizer solution Take 3 mLs (2.5 mg total) by nebulization every 6 (six) hours as needed for wheezing or shortness of breath. 150 mL 1   albuterol  (VENTOLIN  HFA) 108 (90 Base) MCG/ACT inhaler Inhale 2 puffs into the lungs every 6 (six) hours as needed for wheezing or shortness of breath. 8.5 each 6   ARIPiprazole  (ABILIFY ) 30 MG tablet Take 1 tablet (30 mg total) by mouth daily. 90 tablet 1   ascorbic acid  (VITAMIN C ) 500 MG tablet Take by mouth.     Budeson-Glycopyrrol-Formoterol  (BREZTRI  AEROSPHERE) 160-9-4.8 MCG/ACT AERO Inhale 2 puffs into the lungs 2 (two) times daily. 10.7 g 11   fluticasone  (FLONASE ) 50 MCG/ACT nasal spray Place 2 sprays into both nostrils daily. 16 g 6   methadone  (DOLOPHINE ) 10 MG tablet Take 100 mg by mouth daily.     Multiple Vitamin (MULTI-VITAMIN) tablet Take 1 tablet by mouth daily.     ondansetron  (ZOFRAN ) 4 MG tablet Take 1 tablet (4 mg  total) by mouth every 8 (eight) hours as needed for nausea or vomiting. 20 tablet 0   predniSONE  (DELTASONE ) 10 MG tablet Take 6 tabs in the AM day 1 and 2, 5 tabs days 3 and 4, decrease by 1 every other day until gone. 42 tablet 0   No current facility-administered medications for this visit.    Family History Family History  Problem  Relation Age of Onset   Diabetes Mother    Cancer Father        Pancreatic   Diabetes Maternal Aunt    Diabetes Maternal Grandmother    Dementia Maternal Grandfather    Heart disease Maternal Grandfather       Social History Social History   Tobacco Use   Smoking status: Former    Current packs/day: 0.00    Average packs/day: 0.5 packs/day for 49.0 years (24.5 ttl pk-yrs)    Types: Cigarettes    Start date: 12/13/1969    Quit date: 12/13/2018    Years since quitting: 4.9    Passive exposure: Past   Smokeless tobacco: Never  Vaping Use   Vaping status: Never Used  Substance Use Topics   Alcohol use: Not Currently    Comment: On occasion   Drug use: No       ROS Full ROS of systems performed and is otherwise negative there than what is stated in the HPI  Physical Exam Blood pressure (!) 150/74, pulse 98, temperature 97.9 F (36.6 C), height 5' 4 (1.626 m), weight 140 lb (63.5 kg), SpO2 93%.  No acute distress, good auscultation bilaterally, regular rate and rhythm, abdomen is soft, nontender nondistended with well-healed surgical scars.  Gluteal cleft exam performed in the presence of a chaperone.  She has draining purulence from her gluteal cleft.  There does appear to be several pits that I was able to express purulence from.  There was an opening that was draining so I packed this with gauze.  Data Reviewed I reviewed past medical history and her previous PCP visit for pilonidal cyst disease  I have personally reviewed the patient's imaging and medical records.    Assessment/Plan    The patient is a 73 year old who has a draining pilonidal cyst abscess.  It is draining quite well and does not need additional incision today.  She should complete her antibiotics and we will see her back in 4 weeks.  I discussed with her that once this heals we can perform pilonidal cyst procedure.  I discussed with her briefly that I start with the least invasive way to treat this and if  need be we can then perform a wider excision if she has recurrence of the disease.    Jayson MALVA Endow 11/22/2023, 7:14 AM

## 2023-11-24 ENCOUNTER — Ambulatory Visit (INDEPENDENT_AMBULATORY_CARE_PROVIDER_SITE_OTHER): Payer: 59 | Admitting: Family Medicine

## 2023-11-24 ENCOUNTER — Encounter: Payer: Self-pay | Admitting: Family Medicine

## 2023-11-24 VITALS — BP 125/73 | HR 109 | Temp 98.3°F | Wt 140.6 lb

## 2023-11-24 DIAGNOSIS — Z1211 Encounter for screening for malignant neoplasm of colon: Secondary | ICD-10-CM | POA: Diagnosis not present

## 2023-11-24 DIAGNOSIS — Z780 Asymptomatic menopausal state: Secondary | ICD-10-CM | POA: Diagnosis not present

## 2023-11-24 DIAGNOSIS — J441 Chronic obstructive pulmonary disease with (acute) exacerbation: Secondary | ICD-10-CM | POA: Diagnosis not present

## 2023-11-24 MED ORDER — SPACER/AERO-HOLDING CHAMBERS DEVI
0 refills | Status: AC
Start: 2023-11-24 — End: ?

## 2023-11-24 NOTE — Progress Notes (Signed)
 BP 125/73 (BP Location: Left Arm, Cuff Size: Normal)   Pulse (!) 109   Temp 98.3 F (36.8 C) (Oral)   Wt 140 lb 9.6 oz (63.8 kg)   SpO2 92%   BMI 24.13 kg/m    Subjective:    Patient ID: Kim Graham, female    DOB: 1951-04-30, 73 y.o.   MRN: [id number]  HPI: Kim Graham is a 73 y.o. female  Chief Complaint  Patient presents with   Hypertension   COPD COPD status: better Satisfied with current treatment?: yes Oxygen use: no Dyspnea frequency: often Cough frequency: often Rescue inhaler frequency: often   Limitation of activity: no Productive cough: no Pneumovax: Up to Date Influenza: Up to Date  Relevant past medical, surgical, family and social history reviewed and updated as indicated. Interim medical history since our last visit reviewed. Allergies and medications reviewed and updated.  Review of Systems  Constitutional: Negative.   Respiratory:  Positive for cough, shortness of breath and wheezing. Negative for apnea, choking, chest tightness and stridor.   Cardiovascular: Negative.   Musculoskeletal: Negative.   Psychiatric/Behavioral: Negative.      Per HPI unless specifically indicated above     Objective:    BP 125/73 (BP Location: Left Arm, Cuff Size: Normal)   Pulse (!) 109   Temp 98.3 F (36.8 C) (Oral)   Wt 140 lb 9.6 oz (63.8 kg)   SpO2 92%   BMI 24.13 kg/m   Wt Readings from Last 3 Encounters:  11/24/23 140 lb 9.6 oz (63.8 kg)  11/11/23 140 lb (63.5 kg)  11/08/23 138 lb 6.4 oz (62.8 kg)    Physical Exam Vitals and nursing note reviewed.  Constitutional:      General: She is not in acute distress.    Appearance: Normal appearance. She is not ill-appearing, toxic-appearing or diaphoretic.  HENT:     Head: Normocephalic and atraumatic.     Right Ear: External ear normal.     Left Ear: External ear normal.     Nose: Nose normal.     Mouth/Throat:     Mouth: Mucous membranes are moist.     Pharynx: Oropharynx is clear.  Eyes:      General: No scleral icterus.       Right eye: No discharge.        Left eye: No discharge.     Extraocular Movements: Extraocular movements intact.     Conjunctiva/sclera: Conjunctivae normal.     Pupils: Pupils are equal, round, and reactive to light.  Cardiovascular:     Rate and Rhythm: Normal rate and regular rhythm.     Pulses: Normal pulses.     Heart sounds: Normal heart sounds. No murmur heard.    No friction rub. No gallop.  Pulmonary:     Effort: Pulmonary effort is normal. No respiratory distress.     Breath sounds: No stridor. Wheezing present. No rhonchi or rales.  Chest:     Chest wall: No tenderness.  Musculoskeletal:        General: Normal range of motion.     Cervical back: Normal range of motion and neck supple.  Skin:    General: Skin is warm and dry.     Capillary Refill: Capillary refill takes less than 2 seconds.     Coloration: Skin is not jaundiced or pale.     Findings: No bruising, erythema, lesion or rash.  Neurological:     General: No focal deficit present.  Mental Status: She is alert and oriented to person, place, and time. Mental status is at baseline.  Psychiatric:        Mood and Affect: Mood normal.        Behavior: Behavior normal.        Thought Content: Thought content normal.        Judgment: Judgment normal.     Results for orders placed or performed in visit on 09/14/23  Veritor Flu A/B Waived   Collection Time: 09/14/23 11:30 AM  Result Value Ref Range   Influenza A Negative Negative   Influenza B Negative Negative  Novel Coronavirus, NAA ([facility])   Collection Time: 09/14/23 11:45 AM   Specimen: Nasopharyngeal(NP) swabs in vial transport medium  Result Value Ref Range   SARS-CoV-2, NAA Not Detected Not Detected      Assessment & Plan:   Problem List Items Addressed This Visit       Respiratory   Chronic obstructive pulmonary disease (HCC) - Primary   Feeling better. Tolerating her medicine. She does have some  trouble getting her 2nd puff of breztri  in- will send over spacer. Call with any concerns.       Relevant Medications   Spacer/Aero-Holding Ismael Maria   Other Visit Diagnoses       Postmenopausal estrogen deficiency       Due for DEXA. Scheduled today.   Relevant Orders   DG Bone Density     Screening for colon cancer       Cologuard ordered today.   Relevant Orders   Cologuard        Follow up plan: Return May, for physical.

## 2023-11-24 NOTE — Assessment & Plan Note (Signed)
 Feeling better. Tolerating her medicine. She does have some trouble getting her 2nd puff of breztri  in- will send over spacer. Call with any concerns.

## 2023-11-24 NOTE — Patient Instructions (Signed)
 Patient is scheduled at [facility] for Bone Density scan on 12/01/23 at 10:00 AM. Patient is not to take any Calcium Supplements the day of the scheduled appointment. If you can not make the scheduled appointment or need to reschedule. Please reach out to their facility directly at [phone].

## 2023-12-01 ENCOUNTER — Inpatient Hospital Stay: Admission: RE | Admit: 2023-12-01 | Payer: 59 | Source: Ambulatory Visit

## 2023-12-03 LAB — COLOGUARD

## 2023-12-09 ENCOUNTER — Ambulatory Visit: Payer: 59 | Admitting: General Surgery

## 2023-12-09 ENCOUNTER — Encounter: Payer: Self-pay | Admitting: General Surgery

## 2023-12-09 VITALS — BP 145/70 | HR 96 | Temp 98.0°F | Ht 64.0 in | Wt 140.0 lb

## 2023-12-09 DIAGNOSIS — L0501 Pilonidal cyst with abscess: Secondary | ICD-10-CM

## 2023-12-09 NOTE — Patient Instructions (Signed)
You have been seen today for a Pilonidal Cyst.  To keep this cyst as minimal as possible, you will want to shave or use Veet (Hair Remover) on this area to keep as much hair out of the tracts as you can, have a family member pull hair from the tracts if possible, keep the area clean and dry as possible. Wash with soap and water once daily and keep a guaze on this area at all times while draining.  If we excise this area (surgery) in the future, you will need to arrange to be out of work for approximately 1-2 weeks and then have a family member change the dressing 1-2 times daily until this heals from the inside out.   Please call our office with any questions or concerns.    Pilonidal Cyst A pilonidal cyst is a fluid-filled sac. It forms beneath the skin near your tailbone, at the top of the crease of your buttocks. A pilonidal cyst that is not large or infected may not cause symptoms or problems. If the cyst becomes irritated or infected, it may fill with pus. This causes pain and swelling (pilonidal abscess). An infected cyst may need to be treated with medicine, drained, or removed. CAUSES The cause of a pilonidal cyst is not known. One cause may be a hair that grows into your skin (ingrown hair). RISK FACTORS Pilonidal cysts are more common in boys and men. Risk factors include:  Having lots of hair near the crease of the buttocks.  Being overweight.  Having a pilonidal dimple.  Wearing tight clothing.  Not bathing or showering frequently.  Sitting for long periods of time. SIGNS AND SYMPTOMS Signs and symptoms of a pilonidal cyst may include:  Redness.  Pain and tenderness.  Warmth.  Swelling.  Pus.  Fever. DIAGNOSIS Your health care provider may diagnose a pilonidal cyst based on your symptoms and a physical exam. The health care provider may do a blood test to check for infection. If your cyst is draining pus, your health care provider may take a sample of the  drainage to be tested at a laboratory. TREATMENT Surgery is the usual treatment for an infected pilonidal cyst. You may also have to take medicines before surgery. The type of surgery you have depends on the size and severity of the infected cyst. The different kinds of surgery include:  Incision and drainage. This is a procedure to open and drain the cyst.  Marsupialization. In this procedure, a large cyst or abscess may be opened and kept open by stitching the edges of the skin to the cyst walls.  Cyst removal. This procedure involves opening the skin and removing all or part of the cyst. HOME CARE INSTRUCTIONS  Follow all of your surgeon's instructions carefully if you had surgery.  Take medicines only as directed by your health care provider.  If you were prescribed an antibiotic medicine, finish it all even if you start to feel better.  Keep the area around your pilonidal cyst clean and dry.  Clean the area as directed by your health care provider. Pat the area dry with a clean towel. Do not rub it as this may cause bleeding.  Remove hair from the area around the cyst as directed by your health care provider.  Do not wear tight clothing or sit in one place for long periods of time.  There are many different ways to close and cover an incision, including stitches, skin glue, and adhesive strips. Follow  your health care provider's instructions on:  Incision care.  Bandage (dressing) changes and removal.  Incision closure removal. SEEK MEDICAL CARE IF:   You have drainage, redness, swelling, or pain at the site of the cyst.  You have a fever.   This information is not intended to replace advice given to you by your health care provider. Make sure you discuss any questions you have with your health care provider.   Document Released: 10/23/2000 Document Revised: 11/16/2014 Document Reviewed: 03/15/2014 Elsevier Interactive Patient Education [employer]

## 2023-12-09 NOTE — Progress Notes (Signed)
Outpatient Surgical Follow Up  12/09/2023  Kim Graham is an 73 y.o. female.   Chief Complaint  Patient presents with   Follow-up    HPI: The patient returns after having a spontaneous drainage from a pilonidal cyst abscess.  She reports that the drainage has subsided.  She denies any fevers or chills.  She says that she is not having any pain from the area.  She had a bad experience from surgery several years ago in interested in any surgical treatment of this disease.  Past Medical History:  Diagnosis Date   Acute cholecystitis 12/09/2021   Bile leak    COPD (chronic obstructive pulmonary disease) (HCC)    Hepatitis    History of kidney stones    Hypertension    Sepsis secondary to UTI [facility])     Past Surgical History:  Procedure Laterality Date   CYSTOSCOPY W/ URETERAL STENT PLACEMENT Right 12/09/2021   Procedure: CYSTOSCOPY WITH RETROGRADE PYELOGRAM/URETERAL STENT PLACEMENT;  Surgeon: Riki Altes, MD;  Location: [facility] ORS;  Service: Urology;  Laterality: Right;   CYSTOSCOPY/URETEROSCOPY/HOLMIUM LASER/STENT PLACEMENT Bilateral 02/11/2022   Procedure: CYSTOSCOPY/URETEROSCOPY/HOLMIUM LASER/STENT PLACEMENT & RIGHT URETERAL STENT EXCHANGE;  Surgeon: Riki Altes, MD;  Location: [facility] ORS;  Service: Urology;  Laterality: Bilateral;   CYSTOSCOPY/URETEROSCOPY/HOLMIUM LASER/STENT PLACEMENT Bilateral 05/19/2022   Procedure: CYSTOSCOPY/URETEROSCOPY/HOLMIUM LASER/STENT PLACEMENT;  Surgeon: Riki Altes, MD;  Location: [facility] ORS;  Service: Urology;  Laterality: Bilateral;   ENDOSCOPIC RETROGRADE CHOLANGIOPANCREATOGRAPHY (ERCP) WITH PROPOFOL N/A 12/11/2021   Procedure: ENDOSCOPIC RETROGRADE CHOLANGIOPANCREATOGRAPHY (ERCP) WITH PROPOFOL;  Surgeon: [name], MD;  Location: [facility] ENDOSCOPY;  Service: Endoscopy;  Laterality: N/A;   ERCP N/A 03/10/2022   Procedure: ENDOSCOPIC RETROGRADE CHOLANGIOPANCREATOGRAPHY (ERCP);  Surgeon: [name], MD;  Location: [facility] ENDOSCOPY;  Service:  Endoscopy;  Laterality: N/A;  Stent removal   ERCP N/A 06/23/2022   Procedure: ENDOSCOPIC RETROGRADE CHOLANGIOPANCREATOGRAPHY (ERCP);  Surgeon: [name], MD;  Location: [facility] ENDOSCOPY;  Service: Endoscopy;  Laterality: N/A;  stent removal    Family History  Problem Relation Age of Onset   Diabetes Mother    Cancer Father        Pancreatic   Diabetes Maternal Aunt    Diabetes Maternal Grandmother    Dementia Maternal Grandfather    Heart disease Maternal Grandfather     Social History:  reports that she quit smoking about 4 years ago. Her smoking use included cigarettes. She started smoking about 54 years ago. She has a 24.5 pack-year smoking history. She has been exposed to tobacco smoke. She has never used smokeless tobacco. She reports that she does not currently use alcohol. She reports that she does not use drugs.  Allergies:  Allergies  Allergen Reactions   Doxycycline Diarrhea    Very bad diarrhea    Medications reviewed.    ROS Full ROS performed and is otherwise negative other than what is stated in HPI   BP (!) 145/70   Pulse 96   Temp 98 F (36.7 C)   Ht 5\' 4"  (1.626 m)   Wt 140 lb (63.5 kg)   SpO2 93%   BMI 24.03 kg/m   Physical Exam  Pit at the gluteal cleft.  There is some fibrinous exudate in the gluteal cleft but no areas of fluctuance or active abscess.  More superior to this at the area of previous abscess is healing over well.   No results found for this or any previous visit (from the past 48 hours). No results found.  Assessment/Plan: Patient with pilonidal cyst disease who had an abscess.  This is healed up although she still has some fibrinous drainage.  I discussed the natural history of pilonidal cyst disease and the recommendation for surgery given she has had an abscess.  She is adamant about not having surgery as she had a bad experience previously.  I talked with her that this will almost certainly come back at some point given the  large pit at her gluteal cleft and will cause an abscess.  She says that if it does then she can get an incision and drainage and she would consider surgery at that time.  She will call us back if this happens  Baker Pierini, M.D. [facility]

## 2023-12-21 ENCOUNTER — Ambulatory Visit: Payer: Self-pay | Admitting: General Surgery

## 2023-12-21 ENCOUNTER — Encounter: Payer: Self-pay | Admitting: General Surgery

## 2023-12-21 ENCOUNTER — Ambulatory Visit (INDEPENDENT_AMBULATORY_CARE_PROVIDER_SITE_OTHER): Payer: 59 | Admitting: General Surgery

## 2023-12-21 VITALS — BP 123/76 | HR 92 | Temp 98.5°F | Ht 64.0 in | Wt 143.4 lb

## 2023-12-21 DIAGNOSIS — L0501 Pilonidal cyst with abscess: Secondary | ICD-10-CM

## 2023-12-21 NOTE — Progress Notes (Signed)
Outpatient Surgical Follow Up CC: Pilonidal Cyst Disease 12/21/2023  Kim Graham is an 73 y.o. female.   Chief Complaint  Patient presents with   Follow-up    Pilonidal cyst    HPI: Kim Graham returns today in consultation pilonidal cyst disease.  I had seen her in the past for a pilonidal cyst abscess.  She return to my office recently but at that time did not want to undergo surgical intervention for this.  She returns today because she has changed her mind.  She reports that she continues to have drainage at the area.  She denies any fevers or chills.  She does have some pain in her gluteal cleft.  She has no problems having a bowel movement and no blood in her stool.  She is a little bit nervous about surgery because she has a history of COPD but she does not require any home oxygen use.  Past Medical History:  Diagnosis Date   Acute cholecystitis 12/09/2021   Bile leak    COPD (chronic obstructive pulmonary disease) (HCC)    Hepatitis    History of kidney stones    Hypertension    Sepsis secondary to UTI [facility])     Past Surgical History:  Procedure Laterality Date   CYSTOSCOPY W/ URETERAL STENT PLACEMENT Right 12/09/2021   Procedure: CYSTOSCOPY WITH RETROGRADE PYELOGRAM/URETERAL STENT PLACEMENT;  Surgeon: Riki Altes, MD;  Location: [facility] ORS;  Service: Urology;  Laterality: Right;   CYSTOSCOPY/URETEROSCOPY/HOLMIUM LASER/STENT PLACEMENT Bilateral 02/11/2022   Procedure: CYSTOSCOPY/URETEROSCOPY/HOLMIUM LASER/STENT PLACEMENT & RIGHT URETERAL STENT EXCHANGE;  Surgeon: Riki Altes, MD;  Location: [facility] ORS;  Service: Urology;  Laterality: Bilateral;   CYSTOSCOPY/URETEROSCOPY/HOLMIUM LASER/STENT PLACEMENT Bilateral 05/19/2022   Procedure: CYSTOSCOPY/URETEROSCOPY/HOLMIUM LASER/STENT PLACEMENT;  Surgeon: Riki Altes, MD;  Location: [facility] ORS;  Service: Urology;  Laterality: Bilateral;   ENDOSCOPIC RETROGRADE CHOLANGIOPANCREATOGRAPHY (ERCP) WITH PROPOFOL N/A 12/11/2021   Procedure:  ENDOSCOPIC RETROGRADE CHOLANGIOPANCREATOGRAPHY (ERCP) WITH PROPOFOL;  Surgeon: [name], MD;  Location: [facility] ENDOSCOPY;  Service: Endoscopy;  Laterality: N/A;   ERCP N/A 03/10/2022   Procedure: ENDOSCOPIC RETROGRADE CHOLANGIOPANCREATOGRAPHY (ERCP);  Surgeon: [name], MD;  Location: [facility] ENDOSCOPY;  Service: Endoscopy;  Laterality: N/A;  Stent removal   ERCP N/A 06/23/2022   Procedure: ENDOSCOPIC RETROGRADE CHOLANGIOPANCREATOGRAPHY (ERCP);  Surgeon: [name], MD;  Location: [facility] ENDOSCOPY;  Service: Endoscopy;  Laterality: N/A;  stent removal    Family History  Problem Relation Age of Onset   Diabetes Mother    Cancer Father        Pancreatic   Diabetes Maternal Aunt    Diabetes Maternal Grandmother    Dementia Maternal Grandfather    Heart disease Maternal Grandfather     Social History:  reports that she quit smoking about 5 years ago. Her smoking use included cigarettes. She started smoking about 54 years ago. She has a 24.5 pack-year smoking history. She has been exposed to tobacco smoke. She has never used smokeless tobacco. She reports that she does not currently use alcohol. She reports that she does not use drugs.  Allergies:  Allergies  Allergen Reactions   Doxycycline Diarrhea    Very bad diarrhea    Medications reviewed.    ROS Full ROS performed and is otherwise negative other than what is stated in HPI   BP 123/76   Pulse 92   Temp 98.5 F (36.9 C) (Oral)   Ht 5\' 4"  (1.626 m)   Wt 143 lb 6.4 oz (65 kg)  SpO2 95%   BMI 24.61 kg/m   Physical Exam  Alert and oriented x 3, good auscultation bilaterally, regular rate and rhythm, abdomen soft, nontender nondistended Gluteal cleft exam performed in the presence of a chaperone.  At the top of her gluteal cleft there is 1 pit that is the source of some purulent drainage.  She has another pit that is quite a bit lower down.  This does not seem to have any drainage from it.  There is no surrounding  cellulitis and there is no areas of fluctuance to suggest undrained abscess.   No results found for this or any previous visit (from the past 48 hours). No results found.  Assessment/Plan: The patient is a 73 year old who has pilonidal cyst disease.  She is draining today and has no undrained fluid collections.  She is ready to have surgery for this.  I discussed with her given that she is continue to drain from this that I do not think a minimally invasive approach would work and that I would need to perform an extensive wide local excision of the pilonidal cyst area.  I discussed the risk, benefits alternatives of the procedure including risk of infection, bleeding and recurrence of the disease.  I also discussed with her that she will need someone to pack the area after surgery.  She says that her boyfriend can do that.  We will schedule her for 17 February.   Baker Pierini, M.D. [facility]

## 2023-12-21 NOTE — Patient Instructions (Signed)
You have been seen today for a Pilonidal Cyst.  To keep this cyst as minimal as possible, you will want to shave or use Veet (Hair Remover) on this area to keep as much hair out of the tracts as you can, have a family member pull hair from the tracts if possible, keep the area clean and dry as possible. Wash with soap and water once daily and keep a guaze on this area at all times while draining.  If we excise this area (surgery) in the future, you will need to arrange to be out of work for approximately 1-2 weeks and then have a family member change the dressing 1-2 times daily until this heals from the inside out.   Please call our office with any questions or concerns.    Pilonidal Cyst A pilonidal cyst is a fluid-filled sac. It forms beneath the skin near your tailbone, at the top of the crease of your buttocks. A pilonidal cyst that is not large or infected may not cause symptoms or problems. If the cyst becomes irritated or infected, it may fill with pus. This causes pain and swelling (pilonidal abscess). An infected cyst may need to be treated with medicine, drained, or removed. CAUSES The cause of a pilonidal cyst is not known. One cause may be a hair that grows into your skin (ingrown hair). RISK FACTORS Pilonidal cysts are more common in boys and men. Risk factors include:  Having lots of hair near the crease of the buttocks.  Being overweight.  Having a pilonidal dimple.  Wearing tight clothing.  Not bathing or showering frequently.  Sitting for long periods of time. SIGNS AND SYMPTOMS Signs and symptoms of a pilonidal cyst may include:  Redness.  Pain and tenderness.  Warmth.  Swelling.  Pus.  Fever. DIAGNOSIS Your health care provider may diagnose a pilonidal cyst based on your symptoms and a physical exam. The health care provider may do a blood test to check for infection. If your cyst is draining pus, your health care provider may take a sample of the  drainage to be tested at a laboratory. TREATMENT Surgery is the usual treatment for an infected pilonidal cyst. You may also have to take medicines before surgery. The type of surgery you have depends on the size and severity of the infected cyst. The different kinds of surgery include:  Incision and drainage. This is a procedure to open and drain the cyst.  Marsupialization. In this procedure, a large cyst or abscess may be opened and kept open by stitching the edges of the skin to the cyst walls.  Cyst removal. This procedure involves opening the skin and removing all or part of the cyst. HOME CARE INSTRUCTIONS  Follow all of your surgeon's instructions carefully if you had surgery.  Take medicines only as directed by your health care provider.  If you were prescribed an antibiotic medicine, finish it all even if you start to feel better.  Keep the area around your pilonidal cyst clean and dry.  Clean the area as directed by your health care provider. Pat the area dry with a clean towel. Do not rub it as this may cause bleeding.  Remove hair from the area around the cyst as directed by your health care provider.  Do not wear tight clothing or sit in one place for long periods of time.  There are many different ways to close and cover an incision, including stitches, skin glue, and adhesive strips. Follow  your health care provider's instructions on:  Incision care.  Bandage (dressing) changes and removal.  Incision closure removal. SEEK MEDICAL CARE IF:   You have drainage, redness, swelling, or pain at the site of the cyst.  You have a fever.   This information is not intended to replace advice given to you by your health care provider. Make sure you discuss any questions you have with your health care provider.   Document Released: 10/23/2000 Document Revised: 11/16/2014 Document Reviewed: 03/15/2014 Elsevier Interactive Patient Education [employer]

## 2023-12-22 ENCOUNTER — Telehealth: Payer: Self-pay | Admitting: General Surgery

## 2023-12-22 NOTE — Telephone Encounter (Signed)
Called patient back, she is now informed of all dates regarding surgery.

## 2023-12-22 NOTE — Telephone Encounter (Signed)
Tried calling patient, mailbox full.  If patient calls back, please inform her of the following regarding scheduled surgery with Dr. Maurine Minister.    Pre-Admission date/time, and Surgery date at [facility].  Surgery Date: 12/29/23 Preadmission Testing Date: 12/28/23 (phone 8a-1p)  Also patient will need to call at [phone], between 1-3:00pm the day before surgery, to find out what time to arrive for surgery.

## 2023-12-28 ENCOUNTER — Encounter
Admission: RE | Admit: 2023-12-28 | Discharge: 2023-12-28 | Disposition: A | Discharge status: Home / Self Care | Payer: 59 | Source: Ambulatory Visit | Attending: General Surgery

## 2023-12-28 ENCOUNTER — Encounter: Payer: Self-pay | Admitting: Urgent Care

## 2023-12-28 ENCOUNTER — Encounter
Admission: RE | Admit: 2023-12-28 | Discharge: 2023-12-28 | Disposition: A | Discharge status: Home / Self Care | Payer: 59 | Source: Ambulatory Visit | Attending: General Surgery | Admitting: General Surgery

## 2023-12-28 ENCOUNTER — Other Ambulatory Visit: Payer: Self-pay

## 2023-12-28 VITALS — Ht 64.0 in | Wt 140.0 lb

## 2023-12-28 DIAGNOSIS — Z01812 Encounter for preprocedural laboratory examination: Secondary | ICD-10-CM | POA: Diagnosis present

## 2023-12-28 DIAGNOSIS — J441 Chronic obstructive pulmonary disease with (acute) exacerbation: Secondary | ICD-10-CM | POA: Diagnosis not present

## 2023-12-28 DIAGNOSIS — Z01818 Encounter for other preprocedural examination: Secondary | ICD-10-CM | POA: Diagnosis not present

## 2023-12-28 DIAGNOSIS — N1831 Chronic kidney disease, stage 3a: Secondary | ICD-10-CM

## 2023-12-28 DIAGNOSIS — I129 Hypertensive chronic kidney disease with stage 1 through stage 4 chronic kidney disease, or unspecified chronic kidney disease: Secondary | ICD-10-CM | POA: Diagnosis not present

## 2023-12-28 DIAGNOSIS — Z01810 Encounter for preprocedural cardiovascular examination: Secondary | ICD-10-CM | POA: Diagnosis not present

## 2023-12-28 DIAGNOSIS — I10 Essential (primary) hypertension: Secondary | ICD-10-CM

## 2023-12-28 HISTORY — DX: Thrombocytopenia, unspecified: D69.6

## 2023-12-28 HISTORY — DX: Venous insufficiency (chronic) (peripheral): I87.2

## 2023-12-28 HISTORY — DX: Chronic kidney disease, stage 3a: N18.31

## 2023-12-28 HISTORY — DX: Calculus of kidney: N20.0

## 2023-12-28 HISTORY — DX: Prediabetes: R73.03

## 2023-12-28 HISTORY — DX: Depression, unspecified: F32.A

## 2023-12-28 HISTORY — DX: Chronic viral hepatitis C: B18.2

## 2023-12-28 HISTORY — DX: Opioid dependence, uncomplicated: F11.20

## 2023-12-28 LAB — CBC
HCT: 43.6 % (ref 36.0–46.0)
Hemoglobin: 13.9 g/dL (ref 12.0–15.0)
MCH: 28.9 pg (ref 26.0–34.0)
MCHC: 31.9 g/dL (ref 30.0–36.0)
MCV: 90.6 fL (ref 80.0–100.0)
Platelets: 177 10*3/uL (ref 150–400)
RBC: 4.81 MIL/uL (ref 3.87–5.11)
RDW: 15.6 % — ABNORMAL HIGH (ref 11.5–15.5)
WBC: 8.1 10*3/uL (ref 4.0–10.5)
nRBC: 0 % (ref 0.0–0.2)

## 2023-12-28 NOTE — Patient Instructions (Addendum)
Your procedure is scheduled on: Wednesday 12/29/23 To find out your arrival time, please call [phone] between 1PM - 3PM on:   Tuesday 12/28/23 Report to the Registration Desk on the 1st floor of the Medical Mall. FREE Valet parking is available.  If your arrival time is 6:00 am, do not arrive before that time as the [facility] entrance doors do not open until 6:00 am.  REMEMBER: Instructions that are not followed completely may result in serious medical risk, up to and including death; or upon the discretion of your surgeon and anesthesiologist your surgery may need to be rescheduled.  Do not eat food after midnight the night before surgery.  No gum chewing or hard candies.  You may however, drink CLEAR liquids up to 2 hours before you are scheduled to arrive for your surgery. Do not drink anything within 2 hours of your scheduled arrival time.  Clear liquids include: - water  - apple juice without pulp - gatorade (not RED colors) - black coffee or tea (Do NOT add milk or creamers to the coffee or tea) Do NOT drink anything that is not on this list.  Type 1 and Type 2 diabetics should only drink water.  One week prior to surgery: Stop Anti-inflammatories (NSAIDS) such as Advil, Aleve, Ibuprofen, Motrin, Naproxen, Naprosyn and Aspirin based products such as Excedrin, Goody's Powder, BC Powder. You may however, continue to take Tylenol if needed for pain up until the day of surgery.  Stop ANY OVER THE COUNTER supplements and vitamins until after surgery.  Continue taking all prescribed medications.   TAKE ONLY THESE MEDICATIONS THE MORNING OF SURGERY WITH A SIP OF WATER:  ARIPiprazole (ABILIFY) 30 MG tablet  methadone (DOLOPHINE)    Use inhalers on the day of surgery and bring to the hospital. Use your Breztri and Nebulizer. Bring your Albuterol with you to surgery.  No Alcohol for 24 hours before or after surgery.  No Smoking including e-cigarettes for 24 hours before  surgery.  No chewable tobacco products for at least 6 hours before surgery.  No nicotine patches on the day of surgery.  Do not use any "recreational" drugs for at least a week (preferably 2 weeks) before your surgery.  Please be advised that the combination of cocaine and anesthesia may have negative outcomes, up to and including death. If you test positive for cocaine, your surgery will be cancelled.  On the morning of surgery brush your teeth with toothpaste and water, you may rinse your mouth with mouthwash if you wish. Do not swallow any toothpaste or mouthwash.  Use CHG Soap or wipes as directed on instruction sheet.  Do not wear lotions, powders, or perfumes.   Do not shave body hair from the neck down 48 hours before surgery.  Wear comfortable clothing (specific to your surgery type) to the hospital.  Do not wear jewelry, make-up, hairpins, clips or nail polish.  For welded (permanent) jewelry: bracelets, anklets, waist bands, etc.  Please have this removed prior to surgery.  If it is not removed, there is a chance that hospital personnel will need to cut it off on the day of surgery. Contact lenses, hearing aids and dentures may not be worn into surgery.  Do not bring valuables to the hospital. [facility] is not responsible for any missing/lost belongings or valuables.   Notify your doctor if there is any change in your medical condition (cold, fever, infection).  If you are being discharged the day of  surgery, you will not be allowed to drive home. You will need a responsible individual to drive you home and stay with you for 24 hours after surgery.   If you are taking public transportation, you will need to have a responsible individual with you.  If you are being admitted to the hospital overnight, leave your suitcase in the car. After surgery it may be brought to your room.  In case of increased patient census, it may be necessary for you, the patient, to continue  your postoperative care in the Same Day Surgery department.  After surgery, you can help prevent lung complications by doing breathing exercises.  Take deep breaths and cough every 1-2 hours. Your doctor may order a device called an Incentive Spirometer to help you take deep breaths. When coughing or sneezing, hold a pillow firmly against your incision with both hands. This is called "splinting." Doing this helps protect your incision. It also decreases belly discomfort.  Surgery Visitation Policy:  Patients undergoing a surgery or procedure may have two family members or support persons with them as long as the person is not COVID-19 positive or experiencing its symptoms.   Inpatient Visitation:    Visiting hours are 7 a.m. to 8 p.m. Up to four visitors are allowed at one time in a patient room. The visitors may rotate out with other people during the day. One designated support person (adult) may remain overnight.  Due to an increase in RSV and influenza rates and associated hospitalizations, children ages 26 and under will not be able to visit patients in [facility]. Masks continue to be strongly recommended.  Please call the Pre-admissions Testing Dept. at [phone] if you have any questions about these instructions.     Preparing for Surgery with CHLORHEXIDINE GLUCONATE (CHG) Soap  Chlorhexidine Gluconate (CHG) Soap  o An antiseptic cleaner that kills germs and bonds with the skin to continue killing germs even after washing  o Used for showering the night before surgery and morning of surgery  Before surgery, you can play an important role by reducing the number of germs on your skin.  CHG (Chlorhexidine gluconate) soap is an antiseptic cleanser which kills germs and bonds with the skin to continue killing germs even after washing.  Please do not use if you have an allergy to CHG or antibacterial soaps. If your skin becomes reddened/irritated stop using the  CHG.  1. Shower the NIGHT BEFORE SURGERY and the MORNING OF SURGERY with CHG soap.  2. If you choose to wash your hair, wash your hair first as usual with your normal shampoo.  3. After shampooing, rinse your hair and body thoroughly to remove the shampoo.  4. Use CHG as you would any other liquid soap. You can apply CHG directly to the skin and wash gently with a scrungie or a clean washcloth.  5. Apply the CHG soap to your body only from the neck down. Do not use on open wounds or open sores. Avoid contact with your eyes, ears, mouth, and genitals (private parts). Wash face and genitals (private parts) with your normal soap.  6. Wash thoroughly, paying special attention to the area where your surgery will be performed.  7. Thoroughly rinse your body with warm water.  8. Do not shower/wash with your normal soap after using and rinsing off the CHG soap.  9. Pat yourself dry with a clean towel.  10. Wear clean pajamas to bed the night before surgery.  12. Place clean sheets on your bed the night of your first shower and do not sleep with pets.  13. Shower again with the CHG soap on the day of surgery prior to arriving at the hospital.  14. Do not apply any deodorants/lotions/powders.  15. Please wear clean clothes to the hospital.

## 2023-12-29 ENCOUNTER — Other Ambulatory Visit: Payer: Self-pay

## 2023-12-29 ENCOUNTER — Ambulatory Visit: Payer: 59

## 2023-12-29 ENCOUNTER — Encounter
Admission: RE | Disposition: A | Discharge status: Home / Self Care | Payer: Self-pay | Source: Ambulatory Visit | Attending: General Surgery

## 2023-12-29 ENCOUNTER — Encounter: Payer: Self-pay | Admitting: General Surgery

## 2023-12-29 ENCOUNTER — Ambulatory Visit
Admission: RE | Admit: 2023-12-29 | Discharge: 2023-12-29 | Disposition: A | Discharge status: Home / Self Care | Payer: 59 | Source: Ambulatory Visit | Attending: General Surgery | Admitting: General Surgery

## 2023-12-29 DIAGNOSIS — I129 Hypertensive chronic kidney disease with stage 1 through stage 4 chronic kidney disease, or unspecified chronic kidney disease: Secondary | ICD-10-CM | POA: Insufficient documentation

## 2023-12-29 DIAGNOSIS — Z87891 Personal history of nicotine dependence: Secondary | ICD-10-CM | POA: Insufficient documentation

## 2023-12-29 DIAGNOSIS — L0501 Pilonidal cyst with abscess: Secondary | ICD-10-CM | POA: Diagnosis not present

## 2023-12-29 DIAGNOSIS — J449 Chronic obstructive pulmonary disease, unspecified: Secondary | ICD-10-CM | POA: Insufficient documentation

## 2023-12-29 DIAGNOSIS — Z79899 Other long term (current) drug therapy: Secondary | ICD-10-CM | POA: Insufficient documentation

## 2023-12-29 DIAGNOSIS — N1831 Chronic kidney disease, stage 3a: Secondary | ICD-10-CM | POA: Diagnosis not present

## 2023-12-29 DIAGNOSIS — L0591 Pilonidal cyst without abscess: Secondary | ICD-10-CM

## 2023-12-29 HISTORY — PX: PILONIDAL CYST EXCISION: SHX744

## 2023-12-29 SURGERY — EXCISION, PILONIDAL CYST, EXTENSIVE
Anesthesia: General

## 2023-12-29 MED ORDER — CHLORHEXIDINE GLUCONATE 0.12 % MT SOLN
OROMUCOSAL | Status: AC
  Filled 2023-12-29: qty 15

## 2023-12-29 MED ORDER — CEFAZOLIN SODIUM-DEXTROSE 2-4 GM/100ML-% IV SOLN
INTRAVENOUS | Status: AC
  Filled 2023-12-29: qty 100

## 2023-12-29 MED ORDER — EPHEDRINE SULFATE-NACL 50-0.9 MG/10ML-% IV SOSY
PREFILLED_SYRINGE | INTRAVENOUS | Status: DC | PRN
  Administered 2023-12-29: 10 mg via INTRAVENOUS

## 2023-12-29 MED ORDER — LACTATED RINGERS IV SOLN
INTRAVENOUS | Status: DC | PRN
Start: 2023-12-29 — End: 2023-12-29

## 2023-12-29 MED ORDER — PHENYLEPHRINE 80 MCG/ML (10ML) SYRINGE FOR IV PUSH (FOR BLOOD PRESSURE SUPPORT)
PREFILLED_SYRINGE | INTRAVENOUS | Status: AC
  Filled 2023-12-29: qty 10

## 2023-12-29 MED ORDER — HYDROCODONE-ACETAMINOPHEN 5-325 MG PO TABS
1.0000 | ORAL_TABLET | Freq: Four times a day (QID) | ORAL | 0 refills | Status: DC | PRN
Start: 2023-12-29 — End: 2024-01-13

## 2023-12-29 MED ORDER — FENTANYL CITRATE (PF) 100 MCG/2ML IJ SOLN
INTRAMUSCULAR | Status: DC | PRN
  Administered 2023-12-29: 50 ug via INTRAVENOUS

## 2023-12-29 MED ORDER — CHLORHEXIDINE GLUCONATE CLOTH 2 % EX PADS
6.0000 | MEDICATED_PAD | Freq: Once | CUTANEOUS | Status: AC
  Administered 2023-12-29: 6 via TOPICAL

## 2023-12-29 MED ORDER — SUCCINYLCHOLINE CHLORIDE 200 MG/10ML IV SOSY
PREFILLED_SYRINGE | INTRAVENOUS | Status: AC
  Filled 2023-12-29: qty 10

## 2023-12-29 MED ORDER — CHLORHEXIDINE GLUCONATE CLOTH 2 % EX PADS
6.0000 | MEDICATED_PAD | Freq: Once | CUTANEOUS | Status: AC
Start: 2023-12-29 — End: 2023-12-29
  Administered 2023-12-29: 6 via TOPICAL

## 2023-12-29 MED ORDER — FENTANYL CITRATE (PF) 100 MCG/2ML IJ SOLN
INTRAMUSCULAR | Status: AC
  Filled 2023-12-29: qty 2

## 2023-12-29 MED ORDER — MIDAZOLAM HCL 2 MG/2ML IJ SOLN
INTRAMUSCULAR | Status: AC
  Filled 2023-12-29: qty 2

## 2023-12-29 MED ORDER — DEXAMETHASONE SODIUM PHOSPHATE 10 MG/ML IJ SOLN
INTRAMUSCULAR | Status: DC | PRN
  Administered 2023-12-29: 10 mg via INTRAVENOUS

## 2023-12-29 MED ORDER — PROPOFOL 1000 MG/100ML IV EMUL
INTRAVENOUS | Status: AC
  Filled 2023-12-29: qty 100

## 2023-12-29 MED ORDER — KETOROLAC TROMETHAMINE 30 MG/ML IJ SOLN
INTRAMUSCULAR | Status: DC | PRN
  Administered 2023-12-29: 30 mg via INTRAVENOUS

## 2023-12-29 MED ORDER — ACETAMINOPHEN 10 MG/ML IV SOLN
INTRAVENOUS | Status: DC | PRN
Start: 2023-12-29 — End: 2023-12-29
  Administered 2023-12-29: 1000 mg via INTRAVENOUS

## 2023-12-29 MED ORDER — BUPIVACAINE-EPINEPHRINE (PF) 0.5% -1:200000 IJ SOLN
INTRAMUSCULAR | Status: AC
  Filled 2023-12-29: qty 10

## 2023-12-29 MED ORDER — LIDOCAINE HCL (CARDIAC) PF 100 MG/5ML IV SOSY
PREFILLED_SYRINGE | INTRAVENOUS | Status: DC | PRN
  Administered 2023-12-29: 60 mg via INTRAVENOUS

## 2023-12-29 MED ORDER — ORAL CARE MOUTH RINSE: 15.0000 mL | Freq: Once | OROMUCOSAL | Status: AC

## 2023-12-29 MED ORDER — SUCCINYLCHOLINE CHLORIDE 200 MG/10ML IV SOSY
PREFILLED_SYRINGE | INTRAVENOUS | Status: DC | PRN
  Administered 2023-12-29: 60 mg via INTRAVENOUS

## 2023-12-29 MED ORDER — MIDAZOLAM HCL 2 MG/2ML IJ SOLN
INTRAMUSCULAR | Status: DC | PRN
  Administered 2023-12-29: 2 mg via INTRAVENOUS

## 2023-12-29 MED ORDER — OXYCODONE HCL 5 MG/5ML PO SOLN: 5.0000 mg | Freq: Once | ORAL | Status: AC | PRN

## 2023-12-29 MED ORDER — DEXAMETHASONE SODIUM PHOSPHATE 10 MG/ML IJ SOLN
INTRAMUSCULAR | Status: AC
  Filled 2023-12-29: qty 1

## 2023-12-29 MED ORDER — ONDANSETRON HCL 4 MG/2ML IJ SOLN
INTRAMUSCULAR | Status: DC | PRN
  Administered 2023-12-29: 4 mg via INTRAVENOUS

## 2023-12-29 MED ORDER — FENTANYL CITRATE (PF) 100 MCG/2ML IJ SOLN
25.0000 ug | INTRAMUSCULAR | Status: DC | PRN
  Administered 2023-12-29 (×4): 25 ug via INTRAVENOUS

## 2023-12-29 MED ORDER — CEFAZOLIN SODIUM-DEXTROSE 2-4 GM/100ML-% IV SOLN
2.0000 g | INTRAVENOUS | Status: AC
Start: 2023-12-29 — End: 2023-12-29
  Administered 2023-12-29: 2 g via INTRAVENOUS

## 2023-12-29 MED ORDER — KETOROLAC TROMETHAMINE 30 MG/ML IJ SOLN
INTRAMUSCULAR | Status: AC
  Filled 2023-12-29: qty 1

## 2023-12-29 MED ORDER — ACETAMINOPHEN 10 MG/ML IV SOLN
INTRAVENOUS | Status: AC
  Filled 2023-12-29: qty 100

## 2023-12-29 MED ORDER — OXYCODONE HCL 5 MG PO TABS
5.0000 mg | ORAL_TABLET | Freq: Once | ORAL | Status: AC | PRN
  Administered 2023-12-29: 5 mg via ORAL

## 2023-12-29 MED ORDER — IBUPROFEN 800 MG PO TABS: 800.0000 mg | ORAL_TABLET | Freq: Three times a day (TID) | ORAL | 0 refills | Status: DC | PRN

## 2023-12-29 MED ORDER — OXYCODONE HCL 5 MG PO TABS
ORAL_TABLET | ORAL | Status: AC
  Filled 2023-12-29: qty 1

## 2023-12-29 MED ORDER — LACTATED RINGERS IV SOLN: INTRAVENOUS | Status: DC

## 2023-12-29 MED ORDER — BUPIVACAINE LIPOSOME 1.3 % IJ SUSP
INTRAMUSCULAR | Status: AC
  Filled 2023-12-29: qty 10

## 2023-12-29 MED ORDER — BUPIVACAINE LIPOSOME 1.3 % IJ SUSP
INTRAMUSCULAR | Status: DC | PRN
  Administered 2023-12-29: 20 mL via INTRAMUSCULAR

## 2023-12-29 MED ORDER — PHENYLEPHRINE 80 MCG/ML (10ML) SYRINGE FOR IV PUSH (FOR BLOOD PRESSURE SUPPORT)
PREFILLED_SYRINGE | INTRAVENOUS | Status: DC | PRN
  Administered 2023-12-29: 160 ug via INTRAVENOUS
  Administered 2023-12-29: 80 ug via INTRAVENOUS

## 2023-12-29 MED ORDER — ONDANSETRON HCL 4 MG/2ML IJ SOLN
INTRAMUSCULAR | Status: AC
  Filled 2023-12-29: qty 2

## 2023-12-29 MED ORDER — CHLORHEXIDINE GLUCONATE 0.12 % MT SOLN
15.0000 mL | Freq: Once | OROMUCOSAL | Status: AC
  Administered 2023-12-29: 15 mL via OROMUCOSAL

## 2023-12-29 MED ORDER — PROPOFOL 10 MG/ML IV BOLUS
INTRAVENOUS | Status: DC | PRN
  Administered 2023-12-29: 100 mg via INTRAVENOUS

## 2023-12-29 SURGICAL SUPPLY — 31 items
BLADE CLIPPER SURG (BLADE) IMPLANT
BLADE SURG 15 STRL LF DISP TIS (BLADE) ×1 IMPLANT
BRIEF MESH DISP 2XL (UNDERPADS AND DIAPERS) ×1 IMPLANT
DRAPE LAPAROTOMY 100X77 ABD (DRAPES) ×1 IMPLANT
DRSG GAUZE FLUFF 36X18 (GAUZE/BANDAGES/DRESSINGS) ×1 IMPLANT
ELECT REM PT RETURN 9FT ADLT (ELECTROSURGICAL) ×1
ELECTRODE REM PT RTRN 9FT ADLT (ELECTROSURGICAL) ×1 IMPLANT
GAUZE 4X4 16PLY ~~LOC~~+RFID DBL (SPONGE) IMPLANT
GAUZE SPONGE 4X4 12PLY STRL (GAUZE/BANDAGES/DRESSINGS) ×1 IMPLANT
GLOVE BIOGEL PI IND STRL 7.5 (GLOVE) ×1 IMPLANT
GLOVE SURG SYN 7.0 (GLOVE) ×2
GLOVE SURG SYN 7.0 PF PI (GLOVE) ×1 IMPLANT
GOWN STRL REUS W/ TWL LRG LVL3 (GOWN DISPOSABLE) ×2 IMPLANT
IV CATH ANGIO 14GX3.25 ORG (MISCELLANEOUS) IMPLANT
MANIFOLD NEPTUNE II (INSTRUMENTS) ×1 IMPLANT
NDL HYPO 22X1.5 SAFETY MO (MISCELLANEOUS) ×1 IMPLANT
NEEDLE HYPO 22X1.5 SAFETY MO (MISCELLANEOUS) ×1
NS IRRIG 500ML POUR BTL (IV SOLUTION) ×1 IMPLANT
PACK BASIN MINOR ARMC (MISCELLANEOUS) ×1 IMPLANT
PAD ABD DERMACEA PRESS 5X9 (GAUZE/BANDAGES/DRESSINGS) IMPLANT
PUNCH BIOPSY 4MM (MISCELLANEOUS) ×1
PUNCH BIOPSY DISP 4 (MISCELLANEOUS) IMPLANT
SOL PREP PVP 2OZ (MISCELLANEOUS) ×1
SOLUTION PREP PVP 2OZ (MISCELLANEOUS) ×1 IMPLANT
SUT ETHILON 2 0 FS 18 (SUTURE) IMPLANT
SUT VIC AB 2-0 CT1 (SUTURE) IMPLANT
SUT VIC AB 2-0 CT1 TAPERPNT 27 (SUTURE) IMPLANT
SYR 10ML LL (SYRINGE) ×1 IMPLANT
SYR BULB IRRIG 60ML STRL (SYRINGE) ×1 IMPLANT
TRAP FLUID SMOKE EVACUATOR (MISCELLANEOUS) ×1 IMPLANT
WATER STERILE IRR 500ML POUR (IV SOLUTION) ×1 IMPLANT

## 2023-12-29 NOTE — Anesthesia Preprocedure Evaluation (Signed)
Anesthesia Evaluation  Patient identified by MRN, date of birth, ID band Patient awake    Reviewed: Allergy & Precautions, NPO status , Patient's Chart, lab work & pertinent test results  History of Anesthesia Complications Negative for: history of anesthetic complications  Airway Mallampati: II  TM Distance: >3 FB Neck ROM: full    Dental  (+) Poor Dentition   Pulmonary COPD,  COPD inhaler, Patient abstained from smoking., former smoker   Pulmonary exam normal        Cardiovascular hypertension, On Medications negative cardio ROS Normal cardiovascular exam     Neuro/Psych  PSYCHIATRIC DISORDERS  Depression    negative neurological ROS     GI/Hepatic negative GI ROS, Neg liver ROS,,,  Endo/Other  negative endocrine ROS    Renal/GU Renal disease     Musculoskeletal   Abdominal   Peds  Hematology negative hematology ROS (+)   Anesthesia Other Findings Past Medical History: 12/09/2021: Acute cholecystitis No date: Bile leak No date: Chronic kidney disease, stage 3a (HCC) No date: Chronic venous insufficiency No date: COPD (chronic obstructive pulmonary disease) (HCC) No date: Depression No date: Hep C w/o coma, chronic (HCC)     Comment:  treated No date: History of kidney stones No date: Hypertension No date: Methadone dependence (HCC) No date: Nephrolithiasis No date: Pre-diabetes No date: Sepsis secondary to UTI (HCC) No date: Thrombocytopenia (HCC)  Past Surgical History: 12/09/2021: CYSTOSCOPY W/ URETERAL STENT PLACEMENT; Right     Comment:  Procedure: CYSTOSCOPY WITH RETROGRADE PYELOGRAM/URETERAL              STENT PLACEMENT;  Surgeon: Riki Altes, MD;                Location: [facility];  Service: Urology;  Laterality:               Right; 02/11/2022: CYSTOSCOPY/URETEROSCOPY/HOLMIUM LASER/STENT PLACEMENT;  Bilateral     Comment:  Procedure: CYSTOSCOPY/URETEROSCOPY/HOLMIUM LASER/STENT                PLACEMENT & RIGHT URETERAL STENT EXCHANGE;  Surgeon:               Riki Altes, MD;  Location: [facility];  Service:               Urology;  Laterality: Bilateral; 05/19/2022: CYSTOSCOPY/URETEROSCOPY/HOLMIUM LASER/STENT PLACEMENT;  Bilateral     Comment:  Procedure: CYSTOSCOPY/URETEROSCOPY/HOLMIUM LASER/STENT               PLACEMENT;  Surgeon: Riki Altes, MD;  Location:               [facility];  Service: Urology;  Laterality: Bilateral; 12/11/2021: ENDOSCOPIC RETROGRADE CHOLANGIOPANCREATOGRAPHY (ERCP)  WITH PROPOFOL; N/A     Comment:  Procedure: ENDOSCOPIC RETROGRADE               CHOLANGIOPANCREATOGRAPHY (ERCP) WITH PROPOFOL;  Surgeon:               [name], MD;  Location: [facility] ENDOS[facility];  Service:               Endoscopy;  Laterality: N/A; 03/10/2022: ERCP; N/A     Comment:  Procedure: ENDOSCOPIC RETROGRADE               CHOLANGIOPANCREATOGRAPHY (ERCP);  Surgeon: [name],               MD;  Location: [facility] ENDOSCOPY;  Service: Endoscopy;  Laterality: N/A;  Stent removal 06/23/2022: ERCP; N/A     Comment:  Procedure: ENDOSCOPIC RETROGRADE               CHOLANGIOPANCREATOGRAPHY (ERCP);  Surgeon: [name],               MD;  Location: [facility] ENDOSCOPY;  Service: Endoscopy;                Laterality: N/A;  stent removal No date: PLACEMENT OF BREAST IMPLANTS; Bilateral No date: TONSILLECTOMY     Reproductive/Obstetrics negative OB ROS                             Anesthesia Physical Anesthesia Plan  ASA: 2  Anesthesia Plan: General ETT   Post-op Pain Management: Toradol IV (intra-op)* and Ofirmev IV (intra-op)*   Induction: Intravenous  PONV Risk Score and Plan: 3 and Ondansetron, Dexamethasone and Treatment may vary due to age or medical condition  Airway Management Planned: Oral ETT  Additional Equipment:   Intra-op Plan:   Post-operative Plan: Extubation in OR  Informed Consent: I have reviewed the  patients History and Physical, chart, labs and discussed the procedure including the risks, benefits and alternatives for the proposed anesthesia with the patient or authorized representative who has indicated his/her understanding and acceptance.     Dental Advisory Given  Plan Discussed with: Anesthesiologist, CRNA and Surgeon  Anesthesia Plan Comments: (Patient consented for risks of anesthesia including but not limited to:  - adverse reactions to medications - damage to eyes, teeth, lips or other oral mucosa - nerve damage due to positioning  - sore throat or hoarseness - Damage to heart, brain, nerves, lungs, other parts of body or loss of life  Patient voiced understanding and assent.)       Anesthesia Quick Evaluation

## 2023-12-29 NOTE — H&P (Signed)
No changes to below H and P, porceed as planned with pilonidal cyst excision. I discussed again the need for packing and letting the wound heal by secondary intention.   CC: Pilonidal Cyst Disease 12/21/2023   Kim Graham is an 73 y.o. female.        Chief Complaint  Patient presents with   Follow-up      Pilonidal cyst      HPI: Kim Graham returns today in consultation pilonidal cyst disease.  I had seen her in the past for a pilonidal cyst abscess.  She return to my office recently but at that time did not want to undergo surgical intervention for this.  She returns today because she has changed her mind.  She reports that she continues to have drainage at the area.  She denies any fevers or chills.  She does have some pain in her gluteal cleft.  She has no problems having a bowel movement and no blood in her stool.  She is a little bit nervous about surgery because she has a history of COPD but she does not require any home oxygen use.       Past Medical History:  Diagnosis Date   Acute cholecystitis 12/09/2021   Bile leak     COPD (chronic obstructive pulmonary disease) (HCC)     Hepatitis     History of kidney stones     Hypertension     Sepsis secondary to UTI [facility])                 Past Surgical History:  Procedure Laterality Date   CYSTOSCOPY W/ URETERAL STENT PLACEMENT Right 12/09/2021    Procedure: CYSTOSCOPY WITH RETROGRADE PYELOGRAM/URETERAL STENT PLACEMENT;  Surgeon: Riki Altes, MD;  Location: [facility];  Service: Urology;  Laterality: Right;   CYSTOSCOPY/URETEROSCOPY/HOLMIUM LASER/STENT PLACEMENT Bilateral 02/11/2022    Procedure: CYSTOSCOPY/URETEROSCOPY/HOLMIUM LASER/STENT PLACEMENT & RIGHT URETERAL STENT EXCHANGE;  Surgeon: Riki Altes, MD;  Location: [facility];  Service: Urology;  Laterality: Bilateral;   CYSTOSCOPY/URETEROSCOPY/HOLMIUM LASER/STENT PLACEMENT Bilateral 05/19/2022    Procedure: CYSTOSCOPY/URETEROSCOPY/HOLMIUM LASER/STENT PLACEMENT;  Surgeon:  Riki Altes, MD;  Location: [facility];  Service: Urology;  Laterality: Bilateral;   ENDOSCOPIC RETROGRADE CHOLANGIOPANCREATOGRAPHY (ERCP) WITH PROPOFOL N/A 12/11/2021    Procedure: ENDOSCOPIC RETROGRADE CHOLANGIOPANCREATOGRAPHY (ERCP) WITH PROPOFOL;  Surgeon: [name], MD;  Location: [facility] ENDOSCOPY;  Service: Endoscopy;  Laterality: N/A;   ERCP N/A 03/10/2022    Procedure: ENDOSCOPIC RETROGRADE CHOLANGIOPANCREATOGRAPHY (ERCP);  Surgeon: [name], MD;  Location: [facility] ENDOSCOPY;  Service: Endoscopy;  Laterality: N/A;  Stent removal   ERCP N/A 06/23/2022    Procedure: ENDOSCOPIC RETROGRADE CHOLANGIOPANCREATOGRAPHY (ERCP);  Surgeon: [name], MD;  Location: [facility] ENDOSCOPY;  Service: Endoscopy;  Laterality: N/A;  stent removal               Family History  Problem Relation Age of Onset   Diabetes Mother     Cancer Father          Pancreatic   Diabetes Maternal Aunt     Diabetes Maternal Grandmother     Dementia Maternal Grandfather     Heart disease Maternal Grandfather            Social History:  reports that she quit smoking about 5 years ago. Her smoking use included cigarettes. She started smoking about 54 years ago. She has a 24.5 pack-year smoking history. She has been exposed to tobacco smoke. She has never used smokeless tobacco. She  reports that she does not currently use alcohol. She reports that she does not use drugs.   Allergies:  Allergies       Allergies  Allergen Reactions   Doxycycline Diarrhea      Very bad diarrhea        Medications reviewed.       ROS Full ROS performed and is otherwise negative other than what is stated in HPI     BP 123/76   Pulse 92   Temp 98.5 F (36.9 C) (Oral)   Ht 5\' 4"  (1.626 m)   Wt 143 lb 6.4 oz (65 kg)   SpO2 95%   BMI 24.61 kg/m    Physical Exam   Alert and oriented x 3, good auscultation bilaterally, regular rate and rhythm, abdomen soft, nontender nondistended Gluteal cleft exam performed in the  presence of a chaperone.  At the top of her gluteal cleft there is 1 pit that is the source of some purulent drainage.  She has another pit that is quite a bit lower down.  This does not seem to have any drainage from it.  There is no surrounding cellulitis and there is no areas of fluctuance to suggest undrained abscess.     Lab Results Last 48 Hours  No results found for this or any previous visit (from the past 48 hours).   Imaging Results (Last 48 hours)  No results found.     Assessment/Plan: The patient is a 73 year old who has pilonidal cyst disease.  She is draining today and has no undrained fluid collections.  She is ready to have surgery for this.  I discussed with her given that she is continue to drain from this that I do not think a minimally invasive approach would work and that I would need to perform an extensive wide local excision of the pilonidal cyst area.  I discussed the risk, benefits alternatives of the procedure including risk of infection, bleeding and recurrence of the disease.  I also discussed with her that she will need someone to pack the area after surgery.  She says that her boyfriend can do that.  We will schedule her for 17 February.     Baker Pierini, M.D. [facility]

## 2023-12-29 NOTE — Anesthesia Procedure Notes (Signed)
Procedure Name: Intubation Date/Time: 12/29/2023 7:40 AM  Performed by: Lysbeth Penner, CRNAPre-anesthesia Checklist: Patient identified, Emergency Drugs available, Suction available and Patient being monitored Patient Re-evaluated:Patient Re-evaluated prior to induction Oxygen Delivery Method: Circle system utilized Preoxygenation: Pre-oxygenation with 100% oxygen Induction Type: IV induction Ventilation: Mask ventilation without difficulty Laryngoscope Size: McGrath and 3 Grade View: Grade II Tube type: Oral Tube size: 6.5 mm Number of attempts: 1 Airway Equipment and Method: Stylet and Oral airway Placement Confirmation: ETT inserted through vocal cords under direct vision, positive ETCO2 and breath sounds checked- equal and bilateral Secured at: 19 cm Tube secured with: Tape Dental Injury: Teeth and Oropharynx as per pre-operative assessment

## 2023-12-29 NOTE — Anesthesia Postprocedure Evaluation (Signed)
Anesthesia Post Note  Patient: Kim Graham  Procedure(s) Performed: CYST EXCISION PILONIDAL EXTENSIVE  Patient location during evaluation: PACU Anesthesia Type: General Level of consciousness: awake and alert Pain management: pain level controlled Vital Signs Assessment: post-procedure vital signs reviewed and stable Respiratory status: spontaneous breathing, nonlabored ventilation, respiratory function stable and patient connected to nasal cannula oxygen Cardiovascular status: blood pressure returned to baseline and stable Postop Assessment: no apparent nausea or vomiting Anesthetic complications: no   There were no known notable events for this encounter.   Last Vitals:  Vitals:   12/29/23 0900 12/29/23 0922  BP: 139/69 (!) 125/95  Pulse: 98 (!) 105  Resp: 18 20  Temp: 36.4 C (!) 36.1 C  SpO2: 95% 96%    Last Pain:  Vitals:   12/29/23 0922  TempSrc: Temporal  PainSc: 0-No pain                 Louie Boston

## 2023-12-29 NOTE — Transfer of Care (Signed)
Immediate Anesthesia Transfer of Care Note  Patient: Kim Graham  Procedure(s) Performed: CYST EXCISION PILONIDAL EXTENSIVE  Patient Location: PACU  Anesthesia Type:General  Level of Consciousness: awake and drowsy  Airway & Oxygen Therapy: Patient Spontanous Breathing  Post-op Assessment: Report given to RN and Post -op Vital signs reviewed and stable  Post vital signs: Reviewed and stable  Last Vitals:  Vitals Value Taken Time  BP 133/53 12/29/23 0827  Temp    Pulse 105 12/29/23 0829  Resp 18 12/29/23 0829  SpO2 97 % 12/29/23 0829  Vitals shown include unfiled device data.  Last Pain:  Vitals:   12/29/23 0628  TempSrc: Temporal  PainSc: 6          Complications: There were no known notable events for this encounter.

## 2023-12-29 NOTE — Op Note (Signed)
Operative Note  Preoperative diagnosis pilonidal cyst Postoperative diagnosis: Pilonidal cyst Surgeon: Baker Pierini, MD Procedure: Extensive excision of pilonidal cyst disease EBL: 20 cc  After informed consent was obtained the patient was brought to the operating room and intubated on the hospital gurney.  She was then placed in the prone position.  All pressure points were padded appropriately.  The gluteal cleft was then prepped and draped with the usual sterile fashion.  A surgical timeout was called identifying correct patient, site, side and procedure.  An elliptical incision was made encompassing abscess cavity and 2 other pits of her pilonidal disease.  This was taken down through the subcutaneous tissue.  The pilonidal cyst was quite deep and there is fibrinous tissue just anterior to the muscle fascia.  This was taken off of the muscle fascia and excised fully.  Hemostasis was then obtained with Bovie cautery.  The wound was then irrigated with warm saline solution.  The specimen was then passed off and sent to pathology.  The wound was then packed with saline soaked Kerlix and dressed with ABD pads.  Prior to termination of the procedure all sponge instrument counts were correct x 2.  The patient was then flipped supine and extubated on the operating room table and taken to the PACU in good condition.

## 2023-12-30 ENCOUNTER — Encounter: Payer: Self-pay | Admitting: General Surgery

## 2023-12-30 LAB — SURGICAL PATHOLOGY

## 2024-01-13 ENCOUNTER — Ambulatory Visit: Payer: 59 | Admitting: General Surgery

## 2024-01-13 ENCOUNTER — Encounter: Payer: Self-pay | Admitting: General Surgery

## 2024-01-13 VITALS — BP 120/74 | HR 111 | Temp 98.3°F | Ht 64.0 in | Wt 134.0 lb

## 2024-01-13 DIAGNOSIS — Z09 Encounter for follow-up examination after completed treatment for conditions other than malignant neoplasm: Secondary | ICD-10-CM

## 2024-01-13 DIAGNOSIS — L0501 Pilonidal cyst with abscess: Secondary | ICD-10-CM

## 2024-01-13 MED ORDER — OXYCODONE HCL 5 MG PO TABS: 5.0000 mg | ORAL_TABLET | Freq: Three times a day (TID) | ORAL | 0 refills | Status: DC | PRN

## 2024-01-13 NOTE — Patient Instructions (Signed)
Pilonidal Cyst Removal, Care After The following information offers guidance on how to care for yourself after your procedure. Your health care provider may also give you more specific instructions. If you have problems or questions, contact your health care provider. What can I expect after the procedure? After the procedure, it is common to have: Pain. Redness. Some swelling. Some fluid or blood coming from your incision (drainage). You may have more drainage if you have an open incision. Follow these instructions at home: Medicines Take over-the-counter and prescription medicines only as told by your health care provider. If you were prescribed antibiotics, take them as told by your health care provider. Do not stop using the antibiotic even if you start to feel better. Ask your health care provider if the medicine prescribed to you: Requires you to avoid driving or using machinery. Can cause constipation. You may need to take these actions to prevent or treat constipation: Drink enough fluid to keep your urine pale yellow. Take over-the-counter or prescription medicines. Eat foods that are high in fiber, such as beans, whole grains, and fresh fruits and vegetables. Limit foods that are high in fat and processed sugars, such as fried or sweet foods. Incision care  You may need to have a caregiver help you with wound care and dressing changes. Follow instructions from your health care provider about how to take care of your incision. Make sure you: Wash your hands with soap and water for at least 20 seconds before and after you change your bandage (dressing). If soap and water are not available, use hand sanitizer. Change your dressing as told by your health care provider. Leave stitches (sutures), skin glue, or adhesive strips in place. These skin closures may need to stay in place for 2 weeks or longer. If adhesive strip edges start to loosen and curl up, you may trim the loose edges. Do  not remove adhesive strips completely unless your health care provider tells you to do that. Check your incision area every day for signs of infection. If it is hard to see the area, have someone check for you. Check for: More redness, swelling, or pain. More fluid or blood. Warmth. Pus or a bad smell. Managing pain and swelling If directed, put ice on the affected area. To do this: Put ice in a plastic bag. Place a towel between your skin and the bag. Leave the ice on for 20 minutes, 2-3 times a day. If your skin turns bright red, remove the ice right away to prevent skin damage. The risk of skin damage is higher if you cannot feel pain, heat, or cold. Activity Do not do activities that cause pain or irritate the incision area. These may include bike riding, running, sit ups, or anything that involves a twisting motion. Rest as told by your health care provider. Do not sit for a long time without moving. Get up to take short walks every 1-2 hours. This will improve blood flow and breathing. Ask for help if you feel weak or unsteady. Return to your normal activities as told by your health care provider. Ask your health care provider what activities are safe for you. General instructions Do not use any products that contain nicotine or tobacco. These products include cigarettes, chewing tobacco, and vaping devices, such as e-cigarettes. These can delay healing after surgery. If you need help quitting, ask your health care provider. Do not take baths, swim, or use a hot tub until your health care provider approves.   Ask your health care provider if you may take showers. You may only be allowed to take sponge baths. Keep all follow-up visits. This is important to monitor healing. If you had a procedure with wound packing, your packing may be changed or removed at follow-up visits. Contact a health care provider if: You have pain that does not get better with medicine. You have any of these signs  of infection: More redness, swelling, or pain around your incision. More fluid or blood coming from your incision. Warmth coming from your incision. Pus or a bad smell coming from your incision. A fever. Get help right away if: You have severe pain in your abdomen. You have sudden chest pain and shortness of breath. You cough up blood. You faint or lose consciousness. These symptoms may be an emergency. Get help right away. Call 911. Do not wait to see if the symptoms will go away. Do not drive yourself to the hospital. Summary It is common to have some pain and drainage after your procedure. You may have more drainage if you have an open incision. You may need to have a caregiver help you with wound care and dressing changes. Do not do activities that cause pain or irritate the incision area. Contact your health care provider if you have pain that does not get better with medicine or if you have any signs of infection. This information is not intended to replace advice given to you by your health care provider. Make sure you discuss any questions you have with your health care provider. Document Revised: 01/30/2022 Document Reviewed: 01/30/2022 Elsevier Patient Education  2024 [employer] Inc[employer]  

## 2024-01-14 NOTE — Progress Notes (Signed)
 Outpatient Surgical Follow Up CC: Pilonidal Cyst Disease 01/14/2024  Kim Graham is an 73 y.o. female.   Chief Complaint  Patient presents with   Routine Post Op    Pilonidal cyst    HPI: The patient returns today status post excision of pilonidal cyst disease.  She reports doing okay.  She says she is having significant amount of pain when she packed the wound.  It still continues to drain a little bit.  She is taking Tylenol and ibuprofen as needed for pain.  She denies any bleeding or purulence from the wound although does say there is slight yellowish tinge to the drainage.  She denies any fevers or chills.  She is having normal bowel movements.  Past Medical History:  Diagnosis Date   Acute cholecystitis 12/09/2021   Bile leak    Chronic kidney disease, stage 3a (HCC)    Chronic venous insufficiency    COPD (chronic obstructive pulmonary disease) (HCC)    Depression    Hep C w/o coma, chronic (HCC)    treated   History of kidney stones    Hypertension    Methadone dependence (HCC)    Nephrolithiasis    Pre-diabetes    Sepsis secondary to UTI (HCC)    Thrombocytopenia (HCC)     Past Surgical History:  Procedure Laterality Date   CYSTOSCOPY W/ URETERAL STENT PLACEMENT Right 12/09/2021   Procedure: CYSTOSCOPY WITH RETROGRADE PYELOGRAM/URETERAL STENT PLACEMENT;  Surgeon: Riki Altes, MD;  Location: [facility] ORS;  Service: Urology;  Laterality: Right;   CYSTOSCOPY/URETEROSCOPY/HOLMIUM LASER/STENT PLACEMENT Bilateral 02/11/2022   Procedure: CYSTOSCOPY/URETEROSCOPY/HOLMIUM LASER/STENT PLACEMENT & RIGHT URETERAL STENT EXCHANGE;  Surgeon: Riki Altes, MD;  Location: [facility];  Service: Urology;  Laterality: Bilateral;   CYSTOSCOPY/URETEROSCOPY/HOLMIUM LASER/STENT PLACEMENT Bilateral 05/19/2022   Procedure: CYSTOSCOPY/URETEROSCOPY/HOLMIUM LASER/STENT PLACEMENT;  Surgeon: Riki Altes, MD;  Location: [facility] ORS;  Service: Urology;  Laterality: Bilateral;   ENDOSCOPIC  RETROGRADE CHOLANGIOPANCREATOGRAPHY (ERCP) WITH PROPOFOL N/A 12/11/2021   Procedure: ENDOSCOPIC RETROGRADE CHOLANGIOPANCREATOGRAPHY (ERCP) WITH PROPOFOL;  Surgeon: [name], MD;  Location: [facility] ENDOSCOPY;  Service: Endoscopy;  Laterality: N/A;   ERCP N/A 03/10/2022   Procedure: ENDOSCOPIC RETROGRADE CHOLANGIOPANCREATOGRAPHY (ERCP);  Surgeon: [name], MD;  Location: [facility] ENDOSCOPY;  Service: Endoscopy;  Laterality: N/A;  Stent removal   ERCP N/A 06/23/2022   Procedure: ENDOSCOPIC RETROGRADE CHOLANGIOPANCREATOGRAPHY (ERCP);  Surgeon: [name], MD;  Location: [facility] ENDOSCOPY;  Service: Endoscopy;  Laterality: N/A;  stent removal   PILONIDAL CYST EXCISION N/A 12/29/2023   Procedure: CYST EXCISION PILONIDAL EXTENSIVE;  Surgeon: Kandis Cocking, MD;  Location: [facility] ORS;  Service: General;  Laterality: N/A;   PLACEMENT OF BREAST IMPLANTS Bilateral    TONSILLECTOMY      Family History  Problem Relation Age of Onset   Diabetes Mother    Cancer Father        Pancreatic   Diabetes Maternal Aunt    Diabetes Maternal Grandmother    Dementia Maternal Grandfather    Heart disease Maternal Grandfather     Social History:  reports that she quit smoking about 5 years ago. Her smoking use included cigarettes. She started smoking about 54 years ago. She has a 24.5 pack-year smoking history. She has been exposed to tobacco smoke. She has never used smokeless tobacco. She reports that she does not currently use alcohol. She reports that she does not currently use drugs after having used the following drugs: Heroin.  Allergies:  Allergies  Allergen Reactions  Doxycycline Diarrhea    Very bad diarrhea    Medications reviewed.    ROS Full ROS performed and is otherwise negative other than what is stated in HPI   BP 120/74   Pulse (!) 111   Temp 98.3 F (36.8 C) (Oral)   Ht 5\' 4"  (1.626 m)   Wt 134 lb (60.8 kg)   SpO2 94%   BMI 23.00 kg/m   Physical Exam Gluteal cleft exam  performed in the presence of a chaperone.  The excision site has some granulation tissue at the inferior edge with some bleeding.  The deep part of it does have some fibrinous exudate to it but there is no signs of overt infection.    No results found for this or any previous visit (from the past 48 hours). No results found.  Assessment/Plan: Patient is status post pilonidal cyst excision.  She is still having some pain as the wound is quite extensive.  There is some fibrinous exudate.  She has been packing the wound.  We will place Aquacel Ag at the wound base and cover this with gauze.  We gave her a sample of Aquacel Ag and this can be changed every 2 to 3 days.  She will return in 2 weeks for another wound check  Baker Pierini, M.D. [facility]

## 2024-01-18 IMAGING — US US ABDOMEN LIMITED
1 series · 14 of 25 positions shown · non-contrast
Comparison: 02/08/2022 unenhanced CT abdomen/pelvis

CLINICAL DATA: Elevated liver function tests. Cholecystectomy. Bile
duct stent placed December 2021. Hepatitis-C.

EXAM:
ULTRASOUND ABDOMEN LIMITED RIGHT UPPER QUADRANT

[Series 1: us abdomen limited ruq (liver/gb) · 14 of 34 slices shown]
[im 1/34]
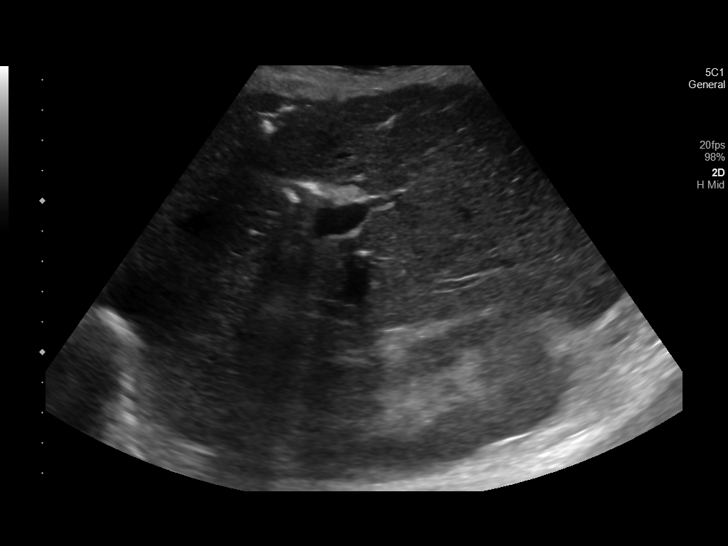
[im 3/34]
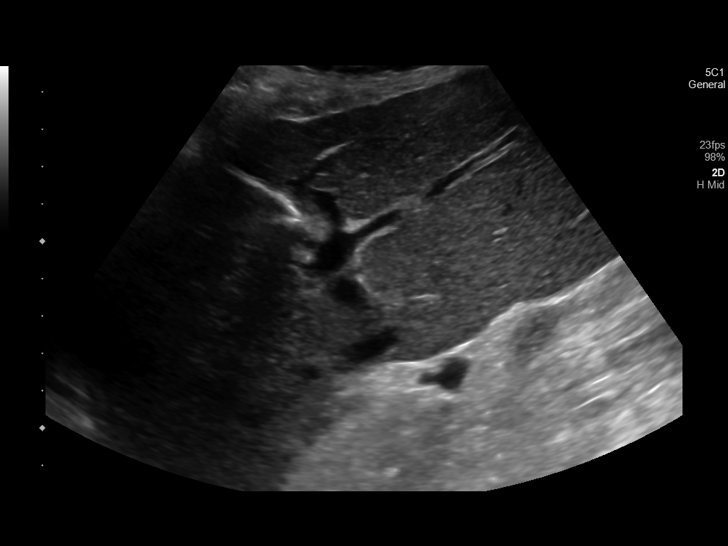
[im 6/34]
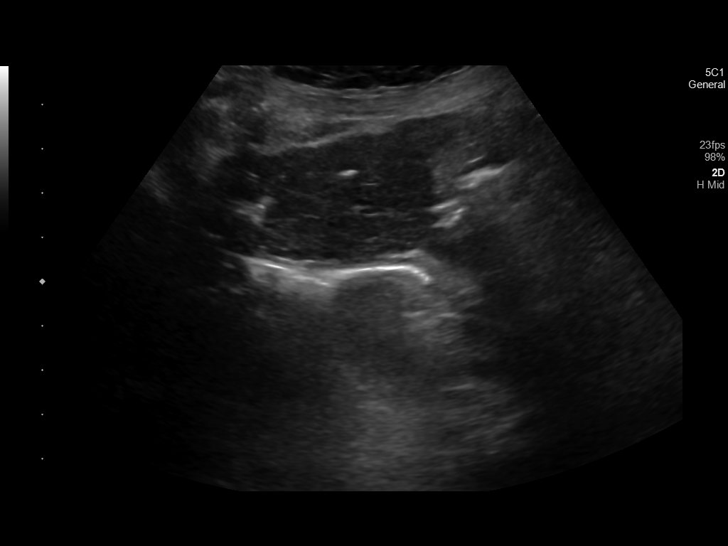
[im 9/34]
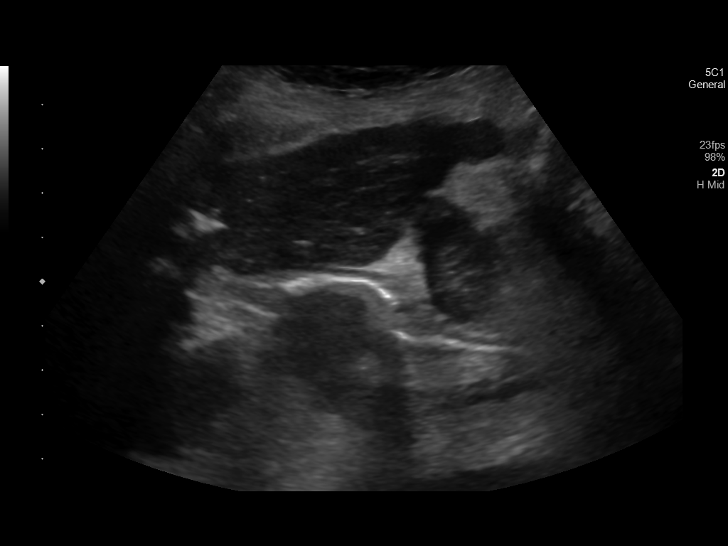
[im 12/34]
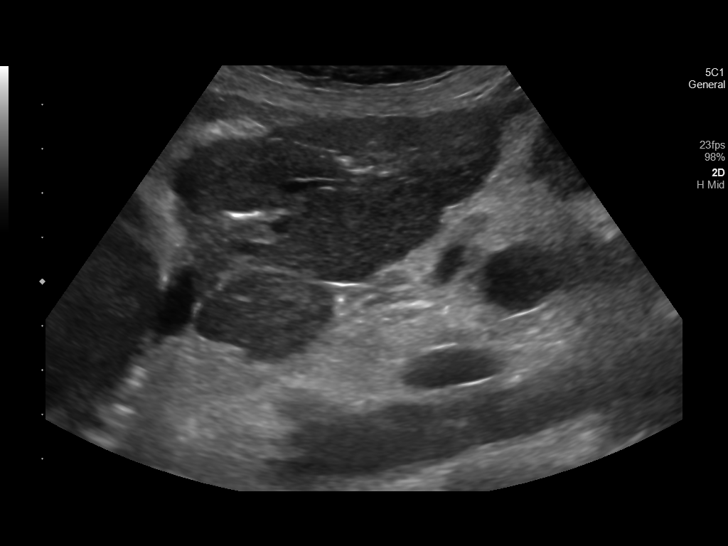
[im 13/34]
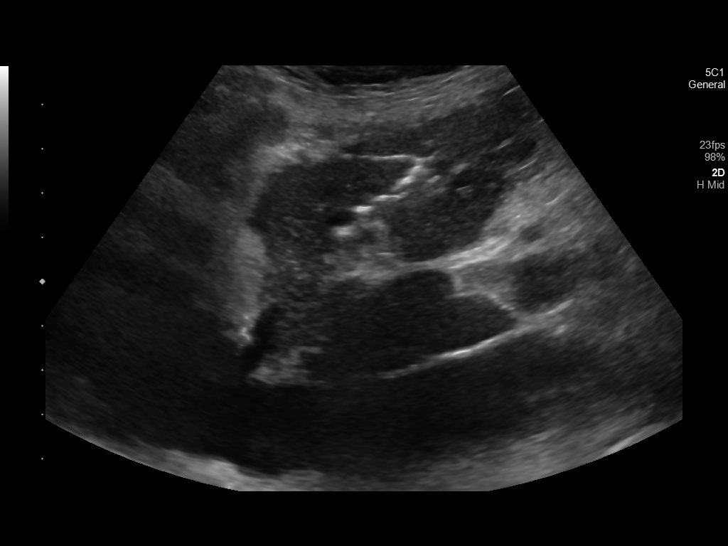
[im 16/34]
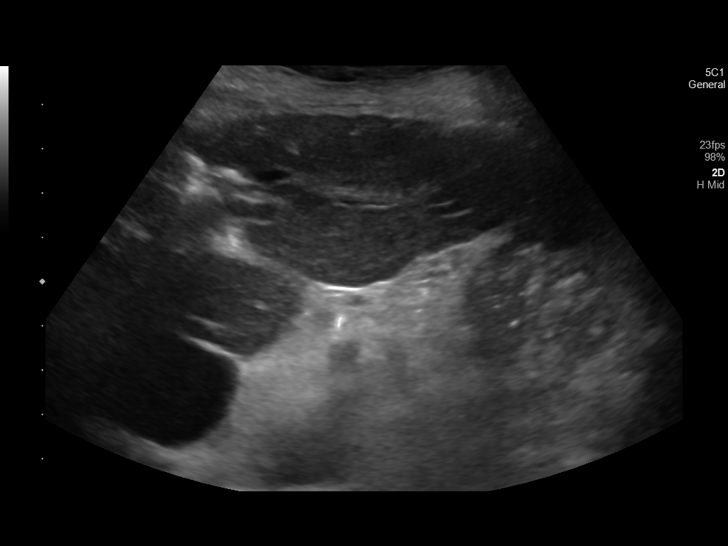
[im 18/34]
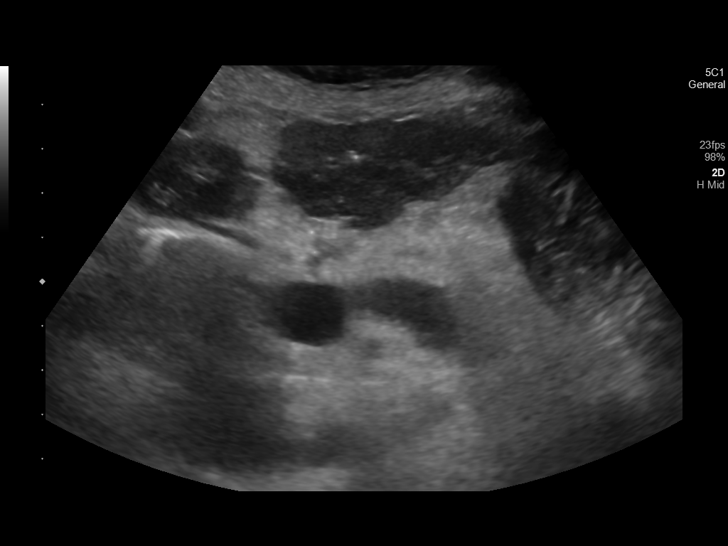
[im 21/34]
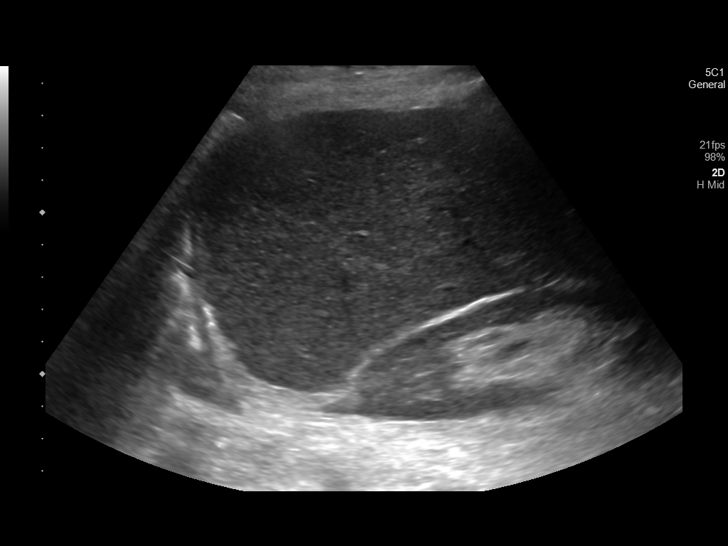
[im 23/34]
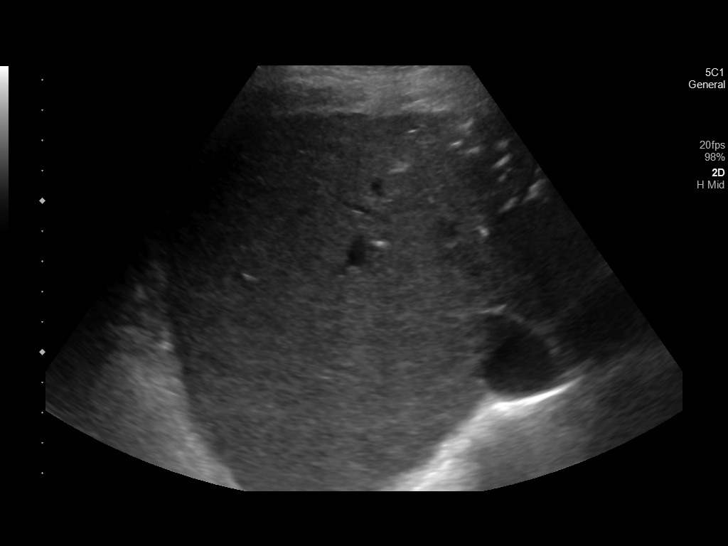
[im 25/34]
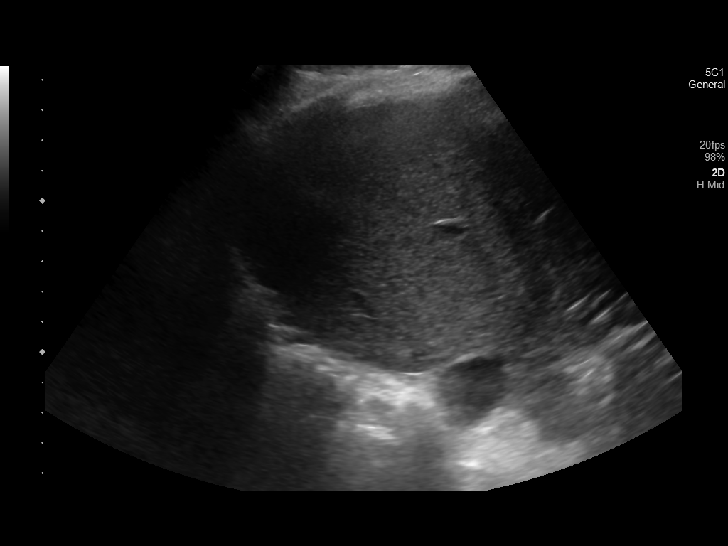
[im 28/34]
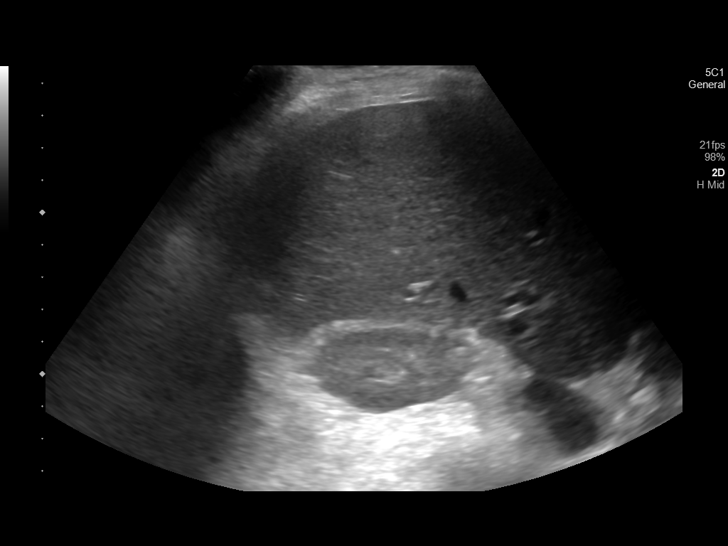
[im 31/34]
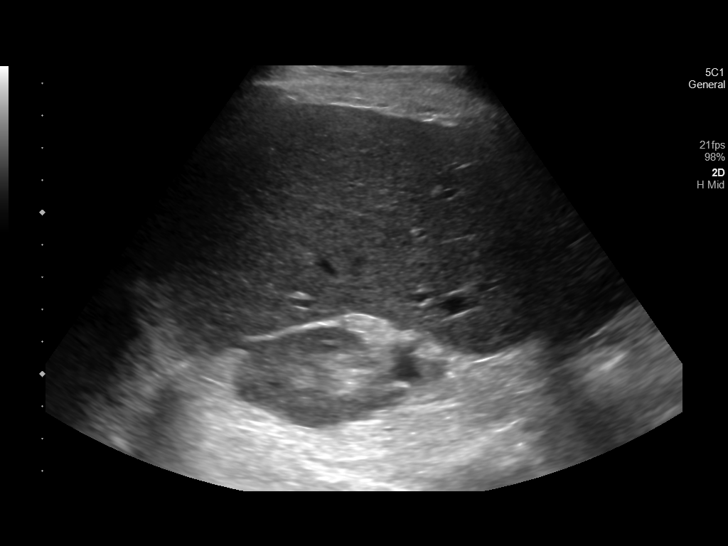
[im 34/34]
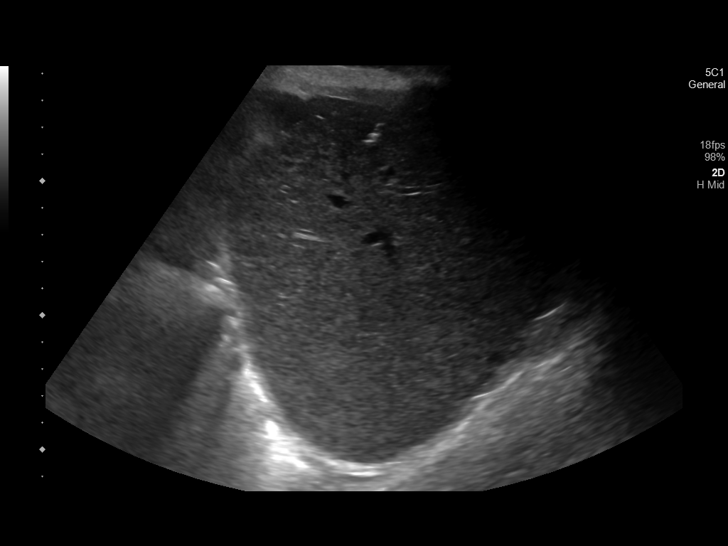

[14 of 25 positions shown; findings below may reference images not displayed]

FINDINGS: Gallbladder:

Surgically absent.

Common bile duct:

Diameter: 5 mm.  Stent visualized within the common bile duct.

Liver:

Diffusely coarsened liver parenchymal echotexture with mildly
irregular liver surface, compatible with cirrhosis. No liver masses.
Portal vein is patent on color Doppler imaging with normal direction
of blood flow towards the liver.

Other: None.
IMPRESSION: 1. Cirrhosis.  No liver masses.
2. Status post cholecystectomy. Stent visualized within normal
caliber common bile duct.

## 2024-01-21 IMAGING — US US RENAL
1 series · 15 of 25 positions shown · non-contrast
Comparison: 02/10/2022

CLINICAL DATA: 71-year-old female with a history of hydronephrosis.
History of bilateral ureteral stent

EXAM:
RENAL / URINARY TRACT ULTRASOUND COMPLETE

[Series 1: us renal · 15 of 56 slices shown]
[im 1/56]
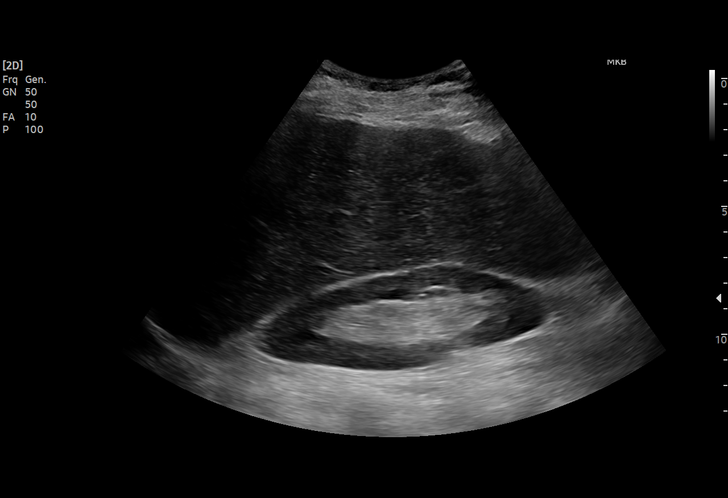
[im 5/56]
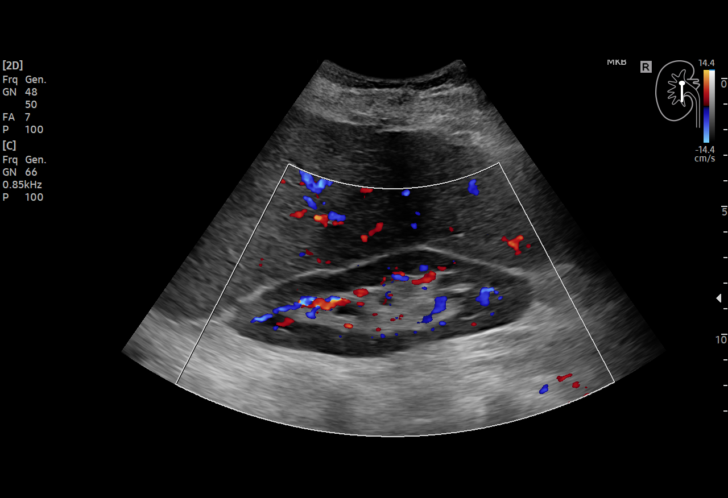
[im 10/56]
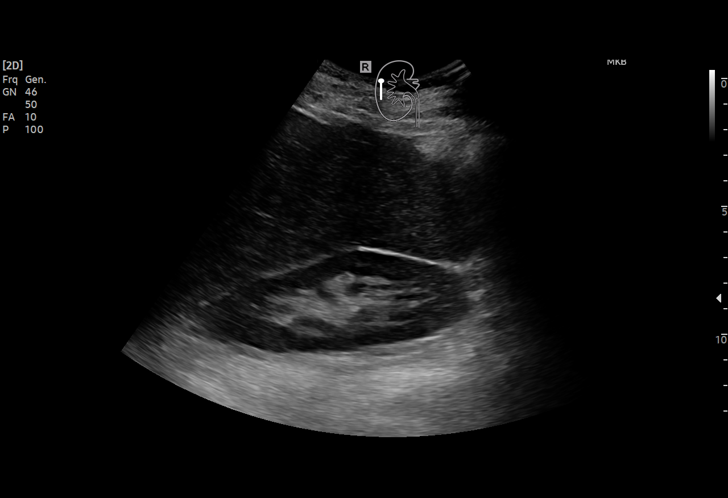
[im 12/56]
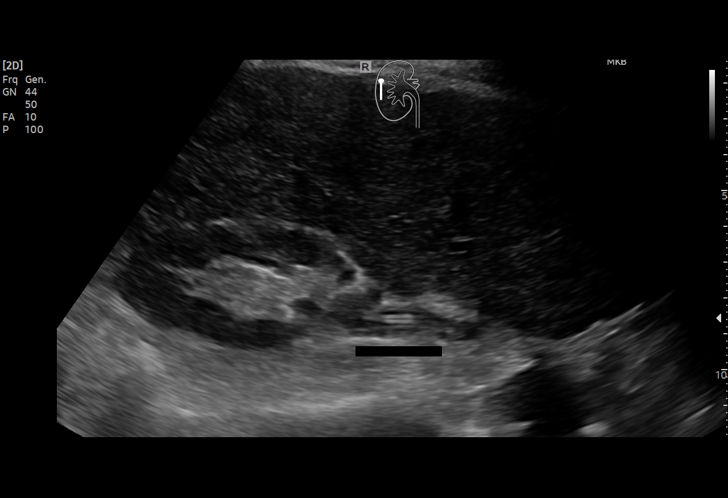
[im 17/56]
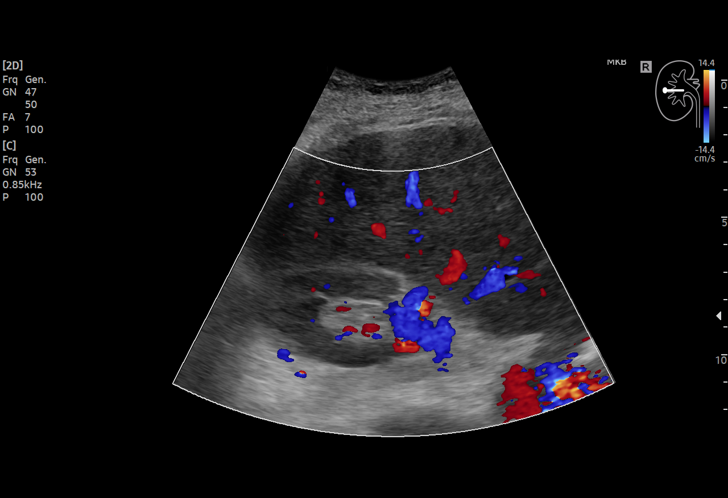
[im 21/56]
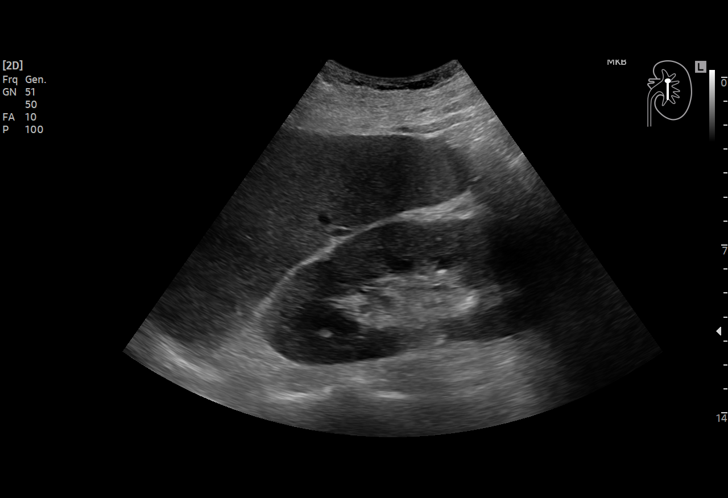
[im 23/56]
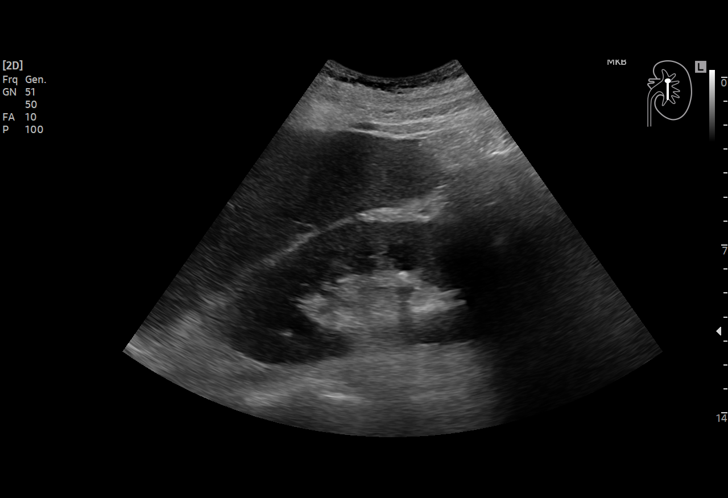
[im 28/56]
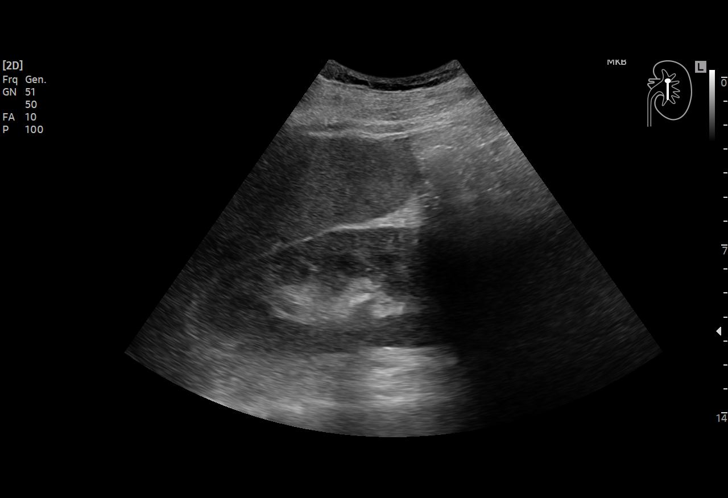
[im 33/56]
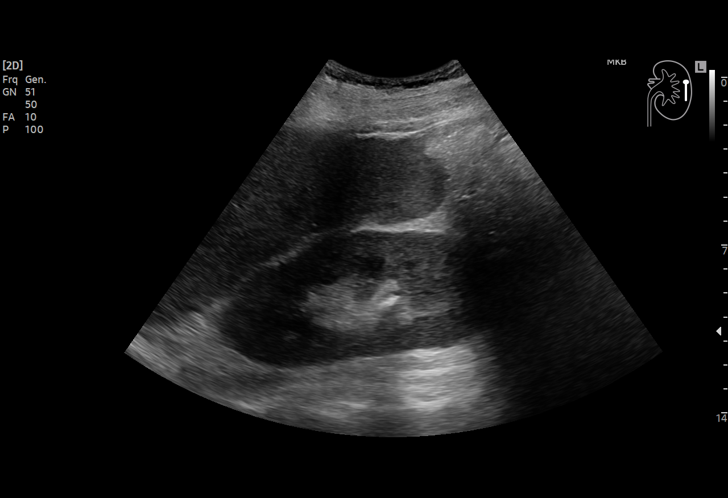
[im 35/56]
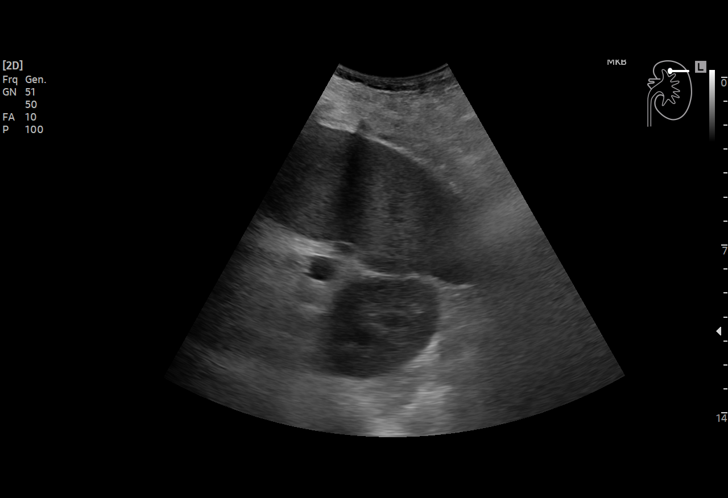
[im 39/56]
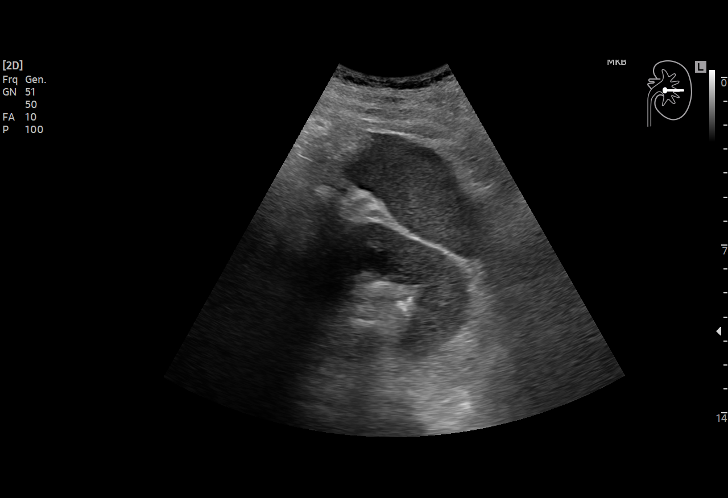
[im 44/56]
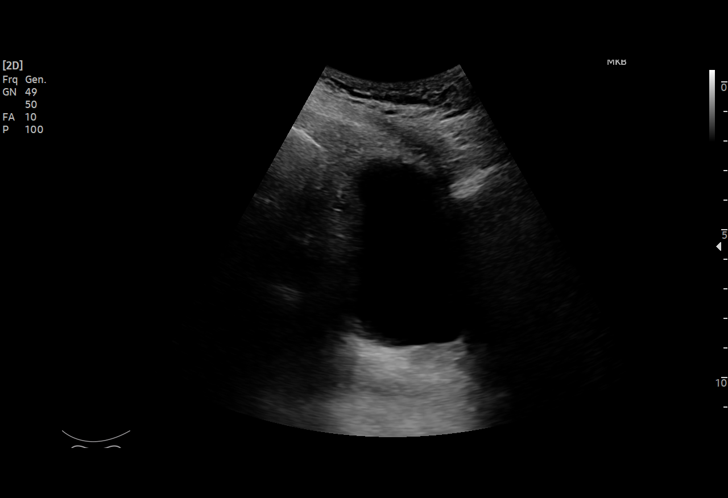
[im 46/56]
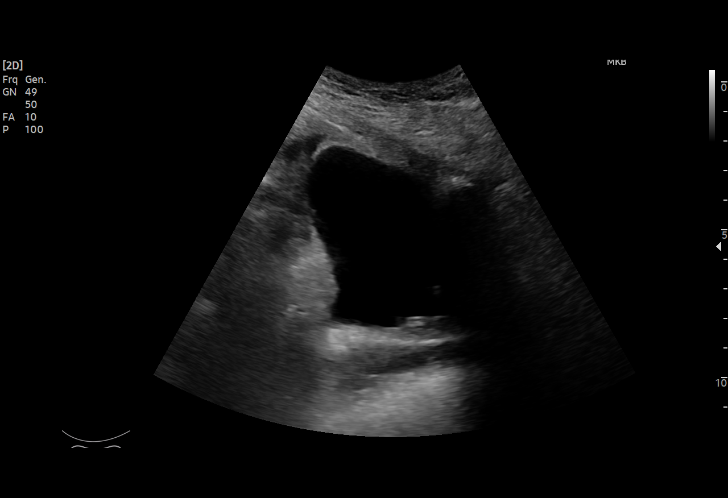
[im 51/56]
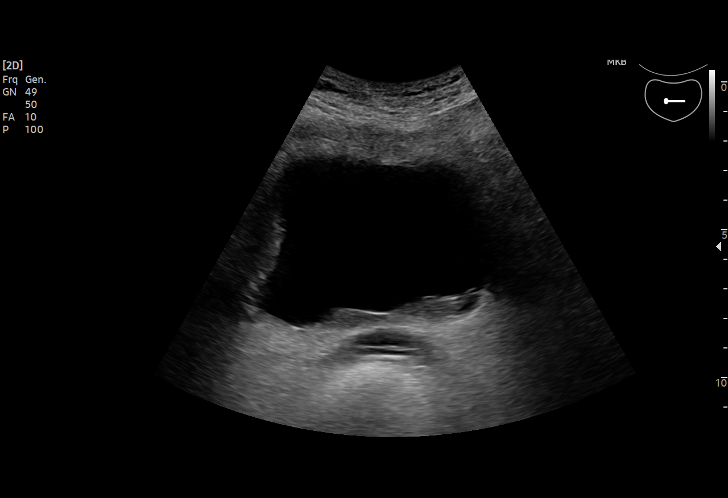
[im 56/56]
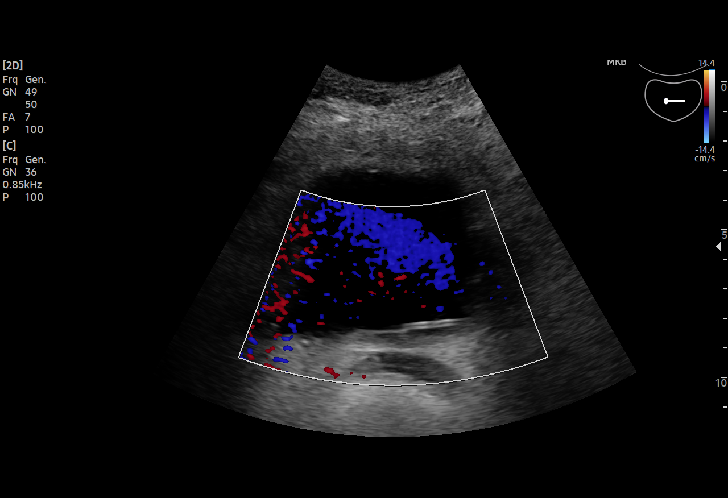

[15 of 25 positions shown; findings below may reference images not displayed]

FINDINGS: Right Kidney:

Length: 11.6 cm x 3.8 cm x 4.9 cm, 112 cc. No hydronephrosis.
Unremarkable echogenicity of the renal cortex, similar to the
adjacent liver. Flow confirmed in the hilum. Stent within the
collecting system.

Left Kidney:

Length: 12.3 cm x 5.4 cm x 5.7 cm, 196 cc. No hydronephrosis.
Symmetric echogenicity to the right kidney in the adjacent spleen.
Multiple hyperechoic foci within the left collecting system, ranging
from 6 mm to 1.4 cm.

Bladder:

Bilateral ureteral stents.
IMPRESSION: Negative for hydronephrosis.

Bilateral ureteral stents visualized in the bladder

Multiple left-sided renal calculi

## 2024-01-24 ENCOUNTER — Ambulatory Visit (INDEPENDENT_AMBULATORY_CARE_PROVIDER_SITE_OTHER): Admitting: Nurse Practitioner

## 2024-01-24 ENCOUNTER — Ambulatory Visit: Payer: Self-pay | Admitting: Family Medicine

## 2024-01-24 ENCOUNTER — Encounter: Payer: Self-pay | Admitting: Nurse Practitioner

## 2024-01-24 VITALS — BP 135/70 | HR 81 | Temp 98.6°F | Ht 64.0 in | Wt 143.0 lb

## 2024-01-24 DIAGNOSIS — L03116 Cellulitis of left lower limb: Secondary | ICD-10-CM | POA: Diagnosis not present

## 2024-01-24 DIAGNOSIS — L03115 Cellulitis of right lower limb: Secondary | ICD-10-CM | POA: Diagnosis not present

## 2024-01-24 MED ORDER — MUPIROCIN 2 % EX OINT: 1.0000 | TOPICAL_OINTMENT | Freq: Two times a day (BID) | CUTANEOUS | 0 refills | Status: DC

## 2024-01-24 MED ORDER — PREDNISONE 20 MG PO TABS: 40.0000 mg | ORAL_TABLET | Freq: Every day | ORAL | 0 refills | Status: AC

## 2024-01-24 MED ORDER — CEPHALEXIN 500 MG PO CAPS: 500.0000 mg | ORAL_CAPSULE | Freq: Three times a day (TID) | ORAL | 0 refills | Status: AC

## 2024-01-24 NOTE — Progress Notes (Signed)
 BP 135/70   Pulse 81   Temp 98.6 F (37 C) (Oral)   Ht 5\' 4"  (1.626 m)   Wt 143 lb (64.9 kg)   SpO2 90%   BMI 24.55 kg/m    Subjective:    Patient ID: Kim Graham, female    DOB: 28-Nov-1950, 73 y.o.   MRN: [id number]  HPI: Kim Graham is a 73 y.o. female  Chief Complaint  Patient presents with   Leg Swelling    Patient states she has been having bilateral leg swelling and redness for the last week. States that are occasionally painful as well.    LEG SWELLING She presents today for leg swelling and redness on and off for a month.  Has not had similar episodes in the past.  Tries not to eat a lot of salty food at home.  No alcohol use and no smoking at home.  Has places on legs where cats have scratched her, 3 inside cats and 1 outside cat.  Denies any pain to legs.  Has underlying chronic venous insufficiency. Recurrent headaches: no Visual changes: no Palpitations: no Dyspnea: no Chest pain: no Lower extremity edema: yes Dizzy/lightheaded: no   Relevant past medical, surgical, family and social history reviewed and updated as indicated. Interim medical history since our last visit reviewed. Allergies and medications reviewed and updated.  Review of Systems  Constitutional:  Negative for activity change, appetite change, diaphoresis, fatigue and fever.  Respiratory:  Negative for cough, chest tightness, shortness of breath and wheezing.   Cardiovascular:  Positive for leg swelling. Negative for chest pain and palpitations.  Gastrointestinal: Negative.   Neurological: Negative.   Psychiatric/Behavioral: Negative.      Per HPI unless specifically indicated above     Objective:    BP 135/70   Pulse 81   Temp 98.6 F (37 C) (Oral)   Ht 5\' 4"  (1.626 m)   Wt 143 lb (64.9 kg)   SpO2 90%   BMI 24.55 kg/m   Wt Readings from Last 3 Encounters:  01/24/24 143 lb (64.9 kg)  01/13/24 134 lb (60.8 kg)  12/28/23 140 lb (63.5 kg)    Physical Exam Vitals and  nursing note reviewed.  Constitutional:      General: She is awake. She is not in acute distress.    Appearance: She is well-developed and well-groomed. She is not ill-appearing or toxic-appearing.  HENT:     Head: Normocephalic.     Right Ear: Hearing and external ear normal.     Left Ear: Hearing and external ear normal.  Eyes:     General: Lids are normal.        Right eye: No discharge.        Left eye: No discharge.     Conjunctiva/sclera: Conjunctivae normal.     Pupils: Pupils are equal, round, and reactive to light.  Neck:     Thyroid: No thyromegaly.     Vascular: No carotid bruit.  Cardiovascular:     Rate and Rhythm: Normal rate and regular rhythm.     Pulses:          Dorsalis pedis pulses are 2+ on the right side and 2+ on the left side.       Posterior tibial pulses are 2+ on the right side and 2+ on the left side.     Heart sounds: Normal heart sounds. No murmur heard.    No gallop.     Comments: Negative  Homan's to BLE. Pulmonary:     Effort: Pulmonary effort is normal. No accessory muscle usage or respiratory distress.     Breath sounds: Normal breath sounds.  Abdominal:     General: Bowel sounds are normal. There is no distension.     Palpations: Abdomen is soft.     Tenderness: There is no abdominal tenderness.  Musculoskeletal:     Cervical back: Normal range of motion and neck supple.     Right lower leg: 1+ Edema present.     Left lower leg: 1+ Edema present.  Lymphadenopathy:     Cervical: No cervical adenopathy.  Skin:    General: Skin is warm and dry.       Neurological:     Mental Status: She is alert and oriented to person, place, and time.     Deep Tendon Reflexes: Reflexes are normal and symmetric.     Reflex Scores:      Brachioradialis reflexes are 2+ on the right side and 2+ on the left side.      Patellar reflexes are 2+ on the right side and 2+ on the left side. Psychiatric:        Attention and Perception: Attention normal.         Mood and Affect: Mood normal.        Speech: Speech normal.        Behavior: Behavior normal. Behavior is cooperative.        Thought Content: Thought content normal.     Results for orders placed or performed during the hospital encounter of 12/29/23  Surgical pathology   Collection Time: 12/29/23 12:00 AM  Result Value Ref Range   SURGICAL PATHOLOGY      SURGICAL PATHOLOGY [facility] [address], [address] [place], [place] [address] Telephone [phone] or [phone] Fax [phone]  REPORT OF SURGICAL PATHOLOGY   Accession #: [id number] Patient Name: Kim, Graham Visit # : [id number]  MRN: [id number] Physician: Baker Pierini DOB/Age 09-03-1951 (Age: 65) Gender: F Collected Date: 12/29/2023 Received Date: 12/29/2023  FINAL DIAGNOSIS       1. Pilonidal cyst/sinus,  :       PILONIDAL CYST.      NEGATIVE FOR MALIGNANCY.       DATE SIGNED OUT: 12/30/2023 ELECTRONIC SIGNATURE : Lance Coon Md, Pathologist, Electronic Signature  MICROSCOPIC DESCRIPTION  CASE COMMENTS STAINS USED IN DIAGNOSIS: H&E    CLINICAL HISTORY  SPECIMEN(S) OBTAINED 1. Pilonidal cyst/sinus,  SPECIMEN COMMENTS: SPECIMEN CLINICAL INFORMATION: 1. Pilonidal cyst    Gross Description 1. "Pilonidal cyst", received fresh and placed in formalin is a 5.8 x 3.5 1.6 cm aggregate of ragged soft tissue f ragments. The largest fragment is partially surfaced by a tan-gray skin ellipse with central defects and scarring. The cut surface ranges from yellow-red and softened to gray and densely fibrous. Scattered areas of sebaceous material are contained within the cut surface; no hair is identified. Representative sections are submitted in block 1A.      SMB      12/29/2023        Report signed out from the following location(s) [place] [address], [place] [address] CLIA #: [id number]  [facility] [address] [place], [place] [address] CLIA #: [id number]       Assessment & Plan:   Problem List Items Addressed This Visit       Other   Bilateral  cellulitis of lower leg - Primary   To both lower legs, after cat scratches to areas.  Will start Keflex TID, CrCl 48, no dose adjustment needed.  Mupirocin to apply over abrasions.  Prednisone sent to help with inflammation.  Recommend she monitor legs closely and if any worsening warmth or redness alert provider ASAP, may trace around area of redness for guidance.  Elevated legs when possible.  Avoid contact of legs with cats.        Follow up plan: Return in about 1 week (around 01/31/2024) for Cellulitis both lower legs.

## 2024-01-24 NOTE — Patient Instructions (Signed)
 Cellulitis, Adult    Cellulitis is a skin infection. The infected area is often warm, red, swollen, and sore. It occurs most often on the legs, feet, and toes, but can happen on any part of the body.  This condition can be life-threatening without treatment. It is very important to get treated right away.  What are the causes?  This condition is caused by bacteria. The bacteria enter through a break in the skin, such as:  A cut.  A burn.  A bug bite.  An animal bite.  An open sore.  A crack.  What increases the risk?  Having a weak body's defense system (immune system).  Being older than 72 years old.  Having a blood sugar problem (diabetes).  Having a long-term liver disease (cirrhosis) or kidney disease.  Being very overweight (obese).  Having a skin problem, such as:  An itchy rash.  A rash caused by a fungus.  A rash with blisters.  Slow movement of blood in the veins (venous stasis).  Fluid buildup below the skin (edema).  This condition is more likely to occur in people who:  Have open cuts, burns, bites, or scrapes on the skin.  Have been treated with high-energy rays (radiation).  Use IV drugs.  What are the signs or symptoms?  Skin that:  Looks red or purple, or slightly darker than your usual skin color.  Has streaks.  Has spots.  Is swollen.  Is sore or painful when you touch it.  Is warm.  A fever.  Chills.  Blisters.  Tiredness (fatigue).  How is this treated?  Medicines to treat infections or allergies.  Rest.  Placing cold or warm cloths on the skin.  Staying in the hospital, if the condition is very bad. You may need medicines through an IV.  Follow these instructions at home:  Medicines  Take over-the-counter and prescription medicines only as told by your doctor.  If you were prescribed antibiotics, take them as told by your doctor. Do not stop using them even if you start to feel better.  General instructions  Drink enough fluid to keep your pee (urine) pale yellow.  Do not touch or rub the  infected area.  Raise (elevate) the infected area above the level of your heart while you are sitting or lying down.  Return to your normal activities when your doctor says that it is safe.  Place cold or warm cloths on the area as told by your doctor.  Keep all follow-up visits. Your doctor will need to make sure that a more serious infection is not developing.  Contact a doctor if:  You have a fever.  You do not start to get better after 1-2 days of treatment.  Your bone or joint under the infected area starts to hurt after the skin has healed.  Your infection comes back in the same area or another area. Signs of this may include:  You have a swollen bump in the area.  Your red area gets larger, turns dark in color, or hurts more.  You have more fluid coming from the wound.  Pus or a bad smell develops in your infected area.  You have more pain.  You feel sick and have muscle aches and weakness.  You develop vomiting or watery poop that will not go away.  Get help right away if:  You see red streaks coming from the area.  You notice the skin turns purple or black and falls  off.  These symptoms may be an emergency. Get help right away. Call 911.  Do not wait to see if the symptoms will go away.  Do not drive yourself to the hospital.  This information is not intended to replace advice given to you by your health care provider. Make sure you discuss any questions you have with your health care provider.  Document Revised: 06/23/2022 Document Reviewed: 06/23/2022  Elsevier Patient Education  2024 [employer].

## 2024-01-24 NOTE — Telephone Encounter (Signed)
 Chief Complaint: leg swelling Symptoms: bilateral leg swelling, pain Frequency: 1 wk Pertinent Negatives: Patient denies fever, drainage, skin that is blue or cold to the touch, numbness/tingling/loss of sensation Disposition: [] ED /[] Urgent Care (no appt availability in office) / [x] Appointment(In office/virtual)/ []  [facility]/ [] Home Care/ [x] Refused Recommended Disposition /[] [facility]/ []  Follow-up with PCP Additional Notes: Pt reports bilateral leg swelling from the knee to ankle for 1 wk. Pt states it is severe and she feels like "my legs are about to bust." Still able to walk but states walking is painful, especially when she first gets up. Denies fever, signs of infection, oozing. RN asked pt to press on her legs to check for pitting, pt endorses pitting. Pt endorses "a little" swelling at the ankle. States the legs feel "a little" warm to the touch. Denies a knot under the skin. States her legs are painful when they bump against things. Denies skin that is blue or white. Denies skin that is cold to the touch. Denies numbness/tingling/loss of sensation. Denies CP or SOB. Pt states she had this problem 1 yr ago but it got better with elevation. Pt unable to elevated her legs because she had a pilonidal cyst removed recently and states elevating her legs puts pressure on her back and in that area. For severe leg swelling RN advised pt she should be seen within 4 hours. No availability today at her office. Pt states she is unwilling to travel to another office to be seen - states she will only go to [facility]. RN advised pt RN will have the appropriate people from the office follow-up with her about scheduling. Pt verbalized understanding. RN advsied pt if she develops CP or SOB or worsening pain, more difficulty walking, skin color change, numbness/tingling she needs to call 911 and she verbalized understanding.   Copied from CRM [id number]. Topic: Clinical - Red Word Triage >> Jan 24, 2024  9:15 AM Franchot Heidelberg wrote: Red Word that prompted transfer to Nurse Triage: Severe swelling, about to bust she says Reason for Disposition  SEVERE leg swelling (e.g., swelling extends above knee, entire leg is swollen, weeping fluid)  Answer Assessment - Initial Assessment Questions 1. ONSET: "When did the swelling start?" (e.g., minutes, hours, days)     1 wk ago 2. LOCATION: "What part of the leg is swollen?"  "Are both legs swollen or just one leg?"     Bilateral legs knees to ankle 3. SEVERITY: "How bad is the swelling?" (e.g., localized; mild, moderate, severe)   - Localized: Small area of swelling localized to one leg.   - MILD pedal edema: Swelling limited to foot and ankle, pitting edema < 1/4 inch (6 mm) deep, rest and elevation eliminate most or all swelling.   - MODERATE edema: Swelling of lower leg to knee, pitting edema > 1/4 inch (6 mm) deep, rest and elevation only partially reduce swelling.   - SEVERE edema: Swelling extends above knee, facial or hand swelling present.      "Very bad", "I say a 10/10", pt endorses pitting - "they're about to bust" 4. REDNESS: "Does the swelling look red or infected?"     "No, it doesn't look infected", "some redness at the ankle" 5. PAIN: "Is the swelling painful to touch?" If Yes, ask: "How painful is it?"   (Scale 1-10; mild, moderate or severe)     7/10 6. FEVER: "Do you have a fever?" If Yes, ask: "What is it, how was  it measured, and when did it start?"      No 7. CAUSE: "What do you think is causing the leg swelling?"     Not sure - "I guess cause I sit in the chair and have my legs down" 8. MEDICAL HISTORY: "Do you have a history of blood clots (e.g., DVT), cancer, heart failure, kidney disease, or liver failure?"     No 9. RECURRENT SYMPTOM: "Have you had leg swelling before?" If Yes, ask: "When was the last time?" "What happened that time?"     1 yr ago, improved w/ elevation 10. OTHER SYMPTOMS: "Do you have any other  symptoms?" (e.g., chest pain, difficulty breathing)       "About a year ago they swelled up but they right went down." States she can't elevate her legs because that puts pressure on her back where she recently had a pilonidal cyst removed. No skin color change, "a little" warm to the touch "but not much", no knot under the skin. "It hurts when I touch it up against something and when when I first get up" - 7/10 pain that kept her up. No numbness/tingling. No hx of DVT or PE. No CP or SOB.  Protocols used: Leg Swelling and Edema-A-AH

## 2024-01-24 NOTE — Assessment & Plan Note (Signed)
 To both lower legs, after cat scratches to areas.  Will start Keflex TID, CrCl 48, no dose adjustment needed.  Mupirocin to apply over abrasions.  Prednisone sent to help with inflammation.  Recommend she monitor legs closely and if any worsening warmth or redness alert provider ASAP, may trace around area of redness for guidance.  Elevated legs when possible.  Avoid contact of legs with cats.

## 2024-01-24 NOTE — Telephone Encounter (Signed)
 Called and scheduled the patient an appointment with Jolene Cannady this afternoon.

## 2024-01-27 ENCOUNTER — Encounter: Admitting: General Surgery

## 2024-01-31 ENCOUNTER — Ambulatory Visit: Admitting: Nurse Practitioner

## 2024-02-01 ENCOUNTER — Encounter: Payer: Self-pay | Admitting: General Surgery

## 2024-02-01 ENCOUNTER — Ambulatory Visit (INDEPENDENT_AMBULATORY_CARE_PROVIDER_SITE_OTHER): Admitting: General Surgery

## 2024-02-01 VITALS — BP 154/71 | HR 102 | Temp 98.2°F | Ht 64.0 in | Wt 135.0 lb

## 2024-02-01 DIAGNOSIS — Z09 Encounter for follow-up examination after completed treatment for conditions other than malignant neoplasm: Secondary | ICD-10-CM

## 2024-02-01 DIAGNOSIS — L0501 Pilonidal cyst with abscess: Secondary | ICD-10-CM

## 2024-02-01 NOTE — Patient Instructions (Signed)
 Change the packing(Aquacel) every 2 days. You may change the top dressing more often.   When you shower remove all of your dressing and packing and wash as usual letting the warm soapy water run over the area. Rinse well and pat dry. You my dry out the area with a clean dry gauze before placing the Aquacel.    Call us with any questions or problems.  Follow up here in 1 month.

## 2024-02-01 NOTE — Progress Notes (Signed)
 Outpatient Surgical Follow Up CC: Pilonidal Cyst Disease 02/01/2024  Kim Graham is an 73 y.o. female.   Chief Complaint  Patient presents with   Routine Post Op    HPI: The patient returns status post pilonidal cyst excision.  She is dressing the wound with Aquacel Ag.  She denies that there is any significant drainage.  She says that her pain is much improved and the area seems to be getting smaller.  She denies any fevers or chills.  She did have some diarrhea today and has tried to prevent her diarrhea from getting into the wound.  Past Medical History:  Diagnosis Date   Acute cholecystitis 12/09/2021   Bile leak    Chronic kidney disease, stage 3a (HCC)    Chronic venous insufficiency    COPD (chronic obstructive pulmonary disease) (HCC)    Depression    Hep C w/o coma, chronic (HCC)    treated   History of kidney stones    Hypertension    Methadone dependence (HCC)    Nephrolithiasis    Pre-diabetes    Sepsis secondary to UTI (HCC)    Thrombocytopenia (HCC)     Past Surgical History:  Procedure Laterality Date   CYSTOSCOPY W/ URETERAL STENT PLACEMENT Right 12/09/2021   Procedure: CYSTOSCOPY WITH RETROGRADE PYELOGRAM/URETERAL STENT PLACEMENT;  Surgeon: Riki Altes, MD;  Location: [facility];  Service: Urology;  Laterality: Right;   CYSTOSCOPY/URETEROSCOPY/HOLMIUM LASER/STENT PLACEMENT Bilateral 02/11/2022   Procedure: CYSTOSCOPY/URETEROSCOPY/HOLMIUM LASER/STENT PLACEMENT & RIGHT URETERAL STENT EXCHANGE;  Surgeon: Riki Altes, MD;  Location: [facility];  Service: Urology;  Laterality: Bilateral;   CYSTOSCOPY/URETEROSCOPY/HOLMIUM LASER/STENT PLACEMENT Bilateral 05/19/2022   Procedure: CYSTOSCOPY/URETEROSCOPY/HOLMIUM LASER/STENT PLACEMENT;  Surgeon: Riki Altes, MD;  Location: [facility];  Service: Urology;  Laterality: Bilateral;   ENDOSCOPIC RETROGRADE CHOLANGIOPANCREATOGRAPHY (ERCP) WITH PROPOFOL N/A 12/11/2021   Procedure: ENDOSCOPIC RETROGRADE  CHOLANGIOPANCREATOGRAPHY (ERCP) WITH PROPOFOL;  Surgeon: [name], MD;  Location: [facility] ENDOSCOPY;  Service: Endoscopy;  Laterality: N/A;   ERCP N/A 03/10/2022   Procedure: ENDOSCOPIC RETROGRADE CHOLANGIOPANCREATOGRAPHY (ERCP);  Surgeon: [name], MD;  [facility];  Service: Endoscopy;  Laterality: N/A;  Stent removal   ERCP N/A 06/23/2022   Procedure: ENDOSCOPIC RETROGRADE CHOLANGIOPANCREATOGRAPHY (ERCP);  Surgeon: [name], MD;  Location: [facility] END[facility]COPY;  Service: Endoscopy;  Laterality: N/A;  stent removal   PILONIDAL CYST EXCISION N/A 12/29/2023   Procedure: CYST EXCISION PILONIDAL EXTENSIVE;  Surgeon: Kandis Cocking, MD;  Location: [facility] OR[facility];  Service: General;  Laterality: N/A;   PLACEMENT OF BREAST IMPLANTS Bilateral    TONSILLECTOMY      Family History  Problem Relation Age of Onset   Diabetes Mother    Cancer Father        Pancreatic   Diabetes Maternal Aunt    Diabetes Maternal Grandmother    Dementia Maternal Grandfather    Heart disease Maternal Grandfather     Social History:  reports that she quit smoking about 5 years ago. Her smoking use included cigarettes. She started smoking about 54 years ago. She has a 24.5 pack-year smoking history. She has been exposed to tobacco smoke. She has never used smokeless tobacco. She reports that she does not currently use alcohol. She reports that she does not currently use drugs after having used the following drugs: Heroin.  Allergies:  Allergies  Allergen Reactions   Doxycycline Diarrhea    Very bad diarrhea    Medications reviewed.    ROS Full ROS performed and is  otherwise negative other than what is stated in HPI   BP (!) 154/71   Pulse (!) 102   Temp 98.2 F (36.8 C)   Ht 5\' 4"  (1.626 m)   Wt 135 lb (61.2 kg)   SpO2 98%   BMI 23.17 kg/m   Physical Exam  Alert and oriented x 3, normal work of breathing on room air, gluteal cleft exam performed in the presence of a chaperone.   Intergluteal cleft there is an area of excision.  At the base there is a minimal amount of fibrinous tissue overlying the muscle but this was easily debrided from the wound.  The wound sides have good granulation tissue healing.  There is no purulent drainage from around.  There is no surrounding erythema and no evidence of recurrence of cysts.   No results found for this or any previous visit (from the past 48 hours). No results found.  Assessment/Plan:  Patient is status post pilonidal cyst excision.  We are letting it heal by secondary intention.  She is placing Aquacel Ag in the wound and covering this with gauze.  The wound appears to be healing and well without evidence of infection.  We will continue Aquacel Ag to the wound base and cover this with gauze.  Recommended that she take a shower if she has any episodes of diarrhea to ensure that no stool gets in the wound.  Will plan to see her again in 4 weeks  Baker Pierini, M.D. [facility]

## 2024-02-21 ENCOUNTER — Ambulatory Visit: Admitting: Nurse Practitioner

## 2024-02-24 ENCOUNTER — Encounter: Admitting: General Surgery

## 2024-02-25 ENCOUNTER — Ambulatory Visit (INDEPENDENT_AMBULATORY_CARE_PROVIDER_SITE_OTHER): Admitting: Family Medicine

## 2024-02-25 ENCOUNTER — Encounter: Payer: Self-pay | Admitting: Family Medicine

## 2024-02-25 VITALS — BP 111/69 | HR 102 | Ht 64.0 in | Wt 134.9 lb

## 2024-02-25 DIAGNOSIS — I872 Venous insufficiency (chronic) (peripheral): Secondary | ICD-10-CM | POA: Diagnosis not present

## 2024-02-25 MED ORDER — FUROSEMIDE 20 MG PO TABS: 20.0000 mg | ORAL_TABLET | Freq: Every day | ORAL | 0 refills | Status: DC

## 2024-02-25 NOTE — Progress Notes (Signed)
 BP 111/69 (BP Location: Left Arm, Patient Position: Sitting, Cuff Size: Normal)   Pulse (!) 102   Ht 5\' 4"  (1.626 m)   Wt 134 lb 14.4 oz (61.2 kg)   SpO2 95%   BMI 23.16 kg/m    Subjective:    Patient ID: Kim Graham, female    DOB: 20-Oct-1951, 73 y.o.   MRN: [id number]  HPI: Kim Graham is a 73 y.o. female  Chief Complaint  Patient presents with   Leg Swelling    Pt states that this has been going on for about 6 months.    Known venous insufficiency since 2021. Last saw vascular in August- recommended to wear compression hose. No need for follow up. ECHO done in 2021 which only showed left ventricular dysfunction with diastolic dysfunction. She has not been wearing her compression socks. She notes that her legs have been swelling and has started having some bubbles on her skin from the swelling. Mild redness, no heat. Otherwise feeling great.  Relevant past medical, surgical, family and social history reviewed and updated as indicated. Interim medical history since our last visit reviewed. Allergies and medications reviewed and updated.  Review of Systems  Constitutional: Negative.   Respiratory: Negative.    Cardiovascular:  Positive for leg swelling. Negative for chest pain and palpitations.  Gastrointestinal: Negative.   Musculoskeletal: Negative.   Neurological: Negative.   Psychiatric/Behavioral: Negative.      Per HPI unless specifically indicated above     Objective:    BP 111/69 (BP Location: Left Arm, Patient Position: Sitting, Cuff Size: Normal)   Pulse (!) 102   Ht 5\' 4"  (1.626 m)   Wt 134 lb 14.4 oz (61.2 kg)   SpO2 95%   BMI 23.16 kg/m   Wt Readings from Last 3 Encounters:  02/25/24 134 lb 14.4 oz (61.2 kg)  02/01/24 135 lb (61.2 kg)  01/24/24 143 lb (64.9 kg)    Physical Exam Vitals and nursing note reviewed.  Constitutional:      General: She is not in acute distress.    Appearance: Normal appearance. She is not ill-appearing,  toxic-appearing or diaphoretic.  HENT:     Head: Normocephalic and atraumatic.     Right Ear: External ear normal.     Left Ear: External ear normal.     Nose: Nose normal.     Mouth/Throat:     Mouth: Mucous membranes are moist.     Pharynx: Oropharynx is clear.  Eyes:     General: No scleral icterus.       Right eye: No discharge.        Left eye: No discharge.     Extraocular Movements: Extraocular movements intact.     Conjunctiva/sclera: Conjunctivae normal.     Pupils: Pupils are equal, round, and reactive to light.  Cardiovascular:     Rate and Rhythm: Normal rate and regular rhythm.     Pulses: Normal pulses.     Heart sounds: Normal heart sounds. No murmur heard.    No friction rub. No gallop.  Pulmonary:     Effort: Pulmonary effort is normal. No respiratory distress.     Breath sounds: Normal breath sounds. No stridor. No wheezing, rhonchi or rales.  Chest:     Chest wall: No tenderness.  Musculoskeletal:        General: Normal range of motion.     Cervical back: Normal range of motion and neck supple.     Right  lower leg: Edema (1+) present.     Left lower leg: Edema (1+) present.  Skin:    General: Skin is warm and dry.     Capillary Refill: Capillary refill takes less than 2 seconds.     Coloration: Skin is not jaundiced or pale.     Findings: Erythema (no heat, + blisters on bilateral legs) present. No bruising, lesion or rash.  Neurological:     General: No focal deficit present.     Mental Status: She is alert and oriented to person, place, and time. Mental status is at baseline.  Psychiatric:        Mood and Affect: Mood normal.        Behavior: Behavior normal.        Thought Content: Thought content normal.        Judgment: Judgment normal.     Results for orders placed or performed during the hospital encounter of 12/29/23  Surgical pathology   Collection Time: 12/29/23 12:00 AM  Result Value Ref Range   SURGICAL PATHOLOGY      SURGICAL  PATHOLOGY [facility] [address] [place], [place] [address] Telephone [phone] or [phone] Fax [phone]  REPORT OF SURGICAL PATHOLOGY   Accession #: [id number] Patient Name: Kim Graham Visit # : [id number]  MRN: [id number] Physician: Severa Daniels DOB/Age [birth year] (Age: 65) Gender: F Collected Date: 12/29/2023 Received Date: 12/29/2023  FINAL DIAGNOSIS       1. Pilonidal cyst/sinus,  :       PILONIDAL CYST.      NEGATIVE FOR MALIGNANCY.       DATE SIGNED OUT: 12/30/2023 ELECTRONIC SIGNATURE : Dillard Frame Md, Pathologist, Electronic Signature  MICROSCOPIC DESCRIPTION  CASE COMMENTS STAINS USED IN DIAGNOSIS: H&E    CLINICAL HISTORY  SPECIMEN(S) OBTAINED 1. Pilonidal cyst/sinus,  SPECIMEN COMMENTS: SPECIMEN CLINICAL INFORMATION: 1. Pilonidal cyst    Gross Description 1. "Pilonidal cyst", received fresh and placed in formalin is a 5.8 x 3.5 1.6 cm aggregate of ragged soft tissue f ragments. The largest fragment is partially surfaced by a tan-gray skin ellipse with central defects and scarring. The cut surface ranges from yellow-red and softened to gray and densely fibrous. Scattered areas of sebaceous material are contained within the cut surface; no hair is identified. Representative sections are submitted in block 1A.      SMB      12/29/2023        Report signed out from the following location(s) [place] [facility] [address] [place], [place] [address] CLIA #: [id number]  [facility] [address] [place], [place] [address] CLIA #: [id number]       Assessment & Plan:   Problem List Items Addressed This Visit       Cardiovascular and Mediastinum   Chronic venous insufficiency - Primary   Will treat wit lasix  for 3 days then encouraged her to make sure she's using her compression hose daily. Recheck 1 month at physical- if not better consider repeating ECHO  as last one was done in 2021. Call with any concerns or if getting worse.       Relevant Medications   furosemide  (LASIX ) 20 MG tablet     Follow up plan: Return in about 31 days (around 03/27/2024) for physical.

## 2024-02-25 NOTE — Assessment & Plan Note (Signed)
 Will treat wit lasix  for 3 days then encouraged her to make sure she's using her compression hose daily. Recheck 1 month at physical- if not better consider repeating ECHO as last one was done in 2021. Call with any concerns or if getting worse.

## 2024-03-02 ENCOUNTER — Encounter: Payer: Self-pay | Admitting: General Surgery

## 2024-03-02 ENCOUNTER — Ambulatory Visit (INDEPENDENT_AMBULATORY_CARE_PROVIDER_SITE_OTHER): Admitting: General Surgery

## 2024-03-02 VITALS — BP 99/63 | HR 102 | Ht 64.0 in | Wt 133.0 lb

## 2024-03-02 DIAGNOSIS — Z09 Encounter for follow-up examination after completed treatment for conditions other than malignant neoplasm: Secondary | ICD-10-CM

## 2024-03-02 DIAGNOSIS — L0501 Pilonidal cyst with abscess: Secondary | ICD-10-CM

## 2024-03-02 NOTE — Progress Notes (Signed)
 Outpatient Surgical Follow Up  03/02/2024  Kim Graham is an 73 y.o. female.   Chief Complaint  Patient presents with   Routine Post Op    HPI: Kim Graham returns today status post wide local excision of pilonidal cyst disease.  She has a wound that has been packed with Aquacel Ag.  She reports that there is still little bit of drainage from it.  She denies any fevers or chills.  She denies any bleeding from the wound or purulence from the.  Past Medical History:  Diagnosis Date   Acute cholecystitis 12/09/2021   Bile leak    Chronic kidney disease, stage 3a (HCC)    Chronic venous insufficiency    COPD (chronic obstructive pulmonary disease) (HCC)    Depression    Hep C w/o coma, chronic (HCC)    treated   History of kidney stones    Hypertension    Methadone  dependence (HCC)    Nephrolithiasis    Pre-diabetes    Sepsis secondary to UTI (HCC)    Thrombocytopenia (HCC)     Past Surgical History:  Procedure Laterality Date   CYSTOSCOPY W/ URETERAL STENT PLACEMENT Right 12/09/2021   Procedure: CYSTOSCOPY WITH RETROGRADE PYELOGRAM/URETERAL STENT PLACEMENT;  Surgeon: Geraline Knapp, MD;  Location: [facility];  Service: Urology;  Laterality: Right;   CYSTOSCOPY/URETEROSCOPY/HOLMIUM LASER/STENT PLACEMENT Bilateral 02/11/2022   Procedure: CYSTOSCOPY/URETEROSCOPY/HOLMIUM LASER/STENT PLACEMENT & RIGHT URETERAL STENT EXCHANGE;  Surgeon: Geraline Knapp, MD;  Location: [facility];  Service: Urology;  Laterality: Bilateral;   CYSTOSCOPY/URETEROSCOPY/HOLMIUM LASER/STENT PLACEMENT Bilateral 05/19/2022   Procedure: CYSTOSCOPY/URETEROSCOPY/HOLMIUM LASER/STENT PLACEMENT;  Surgeon: Geraline Knapp, MD;  Location: [facility];  Service: Urology;  Laterality: Bilateral;   ENDOSCOPIC RETROGRADE CHOLANGIOPANCREATOGRAPHY (ERCP) WITH PROPOFOL  N/A 12/11/2021   Procedure: ENDOSCOPIC RETROGRADE CHOLANGIOPANCREATOGRAPHY (ERCP) WITH PROPOFOL ;  Surgeon: Marnee Sink, MD;  Location: [facility];  Service:  Endoscopy;  Laterality: N/A;   ERCP N/A 03/10/2022   Procedure: ENDOSCOPIC RETROGRADE CHOLANGIOPANCREATOGRAPHY (ERCP);  Surgeon: Marnee Sink, MD;  Location: [facility] END[facility];  Service: Endoscopy;  Laterality: N/A;  Stent removal   ERCP N/A 06/23/2022   Procedure: ENDOSCOPIC RETROGRADE CHOLANGIOPANCREATOGRAPHY (ERCP);  Surgeon: Marnee Sink, MD;  Location[facility] END[facility]COPY;  Service: Endoscopy;  Laterality: N/A;  stent removal   PILONIDAL CYST EXCISION N/A 12/29/2023   Procedure: CYST EXCISION PILONIDAL EXTENSIVE;  Surgeon: Barrett Lick, MD;  Location: [facility];  Service: General;  Laterality: N/A;   PLACEMENT OF BREAST IMPLANTS Bilateral    TONSILLECTOMY      Family History  Problem Relation Age of Onset   Diabetes Mother    Cancer Father        Pancreatic   Diabetes Maternal Aunt    Diabetes Maternal Grandmother    Dementia Maternal Grandfather    Heart disease Maternal Grandfather     Social History:  reports that she quit smoking about 5 years ago. Her smoking use included cigarettes. She started smoking about 54 years ago. She has a 24.5 pack-year smoking history. She has been exposed to tobacco smoke. She has never used smokeless tobacco. She reports that she does not currently use alcohol. She reports that she does not currently use drugs after having used the following drugs: Heroin.  Allergies:  Allergies  Allergen Reactions   Doxycycline  Diarrhea    Very bad diarrhea    Medications reviewed.    ROS Full ROS performed and is otherwise negative other than what is stated in HPI   BP 99/63  Pulse (!) 102   Ht 5\' 4"  (1.626 m)   Wt 133 lb (60.3 kg)   SpO2 97%   BMI 22.83 kg/m   Physical Exam  Buttock exam performed in the presence of a chaperone.  There is an area of excision that is healing well.  There is good granulation tissue at the base and along the sides of the wound.  While the depth is slow to heal the length and width are decreasing substantially from  her last visit.  There is no bleeding and there is no purulence.   No results found for this or any previous visit (from the past 48 hours). No results found.  Assessment/Plan:  Patient status post pilonidal cyst excision.  The wound is still healing up but it looks very good and there is no signs of infection.  We can switch from doing the Aquacel Ag to just dry gauze at the wound base and then cover this with gauze.  She will plan to see me again in 4 weeks.   Severa Daniels, M.D. [facility]

## 2024-03-02 NOTE — Patient Instructions (Signed)
 Use clean dry gauze to pack the area. Place several layers of gauze over the top and tape in place. You may do this every other day or every day if the area becomes saturated.  Follow up here in 1 month.   Please call and ask to speak with a nurse if you develop questions or concerns.

## 2024-03-23 ENCOUNTER — Encounter: Payer: Self-pay | Admitting: General Surgery

## 2024-03-23 ENCOUNTER — Ambulatory Visit (INDEPENDENT_AMBULATORY_CARE_PROVIDER_SITE_OTHER): Admitting: General Surgery

## 2024-03-23 VITALS — BP 146/73 | HR 93 | Temp 98.4°F | Ht 64.0 in | Wt 132.0 lb

## 2024-03-23 DIAGNOSIS — L0591 Pilonidal cyst without abscess: Secondary | ICD-10-CM

## 2024-03-23 DIAGNOSIS — L0501 Pilonidal cyst with abscess: Secondary | ICD-10-CM

## 2024-03-23 DIAGNOSIS — Z09 Encounter for follow-up examination after completed treatment for conditions other than malignant neoplasm: Secondary | ICD-10-CM

## 2024-03-23 NOTE — Patient Instructions (Signed)
Pilonidal Cyst Removal, Care After The following information offers guidance on how to care for yourself after your procedure. Your health care provider may also give you more specific instructions. If you have problems or questions, contact your health care provider. What can I expect after the procedure? After the procedure, it is common to have: Pain. Redness. Some swelling. Some fluid or blood coming from your incision (drainage). You may have more drainage if you have an open incision. Follow these instructions at home: Medicines Take over-the-counter and prescription medicines only as told by your health care provider. If you were prescribed antibiotics, take them as told by your health care provider. Do not stop using the antibiotic even if you start to feel better. Ask your health care provider if the medicine prescribed to you: Requires you to avoid driving or using machinery. Can cause constipation. You may need to take these actions to prevent or treat constipation: Drink enough fluid to keep your urine pale yellow. Take over-the-counter or prescription medicines. Eat foods that are high in fiber, such as beans, whole grains, and fresh fruits and vegetables. Limit foods that are high in fat and processed sugars, such as fried or sweet foods. Incision care  You may need to have a caregiver help you with wound care and dressing changes. Follow instructions from your health care provider about how to take care of your incision. Make sure you: Wash your hands with soap and water for at least 20 seconds before and after you change your bandage (dressing). If soap and water are not available, use hand sanitizer. Change your dressing as told by your health care provider. Leave stitches (sutures), skin glue, or adhesive strips in place. These skin closures may need to stay in place for 2 weeks or longer. If adhesive strip edges start to loosen and curl up, you may trim the loose edges. Do  not remove adhesive strips completely unless your health care provider tells you to do that. Check your incision area every day for signs of infection. If it is hard to see the area, have someone check for you. Check for: More redness, swelling, or pain. More fluid or blood. Warmth. Pus or a bad smell. Managing pain and swelling If directed, put ice on the affected area. To do this: Put ice in a plastic bag. Place a towel between your skin and the bag. Leave the ice on for 20 minutes, 2-3 times a day. If your skin turns bright red, remove the ice right away to prevent skin damage. The risk of skin damage is higher if you cannot feel pain, heat, or cold. Activity Do not do activities that cause pain or irritate the incision area. These may include bike riding, running, sit ups, or anything that involves a twisting motion. Rest as told by your health care provider. Do not sit for a long time without moving. Get up to take short walks every 1-2 hours. This will improve blood flow and breathing. Ask for help if you feel weak or unsteady. Return to your normal activities as told by your health care provider. Ask your health care provider what activities are safe for you. General instructions Do not use any products that contain nicotine or tobacco. These products include cigarettes, chewing tobacco, and vaping devices, such as e-cigarettes. These can delay healing after surgery. If you need help quitting, ask your health care provider. Do not take baths, swim, or use a hot tub until your health care provider approves.   Ask your health care provider if you may take showers. You may only be allowed to take sponge baths. Keep all follow-up visits. This is important to monitor healing. If you had a procedure with wound packing, your packing may be changed or removed at follow-up visits. Contact a health care provider if: You have pain that does not get better with medicine. You have any of these signs  of infection: More redness, swelling, or pain around your incision. More fluid or blood coming from your incision. Warmth coming from your incision. Pus or a bad smell coming from your incision. A fever. Get help right away if: You have severe pain in your abdomen. You have sudden chest pain and shortness of breath. You cough up blood. You faint or lose consciousness. These symptoms may be an emergency. Get help right away. Call 911. Do not wait to see if the symptoms will go away. Do not drive yourself to the hospital. Summary It is common to have some pain and drainage after your procedure. You may have more drainage if you have an open incision. You may need to have a caregiver help you with wound care and dressing changes. Do not do activities that cause pain or irritate the incision area. Contact your health care provider if you have pain that does not get better with medicine or if you have any signs of infection. This information is not intended to replace advice given to you by your health care provider. Make sure you discuss any questions you have with your health care provider. Document Revised: 01/30/2022 Document Reviewed: 01/30/2022 Elsevier Patient Education  2024 [employer] Inc[employer]  

## 2024-03-24 NOTE — Progress Notes (Signed)
 Outpatient Surgical Follow Up  03/24/2024  Kim Graham is an 73 y.o. female.   Chief Complaint  Patient presents with   Routine Post Op    Pilonidal cyst 12/29/23    HPI: Patient status post pilonidal cyst excision.  She is seeing me today for a wound check.  She has continued to pack the wound about every other day.  She says that the drainage has reduced significantly.  She denies any further pain.  She is having normal bowel function.  Past Medical History:  Diagnosis Date   Acute cholecystitis 12/09/2021   Bile leak    Chronic kidney disease, stage 3a (HCC)    Chronic venous insufficiency    COPD (chronic obstructive pulmonary disease) (HCC)    Depression    Hep C w/o coma, chronic (HCC)    treated   History of kidney stones    Hypertension    Methadone  dependence (HCC)    Nephrolithiasis    Pre-diabetes    Sepsis secondary to UTI (HCC)    Thrombocytopenia (HCC)     Past Surgical History:  Procedure Laterality Date   CYSTOSCOPY W/ URETERAL STENT PLACEMENT Right 12/09/2021   Procedure: CYSTOSCOPY WITH RETROGRADE PYELOGRAM/URETERAL STENT PLACEMENT;  Surgeon: Geraline Knapp, MD;  Location: [facility];  Service: Urology;  Laterality: Right;   CYSTOSCOPY/URETEROSCOPY/HOLMIUM LASER/STENT PLACEMENT Bilateral 02/11/2022   Procedure: CYSTOSCOPY/URETEROSCOPY/HOLMIUM LASER/STENT PLACEMENT & RIGHT URETERAL STENT EXCHANGE;  Surgeon: Geraline Knapp, MD;  Location: [facility];  Service: Urology;  Laterality: Bilateral;   CYSTOSCOPY/URETEROSCOPY/HOLMIUM LASER/STENT PLACEMENT Bilateral 05/19/2022   Procedure: CYSTOSCOPY/URETEROSCOPY/HOLMIUM LASER/STENT PLACEMENT;  Surgeon: Geraline Knapp, MD;  Location: [facility];  Service: Urology;  Laterality: Bilateral;   ENDOSCOPIC RETROGRADE CHOLANGIOPANCREATOGRAPHY (ERCP) WITH PROPOFOL  N/A 12/11/2021   Procedure: ENDOSCOPIC RETROGRADE CHOLANGIOPANCREATOGRAPHY (ERCP) WITH PROPOFOL ;  Surgeon: Marnee Sink, MD;  Location: [facility];  Service:  Endoscopy;  Laterality: N/A;   ERCP N/A 03/10/2022   Procedure: ENDOSCOPIC RETROGRADE CHOLANGIOPANCREATOGRAPHY (ERCP);  Surgeon: Marnee Sink, MD;  Location: [facility] ENDOSCOPY;  Service: Endoscopy;  Laterality: N/A;  Stent removal   ERCP N/A 06/23/2022   Procedure: ENDOSCOPIC RETROGRADE CHOLANGIOPANCREATOGRAPHY (ERCP);  Surgeon: Marnee Sink, MD;  Location: [facility] ENDOSCOPY;  Service: Endoscopy;  Laterality: N/A;  stent removal   PILONIDAL CYST EXCISION N/A 12/29/2023   Procedure: CYST EXCISION PILONIDAL EXTENSIVE;  Surgeon: Barrett Lick, MD;  Location: [facility];  Service: General;  Laterality: N/A;   PLACEMENT OF BREAST IMPLANTS Bilateral    TONSILLECTOMY      Family History  Problem Relation Age of Onset   Diabetes Mother    Cancer Father        Pancreatic   Diabetes Maternal Aunt    Diabetes Maternal Grandmother    Dementia Maternal Grandfather    Heart disease Maternal Grandfather     Social History:  reports that she quit smoking about 5 years ago. Her smoking use included cigarettes. She started smoking about 54 years ago. She has a 24.5 pack-year smoking history. She has been exposed to tobacco smoke. She has never used smokeless tobacco. She reports that she does not currently use alcohol. She reports that she does not currently use drugs after having used the following drugs: Heroin.  Allergies:  Allergies  Allergen Reactions   Doxycycline  Diarrhea    Very bad diarrhea    Medications reviewed.    ROS Full ROS performed and is otherwise negative other than what is stated in HPI   BP (!) 146/73  Pulse 93   Temp 98.4 F (36.9 C) (Oral)   Ht 5\' 4"  (1.626 m)   Wt 132 lb (59.9 kg)   SpO2 91%   BMI 22.66 kg/m   Physical Exam  Gluteal cleft exam performed in the presence of a chaperone.  There continues to be a wound that is approximately 3 cm in length, 1 cm in width and about 2 and half centimeters depth.  There is good granulation tissue at the base.  There is no  signs of infection or purulent drainage.   No results found for this or any previous visit (from the past 48 hours). No results found.  Assessment/Plan:  Patient status post pilonidal cyst excision.  Her wound is healing and well.  There is good granulation tissue and no evidence of infection.  She should continue to place gauze in the wound and we will follow-up with her in several weeks for a wound check   Severa Daniels, M.D. [facility]

## 2024-04-11 ENCOUNTER — Encounter: Admitting: Family Medicine

## 2024-04-20 ENCOUNTER — Emergency Department

## 2024-04-20 ENCOUNTER — Emergency Department
Admission: EM | Admit: 2024-04-20 | Discharge: 2024-04-20 | Disposition: A | Discharge status: Home / Self Care | Attending: Emergency Medicine | Admitting: Emergency Medicine

## 2024-04-20 DIAGNOSIS — R6889 Other general symptoms and signs: Secondary | ICD-10-CM | POA: Diagnosis not present

## 2024-04-20 DIAGNOSIS — N189 Chronic kidney disease, unspecified: Secondary | ICD-10-CM | POA: Diagnosis not present

## 2024-04-20 DIAGNOSIS — I129 Hypertensive chronic kidney disease with stage 1 through stage 4 chronic kidney disease, or unspecified chronic kidney disease: Secondary | ICD-10-CM | POA: Diagnosis not present

## 2024-04-20 DIAGNOSIS — S42302A Unspecified fracture of shaft of humerus, left arm, initial encounter for closed fracture: Secondary | ICD-10-CM | POA: Insufficient documentation

## 2024-04-20 DIAGNOSIS — W010XXA Fall on same level from slipping, tripping and stumbling without subsequent striking against object, initial encounter: Secondary | ICD-10-CM | POA: Diagnosis not present

## 2024-04-20 DIAGNOSIS — S80212A Abrasion, left knee, initial encounter: Secondary | ICD-10-CM | POA: Diagnosis not present

## 2024-04-20 DIAGNOSIS — R079 Chest pain, unspecified: Secondary | ICD-10-CM | POA: Diagnosis not present

## 2024-04-20 DIAGNOSIS — S42332A Displaced oblique fracture of shaft of humerus, left arm, initial encounter for closed fracture: Secondary | ICD-10-CM | POA: Diagnosis not present

## 2024-04-20 DIAGNOSIS — J449 Chronic obstructive pulmonary disease, unspecified: Secondary | ICD-10-CM | POA: Insufficient documentation

## 2024-04-20 DIAGNOSIS — S4990XA Unspecified injury of shoulder and upper arm, unspecified arm, initial encounter: Secondary | ICD-10-CM | POA: Diagnosis not present

## 2024-04-20 DIAGNOSIS — S42352A Displaced comminuted fracture of shaft of humerus, left arm, initial encounter for closed fracture: Secondary | ICD-10-CM | POA: Diagnosis not present

## 2024-04-20 DIAGNOSIS — W19XXXA Unspecified fall, initial encounter: Secondary | ICD-10-CM

## 2024-04-20 DIAGNOSIS — S4992XA Unspecified injury of left shoulder and upper arm, initial encounter: Secondary | ICD-10-CM | POA: Diagnosis present

## 2024-04-20 DIAGNOSIS — S42295A Other nondisplaced fracture of upper end of left humerus, initial encounter for closed fracture: Secondary | ICD-10-CM | POA: Diagnosis not present

## 2024-04-20 DIAGNOSIS — S42202A Unspecified fracture of upper end of left humerus, initial encounter for closed fracture: Secondary | ICD-10-CM | POA: Diagnosis not present

## 2024-04-20 MED ORDER — MORPHINE SULFATE (PF) 4 MG/ML IV SOLN
4.0000 mg | Freq: Once | INTRAVENOUS | Status: AC
  Administered 2024-04-20: 4 mg via INTRAVENOUS
  Filled 2024-04-20: qty 1

## 2024-04-20 MED ORDER — OXYCODONE-ACETAMINOPHEN 5-325 MG PO TABS: 1.0000 | ORAL_TABLET | Freq: Four times a day (QID) | ORAL | 0 refills | Status: AC | PRN

## 2024-04-20 NOTE — ED Provider Notes (Signed)
 [facility] Provider Note    Event Date/Time   First MD Initiated Contact with Patient 04/20/24 1154     (approximate)   History   Fall and Arm Injury   HPI  Kim Graham is a 73 y.o. female past ministry significant for CKD, COPD, depression, history of kidney stone, hypertension, methadone  dependence, history of thrombocytopenia, presents to the emergency department following a fall.  Patient had a fall when she was running back inside to get her sunglasses and tripped over a stone.  States that she landed on her left arm.  Denies any head injury or loss of consciousness.  Complaining of pain to her left upper arm.  Denies any neck pain or back pain.  Ambulatory since that time.  Complaining of mild knee pain but states that it was just scraped up and is not broken.  Denies any numbness or weakness.  Not on anticoagulation.     Physical Exam   Triage Vital Signs: ED Triage Vitals  Encounter Vitals Group     BP      Girls Systolic BP Percentile      Girls Diastolic BP Percentile      Boys Systolic BP Percentile      Boys Diastolic BP Percentile      Pulse      Resp      Temp      Temp src      SpO2      Weight      Height      Head Circumference      Peak Flow      Pain Score      Pain Loc      Pain Education      Exclude from Growth Chart     Most recent vital signs: Vitals:   04/20/24 1200  BP: (!) 137/57  Pulse: 94  Resp: 20  Temp: 99 F (37.2 C)  SpO2: 93%    Physical Exam Constitutional:      Appearance: She is well-developed.  HENT:     Head: Atraumatic.   Eyes:     Extraocular Movements: Extraocular movements intact.     Conjunctiva/sclera: Conjunctivae normal.     Pupils: Pupils are equal, round, and reactive to light.    Cardiovascular:     Rate and Rhythm: Normal rate and regular rhythm.  Pulmonary:     Effort: No respiratory distress.  Chest:     Chest wall: No tenderness.  Abdominal:     General: There  is no distension.     Tenderness: There is no abdominal tenderness.   Musculoskeletal:        General: Tenderness present. Normal range of motion.     Cervical back: Normal range of motion.     Comments: Left upper arm with obvious deformity and swelling with underlying tenderness to palpation.  No tenderness to the left elbow or wrist.  No open wounds.  No tenderness to bilateral clavicles.  Left knee with superficial abrasion.  Full flexion and extension and range of motion.   Skin:    General: Skin is warm.     Comments: Chronic venous stasis changes to bilateral lower extremities   Neurological:     General: No focal deficit present.     Mental Status: She is alert. Mental status is at baseline.     IMPRESSION / MDM / ASSESSMENT AND PLAN / ED COURSE  I reviewed the triage vital  signs and the nursing notes.  Differential diagnosis including humerus fracture, shoulder dislocation, clavicle fracture.  Patient without head injury or loss of consciousness do not feel that patient needs a CT scan of her head.  No midline cervical spine tenderness have low suspicion for cervical spine fracture and do not feel that further CT imaging is necessary at this time.  No concern for ligamentous injury.  Patient is neurovascularly intact.  RADIOLOGY I independently reviewed imaging, my interpretation of imaging: X-ray left humerus -left displaced fracture to the mid humeral diaphysis.  Chest x-ray -no acute fracture  LABS (all labs ordered are listed, but only abnormal results are displayed) Labs interpreted as -    Labs Reviewed - No data to display   MDM  Given morphine   Patient with midshaft humerus fracture with displacement.  Discussed with orthopedics Dr. Lydia Sams who recommended coaptation splint and sling.  Placed in the emergency department.  Plan to follow-up with orthopedics in 1 week as an outpatient.  Given a prescription for pain medication.     PROCEDURES:  Critical Care  performed: No  Procedures  Patient's presentation is most consistent with acute complicated illness / injury requiring diagnostic workup.   MEDICATIONS ORDERED IN ED: Medications  morphine  (PF) 4 MG/ML injection 4 mg (4 mg Intravenous Given 04/20/24 1231)  morphine  (PF) 4 MG/ML injection 4 mg (4 mg Intravenous Given 04/20/24 1343)    FINAL CLINICAL IMPRESSION(S) / ED DIAGNOSES   Final diagnoses:  Fall, initial encounter  Closed fracture of shaft of left humerus, unspecified fracture morphology, initial encounter     Rx / DC Orders   ED Discharge Orders          Ordered    oxyCODONE -acetaminophen  (PERCOCET) 5-325 MG tablet  Every 6 hours PRN        04/20/24 1414             Note:  This document was prepared using Dragon voice recognition software and may include unintentional dictation errors.   Viviano Ground, MD 04/20/24 1416

## 2024-04-20 NOTE — ED Notes (Signed)
 Applied splint and sling to left upper arm. Cared and instructions given pt expressed understanding. Will f/u with out patient orthopedics.

## 2024-04-20 NOTE — ED Triage Notes (Signed)
 From home mechanical fall on loose walk way stone outside. Obvious deformity to L upper arm. Denies hitting head. No blood thinners. EMS gave 100 mcg Fentanyl .

## 2024-04-20 NOTE — Discharge Instructions (Signed)
 You were seen in the emergency department following a fall.  You are found to have a broken left humerus.  You are placed in a splint.  You are given a prescription for pain medication.  You need to follow-up as an outpatient with orthopedic surgery, call Dr. Lydia Sams to schedule follow-up appointment in 1 week.  Return to the emergency department for any worsening symptoms.  Pain control: Acetaminophen  (tylenol ) - You can take 650 mg every 6 hours as needed for pain/fever.   You were given a prescription for narcotic pain medications.  Take only if in severe pain.  These are very addictive medications.  These medications can make you constipated.  If you need to take more than 1-2 doses, start a stool softner.  If you become constipated, take 1 capfull of MiraLAX , can repeat untill having regular bowel movements.  Keep this medication out of reach of any children.

## 2024-04-25 ENCOUNTER — Emergency Department
Admission: EM | Admit: 2024-04-25 | Discharge: 2024-04-25 | Disposition: A | Discharge status: Home / Self Care | Attending: Emergency Medicine | Admitting: Emergency Medicine

## 2024-04-25 ENCOUNTER — Other Ambulatory Visit: Payer: Self-pay

## 2024-04-25 ENCOUNTER — Emergency Department

## 2024-04-25 DIAGNOSIS — N1831 Chronic kidney disease, stage 3a: Secondary | ICD-10-CM | POA: Insufficient documentation

## 2024-04-25 DIAGNOSIS — S42332A Displaced oblique fracture of shaft of humerus, left arm, initial encounter for closed fracture: Secondary | ICD-10-CM | POA: Diagnosis not present

## 2024-04-25 DIAGNOSIS — S42302K Unspecified fracture of shaft of humerus, left arm, subsequent encounter for fracture with nonunion: Secondary | ICD-10-CM

## 2024-04-25 DIAGNOSIS — S42202A Unspecified fracture of upper end of left humerus, initial encounter for closed fracture: Secondary | ICD-10-CM | POA: Diagnosis not present

## 2024-04-25 DIAGNOSIS — S42302A Unspecified fracture of shaft of humerus, left arm, initial encounter for closed fracture: Secondary | ICD-10-CM | POA: Diagnosis not present

## 2024-04-25 DIAGNOSIS — S42332K Displaced oblique fracture of shaft of humerus, left arm, subsequent encounter for fracture with nonunion: Secondary | ICD-10-CM | POA: Diagnosis not present

## 2024-04-25 DIAGNOSIS — W010XXD Fall on same level from slipping, tripping and stumbling without subsequent striking against object, subsequent encounter: Secondary | ICD-10-CM | POA: Insufficient documentation

## 2024-04-25 DIAGNOSIS — J449 Chronic obstructive pulmonary disease, unspecified: Secondary | ICD-10-CM | POA: Diagnosis not present

## 2024-04-25 DIAGNOSIS — S42292A Other displaced fracture of upper end of left humerus, initial encounter for closed fracture: Secondary | ICD-10-CM | POA: Diagnosis not present

## 2024-04-25 MED ORDER — OXYCODONE HCL 5 MG PO TABS
5.0000 mg | ORAL_TABLET | Freq: Once | ORAL | Status: AC
  Administered 2024-04-25: 5 mg via ORAL
  Filled 2024-04-25: qty 1

## 2024-04-25 NOTE — ED Provider Notes (Signed)
 [facility] Provider Note    Event Date/Time   First MD Initiated Contact with Patient 04/25/24 1149     (approximate)   History   arm discoloration   HPI  Kim Graham is a 73 y.o. female who presents today for evaluation of discoloration to her left arm.  Patient reports that she had a trip and fall 5 days ago and broke her humerus.  She has been keeping her splint dry and keeping her arm elevated but has developed bruising that is spreading down her arm causing her to be concerned.  She has not had any numbness or tingling.  No fevers or chills.  Pain has not changed.  Patient Active Problem List   Diagnosis Date Noted   Chronic venous insufficiency 06/13/2023   Thrombocytopenia (HCC) 05/21/2022   Chronic kidney disease, stage 3a (HCC) 05/20/2022   Nicotine dependence 05/19/2022   Ureteral calculus, left    Hydronephrosis of left kidney    IFG (impaired fasting glucose) 10/14/2021   Methadone  dependence (HCC) 04/04/2021   Hep C w/o coma, chronic (HCC) 07/11/2020   Depression, recurrent (HCC) 03/18/2018   Advance directive discussed with patient 06/18/2017   Chronic obstructive pulmonary disease (HCC) 06/16/2017          Physical Exam   Triage Vital Signs: ED Triage Vitals  Encounter Vitals Group     BP 04/25/24 1059 116/69     Girls Systolic BP Percentile --      Girls Diastolic BP Percentile --      Boys Systolic BP Percentile --      Boys Diastolic BP Percentile --      Pulse Rate 04/25/24 1059 100     Resp 04/25/24 1059 19     Temp 04/25/24 1059 98.9 F (37.2 C)     Temp Source 04/25/24 1059 Oral     SpO2 04/25/24 1059 93 %     Weight 04/25/24 1102 140 lb (63.5 kg)     Height 04/25/24 1102 5' 4 (1.626 m)     Head Circumference --      Peak Flow --      Pain Score 04/25/24 1102 10     Pain Loc --      Pain Education --      Exclude from Growth Chart --     Most recent vital signs: Vitals:   04/25/24 1059 04/25/24 1430   BP: 116/69 (!) 144/68  Pulse: 100 96  Resp: 19 17  Temp: 98.9 F (37.2 C)   SpO2: 93% 95%    Physical Exam Vitals and nursing note reviewed.  Constitutional:      General: Awake and alert. No acute distress.    Appearance: Normal appearance. The patient is normal weight.  HENT:     Head: Normocephalic and atraumatic.     Mouth: Mucous membranes are moist.  Eyes:     General: PERRL. Normal EOMs        Right eye: No discharge.        Left eye: No discharge.     Conjunctiva/sclera: Conjunctivae normal.  Cardiovascular:     Rate and Rhythm: Normal rate and regular rhythm.     Pulses: Normal pulses.  Pulmonary:     Effort: Pulmonary effort is normal. No respiratory distress.     Breath sounds: Normal breath sounds.  Abdominal:     Abdomen is soft. There is no abdominal tenderness. No rebound or guarding. No distention. Musculoskeletal:  General: No swelling. Normal range of motion.     Cervical back: Normal range of motion and neck supple.  Left upper extremity in a coaptation splint.  Plate removed, no open wounds.  No pitting edema.  Ecchymosis noted to the arm extending to the fingers, though compartments are soft and compressible throughout.  Normal radial pulse.  Normal intrinsic muscle function of the hand. Skin:    General: Skin is warm and dry.     Capillary Refill: Capillary refill takes less than 2 seconds.     Findings: No rash.  Neurological:     Mental Status: The patient is awake and alert.      ED Results / Procedures / Treatments   Labs (all labs ordered are listed, but only abnormal results are displayed) Labs Reviewed - No data to display   EKG     RADIOLOGY I independently reviewed and interpreted imaging and agree with radiologists findings.     PROCEDURES:  Critical Care performed:   Procedures   MEDICATIONS ORDERED IN ED: Medications  oxyCODONE  (Oxy IR/ROXICODONE ) immediate release tablet 5 mg (5 mg Oral Given 04/25/24 1439)      IMPRESSION / MDM / ASSESSMENT AND PLAN / ED COURSE  I reviewed the triage vital signs and the nursing notes.   Differential diagnosis includes, but is not limited to, normal healing process, displaced fracture, compartment syndrome, vascular compromise.  I reviewed the patient's chart.  Patient was seen on 04/20/2024 after a fall and had a midshaft humerus fracture.  Patient is awake and alert, hemodynamically stable and afebrile.  She has no pitting edema to her upper extremity.  She has ecchymosis extending from her shoulder down to her fingertips, though compartments are soft and compressible throughout, normal radial pulse, sensation intact to light touch, no pain out of proportion, not consistent with compartment syndrome.  No pitting edema to suggest DVT.  I suspect normal fracture healing.  X-ray was obtained to ensure no bony migration.  X-ray obtained does demonstrate further displacement of her fracture fragments.  I consulted orthopedics on-call Dr. Daun Epstein who recommends removal of the coaptation splint and application of a posterior long-arm splint instead.  Patient will follow-up with Ortho trauma instead.  Patient feels much more comfortable after her posterior long-arm splint was applied.  We discussed return precautions.  Patient understands and agrees with plan.  She was discharged stable condition.  Patient's presentation is most consistent with acute presentation with potential threat to life or bodily function.   Clinical Course as of 04/25/24 1506  Tue Apr 25, 2024  1323 Discussed with orthopedics.  Plan to remove the coaptation splint and put on a posterior long-arm instead.  Patient will follow-up with Dr. Guyann Leitz with trauma orthopedics instead [JP]    Clinical Course User Index [JP] Chanele Douglas E, PA-C     FINAL CLINICAL IMPRESSION(S) / ED DIAGNOSES   Final diagnoses:  Closed fracture of shaft of left humerus with nonunion, unspecified fracture morphology,  subsequent encounter     Rx / DC Orders   ED Discharge Orders     None        Note:  This document was prepared using Dragon voice recognition software and may include unintentional dictation errors.   Rashi Granier E, PA-C 04/25/24 1506    Kandee Orion, MD 04/26/24 [phone]

## 2024-04-25 NOTE — Discharge Instructions (Addendum)
 Please keep your splint clean and dry.  Keep your arm elevated.  Please follow-up with Dr. Guyann Leitz with orthopedic trauma surgery.  Please return for any new, worsening, or change in symptoms or other concerns.  It was a pleasure caring for you today.

## 2024-04-25 NOTE — ED Notes (Signed)
 Dressing patients right skin tear with steri strips and kirlex wrap.

## 2024-04-25 NOTE — ED Notes (Addendum)
 Upon RN assessing patient. Shoulder sling was not in place correctly and patients left arm was not elevated. This RN repositioned sling to appropriate position and used pillow to elevate arm. Patients left arm is dark in appearance, patient is able to move fingers, Radial pulse is present and strong. Cap refill <3secs.

## 2024-04-25 NOTE — ED Triage Notes (Signed)
 Pt comes with left arm discoloration. Pt state she noticed it on Sunday. Pt not on thinners. Pt was seen here on the 12th of June for a fall and had closed fracture of left humerus. Pt able to move fingers and hand. Pt has dark purple discoloration to hand and fingers. Unable to see further under the splint.

## 2024-04-26 ENCOUNTER — Telehealth: Payer: Self-pay

## 2024-04-26 NOTE — Telephone Encounter (Signed)
 Patient has been scheduled for ED d/c appointment with PCP for 04/28/2024 and understands this appointment is needed in order to order any needed services.   She will ask a friend to help her get mychart downloaded to her phone in preparation.

## 2024-04-26 NOTE — Telephone Encounter (Signed)
 Copied from CRM [id number]. Topic: Appointments - Appointment Scheduling >> Apr 26, 2024 12:33 PM Emylou G wrote: Broken Arm last week?  She needs a referral for someone to come to the house at least 3 times a week for help. >> Apr 26, 2024  3:49 PM Emylou G wrote: Patient called.. checking status of message

## 2024-04-28 ENCOUNTER — Ambulatory Visit: Admitting: Family Medicine

## 2024-04-28 VITALS — BP 149/67 | HR 112 | Ht 64.0 in | Wt 132.0 lb

## 2024-04-28 DIAGNOSIS — S42302D Unspecified fracture of shaft of humerus, left arm, subsequent encounter for fracture with routine healing: Secondary | ICD-10-CM | POA: Diagnosis not present

## 2024-04-28 DIAGNOSIS — Z122 Encounter for screening for malignant neoplasm of respiratory organs: Secondary | ICD-10-CM | POA: Diagnosis not present

## 2024-04-28 DIAGNOSIS — F339 Major depressive disorder, recurrent, unspecified: Secondary | ICD-10-CM

## 2024-04-28 DIAGNOSIS — R7301 Impaired fasting glucose: Secondary | ICD-10-CM

## 2024-04-28 DIAGNOSIS — J441 Chronic obstructive pulmonary disease with (acute) exacerbation: Secondary | ICD-10-CM

## 2024-04-28 DIAGNOSIS — Z87891 Personal history of nicotine dependence: Secondary | ICD-10-CM | POA: Diagnosis not present

## 2024-04-28 DIAGNOSIS — Z136 Encounter for screening for cardiovascular disorders: Secondary | ICD-10-CM | POA: Diagnosis not present

## 2024-04-28 DIAGNOSIS — Z0000 Encounter for general adult medical examination without abnormal findings: Secondary | ICD-10-CM

## 2024-04-28 DIAGNOSIS — N1831 Chronic kidney disease, stage 3a: Secondary | ICD-10-CM | POA: Diagnosis not present

## 2024-04-28 DIAGNOSIS — B182 Chronic viral hepatitis C: Secondary | ICD-10-CM

## 2024-04-28 DIAGNOSIS — D696 Thrombocytopenia, unspecified: Secondary | ICD-10-CM | POA: Diagnosis not present

## 2024-04-28 DIAGNOSIS — Z1231 Encounter for screening mammogram for malignant neoplasm of breast: Secondary | ICD-10-CM

## 2024-04-28 DIAGNOSIS — Z780 Asymptomatic menopausal state: Secondary | ICD-10-CM | POA: Diagnosis not present

## 2024-04-28 LAB — CBC
Hematocrit: 36.9 % (ref 34.0–46.6)
Hemoglobin: 12.6 g/dL (ref 11.1–15.9)
MCH: 29.9 pg (ref 26.6–33.0)
MCHC: 34.1 g/dL (ref 31.5–35.7)
MCV: 88 fL (ref 79–97)
Platelets: 325 10*3/uL (ref 150–450)
RBC: 4.21 x10E6/uL (ref 3.77–5.28)
RDW: 15.9 % — ABNORMAL HIGH (ref 11.7–15.4)
WBC: 11.6 10*3/uL — ABNORMAL HIGH (ref 3.4–10.8)

## 2024-04-28 LAB — BAYER DCA HB A1C WAIVED: HB A1C (BAYER DCA - WAIVED): 6 % — ABNORMAL HIGH (ref 4.8–5.6)

## 2024-04-28 MED ORDER — BREZTRI AEROSPHERE 160-9-4.8 MCG/ACT IN AERO: 2.0000 | INHALATION_SPRAY | Freq: Two times a day (BID) | RESPIRATORY_TRACT | 11 refills | Status: DC

## 2024-04-28 MED ORDER — ARIPIPRAZOLE 30 MG PO TABS: 30.0000 mg | ORAL_TABLET | Freq: Every day | ORAL | 1 refills | Status: AC

## 2024-04-28 MED ORDER — ALBUTEROL SULFATE (2.5 MG/3ML) 0.083% IN NEBU: 2.5000 mg | INHALATION_SOLUTION | Freq: Four times a day (QID) | RESPIRATORY_TRACT | 1 refills | Status: DC | PRN

## 2024-04-28 MED ORDER — ALBUTEROL SULFATE HFA 108 (90 BASE) MCG/ACT IN AERS: 2.0000 | INHALATION_SPRAY | Freq: Four times a day (QID) | RESPIRATORY_TRACT | 6 refills | Status: AC | PRN

## 2024-04-28 NOTE — Patient Instructions (Addendum)
 Upcoming appointments:  Dexa scan 2:20 pm and mammo 3:20 pm 05/02/2024 [facility] at [facility]. No calcium supplements day of appointments.   Preventative Services:  Health Risk Assessment and Personalized Prevention Plan: Done today Bone Mass Measurements: Ordered today Breast Cancer Screening: Ordered today CVD Screening: Ordered today Cervical Cancer Screening: N/A Colon Cancer Screening: Ordered today Depression Screening: Done today Diabetes Screening: Done today Glaucoma Screening: see your eye doctor Hepatitis B vaccine: N/A Hepatitis C screening: Done today HIV Screening: up to date Flu Vaccine: get in the fall Lung cancer Screening: Ordered today Obesity Screening: done today Pneumonia Vaccines (2): up to date STI Screening: N/A  Please call to schedule your mammogram and/or bone density: [facility] at [facility]  Address: [address], [place], [place] [address] Phone: [phone]  [facility] [address] [place],  [place]  [address] Phone: [phone]    Ms. Arrambide , Thank you for taking time to come for your Medicare Wellness Visit. I appreciate your ongoing commitment to your health goals. Please review the following plan we discussed and let me know if I can assist you in the future.   These are the goals we discussed:  Goals      Patient Stated     05/17/2020, to get over broken shoulder        This is a list of the screening recommended for you and due dates:  Health Maintenance  Topic Date Due   Screening for Lung Cancer  Never done   Mammogram  Never done   DEXA scan (bone density measurement)  Never done   Zoster (Shingles) Vaccine (2 of 2) 02/01/2023   Cologuard (Stool DNA test)  08/27/2023   Yearly kidney function blood test for diabetes  03/25/2024   Yearly kidney health urinalysis for diabetes  03/25/2024   Medicare Annual Wellness Visit  03/25/2024   COVID-19  Vaccine (7 - 2024-25 season) 07/10/2024*   Flu Shot  06/09/2024   DTaP/Tdap/Td vaccine (2 - Tdap) 07/02/2030   Pneumococcal Vaccine for age over 55  Completed   Hepatitis C Screening  Completed   HPV Vaccine  Aged Out   Meningitis B Vaccine  Aged Out  *Topic was postponed. The date shown is not the original due date.

## 2024-04-28 NOTE — Progress Notes (Unsigned)
 BP (!) 149/67 (BP Location: Left Arm, Patient Position: Sitting, Cuff Size: Small)   Pulse (!) 112   Ht 5' 4 (1.626 m)   Wt 132 lb (59.9 kg)   SpO2 93%   BMI 22.66 kg/m    Subjective:    Patient ID: Ardelle Kos, female    DOB: [birth year], 73 y.o.   MRN: [id number]  HPI: Heidee Audi Hada is a 73 y.o. female presenting on 04/28/2024 for comprehensive medical examination. Current medical complaints include:  ER FOLLOW UP Time since discharge: 3 days Hospital/facility:  [facility] Diagnosis:  Closed fracture of L humerus with nonunion Procedures/tests: x-rays of humerus and chest Consultants: Ortho and trauma ortho New medications:  oxycodone  Discharge instructions:  follow up with trauma ortho (05/02/24) Status: stable- has not been able to care for herself with the arm. Unable to drive. Unable to do most of her ADLs. Lives alone and has had a lot of trouble.   Impaired Fasting Glucose HbA1C:  Lab Results  Component Value Date   HGBA1C 6.2 (H) 03/26/2023   Duration of elevated blood sugar: chronic Polydipsia: no Polyuria: no Weight change: no Visual disturbance: no Glucose Monitoring: no Diabetic Education: Not Completed Family history of diabetes: yes  COPD COPD status: stable Satisfied with current treatment?: yes Oxygen use: no Dyspnea frequency: occasionally Cough frequency: rarely Rescue inhaler frequency: rarely   Limitation of activity: no Productive cough: no Pneumovax: Up to Date Influenza: Up to Date  DEPRESSION Mood status: exacerbated Satisfied with current treatment?: no Symptom severity: moderate  Duration of current treatment : chronic Side effects: no Medication compliance: good compliance Psychotherapy/counseling: no  Previous psychiatric medications: abilify  Depressed mood: yes Anxious mood: yes Anhedonia: no Significant weight loss or gain: no Insomnia: yes  Fatigue: yes Feelings of worthlessness or guilt: no Impaired  concentration/indecisiveness: no Suicidal ideations: no Hopelessness: no Crying spells: no    04/28/2024    3:05 PM 01/24/2024    2:48 PM 11/24/2023   10:40 AM 11/08/2023    3:21 PM 09/21/2023   11:21 AM  Depression screen PHQ 2/9  Decreased Interest 0 0 0 1 1  Down, Depressed, Hopeless 2 1 0 1 1  PHQ - 2 Score 2 1 0 2 2  Altered sleeping 1 1 0 0 0  Tired, decreased energy 2 1 1 1  0  Change in appetite 1 0 0 0 0  Feeling bad or failure about yourself  3 0 1 0 0  Trouble concentrating 0 0 0 0 0  Moving slowly or fidgety/restless 0 0 0 0 0  Suicidal thoughts 0 0 0 0 0  PHQ-9 Score 9 3 2 3 2   Difficult doing work/chores Very difficult Not difficult at all Not difficult at all Not difficult at all Not difficult at all    She currently lives with: alone Menopausal Symptoms: no  Functional Status Survey: Is the patient deaf or have difficulty hearing?: No Does the patient have difficulty seeing, even when wearing glasses/contacts?: No Does the patient have difficulty concentrating, remembering, or making decisions?: No Does the patient have difficulty walking or climbing stairs?: Yes Does the patient have difficulty dressing or bathing?: Yes (Broken left arm) Does the patient have difficulty doing errands alone such as visiting a doctor's office or shopping?: No     04/28/2024    2:24 PM 01/24/2024    2:47 PM 11/24/2023   10:41 AM 11/11/2023    2:18 PM 11/08/2023    3:21 PM  Fall Risk   Falls in the past year? 1 0 1 0 1  Number falls in past yr: 1 0 0 0 0  Injury with Fall? 1 0 0 0 0  Comment Broken left arm      Risk for fall due to : Impaired balance/gait No Fall Risks History of fall(s);Medication side effect  No Fall Risks  Follow up  Falls evaluation completed Falls evaluation completed  Falls evaluation completed    Depression Screen    04/28/2024    3:05 PM 01/24/2024    2:48 PM 11/24/2023   10:40 AM 11/08/2023    3:21 PM 09/21/2023   11:21 AM  Depression screen PHQ  2/9  Decreased Interest 0 0 0 1 1  Down, Depressed, Hopeless 2 1 0 1 1  PHQ - 2 Score 2 1 0 2 2  Altered sleeping 1 1 0 0 0  Tired, decreased energy 2 1 1 1  0  Change in appetite 1 0 0 0 0  Feeling bad or failure about yourself  3 0 1 0 0  Trouble concentrating 0 0 0 0 0  Moving slowly or fidgety/restless 0 0 0 0 0  Suicidal thoughts 0 0 0 0 0  PHQ-9 Score 9 3 2 3 2   Difficult doing work/chores Very difficult Not difficult at all Not difficult at all Not difficult at all Not difficult at all     Advanced Directives Does patient have a HCPOA?    no If yes, name and contact information:  Does patient have a living will or MOST form?  no  Past Medical History:  Past Medical History:  Diagnosis Date  . Acute cholecystitis 12/09/2021  . Bile leak   . Chronic kidney disease, stage 3a (HCC)   . Chronic venous insufficiency   . COPD (chronic obstructive pulmonary disease) (HCC)   . Depression   . Hep C w/o coma, chronic (HCC)    treated  . History of kidney stones   . Hypertension   . Methadone  dependence (HCC)   . Nephrolithiasis   . Pre-diabetes   . Sepsis secondary to UTI (HCC)   . Thrombocytopenia [facility])     Surgical History:  Past Surgical History:  Procedure Laterality Date  . CYSTOSCOPY W/ URETERAL STENT PLACEMENT Right 12/09/2021   Procedure: CYSTOSCOPY WITH RETROGRADE PYELOGRAM/URETERAL STENT PLACEMENT;  Surgeon: Geraline Knapp, MD;  Location: [facility] ORS;  Service: Urology;  Laterality: Right;  . CYSTOSCOPY/URETEROSCOPY/HOLMIUM LASER/STENT PLACEMENT Bilateral 02/11/2022   Procedure: CYSTOSCOPY/URETEROSCOPY/HOLMIUM LASER/STENT PLACEMENT & RIGHT URETERAL STENT EXCHANGE;  Surgeon: Geraline Knapp, MD;  Location: [facility] ORS;  Service: Urology;  Laterality: Bilateral;  . CYSTOSCOPY/URETEROSCOPY/HOLMIUM LASER/STENT PLACEMENT Bilateral 05/19/2022   Procedure: CYSTOSCOPY/URETEROSCOPY/HOLMIUM LASER/STENT PLACEMENT;  Surgeon: Geraline Knapp, MD;  Location: [facility] ORS;  Service:  Urology;  Laterality: Bilateral;  . ENDOSCOPIC RETROGRADE CHOLANGIOPANCREATOGRAPHY (ERCP) WITH PROPOFOL  N/A 12/11/2021   Procedure: ENDOSCOPIC RETROGRADE CHOLANGIOPANCREATOGRAPHY (ERCP) WITH PROPOFOL ;  Surgeon: Marnee Sink, MD;  Location: [facility] ENDOSCOPY;  Service: Endoscopy;  Laterality: N/A;  . ERCP N/A 03/10/2022   Procedure: ENDOSCOPIC RETROGRADE CHOLANGIOPANCREATOGRAPHY (ERCP);  Surgeon: Marnee Sink, MD;  Location: [facility] ENDOSCOPY;  Service: Endoscopy;  Laterality: N/A;  Stent removal  . ERCP N/A 06/23/2022   Procedure: ENDOSCOPIC RETROGRADE CHOLANGIOPANCREATOGRAPHY (ERCP);  Surgeon: Marnee Sink, MD;  Location: [facility] ENDOSCOPY;  Service: Endoscopy;  Laterality: N/A;  stent removal  . PILONIDAL CYST EXCISION N/A 12/29/2023   Procedure: CYST EXCISION PILONIDAL EXTENSIVE;  Surgeon: Barrett Lick, MD;  Location: [facility] ORS;  Service: General;  Laterality: N/A;  . PLACEMENT OF BREAST IMPLANTS Bilateral   . TONSILLECTOMY      Medications:  Current Outpatient Medications on File Prior to Visit  Medication Sig  . ascorbic acid  (VITAMIN C) 500 MG tablet Take 500 mg by mouth daily.  . fluticasone  (FLONASE ) 50 MCG/ACT nasal spray Place 2 sprays into both nostrils daily. (Patient taking differently: Place 2 sprays into both nostrils daily as needed for allergies or rhinitis.)  . furosemide  (LASIX ) 20 MG tablet Take 1 tablet (20 mg total) by mouth daily.  . methadone  (DOLOPHINE ) 10 MG/ML solution Take 100 mg by mouth daily.  . Multiple Vitamin (MULTI-VITAMIN) tablet Take 1 tablet by mouth daily.  . mupirocin  ointment (BACTROBAN ) 2 % Apply 1 Application topically 2 (two) times daily. Apply to areas of abrasions on lower legs.  . ondansetron  (ZOFRAN ) 4 MG tablet Take 1 tablet (4 mg total) by mouth every 8 (eight) hours as needed for nausea or vomiting.  Aaron Aas Spacer/Aero-Holding Ismael Maria Use as directed Dx: COPD   No current facility-administered medications on file prior to visit.    Allergies:   Allergies  Allergen Reactions  . Doxycycline  Diarrhea    Very bad diarrhea    Social History:  Social History   Socioeconomic History  . Marital status: Single    Spouse name: Not on file  . Number of children: Not on file  . Years of education: Not on file  . Highest education level: Not on file  Occupational History  . Not on file  Tobacco Use  . Smoking status: Former    Current packs/day: 0.00    Average packs/day: 0.5 packs/day for 49.0 years (24.5 ttl pk-yrs)    Types: Cigarettes    Start date: 12/13/1969    Quit date: 12/13/2018    Years since quitting: 5.3    Passive exposure: Past  . Smokeless tobacco: Never  Vaping Use  . Vaping status: Never Used  Substance and Sexual Activity  . Alcohol use: Not Currently    Comment: On occasion  . Drug use: Not Currently    Types: Heroin    Comment: revovering addict  . Sexual activity: Not Currently  Other Topics Concern  . Not on file  Social History Narrative  . Not on file   Social Drivers of Health   Financial Resource Strain: Medium Risk (08/05/2021)   Overall Financial Resource Strain (CARDIA)   . Difficulty of Paying Living Expenses: Somewhat hard  Food Insecurity: Food Insecurity Present (08/05/2021)   Hunger Vital Sign   . Worried About Programme researcher, broadcasting/film/video in the Last Year: Sometimes true   . Ran Out of Food in the Last Year: Sometimes true  Transportation Needs: No Transportation Needs (08/05/2021)   PRAPARE - Transportation   . Lack of Transportation (Medical): No   . Lack of Transportation (Non-Medical): No  Physical Activity: Insufficiently Active (08/05/2021)   Exercise Vital Sign   . Days of Exercise per Week: 4 days   . Minutes of Exercise per Session: 20 min  Stress: No Stress Concern Present (08/05/2021)   [employer] of Occupational Health - Occupational Stress Questionnaire   . Feeling of Stress : Not at all  Social Connections: Moderately Isolated (08/05/2021)   Social Connection and  Isolation Panel   . Frequency of Communication with Friends and Family: More than three times a week   . Frequency of Social Gatherings with Friends and Family: Three  times a week   . Attends Religious Services: Never   . Active Member of Clubs or Organizations: No   . Attends Banker Meetings: More than 4 times per year   . Marital Status: Divorced  Catering manager Violence: Not At Risk (08/05/2021)   Humiliation, Afraid, Rape, and Kick questionnaire   . Fear of Current or Ex-Partner: No   . Emotionally Abused: No   . Physically Abused: No   . Sexually Abused: No   Social History   Tobacco Use  Smoking Status Former  . Current packs/day: 0.00  . Average packs/day: 0.5 packs/day for 49.0 years (24.5 ttl pk-yrs)  . Types: Cigarettes  . Start date: 12/13/1969  . Quit date: 12/13/2018  . Years since quitting: 5.3  . Passive exposure: Past  Smokeless Tobacco Never   Social History   Substance and Sexual Activity  Alcohol Use Not Currently   Comment: On occasion    Family History:  Family History  Problem Relation Age of Onset  . Diabetes Mother   . Cancer Father        Pancreatic  . Diabetes Maternal Aunt   . Diabetes Maternal Grandmother   . Dementia Maternal Grandfather   . Heart disease Maternal Grandfather     Past medical history, surgical history, medications, allergies, family history and social history reviewed with patient today and changes made to appropriate areas of the chart.   Review of Systems  Constitutional: Negative.   HENT: Negative.    Eyes: Negative.   Respiratory: Negative.    Cardiovascular: Negative.   Gastrointestinal: Negative.   Genitourinary: Negative.   Musculoskeletal:  Positive for falls, joint pain and myalgias. Negative for back pain and neck pain.  Skin: Negative.   Neurological: Negative.   Endo/Heme/Allergies:  Negative for environmental allergies and polydipsia. Bruises/bleeds easily.  Psychiatric/Behavioral:   Positive for depression. Negative for hallucinations, memory loss, substance abuse and suicidal ideas. The patient is not nervous/anxious and does not have insomnia.     All other ROS negative except what is listed above and in the HPI.      Objective:    BP (!) 149/67 (BP Location: Left Arm, Patient Position: Sitting, Cuff Size: Small)   Pulse (!) 112   Ht 5' 4 (1.626 m)   Wt 132 lb (59.9 kg)   SpO2 93%   BMI 22.66 kg/m   Wt Readings from Last 3 Encounters:  04/28/24 132 lb (59.9 kg)  04/25/24 140 lb (63.5 kg)  03/23/24 132 lb (59.9 kg)    Physical Exam Vitals and nursing note reviewed.  Constitutional:      General: She is not in acute distress.    Appearance: Normal appearance. She is not ill-appearing, toxic-appearing or diaphoretic.  HENT:     Head: Normocephalic and atraumatic.     Right Ear: External ear normal.     Left Ear: External ear normal.     Nose: Nose normal.     Mouth/Throat:     Mouth: Mucous membranes are moist.     Pharynx: Oropharynx is clear.   Eyes:     General: No scleral icterus.       Right eye: No discharge.        Left eye: No discharge.     Extraocular Movements: Extraocular movements intact.     Conjunctiva/sclera: Conjunctivae normal.     Pupils: Pupils are equal, round, and reactive to light.    Cardiovascular:     Rate  and Rhythm: Normal rate and regular rhythm.     Pulses: Normal pulses.     Heart sounds: Normal heart sounds. No murmur heard.    No friction rub. No gallop.  Pulmonary:     Effort: Pulmonary effort is normal. No respiratory distress.     Breath sounds: Normal breath sounds. No stridor. No wheezing, rhonchi or rales.  Chest:     Chest wall: No tenderness.   Musculoskeletal:        General: Normal range of motion.     Cervical back: Normal range of motion and neck supple.     Comments: L arm with extensive bruising and in splint with sling   Skin:    General: Skin is warm and dry.     Capillary Refill:  Capillary refill takes less than 2 seconds.     Coloration: Skin is not jaundiced or pale.     Findings: Bruising present. No erythema, lesion or rash.   Neurological:     General: No focal deficit present.     Mental Status: She is alert and oriented to person, place, and time. Mental status is at baseline.   Psychiatric:        Mood and Affect: Mood normal.        Behavior: Behavior normal.        Thought Content: Thought content normal.        Judgment: Judgment normal.       04/28/2024    3:07 PM 03/26/2023    2:03 PM 08/05/2021    5:46 PM 07/02/2020    2:43 PM 05/17/2020   11:28 AM  6CIT Screen  What Year? 0 points 0 points 0 points 0 points 0 points  What month? 0 points 0 points 0 points 0 points 0 points  What time? 0 points 0 points 0 points 0 points 0 points  Count back from 20 0 points 0 points 0 points 0 points 0 points  Months in reverse 0 points 0 points 0 points 0 points 0 points  Repeat phrase 2 points 0 points 0 points 0 points 2 points  Total Score 2 points 0 points 0 points 0 points 2 points    Results for orders placed or performed during the hospital encounter of 12/29/23  Surgical pathology   Collection Time: 12/29/23 12:00 AM  Result Value Ref Range   SURGICAL PATHOLOGY      SURGICAL PATHOLOGY [facility] [address] [place], [place] [address] Telephone [phone] or [phone] Fax [phone]  REPORT OF SURGICAL PATHOLOGY   Accession #: [id number] Patient Name: ELNORA, QUIZON Visit # : [id number]  MRN: [id number] Physician: Severa Daniels DOB/Age 10/14/1951 (Age: 29) Gender: F Collected Date: 12/29/2023 Received Date: 12/29/2023  FINAL DIAGNOSIS       1. Pilonidal cyst/sinus,  :       PILONIDAL CYST.      NEGATIVE FOR MALIGNANCY.       DATE SIGNED OUT: 12/30/2023 ELECTRONIC SIGNATURE : Dillard Frame Md, Pathologist, Electronic Signature  MICROSCOPIC DESCRIPTION  CASE COMMENTS STAINS USED IN  DIAGNOSIS: H&E    CLINICAL HISTORY  SPECIMEN(S) OBTAINED 1. Pilonidal cyst/sinus,  SPECIMEN COMMENTS: SPECIMEN CLINICAL INFORMATION: 1. Pilonidal cyst    Gross Description 1. Pilonidal cyst, received fresh and placed in formalin is a 5.8 x 3.5 1.6 cm aggregate of ragged soft tissue f ragments. The largest fragment is partially surfaced by a tan-gray skin ellipse with central defects and  scarring. The cut surface ranges from yellow-red and softened to gray and densely fibrous. Scattered areas of sebaceous material are contained within the cut surface; no hair is identified. Representative sections are submitted in block 1A.      SMB      12/29/2023        Report signed out from the following location(s) [facility] [address] [place], [place] [address] CLIA #: [id number]  [facility] [address] [place], [place] [address] CLIA #: [id number]       Assessment & Plan:   Problem List Items Addressed This Visit       Respiratory   Chronic obstructive pulmonary disease (HCC)   Relevant Medications   albuterol  (PROVENTIL ) (2.5 MG/3ML) 0.083% nebulizer solution   albuterol  (VENTOLIN  HFA) 108 (90 Base) MCG/ACT inhaler   budesonide-glycopyrrolate -formoterol (BREZTRI  AEROSPHERE) 160-9-4.8 MCG/ACT AERO inhaler   Other Relevant Orders   Comprehensive metabolic panel with GFR   TSH   Ambulatory referral to Home Health   AMB Referral VBCI Care Management     Digestive   Hep C w/o coma, chronic (HCC)   Relevant Orders   Comprehensive metabolic panel with GFR   TSH   HCV RNA quant   Ambulatory referral to Home Health   AMB Referral VBCI Care Management     Endocrine   IFG (impaired fasting glucose) - Primary   Relevant Orders   Comprehensive metabolic panel with GFR   Lipid Panel w/o Chol/HDL Ratio   TSH   Microalbumin, Urine Waived   Bayer DCA Hb A1c Waived   Ambulatory referral to Home Health   AMB Referral VBCI  Care Management     Genitourinary   Chronic kidney disease, stage 3a (HCC)   Relevant Orders   Comprehensive metabolic panel with GFR   TSH   Microalbumin, Urine Waived   CBC   Ambulatory referral to Home Health   AMB Referral VBCI Care Management     Hematopoietic and Hemostatic   Thrombocytopenia (HCC)   Relevant Orders   Comprehensive metabolic panel with GFR   TSH   Ambulatory referral to Home Health   AMB Referral VBCI Care Management     Other   Depression, recurrent (HCC)   Relevant Orders   Comprehensive metabolic panel with GFR   TSH   Ambulatory referral to Home Health   AMB Referral VBCI Care Management   Other Visit Diagnoses       Closed fracture of shaft of left humerus with routine healing, unspecified fracture morphology, subsequent encounter       Relevant Orders   Ambulatory referral to Home Health   AMB Referral VBCI Care Management     Postmenopausal estrogen deficiency       With recent fall and fracture. Needs DEXA. Ordered today.   Relevant Orders   DG Bone Density     Encounter for screening mammogram for malignant neoplasm of breast       Mammogram ordered today.   Relevant Orders   MM DIGITAL SCREENING W/ IMPLANTS BILATERAL     Screening for cardiovascular condition       Labs drawn today. Await results.   Relevant Orders   Cologuard   CBC     Encounter for screening for malignant neoplasm of lung in former smoker who quit in past 15 years with 20 pack year history or greater       Referral for low dose CT placed today.  Relevant Orders   Ambulatory Referral Lung Cancer Screening [facility]        Preventative Services:  Health Risk Assessment and Personalized Prevention Plan: Done today Bone Mass Measurements: Ordered today Breast Cancer Screening: Ordered today CVD Screening: Ordered today Cervical Cancer Screening: N/A Colon Cancer Screening: Ordered today Depression Screening: Done today Diabetes Screening: Done  today Glaucoma Screening: see your eye doctor Hepatitis B vaccine: N/A Hepatitis C screening: Done today HIV Screening: up to date Flu Vaccine: get in the fall Lung cancer Screening: Ordered today Obesity Screening: done today Pneumonia Vaccines (2): up to date STI Screening: N/A  Follow up plan: No follow-ups on file.   LABORATORY TESTING:  - Pap smear: not applicable  IMMUNIZATIONS:   - Tdap: Tetanus vaccination status reviewed: last tetanus booster within 10 years. - Influenza: Postponed to flu season - Pneumovax: Up to date - Prevnar: Up to date - Zostavax vaccine: Refused  SCREENING: -Mammogram: Ordered today  - Colonoscopy: Ordered today  - Bone Density: Ordered today    PATIENT COUNSELING:   Advised to take 1 mg of folate supplement per day if capable of pregnancy.   Sexuality: Discussed sexually transmitted diseases, partner selection, use of condoms, avoidance of unintended pregnancy  and contraceptive alternatives.   Advised to avoid cigarette smoking.  I discussed with the patient that most people either abstain from alcohol or drink within safe limits (<=14/week and <=4 drinks/occasion for males, <=7/weeks and <= 3 drinks/occasion for females) and that the risk for alcohol disorders and other health effects rises proportionally with the number of drinks per week and how often a drinker exceeds daily limits.  Discussed cessation/primary prevention of drug use and availability of treatment for abuse.   Diet: Encouraged to adjust caloric intake to maintain  or achieve ideal body weight, to reduce intake of dietary saturated fat and total fat, to limit sodium intake by avoiding high sodium foods and not adding table salt, and to maintain adequate dietary potassium and calcium preferably from fresh fruits, vegetables, and low-fat dairy products.    stressed the importance of regular exercise  Injury prevention: Discussed safety belts, safety helmets, smoke  detector, smoking near bedding or upholstery.   Dental health: Discussed importance of regular tooth brushing, flossing, and dental visits.    NEXT PREVENTATIVE PHYSICAL DUE IN 1 YEAR. No follow-ups on file.

## 2024-04-29 LAB — COMPREHENSIVE METABOLIC PANEL WITH GFR
ALT: 57 IU/L — ABNORMAL HIGH (ref 0–32)
AST: 58 IU/L — ABNORMAL HIGH (ref 0–40)
Albumin: 4.1 g/dL (ref 3.8–4.8)
Alkaline Phosphatase: 78 IU/L (ref 44–121)
BUN/Creatinine Ratio: 18 (ref 12–28)
BUN: 16 mg/dL (ref 8–27)
Bilirubin Total: 0.6 mg/dL (ref 0.0–1.2)
CO2: 20 mmol/L (ref 20–29)
Calcium: 9.5 mg/dL (ref 8.7–10.3)
Chloride: 99 mmol/L (ref 96–106)
Creatinine, Ser: 0.91 mg/dL (ref 0.57–1.00)
Globulin, Total: 2.1 g/dL (ref 1.5–4.5)
Glucose: 118 mg/dL — ABNORMAL HIGH (ref 70–99)
Potassium: 3 mmol/L — ABNORMAL LOW (ref 3.5–5.2)
Sodium: 138 mmol/L (ref 134–144)
Total Protein: 6.2 g/dL (ref 6.0–8.5)
eGFR: 67 mL/min/{1.73_m2} (ref >59)

## 2024-04-29 LAB — LIPID PANEL W/O CHOL/HDL RATIO
Cholesterol, Total: 127 mg/dL (ref 100–199)
HDL: 34 mg/dL — ABNORMAL LOW (ref >39)
LDL Chol Calc (NIH): 65 mg/dL (ref 0–99)
Triglycerides: 166 mg/dL — ABNORMAL HIGH (ref 0–149)
VLDL Cholesterol Cal: 28 mg/dL (ref 5–40)

## 2024-04-29 LAB — HCV RNA QUANT: Hepatitis C Quantitation: NOT DETECTED [IU]/mL

## 2024-04-29 LAB — TSH: TSH: 1.01 u[IU]/mL (ref 0.450–4.500)

## 2024-04-30 ENCOUNTER — Encounter: Payer: Self-pay | Admitting: Family Medicine

## 2024-04-30 NOTE — Assessment & Plan Note (Signed)
 Rechecking labs today. Await results. Treat as needed.

## 2024-04-30 NOTE — Assessment & Plan Note (Signed)
 Under good control on current regimen. Continue current regimen. Continue to monitor. Call with any concerns. Refills given. Labs drawn today.

## 2024-05-01 ENCOUNTER — Telehealth: Payer: Self-pay

## 2024-05-01 ENCOUNTER — Ambulatory Visit: Payer: Self-pay | Admitting: Family Medicine

## 2024-05-01 ENCOUNTER — Ambulatory Visit: Payer: Self-pay

## 2024-05-01 NOTE — Progress Notes (Signed)
 Complex Care Management Note  Care Guide Note 05/01/2024 Name: [name] MRN: [id number] DOB: April 22, 1951  Kim Graham is a 73 y.o. year old female who sees Vicci Duwaine SQUIBB, DO for primary care. I reached out to Kim Graham by phone today to offer complex care management services.  Ms. Croslin was given information about Complex Care Management services today including:   The Complex Care Management services include support from the care team which includes your Nurse Care Manager, Clinical Social Worker, or Pharmacist.  The Complex Care Management team is here to help remove barriers to the health concerns and goals most important to you. Complex Care Management services are voluntary, and the patient may decline or stop services at any time by request to their care team member.   Complex Care Management Consent Status: Patient agreed to services and verbal consent obtained.   Follow up plan:  Telephone appointment with complex care management team member scheduled for:  LCSW 05/05/2024 RNCM 05/10/2024  Encounter Outcome:  Patient Scheduled  Jeoffrey Buffalo , RMA     [place]  [facility], [facility] Guide  Direct Dial: [phone]  Website: [email or web address]

## 2024-05-01 NOTE — Telephone Encounter (Addendum)
 Referral was placed urgently last week. I'm not sure if someone can come out today if they are not available. Referrals- can someone check on this? Unfortunately if she is unable to care for herself and they can't come out- she may need to go to the ER

## 2024-05-01 NOTE — Telephone Encounter (Signed)
 FYI Only or Action Required?: Action required by provider: referral request, clinical question for provider, update on patient condition, and request for documentation or forms.  Patient was last seen in primary care on 04/28/2024 by Vicci Bouchard P, DO. Called Nurse Triage reporting No chief complaint on file.. Symptoms began several weeks ago. Symptoms are: unchanged.  Triage Disposition: No disposition on file.  Patient/caregiver understands and will follow disposition?:             Copied from CRM [id number]. Topic: Clinical - Red Word Triage >> May 01, 2024  9:25 AM Edsel HERO wrote: Red Word that prompted transfer to Nurse Triage: Reason for Disposition  [1] Caller requesting NON-URGENT health information AND [2] PCP's office is the best resource  Answer Assessment - Initial Assessment Questions 1. REASON FOR CALL or QUESTION: What is your reason for calling today? or How can I best help you? or What question do you have that I can help answer?      Pt states that she is needing help. States that she has no help and lives alone. States that she has not had anything to eat in almost 3 days, states that she has had fluids. Denies any neighbors or family. Gave the number to [facility].  She is checking on the referral for her home health services as she needs someone out there ASAP.  Protocols used: Information Only Call - No Triage-A-AH

## 2024-05-01 NOTE — Progress Notes (Signed)
 Letter printed and mailed.

## 2024-05-01 NOTE — Telephone Encounter (Signed)
 Letter printed and mailed.

## 2024-05-02 ENCOUNTER — Other Ambulatory Visit

## 2024-05-02 ENCOUNTER — Encounter

## 2024-05-02 DIAGNOSIS — Z5982 Transportation insecurity: Secondary | ICD-10-CM | POA: Diagnosis not present

## 2024-05-02 DIAGNOSIS — Z5941 Food insecurity: Secondary | ICD-10-CM | POA: Diagnosis not present

## 2024-05-02 DIAGNOSIS — S42295D Other nondisplaced fracture of upper end of left humerus, subsequent encounter for fracture with routine healing: Secondary | ICD-10-CM | POA: Diagnosis not present

## 2024-05-02 DIAGNOSIS — Z5986 Financial insecurity: Secondary | ICD-10-CM | POA: Diagnosis not present

## 2024-05-02 DIAGNOSIS — S42342D Displaced spiral fracture of shaft of humerus, left arm, subsequent encounter for fracture with routine healing: Secondary | ICD-10-CM | POA: Diagnosis not present

## 2024-05-03 ENCOUNTER — Telehealth: Payer: Self-pay

## 2024-05-03 NOTE — Telephone Encounter (Signed)
Awaiting Prior Auth 

## 2024-05-03 NOTE — Progress Notes (Signed)
 [facility] - Tailored Care Management Navigation   Thank you for contacting [facility] for Tailored Plan navigation.   Your patient, Kim Graham, is eligible to receive care management through a Medicaid tailored care management program [facility] . This patient is not eligible for services through [facility] because they will receive services through their Medicaid tailored care management program. Care Coordinator was unable to reach patient to provide their assigned tailored care manager and contact information listed above. Unable to provide information regarding assigned tailored care manager. The Care Coordinator called the patient to give her [facility]'s contact information. Patient's voicemail not set up. Unable to leave a voicemail. The patient does not have My Chart. The Care Coordinator sent the patient a letter in mail with [facility]'s information.    [facility] Care Coordinator         [address]; [place], [place] [address] l  [email or web address] l [phone]

## 2024-05-03 NOTE — Telephone Encounter (Signed)
 Copied from CRM [id number]. Topic: Clinical - Medical Advice >> May 03, 2024  9:04 AM Essie A wrote: Reason for CRM: Donzell Kings from [facility] called on behalf of the patient who was with her trying to find out when patient can expect home health care services to begin.  Patient has not had a bath and is wearing the same clothes for 2 weeks.  She has no one to help care for her while she is recuperating.  She is not eating and can't drive.  Her situation is urgent.  Please call [phone] ext [phone] to let Donzell know.  You can call the patient also at [phone].

## 2024-05-04 ENCOUNTER — Ambulatory Visit: Admitting: General Surgery

## 2024-05-04 NOTE — Telephone Encounter (Signed)
 Ok for E2C2 to review.  Attempted to reach patient to advise. [facility] [phone] has been trying to reach her to get her on their schedule however patient has not yet been reached. They have also stopped by her home as well and not been successful.   Also tried to return call to Donzell but had to leave a voicemail. If either call back please transfer to clinic.

## 2024-05-05 ENCOUNTER — Telehealth: Payer: Self-pay | Admitting: Licensed Clinical Social Worker

## 2024-05-05 ENCOUNTER — Encounter: Payer: Self-pay | Admitting: Licensed Clinical Social Worker

## 2024-05-09 DIAGNOSIS — I129 Hypertensive chronic kidney disease with stage 1 through stage 4 chronic kidney disease, or unspecified chronic kidney disease: Secondary | ICD-10-CM | POA: Diagnosis not present

## 2024-05-09 DIAGNOSIS — Z87440 Personal history of urinary (tract) infections: Secondary | ICD-10-CM | POA: Diagnosis not present

## 2024-05-09 DIAGNOSIS — Z87442 Personal history of urinary calculi: Secondary | ICD-10-CM | POA: Diagnosis not present

## 2024-05-09 DIAGNOSIS — Z7951 Long term (current) use of inhaled steroids: Secondary | ICD-10-CM | POA: Diagnosis not present

## 2024-05-09 DIAGNOSIS — N1831 Chronic kidney disease, stage 3a: Secondary | ICD-10-CM | POA: Diagnosis not present

## 2024-05-09 DIAGNOSIS — Z9882 Breast implant status: Secondary | ICD-10-CM | POA: Diagnosis not present

## 2024-05-09 DIAGNOSIS — Z87891 Personal history of nicotine dependence: Secondary | ICD-10-CM | POA: Diagnosis not present

## 2024-05-09 DIAGNOSIS — J449 Chronic obstructive pulmonary disease, unspecified: Secondary | ICD-10-CM | POA: Diagnosis not present

## 2024-05-09 DIAGNOSIS — I872 Venous insufficiency (chronic) (peripheral): Secondary | ICD-10-CM | POA: Diagnosis not present

## 2024-05-09 DIAGNOSIS — S42302K Unspecified fracture of shaft of humerus, left arm, subsequent encounter for fracture with nonunion: Secondary | ICD-10-CM | POA: Diagnosis not present

## 2024-05-09 DIAGNOSIS — Z9181 History of falling: Secondary | ICD-10-CM | POA: Diagnosis not present

## 2024-05-09 DIAGNOSIS — R7303 Prediabetes: Secondary | ICD-10-CM | POA: Diagnosis not present

## 2024-05-09 DIAGNOSIS — D696 Thrombocytopenia, unspecified: Secondary | ICD-10-CM | POA: Diagnosis not present

## 2024-05-09 DIAGNOSIS — W19XXXD Unspecified fall, subsequent encounter: Secondary | ICD-10-CM | POA: Diagnosis not present

## 2024-05-09 DIAGNOSIS — Z602 Problems related to living alone: Secondary | ICD-10-CM | POA: Diagnosis not present

## 2024-05-10 ENCOUNTER — Other Ambulatory Visit: Payer: Self-pay

## 2024-05-10 ENCOUNTER — Telehealth: Payer: Self-pay

## 2024-05-10 NOTE — Patient Outreach (Signed)
 Contacted staff at [facility] to inquire of personal care services [facility]) order for patient, if PCP should order or if [facility], Dr. Charlene, Ortho MD should order.    Background: spoke with patient today, she is needing PCS to assist with bathing /dressing, states [facility] is visiting but they aren't going to provide anyone to help with bathing/dressing.  She and I had conference call with her health insurance, [employer], rep states patient has a benefit for PCS.  PCP or ordering physician needs to contact [facility] to place an order. Their contact number is [phone], fax # [phone].  This RNCM contacted [facility] for guidance, rep emailed a form to be completed by ordering physician and faxed.

## 2024-05-10 NOTE — Patient Instructions (Signed)
 Visit Information  Thank you for taking time to visit with me today. Please don't hesitate to contact me if I can be of assistance to you before our next scheduled appointment.  Our next appointment is by telephone on Tuesday, July 8th at 12:45pm. Please call the care guide team at [phone] if you need to cancel or reschedule your appointment.   Following is a copy of your care plan:   Goals Addressed             This Visit's Progress    VBCI RN Care Plan       Problems:  Care Coordination needs related to Transportation and Personal Care Services   Goal: Over the next 15 days the Patient will demonstrate ongoing [place] health care management ability , get to healthcare appointments, safely bathe and dress, as evidenced by    able to call for transportation when needed, evaluation by [facility] for PCS.   Interventions:   Evaluation of current treatment plan related to SDOH needs, Transportation and Limited education about how to obtain PCS* Kim Graham-management and patient's adherence to plan as established by provider. Discussed plans with patient for ongoing care management follow up and provided patient with direct contact information for care management team Collaborated with Dr. Gaines/[facility] regarding order for PCS, called office, staff will forward message for return call.  Provided patient and/or caregiver with contact information about transportation available through her insurance company (conference call with patient and Medicaid) (community resource) Reviewed scheduled/upcoming provider appointments including : Ortho MD on 05/16/2024.  Screening for signs and symptoms of depression related to chronic disease state  Assessed social determinant of health barriers This RNCM contacted [facility] to inquire what is needed from ordering MD to order [facility] - rep sent email for required form - will forward to ordering MD>.  Patient Kim Graham Activities:  Attend all scheduled  provider appointments Call provider office for new concerns or questions  Take medications as prescribed   Call at least three days ahead for transportation.  Plan:  Telephone follow up appointment with care management team member scheduled for:  one week.          VBCI RN Care Plan       Problems:  Chronic Disease Management support and education needs related to Health Maintenance  Goal: Over the next 90 days the Patient will demonstrate ongoing [place] health care management ability for Health Maintenance as evidenced by    scheduling appointment for dental and vision.  Interventions:   Health Maintenance Interventions: Patient interviewed about adult health maintenance status including  Regular eye checkups Regular Dental Care     Patient Kim Graham-Care Activities:  Attend all scheduled provider appointments Call provider office for new concerns or questions  Take medications as prescribed   Patient states she will make appointments for dental and vision after she is stable with the fracture of her left arm.   Plan:  Telephone follow up appointment with care management team member scheduled for:  one week.             Please call the [place]  National Suicide Prevention Lifeline: (305)051-2226 or TTY: 2190963626 TTY 534-332-1477) to talk to a trained counselor if you are experiencing a Mental Health or Behavioral Health Crisis or need someone to talk to.  The patient verbalized understanding of instructions, educational materials, and care plan provided today and DECLINED offer to receive copy of patient instructions, educational materials, and care plan.   [employer],  CCM [place]  [facility] Population Health RN Care Manager Direct Dial: [phone]  Fax: [phone]

## 2024-05-10 NOTE — Telephone Encounter (Signed)
 Copied from CRM [id number]. Topic: Clinical - Medical Advice >> May 10, 2024 12:32 PM Nathanel BROCKS wrote: Reason for RMF:Jwpuj with [facility] returned Katies call in regards to this patient. Donzell did see her today. Donzell is checking on the home health care aide for her.

## 2024-05-10 NOTE — Patient Outreach (Signed)
 Complex Care Management   Visit Note  05/10/2024  Name:  Kim Graham MRN: [id number] DOB: 1951-08-28  Situation: Referral received for Complex Care Management related to SDOH Barriers:  Transportation Personal Care Services and fracture to left arm. I obtained verbal consent from Patient.  Visit completed with [name]  on the phone  Background:   Past Medical History:  Diagnosis Date   Acute cholecystitis 12/09/2021   Bile leak    Chronic kidney disease, stage 3a (HCC)    Chronic venous insufficiency    COPD (chronic obstructive pulmonary disease) (HCC)    Depression    Hep C w/o coma, chronic (HCC)    treated   History of kidney stones    Hypertension    Methadone  dependence (HCC)    Nephrolithiasis    Pre-diabetes    Sepsis secondary to UTI (HCC)    Thrombocytopenia (HCC)     Assessment: Patient Reported Symptoms:  Cognitive Cognitive Status: Alert and oriented to person, place, and time, Normal speech and language skills      Neurological Neurological Review of Symptoms: No symptoms reported    HEENT HEENT Symptoms Reported: No symptoms reported      Cardiovascular Cardiovascular Symptoms Reported: No symptoms reported Does patient have uncontrolled Hypertension?: No    Respiratory Respiratory Symptoms Reported: No symptoms reported    Endocrine Endocrine Symptoms Reported: No symptoms reported    Gastrointestinal Gastrointestinal Symptoms Reported: No symptoms reported      Genitourinary Genitourinary Symptoms Reported: No symptoms reported    Integumentary Integumentary Symptoms Reported: Other Other Integumentary Symptoms: Cyst removed 12/29/23, had f/u with Dr. Jayson Dennis/surgeon 3/6, on last f/u 5/15, wound instructions to cover with gauze pad.  Patient is currently covering with gauze pad, denies drainage/pain/odor.    Musculoskeletal Musculoskelatal Symptoms Reviewed: Other Other Musculoskeletal Symptoms: closed fracture of left humerus due to  fall week of 04/17/24, ED visit, she has been following up with Dr.Gaines/Ortho, last appt 6/24, next 05/16/24. Confirmed boyfriend will be taking her to appt.   Falls in the past year?: Yes Was there an injury with Fall?: Yes    Psychosocial       Quality of Family Relationships: helpful, supportive, involved Do you feel physically threatened by others?: No      05/10/2024    2:51 PM  Depression screen PHQ 2/9  Decreased Interest 0  Down, Depressed, Hopeless 2  PHQ - 2 Score 2  Altered sleeping 0  Tired, decreased energy 2  Change in appetite 0  Feeling bad or failure about yourself  1  Trouble concentrating 0  Moving slowly or fidgety/restless 0  Suicidal thoughts 0  PHQ-9 Score 5    There were no vitals filed for this visit.  Medications Reviewed Today     Reviewed by Lucian Santana LABOR, RN (Registered Nurse) on 05/10/24 at 1446  Med List Status: <None>   Medication Order Taking? Sig Documenting Provider Last Dose Status Informant  albuterol  (PROVENTIL ) (2.5 MG/3ML) 0.083% nebulizer solution [id number] Yes Take 3 mLs (2.5 mg total) by nebulization every 6 (six) hours as needed for wheezing or shortness of breath. Johnson, Megan P, DO  Active   albuterol  (VENTOLIN  HFA) 108 (90 Base) MCG/ACT inhaler [id number] Yes Inhale 2 puffs into the lungs every 6 (six) hours as needed for wheezing or shortness of breath. Johnson, Megan P, DO  Active   ARIPiprazole  (ABILIFY ) 30 MG tablet [id number]  Take 1 tablet (30 mg total) by mouth daily.  Patient not taking: Reported on 05/10/2024   Vicci Bouchard P, DO  Active   ascorbic acid  (VITAMIN C) 500 MG tablet [id number]  Take 500 mg by mouth daily.  Patient not taking: Reported on 05/10/2024   [provider]  Active Bessent  budesonide-glycopyrrolate -formoterol (BREZTRI  AEROSPHERE) 160-9-4.8 MCG/ACT AERO inhaler [id number] Yes Inhale 2 puffs into the lungs 2 (two) times daily. Johnson, Megan P, DO  Active   fluticasone  (FLONASE ) 50 MCG/ACT nasal  spray [id number]  Place 2 sprays into both nostrils daily.  Patient taking differently: Place 2 sprays into both nostrils daily as needed for allergies or rhinitis.   Thaddeus Hyla Givens, NP  Active Long  furosemide  (LASIX ) 20 MG tablet [id number]  Take 1 tablet (20 mg total) by mouth daily.  Patient not taking: Reported on 05/10/2024   Vicci Bouchard SQUIBB, DO  Active   methadone  (DOLOPHINE ) 10 MG/ML solution [id number] Yes Take 100 mg by mouth daily. [provider]  Active Grefe           Med Note CATHY OVAL DEL   Wed May 20, 2022  8:40 AM) Patient participates at [facility] in [place], [place]. Her daily dose is 100 mg of liquid Methadone , concentration is 10 mg /mL. She takes it daily at 0700.  Multiple Vitamin (MULTI-VITAMIN) tablet [id number] Yes Take 1 tablet by mouth daily. [provider]  Active Rothbauer  mupirocin  ointment (BACTROBAN ) 2 % [id number]  Apply 1 Application topically 2 (two) times daily. Apply to areas of abrasions on lower legs.  Patient not taking: Reported on 05/10/2024   Cannady, Jolene T, NP  Active   ondansetron  (ZOFRAN ) 4 MG tablet [id number]  Take 1 tablet (4 mg total) by mouth every 8 (eight) hours as needed for nausea or vomiting. Thaddeus Hyla Givens, NP  Active [place]  Spacer/Aero-Holding Raguel FRENCH [id number] Yes Use as directed Dx: COPD Vicci Bouchard SQUIBB, DO  Active [place]            Recommendation:   -Specialty provider follow-up : Dr. Charlene Beers at [facility] on 05/16/2024 Home Health requests: Personal Care Services - message left with [facility], Dr. Charlene, for [facility] order.  -Patient will continue to wear splint and sling, can take off SLING when showering but needs to keep splint in place and cover splint to keep dry.   -Patient will call Ortho MD if pain increases in left arm, will watch for redness/swelling.   Follow Up Plan:   Telephone follow-up in 1 week  Santana Stamp BSN, CCM [place]  [facility]  Population Health RN Care Manager Direct Dial: [phone]  Fax: [phone]

## 2024-05-10 NOTE — Patient Outreach (Signed)
 Attempting to contact [facility] home health agency to inquire of personal care services/assist with bathing for patient,/how often will CNA visit.  Left message for return call with my contact info.

## 2024-05-11 NOTE — Telephone Encounter (Signed)
 Copied from CRM [id number]. Topic: General - Call Back - No Documentation >> May 10, 2024  4:22 PM Geneva B wrote: Reason for CRM: [facility] calling about patient please call back  [phone]

## 2024-05-15 DIAGNOSIS — Z87440 Personal history of urinary (tract) infections: Secondary | ICD-10-CM | POA: Diagnosis not present

## 2024-05-15 DIAGNOSIS — I872 Venous insufficiency (chronic) (peripheral): Secondary | ICD-10-CM | POA: Diagnosis not present

## 2024-05-15 DIAGNOSIS — Z87442 Personal history of urinary calculi: Secondary | ICD-10-CM | POA: Diagnosis not present

## 2024-05-15 DIAGNOSIS — N1831 Chronic kidney disease, stage 3a: Secondary | ICD-10-CM | POA: Diagnosis not present

## 2024-05-15 DIAGNOSIS — Z9882 Breast implant status: Secondary | ICD-10-CM | POA: Diagnosis not present

## 2024-05-15 DIAGNOSIS — W19XXXD Unspecified fall, subsequent encounter: Secondary | ICD-10-CM | POA: Diagnosis not present

## 2024-05-15 DIAGNOSIS — I129 Hypertensive chronic kidney disease with stage 1 through stage 4 chronic kidney disease, or unspecified chronic kidney disease: Secondary | ICD-10-CM | POA: Diagnosis not present

## 2024-05-15 DIAGNOSIS — J449 Chronic obstructive pulmonary disease, unspecified: Secondary | ICD-10-CM | POA: Diagnosis not present

## 2024-05-15 DIAGNOSIS — Z602 Problems related to living alone: Secondary | ICD-10-CM | POA: Diagnosis not present

## 2024-05-15 DIAGNOSIS — S42302K Unspecified fracture of shaft of humerus, left arm, subsequent encounter for fracture with nonunion: Secondary | ICD-10-CM | POA: Diagnosis not present

## 2024-05-15 DIAGNOSIS — Z9181 History of falling: Secondary | ICD-10-CM | POA: Diagnosis not present

## 2024-05-15 DIAGNOSIS — Z7951 Long term (current) use of inhaled steroids: Secondary | ICD-10-CM | POA: Diagnosis not present

## 2024-05-15 DIAGNOSIS — D696 Thrombocytopenia, unspecified: Secondary | ICD-10-CM | POA: Diagnosis not present

## 2024-05-15 DIAGNOSIS — Z87891 Personal history of nicotine dependence: Secondary | ICD-10-CM | POA: Diagnosis not present

## 2024-05-15 DIAGNOSIS — R7303 Prediabetes: Secondary | ICD-10-CM | POA: Diagnosis not present

## 2024-05-16 ENCOUNTER — Other Ambulatory Visit

## 2024-05-16 DIAGNOSIS — W19XXXA Unspecified fall, initial encounter: Secondary | ICD-10-CM | POA: Diagnosis not present

## 2024-05-16 DIAGNOSIS — M25559 Pain in unspecified hip: Secondary | ICD-10-CM | POA: Diagnosis not present

## 2024-05-16 DIAGNOSIS — R0902 Hypoxemia: Secondary | ICD-10-CM | POA: Diagnosis not present

## 2024-05-16 DIAGNOSIS — I959 Hypotension, unspecified: Secondary | ICD-10-CM | POA: Diagnosis not present

## 2024-05-17 ENCOUNTER — Inpatient Hospital Stay
Admission: EM | Admit: 2024-05-17 | Discharge: 2024-05-25 | DRG: 565 | Disposition: A | Discharge status: Skilled Nursing Facility | Attending: Student | Admitting: Student

## 2024-05-17 ENCOUNTER — Other Ambulatory Visit: Payer: Self-pay

## 2024-05-17 ENCOUNTER — Emergency Department

## 2024-05-17 ENCOUNTER — Encounter: Payer: Self-pay | Admitting: Emergency Medicine

## 2024-05-17 ENCOUNTER — Observation Stay

## 2024-05-17 DIAGNOSIS — G5632 Lesion of radial nerve, left upper limb: Secondary | ICD-10-CM | POA: Diagnosis present

## 2024-05-17 DIAGNOSIS — L03115 Cellulitis of right lower limb: Secondary | ICD-10-CM | POA: Diagnosis present

## 2024-05-17 DIAGNOSIS — S42295A Other nondisplaced fracture of upper end of left humerus, initial encounter for closed fracture: Secondary | ICD-10-CM | POA: Diagnosis not present

## 2024-05-17 DIAGNOSIS — R590 Localized enlarged lymph nodes: Secondary | ICD-10-CM | POA: Diagnosis not present

## 2024-05-17 DIAGNOSIS — R7989 Other specified abnormal findings of blood chemistry: Secondary | ICD-10-CM

## 2024-05-17 DIAGNOSIS — N1831 Chronic kidney disease, stage 3a: Secondary | ICD-10-CM | POA: Diagnosis present

## 2024-05-17 DIAGNOSIS — E559 Vitamin D deficiency, unspecified: Secondary | ICD-10-CM | POA: Diagnosis not present

## 2024-05-17 DIAGNOSIS — Z87891 Personal history of nicotine dependence: Secondary | ICD-10-CM

## 2024-05-17 DIAGNOSIS — L0390 Cellulitis, unspecified: Secondary | ICD-10-CM

## 2024-05-17 DIAGNOSIS — Z87442 Personal history of urinary calculi: Secondary | ICD-10-CM | POA: Diagnosis not present

## 2024-05-17 DIAGNOSIS — R296 Repeated falls: Secondary | ICD-10-CM | POA: Diagnosis present

## 2024-05-17 DIAGNOSIS — R7401 Elevation of levels of liver transaminase levels: Secondary | ICD-10-CM | POA: Diagnosis present

## 2024-05-17 DIAGNOSIS — Z9181 History of falling: Secondary | ICD-10-CM | POA: Diagnosis not present

## 2024-05-17 DIAGNOSIS — Z8249 Family history of ischemic heart disease and other diseases of the circulatory system: Secondary | ICD-10-CM | POA: Diagnosis not present

## 2024-05-17 DIAGNOSIS — F32A Depression, unspecified: Secondary | ICD-10-CM | POA: Diagnosis present

## 2024-05-17 DIAGNOSIS — Z043 Encounter for examination and observation following other accident: Secondary | ICD-10-CM | POA: Diagnosis not present

## 2024-05-17 DIAGNOSIS — Z7951 Long term (current) use of inhaled steroids: Secondary | ICD-10-CM | POA: Diagnosis not present

## 2024-05-17 DIAGNOSIS — Z888 Allergy status to other drugs, medicaments and biological substances status: Secondary | ICD-10-CM | POA: Diagnosis not present

## 2024-05-17 DIAGNOSIS — J449 Chronic obstructive pulmonary disease, unspecified: Secondary | ICD-10-CM | POA: Diagnosis present

## 2024-05-17 DIAGNOSIS — Z8781 Personal history of (healed) traumatic fracture: Secondary | ICD-10-CM | POA: Diagnosis not present

## 2024-05-17 DIAGNOSIS — E119 Type 2 diabetes mellitus without complications: Secondary | ICD-10-CM

## 2024-05-17 DIAGNOSIS — S42342D Displaced spiral fracture of shaft of humerus, left arm, subsequent encounter for fracture with routine healing: Secondary | ICD-10-CM | POA: Diagnosis not present

## 2024-05-17 DIAGNOSIS — W19XXXA Unspecified fall, initial encounter: Principal | ICD-10-CM | POA: Diagnosis present

## 2024-05-17 DIAGNOSIS — G894 Chronic pain syndrome: Secondary | ICD-10-CM | POA: Diagnosis not present

## 2024-05-17 DIAGNOSIS — F1120 Opioid dependence, uncomplicated: Secondary | ICD-10-CM | POA: Diagnosis present

## 2024-05-17 DIAGNOSIS — Y92009 Unspecified place in unspecified non-institutional (private) residence as the place of occurrence of the external cause: Secondary | ICD-10-CM | POA: Diagnosis not present

## 2024-05-17 DIAGNOSIS — M6281 Muscle weakness (generalized): Secondary | ICD-10-CM | POA: Diagnosis not present

## 2024-05-17 DIAGNOSIS — L03116 Cellulitis of left lower limb: Secondary | ICD-10-CM | POA: Diagnosis not present

## 2024-05-17 DIAGNOSIS — M6282 Rhabdomyolysis: Secondary | ICD-10-CM | POA: Diagnosis present

## 2024-05-17 DIAGNOSIS — Z5982 Transportation insecurity: Secondary | ICD-10-CM

## 2024-05-17 DIAGNOSIS — D696 Thrombocytopenia, unspecified: Secondary | ICD-10-CM | POA: Diagnosis not present

## 2024-05-17 DIAGNOSIS — E1122 Type 2 diabetes mellitus with diabetic chronic kidney disease: Secondary | ICD-10-CM | POA: Diagnosis present

## 2024-05-17 DIAGNOSIS — R2689 Other abnormalities of gait and mobility: Secondary | ICD-10-CM | POA: Diagnosis not present

## 2024-05-17 DIAGNOSIS — I129 Hypertensive chronic kidney disease with stage 1 through stage 4 chronic kidney disease, or unspecified chronic kidney disease: Secondary | ICD-10-CM | POA: Diagnosis present

## 2024-05-17 DIAGNOSIS — R6889 Other general symptoms and signs: Secondary | ICD-10-CM | POA: Diagnosis not present

## 2024-05-17 DIAGNOSIS — Z7401 Bed confinement status: Secondary | ICD-10-CM | POA: Diagnosis not present

## 2024-05-17 DIAGNOSIS — S42302K Unspecified fracture of shaft of humerus, left arm, subsequent encounter for fracture with nonunion: Secondary | ICD-10-CM | POA: Diagnosis not present

## 2024-05-17 DIAGNOSIS — T796XXA Traumatic ischemia of muscle, initial encounter: Principal | ICD-10-CM | POA: Diagnosis present

## 2024-05-17 DIAGNOSIS — S42202A Unspecified fracture of upper end of left humerus, initial encounter for closed fracture: Secondary | ICD-10-CM | POA: Diagnosis present

## 2024-05-17 DIAGNOSIS — F339 Major depressive disorder, recurrent, unspecified: Secondary | ICD-10-CM | POA: Diagnosis not present

## 2024-05-17 DIAGNOSIS — G8929 Other chronic pain: Secondary | ICD-10-CM | POA: Diagnosis present

## 2024-05-17 DIAGNOSIS — I10 Essential (primary) hypertension: Secondary | ICD-10-CM | POA: Diagnosis not present

## 2024-05-17 DIAGNOSIS — L89154 Pressure ulcer of sacral region, stage 4: Secondary | ICD-10-CM | POA: Diagnosis not present

## 2024-05-17 DIAGNOSIS — Z833 Family history of diabetes mellitus: Secondary | ICD-10-CM | POA: Diagnosis not present

## 2024-05-17 DIAGNOSIS — Z79899 Other long term (current) drug therapy: Secondary | ICD-10-CM | POA: Diagnosis not present

## 2024-05-17 DIAGNOSIS — E876 Hypokalemia: Secondary | ICD-10-CM | POA: Diagnosis present

## 2024-05-17 DIAGNOSIS — M79632 Pain in left forearm: Secondary | ICD-10-CM | POA: Diagnosis not present

## 2024-05-17 DIAGNOSIS — Z743 Need for continuous supervision: Secondary | ICD-10-CM | POA: Diagnosis not present

## 2024-05-17 DIAGNOSIS — R7303 Prediabetes: Secondary | ICD-10-CM | POA: Diagnosis not present

## 2024-05-17 DIAGNOSIS — I959 Hypotension, unspecified: Secondary | ICD-10-CM | POA: Diagnosis not present

## 2024-05-17 DIAGNOSIS — I872 Venous insufficiency (chronic) (peripheral): Secondary | ICD-10-CM | POA: Diagnosis present

## 2024-05-17 DIAGNOSIS — L89316 Pressure-induced deep tissue damage of right buttock: Secondary | ICD-10-CM | POA: Diagnosis not present

## 2024-05-17 DIAGNOSIS — B182 Chronic viral hepatitis C: Secondary | ICD-10-CM | POA: Diagnosis present

## 2024-05-17 DIAGNOSIS — M85822 Other specified disorders of bone density and structure, left upper arm: Secondary | ICD-10-CM | POA: Diagnosis not present

## 2024-05-17 DIAGNOSIS — S42342A Displaced spiral fracture of shaft of humerus, left arm, initial encounter for closed fracture: Secondary | ICD-10-CM | POA: Diagnosis present

## 2024-05-17 DIAGNOSIS — L539 Erythematous condition, unspecified: Secondary | ICD-10-CM | POA: Diagnosis not present

## 2024-05-17 DIAGNOSIS — D509 Iron deficiency anemia, unspecified: Secondary | ICD-10-CM | POA: Diagnosis not present

## 2024-05-17 DIAGNOSIS — N390 Urinary tract infection, site not specified: Secondary | ICD-10-CM | POA: Diagnosis not present

## 2024-05-17 DIAGNOSIS — W19XXXD Unspecified fall, subsequent encounter: Secondary | ICD-10-CM | POA: Diagnosis not present

## 2024-05-17 DIAGNOSIS — S42342S Displaced spiral fracture of shaft of humerus, left arm, sequela: Secondary | ICD-10-CM | POA: Diagnosis not present

## 2024-05-17 LAB — URINALYSIS, ROUTINE W REFLEX MICROSCOPIC
Bilirubin Urine: NEGATIVE
Glucose, UA: NEGATIVE mg/dL
Ketones, ur: 5 mg/dL — AB
Nitrite: NEGATIVE
Protein, ur: 100 mg/dL — AB
Specific Gravity, Urine: 1.019 (ref 1.005–1.030)
pH: 6 (ref 5.0–8.0)

## 2024-05-17 LAB — COMPREHENSIVE METABOLIC PANEL WITH GFR
ALT: 64 U/L — ABNORMAL HIGH (ref 0–44)
ALT: 73 U/L — ABNORMAL HIGH (ref 0–44)
AST: 219 U/L — ABNORMAL HIGH (ref 15–41)
AST: 236 U/L — ABNORMAL HIGH (ref 15–41)
Albumin: 2.4 g/dL — ABNORMAL LOW (ref 3.5–5.0)
Albumin: 2.5 g/dL — ABNORMAL LOW (ref 3.5–5.0)
Alkaline Phosphatase: 60 U/L (ref 38–126)
Alkaline Phosphatase: 59 U/L (ref 38–126)
Anion gap: 8 (ref 5–15)
Anion gap: 9 (ref 5–15)
BUN: 14 mg/dL (ref 8–23)
BUN: 15 mg/dL (ref 8–23)
CO2: 24 mmol/L (ref 22–32)
CO2: 27 mmol/L (ref 22–32)
Calcium: 7.1 mg/dL — ABNORMAL LOW (ref 8.9–10.3)
Calcium: 8.2 mg/dL — ABNORMAL LOW (ref 8.9–10.3)
Chloride: 112 mmol/L — ABNORMAL HIGH (ref 98–111)
Chloride: 106 mmol/L (ref 98–111)
Creatinine, Ser: 0.71 mg/dL (ref 0.44–1.00)
Creatinine, Ser: 0.79 mg/dL (ref 0.44–1.00)
GFR, Estimated: >60 mL/min (ref >60)
GFR, Estimated: >60 mL/min (ref >60)
Glucose, Bld: 89 mg/dL (ref 70–99)
Glucose, Bld: 89 mg/dL (ref 70–99)
Potassium: 2.5 mmol/L — CL (ref 3.5–5.1)
Potassium: 3 mmol/L — ABNORMAL LOW (ref 3.5–5.1)
Sodium: 144 mmol/L (ref 135–145)
Sodium: 142 mmol/L (ref 135–145)
Total Bilirubin: 1 mg/dL (ref 0.0–1.2)
Total Bilirubin: 0.8 mg/dL (ref 0.0–1.2)
Total Protein: 4.7 g/dL — ABNORMAL LOW (ref 6.5–8.1)
Total Protein: 5.1 g/dL — ABNORMAL LOW (ref 6.5–8.1)

## 2024-05-17 LAB — CBC WITH DIFFERENTIAL/PLATELET
Abs Immature Granulocytes: 0.04 K/uL (ref 0.00–0.07)
Basophils Absolute: 0 K/uL (ref 0.0–0.1)
Basophils Relative: 1 %
Eosinophils Absolute: 0.1 K/uL (ref 0.0–0.5)
Eosinophils Relative: 1 %
HCT: 40.7 % (ref 36.0–46.0)
Hemoglobin: 12.8 g/dL (ref 12.0–15.0)
Immature Granulocytes: 1 %
Lymphocytes Relative: 22 %
Lymphs Abs: 1.5 K/uL (ref 0.7–4.0)
MCH: 29.6 pg (ref 26.0–34.0)
MCHC: 31.4 g/dL (ref 30.0–36.0)
MCV: 94 fL (ref 80.0–100.0)
Monocytes Absolute: 0.8 K/uL (ref 0.1–1.0)
Monocytes Relative: 11 %
Neutro Abs: 4.2 K/uL (ref 1.7–7.7)
Neutrophils Relative %: 64 %
Platelets: 158 K/uL (ref 150–400)
RBC: 4.33 MIL/uL (ref 3.87–5.11)
RDW: 16.1 % — ABNORMAL HIGH (ref 11.5–15.5)
WBC: 6.6 K/uL (ref 4.0–10.5)
nRBC: 0 % (ref 0.0–0.2)

## 2024-05-17 LAB — TROPONIN I (HIGH SENSITIVITY)
Troponin I (High Sensitivity): 56 ng/L — ABNORMAL HIGH (ref <18)
Troponin I (High Sensitivity): 22 ng/L — ABNORMAL HIGH (ref <18)

## 2024-05-17 LAB — CK: Total CK: 7623 U/L — ABNORMAL HIGH (ref 38–234)

## 2024-05-17 LAB — MAGNESIUM
Magnesium: 1.5 mg/dL — ABNORMAL LOW (ref 1.7–2.4)
Magnesium: 1.8 mg/dL (ref 1.7–2.4)

## 2024-05-17 MED ORDER — POTASSIUM CHLORIDE 10 MEQ/100ML IV SOLN
10.0000 meq | INTRAVENOUS | Status: AC
  Administered 2024-05-17: 10 meq via INTRAVENOUS
  Filled 2024-05-17 (×2): qty 100

## 2024-05-17 MED ORDER — ENOXAPARIN SODIUM 40 MG/0.4ML IJ SOSY
40.0000 mg | PREFILLED_SYRINGE | INTRAMUSCULAR | Status: DC
  Administered 2024-05-17 – 2024-05-25 (×9): 40 mg via SUBCUTANEOUS
  Filled 2024-05-17 (×9): qty 0.4

## 2024-05-17 MED ORDER — METHADONE HCL 10 MG/ML PO CONC: 100.0000 mg | Freq: Every day | ORAL | Status: DC

## 2024-05-17 MED ORDER — SODIUM CHLORIDE 0.9 % IV SOLN
2.0000 g | Freq: Once | INTRAVENOUS | Status: AC
  Administered 2024-05-17: 2 g via INTRAVENOUS
  Filled 2024-05-17: qty 20

## 2024-05-17 MED ORDER — POTASSIUM CHLORIDE CRYS ER 20 MEQ PO TBCR
40.0000 meq | EXTENDED_RELEASE_TABLET | Freq: Once | ORAL | Status: AC
  Administered 2024-05-17: 40 meq via ORAL
  Filled 2024-05-17: qty 2

## 2024-05-17 MED ORDER — MAGNESIUM OXIDE -MG SUPPLEMENT 400 (240 MG) MG PO TABS
800.0000 mg | ORAL_TABLET | Freq: Once | ORAL | Status: AC
  Administered 2024-05-17: 800 mg via ORAL
  Filled 2024-05-17: qty 2

## 2024-05-17 MED ORDER — ARIPIPRAZOLE 15 MG PO TABS
30.0000 mg | ORAL_TABLET | Freq: Every day | ORAL | Status: DC
  Administered 2024-05-17 – 2024-05-25 (×9): 30 mg via ORAL
  Filled 2024-05-17 (×9): qty 2

## 2024-05-17 MED ORDER — ALBUTEROL SULFATE (2.5 MG/3ML) 0.083% IN NEBU: 2.5000 mg | INHALATION_SOLUTION | Freq: Four times a day (QID) | RESPIRATORY_TRACT | Status: DC | PRN

## 2024-05-17 MED ORDER — METHADONE HCL 10 MG/ML PO CONC
100.0000 mg | Freq: Every day | ORAL | Status: DC
  Administered 2024-05-17 – 2024-05-25 (×9): 100 mg via ORAL
  Filled 2024-05-17 (×10): qty 10

## 2024-05-17 MED ORDER — BUDESON-GLYCOPYRROL-FORMOTEROL 160-9-4.8 MCG/ACT IN AERO
2.0000 | INHALATION_SPRAY | Freq: Two times a day (BID) | RESPIRATORY_TRACT | Status: DC
  Administered 2024-05-17 – 2024-05-25 (×15): 2 via RESPIRATORY_TRACT
  Filled 2024-05-17 (×5): qty 5.9

## 2024-05-17 MED ORDER — OXYCODONE HCL 5 MG PO TABS
5.0000 mg | ORAL_TABLET | ORAL | Status: DC | PRN
  Administered 2024-05-18 – 2024-05-22 (×6): 5 mg via ORAL
  Filled 2024-05-17 (×6): qty 1

## 2024-05-17 MED ORDER — OXYCODONE HCL 5 MG PO TABS
5.0000 mg | ORAL_TABLET | Freq: Once | ORAL | Status: AC
  Administered 2024-05-17: 5 mg via ORAL
  Filled 2024-05-17: qty 1

## 2024-05-17 MED ORDER — SODIUM CHLORIDE 0.9 % IV BOLUS
1000.0000 mL | Freq: Once | INTRAVENOUS | Status: AC
  Administered 2024-05-17: 1000 mL via INTRAVENOUS

## 2024-05-17 MED ORDER — ACETAMINOPHEN 325 MG PO TABS
650.0000 mg | ORAL_TABLET | Freq: Four times a day (QID) | ORAL | Status: DC | PRN
Start: 2024-05-17 — End: 2024-05-25
  Administered 2024-05-19 – 2024-05-24 (×5): 650 mg via ORAL
  Filled 2024-05-17 (×5): qty 2

## 2024-05-17 MED ORDER — SODIUM CHLORIDE 0.9 % IV SOLN: INTRAVENOUS | Status: AC

## 2024-05-17 MED ORDER — ONDANSETRON HCL 4 MG PO TABS
4.0000 mg | ORAL_TABLET | Freq: Four times a day (QID) | ORAL | Status: DC | PRN
Start: 2024-05-17 — End: 2024-05-25

## 2024-05-17 MED ORDER — SODIUM CHLORIDE 0.9 % IV SOLN
1.0000 g | INTRAVENOUS | Status: DC
  Administered 2024-05-18 – 2024-05-24 (×7): 1 g via INTRAVENOUS
  Filled 2024-05-17 (×7): qty 10

## 2024-05-17 MED ORDER — ONDANSETRON HCL 4 MG/2ML IJ SOLN: 4.0000 mg | Freq: Four times a day (QID) | INTRAMUSCULAR | Status: DC | PRN

## 2024-05-17 MED ORDER — MORPHINE SULFATE (PF) 2 MG/ML IV SOLN
2.0000 mg | INTRAVENOUS | Status: DC | PRN
  Administered 2024-05-17 – 2024-05-21 (×4): 2 mg via INTRAVENOUS
  Filled 2024-05-17 (×4): qty 1

## 2024-05-17 MED ORDER — ACETAMINOPHEN 650 MG RE SUPP: 650.0000 mg | Freq: Four times a day (QID) | RECTAL | Status: DC | PRN

## 2024-05-17 NOTE — Assessment & Plan Note (Signed)
 History of fall on 04/20/2024 with humeral fracture PT eval

## 2024-05-17 NOTE — Assessment & Plan Note (Addendum)
 Pain and swelling left hand Patient was in a long-arm splint that was removed in the ED Has not yet followed up with Ortho X-ray of the humerus showing no acute findings Consider Ortho consult

## 2024-05-17 NOTE — Assessment & Plan Note (Signed)
 History of hepatitis C s/p treatment Suspect secondary to rhabdo Monitor for improvement with hydration

## 2024-05-17 NOTE — H&P (Addendum)
 History and Physical    Patient: Kim Graham FMW:[id number] DOB: [birth year] DOA: 05/17/2024 DOS: the patient was seen and examined on 05/17/2024 PCP: Vicci Duwaine SQUIBB, DO  Patient coming from: Home  Chief Complaint:  Chief Complaint  Patient presents with   Fall    HPI: Kim Graham is a 73 y.o. female with medical history significant for chronic pain , COPD, DM II, history of HCV s/p treatment , currently in a left arm splint for humeral fracture from fall on 04/20/2024, being admitted with traumatic rhabdomyolysis resulting from a fall on 7/7 around lunchtime following which she laid on the floor for about 5 hours.  She was helped up to her recliner where she has been sleeping since her humeral fracture but states when she awoke on the morning of 7/8 she was hurting in her hips and felt like she could not walk thus prompting the visit to the ED.  She was previously in her usual state of health and states her knees just gave out and she fell.  She did not hit her head and did not lose consciousness.  She reports pain in her pelvis, and chronic swelling and pain in her left hand since her humeral fracture.  She was also noted to have an area of redness of the right lower leg however denies pain in the area. In the ED she was mildly tachypneic to 22 with otherwise normal vitals. Labs notable for CK 7623, electrolyte abnormalities with potassium 2.5 and magnesium  1.5, troponin 56, and transaminitis with AST 219 and ALT 64 with normal bilirubin.  Urinalysis pending. EKG showed NSR at 92 with T wave inversion in inferior leads Trauma imaging including x-rays of the left forearm and hand, right tib-fib, left humerus and hips were all nonacute.  Patient refused CT head insisting that she did not hit her head(per EDP). Patient treated with an NS bolus, oxycodone , IV potassium.  She was also started on Rocephin  for possible cellulitis of the right lower extremity. Admission requested.     Review of  Systems: As mentioned in the history of present illness. All other systems reviewed and are negative.  Past Medical History:  Diagnosis Date   Acute cholecystitis 12/09/2021   Bile leak    Chronic kidney disease, stage 3a (HCC)    Chronic venous insufficiency    COPD (chronic obstructive pulmonary disease) (HCC)    Depression    Hep C w/o coma, chronic (HCC)    treated   History of kidney stones    Hypertension    Methadone  dependence (HCC)    Nephrolithiasis    Pre-diabetes    Sepsis secondary to UTI (HCC)    Thrombocytopenia (HCC)    Past Surgical History:  Procedure Laterality Date   CYSTOSCOPY W/ URETERAL STENT PLACEMENT Right 12/09/2021   Procedure: CYSTOSCOPY WITH RETROGRADE PYELOGRAM/URETERAL STENT PLACEMENT;  Surgeon: Twylla Glendia BROCKS, MD;  Location: [facility];  Service: Urology;  Laterality: Right;   CYSTOSCOPY/URETEROSCOPY/HOLMIUM LASER/STENT PLACEMENT Bilateral 02/11/2022   Procedure: CYSTOSCOPY/URETEROSCOPY/HOLMIUM LASER/STENT PLACEMENT & RIGHT URETERAL STENT EXCHANGE;  Surgeon: Twylla Glendia BROCKS, MD;  Location: [facility];  Service: Urology;  Laterality: Bilateral;   CYSTOSCOPY/URETEROSCOPY/HOLMIUM LASER/STENT PLACEMENT Bilateral 05/19/2022   Procedure: CYSTOSCOPY/URETEROSCOPY/HOLMIUM LASER/STENT PLACEMENT;  Surgeon: Twylla Glendia BROCKS, MD;  Location: [facility];  Service: Urology;  Laterality: Bilateral;   ENDOSCOPIC RETROGRADE CHOLANGIOPANCREATOGRAPHY (ERCP) WITH PROPOFOL  N/A 12/11/2021   Procedure: ENDOSCOPIC RETROGRADE CHOLANGIOPANCREATOGRAPHY (ERCP) WITH PROPOFOL ;  Surgeon: Jinny Carmine, MD;  Location: [facility];  Service: Endoscopy;  Laterality: N/A;   ERCP N/A 03/10/2022   Procedure: ENDOSCOPIC RETROGRADE CHOLANGIOPANCREATOGRAPHY (ERCP);  Surgeon: Jinny Carmine, MD;  Location: [facility] ENDOSCOPY;  Service: Endoscopy;  Laterality: N/A;  Stent removal   ERCP N/A 06/23/2022   Procedure: ENDOSCOPIC RETROGRADE CHOLANGIOPANCREATOGRAPHY (ERCP);  Surgeon: Jinny Carmine, MD;   Location: [facility] ENDOSCOPY;  Service: Endoscopy;  Laterality: N/A;  stent removal   PILONIDAL CYST EXCISION N/A 12/29/2023   Procedure: CYST EXCISION PILONIDAL EXTENSIVE;  Surgeon: Marinda Jayson KIDD, MD;  Location: [facility];  Service: General;  Laterality: N/A;   PLACEMENT OF BREAST IMPLANTS Bilateral    TONSILLECTOMY     Social History:  reports that she quit smoking about 5 years ago. Her smoking use included cigarettes. She started smoking about 54 years ago. She has a 24.5 pack-year smoking history. She has been exposed to tobacco smoke. She has never used smokeless tobacco. She reports that she does not currently use alcohol. She reports that she does not currently use drugs after having used the following drugs: Heroin.  Allergies  Allergen Reactions   Doxycycline  Diarrhea    Very bad diarrhea    Family History  Problem Relation Age of Onset   Diabetes Mother    Cancer Father        Pancreatic   Diabetes Maternal Aunt    Diabetes Maternal Grandmother    Dementia Maternal Grandfather    Heart disease Maternal Grandfather     Prior to Admission medications   Medication Sig Start Date End Date Taking? Authorizing Provider  albuterol  (PROVENTIL ) (2.5 MG/3ML) 0.083% nebulizer solution Take 3 mLs (2.5 mg total) by nebulization every 6 (six) hours as needed for wheezing or shortness of breath. 04/28/24   Vicci, Megan P, DO  albuterol  (VENTOLIN  HFA) 108 (90 Base) MCG/ACT inhaler Inhale 2 puffs into the lungs every 6 (six) hours as needed for wheezing or shortness of breath. 04/28/24   Johnson, Megan P, DO  ARIPiprazole  (ABILIFY ) 30 MG tablet Take 1 tablet (30 mg total) by mouth daily. Patient not taking: Reported on 05/10/2024 04/28/24   Vicci Bouchard P, DO  ascorbic acid  (VITAMIN C) 500 MG tablet Take 500 mg by mouth daily. Patient not taking: Reported on 05/10/2024    [provider]  budesonide -glycopyrrolate -formoterol  (BREZTRI  AEROSPHERE) 160-9-4.8 MCG/ACT AERO inhaler Inhale 2  puffs into the lungs 2 (two) times daily. 04/28/24   Johnson, Megan P, DO  fluticasone  (FLONASE ) 50 MCG/ACT nasal spray Place 2 sprays into both nostrils daily. 09/21/23   Pearley, Hyla Givens, NP  furosemide  (LASIX ) 20 MG tablet Take 1 tablet (20 mg total) by mouth daily. Patient not taking: Reported on 05/10/2024 02/25/24   Vicci Bouchard P, DO  methadone  (DOLOPHINE ) 10 MG/ML solution Take 100 mg by mouth daily.    [provider]  Multiple Vitamin (MULTI-VITAMIN) tablet Take 1 tablet by mouth daily.    [provider]  mupirocin  ointment (BACTROBAN ) 2 % Apply 1 Application topically 2 (two) times daily. Apply to areas of abrasions on lower legs. Patient not taking: Reported on 05/10/2024 01/24/24   Cannady, Jolene T, NP  ondansetron  (ZOFRAN ) 4 MG tablet Take 1 tablet (4 mg total) by mouth every 8 (eight) hours as needed for nausea or vomiting. 09/14/23   Thaddeus Hyla Givens, NP  Spacer/Aero-Holding [facility] Use as directed Dx: COPD 11/24/23   Vicci Bouchard SQUIBB, DO    Physical Exam: Vitals:   05/17/24 0025  BP: 134/68  Pulse: 96  Resp: ROLLEN)  22  Temp: 98.7 F (37.1 C)  TempSrc: Oral  SpO2: 95%   Physical Exam Vitals and nursing note reviewed.  Constitutional:      General: She is not in acute distress. HENT:     Head: Normocephalic and atraumatic.  Cardiovascular:     Rate and Rhythm: Normal rate and regular rhythm.     Heart sounds: Normal heart sounds.  Pulmonary:     Effort: Pulmonary effort is normal.     Breath sounds: Normal breath sounds.  Abdominal:     Palpations: Abdomen is soft.     Tenderness: There is no abdominal tenderness.  Musculoskeletal:     Left hand: Swelling and tenderness present. Decreased strength.     Comments: See pictures  Neurological:     Mental Status: Mental status is at baseline.        Labs on Admission: I have personally reviewed following labs and imaging studies  CBC: Recent Labs  Lab 05/17/24 0059  WBC  6.6  NEUTROABS 4.2  HGB 12.8  HCT 40.7  MCV 94.0  PLT 158   Basic Metabolic Panel: Recent Labs  Lab 05/17/24 0059  NA 144  K 2.5*  CL 112*  CO2 24  GLUCOSE 89  BUN 14  CREATININE 0.71  CALCIUM 7.1*  MG 1.5*   GFR: CrCl cannot be calculated (Unknown ideal weight.). Liver Function Tests: Recent Labs  Lab 05/17/24 0059  AST 219*  ALT 64*  ALKPHOS 60  BILITOT 1.0  PROT 4.7*  ALBUMIN 2.4*   No results for input(s): LIPASE, AMYLASE in the last 168 hours. No results for input(s): AMMONIA in the last 168 hours. Coagulation Profile: No results for input(s): INR, PROTIME in the last 168 hours. Cardiac Enzymes: Recent Labs  Lab 05/17/24 0059  CKTOTAL 7,623*   BNP (last 3 results) No results for input(s): PROBNP in the last 8760 hours. HbA1C: No results for input(s): HGBA1C in the last 72 hours. CBG: No results for input(s): GLUCAP in the last 168 hours. Lipid Profile: No results for input(s): CHOL, HDL, LDLCALC, TRIG, CHOLHDL, LDLDIRECT in the last 72 hours. Thyroid  Function Tests: No results for input(s): TSH, T4TOTAL, FREET4, T3FREE, THYROIDAB in the last 72 hours. Anemia Panel: No results for input(s): VITAMINB12, FOLATE, FERRITIN, TIBC, IRON, RETICCTPCT in the last 72 hours. Urine analysis:    Component Value Date/Time   COLORURINE YELLOW (A) 04/07/2022 1202   APPEARANCEUR Cloudy (A) 04/16/2023 1424   LABSPEC 1.012 04/07/2022 1202   PHURINE 6.0 04/07/2022 1202   GLUCOSEU Negative 04/16/2023 1424   HGBUR LARGE (A) 04/07/2022 1202   BILIRUBINUR Negative 04/16/2023 1424   KETONESUR NEGATIVE 04/07/2022 1202   PROTEINUR 2+ (A) 04/16/2023 1424   PROTEINUR 100 (A) 04/07/2022 1202   NITRITE Positive (A) 04/16/2023 1424   NITRITE NEGATIVE 04/07/2022 1202   LEUKOCYTESUR 3+ (A) 04/16/2023 1424   LEUKOCYTESUR LARGE (A) 04/07/2022 1202    Radiological Exams on Admission: DG Humerus Left Result Date:  05/17/2024 CLINICAL DATA:  Left humeral fracture with original injury 04/20/2024, fell again yesterday, with increasing pain. EXAM: LEFT HUMERUS - 2+ VIEW COMPARISON:  AP and lateral left humerus 04/25/2024 FINDINGS: Two views. Fiberglass casting material again is noted. A nondisplaced left humeral neck fracture extending through the greater tuberosity demonstrates no interval displacement. There has been a change in the spiral proximal to mid shaft humeral fracture alignment. There is now 4.3 cm of over-riding towards the anteromedial aspect of the distal fragment relative to the  proximal fragment. The distal fragment is displaced anteriorly up to 2/3 of the bone width, medially up to 1/2 of a shaft width, with slight anterior and medial angulation of the distal fragment with improvement in the degree of angulation. No healing callus is yet seen. There is osteopenia without evidence of new fractures or primary pathologic process. Unchanged mild swelling in the upper arm soft tissues. IMPRESSION: 1. Change in the spiral proximal to mid shaft humeral fracture alignment, with 4.3 cm of over-riding towards the anteromedial aspect of the distal fragment relative to the proximal fragment. 2. The distal fragment is displaced anteriorly up to 2/3 of the bone width, medially up to 1/2 of a shaft width, with slight anterior and medial angulation of the distal fragment with improvement in the degree of angulation. 3. No interval displacement of the nondisplaced left humeral neck fracture extending through the greater tuberosity. 4. Osteopenia. Electronically Signed   By: Francis Quam M.D.   On: 05/17/2024 02:33   DG HIPS BILAT WITH PELVIS MIN 5 VIEWS Result Date: 05/17/2024 CLINICAL DATA:  Fall, bruising EXAM: DG HIP (WITH OR WITHOUT PELVIS) 5+V BILAT COMPARISON:  None Available. FINDINGS: There is no evidence of hip fracture or dislocation. There is no evidence of arthropathy or other focal bone abnormality. IMPRESSION:  Negative. Electronically Signed   By: Franky Crease M.D.   On: 05/17/2024 02:27   DG Tibia/Fibula Right Result Date: 05/17/2024 CLINICAL DATA:  Fall, bruising EXAM: RIGHT TIBIA AND FIBULA - 2 VIEW COMPARISON:  None Available. FINDINGS: There is no evidence of fracture or other focal bone lesions. Soft tissues are unremarkable. IMPRESSION: Negative. Electronically Signed   By: Franky Crease M.D.   On: 05/17/2024 02:26   DG Hand 2 View Left Result Date: 05/17/2024 CLINICAL DATA:  Fall, bruising EXAM: LEFT HAND - 2 VIEW COMPARISON:  None Available. FINDINGS: There is no evidence of fracture or dislocation. There is no evidence of arthropathy or other focal bone abnormality. Soft tissues are unremarkable. IMPRESSION: Negative. Electronically Signed   By: Franky Crease M.D.   On: 05/17/2024 02:26   DG Forearm Left Result Date: 05/17/2024 CLINICAL DATA:  Fall, bruising, pain EXAM: LEFT FOREARM - 2 VIEW COMPARISON:  None Available. FINDINGS: There is no evidence of fracture or other focal bone lesions. Soft tissues are unremarkable. IMPRESSION: No acute bony abnormality within the left forearm. Electronically Signed   By: Franky Crease M.D.   On: 05/17/2024 02:25   Data Reviewed for HPI: Relevant notes from primary care and specialist visits, past discharge summaries as available in EHR, including Care Everywhere. Prior diagnostic testing as pertinent to current admission diagnoses Updated medications and problem lists for reconciliation ED course, including vitals, labs, imaging, treatment and response to treatment Triage notes, nursing and pharmacy notes and ED provider's notes Notable results as noted above in HPI      Assessment and Plan: * Rhabdomyolysis, traumatic IV hydration Pain control Monitor CK  History of fracture of humerus 04/20/24 Pain and swelling left hand Patient was in a long-arm splint that was removed in the ED Has not yet followed up with Ortho X-ray of the humerus showing no  acute findings Consider Ortho consult  Fall at home, initial encounter History of fall on 04/20/2024 with humeral fracture PT eval  Hypokalemia Hypomagnesemia Replete and monitor  Transaminitis History of hepatitis C s/p treatment Suspect secondary to rhabdo Monitor for improvement with hydration  Elevated troponin No complaints of chest pain.  EKG  showing T wave inversion in inferior leads Continue to trend Further workup if showing ACS trend  Cellulitis of right lower extremity Rocephin  Keep leg elevated  Chronic obstructive pulmonary disease (HCC) Not acutely exacerbated Continue home inhalers.  DuoNebs as needed  Methadone  dependence (HCC) Continue methadone  pending verification  Diabetes mellitus type 2, diet-controlled (HCC) Sliding scale insulin  coverage        DVT prophylaxis: Lovenox   Consults: none  Advance Care Planning:   Code Status: Prior   Family Communication: none  Disposition Plan: Back to previous home environment  Severity of Illness: The appropriate patient status for this patient is OBSERVATION. Observation status is judged to be reasonable and necessary in order to provide the required intensity of service to ensure the patient's safety. The patient's presenting symptoms, physical exam findings, and initial radiographic and laboratory data in the context of their medical condition is felt to place them at decreased risk for further clinical deterioration. Furthermore, it is anticipated that the patient will be medically stable for discharge from the hospital within 2 midnights of admission.   Author: Delayne LULLA Solian, MD 05/17/2024 3:39 AM  For on call review [email or web address].

## 2024-05-17 NOTE — ED Notes (Signed)
 This tech called [facility] at this time and spoke with [facility] to place pt on cardiac monitor.

## 2024-05-17 NOTE — Consult Note (Signed)
 ORTHOPAEDIC CONSULTATION  REQUESTING PHYSICIAN: Dezii, Alexandra, DO  Chief Complaint:   Left humerus fracture  History of Present Illness: Kim Graham is a 73 y.o. female who presented after a fall on 04/20/2024 to the emergency room was found to have a left humeral shaft fracture.  Patient was placed in a splint and seen outpatient after that.  She had another fall approximately 12 hours ago and was down for about 5 hours in the floor.  She denies loss of consciousness or head strike just that her legs felt weak and she fell to the ground.  The fracture of her left humerus was in the splint at the time and she denies any significant changes to it.  She does report that she has been having some weakness in the left hand she is not sure how many days she has had that.  Possible it has been going on for longer.  She denies any neck pain chest pain or headache at this time.  No numbness at this time.  Reports he is right-hand dominant.  Past Medical History:  Diagnosis Date   Acute cholecystitis 12/09/2021   Bile leak    Chronic kidney disease, stage 3a (HCC)    Chronic venous insufficiency    COPD (chronic obstructive pulmonary disease) (HCC)    Depression    Hep C w/o coma, chronic (HCC)    treated   History of kidney stones    Hypertension    Methadone  dependence (HCC)    Nephrolithiasis    Pre-diabetes    Sepsis secondary to UTI (HCC)    Thrombocytopenia (HCC)    Past Surgical History:  Procedure Laterality Date   CYSTOSCOPY W/ URETERAL STENT PLACEMENT Right 12/09/2021   Procedure: CYSTOSCOPY WITH RETROGRADE PYELOGRAM/URETERAL STENT PLACEMENT;  Surgeon: Twylla Glendia BROCKS, MD;  Location: [facility];  Service: Urology;  Laterality: Right;   CYSTOSCOPY/URETEROSCOPY/HOLMIUM LASER/STENT PLACEMENT Bilateral 02/11/2022   Procedure: CYSTOSCOPY/URETEROSCOPY/HOLMIUM LASER/STENT PLACEMENT & RIGHT URETERAL STENT EXCHANGE;  Surgeon:  Twylla Glendia BROCKS, MD;  Location: [facility];  Service: Urology;  Laterality: Bilateral;   CYSTOSCOPY/URETEROSCOPY/HOLMIUM LASER/STENT PLACEMENT Bilateral 05/19/2022   Procedure: CYSTOSCOPY/URETEROSCOPY/HOLMIUM LASER/STENT PLACEMENT;  Surgeon: Twylla Glendia BROCKS, MD;  Location: [facility];  Service: Urology;  Laterality: Bilateral;   ENDOSCOPIC RETROGRADE CHOLANGIOPANCREATOGRAPHY (ERCP) WITH PROPOFOL  N/A 12/11/2021   Procedure: ENDOSCOPIC RETROGRADE CHOLANGIOPANCREATOGRAPHY (ERCP) WITH PROPOFOL ;  Surgeon: Jinny Carmine, MD;  Location: [facility] ENDOSCOPY;  Service: Endoscopy;  Laterality: N/A;   ERCP N/A 03/10/2022   Procedure: ENDOSCOPIC RETROGRADE CHOLANGIOPANCREATOGRAPHY (ERCP);  Surgeon: Jinny Carmine, MD;  Location: [facility] ENDOSCOPY;  Service: Endoscopy;  Laterality: N/A;  Stent removal   ERCP N/A 06/23/2022   Procedure: ENDOSCOPIC RETROGRADE CHOLANGIOPANCREATOGRAPHY (ERCP);  Surgeon: Jinny Carmine, MD;  Location: [facility] ENDOSCOPY;  Service: Endoscopy;  Laterality: N/A;  stent removal   PILONIDAL CYST EXCISION N/A 12/29/2023   Procedure: CYST EXCISION PILONIDAL EXTENSIVE;  Surgeon: Marinda Jayson KIDD, MD;  Location: [facility];  Service: General;  Laterality: N/A;   PLACEMENT OF BREAST IMPLANTS Bilateral    TONSILLECTOMY     Social History   Socioeconomic History   Marital status: Single    Spouse name: Not on file   Number of children: Not on file   Years of education: Not on file   Highest education level: Not on file  Occupational History   Not on file  Tobacco Use   Smoking status: Former    Current packs/day: 0.00    Average packs/day: 0.5 packs/day for 49.0 years (24.5  ttl pk-yrs)    Types: Cigarettes    Start date: 12/13/1969    Quit date: 12/13/2018    Years since quitting: 5.4    Passive exposure: Past   Smokeless tobacco: Never  Vaping Use   Vaping status: Never Used  Substance and Sexual Activity   Alcohol use: Not Currently    Comment: On occasion   Drug use: Not Currently    Types:  Heroin    Comment: revovering addict   Sexual activity: Not Currently  Other Topics Concern   Not on file  Social History Narrative   Not on file   Social Drivers of Health   Financial Resource Strain: Low Risk  (05/02/2024)   Received from [facility]   Overall Financial Resource Strain (CARDIA)    Difficulty of Paying Living Expenses: Not hard at all  Food Insecurity: No Food Insecurity (05/02/2024)   Received from [facility]   Hunger Vital Sign    Within the past 12 months, you worried that your food would run out before you got the money to buy more.: Never true    Within the past 12 months, the food you bought just didn't last and you didn't have money to get more.: Never true  Transportation Needs: Unmet Transportation Needs (05/02/2024)   Received from [facility] - Transportation    In the past 12 months, has lack of transportation kept you from medical appointments or from getting medications?: Yes    Lack of Transportation (Non-Medical): Yes  Physical Activity: Insufficiently Active (08/05/2021)   Exercise Vital Sign    Days of Exercise per Week: 4 days    Minutes of Exercise per Session: 20 min  Stress: No Stress Concern Present (08/05/2021)   [employer] of Occupational Health - Occupational Stress Questionnaire    Feeling of Stress : Not at all  Social Connections: Moderately Isolated (08/05/2021)   Social Connection and Isolation Panel    Frequency of Communication with Friends and Family: More than three times a week    Frequency of Social Gatherings with Friends and Family: Three times a week    Attends Religious Services: Never    Active Member of Clubs or Organizations: No    Attends Engineer, structural: More than 4 times per year    Marital Status: Divorced   Family History  Problem Relation Age of Onset   Diabetes Mother    Cancer Father        Pancreatic   Diabetes Maternal  Aunt    Diabetes Maternal Grandmother    Dementia Maternal Grandfather    Heart disease Maternal Grandfather    Allergies  Allergen Reactions   Doxycycline  Diarrhea    Very bad diarrhea   Prior to Admission medications   Medication Sig Start Date End Date Taking? Authorizing Provider  albuterol  (PROVENTIL ) (2.5 MG/3ML) 0.083% nebulizer solution Take 3 mLs (2.5 mg total) by nebulization every 6 (six) hours as needed for wheezing or shortness of breath. 04/28/24  Yes Johnson, Megan P, DO  albuterol  (VENTOLIN  HFA) 108 (90 Base) MCG/ACT inhaler Inhale 2 puffs into the lungs every 6 (six) hours as needed for wheezing or shortness of breath. 04/28/24  Yes Johnson, Megan P, DO  ARIPiprazole  (ABILIFY ) 30 MG tablet Take 1 tablet (30 mg total) by mouth daily. 04/28/24  Yes Johnson, Megan P, DO  budesonide -glycopyrrolate -formoterol  (BREZTRI  AEROSPHERE) 160-9-4.8 MCG/ACT AERO inhaler Inhale 2 puffs into the lungs 2 (  two) times daily. 04/28/24  Yes Johnson, Megan P, DO  fluticasone  (FLONASE ) 50 MCG/ACT nasal spray Place 2 sprays into both nostrils daily. 09/21/23  Yes Pearley, Hyla Givens, NP  methadone  (DOLOPHINE ) 10 MG/ML solution Take 100 mg by mouth daily.   Yes [provider]  Multiple Vitamin (MULTI-VITAMIN) tablet Take 1 tablet by mouth daily.   Yes [provider]  ascorbic acid  (VITAMIN C) 500 MG tablet Take 500 mg by mouth daily. Patient not taking: Reported on 05/10/2024    [provider]  furosemide  (LASIX ) 20 MG tablet Take 1 tablet (20 mg total) by mouth daily. Patient not taking: No sig reported 02/25/24   Vicci Bouchard P, DO  mupirocin  ointment (BACTROBAN ) 2 % Apply 1 Application topically 2 (two) times daily. Apply to areas of abrasions on lower legs. Patient not taking: No sig reported 01/24/24   Cannady, Jolene T, NP  ondansetron  (ZOFRAN ) 4 MG tablet Take 1 tablet (4 mg total) by mouth every 8 (eight) hours as needed for nausea or vomiting. Patient not  taking: Reported on 05/17/2024 09/14/23   Thaddeus Hyla Givens, NP  Spacer/Aero-Holding [facility] Use as directed Dx: COPD 11/24/23   Vicci Bouchard SQUIBB, DO   DG Humerus Left Result Date: 05/17/2024 CLINICAL DATA:  Left humeral fracture with original injury 04/20/2024, fell again yesterday, with increasing pain. EXAM: LEFT HUMERUS - 2+ VIEW COMPARISON:  AP and lateral left humerus 04/25/2024 FINDINGS: Two views. Fiberglass casting material again is noted. A nondisplaced left humeral neck fracture extending through the greater tuberosity demonstrates no interval displacement. There has been a change in the spiral proximal to mid shaft humeral fracture alignment. There is now 4.3 cm of over-riding towards the anteromedial aspect of the distal fragment relative to the proximal fragment. The distal fragment is displaced anteriorly up to 2/3 of the bone width, medially up to 1/2 of a shaft width, with slight anterior and medial angulation of the distal fragment with improvement in the degree of angulation. No healing callus is yet seen. There is osteopenia without evidence of new fractures or primary pathologic process. Unchanged mild swelling in the upper arm soft tissues. IMPRESSION: 1. Change in the spiral proximal to mid shaft humeral fracture alignment, with 4.3 cm of over-riding towards the anteromedial aspect of the distal fragment relative to the proximal fragment. 2. The distal fragment is displaced anteriorly up to 2/3 of the bone width, medially up to 1/2 of a shaft width, with slight anterior and medial angulation of the distal fragment with improvement in the degree of angulation. 3. No interval displacement of the nondisplaced left humeral neck fracture extending through the greater tuberosity. 4. Osteopenia. Electronically Signed   By: Francis Quam M.D.   On: 05/17/2024 02:33   DG HIPS BILAT WITH PELVIS MIN 5 VIEWS Result Date: 05/17/2024 CLINICAL DATA:  Fall, bruising EXAM: DG HIP (WITH OR  WITHOUT PELVIS) 5+V BILAT COMPARISON:  None Available. FINDINGS: There is no evidence of hip fracture or dislocation. There is no evidence of arthropathy or other focal bone abnormality. IMPRESSION: Negative. Electronically Signed   By: Franky Crease M.D.   On: 05/17/2024 02:27   DG Tibia/Fibula Right Result Date: 05/17/2024 CLINICAL DATA:  Fall, bruising EXAM: RIGHT TIBIA AND FIBULA - 2 VIEW COMPARISON:  None Available. FINDINGS: There is no evidence of fracture or other focal bone lesions. Soft tissues are unremarkable. IMPRESSION: Negative. Electronically Signed   By: Franky Crease M.D.   On: 05/17/2024 02:26  DG Hand 2 View Left Result Date: 05/17/2024 CLINICAL DATA:  Fall, bruising EXAM: LEFT HAND - 2 VIEW COMPARISON:  None Available. FINDINGS: There is no evidence of fracture or dislocation. There is no evidence of arthropathy or other focal bone abnormality. Soft tissues are unremarkable. IMPRESSION: Negative. Electronically Signed   By: Franky Crease M.D.   On: 05/17/2024 02:26   DG Forearm Left Result Date: 05/17/2024 CLINICAL DATA:  Fall, bruising, pain EXAM: LEFT FOREARM - 2 VIEW COMPARISON:  None Available. FINDINGS: There is no evidence of fracture or other focal bone lesions. Soft tissues are unremarkable. IMPRESSION: No acute bony abnormality within the left forearm. Electronically Signed   By: Franky Crease M.D.   On: 05/17/2024 02:25    Positive ROS: All other systems have been reviewed and were otherwise negative with the exception of those mentioned in the HPI and as above.  Physical Exam: General:  Alert, no acute distress Psychiatric:  Patient is competent for consent with normal mood and affect   Cardiovascular:  No pedal edema Respiratory:  No wheezing, non-labored breathing GI:  Abdomen is soft and non-tender Skin:  No lesions in the area of chief complaint Neurologic:  Sensation intact distally Lymphatic:  No axillary or cervical lymphadenopathy  Orthopedic Exam:  Left  upper extremity Upper arm in a coaptation splint well-padded No tenderness over the forearm wrist or hand Neurovascularly intact over the hand except with weakness to wrist extension and thumb abduction. Intact radial pulse mild swelling no pitting  Secondary survey No tenderness to palpation over other bony prominences in the lower extremities or right upper extremity No pain with logroll or simulated axial loading of the bilateral lower extremity All compartments soft No tenderness to palpation over the cervical or thoracic spine, no bony step-off Motor grossly intact throughout, no focal deficits Sensation grossly intact throughout, no focal deficits Good distal pulses and capillary refill on all extremities   X-rays:  X-rays reviewed which show a subacute spiral fracture of the left humeral shaft and a nondisplaced fracture of the left proximal humerus.  Rotation is different from prior films but no significant change in displacement.  No new fractures noted agree with radiologist interpretation.  Assessment: Left spiral humeral shaft fracture, left wrist radial nerve palsy  Plan: I reviewed the clinical and radiographic findings with the patient.  She has sustained a spiral humeral shaft fracture and we will plan to convert her from coaptation splint to a Sarmiento brace at this time.  We will order this brace from [facility] and try to get fitted while she is here.  We did discuss getting her follow-up with orthopedic trauma outpatient for second pain evaluation for consideration for possible fixation and now given the nerve palsy.  We discussed the ongoing risk of nonunion, malunion, chronic nerve injury, and the possibility of needing surgery for this fracture.  All questions answered patient understands and will plan for outpatient follow-up after discharge.    Arthea Sheer MD  Beeper #:  [phone]  05/17/2024 1:26 PM

## 2024-05-17 NOTE — Assessment & Plan Note (Signed)
 Continue methadone  pending verification

## 2024-05-17 NOTE — Assessment & Plan Note (Signed)
 No complaints of chest pain.  EKG showing T wave inversion in inferior leads Continue to trend Further workup if showing ACS trend

## 2024-05-17 NOTE — ED Provider Notes (Signed)
 [facility] Provider Note    Event Date/Time   First MD Initiated Contact with Patient 05/17/24 0022     (approximate)   History   Fall   HPI  Kim Graham is a 73 y.o. female with history of COPD, hypertension who comes in with concern for a fall 7 hours ago where she was on the floor for 5 hours.  Patient reports having a fall yesterday, Tuesday 7/8.  She states that her legs have just felt weak and that she fell onto the ground.  She states that she did not hit her head denies any confusion.  She states that she just was not able to get up secondary to pain.  She is got a chronic fracture of her left humerus that she has not followed up with orthopedics yet for and she reports some redness and warmth to the hand that is been ongoing for some weeks now.  She also reports some chronic redness and warmth of the right shin.  She denies any new neck pain, chest wall pain, abdominal pain or any other injuries.  Reviewed the admission summary from 12/29/2023 where patient had a pilonidal cyst.  Physical Exam   Triage Vital Signs: ED Triage Vitals  Encounter Vitals Group     BP 05/17/24 0025 134/68     Girls Systolic BP Percentile --      Girls Diastolic BP Percentile --      Boys Systolic BP Percentile --      Boys Diastolic BP Percentile --      Pulse Rate 05/17/24 0025 96     Resp 05/17/24 0025 (!) 22     Temp 05/17/24 0025 98.7 F (37.1 C)     Temp Source 05/17/24 0025 Oral     SpO2 05/17/24 0025 95 %     Weight --      Height --      Head Circumference --      Peak Flow --      Pain Score 05/17/24 0026 5     Pain Loc --      Pain Education --      Exclude from Growth Chart --     Most recent vital signs: Vitals:   05/17/24 0025  BP: 134/68  Pulse: 96  Resp: (!) 22  Temp: 98.7 F (37.1 C)  SpO2: 95%     General: Awake, no distress.  CV:  Good peripheral perfusion.  Resp:  Normal effort.  Abd:  No distention.  Other:  Patient is  able to slightly lift up both legs from the bed she does report a little bit of pain in bilateral hips.  Her right tib-fib area has some redness and warmth noted to it.  Her left arm was in a coaptation splint which was taken down skin was examined.  Underneath the splint was well-appearing but her left hand looks swollen felt red and was warm.  Good distal pulse.  She had no chest wall tenderness but some old bruising that she reported was from a fall a few weeks ago.  Her abdomen is soft nontender.  She is got no hematoma of the head no C-spine tenderness full range of motion of neck.  Sensation intact throughout.   ED Results / Procedures / Treatments   Labs (all labs ordered are listed, but only abnormal results are displayed) Labs Reviewed  CBC WITH DIFFERENTIAL/PLATELET  COMPREHENSIVE METABOLIC PANEL WITH GFR  CK  URINALYSIS,  ROUTINE W REFLEX MICROSCOPIC  TROPONIN I (HIGH SENSITIVITY)     EKG  My interpretation of EKG:  Normal sinus rhythm 92 without any ST elevation, T wave inversions maybe in V5 and V6, normal intervals  RADIOLOGY I have reviewed the xray personally and interpreted no fracute of hips    PROCEDURES:  Critical Care performed: No  .1-3 Lead EKG Interpretation  Performed by: Ernest Ronal BRAVO, MD Authorized by: Ernest Ronal BRAVO, MD     Interpretation: normal     ECG rate:  90   ECG rate assessment: normal     Rhythm: sinus rhythm     Ectopy: none     Conduction: normal      MEDICATIONS ORDERED IN ED: Medications  potassium chloride  10 mEq in 100 mL IVPB (has no administration in time range)     IMPRESSION / MDM / ASSESSMENT AND PLAN / ED COURSE  I reviewed the triage vital signs and the nursing notes.   Patient's presentation is most consistent with acute presentation with potential threat to life or bodily function.   Patient comes in with concerns for fall secondary to weakness.  On examination it is concerning for some cellulitis of the left  hand and the right tib-fib area.  X-rays are ordered to ensure no additional fractures.  Recommend CT imaging to rule out intercranial hemorrhage but patient is adamant that she did not hit her head and does not have any neck pain and is declined CT imaging.  Given there is no evidence of trauma on examination and patient is answering questions appropriately and patient is not on any blood thinners will hold off given pt is adament she didn't hit her head.   Troponin is elevated but similar to priors.  CBC reassuring CMP does show low potassium of 2.5 slight elevation of LFTs.  X-rays were overall reassuring I have low suspicion for occult fracture given she is able to lift the legs up slightly off the bed.  I rediscussed with patient CT imaging of her head and neck but again she declines she is got capacity make this decision and understands that there is a chance for missing something life-threatening but she is adamant she did not hit her head.  Given her cellulitis, fall, hypokalemia will discuss with the hospitalist for admission.  Patient does not meet sepsis criteria  The patient is on the cardiac monitor to evaluate for evidence of arrhythmia and/or significant heart rate changes.      FINAL CLINICAL IMPRESSION(S) / ED DIAGNOSES   Final diagnoses:  Fall, initial encounter  Cellulitis, unspecified cellulitis site  Hypokalemia     Rx / DC Orders   ED Discharge Orders     None        Note:  This document was prepared using Dragon voice recognition software and may include unintentional dictation errors.   Ernest Ronal BRAVO, MD 05/17/24 [phone]

## 2024-05-17 NOTE — Assessment & Plan Note (Signed)
 Hypomagnesemia Replete and monitor

## 2024-05-17 NOTE — Patient Outreach (Signed)
 Per EPIC , patient was admitted to hospital today due to a fall.

## 2024-05-17 NOTE — Assessment & Plan Note (Signed)
 Rocephin  Keep leg elevated

## 2024-05-17 NOTE — Assessment & Plan Note (Signed)
 Not acutely exacerbated Continue home inhalers.  DuoNebs as needed

## 2024-05-17 NOTE — Progress Notes (Signed)
   Interim no charge progress note   This patient, Kim Graham is a 73 y.o. female who was admitted by colleague earlier this morning for rhabdomyolysis, and subsequent encounter for left humeral shaft fracture. I have consulted and discussed with orthopedic surgery this morning.  Orthopedic surgery is obtaining an alternative brace to fit the patient while she is here.  She will still need to follow-up with orthopedic trauma outpatient to consider possible fixation. In the interim while monitor labs closely and ensure resolution of rhabdomyolysis. On my evaluation this morning patient is reporting some right sided leg pain around area of cellulitis.  Dopplers were obtained which are negative for DVT.  Will continue with ceftriaxone  and monitor for resolution. We have resumed home dose methadone .       05/17/2024    3:37 PM 05/17/2024    1:00 PM 05/17/2024   12:30 PM  Vitals with BMI  Systolic 126 123 870  Diastolic 57 54 68  Pulse 89 87 90       Latest Ref Rng & Units 05/17/2024   12:59 AM 04/28/2024    2:54 PM 12/28/2023    2:01 PM  CBC  WBC 4.0 - 10.5 K/uL 6.6  11.6  8.1   Hemoglobin 12.0 - 15.0 g/dL 87.1  87.3  86.0   Hematocrit 36.0 - 46.0 % 40.7  36.9  43.6   Platelets 150 - 400 K/uL 158  325  177        Latest Ref Rng & Units 05/17/2024    3:53 AM 05/17/2024   12:59 AM 04/28/2024    2:39 PM  BMP  Glucose 70 - 99 mg/dL 89  89  881   BUN 8 - 23 mg/dL 15  14  16    Creatinine 0.44 - 1.00 mg/dL 9.20  9.28  9.08   BUN/Creat Ratio 12 - 28   18   Sodium 135 - 145 mmol/L 142  144  138   Potassium 3.5 - 5.1 mmol/L 3.0  2.5  3.0   Chloride 98 - 111 mmol/L 106  112  99   CO2 22 - 32 mmol/L 27  24  20    Calcium 8.9 - 10.3 mg/dL 8.2  7.1  9.5

## 2024-05-17 NOTE — Progress Notes (Signed)
 Orthopedic Tech Progress Note Patient Details:  Kim Graham December 07, 1950 [id number]  [facility] [employer] needed more information for patient.  Patient ID: Kim Graham, female   DOB: May 13, 1951, 73 y.o.   MRN: [id number]  Kim Graham Pac 05/17/2024, 5:20 PM

## 2024-05-17 NOTE — ED Triage Notes (Signed)
 BIBA due to a fall 7 hours ago.  On the floor for 5 hours.  Boyfriend picked her up and put her in a chair and left her.  No leg pain or arm pain other than the one that is broken.  EMS states arm looks like it has a skin infection in the hand area and upper arm.  Leg appears red and swollen.  Complained of no pain in hip area when EMS moved it around  BP129 59 99 HR 91 RA  IV 18 right forearm - does not pull back - does flow in.

## 2024-05-17 NOTE — Assessment & Plan Note (Signed)
 Sliding scale insulin coverage

## 2024-05-17 NOTE — Assessment & Plan Note (Signed)
 IV hydration Pain control Monitor CK

## 2024-05-18 DIAGNOSIS — L03115 Cellulitis of right lower limb: Secondary | ICD-10-CM | POA: Diagnosis not present

## 2024-05-18 DIAGNOSIS — R7989 Other specified abnormal findings of blood chemistry: Secondary | ICD-10-CM | POA: Diagnosis not present

## 2024-05-18 DIAGNOSIS — F1120 Opioid dependence, uncomplicated: Secondary | ICD-10-CM | POA: Diagnosis not present

## 2024-05-18 DIAGNOSIS — E876 Hypokalemia: Secondary | ICD-10-CM | POA: Diagnosis not present

## 2024-05-18 LAB — TSH: TSH: 2.208 u[IU]/mL (ref 0.350–4.500)

## 2024-05-18 LAB — MAGNESIUM: Magnesium: 2.1 mg/dL (ref 1.7–2.4)

## 2024-05-18 LAB — COMPREHENSIVE METABOLIC PANEL WITH GFR
ALT: 61 U/L — ABNORMAL HIGH (ref 0–44)
AST: 135 U/L — ABNORMAL HIGH (ref 15–41)
Albumin: 2.5 g/dL — ABNORMAL LOW (ref 3.5–5.0)
Alkaline Phosphatase: 61 U/L (ref 38–126)
Anion gap: 11 (ref 5–15)
BUN: 13 mg/dL (ref 8–23)
CO2: 24 mmol/L (ref 22–32)
Calcium: 8.4 mg/dL — ABNORMAL LOW (ref 8.9–10.3)
Chloride: 109 mmol/L (ref 98–111)
Creatinine, Ser: 0.8 mg/dL (ref 0.44–1.00)
GFR, Estimated: >60 mL/min (ref >60)
Glucose, Bld: 94 mg/dL (ref 70–99)
Potassium: 3.7 mmol/L (ref 3.5–5.1)
Sodium: 144 mmol/L (ref 135–145)
Total Bilirubin: 0.8 mg/dL (ref 0.0–1.2)
Total Protein: 5.3 g/dL — ABNORMAL LOW (ref 6.5–8.1)

## 2024-05-18 LAB — CBC WITH DIFFERENTIAL/PLATELET
Abs Immature Granulocytes: 0.03 K/uL (ref 0.00–0.07)
Basophils Absolute: 0 K/uL (ref 0.0–0.1)
Basophils Relative: 0 %
Eosinophils Absolute: 0.1 K/uL (ref 0.0–0.5)
Eosinophils Relative: 2 %
HCT: 37.9 % (ref 36.0–46.0)
Hemoglobin: 11.6 g/dL — ABNORMAL LOW (ref 12.0–15.0)
Immature Granulocytes: 0 %
Lymphocytes Relative: 21 %
Lymphs Abs: 1.5 K/uL (ref 0.7–4.0)
MCH: 28.9 pg (ref 26.0–34.0)
MCHC: 30.6 g/dL (ref 30.0–36.0)
MCV: 94.3 fL (ref 80.0–100.0)
Monocytes Absolute: 0.8 K/uL (ref 0.1–1.0)
Monocytes Relative: 11 %
Neutro Abs: 4.7 K/uL (ref 1.7–7.7)
Neutrophils Relative %: 66 %
Platelets: 235 K/uL (ref 150–400)
RBC: 4.02 MIL/uL (ref 3.87–5.11)
RDW: 16.3 % — ABNORMAL HIGH (ref 11.5–15.5)
WBC: 7.3 K/uL (ref 4.0–10.5)
nRBC: 0 % (ref 0.0–0.2)

## 2024-05-18 LAB — AMMONIA: Ammonia: 34 umol/L (ref 9–35)

## 2024-05-18 LAB — CK: Total CK: 1276 U/L — ABNORMAL HIGH (ref 38–234)

## 2024-05-18 LAB — PHOSPHORUS: Phosphorus: 2.7 mg/dL (ref 2.5–4.6)

## 2024-05-18 NOTE — Plan of Care (Signed)
  Problem: Education: Goal: Knowledge of General Education information will improve Description: Including pain rating scale, medication(s)/side effects and non-pharmacologic comfort measures Outcome: Progressing   Problem: Clinical Measurements: Goal: Ability to maintain clinical measurements within normal limits will improve Outcome: Progressing Goal: Will remain free from infection Outcome: Progressing   Problem: Activity: Goal: Risk for activity intolerance will decrease Outcome: Progressing   Problem: Coping: Goal: Level of anxiety will decrease Outcome: Progressing   Problem: Elimination: Goal: Will not experience complications related to urinary retention Outcome: Progressing   Problem: Pain Managment: Goal: General experience of comfort will improve and/or be controlled Outcome: Progressing   Problem: Skin Integrity: Goal: Risk for impaired skin integrity will decrease Outcome: Progressing

## 2024-05-18 NOTE — Progress Notes (Signed)
 Occupational Therapy Treatment Patient Details Name: Kim Graham MRN: [id number] DOB: 1951/10/08 Today's Date: 05/18/2024   History of present illness Pt 73 y.o. female who presented after a fall on 04/20/2024 to the emergency room was found to have a left humeral shaft fracture.  Patient was placed in a splint and seen outpatient after that.  She had another fall approximately 12 hours ago and was down for about 5 hours in the floor. Reports having L hand weakness. PMH of COPD, depression, DM.   OT comments  Pt seen for additional tx this afternoon to provide brace mgt education/training and optimize fit/comfort. Pt in recliner with PT. Pt's sitting balance very poor, leaning heavily to the L side despite LUE being very painful. Pt also more lethargic this afternoon. MD, RN notified promptly. Pt demonstrating difficulty with maintaining alertness, poor safety awareness, and problem solving. Pt required TOTAL A for brace mgt for Sarmiento brace with use of oversized soft sling for improved forearm/wrist support given radial nerve palsy in addition to a wrist carpal tunnel brace to improve more neutral functional wrist positioning with promoting more composite finger extension. Pillows placed to support midline positioning and propping for BUE. Pt continues to benefit. RN requested to assess skin later today for s/s skin breakdown to ensure optimal fit/positioning of braces.       If plan is discharge home, recommend the following:  A little help with walking and/or transfers;A lot of help with bathing/dressing/bathroom;Direct supervision/assist for medications management;Assist for transportation;Assistance with cooking/housework;Help with stairs or ramp for entrance   Equipment Recommendations  Other (comment) (defer)    Recommendations for Other Services      Precautions / Restrictions Precautions Precautions: Fall;Other (comment) Recall of Precautions/Restrictions:  Impaired Precaution/Restrictions Comments: LUE brace, wrist carpal tunnel brace Other Brace: Sarmiento brace per Ortho, wrist carpel tunnel brace Restrictions Weight Bearing Restrictions Per Provider Order: Yes LUE Weight Bearing Per Provider Order: Non weight bearing Other Position/Activity Restrictions: no official order in chart, but given fracture assuming NWBing at this time.       Mobility Bed Mobility Overal bed mobility: Needs Assistance Bed Mobility: Supine to Sit     Supine to sit: Min assist, HOB elevated, Used rails       Patient Response: Anxious  Transfers Overall transfer level: Needs assistance Equipment used: 1 person hand held assist Transfers: Sit to/from Stand Sit to Stand: Contact guard assist                 Balance Overall balance assessment: Needs assistance Sitting-balance support: Feet supported Sitting balance-Leahy Scale: Poor Sitting balance - Comments: L lateral lean noted                                   ADL either performed or assessed with clinical judgement   ADL                                              Extremity/Trunk Assessment Upper Extremity Assessment Upper Extremity Assessment: Defer to OT evaluation   Lower Extremity Assessment Lower Extremity Assessment: Generalized weakness (able to move BLE against gravity)        Vision       Perception     Praxis     Communication  Cognition Arousal: Alert, Lethargic Behavior During Therapy: Flat affect Cognition: No family/caregiver present to determine baseline             OT - Cognition Comments: Pt more lethargic and confused this afternoon                 Following commands: Impaired Following commands impaired: Follows one step commands with increased time      Cueing   Cueing Techniques: Verbal cues, Tactile cues  Exercises Other Exercises Other Exercises: Pt educated in positioning, readjusted  current sling to improve comfort Other Exercises: OT assisted in brace mgt, fitting, positioning for optimal joint protection and skin integrity, optimal wrist and hand/finger positioning to prevent weakness/contracture. Educated in braces and applied stockinette underneath braces for skin protection. Pt endorsed improved comfort once completed.    Shoulder Instructions       General Comments      Pertinent Vitals/ Pain       Pain Assessment Pain Assessment: 0-10 Pain Score: 7  Pain Location: LUE Pain Descriptors / Indicators: Aching Pain Intervention(s): Limited activity within patient's tolerance, Monitored during session, Repositioned, Patient requesting pain meds-RN notified  Home Living Family/patient expects to be discharged to:: Private residence Living Arrangements: Alone   Type of Home: Mobile home Home Access: Stairs to enter Secretary/administrator of Steps: 7 Entrance Stairs-Rails: Right;Left Home Layout: One level     Bathroom Shower/Tub: Chief Strategy Officer: Standard     Home Equipment: [employer] - single point          Prior Functioning/Environment              Frequency  Min 2X/week        Progress Toward Goals  OT Goals(current goals can now be found in the care plan section)  Progress towards OT goals: OT to reassess next treatment  Acute Rehab OT Goals Patient Stated Goal: have less pain OT Goal Formulation: With patient Time For Goal Achievement: 06/01/24 Potential to Achieve Goals: Good  Plan      Co-evaluation                 AM-PAC OT 6 Clicks Daily Activity     Outcome Measure   Help from another person eating meals?: A Little Help from another person taking care of personal grooming?: A Little Help from another person toileting, which includes using toliet, bedpan, or urinal?: A Little Help from another person bathing (including washing, rinsing, drying)?: A Lot Help from another person to put on and  taking off regular upper body clothing?: A Lot Help from another person to put on and taking off regular lower body clothing?: A Little 6 Click Score: 16    End of Session    OT Visit Diagnosis: Unsteadiness on feet (R26.81);Muscle weakness (generalized) (M62.81);Repeated falls (R29.6);Pain Pain - Right/Left: Left Pain - part of body: Arm   Activity Tolerance Patient limited by pain;Patient limited by lethargy;Patient limited by fatigue   Patient Left in chair;with call bell/phone within reach;with chair alarm set   Nurse Communication Mobility status;Patient requests pain meds;Other (comment) (braces in place, lethargic/cognitive changes/concerns)        Time: 1432 [phone] ([phone]) OT Time Calculation (min): 22 min  Charges: OT General Charges $OT Visit: 1 Visit OT Treatments $Therapeutic Activity: 8-22 mins  Warren SAUNDERS., MPH, MS, OTR/L ascom [phone] 05/18/24, 4:53 PM

## 2024-05-18 NOTE — Evaluation (Signed)
 Occupational Therapy Evaluation Patient Details Name: Kim Graham MRN: [id number] DOB: Aug 29, 1951 Today's Date: 05/18/2024   History of Present Illness   73 y.o. female who presented after a fall on 04/20/2024 to the emergency room was found to have a left humeral shaft fracture.  Patient was placed in a splint and seen outpatient after that.  She had another fall approximately 12 hours ago and was down for about 5 hours in the floor. Reports having L hand weakness. PMH of COPD, depression, DM.     Clinical Impressions Pt was seen for OT evaluation this date. Prior to initial humeral fracture and recent admission in June, pt was independent and living alone with several pets. Since the fracture and return home, pt reports boyfriend helping her out a little and driving. Denies AD use. 7 STE with R/L rails. Pt endorses fear of falling now and anxious to attempt during session but agreeable. MIN A for bed mobility, CGA with handheld assist to stand EOB. CGA to take a couple small steps to the recliner. Pt in significant pain, formal assessment of LUE limited. Sling repositioned to maximize comfort and pillow placed under arm. Pt presents to acute OT demonstrating impaired ADL performance and functional mobility 2/2 significant LUE pain, decreased ROM, strength, and notable edema in L hand, impaired strength overall, and impaired balance, questionable cognition with notable STM deficits during session (See OT problem list for additional functional deficits). Pt currently requires MIN A for LB ADL and MAX A for UB ADL. Pt would benefit from skilled OT services to address noted impairments and functional limitations (see below for any additional details) in order to maximize safety and independence while minimizing falls risk and caregiver burden. Anticipate the need for follow up OT services upon acute hospital DC.    If plan is discharge home, recommend the following:   A little help with walking  and/or transfers;A lot of help with bathing/dressing/bathroom;Direct supervision/assist for medications management;Assist for transportation;Assistance with cooking/housework;Help with stairs or ramp for entrance     Functional Status Assessment   Patient has had a recent decline in their functional status and demonstrates the ability to make significant improvements in function in a reasonable and predictable amount of time.     Equipment Recommendations   Other (comment) (defer)     Recommendations for Other Services         Precautions/Restrictions   Precautions Precautions: Fall;Other (comment) Recall of Precautions/Restrictions: Impaired Precaution/Restrictions Comments: LUE in brace - pending fitting by [facility] Required Braces or Orthoses: Other Brace Other Brace: Sarmiento brace per Ortho Restrictions Weight Bearing Restrictions Per Provider Order: Yes LUE Weight Bearing Per Provider Order: Non weight bearing Other Position/Activity Restrictions: no official order in chart, but given fracture assuming NWBing at this time.     Mobility Bed Mobility Overal bed mobility: Needs Assistance Bed Mobility: Supine to Sit     Supine to sit: Min assist, HOB elevated, Used rails       Patient Response: Anxious  Transfers Overall transfer level: Needs assistance Equipment used: 1 person hand held assist Transfers: Sit to/from Stand Sit to Stand: Contact guard assist           Balance Overall balance assessment: Mild deficits observed, not formally tested          ADL either performed or assessed with clinical judgement   ADL Overall ADL's : Needs assistance/impaired      Upper Body Dressing : Maximal assistance   Lower Body  Dressing: Minimal assistance   Toilet Transfer: Contact guard assist   Toileting- Clothing Manipulation and Hygiene: Modified independent       Functional mobility during ADLs: Contact guard assist;Cueing for safety         Pertinent Vitals/Pain Pain Assessment Pain Assessment: 0-10 Pain Score: 7  Pain Location: LUE Pain Descriptors / Indicators: Aching Pain Intervention(s): Limited activity within patient's tolerance, Monitored during session, Premedicated before session, Repositioned     Extremity/Trunk Assessment Upper Extremity Assessment Upper Extremity Assessment: Generalized weakness;Right hand dominant;LUE deficits/detail LUE Deficits / Details: L hand with notable edema, LUE very painful, able to wiggle fingers but additional assessment too pain limited; possible radial nerve palsy per Ortho LUE: Unable to fully assess due to immobilization;Unable to fully assess due to pain;Shoulder pain at rest LUE Sensation: WNL LUE Coordination: decreased fine motor;decreased gross motor   Lower Extremity Assessment Lower Extremity Assessment: Generalized weakness;Defer to PT evaluation       Communication Communication Communication: No apparent difficulties   Cognition Arousal: Alert Behavior During Therapy: WFL for tasks assessed/performed Cognition: No family/caregiver present to determine baseline             OT - Cognition Comments: difficulty with STM/details of PLOF during session; alert and oriented x4                 Following commands: Intact       Cueing  General Comments   Cueing Techniques: Verbal cues      Exercises Other Exercises Other Exercises: Pt educated in positioning, readjusted current sling to improve comfort   Shoulder Instructions      Home Living Family/patient expects to be discharged to:: Private residence Living Arrangements: Alone   Type of Home: Mobile home Home Access: Stairs to enter Entrance Stairs-Number of Steps: 7 Entrance Stairs-Rails: Right;Left Home Layout: One level     Bathroom Shower/Tub: Chief Strategy Officer: Standard     Home Equipment: Cane - single point          Prior  Functioning/Environment Prior Level of Function : History of Falls (last six months);Driving;Independent/Modified Independent             Mobility Comments: indep ADLs Comments: before initial fall/fracture, indep + driving; since fracture mod indep with PRN assist for ADL and IADL from boyfriend including driving her    OT Problem List: Decreased strength;Decreased coordination;Pain;Decreased range of motion;Decreased cognition;Decreased safety awareness;Impaired balance (sitting and/or standing);Decreased knowledge of use of DME or AE;Impaired UE functional use;Decreased knowledge of precautions   OT Treatment/Interventions: Arnold-care/ADL training;Therapeutic exercise;Therapeutic activities;DME and/or AE instruction;Patient/family education;Manual therapy;Balance training;Modalities      OT Goals(Current goals can be found in the care plan section)   Acute Rehab OT Goals Patient Stated Goal: have less pain OT Goal Formulation: With patient Time For Goal Achievement: 06/01/24 Potential to Achieve Goals: Good ADL Goals Pt Will Perform Upper Body Dressing: with caregiver independent in assisting;with modified independence;sitting Pt Will Perform Lower Body Dressing: with modified independence;sit to/from stand Pt Will Transfer to Toilet: with modified independence;ambulating (LRAD) Additional ADL Goal #1: Pt/caregiver will demo mod indep with LUE brace mgt.   OT Frequency:  Min 2X/week       AM-PAC OT 6 Clicks Daily Activity     Outcome Measure Help from another person eating meals?: None Help from another person taking care of personal grooming?: A Little Help from another person toileting, which includes using toliet, bedpan, or urinal?: A Little Help  from another person bathing (including washing, rinsing, drying)?: A Lot Help from another person to put on and taking off regular upper body clothing?: A Lot Help from another person to put on and taking off regular lower  body clothing?: A Little 6 Click Score: 17   End of Session Nurse Communication: Mobility status;Other (comment) ([facility] to come back with appropriate brace)  Activity Tolerance: Patient limited by pain;Patient tolerated treatment well Patient left: in chair;with call bell/phone within reach;with chair alarm set  OT Visit Diagnosis: Unsteadiness on feet (R26.81);Muscle weakness (generalized) (M62.81);Repeated falls (R29.6);Pain Pain - Right/Left: Left Pain - part of body: Arm                Time: 0914-1000 OT Time Calculation (min): 46 min Charges:  OT General Charges $OT Visit: 1 Visit OT Evaluation $OT Eval Moderate Complexity: 1 Mod OT Treatments $Larmer Care/Home Management : 8-22 mins $Therapeutic Activity: 8-22 mins  Warren SAUNDERS., MPH, MS, OTR/L ascom [phone] 05/18/24, 1:28 PM

## 2024-05-18 NOTE — Plan of Care (Signed)

## 2024-05-18 NOTE — Progress Notes (Signed)
 PROGRESS NOTE    Kim Graham  FMW:[id number] DOB: 1951/01/20 DOA: 05/17/2024 PCP: Vicci Duwaine SQUIBB, DO  Chief Complaint  Patient presents with   [facility]:  Kim Graham is a 73 year old female with chronic pain, COPD, type 2 diabetes, history of HCV status posttreatment, who suffered a fall on 6/12 and subsequently had humeral neck fracture.  At that time she was recommended to follow-up with Ortho trauma but has been unable to do so due to difficulty with transportation and ambulation at home.  On this admission she sustained another fall and laid on the floor for prolonged period of time.  When she presented she was found to have acute rhabdomyolysis with CK 7623, potassium 2.5, mag 1.5, and elevated LFTs.  She refused head CT.  She was also found to have right lower leg cellulitis, and acute swelling of her left arm.  Orthopedic surgery was consulted and again recommended outpatient follow-up with trauma surgery but has placed a new brace on her arm during this admission.  Subjective: No acute events overnight.  When discussing possible rehab or SNF with the patient she is very amenable to this plan.  She believes she needs to work on building up her strength.  Later in the afternoon I was alerted the patient had a change in her mentation by OT.  I went back to reevaluate the patient.  Patient remains A &O x 4, perhaps slightly more fatigued than this morning.  She has no acute complaints.  No localizing deficits. NH4 ordered.   Objective: Vitals:   05/18/24 0411 05/18/24 0724 05/18/24 1118 05/18/24 1200  BP: (!) 108/51 (!) 105/46 114/67   Pulse: 89 83 (!) 102   Resp: 18 18 18 15   Temp: (!) 97.1 F (36.2 C) 98.6 F (37 C) 98.3 F (36.8 C)   TempSrc:      SpO2: 90% 94% (!) 88% 91%    Intake/Output Summary (Last 24 hours) at 05/18/2024 1515 Last data filed at 05/18/2024 1437 Gross per 24 hour  Intake 480 ml  Output 200 ml  Net 280 ml   There were no vitals filed  for this visit.  Examination: General exam: Appears calm and comfortable, NAD  Respiratory system: No work of breathing, symmetric chest wall expansion Cardiovascular system: S1 & S2 heard, RRR.  Gastrointestinal system: Abdomen is nondistended, soft and nontender.  Neuro: Alert and oriented x4. No focal neurological deficits. Extremities: left arm in sling, right leg with abrasion and surrounding erythema  Psychiatry: Demonstrates appropriate judgement and insight. Mood & affect appropriate for situation.   Assessment & Plan:  Principal Problem:   Rhabdomyolysis, traumatic Active Problems:   Fall at home, initial encounter   History of fracture of humerus 04/20/24   Transaminitis   Hypokalemia   Hypomagnesemia   Elevated troponin   Cellulitis of right lower extremity   Chronic obstructive pulmonary disease (HCC)   Methadone  dependence (HCC)   Diabetes mellitus type 2, diet-controlled (HCC)   Closed displaced spiral fracture of shaft of left humerus   Rhabdomyolysis - Secondary to prolonged downtime - Status post IV fluids - CK resolving.  Will discontinue trend - Encourage oral rehydration  Left Humeral shaft fracture - Fracture sustained 6/12.  Was meant to follow-up with Ortho trauma and has not been able to do so - Plain films this admission reveal: Subacute spiral fracture of left humeral shaft and nondisplaced fracture of left proximal humerus.  Rotation is different  from prior films but no significant change in displacement. - Orthopedic surgery consulted, has recommended outpatient follow-up - Has placed new splint  Right leg cellulitis - Localized swelling and erythema to right leg.  Doppler negative for DVT - Has open skin abrasion and surrounding swelling.  Continue ceftriaxone   Frequent falls Generalized deconditioning - Patient has had difficulty managing her health needs at home for some time, further complicated by left arm fracture - PT/OT.  Patient would  benefit from rehab/SNF placement - TOC consulted to assist in this  COPD - Not currently in exacerbation - Continue prior meds  Type 2 diabetes - Sliding scale insulin , titrate as tolerated  Chronic pain Methadone  dependence - Has been verified.  Continue at home dose  History of HCV s/p treatment Transaminitis - Acute elevation in LFTs may be secondary to rhabdo - Resolving now - Trend CMP - Ammonia pending  Hypokalemia Hypomagnesemia - Replace as needed - Trend daily CMP  DVT prophylaxis: Lovenox    Code Status: Full Code Disposition:  Medically ready now, pending SNF placement  Consultants:  Treatment Team:  Consulting Physician: Lorelle Hussar, MD  Procedures:  N/a  Antimicrobials:  Anti-infectives (From admission, onward)    Start     Dose/Rate Route Frequency Ordered Stop   05/18/24 0600  cefTRIAXone  (ROCEPHIN ) 1 g in sodium chloride  0.9 % 100 mL IVPB        1 g 200 mL/hr over 30 Minutes Intravenous Every 24 hours 05/17/24 0346     05/17/24 0300  cefTRIAXone  (ROCEPHIN ) 2 g in sodium chloride  0.9 % 100 mL IVPB        2 g 200 mL/hr over 30 Minutes Intravenous  Once 05/17/24 0248 05/17/24 0519       Data Reviewed: I have personally reviewed following labs and imaging studies CBC: Recent Labs  Lab 05/17/24 0059 05/18/24 0609  WBC 6.6 7.3  NEUTROABS 4.2 4.7  HGB 12.8 11.6*  HCT 40.7 37.9  MCV 94.0 94.3  PLT 158 235   Basic Metabolic Panel: Recent Labs  Lab 05/17/24 0059 05/17/24 0353 05/18/24 0609  NA 144 142 144  K 2.5* 3.0* 3.7  CL 112* 106 109  CO2 24 27 24   GLUCOSE 89 89 94  BUN 14 15 13   CREATININE 0.71 0.79 0.80  CALCIUM 7.1* 8.2* 8.4*  MG 1.5* 1.8 2.1  PHOS  --   --  2.7   GFR: CrCl cannot be calculated (Unknown ideal weight.). Liver Function Tests: Recent Labs  Lab 05/17/24 0059 05/17/24 0353 05/18/24 0609  AST 219* 236* 135*  ALT 64* 73* 61*  ALKPHOS 60 59 61  BILITOT 1.0 0.8 0.8  PROT 4.7* 5.1* 5.3*  ALBUMIN  2.4* 2.5* 2.5*   CBG: No results for input(s): GLUCAP in the last 168 hours.  No results found for this or any previous visit (from the past 240 hours).   Radiology Studies: US  Venous Img Lower Unilateral Right (DVT) Result Date: 05/17/2024 CLINICAL DATA:  Erythema EXAM: RIGHT LOWER EXTREMITY VENOUS DOPPLER ULTRASOUND TECHNIQUE: Gray-scale sonography with compression, as well as color and duplex ultrasound, were performed to evaluate the deep venous system(s) from the level of the common femoral vein through the popliteal and proximal calf veins. COMPARISON:  None Available. FINDINGS: VENOUS Normal compressibility of the common femoral, superficial femoral, and popliteal veins, as well as the visualized calf veins. Visualized portions of profunda femoral vein and great saphenous vein unremarkable. No filling defects to suggest DVT on grayscale or  color Doppler imaging. Doppler waveforms show normal direction of venous flow, normal respiratory plasticity and response to augmentation. Limited views of the contralateral common femoral vein are unremarkable. OTHER Mildly enlarged lymph nodes are present in the right inguinal lymph node basin which may be reactive. Limitations: none IMPRESSION: 1. No evidence of right lower extremity DVT. Electronically Signed   By: Maude Naegeli M.D.   On: 05/17/2024 14:41   DG Humerus Left Result Date: 05/17/2024 CLINICAL DATA:  Left humeral fracture with original injury 04/20/2024, fell again yesterday, with increasing pain. EXAM: LEFT HUMERUS - 2+ VIEW COMPARISON:  AP and lateral left humerus 04/25/2024 FINDINGS: Two views. Fiberglass casting material again is noted. A nondisplaced left humeral neck fracture extending through the greater tuberosity demonstrates no interval displacement. There has been a change in the spiral proximal to mid shaft humeral fracture alignment. There is now 4.3 cm of over-riding towards the anteromedial aspect of the distal fragment relative to  the proximal fragment. The distal fragment is displaced anteriorly up to 2/3 of the bone width, medially up to 1/2 of a shaft width, with slight anterior and medial angulation of the distal fragment with improvement in the degree of angulation. No healing callus is yet seen. There is osteopenia without evidence of new fractures or primary pathologic process. Unchanged mild swelling in the upper arm soft tissues. IMPRESSION: 1. Change in the spiral proximal to mid shaft humeral fracture alignment, with 4.3 cm of over-riding towards the anteromedial aspect of the distal fragment relative to the proximal fragment. 2. The distal fragment is displaced anteriorly up to 2/3 of the bone width, medially up to 1/2 of a shaft width, with slight anterior and medial angulation of the distal fragment with improvement in the degree of angulation. 3. No interval displacement of the nondisplaced left humeral neck fracture extending through the greater tuberosity. 4. Osteopenia. Electronically Signed   By: Francis Quam M.D.   On: 05/17/2024 02:33   DG HIPS BILAT WITH PELVIS MIN 5 VIEWS Result Date: 05/17/2024 CLINICAL DATA:  Fall, bruising EXAM: DG HIP (WITH OR WITHOUT PELVIS) 5+V BILAT COMPARISON:  None Available. FINDINGS: There is no evidence of hip fracture or dislocation. There is no evidence of arthropathy or other focal bone abnormality. IMPRESSION: Negative. Electronically Signed   By: Franky Crease M.D.   On: 05/17/2024 02:27   DG Tibia/Fibula Right Result Date: 05/17/2024 CLINICAL DATA:  Fall, bruising EXAM: RIGHT TIBIA AND FIBULA - 2 VIEW COMPARISON:  None Available. FINDINGS: There is no evidence of fracture or other focal bone lesions. Soft tissues are unremarkable. IMPRESSION: Negative. Electronically Signed   By: Franky Crease M.D.   On: 05/17/2024 02:26   DG Hand 2 View Left Result Date: 05/17/2024 CLINICAL DATA:  Fall, bruising EXAM: LEFT HAND - 2 VIEW COMPARISON:  None Available. FINDINGS: There is no evidence  of fracture or dislocation. There is no evidence of arthropathy or other focal bone abnormality. Soft tissues are unremarkable. IMPRESSION: Negative. Electronically Signed   By: Franky Crease M.D.   On: 05/17/2024 02:26   DG Forearm Left Result Date: 05/17/2024 CLINICAL DATA:  Fall, bruising, pain EXAM: LEFT FOREARM - 2 VIEW COMPARISON:  None Available. FINDINGS: There is no evidence of fracture or other focal bone lesions. Soft tissues are unremarkable. IMPRESSION: No acute bony abnormality within the left forearm. Electronically Signed   By: Franky Crease M.D.   On: 05/17/2024 02:25    Scheduled Meds:  ARIPiprazole   30 mg  Oral Daily   budesonide -glycopyrrolate -formoterol   2 puff Inhalation BID   enoxaparin  (LOVENOX ) injection  40 mg Subcutaneous Q24H   methadone   100 mg Oral Daily   Continuous Infusions:  cefTRIAXone  (ROCEPHIN )  IV 1 g (05/18/24 0537)     LOS: 1 day  MDM: Patient is high risk for one or more organ failure.  They necessitate ongoing hospitalization for continued IV therapies and subsequent lab monitoring. Total time spent interpreting labs and vitals, reviewing the medical record, coordinating care amongst consultants and care team members, directly assessing and discussing care with the patient and/or family: 55 min Jelani Vreeland, DO [facility]  To contact the attending physician between 7A-7P please use Epic Chat. To contact the covering physician during after hours 7P-7A, please review Amion.  05/18/2024, 3:15 PM   *This document has been created with the assistance of dictation software. Please excuse typographical errors. *

## 2024-05-18 NOTE — Progress Notes (Signed)
 PT Cancellation Note  Patient Details Name: Kim Graham MRN: [id number] DOB: [birth year]   Cancelled Treatment:    Reason Eval/Treat Not Completed: Other (comment). Patient waiting on correct arm brace, PT to re-attempt as able.   Doyal Shams PT, DPT 9:57 AM,05/18/24

## 2024-05-18 NOTE — Evaluation (Signed)
 Physical Therapy Evaluation Patient Details Name: Kim Graham MRN: [id number] DOB: 05-Feb-1951 Today's Date: 05/18/2024  History of Present Illness  Pt 73 y.o. female who presented after a fall on 04/20/2024 to the emergency room was found to have a left humeral shaft fracture.  Patient was placed in a splint and seen outpatient after that.  She had another fall approximately 12 hours ago and was down for about 5 hours in the floor. Reports having L hand weakness. PMH of COPD, depression, DM.   Clinical Impression  Patient alert, agreeable to PT. Oriented to Circle, able to provide some situational information. Did demonstrate lethargy, falling asleep mid conversation. Reported 8/10 shoulder pain. Majority of session to adjust bracing. Supine to sit with minA. Sit <> Stand with handheld assist and minA, only able to ambulate ~62ft prior to needing to sit and rest due to fatigue. Noted for L lateral lean (towards humerus fx) in supine as well as sitting, propped as able to help correct. MD notified of pt cognitive status and mobility status.  Overall the patient demonstrated deficits (see PT Problem List) that impede the patient's functional abilities, safety, and mobility and would benefit from skilled PT intervention.          If plan is discharge home, recommend the following: A lot of help with walking and/or transfers;A lot of help with bathing/dressing/bathroom;Assistance with feeding;Assist for transportation;Assistance with cooking/housework;Help with stairs or ramp for entrance;Direct supervision/assist for medications management   Can travel by private vehicle   No    Equipment Recommendations Other (comment) (TBD)  Recommendations for Other Services       Functional Status Assessment Patient has had a recent decline in their functional status and demonstrates the ability to make significant improvements in function in a reasonable and predictable amount of time.     Precautions /  Restrictions Precautions Precautions: Fall;Other (comment) Recall of Precautions/Restrictions: Impaired Precaution/Restrictions Comments: LUE in brace - pending fitting by [employer] Required Braces or Orthoses: Other Brace Other Brace: Sarmiento brace per Ortho Restrictions Weight Bearing Restrictions Per Provider Order: Yes LUE Weight Bearing Per Provider Order: Non weight bearing Other Position/Activity Restrictions: no official order in chart, but given fracture assuming NWBing at this time.      Mobility  Bed Mobility Overal bed mobility: Needs Assistance Bed Mobility: Supine to Sit     Supine to sit: Min assist, HOB elevated, Used rails          Transfers Overall transfer level: Needs assistance Equipment used: 1 person hand held assist Transfers: Sit to/from Stand Sit to Stand: Contact guard assist                Ambulation/Gait Ambulation/Gait assistance: Min assist Gait Distance (Feet): 15 Feet Assistive device: 1 person hand held assist         General Gait Details: pt fatigued and unable to ambulate further  Stairs            Wheelchair Mobility     Tilt Bed    Modified Rankin (Stroke Patients Only)       Balance Overall balance assessment: Needs assistance Sitting-balance support: Feet supported Sitting balance-Leahy Scale: Poor Sitting balance - Comments: L lateral lean noted   Standing balance support: Single extremity supported, During functional activity Standing balance-Leahy Scale: Poor                               Pertinent  Vitals/Pain Pain Assessment Pain Assessment: 0-10 Pain Score: 7  Pain Location: LUE Pain Descriptors / Indicators: Aching Pain Intervention(s): Limited activity within patient's tolerance, Monitored during session, Repositioned    Home Living Family/patient expects to be discharged to:: Private residence Living Arrangements: Alone   Type of Home: Mobile home Home Access: Stairs to  enter Entrance Stairs-Rails: Doctor, general practice of Steps: 7   Home Layout: One level Home Equipment: Cane - single point      Prior Function Prior Level of Function : History of Falls (last six months);Driving;Independent/Modified Independent             Mobility Comments: indep ADLs Comments: before initial fall/fracture, indep + driving; since fracture mod indep with PRN assist for ADL and IADL from boyfriend including driving her     Extremity/Trunk Assessment   Upper Extremity Assessment Upper Extremity Assessment: Defer to OT evaluation    Lower Extremity Assessment Lower Extremity Assessment: Generalized weakness (able to move BLE against gravity)       Communication        Cognition Arousal: Alert, Lethargic Behavior During Therapy: WFL for tasks assessed/performed, Flat affect                           PT - Cognition Comments: pt falling asleep mid conversation. MD notified of pt alertness Following commands: Intact       Cueing Cueing Techniques: Verbal cues     General Comments      Exercises     Assessment/Plan    PT Assessment Patient needs continued PT services  PT Problem List Decreased strength;Decreased range of motion;Pain;Decreased activity tolerance;Decreased knowledge of use of DME;Decreased balance;Decreased mobility;Decreased knowledge of precautions       PT Treatment Interventions DME instruction;Balance training;Neuromuscular re-education;Gait training;Stair training;Functional mobility training;Patient/family education;Therapeutic activities;Therapeutic exercise    PT Goals (Current goals can be found in the Care Plan section)  Acute Rehab PT Goals Patient Stated Goal: to feel better PT Goal Formulation: With patient Time For Goal Achievement: 06/01/24 Potential to Achieve Goals: Good    Frequency Min 3X/week     Co-evaluation               AM-PAC PT 6 Clicks Mobility  Outcome  Measure Help needed turning from your back to your side while in a flat bed without using bedrails?: A Lot Help needed moving from lying on your back to sitting on the side of a flat bed without using bedrails?: A Lot Help needed moving to and from a bed to a chair (including a wheelchair)?: A Lot Help needed standing up from a chair using your arms (e.g., wheelchair or bedside chair)?: A Lot Help needed to walk in hospital room?: A Lot Help needed climbing 3-5 steps with a railing? : Total 6 Click Score: 11    End of Session   Activity Tolerance: Patient limited by fatigue Patient left: in bed;with call bell/phone within reach;in chair;with chair alarm set Nurse Communication: Mobility status PT Visit Diagnosis: Other abnormalities of gait and mobility (R26.89);Difficulty in walking, not elsewhere classified (R26.2);Muscle weakness (generalized) (M62.81);Pain Pain - Right/Left: Left Pain - part of body: Shoulder    Time: 1417-1450 PT Time Calculation (min) (ACUTE ONLY): 33 min   Charges:   PT Evaluation $PT Eval Low Complexity: 1 Low PT Treatments $Therapeutic Activity: 23-37 mins PT General Charges $$ ACUTE PT VISIT: 1 Visit  Doyal Shams PT, DPT 4:20 PM,05/18/24

## 2024-05-18 NOTE — Consult Note (Signed)
 WOC Nurse Consult Note: patient had pilonidal cyst removal by Dr. Marinda 12/29/2023; has been followed in his office, was initially using Aquacel AG then at last visit 03/23/2024 switched to just dry gauze daily  Reason for Consult: wound post abscess  Wound type: full thickness surgical as above  Pressure Injury POA: NA not pressure  Measurement: see nursing flowsheet; last note Dr. Marinda 3 cm x 1 cm x 2.5 cm 03/23/2024  Wound azi:jeezjmd to have some tan fibrinous tissue  Drainage (amount, consistency, odor) see nursing flowsheet  Periwound:mild maceration  Dressing procedure/placement/frequency: Cleanse gluteal cleft wound with Vashe wound cleanser Soila [phone]) do not rinse and allow to air dry.  Using a Q tip applicator insert silver hydrofiber (Aquacel ANDRIA Collum [id number]) into wound bed daily, cover with dry gauze and secure with silicone foam or ABD pad whichever is preferred.   POC discussed with bedside nurse. WOC team will not follow.  Patient should continue ongoing postop follow-up with Dr. Marinda and resume dry gauze at discharge as he prescribed.   Thank you,    Powell Bar MSN, RN-BC, [employer]

## 2024-05-18 NOTE — Progress Notes (Signed)
 Mobility Specialist - Progress Note   05/18/24 1353  Mobility  Activity Transferred to/from [facility]  Level of Assistance Contact guard assist, steadying assist  Assistive Device None (HHA of the RUE)  Distance Ambulated (ft) 8 ft  LUE Weight Bearing Per Provider Order NWB  Activity Response Tolerated well  Mobility visit 1 Mobility  Mobility Specialist Start Time (ACUTE ONLY) 1141  Mobility Specialist Stop Time (ACUTE ONLY) 1151  Mobility Specialist Time Calculation (min) (ACUTE ONLY) 10 min   MS responding to Pt calling out help. Pt sitting in the recliner, requesting to transfer to the [facility]. Pt STS from recliner MinA and transferred to the [facility] CGA via SPT. Pt STS from [facility] MinA, completed peri care indep and returned to the recliner. Pt left seated with alarm set and needs within reach.  America Silvan Mobility Specialist 05/18/24 1:59 PM

## 2024-05-19 DIAGNOSIS — Z8781 Personal history of (healed) traumatic fracture: Secondary | ICD-10-CM | POA: Diagnosis not present

## 2024-05-19 DIAGNOSIS — S42342S Displaced spiral fracture of shaft of humerus, left arm, sequela: Secondary | ICD-10-CM

## 2024-05-19 DIAGNOSIS — R7401 Elevation of levels of liver transaminase levels: Secondary | ICD-10-CM

## 2024-05-19 DIAGNOSIS — S42342D Displaced spiral fracture of shaft of humerus, left arm, subsequent encounter for fracture with routine healing: Secondary | ICD-10-CM

## 2024-05-19 DIAGNOSIS — E119 Type 2 diabetes mellitus without complications: Secondary | ICD-10-CM | POA: Diagnosis not present

## 2024-05-19 LAB — CBC WITH DIFFERENTIAL/PLATELET
Abs Immature Granulocytes: 0.02 K/uL (ref 0.00–0.07)
Basophils Absolute: 0 K/uL (ref 0.0–0.1)
Basophils Relative: 0 %
Eosinophils Absolute: 0.1 K/uL (ref 0.0–0.5)
Eosinophils Relative: 2 %
HCT: 34.3 % — ABNORMAL LOW (ref 36.0–46.0)
Hemoglobin: 10.6 g/dL — ABNORMAL LOW (ref 12.0–15.0)
Immature Granulocytes: 0 %
Lymphocytes Relative: 23 %
Lymphs Abs: 1.3 K/uL (ref 0.7–4.0)
MCH: 28.8 pg (ref 26.0–34.0)
MCHC: 30.9 g/dL (ref 30.0–36.0)
MCV: 93.2 fL (ref 80.0–100.0)
Monocytes Absolute: 0.7 K/uL (ref 0.1–1.0)
Monocytes Relative: 12 %
Neutro Abs: 3.6 K/uL (ref 1.7–7.7)
Neutrophils Relative %: 63 %
Platelets: 205 K/uL (ref 150–400)
RBC: 3.68 MIL/uL — ABNORMAL LOW (ref 3.87–5.11)
RDW: 16 % — ABNORMAL HIGH (ref 11.5–15.5)
WBC: 5.8 K/uL (ref 4.0–10.5)
nRBC: 0 % (ref 0.0–0.2)

## 2024-05-19 LAB — COMPREHENSIVE METABOLIC PANEL WITH GFR
ALT: 54 U/L — ABNORMAL HIGH (ref 0–44)
AST: 93 U/L — ABNORMAL HIGH (ref 15–41)
Albumin: 2.3 g/dL — ABNORMAL LOW (ref 3.5–5.0)
Alkaline Phosphatase: 50 U/L (ref 38–126)
Anion gap: 7 (ref 5–15)
BUN: 10 mg/dL (ref 8–23)
CO2: 29 mmol/L (ref 22–32)
Calcium: 8.4 mg/dL — ABNORMAL LOW (ref 8.9–10.3)
Chloride: 106 mmol/L (ref 98–111)
Creatinine, Ser: 0.59 mg/dL (ref 0.44–1.00)
GFR, Estimated: >60 mL/min (ref >60)
Glucose, Bld: 96 mg/dL (ref 70–99)
Potassium: 3.3 mmol/L — ABNORMAL LOW (ref 3.5–5.1)
Sodium: 142 mmol/L (ref 135–145)
Total Bilirubin: 0.6 mg/dL (ref 0.0–1.2)
Total Protein: 5.4 g/dL — ABNORMAL LOW (ref 6.5–8.1)

## 2024-05-19 LAB — PHOSPHORUS: Phosphorus: 2.9 mg/dL (ref 2.5–4.6)

## 2024-05-19 LAB — MAGNESIUM: Magnesium: 1.9 mg/dL (ref 1.7–2.4)

## 2024-05-19 MED ORDER — POTASSIUM CHLORIDE CRYS ER 20 MEQ PO TBCR
40.0000 meq | EXTENDED_RELEASE_TABLET | Freq: Once | ORAL | Status: AC
  Administered 2024-05-19: 40 meq via ORAL
  Filled 2024-05-19: qty 2

## 2024-05-19 NOTE — Plan of Care (Signed)
   Problem: Education: Goal: Knowledge of General Education information will improve Description Including pain rating scale, medication(s)/side effects and non-pharmacologic comfort measures Outcome: Progressing

## 2024-05-19 NOTE — Progress Notes (Signed)
 PROGRESS NOTE    Kim Graham  FMW:[id number] DOB: Sep 07, 1951 DOA: 05/17/2024 PCP: Vicci Duwaine SQUIBB, DO  Chief Complaint  Patient presents with   [facility] Course:  Kim Graham is a 73 year old female with chronic pain, COPD, type 2 diabetes, history of HCV status posttreatment, who suffered a fall on 6/12 and subsequently had humeral neck fracture.  At that time she was recommended to follow-up with Ortho trauma but has been unable to do so due to difficulty with transportation and ambulation at home.  On this admission she sustained another fall and laid on the floor for prolonged period of time.  When she presented she was found to have acute rhabdomyolysis with CK 7623, potassium 2.5, mag 1.5, and elevated LFTs.  She refused head CT.  She was also found to have right lower leg cellulitis, and acute swelling of her left arm.  Orthopedic surgery was consulted and again recommended outpatient follow-up with trauma surgery but has placed a new brace on her arm during this admission.  Subjective: No acute events overnight.  Patient remains stable.  Pain is better controlled today.  Objective: Vitals:   05/19/24 0357 05/19/24 0731 05/19/24 1154 05/19/24 1511  BP: (!) 114/49 (!) 113/53 (!) 117/49 (!) 92/59  Pulse: 81 78 96 86  Resp: 17 16 16 15   Temp: 99.1 F (37.3 C) 97.8 F (36.6 C) 99.2 F (37.3 C) 98.9 F (37.2 C)  TempSrc:  Oral    SpO2: 93% 99% 95% 95%    Intake/Output Summary (Last 24 hours) at 05/19/2024 1722 Last data filed at 05/19/2024 0900 Gross per 24 hour  Intake 240 ml  Output --  Net 240 ml   There were no vitals filed for this visit.  Examination: General exam: Appears calm and comfortable, NAD  Respiratory system: No work of breathing, symmetric chest wall expansion Cardiovascular system: S1 & S2 heard, RRR.  Gastrointestinal system: Abdomen is nondistended, soft and nontender.  Neuro: Alert and oriented x4. No focal neurological  deficits. Extremities: left arm in sling, right leg with abrasion and surrounding erythema  Psychiatry: Demonstrates appropriate judgement and insight. Mood & affect appropriate for situation.   Assessment & Plan:  Principal Problem:   Rhabdomyolysis, traumatic Active Problems:   Fall at home, initial encounter   History of fracture of humerus 04/20/24   Transaminitis   Hypokalemia   Hypomagnesemia   Elevated troponin   Cellulitis of right lower extremity   Chronic obstructive pulmonary disease (HCC)   Methadone  dependence (HCC)   Diabetes mellitus type 2, diet-controlled (HCC)   Closed displaced spiral fracture of shaft of left humerus   Rhabdomyolysis - Secondary to prolonged downtime - Status post IV fluids - CK resolving.  Will discontinue trend - Continue to encourage oral rehydration.  Left Humeral shaft fracture - Fracture sustained 6/12.  Was meant to follow-up with Ortho trauma and has not been able to do so - Plain films this admission reveal: Subacute spiral fracture of left humeral shaft and nondisplaced fracture of left proximal humerus.  Rotation is different from prior films but no significant change in displacement. - Orthopedic surgery consulted, has recommended outpatient follow-up - Has placed new splint - Difficulty maintaining her health needs at home.  Would benefit from SNF placement.  TOC consulted and aware  Right leg cellulitis - Localized swelling and erythema to right leg.  Doppler negative for DVT - Has open skin abrasion and surrounding swelling.  Improving.  Continue ceftriaxone .  Transition to Keflex  at discharge  Frequent falls Generalized deconditioning - Patient has had difficulty managing her health needs at home for some time, further complicated by left arm fracture - PT/OT.  Patient would benefit from rehab/SNF placement - TOC consulted to assist in this  COPD - Not currently in exacerbation - Continue prior meds  Type 2  diabetes - Sliding scale insulin , titrate as tolerated  Chronic pain Methadone  dependence - Has been verified.  Continue at home dose  History of HCV s/p treatment Transaminitis - Acute elevation in LFTs may be secondary to rhabdo - Resolving now - Trend CMP - Ammonia within normal limits.  Hypokalemia Hypomagnesemia - Replace daily.  Trend CMP. ] DVT prophylaxis: Lovenox    Code Status: Full Code Disposition:  Medically ready now, pending SNF placement  Consultants:  Treatment Team:  Consulting Physician: Lorelle Hussar, MD  Procedures:  N/a  Antimicrobials:  Anti-infectives (From admission, onward)    Start     Dose/Rate Route Frequency Ordered Stop   05/18/24 0600  cefTRIAXone  (ROCEPHIN ) 1 g in sodium chloride  0.9 % 100 mL IVPB        1 g 200 mL/hr over 30 Minutes Intravenous Every 24 hours 05/17/24 0346     05/17/24 0300  cefTRIAXone  (ROCEPHIN ) 2 g in sodium chloride  0.9 % 100 mL IVPB        2 g 200 mL/hr over 30 Minutes Intravenous  Once 05/17/24 0248 05/17/24 0519       Data Reviewed: I have personally reviewed following labs and imaging studies CBC: Recent Labs  Lab 05/17/24 0059 05/18/24 0609 05/19/24 0559  WBC 6.6 7.3 5.8  NEUTROABS 4.2 4.7 3.6  HGB 12.8 11.6* 10.6*  HCT 40.7 37.9 34.3*  MCV 94.0 94.3 93.2  PLT 158 235 205   Basic Metabolic Panel: Recent Labs  Lab 05/17/24 0059 05/17/24 0353 05/18/24 0609 05/19/24 0559  NA 144 142 144 142  K 2.5* 3.0* 3.7 3.3*  CL 112* 106 109 106  CO2 24 27 24 29   GLUCOSE 89 89 94 96  BUN 14 15 13 10   CREATININE 0.71 0.79 0.80 0.59  CALCIUM 7.1* 8.2* 8.4* 8.4*  MG 1.5* 1.8 2.1 1.9  PHOS  --   --  2.7 2.9   GFR: CrCl cannot be calculated (Unknown ideal weight.). Liver Function Tests: Recent Labs  Lab 05/17/24 0059 05/17/24 0353 05/18/24 0609 05/19/24 0559  AST 219* 236* 135* 93*  ALT 64* 73* 61* 54*  ALKPHOS 60 59 61 50  BILITOT 1.0 0.8 0.8 0.6  PROT 4.7* 5.1* 5.3* 5.4*  ALBUMIN 2.4*  2.5* 2.5* 2.3*   CBG: No results for input(s): GLUCAP in the last 168 hours.  No results found for this or any previous visit (from the past 240 hours).   Radiology Studies: No results found.   Scheduled Meds:  ARIPiprazole   30 mg Oral Daily   budesonide -glycopyrrolate -formoterol   2 puff Inhalation BID   enoxaparin  (LOVENOX ) injection  40 mg Subcutaneous Q24H   methadone   100 mg Oral Daily   Continuous Infusions:  cefTRIAXone  (ROCEPHIN )  IV 1 g (05/19/24 0622)     LOS: 2 days  MDM: Patient is high risk for one or more organ failure.  They necessitate ongoing hospitalization for continued IV therapies and subsequent lab monitoring. Total time spent interpreting labs and vitals, reviewing the medical record, coordinating care amongst consultants and care team members, directly assessing and discussing care with the patient and/or family:  55 min Jordain Radin, DO [facility]  To contact the attending physician between 7A-7P please use Epic Chat. To contact the covering physician during after hours 7P-7A, please review Amion.  05/19/2024, 5:22 PM   *This document has been created with the assistance of dictation software. Please excuse typographical errors. *

## 2024-05-19 NOTE — Care Management Important Message (Signed)
 Important Message  Patient Details  Name: Kim Graham MRN: [id number] Date of Birth: 07/18/1951   Important Message Given:  Yes - Medicare IM     [name] 05/19/2024, 1:50 PM

## 2024-05-19 NOTE — Plan of Care (Signed)

## 2024-05-19 NOTE — Progress Notes (Signed)
 Occupational Therapy Treatment Patient Details Name: Kim Graham MRN: [id number] DOB: 10/23/1951 Today's Date: 05/19/2024   History of present illness Pt 73 y.o. female who presented after a fall on 04/20/2024 to the emergency room was found to have a left humeral shaft fracture.  Patient was placed in a splint and seen outpatient after that.  She had another fall and was down for about 5 hours in the floor. Reports having L hand weakness. PMH of COPD, depression, DM.   OT comments  Pt seen for OT tx. Pt lethargic, wakes briefly with VC throughout session. Slow to complete grooming task. OT evaluated braces positioning, skin integrity, and positioning. Rolled washcloth placed in L hand to improve more neutral functional hand/finger positioning as she has tendency to keep fingers flexed. Pt endorsing LUE pain, RN notified. Will continue to progress as able.       If plan is discharge home, recommend the following:  A little help with walking and/or transfers;A lot of help with bathing/dressing/bathroom;Direct supervision/assist for medications management;Assist for transportation;Assistance with cooking/housework;Help with stairs or ramp for entrance   Equipment Recommendations  Other (comment) (defer)       Precautions / Restrictions Precautions Precautions: Fall;Other (comment) Recall of Precautions/Restrictions: Impaired Precaution/Restrictions Comments: LUE brace, wrist carpal tunnel brace Required Braces or Orthoses: Other Brace Other Brace: Sarmiento brace per Ortho, wrist carpel tunnel brace Restrictions Weight Bearing Restrictions Per Provider Order: Yes LUE Weight Bearing Per Provider Order: Non weight bearing Other Position/Activity Restrictions: no official order in chart, but given fracture assuming NWBing at this time.       Mobility Bed Mobility        General bed mobility comments: deferred 2/2 lethargy    Transfers              ADL either performed or  assessed with clinical judgement   ADL Overall ADL's : Needs assistance/impaired     Grooming: Bed level;Sitting;Wash/dry face;Set up;Cueing for sequencing Grooming Details (indicate cue type and reason): long sitting in bed, set up and increased time to complete, intermittent VC to redirect to task and improve alertness          Communication Communication Communication: No apparent difficulties   Cognition Arousal: Lethargic Behavior During Therapy: Flat affect Cognition: No family/caregiver present to determine baseline             OT - Cognition Comments: lethargic, oriented x3, requires VC to improve alertness, slow processing                 Following commands: Impaired Following commands impaired: Follows one step commands with increased time      Cueing   Cueing Techniques: Verbal cues, Tactile cues  Exercises Other Exercises Other Exercises: OT evaluated braces positioning, skin integrity, and positioning. Rolled washcloth placed in L hand to improve more neutral functional hand/finger positioning as she has tendency to keep fingers flexed            Pertinent Vitals/ Pain       Pain Assessment Pain Assessment: 0-10 Pain Score: 6  Pain Location: LUE Pain Descriptors / Indicators: Guarding Pain Intervention(s): Limited activity within patient's tolerance, Monitored during session, Repositioned, Patient requesting pain meds-RN notified   Frequency  Min 2X/week        Progress Toward Goals  OT Goals(current goals can now be found in the care plan section)  Progress towards OT goals: Progressing toward goals  Acute Rehab OT Goals Patient Stated Goal: have  less pain OT Goal Formulation: With patient Time For Goal Achievement: 06/01/24 Potential to Achieve Goals: Good  Plan         AM-PAC OT 6 Clicks Daily Activity     Outcome Measure   Help from another person eating meals?: A Little Help from another person taking care of personal  grooming?: A Little Help from another person toileting, which includes using toliet, bedpan, or urinal?: A Little Help from another person bathing (including washing, rinsing, drying)?: A Lot Help from another person to put on and taking off regular upper body clothing?: A Lot Help from another person to put on and taking off regular lower body clothing?: A Little 6 Click Score: 16    End of Session    OT Visit Diagnosis: Unsteadiness on feet (R26.81);Muscle weakness (generalized) (M62.81);Repeated falls (R29.6);Pain Pain - Right/Left: Left Pain - part of body: Arm   Activity Tolerance Patient limited by lethargy   Patient Left in bed;with call bell/phone within reach;with bed alarm set   Nurse Communication Patient requests pain meds        Time: 8445-8392 OT Time Calculation (min): 13 min  Charges: OT General Charges $OT Visit: 1 Visit OT Treatments $Therapeutic Activity: 8-22 mins  Warren SAUNDERS., MPH, MS, OTR/L ascom [phone] 05/19/24, 4:09 PM

## 2024-05-19 NOTE — Progress Notes (Signed)
 Mobility Specialist - Progress Note   05/19/24 1109  Mobility  Activity Transferred to/from [facility];Ambulated with assistance in room  Level of Assistance Contact guard assist, steadying assist  Assistive Device None  Distance Ambulated (ft) 4 ft  Activity Response Tolerated well  Mobility visit 1 Mobility  Mobility Specialist Start Time (ACUTE ONLY) 1044  Mobility Specialist Stop Time (ACUTE ONLY) 1101  Mobility Specialist Time Calculation (min) (ACUTE ONLY) 17 min   Pt on the BSC upon entry, utilizing [place]. Pt required MaxA to doff/don gown and sling, requiring MinA for peri care. Pt STS and transferred to bed w/ CGA HHA, taking multipule lateral and backwards steps towards the [facility]. Pt left supine with alarm set and needs within reach.  America Silvan Mobility Specialist 05/19/24 11:16 AM

## 2024-05-19 NOTE — Progress Notes (Signed)
 Physical Therapy Treatment Patient Details Name: Kim Graham MRN: [id number] DOB: [birth year] Today's Date: 05/19/2024   History of Present Illness Pt 73 y.o. female who presented after a fall on 04/20/2024 to the emergency room was found to have a left humeral shaft fracture.  Patient was placed in a splint and seen outpatient after that.  She had another fall and was down for about 5 hours in the floor. Reports having L hand weakness. PMH of COPD, depression, DM.    PT Comments  Patient much more alert this session, able to follow commands well and hold a conversation. Pt required minA for all activities today including bed mobility, transfers, and ambulation. Educated on pericare to maximize independence on [facility] with CGA for safety. Pt reported removing her [place] due to it irritating her. spO2 in bed 86%, improved while sitting on BSC to 90%, but returned to 86% after ambulating and up in chair, [place] donned and RN aware. The patient would benefit from further skilled PT intervention to continue to progress towards goals.    If plan is discharge home, recommend the following: A lot of help with walking and/or transfers;A lot of help with bathing/dressing/bathroom;Assistance with feeding;Assist for transportation;Assistance with cooking/housework;Help with stairs or ramp for entrance;Direct supervision/assist for medications management   Can travel by private vehicle     No  Equipment Recommendations  Other (comment) (TBD)    Recommendations for Other Services       Precautions / Restrictions Precautions Precautions: Fall;Other (comment) Recall of Precautions/Restrictions: Impaired Precaution/Restrictions Comments: LUE brace, wrist carpal tunnel brace Other Brace: Sarmiento brace per Ortho, wrist carpel tunnel brace Restrictions Weight Bearing Restrictions Per Provider Order: Yes LUE Weight Bearing Per Provider Order: Non weight bearing Other Position/Activity Restrictions: no official order  in chart, but given fracture assuming NWBing at this time.     Mobility  Bed Mobility Overal bed mobility: Needs Assistance Bed Mobility: Supine to Sit     Supine to sit: Min assist, HOB elevated, Used rails          Transfers Overall transfer level: Needs assistance Equipment used: 1 person hand held assist Transfers: Sit to/from Stand Sit to Stand: Contact guard assist, Min assist           General transfer comment: minA from EOB, CGA from [facility]    Ambulation/Gait Ambulation/Gait assistance: Min assist, Contact guard assist Gait Distance (Feet): 5 Feet Assistive device: 1 person hand held assist         General Gait Details: cues needed for safe environment navigation, steadying assist intermittently   Stairs             Wheelchair Mobility     Tilt Bed    Modified Rankin (Stroke Patients Only)       Balance Overall balance assessment: Needs assistance Sitting-balance support: Feet supported Sitting balance-Leahy Scale: Poor Sitting balance - Comments: L lateral lean noted   Standing balance support: Single extremity supported, During functional activity Standing balance-Leahy Scale: Poor                              Communication    Cognition Arousal: Alert Behavior During Therapy: Flat affect, WFL for tasks assessed/performed                           PT - Cognition Comments: much improved alertness today Following commands: Intact  Cueing    Exercises      General Comments        Pertinent Vitals/Pain Pain Assessment Pain Assessment: Faces Faces Pain Scale: Hurts a little bit Pain Location: LUE Pain Descriptors / Indicators: Grimacing, Guarding Pain Intervention(s): Limited activity within patient's tolerance, Monitored during session, Repositioned    Home Living                          Prior Function            PT Goals (current goals can now be found in the care plan  section) Progress towards PT goals: Progressing toward goals    Frequency    Min 3X/week      PT Plan      Co-evaluation              AM-PAC PT 6 Clicks Mobility   Outcome Measure  Help needed turning from your back to your side while in a flat bed without using bedrails?: A Lot Help needed moving from lying on your back to sitting on the side of a flat bed without using bedrails?: A Lot Help needed moving to and from a bed to a chair (including a wheelchair)?: A Lot Help needed standing up from a chair using your arms (e.g., wheelchair or bedside chair)?: A Lot Help needed to walk in hospital room?: A Lot Help needed climbing 3-5 steps with a railing? : Total 6 Click Score: 11    End of Session   Activity Tolerance: Patient limited by fatigue Patient left: with call bell/phone within reach;in chair;with chair alarm set Nurse Communication: Mobility status PT Visit Diagnosis: Other abnormalities of gait and mobility (R26.89);Difficulty in walking, not elsewhere classified (R26.2);Muscle weakness (generalized) (M62.81);Pain Pain - Right/Left: Left Pain - part of body: Shoulder     Time: 9097-9084 PT Time Calculation (min) (ACUTE ONLY): 13 min  Charges:    $Therapeutic Activity: 8-22 mins PT General Charges $$ ACUTE PT VISIT: 1 Visit                     Doyal Shams PT, DPT 9:50 AM,05/19/24

## 2024-05-20 DIAGNOSIS — L03115 Cellulitis of right lower limb: Secondary | ICD-10-CM | POA: Diagnosis not present

## 2024-05-20 DIAGNOSIS — R7989 Other specified abnormal findings of blood chemistry: Secondary | ICD-10-CM | POA: Diagnosis not present

## 2024-05-20 DIAGNOSIS — F1120 Opioid dependence, uncomplicated: Secondary | ICD-10-CM | POA: Diagnosis not present

## 2024-05-20 DIAGNOSIS — E876 Hypokalemia: Secondary | ICD-10-CM | POA: Diagnosis not present

## 2024-05-20 LAB — CBC WITH DIFFERENTIAL/PLATELET
Abs Immature Granulocytes: 0.02 K/uL (ref 0.00–0.07)
Basophils Absolute: 0 K/uL (ref 0.0–0.1)
Basophils Relative: 0 %
Eosinophils Absolute: 0.2 K/uL (ref 0.0–0.5)
Eosinophils Relative: 4 %
HCT: 35.3 % — ABNORMAL LOW (ref 36.0–46.0)
Hemoglobin: 10.6 g/dL — ABNORMAL LOW (ref 12.0–15.0)
Immature Granulocytes: 1 %
Lymphocytes Relative: 23 %
Lymphs Abs: 0.9 K/uL (ref 0.7–4.0)
MCH: 28.7 pg (ref 26.0–34.0)
MCHC: 30 g/dL (ref 30.0–36.0)
MCV: 95.7 fL (ref 80.0–100.0)
Monocytes Absolute: 0.5 K/uL (ref 0.1–1.0)
Monocytes Relative: 13 %
Neutro Abs: 2.3 K/uL (ref 1.7–7.7)
Neutrophils Relative %: 59 %
Platelets: 194 K/uL (ref 150–400)
RBC: 3.69 MIL/uL — ABNORMAL LOW (ref 3.87–5.11)
RDW: 16.1 % — ABNORMAL HIGH (ref 11.5–15.5)
WBC: 3.8 K/uL — ABNORMAL LOW (ref 4.0–10.5)
nRBC: 0 % (ref 0.0–0.2)

## 2024-05-20 LAB — COMPREHENSIVE METABOLIC PANEL WITH GFR
ALT: 55 U/L — ABNORMAL HIGH (ref 0–44)
AST: 75 U/L — ABNORMAL HIGH (ref 15–41)
Albumin: 2.4 g/dL — ABNORMAL LOW (ref 3.5–5.0)
Alkaline Phosphatase: 51 U/L (ref 38–126)
Anion gap: 5 (ref 5–15)
BUN: 12 mg/dL (ref 8–23)
CO2: 29 mmol/L (ref 22–32)
Calcium: 8.6 mg/dL — ABNORMAL LOW (ref 8.9–10.3)
Chloride: 110 mmol/L (ref 98–111)
Creatinine, Ser: 0.68 mg/dL (ref 0.44–1.00)
GFR, Estimated: >60 mL/min (ref >60)
Glucose, Bld: 121 mg/dL — ABNORMAL HIGH (ref 70–99)
Potassium: 3.8 mmol/L (ref 3.5–5.1)
Sodium: 144 mmol/L (ref 135–145)
Total Bilirubin: 0.6 mg/dL (ref 0.0–1.2)
Total Protein: 5.3 g/dL — ABNORMAL LOW (ref 6.5–8.1)

## 2024-05-20 LAB — PHOSPHORUS: Phosphorus: 3.9 mg/dL (ref 2.5–4.6)

## 2024-05-20 LAB — MAGNESIUM: Magnesium: 2 mg/dL (ref 1.7–2.4)

## 2024-05-20 NOTE — NC FL2 (Cosign Needed Addendum)
 [place]  MEDICAID FL2 LEVEL OF CARE FORM     IDENTIFICATION  Patient Name: Kim Graham Marciano Birthdate: [birth year] Sex: female Admission Date (Current Location): 05/17/2024  [facility] and [place] Number:  Chiropodist and Address:  [facility], [address], [place], [place] [address]      Provider Number: [id number]  Attending Physician Name and Address:  Leesa Kast, DO  Relative Name and Phone Number:  Patti [place] [phone]    Current Level of Care: Hospital Recommended Level of Care: Skilled Nursing Facility Prior Approval Number:    Date Approved/Denied:   PASRR Number: [id number]  Discharge Plan: SNF    Current Diagnoses: Patient Active Problem List   Diagnosis Date Noted   Rhabdomyolysis, traumatic 05/17/2024   Hypokalemia 05/17/2024   Cellulitis of right lower extremity 05/17/2024   Fall at home, initial encounter 05/17/2024   Hypomagnesemia 05/17/2024   Elevated troponin 05/17/2024   History of fracture of humerus 04/20/24 05/17/2024   Closed displaced spiral fracture of shaft of left humerus 05/17/2024   Chronic venous insufficiency 06/13/2023   Thrombocytopenia (HCC) 05/21/2022   Chronic kidney disease, stage 3a (HCC) 05/20/2022   Nicotine dependence 05/19/2022   Ureteral calculus, left    Hydronephrosis of left kidney    Transaminitis 02/07/2022   IFG (impaired fasting glucose) 10/14/2021   Methadone  dependence (HCC) 04/04/2021   Hep C w/o coma, chronic (HCC) 07/11/2020   Diabetes mellitus type 2, diet-controlled (HCC) 04/15/2018   Depression, recurrent (HCC) 03/18/2018   Advance directive discussed with patient 06/18/2017   Chronic obstructive pulmonary disease (HCC) 06/16/2017    Orientation RESPIRATION BLADDER Height & Weight     Wix, Time, Situation, Place  Normal Incontinent Weight:   Height:  5' 4 (162.6 cm)  BEHAVIORAL SYMPTOMS/MOOD NEUROLOGICAL BOWEL NUTRITION STATUS      Incontinent Diet (Heart  Healthy)  AMBULATORY STATUS COMMUNICATION OF NEEDS Skin   Extensive Assist Verbally Bruising, Skin abrasions                       Personal Care Assistance Level of Assistance  Bathing, Feeding, Dressing Bathing Assistance: Maximum assistance Feeding assistance: Limited assistance Dressing Assistance: Maximum assistance     Functional Limitations Info  Sight, Hearing, Speech          SPECIAL CARE FACTORS FREQUENCY  OT (By licensed OT), PT (By licensed PT)     PT Frequency: 5x OT Frequency: 5x            Contractures Contractures Info: Not present    Additional Factors Info  Code Status, Allergies Code Status Info: FULL Allergies Info: Doxycycline            Current Medications (05/20/2024):  This is the current hospital active medication list Current Facility-Administered Medications  Medication Dose Route Frequency Provider Last Rate Last Admin   acetaminophen  (TYLENOL ) tablet 650 mg  650 mg Oral Q6H PRN Duncan, Hazel V, MD   650 mg at 05/20/24 9350   Or   acetaminophen  (TYLENOL ) suppository 650 mg  650 mg Rectal Q6H PRN Duncan, Hazel V, MD       albuterol  (PROVENTIL ) (2.5 MG/3ML) 0.083% nebulizer solution 2.5 mg  2.5 mg Nebulization Q6H PRN Duncan, Hazel V, MD       ARIPiprazole  (ABILIFY ) tablet 30 mg  30 mg Oral Daily Dezii, Alexandra, DO   30 mg at 05/20/24 1011   budesonide -glycopyrrolate -formoterol  (BREZTRI ) 160-9-4.8 MCG/ACT inhaler 2 puff  2 puff Inhalation  BID Cleatus Delayne GAILS, MD   2 puff at 05/20/24 0830   cefTRIAXone  (ROCEPHIN ) 1 g in sodium chloride  0.9 % 100 mL IVPB  1 g Intravenous Q24H Duncan, Hazel V, MD 200 mL/hr at 05/20/24 0626 1 g at 05/20/24 0626   enoxaparin  (LOVENOX ) injection 40 mg  40 mg Subcutaneous Q24H Duncan, Hazel V, MD   40 mg at 05/20/24 1011   methadone  (DOLOPHINE ) 10 MG/ML solution 100 mg  100 mg Oral Daily Dezii, Alexandra, DO   100 mg at 05/20/24 1012   morphine  (PF) 2 MG/ML injection 2 mg  2 mg Intravenous Q2H PRN Duncan,  Hazel V, MD   2 mg at 05/18/24 1711   ondansetron  (ZOFRAN ) tablet 4 mg  4 mg Oral Q6H PRN Cleatus Delayne GAILS, MD       Or   ondansetron  (ZOFRAN ) injection 4 mg  4 mg Intravenous Q6H PRN Duncan, Hazel V, MD       oxyCODONE  (Oxy IR/ROXICODONE ) immediate release tablet 5 mg  5 mg Oral Q4H PRN Duncan, Hazel V, MD   5 mg at 05/20/24 9350     Discharge Medications: Please see discharge summary for a list of discharge medications.  Relevant Imaging Results:  Relevant Lab Results:   Additional Information [id number]  [place] L Deleah Tison, LCSW

## 2024-05-20 NOTE — Progress Notes (Signed)
 PROGRESS NOTE    Kim Graham  FMW:[id number] DOB: Mar 12, 1951 DOA: 05/17/2024 PCP: Vicci Duwaine SQUIBB, DO  Chief Complaint  Patient presents with   [facility] Course:  Kim Graham is a 73 year old female with chronic pain, COPD, type 2 diabetes, history of HCV status posttreatment, who suffered a fall on 6/12 and subsequently had humeral neck fracture.  At that time she was recommended to follow-up with Ortho trauma but has been unable to do so due to difficulty with transportation and ambulation at home.  On this admission she sustained another fall and laid on the floor for prolonged period of time.  When she presented she was found to have acute rhabdomyolysis with CK 7623, potassium 2.5, mag 1.5, and elevated LFTs.  She refused head CT.  She was also found to have right lower leg cellulitis, and acute swelling of her left arm.  Orthopedic surgery was consulted and again recommended outpatient follow-up with trauma surgery but has placed a new brace on her arm during this admission.  Subjective: No acute events overnight.  Patient reports she is doing better today.  Objective: Vitals:   05/20/24 0257 05/20/24 0405 05/20/24 0854 05/20/24 1259  BP:  (!) 102/51 (!) 99/47 (!) 100/48  Pulse:  66 68 80  Resp:  18 16 18   Temp:  98 F (36.7 C) 98.1 F (36.7 C) 98.2 F (36.8 C)  TempSrc:  Oral    SpO2:  98% 99% 94%  Height: 5' 4 (1.626 m)       Intake/Output Summary (Last 24 hours) at 05/20/2024 1448 Last data filed at 05/20/2024 1020 Gross per 24 hour  Intake 60 ml  Output --  Net 60 ml   There were no vitals filed for this visit.  Examination: General exam: Appears calm and comfortable, NAD  Respiratory system: No work of breathing, symmetric chest wall expansion Cardiovascular system: S1 & S2 heard, RRR.  Gastrointestinal system: Abdomen is nondistended, soft and nontender.  Neuro: Alert and oriented x4. No focal neurological deficits. Extremities: left arm in sling,  right leg with abrasion and surrounding erythema  Psychiatry: Demonstrates appropriate judgement and insight. Mood & affect appropriate for situation.   Assessment & Plan:  Principal Problem:   Rhabdomyolysis, traumatic Active Problems:   Fall at home, initial encounter   History of fracture of humerus 04/20/24   Transaminitis   Hypokalemia   Hypomagnesemia   Elevated troponin   Cellulitis of right lower extremity   Chronic obstructive pulmonary disease (HCC)   Methadone  dependence (HCC)   Diabetes mellitus type 2, diet-controlled (HCC)   Closed displaced spiral fracture of shaft of left humerus   Rhabdomyolysis - Secondary to prolonged downtime - Status post IV fluids - CK resolving.  Will discontinue trend - Continue to encourage oral rehydration. - Doing well with physical therapy occupational therapy  Left Humeral shaft fracture - Fracture sustained 6/12.  Was meant to follow-up with Ortho trauma and has not been able to do so - Plain films this admission reveal: Subacute spiral fracture of left humeral shaft and nondisplaced fracture of left proximal humerus.  Rotation is different from prior films but no significant change in displacement. - Orthopedic surgery consulted, has recommended outpatient follow-up - Has placed new splint - Difficulty maintaining her health needs at home.  Would benefit from SNF placement.  TOC consulted and aware  Right leg cellulitis - Localized swelling and erythema to right leg.  Doppler negative for DVT -  Has open skin abrasion and surrounding swelling.  This is improving. - Continue ceftriaxone , transition to Keflex  at discharge.  Complete 7 days total therapy  Frequent falls Generalized deconditioning - Patient has had difficulty managing her health needs at home for some time, further complicated by left arm fracture - Continue with PT/OT.  Patient would benefit from rehab/SNF placement - TOC consulted to assist in this.  COPD - Not  currently in exacerbation - Continue current medications  Type 2 diabetes - Recent hemoglobin A1c 6.0% - Blood glucose on daily labs has been at goal.  No indication for sliding scale insulin  at this time  Chronic pain Methadone  dependence - Has been verified.  Continue at home dose  History of HCV s/p treatment Transaminitis - Acute elevation in LFTs may be secondary to rhabdo - LFTs resolving now - Continue to trend CMP - Ammonia within normal limits.  Hypokalemia Hypomagnesemia - Continue to review daily labs.  Replace as needed.  Hypotension - Patient appears to be at baseline.  Keep MAP above 65.  Opioids likely contributing.  Fall precautions.  Depression - Continue home meds  DVT prophylaxis: Lovenox    Code Status: Full Code Disposition:  Medically ready now, pending SNF placement  Consultants:  Treatment Team:  Consulting Physician: Lorelle Hussar, MD  Procedures:  N/a  Antimicrobials:  Anti-infectives (From admission, onward)    Start     Dose/Rate Route Frequency Ordered Stop   05/18/24 0600  cefTRIAXone  (ROCEPHIN ) 1 g in sodium chloride  0.9 % 100 mL IVPB        1 g 200 mL/hr over 30 Minutes Intravenous Every 24 hours 05/17/24 0346     05/17/24 0300  cefTRIAXone  (ROCEPHIN ) 2 g in sodium chloride  0.9 % 100 mL IVPB        2 g 200 mL/hr over 30 Minutes Intravenous  Once 05/17/24 0248 05/17/24 0519       Data Reviewed: I have personally reviewed following labs and imaging studies CBC: Recent Labs  Lab 05/17/24 0059 05/18/24 0609 05/19/24 0559 05/20/24 0520  WBC 6.6 7.3 5.8 3.8*  NEUTROABS 4.2 4.7 3.6 2.3  HGB 12.8 11.6* 10.6* 10.6*  HCT 40.7 37.9 34.3* 35.3*  MCV 94.0 94.3 93.2 95.7  PLT 158 235 205 194   Basic Metabolic Panel: Recent Labs  Lab 05/17/24 0059 05/17/24 0353 05/18/24 0609 05/19/24 0559 05/20/24 0520  NA 144 142 144 142 144  K 2.5* 3.0* 3.7 3.3* 3.8  CL 112* 106 109 106 110  CO2 24 27 24 29 29   GLUCOSE 89 89 94 96  121*  BUN 14 15 13 10 12   CREATININE 0.71 0.79 0.80 0.59 0.68  CALCIUM 7.1* 8.2* 8.4* 8.4* 8.6*  MG 1.5* 1.8 2.1 1.9 2.0  PHOS  --   --  2.7 2.9 3.9   GFR: CrCl cannot be calculated (Unknown ideal weight.). Liver Function Tests: Recent Labs  Lab 05/17/24 0059 05/17/24 0353 05/18/24 0609 05/19/24 0559 05/20/24 0520  AST 219* 236* 135* 93* 75*  ALT 64* 73* 61* 54* 55*  ALKPHOS 60 59 61 50 51  BILITOT 1.0 0.8 0.8 0.6 0.6  PROT 4.7* 5.1* 5.3* 5.4* 5.3*  ALBUMIN 2.4* 2.5* 2.5* 2.3* 2.4*   CBG: No results for input(s): GLUCAP in the last 168 hours.  No results found for this or any previous visit (from the past 240 hours).   Radiology Studies: No results found.   Scheduled Meds:  ARIPiprazole   30 mg Oral Daily  budesonide -glycopyrrolate -formoterol   2 puff Inhalation BID   enoxaparin  (LOVENOX ) injection  40 mg Subcutaneous Q24H   methadone   100 mg Oral Daily   Continuous Infusions:  cefTRIAXone  (ROCEPHIN )  IV 1 g (05/20/24 0626)     LOS: 3 days  MDM: Patient is high risk for one or more organ failure.  They necessitate ongoing hospitalization for continued IV therapies and subsequent lab monitoring. Total time spent interpreting labs and vitals, reviewing the medical record, coordinating care amongst consultants and care team members, directly assessing and discussing care with the patient and/or family: 55 min Chau Savell, DO [facility]  To contact the attending physician between 7A-7P please use Epic Chat. To contact the covering physician during after hours 7P-7A, please review Amion.  05/20/2024, 2:48 PM   *This document has been created with the assistance of dictation software. Please excuse typographical errors. *

## 2024-05-20 NOTE — TOC Initial Note (Signed)
 Transition of Care [facility]) - Initial/Assessment Note    Patient Details  Name: Kim Graham MRN: [id number] Date of Birth: April 19, 1951  [facility]) CM/SW Contact:    Kim L Latria Mccarron, LCSW Phone Number: 05/20/2024, 3:54 PM  Clinical Narrative:                  Brief assessment completed. SNF choice discussed. Patient provided choices. FL2 will be completed today.   Patient lives alone. Per SDOH patient lacks transportation. Resources uploaded to the AVS. Patient discussed home health options after she is discharged from the SNF. CSW advised that the facility SW will make referrals for home health when a discharge plan is developed. Patient was agreeable to this.   Patient denied financial concerns. She reports not having any problems filling prescribed medications. Patient denied having DME at home.        Patient Goals and CMS Choice            Expected Discharge Plan and Services                                              Prior Living Arrangements/Services                       Activities of Daily Living   ADL Screening (condition at time of admission) Independently performs ADLs?: No Does the patient have a NEW difficulty with bathing/dressing/toileting/Fidler-feeding that is expected to last >3 days?: Yes (Initiates electronic notice to provider for possible OT consult) Does the patient have a NEW difficulty with getting in/out of bed, walking, or climbing stairs that is expected to last >3 days?: Yes (Initiates electronic notice to provider for possible PT consult) Does the patient have a NEW difficulty with communication that is expected to last >3 days?: No Is the patient deaf or have difficulty hearing?: No Does the patient have difficulty seeing, even when wearing glasses/contacts?: No Does the patient have difficulty concentrating, remembering, or making decisions?: No  Permission Sought/Granted                  Emotional  Assessment              Admission diagnosis:  Rhabdomyolysis [M62.82] Hypokalemia [E87.6] Fall, initial encounter [W19.XXXA] Cellulitis, unspecified cellulitis site [L03.90] Patient Active Problem List   Diagnosis Date Noted   Rhabdomyolysis, traumatic 05/17/2024   Hypokalemia 05/17/2024   Cellulitis of right lower extremity 05/17/2024   Fall at home, initial encounter 05/17/2024   Hypomagnesemia 05/17/2024   Elevated troponin 05/17/2024   History of fracture of humerus 04/20/24 05/17/2024   Closed displaced spiral fracture of shaft of left humerus 05/17/2024   Chronic venous insufficiency 06/13/2023   Thrombocytopenia (HCC) 05/21/2022   Chronic kidney disease, stage 3a (HCC) 05/20/2022   Nicotine dependence 05/19/2022   Ureteral calculus, left    Hydronephrosis of left kidney    Transaminitis 02/07/2022   IFG (impaired fasting glucose) 10/14/2021   Methadone  dependence (HCC) 04/04/2021   Hep C w/o coma, chronic (HCC) 07/11/2020   Diabetes mellitus type 2, diet-controlled (HCC) 04/15/2018   Depression, recurrent (HCC) 03/18/2018   Advance directive discussed with patient 06/18/2017   Chronic obstructive pulmonary disease (HCC) 06/16/2017   PCP:  Vicci Duwaine SQUIBB, DO Pharmacy:   [facility] - [place], [place] - [phone] [place] [address] Phone: [phone] Fax: [phone]     Social Drivers of Health (SDOH) Social History: SDOH Screenings   Food Insecurity: No Food Insecurity (05/02/2024)   Received from [facility]  Housing: Low Risk  (05/02/2024)   Received from [facility]  Transportation Needs: Unmet Transportation Needs (05/02/2024)   Received from [facility]  Utilities: Not At Risk (05/02/2024)   Received [facility]  Alcohol Screen: Low Risk  (08/05/2021)  Depression (PHQ2-9): Medium Risk (05/10/2024)  Financial Resource Strain: Low Risk  (05/02/2024)    Received from [facility]  Physical Activity: Insufficiently Active (08/05/2021)  Social Connections: Unknown (05/18/2024)  Stress: No Stress Concern Present (08/05/2021)  Tobacco Use: Medium Risk (05/17/2024)   SDOH Interventions:     Readmission Risk Interventions    05/20/2024    3:52 PM  Readmission Risk Prevention Plan  Transportation Screening Complete  PCP or Specialist Appt within 3-5 Days Complete  HRI or Home Care Consult Complete  Social Work Consult for Recovery Care Planning/Counseling Complete  Palliative Care Screening Not Applicable  Medication Review Oceanographer) Complete

## 2024-05-20 NOTE — [place] FL2 (Signed)
 [place]  MEDICAID FL2 LEVEL OF CARE FORM     IDENTIFICATION  Patient Name: Kim Graham Birthdate: [birth year] Sex: female Admission Date (Current Location): 05/17/2024  [facility] and [place] Number:  Chiropodist and Address:  [facility], [address], [place], [place] [address]      Provider Number: [id number]  Attending Physician Name and Address:  Leesa Kast, DO  Relative Name and Phone Number:  Patti [place] [phone]    Current Level of Care: Hospital Recommended Level of Care: Skilled Nursing Facility Prior Approval Number:    Date Approved/Denied:   PASRR Number: [id number]  Discharge Plan: SNF    Current Diagnoses: Patient Active Problem List   Diagnosis Date Noted   Rhabdomyolysis, traumatic 05/17/2024   Hypokalemia 05/17/2024   Cellulitis of right lower extremity 05/17/2024   Fall at home, initial encounter 05/17/2024   Hypomagnesemia 05/17/2024   Elevated troponin 05/17/2024   History of fracture of humerus 04/20/24 05/17/2024   Closed displaced spiral fracture of shaft of left humerus 05/17/2024   Chronic venous insufficiency 06/13/2023   Thrombocytopenia (HCC) 05/21/2022   Chronic kidney disease, stage 3a (HCC) 05/20/2022   Nicotine dependence 05/19/2022   Ureteral calculus, left    Hydronephrosis of left kidney    Transaminitis 02/07/2022   IFG (impaired fasting glucose) 10/14/2021   Methadone  dependence (HCC) 04/04/2021   Hep C w/o coma, chronic (HCC) 07/11/2020   Diabetes mellitus type 2, diet-controlled (HCC) 04/15/2018   Depression, recurrent (HCC) 03/18/2018   Advance directive discussed with patient 06/18/2017   Chronic obstructive pulmonary disease (HCC) 06/16/2017    Orientation RESPIRATION BLADDER Height & Weight     Wix, Time, Situation, Place  Normal Incontinent Weight:   Height:  5' 4 (162.6 cm)  BEHAVIORAL SYMPTOMS/MOOD NEUROLOGICAL BOWEL NUTRITION STATUS      Incontinent Diet (Heart  Healthy)  AMBULATORY STATUS COMMUNICATION OF NEEDS Skin   Extensive Assist Verbally Bruising, Skin abrasions                       Personal Care Assistance Level of Assistance  Bathing, Feeding, Dressing Bathing Assistance: Maximum assistance Feeding assistance: Limited assistance Dressing Assistance: Maximum assistance     Functional Limitations Info  Sight, Hearing, Speech          SPECIAL CARE FACTORS FREQUENCY  OT (By licensed OT), PT (By licensed PT)     PT Frequency: 5x OT Frequency: 5x            Contractures Contractures Info: Not present    Additional Factors Info  Code Status, Allergies Code Status Info: FULL Allergies Info: Doxycycline            Current Medications (05/20/2024):  This is the current hospital active medication list Current Facility-Administered Medications  Medication Dose Route Frequency Provider Last Rate Last Admin   acetaminophen  (TYLENOL ) tablet 650 mg  650 mg Oral Q6H PRN Duncan, Hazel V, MD   650 mg at 05/20/24 9350   Or   acetaminophen  (TYLENOL ) suppository 650 mg  650 mg Rectal Q6H PRN Duncan, Hazel V, MD       albuterol  (PROVENTIL ) (2.5 MG/3ML) 0.083% nebulizer solution 2.5 mg  2.5 mg Nebulization Q6H PRN Duncan, Hazel V, MD       ARIPiprazole  (ABILIFY ) tablet 30 mg  30 mg Oral Daily Dezii, Alexandra, DO   30 mg at 05/20/24 1011   budesonide -glycopyrrolate -formoterol  (BREZTRI ) 160-9-4.8 MCG/ACT inhaler 2 puff  2 puff Inhalation  BID Cleatus Delayne GAILS, MD   2 puff at 05/20/24 0830   cefTRIAXone  (ROCEPHIN ) 1 g in sodium chloride  0.9 % 100 mL IVPB  1 g Intravenous Q24H Duncan, Hazel V, MD 200 mL/hr at 05/20/24 0626 1 g at 05/20/24 0626   enoxaparin  (LOVENOX ) injection 40 mg  40 mg Subcutaneous Q24H Duncan, Hazel V, MD   40 mg at 05/20/24 1011   methadone  (DOLOPHINE ) 10 MG/ML solution 100 mg  100 mg Oral Daily Dezii, Alexandra, DO   100 mg at 05/20/24 1012   morphine  (PF) 2 MG/ML injection 2 mg  2 mg Intravenous Q2H PRN Duncan,  Hazel V, MD   2 mg at 05/18/24 1711   ondansetron  (ZOFRAN ) tablet 4 mg  4 mg Oral Q6H PRN Cleatus Delayne GAILS, MD       Or   ondansetron  (ZOFRAN ) injection 4 mg  4 mg Intravenous Q6H PRN Duncan, Hazel V, MD       oxyCODONE  (Oxy IR/ROXICODONE ) immediate release tablet 5 mg  5 mg Oral Q4H PRN Duncan, Hazel V, MD   5 mg at 05/20/24 9350     Discharge Medications: Please see discharge summary for a list of discharge medications.  Relevant Imaging Results:  Relevant Lab Results:   Additional Information [id number]  [place] L Deleah Tison, LCSW

## 2024-05-20 NOTE — Plan of Care (Signed)
  Problem: Education: Goal: Knowledge of General Education information will improve Description: Including pain rating scale, medication(s)/side effects and non-pharmacologic comfort measures Outcome: Progressing   Problem: Nutrition: Goal: Adequate nutrition will be maintained Outcome: Progressing   Problem: Elimination: Goal: Will not experience complications related to bowel motility Outcome: Progressing Goal: Will not experience complications related to urinary retention Outcome: Progressing   Problem: Health Behavior/Discharge Planning: Goal: Ability to manage health-related needs will improve Outcome: Not Progressing   Problem: Clinical Measurements: Goal: Respiratory complications will improve Outcome: Not Progressing   Problem: Pain Managment: Goal: General experience of comfort will improve and/or be controlled Outcome: Not Progressing   Problem: Skin Integrity: Goal: Risk for impaired skin integrity will decrease Outcome: Not Progressing

## 2024-05-21 DIAGNOSIS — M6282 Rhabdomyolysis: Secondary | ICD-10-CM

## 2024-05-21 DIAGNOSIS — E119 Type 2 diabetes mellitus without complications: Secondary | ICD-10-CM | POA: Diagnosis not present

## 2024-05-21 DIAGNOSIS — R7989 Other specified abnormal findings of blood chemistry: Secondary | ICD-10-CM | POA: Diagnosis not present

## 2024-05-21 DIAGNOSIS — S42342S Displaced spiral fracture of shaft of humerus, left arm, sequela: Secondary | ICD-10-CM | POA: Diagnosis not present

## 2024-05-21 DIAGNOSIS — Y92009 Unspecified place in unspecified non-institutional (private) residence as the place of occurrence of the external cause: Secondary | ICD-10-CM

## 2024-05-21 DIAGNOSIS — W19XXXA Unspecified fall, initial encounter: Secondary | ICD-10-CM

## 2024-05-21 LAB — CBC WITH DIFFERENTIAL/PLATELET
Abs Immature Granulocytes: 0.02 K/uL (ref 0.00–0.07)
Basophils Absolute: 0 K/uL (ref 0.0–0.1)
Basophils Relative: 1 %
Eosinophils Absolute: 0.1 K/uL (ref 0.0–0.5)
Eosinophils Relative: 3 %
HCT: 33.1 % — ABNORMAL LOW (ref 36.0–46.0)
Hemoglobin: 10.3 g/dL — ABNORMAL LOW (ref 12.0–15.0)
Immature Granulocytes: 1 %
Lymphocytes Relative: 29 %
Lymphs Abs: 1.2 K/uL (ref 0.7–4.0)
MCH: 29.6 pg (ref 26.0–34.0)
MCHC: 31.1 g/dL (ref 30.0–36.0)
MCV: 95.1 fL (ref 80.0–100.0)
Monocytes Absolute: 0.6 K/uL (ref 0.1–1.0)
Monocytes Relative: 13 %
Neutro Abs: 2.3 K/uL (ref 1.7–7.7)
Neutrophils Relative %: 53 %
Platelets: 187 K/uL (ref 150–400)
RBC: 3.48 MIL/uL — ABNORMAL LOW (ref 3.87–5.11)
RDW: 16.2 % — ABNORMAL HIGH (ref 11.5–15.5)
WBC: 4.2 K/uL (ref 4.0–10.5)
nRBC: 0 % (ref 0.0–0.2)

## 2024-05-21 LAB — COMPREHENSIVE METABOLIC PANEL WITH GFR
ALT: 46 U/L — ABNORMAL HIGH (ref 0–44)
AST: 52 U/L — ABNORMAL HIGH (ref 15–41)
Albumin: 2.2 g/dL — ABNORMAL LOW (ref 3.5–5.0)
Alkaline Phosphatase: 48 U/L (ref 38–126)
Anion gap: 6 (ref 5–15)
BUN: 11 mg/dL (ref 8–23)
CO2: 29 mmol/L (ref 22–32)
Calcium: 8.6 mg/dL — ABNORMAL LOW (ref 8.9–10.3)
Chloride: 108 mmol/L (ref 98–111)
Creatinine, Ser: 0.66 mg/dL (ref 0.44–1.00)
GFR, Estimated: >60 mL/min (ref >60)
Glucose, Bld: 111 mg/dL — ABNORMAL HIGH (ref 70–99)
Potassium: 3.6 mmol/L (ref 3.5–5.1)
Sodium: 143 mmol/L (ref 135–145)
Total Bilirubin: 0.6 mg/dL (ref 0.0–1.2)
Total Protein: 5.1 g/dL — ABNORMAL LOW (ref 6.5–8.1)

## 2024-05-21 LAB — PHOSPHORUS: Phosphorus: 3.9 mg/dL (ref 2.5–4.6)

## 2024-05-21 LAB — MAGNESIUM: Magnesium: 1.8 mg/dL (ref 1.7–2.4)

## 2024-05-21 NOTE — Plan of Care (Signed)
   Problem: Education: Goal: Knowledge of General Education information will improve Description: Including pain rating scale, medication(s)/side effects and non-pharmacologic comfort measures Outcome: Progressing   Problem: Clinical Measurements: Goal: Ability to maintain clinical measurements within normal limits will improve Outcome: Progressing   Problem: Clinical Measurements: Goal: Respiratory complications will improve Outcome: Progressing

## 2024-05-21 NOTE — Progress Notes (Signed)
 PROGRESS NOTE    Kim Graham  FMW:[id number] DOB: 04-Apr-1951 DOA: 05/17/2024 PCP: Vicci Duwaine SQUIBB, DO  Chief Complaint  Patient presents with   [facility] Course:  Kim Graham is a 73 year old female with chronic pain, COPD, type 2 diabetes, history of HCV status posttreatment, who suffered a fall on 6/12 and subsequently had humeral neck fracture.  At that time she was recommended to follow-up with Ortho trauma but has been unable to do so due to difficulty with transportation and ambulation at home.  On this admission she sustained another fall and laid on the floor for prolonged period of time.  When she presented she was found to have acute rhabdomyolysis with CK 7623, potassium 2.5, mag 1.5, and elevated LFTs.  She refused head CT.  She was also found to have right lower leg cellulitis, and acute swelling of her left arm.  Orthopedic surgery was consulted and again recommended outpatient follow-up with trauma surgery but has placed a new brace on her arm during this admission.  Subjective: Some delay in receiving methadone  from pharmacy this morning.  This has the patient upset.  She is tearful on my evaluation.  No acute events overnight.  Objective: Vitals:   05/21/24 0039 05/21/24 0453 05/21/24 0835 05/21/24 1148  BP: (!) 113/51 (!) 111/55 (!) 118/55 (!) 120/50  Pulse: 79 75 86 82  Resp: 18 17  15   Temp: 97.9 F (36.6 C) 99 F (37.2 C) 98.4 F (36.9 C) 98.5 F (36.9 C)  TempSrc: Oral Oral Oral   SpO2: 95% 97% 93% (!) 86%  Weight:      Height:        Intake/Output Summary (Last 24 hours) at 05/21/2024 1429 Last data filed at 05/21/2024 0900 Gross per 24 hour  Intake 440 ml  Output --  Net 440 ml   Filed Weights   05/20/24 2242  Weight: 54.9 kg    Examination: General exam: Appears calm and comfortable, NAD  Respiratory system: No work of breathing, symmetric chest wall expansion Cardiovascular system: S1 & S2 heard, RRR.  Gastrointestinal system:  Abdomen is nondistended, soft and nontender.  Neuro: Alert and oriented x4. No focal neurological deficits. Extremities: left arm in sling, right leg with abrasion and surrounding erythema  Psychiatry: Demonstrates appropriate judgement and insight. Mood & affect appropriate for situation.   Assessment & Plan:  Principal Problem:   Rhabdomyolysis, traumatic Active Problems:   Fall at home, initial encounter   History of fracture of humerus 04/20/24   Transaminitis   Hypokalemia   Hypomagnesemia   Elevated troponin   Cellulitis of right lower extremity   Chronic obstructive pulmonary disease (HCC)   Methadone  dependence (HCC)   Diabetes mellitus type 2, diet-controlled (HCC)   Closed displaced spiral fracture of shaft of left humerus   Rhabdomyolysis - Secondary to prolonged downtime - Status post IV fluids - CK resolving.  Will discontinue trend - Continue to encourage oral rehydration. - Doing well with physical therapy occupational therapy  Left Humeral shaft fracture - Fracture sustained 6/12.  Was meant to follow-up with Ortho trauma and has not been able to do so - Plain films this admission reveal: Subacute spiral fracture of left humeral shaft and nondisplaced fracture of left proximal humerus.  Rotation is different from prior films but no significant change in displacement. - Orthopedic surgery consulted, has recommended outpatient follow-up - Has placed new splint - Difficulty maintaining her health needs at home.  Would benefit from SNF placement.  TOC consulted and aware  Right leg cellulitis - Localized swelling and erythema to right leg.  Doppler negative for DVT - Has open skin abrasion and surrounding swelling.  This is improving. - Continue ceftriaxone , transition to Keflex  at discharge.  Complete 7 days total therapy  Frequent falls Generalized deconditioning - Patient has had difficulty managing her health needs at home for some time, further complicated  by left arm fracture - Continue with PT/OT.  Patient would benefit from rehab/SNF placement - TOC consulted to assist in this.  COPD - Not currently in exacerbation - Continue current medications  Type 2 diabetes - Recent hemoglobin A1c 6.0% - Blood glucose on daily labs has been at goal.  No indication for sliding scale insulin  at this time  Chronic pain Methadone  dependence - Has been verified.  Continue at home dose - Discontinue morphine . - As needed oxycodone  for breakthrough pain, will continue to de-escalate this as able.  Currently just receiving once daily  History of HCV s/p treatment Transaminitis - Acute elevation in LFTs may be secondary to rhabdo - LFTs resolving now. - Ammonia within normal limits.  Hypokalemia Hypomagnesemia - Daily CMP.  Replace as needed.  Currently WNL  Hypotension - Patient appears to be at baseline.  Keep MAP above 65.  Opioids likely contributing.  Fall precautions.  Depression - Continue home meds  DVT prophylaxis: Lovenox    Code Status: Full Code Disposition:  Medically ready now, pending SNF placement  Consultants:  Treatment Team:  Consulting Physician: Lorelle Hussar, MD  Procedures:  N/a  Antimicrobials:  Anti-infectives (From admission, onward)    Start     Dose/Rate Route Frequency Ordered Stop   05/18/24 0600  cefTRIAXone  (ROCEPHIN ) 1 g in sodium chloride  0.9 % 100 mL IVPB        1 g 200 mL/hr over 30 Minutes Intravenous Every 24 hours 05/17/24 0346     05/17/24 0300  cefTRIAXone  (ROCEPHIN ) 2 g in sodium chloride  0.9 % 100 mL IVPB        2 g 200 mL/hr over 30 Minutes Intravenous  Once 05/17/24 0248 05/17/24 0519       Data Reviewed: I have personally reviewed following labs and imaging studies CBC: Recent Labs  Lab 05/17/24 0059 05/18/24 0609 05/19/24 0559 05/20/24 0520 05/21/24 0429  WBC 6.6 7.3 5.8 3.8* 4.2  NEUTROABS 4.2 4.7 3.6 2.3 2.3  HGB 12.8 11.6* 10.6* 10.6* 10.3*  HCT 40.7 37.9 34.3*  35.3* 33.1*  MCV 94.0 94.3 93.2 95.7 95.1  PLT 158 235 205 194 187   Basic Metabolic Panel: Recent Labs  Lab 05/17/24 0353 05/18/24 0609 05/19/24 0559 05/20/24 0520 05/21/24 0429  NA 142 144 142 144 143  K 3.0* 3.7 3.3* 3.8 3.6  CL 106 109 106 110 108  CO2 27 24 29 29 29   GLUCOSE 89 94 96 121* 111*  BUN 15 13 10 12 11   CREATININE 0.79 0.80 0.59 0.68 0.66  CALCIUM 8.2* 8.4* 8.4* 8.6* 8.6*  MG 1.8 2.1 1.9 2.0 1.8  PHOS  --  2.7 2.9 3.9 3.9   GFR: Estimated Creatinine Clearance: 54.9 mL/min (by C-G formula based on SCr of 0.66 mg/dL). Liver Function Tests: Recent Labs  Lab 05/17/24 0353 05/18/24 0609 05/19/24 0559 05/20/24 0520 05/21/24 0429  AST 236* 135* 93* 75* 52*  ALT 73* 61* 54* 55* 46*  ALKPHOS 59 61 50 51 48  BILITOT 0.8 0.8 0.6 0.6 0.6  PROT  5.1* 5.3* 5.4* 5.3* 5.1*  ALBUMIN 2.5* 2.5* 2.3* 2.4* 2.2*   CBG: No results for input(s): GLUCAP in the last 168 hours.  No results found for this or any previous visit (from the past 240 hours).   Radiology Studies: No results found.   Scheduled Meds:  ARIPiprazole   30 mg Oral Daily   budesonide -glycopyrrolate -formoterol   2 puff Inhalation BID   enoxaparin  (LOVENOX ) injection  40 mg Subcutaneous Q24H   methadone   100 mg Oral Daily   Continuous Infusions:  cefTRIAXone  (ROCEPHIN )  IV 1 g (05/21/24 0522)     LOS: 4 days  MDM: Patient is high risk for one or more organ failure.  They necessitate ongoing hospitalization for continued IV therapies and subsequent lab monitoring. Total time spent interpreting labs and vitals, reviewing the medical record, coordinating care amongst consultants and care team members, directly assessing and discussing care with the patient and/or family: 55 min Cortana Vanderford, DO [facility]  To contact the attending physician between 7A-7P please use Epic Chat. To contact the covering physician during after hours 7P-7A, please review Amion.  05/21/2024, 2:29 PM   *This  document has been created with the assistance of dictation software. Please excuse typographical errors. *

## 2024-05-22 DIAGNOSIS — N1831 Chronic kidney disease, stage 3a: Secondary | ICD-10-CM | POA: Diagnosis not present

## 2024-05-22 DIAGNOSIS — R7989 Other specified abnormal findings of blood chemistry: Secondary | ICD-10-CM | POA: Diagnosis not present

## 2024-05-22 DIAGNOSIS — B182 Chronic viral hepatitis C: Secondary | ICD-10-CM

## 2024-05-22 DIAGNOSIS — I872 Venous insufficiency (chronic) (peripheral): Secondary | ICD-10-CM | POA: Diagnosis not present

## 2024-05-22 DIAGNOSIS — F339 Major depressive disorder, recurrent, unspecified: Secondary | ICD-10-CM

## 2024-05-22 DIAGNOSIS — D696 Thrombocytopenia, unspecified: Secondary | ICD-10-CM | POA: Diagnosis not present

## 2024-05-22 DIAGNOSIS — Z7951 Long term (current) use of inhaled steroids: Secondary | ICD-10-CM | POA: Diagnosis not present

## 2024-05-22 DIAGNOSIS — I129 Hypertensive chronic kidney disease with stage 1 through stage 4 chronic kidney disease, or unspecified chronic kidney disease: Secondary | ICD-10-CM | POA: Diagnosis not present

## 2024-05-22 DIAGNOSIS — W19XXXD Unspecified fall, subsequent encounter: Secondary | ICD-10-CM | POA: Diagnosis not present

## 2024-05-22 DIAGNOSIS — Z87442 Personal history of urinary calculi: Secondary | ICD-10-CM | POA: Diagnosis not present

## 2024-05-22 DIAGNOSIS — S42302K Unspecified fracture of shaft of humerus, left arm, subsequent encounter for fracture with nonunion: Secondary | ICD-10-CM | POA: Diagnosis not present

## 2024-05-22 DIAGNOSIS — R7303 Prediabetes: Secondary | ICD-10-CM | POA: Diagnosis not present

## 2024-05-22 DIAGNOSIS — S42342S Displaced spiral fracture of shaft of humerus, left arm, sequela: Secondary | ICD-10-CM | POA: Diagnosis not present

## 2024-05-22 DIAGNOSIS — J449 Chronic obstructive pulmonary disease, unspecified: Secondary | ICD-10-CM | POA: Diagnosis not present

## 2024-05-22 DIAGNOSIS — M6282 Rhabdomyolysis: Secondary | ICD-10-CM | POA: Diagnosis not present

## 2024-05-22 DIAGNOSIS — E119 Type 2 diabetes mellitus without complications: Secondary | ICD-10-CM | POA: Diagnosis not present

## 2024-05-22 LAB — PHOSPHORUS: Phosphorus: 3.3 mg/dL (ref 2.5–4.6)

## 2024-05-22 LAB — CBC WITH DIFFERENTIAL/PLATELET
Abs Immature Granulocytes: 0.02 K/uL (ref 0.00–0.07)
Basophils Absolute: 0 K/uL (ref 0.0–0.1)
Basophils Relative: 1 %
Eosinophils Absolute: 0.1 K/uL (ref 0.0–0.5)
Eosinophils Relative: 3 %
HCT: 36.6 % (ref 36.0–46.0)
Hemoglobin: 11.4 g/dL — ABNORMAL LOW (ref 12.0–15.0)
Immature Granulocytes: 0 %
Lymphocytes Relative: 27 %
Lymphs Abs: 1.2 K/uL (ref 0.7–4.0)
MCH: 29.2 pg (ref 26.0–34.0)
MCHC: 31.1 g/dL (ref 30.0–36.0)
MCV: 93.8 fL (ref 80.0–100.0)
Monocytes Absolute: 0.6 K/uL (ref 0.1–1.0)
Monocytes Relative: 12 %
Neutro Abs: 2.5 K/uL (ref 1.7–7.7)
Neutrophils Relative %: 57 %
Platelets: 199 K/uL (ref 150–400)
RBC: 3.9 MIL/uL (ref 3.87–5.11)
RDW: 15.9 % — ABNORMAL HIGH (ref 11.5–15.5)
WBC: 4.5 K/uL (ref 4.0–10.5)
nRBC: 0 % (ref 0.0–0.2)

## 2024-05-22 LAB — COMPREHENSIVE METABOLIC PANEL WITH GFR
ALT: 45 U/L — ABNORMAL HIGH (ref 0–44)
AST: 53 U/L — ABNORMAL HIGH (ref 15–41)
Albumin: 2.5 g/dL — ABNORMAL LOW (ref 3.5–5.0)
Alkaline Phosphatase: 55 U/L (ref 38–126)
Anion gap: 5 (ref 5–15)
BUN: 11 mg/dL (ref 8–23)
CO2: 33 mmol/L — ABNORMAL HIGH (ref 22–32)
Calcium: 8.8 mg/dL — ABNORMAL LOW (ref 8.9–10.3)
Chloride: 105 mmol/L (ref 98–111)
Creatinine, Ser: 0.38 mg/dL — ABNORMAL LOW (ref 0.44–1.00)
GFR, Estimated: >60 mL/min (ref >60)
Glucose, Bld: 95 mg/dL (ref 70–99)
Potassium: 3.7 mmol/L (ref 3.5–5.1)
Sodium: 143 mmol/L (ref 135–145)
Total Bilirubin: 0.6 mg/dL (ref 0.0–1.2)
Total Protein: 6.1 g/dL — ABNORMAL LOW (ref 6.5–8.1)

## 2024-05-22 LAB — MAGNESIUM: Magnesium: 2 mg/dL (ref 1.7–2.4)

## 2024-05-22 MED ORDER — SODIUM CHLORIDE 0.9 % IV BOLUS
250.0000 mL | Freq: Once | INTRAVENOUS | Status: AC
  Administered 2024-05-22: 250 mL via INTRAVENOUS

## 2024-05-22 NOTE — Plan of Care (Signed)
  Problem: Health Behavior/Discharge Planning: Goal: Ability to manage health-related needs will improve Outcome: Progressing   Problem: Clinical Measurements: Goal: Ability to maintain clinical measurements within normal limits will improve Outcome: Progressing   Problem: Clinical Measurements: Goal: Will remain free from infection Outcome: Progressing   Problem: Clinical Measurements: Goal: Respiratory complications will improve Outcome: Progressing   

## 2024-05-22 NOTE — Progress Notes (Signed)
 Occupational Therapy Treatment Patient Details Name: Kim Graham MRN: [id number] DOB: 06/29/1951 Today's Date: 05/22/2024   History of present illness Pt 73 y.o. female who presented after a fall on 04/20/2024 to the emergency room was found to have a left humeral shaft fracture.  Patient was placed in a splint and seen outpatient after that.  She had another fall and was down for about 5 hours in the floor. Reports having L hand weakness. PMH of COPD, depression, DM.   OT comments  Pt received leaning heavily on her LUE while in the recliner, endorsing she is unable to [place] correct to midline. Call bell noted to be in reach as well as call button on side of bed rail. Pt unable to say why she did not call for assist. Pt more alert, but continues to demo very poor safety awareness and awareness of deficits as well as impaired problem solving. Pt required MOD A to come to midline. Additional education provided on LUE NWBing and sling positioning for optimal joint protection and comfort. OT adjusted the sling for improved comfort. L hand noted to be slightly less edematous and better AROM of fingers this date without pain. Encouraged pt to maintain fingers in extension. CGA-MIN A to stand and take a few shuffled steps to the bed with R handheld assist. Care taken to support more midline positioning once returned to bed. Care team notified to monitor. Pt continues to benefit from skilled OT services. Current recommendation remains appropriate.       If plan is discharge home, recommend the following:  A little help with walking and/or transfers;A lot of help with bathing/dressing/bathroom;Direct supervision/assist for medications management;Assist for transportation;Assistance with cooking/housework;Help with stairs or ramp for entrance;Direct supervision/assist for financial management;Supervision due to cognitive status   Equipment Recommendations  Other (comment) (defer)       Precautions /  Restrictions Precautions Precautions: Fall;Other (comment) Recall of Precautions/Restrictions: Impaired Precaution/Restrictions Comments: LUE brace, wrist carpal tunnel brace Required Braces or Orthoses: Other Brace Other Brace: Sarmiento brace per Ortho, wrist carpel tunnel brace Restrictions Weight Bearing Restrictions Per Provider Order: Yes LUE Weight Bearing Per Provider Order: Non weight bearing Other Position/Activity Restrictions: no official order in chart, but given fracture assuming NWBing at this time.       Mobility Bed Mobility Overal bed mobility: Needs Assistance Bed Mobility: Sit to Supine       Sit to supine: Min assist   General bed mobility comments: MIN A for BLE mgt    Transfers Overall transfer level: Needs assistance Equipment used: 1 person hand held assist Transfers: Sit to/from Stand Sit to Stand: Contact guard assist, Min assist       Balance Overall balance assessment: Needs assistance Sitting-balance support: Feet supported Sitting balance-Leahy Scale: Poor Sitting balance - Comments: heavy L lateral lean noted while in recliner, able to maintain midline without back support prior to standing   Standing balance support: Single extremity supported, During functional activity Standing balance-Leahy Scale: Poor       ADL either performed or assessed with clinical judgement     Communication Communication Communication: No apparent difficulties   Cognition Arousal: Alert Behavior During Therapy: Flat affect Cognition: No family/caregiver present to determine baseline, Cognition impaired     Awareness: Intellectual awareness intact, Online awareness impaired Memory impairment (select all impairments): Short-term memory, Working [employer] fast streamer, Armed forces training and education officer functioning impairment (select all impairments): Initiation, Reasoning, Problem solving OT - Cognition Comments: Pt more alert this  session, however continues  to demo impaired safety awareness and awareness of deficits, leaning heavily on LUE/L lateral side in recliner   Following commands: Impaired Following commands impaired: Follows one step commands with increased time      Cueing   Cueing Techniques: Verbal cues, Tactile cues  Exercises Other Exercises Other Exercises: MAX A for LUE sling mgt and repositioning, pt further educated in NWBing status and assist provided to minimize L lateral lean once returned to bed            Pertinent Vitals/ Pain       Pain Assessment Faces Pain Scale: Hurts even more Pain Location: LUE and buttocks Pain Descriptors / Indicators: Moaning, Grimacing, Guarding Pain Intervention(s): Limited activity within patient's tolerance, Monitored during session, Repositioned, Premedicated before session   Frequency  Min 2X/week        Progress Toward Goals  OT Goals(current goals can now be found in the care plan section)  Progress towards OT goals: Progressing toward goals  Acute Rehab OT Goals Patient Stated Goal: have less pain OT Goal Formulation: With patient Time For Goal Achievement: 06/01/24 Potential to Achieve Goals: Good         AM-PAC OT 6 Clicks Daily Activity     Outcome Measure   Help from another person eating meals?: A Little Help from another person taking care of personal grooming?: A Little Help from another person toileting, which includes using toliet, bedpan, or urinal?: A Little Help from another person bathing (including washing, rinsing, drying)?: A Lot Help from another person to put on and taking off regular upper body clothing?: A Lot Help from another person to put on and taking off regular lower body clothing?: A Little 6 Click Score: 16    End of Session    OT Visit Diagnosis: Unsteadiness on feet (R26.81);Muscle weakness (generalized) (M62.81);Repeated falls (R29.6);Pain Pain - Right/Left: Left Pain - part of body: Arm   Activity Tolerance Other  (comment);Patient tolerated treatment well (cognition/safety)   Patient Left in bed;with call bell/phone within reach;with bed alarm set           Time: 1207-1217 OT Time Calculation (min): 10 min  Charges: OT General Charges $OT Visit: 1 Visit OT Treatments $[place] Care/Home Management : 8-22 mins  Warren SAUNDERS., MPH, MS, OTR/L ascom [phone] 05/22/24, 1:09 PM

## 2024-05-22 NOTE — Progress Notes (Signed)
 PROGRESS NOTE    Kim Graham  FMW:[id number] DOB: 01-02-1951 DOA: 05/17/2024 PCP: Vicci Duwaine SQUIBB, DO  Chief Complaint  Patient presents with   [facility] Course:  Kim Graham is a 73 year old female with chronic pain, COPD, type 2 diabetes, history of HCV status posttreatment, who suffered a fall on 6/12 and subsequently had humeral neck fracture.  At that time she was recommended to follow-up with Ortho trauma but has been unable to do so due to difficulty with transportation and ambulation at home.  On this admission she sustained another fall and laid on the floor for prolonged period of time.  When she presented she was found to have acute rhabdomyolysis with CK 7623, potassium 2.5, mag 1.5, and elevated LFTs.  She refused head CT.  She was also found to have right lower leg cellulitis, and acute swelling of her left arm.  Orthopedic surgery was consulted and again recommended outpatient follow-up with trauma surgery but has placed a new brace on her arm during this admission.  Subjective: No acute events overnight.  Patient has been restless this morning.  Complaining of generalized pain.  Objective: Vitals:   05/22/24 0425 05/22/24 1254 05/22/24 1303 05/22/24 1526  BP: 120/65 (!) 86/38 (!) 91/47 (!) 101/44  Pulse: 77 77  76  Resp: 20 18    Temp: 97.8 F (36.6 C) 98.1 F (36.7 C)  98.8 F (37.1 C)  TempSrc:    Oral  SpO2: 94% 96%  94%  Weight:      Height:       No intake or output data in the 24 hours ending 05/22/24 1641  Filed Weights   05/20/24 2242  Weight: 54.9 kg    Examination: General exam: Appears calm and comfortable, NAD  Respiratory system: No work of breathing, symmetric chest wall expansion Cardiovascular system: S1 & S2 heard, RRR.  Gastrointestinal system: Abdomen is nondistended, soft and nontender.  Neuro: Alert and oriented x4. No focal neurological deficits. Extremities: left arm in sling, right leg with abrasion and surrounding  erythema  Psychiatry: Demonstrates appropriate judgement and insight. Mood & affect appropriate for situation.   Assessment & Plan:  Principal Problem:   Rhabdomyolysis, traumatic Active Problems:   Fall at home, initial encounter   History of fracture of humerus 04/20/24   Transaminitis   Hypokalemia   Hypomagnesemia   Elevated troponin   Cellulitis of right lower extremity   Chronic obstructive pulmonary disease (HCC)   Methadone  dependence (HCC)   Diabetes mellitus type 2, diet-controlled (HCC)   Closed displaced spiral fracture of shaft of left humerus   Rhabdomyolysis - Secondary to prolonged downtime - Status post IV fluids - CK resolving.  Will discontinue trend - Continue to encourage oral rehydration. - Doing well with physical therapy occupational therapy  Left Humeral shaft fracture - Fracture sustained 6/12.  Was meant to follow-up with Ortho trauma and has not been able to do so - Plain films this admission reveal: Subacute spiral fracture of left humeral shaft and nondisplaced fracture of left proximal humerus.  Rotation is different from prior films but no significant change in displacement. - Orthopedic surgery consulted, has recommended outpatient follow-up - Has placed new splint - Difficulty maintaining her health needs at home.  Would benefit from SNF placement.  TOC consulted and aware  Right leg cellulitis - Localized swelling and erythema to right leg.  Doppler negative for DVT - Has open skin abrasion and surrounding swelling.  This is improving. - Continue ceftriaxone , transition to Keflex  at discharge.  Complete 7 days total therapy  Frequent falls Generalized deconditioning - Patient has had difficulty managing her health needs at home for some time, further complicated by left arm fracture - Continue with PT/OT.  Patient would benefit from rehab/SNF placement - TOC consulted to assist in this.  COPD - Not currently in exacerbation - Continue  current medications  Type 2 diabetes - Recent hemoglobin A1c 6.0% - Blood glucose on daily labs has been at goal.  No indication for sliding scale insulin  at this time  Chronic pain Methadone  dependence - Has been verified.  Continue at home dose - Discontinue morphine . - As needed oxycodone  for breakthrough pain, will continue to de-escalate this as able.  Currently just receiving once daily  History of HCV s/p treatment Transaminitis - Acute elevation in LFTs may be secondary to rhabdo - LFTs resolving now. - Ammonia within normal limits.  Hypokalemia Hypomagnesemia - Daily CMP.  Replace as needed.  Currently WNL  Hypotension - Patient appears to be at baseline.  Keep MAP above 65.  Opioids likely contributing.  Fall precautions. - Fluid bolus today  Depression - Continue home meds  DVT prophylaxis: Lovenox    Code Status: Full Code Disposition:  Medically ready now, pending SNF placement  Consultants:  Treatment Team:  Consulting Physician: Lorelle Hussar, MD  Procedures:  N/a  Antimicrobials:  Anti-infectives (From admission, onward)    Start     Dose/Rate Route Frequency Ordered Stop   05/18/24 0600  cefTRIAXone  (ROCEPHIN ) 1 g in sodium chloride  0.9 % 100 mL IVPB        1 g 200 mL/hr over 30 Minutes Intravenous Every 24 hours 05/17/24 0346     05/17/24 0300  cefTRIAXone  (ROCEPHIN ) 2 g in sodium chloride  0.9 % 100 mL IVPB        2 g 200 mL/hr over 30 Minutes Intravenous  Once 05/17/24 0248 05/17/24 0519       Data Reviewed: I have personally reviewed following labs and imaging studies CBC: Recent Labs  Lab 05/18/24 0609 05/19/24 0559 05/20/24 0520 05/21/24 0429 05/22/24 0556  WBC 7.3 5.8 3.8* 4.2 4.5  NEUTROABS 4.7 3.6 2.3 2.3 2.5  HGB 11.6* 10.6* 10.6* 10.3* 11.4*  HCT 37.9 34.3* 35.3* 33.1* 36.6  MCV 94.3 93.2 95.7 95.1 93.8  PLT 235 205 194 187 199   Basic Metabolic Panel: Recent Labs  Lab 05/18/24 0609 05/19/24 0559 05/20/24 0520  05/21/24 0429 05/22/24 0556  NA 144 142 144 143 143  K 3.7 3.3* 3.8 3.6 3.7  CL 109 106 110 108 105  CO2 24 29 29 29  33*  GLUCOSE 94 96 121* 111* 95  BUN 13 10 12 11 11   CREATININE 0.80 0.59 0.68 0.66 0.38*  CALCIUM 8.4* 8.4* 8.6* 8.6* 8.8*  MG 2.1 1.9 2.0 1.8 2.0  PHOS 2.7 2.9 3.9 3.9 3.3   GFR: Estimated Creatinine Clearance: 54.9 mL/min (A) (by C-G formula based on SCr of 0.38 mg/dL (L)). Liver Function Tests: Recent Labs  Lab 05/18/24 0609 05/19/24 0559 05/20/24 0520 05/21/24 0429 05/22/24 0556  AST 135* 93* 75* 52* 53*  ALT 61* 54* 55* 46* 45*  ALKPHOS 61 50 51 48 55  BILITOT 0.8 0.6 0.6 0.6 0.6  PROT 5.3* 5.4* 5.3* 5.1* 6.1*  ALBUMIN 2.5* 2.3* 2.4* 2.2* 2.5*   CBG: No results for input(s): GLUCAP in the last 168 hours.  No results found for this or  any previous visit (from the past 240 hours).   Radiology Studies: No results found.   Scheduled Meds:  ARIPiprazole   30 mg Oral Daily   budesonide -glycopyrrolate -formoterol   2 puff Inhalation BID   enoxaparin  (LOVENOX ) injection  40 mg Subcutaneous Q24H   methadone   100 mg Oral Daily   Continuous Infusions:  cefTRIAXone  (ROCEPHIN )  IV 1 g (05/22/24 0607)     LOS: 5 days  MDM: Patient is high risk for one or more organ failure.  They necessitate ongoing hospitalization for continued IV therapies and subsequent lab monitoring. Total time spent interpreting labs and vitals, reviewing the medical record, coordinating care amongst consultants and care team members, directly assessing and discussing care with the patient and/or family: 55 min Jamee Pacholski, DO [facility]  To contact the attending physician between 7A-7P please use Epic Chat. To contact the covering physician during after hours 7P-7A, please review Amion.  05/22/2024, 4:41 PM   *This document has been created with the assistance of dictation software. Please excuse typographical errors. *

## 2024-05-22 NOTE — Progress Notes (Signed)
 Physical Therapy Treatment Patient Details Name: Kim Graham MRN: [id number] DOB: 10/22/1951 Today's Date: 05/22/2024   History of Present Illness Pt 73 y.o. female who presented after a fall on 04/20/2024 to the emergency room was found to have a left humeral shaft fracture.  Patient was placed in a splint and seen outpatient after that.  She had another fall and was down for about 5 hours in the floor. Reports having L hand weakness. PMH of COPD, depression, DM.    PT Comments  Patient alert, in bed. Reported ongoing buttocks pain with all mobility. minA needed for OOB, transfers, and ambulation today, handheld assist and constant hands on to ensure safety. Pt with several LOB while ambulating ~2ft, minA to correct. Very shuffled, slow, unsteady gait. Returned to supine with needs in reach. The patient would benefit from further skilled PT intervention to continue to progress towards goals.     If plan is discharge home, recommend the following: A lot of help with walking and/or transfers;A lot of help with bathing/dressing/bathroom;Assistance with feeding;Assist for transportation;Assistance with cooking/housework;Help with stairs or ramp for entrance;Direct supervision/assist for medications management   Can travel by private vehicle     No  Equipment Recommendations  Other (comment) (TBD)    Recommendations for Other Services       Precautions / Restrictions Precautions Precautions: Fall;Other (comment) Recall of Precautions/Restrictions: Impaired Precaution/Restrictions Comments: LUE brace, wrist carpal tunnel brace Required Braces or Orthoses: Other Brace Other Brace: Sarmiento brace per Ortho, wrist carpel tunnel brace Restrictions Weight Bearing Restrictions Per Provider Order: Yes LUE Weight Bearing Per Provider Order: Non weight bearing Other Position/Activity Restrictions: no official order in chart, but given fracture assuming NWBing at this time.     Mobility  Bed  Mobility Overal bed mobility: Needs Assistance Bed Mobility: Sit to Supine, Supine to Sit     Supine to sit: Min assist, HOB elevated, Used rails Sit to supine: Min assist        Transfers Overall transfer level: Needs assistance Equipment used: 1 person hand held assist Transfers: Sit to/from Stand Sit to Stand: Min assist                Ambulation/Gait Ambulation/Gait assistance: Min assist, Contact guard assist Gait Distance (Feet): 40 Feet Assistive device: 1 person hand held assist         General Gait Details: very slow, shuffled steps, intermittent MINA due to LOBs (posteriorly, laterally). needs hands on assistance at all times   Stairs             Wheelchair Mobility     Tilt Bed    Modified Rankin (Stroke Patients Only)       Balance Overall balance assessment: Needs assistance Sitting-balance support: Feet supported Sitting balance-Leahy Scale: Poor     Standing balance support: Single extremity supported, During functional activity Standing balance-Leahy Scale: Poor                              Communication Communication Communication: No apparent difficulties  Cognition Arousal: Alert Behavior During Therapy: Flat affect                             Following commands: Impaired Following commands impaired: Follows one step commands with increased time    Cueing Cueing Techniques: Verbal cues, Tactile cues  Exercises      General Comments  Pertinent Vitals/Pain Pain Assessment Pain Assessment: Faces Faces Pain Scale: Hurts even more Pain Location: buttocks Pain Descriptors / Indicators: Moaning, Grimacing, Guarding Pain Intervention(s): Limited activity within patient's tolerance, Monitored during session, Repositioned    Home Living                          Prior Function            PT Goals (current goals can now be found in the care plan section) Progress towards PT  goals: Progressing toward goals    Frequency    Min 3X/week      PT Plan      Co-evaluation              AM-PAC PT 6 Clicks Mobility   Outcome Measure  Help needed turning from your back to your side while in a flat bed without using bedrails?: A Lot Help needed moving from lying on your back to sitting on the side of a flat bed without using bedrails?: A Lot Help needed moving to and from a bed to a chair (including a wheelchair)?: A Lot Help needed standing up from a chair using your arms (e.g., wheelchair or bedside chair)?: A Lot Help needed to walk in hospital room?: A Lot Help needed climbing 3-5 steps with a railing? : A Lot 6 Click Score: 12    End of Session   Activity Tolerance: Patient limited by fatigue Patient left: with call bell/phone within reach;in bed;with bed alarm set Nurse Communication: Mobility status PT Visit Diagnosis: Other abnormalities of gait and mobility (R26.89);Difficulty in walking, not elsewhere classified (R26.2);Muscle weakness (generalized) (M62.81);Pain Pain - Right/Left: Left Pain - part of body: Shoulder     Time: 8690-8676 PT Time Calculation (min) (ACUTE ONLY): 14 min  Charges:    $Therapeutic Activity: 8-22 mins PT General Charges $$ ACUTE PT VISIT: 1 Visit                     Doyal Shams PT, DPT 3:02 PM,05/22/24

## 2024-05-23 DIAGNOSIS — L03115 Cellulitis of right lower limb: Secondary | ICD-10-CM

## 2024-05-23 DIAGNOSIS — M6282 Rhabdomyolysis: Secondary | ICD-10-CM

## 2024-05-23 DIAGNOSIS — W19XXXA Unspecified fall, initial encounter: Secondary | ICD-10-CM | POA: Diagnosis not present

## 2024-05-23 DIAGNOSIS — E119 Type 2 diabetes mellitus without complications: Secondary | ICD-10-CM

## 2024-05-23 DIAGNOSIS — S42342S Displaced spiral fracture of shaft of humerus, left arm, sequela: Secondary | ICD-10-CM

## 2024-05-23 DIAGNOSIS — E876 Hypokalemia: Secondary | ICD-10-CM

## 2024-05-23 DIAGNOSIS — R7989 Other specified abnormal findings of blood chemistry: Secondary | ICD-10-CM

## 2024-05-23 DIAGNOSIS — Y92009 Unspecified place in unspecified non-institutional (private) residence as the place of occurrence of the external cause: Secondary | ICD-10-CM

## 2024-05-23 MED ORDER — OXYCODONE HCL 5 MG PO TABS
5.0000 mg | ORAL_TABLET | Freq: Three times a day (TID) | ORAL | Status: DC | PRN
  Administered 2024-05-23 – 2024-05-25 (×3): 5 mg via ORAL
  Filled 2024-05-23 (×3): qty 1

## 2024-05-23 MED ORDER — LACTULOSE 10 GM/15ML PO SOLN
20.0000 g | Freq: Three times a day (TID) | ORAL | Status: DC
  Administered 2024-05-23 – 2024-05-25 (×6): 20 g via ORAL
  Filled 2024-05-23 (×6): qty 30

## 2024-05-23 NOTE — TOC Progression Note (Signed)
 Transition of Care [facility]) - Progression Note    Patient Details  Name: Kim Graham MRN: [id number] Date of Birth: Sep 28, 1951  [facility]) CM/SW Contact  [name] JAYSON Childes, RN Phone Number: 05/23/2024, 4:06 PM  Clinical Narrative:    Patient given bed offers for [facility], [facility], [address] [place], [facility], [facility], and [facility]. Patient stated she needed time to provide choice.        Expected Discharge Plan and Services                                               Social Determinants of Health (SDOH) Interventions SDOH Screenings   Food Insecurity: No Food Insecurity (05/02/2024)   Received from [facility]  Housing: Low Risk  (05/02/2024)   Received from [facility]  Transportation Needs: Unmet Transportation Needs (05/02/2024)   Received from [facility]  Utilities: Not At Risk (05/02/2024)   Received from [facility]  Alcohol Screen: Low Risk  (08/05/2021)  Depression (PHQ2-9): Medium Risk (05/10/2024)  Financial Resource Strain: Low Risk  (05/02/2024)   Received from [facility]  Physical Activity: Insufficiently Active (08/05/2021)  Social Connections: Unknown (05/18/2024)  Stress: No Stress Concern Present (08/05/2021)  Tobacco Use: Medium Risk (05/17/2024)    Readmission Risk Interventions    05/20/2024    3:52 PM  Readmission Risk Prevention Plan  Transportation Screening Complete  PCP or Specialist Appt within 3-5 Days Complete  HRI or Home Care Consult Complete  Social Work Consult for Recovery Care Planning/Counseling Complete  Palliative Care Screening Not Applicable  Medication Review [employer]) Complete

## 2024-05-23 NOTE — Plan of Care (Signed)
  Problem: Activity: Goal: Risk for activity intolerance will decrease Outcome: Progressing   Problem: Elimination: Goal: Will not experience complications related to urinary retention Outcome: Progressing   Problem: Pain Managment: Goal: General experience of comfort will improve and/or be controlled Outcome: Progressing   Problem: Skin Integrity: Goal: Skin integrity will improve Outcome: Progressing

## 2024-05-23 NOTE — Plan of Care (Signed)

## 2024-05-23 NOTE — Progress Notes (Signed)
 Occupational Therapy Treatment Patient Details Name: Kim Graham MRN: [id number] DOB: [birth year] Today's Date: 05/23/2024   History of present illness Pt 73 y.o. female who presented after a fall on 04/20/2024 to the emergency room was found to have a left humeral shaft fracture.  Patient was placed in a splint and seen outpatient after that.  She had another fall and was down for about 5 hours in the floor. Reports having L hand weakness. PMH of COPD, depression, DM.   OT comments  Pt found sleeping but easily wakes to OT's voice, able to maintain attention briefly with VCs but then without verbal input, will dose off again and appears to reach for things/pick at her nasal cannula unintentionally. Sling found to be malpositioned and back of L hand notable more edematous with fingers fisted and difficulty achieving neutral positioning requiring assist to open hand. Rolled pillow case placed to help maintain more netural/open positioning. MAX A for LUE sling mgt and repositioning, pt further educated in positioning, sling mgt, and AROM to complete with L hand/fingers to support recovery. Pt verbalized understanding and with constant VC and tactile cues able to assist herself in [facility] however then doses off when cues are decreased. RN notified of lethargy, sling positioning, and pain. Pt continues to benefit from skilled OT services.       If plan is discharge home, recommend the following:  A little help with walking and/or transfers;A lot of help with bathing/dressing/bathroom;Direct supervision/assist for medications management;Assist for transportation;Assistance with cooking/housework;Help with stairs or ramp for entrance;Direct supervision/assist for financial management;Supervision due to cognitive status   Equipment Recommendations  Other (comment) (defer)    Recommendations for Other Services      Precautions / Restrictions Precautions Precautions: Fall;Other (comment) Recall of  Precautions/Restrictions: Impaired Precaution/Restrictions Comments: LUE brace, wrist carpal tunnel brace Required Braces or Orthoses: Other Brace Other Brace: Sarmiento brace per Ortho, wrist carpel tunnel brace Restrictions Weight Bearing Restrictions Per Provider Order: Yes LUE Weight Bearing Per Provider Order: Non weight bearing Other Position/Activity Restrictions: no official order in chart, but given fracture assuming NWBing at this time.       Mobility Bed Mobility               General bed mobility comments: deferred 2/2 lethargy and LUE pain    Transfers                         Balance Overall balance assessment: Needs assistance   Sitting balance-Leahy Scale: Fair Sitting balance - Comments: long sitting in bed with back support and minimal pillow support, pt able to maintain midline even when sleeping, a stark improvement from past sessions                                   ADL either performed or assessed with clinical judgement   ADL Overall ADL's : Needs assistance/impaired                                            Extremity/Trunk Assessment              Vision       Perception     Praxis     Communication Communication Communication: No apparent difficulties   Cognition Arousal:  Lethargic Behavior During Therapy: Flat affect Cognition: No family/caregiver present to determine baseline, Cognition impaired             OT - Cognition Comments: Pt found sleeping but easily wakes to OT's voice, able to maintain attention briefly with VCs but then without verbal input, will dose off again and appears to reach for things/pick at her nasal cannula unintentionally                 Following commands: Impaired Following commands impaired: Follows one step commands with increased time      Cueing   Cueing Techniques: Verbal cues, Tactile cues  Exercises Other Exercises Other Exercises:  Sling found to be malpositioned and back of L hand notable more edematous with fingers fisted and difficulty achieving neutral positioning requiring assist to open hand. Rolled pillow case placed to help maintain more netural/open positioning. MAX A for LUE sling mgt and repositioning, pt further educated in positioning, sling mgt, and AROM to complete with L hand/fingers to support recovery. Pt verbalized understanding and with constant VC and tactile cues able to assist herself in [facility] however then doses off when cues are decreased.    Shoulder Instructions       General Comments      Pertinent Vitals/ Pain       Pain Assessment Pain Assessment: 0-10 Pain Score: 8  Pain Location: LUE; verbally reports significant pain, visually does not appear to be in the same amount of pain Pain Descriptors / Indicators: Grimacing, Guarding Pain Intervention(s): Limited activity within patient's tolerance, Monitored during session, Repositioned, Patient requesting pain meds-RN notified  Home Living                                          Prior Functioning/Environment              Frequency  Min 2X/week        Progress Toward Goals  OT Goals(current goals can now be found in the care plan section)  Progress towards OT goals: OT to reassess next treatment  Acute Rehab OT Goals Patient Stated Goal: have less pain OT Goal Formulation: With patient Time For Goal Achievement: 06/01/24 Potential to Achieve Goals: Good  Plan      Co-evaluation                 AM-PAC OT 6 Clicks Daily Activity     Outcome Measure   Help from another person eating meals?: A Little Help from another person taking care of personal grooming?: A Little Help from another person toileting, which includes using toliet, bedpan, or urinal?: A Little Help from another person bathing (including washing, rinsing, drying)?: A Lot Help from another person to put on and taking off  regular upper body clothing?: A Lot Help from another person to put on and taking off regular lower body clothing?: A Lot 6 Click Score: 15    End of Session Equipment Utilized During Treatment: Oxygen  OT Visit Diagnosis: Unsteadiness on feet (R26.81);Muscle weakness (generalized) (M62.81);Repeated falls (R29.6);Pain Pain - Right/Left: Left Pain - part of body: Arm   Activity Tolerance Patient limited by pain;Treatment limited secondary to medical complications (Comment) (lethargy)   Patient Left in bed;with call bell/phone within reach;with bed alarm set   Nurse Communication Patient requests pain meds;Other (comment) (sling positioning, hand edema)  Time: 8378-8366 OT Time Calculation (min): 12 min  Charges: OT General Charges $OT Visit: 1 Visit OT Treatments $Therapeutic Activity: 8-22 mins  Warren SAUNDERS., MPH, MS, OTR/L ascom [phone] 05/23/24, 4:44 PM

## 2024-05-23 NOTE — Progress Notes (Signed)
 PROGRESS NOTE    [name]  FMW:[id number] DOB: 02-Apr-1951 DOA: 05/17/2024 PCP: Vicci Duwaine SQUIBB, DO  Chief Complaint  Patient presents with   [facility] Course:  Kim Graham is a 73 year old female with chronic pain, COPD, type 2 diabetes, history of HCV status posttreatment, who suffered a fall on 6/12 and subsequently had humeral neck fracture.  At that time she was recommended to follow-up with Ortho trauma but has been unable to do so due to difficulty with transportation and ambulation at home.  On this admission she sustained another fall and laid on the floor for prolonged period of time.  When she presented she was found to have acute rhabdomyolysis with CK 7623, potassium 2.5, mag 1.5, and elevated LFTs.  She refused head CT.  She was also found to have right lower leg cellulitis, and acute swelling of her left arm.  Orthopedic surgery was consulted and again recommended outpatient follow-up with trauma surgery but has placed a new brace on her arm during this admission.  Patient is medically stable for discharge but pending SNF arrangements.  Subjective: No acute events overnight.  Patient complaining of pain in her back and butt today.  Objective: Vitals:   05/23/24 0357 05/23/24 0741 05/23/24 1100 05/23/24 1556  BP: (!) 109/48 (!) 115/56 101/76 (!) 96/44  Pulse: 65 65 94 71  Resp: 16 17 17 18   Temp: 98.2 F (36.8 C) 98.7 F (37.1 C) 98.6 F (37 C) 98.6 F (37 C)  TempSrc:      SpO2: 95% 97% 93% 98%  Weight:      Height:        Intake/Output Summary (Last 24 hours) at 05/23/2024 1658 Last data filed at 05/23/2024 1424 Gross per 24 hour  Intake 0 ml  Output --  Net 0 ml    Filed Weights   05/20/24 2242  Weight: 54.9 kg    Examination: General exam: Appears calm and comfortable, NAD  Respiratory system: No work of breathing, symmetric chest wall expansion Cardiovascular system: S1 & S2 heard, RRR.  Gastrointestinal system: Abdomen is nondistended,  soft and nontender.  Neuro: Alert and oriented x4. No focal neurological deficits. Extremities: left arm in sling, right leg with abrasion and surrounding erythema  Psychiatry: Demonstrates appropriate judgement and insight. Mood & affect appropriate for situation.   Assessment & Plan:  Principal Problem:   Rhabdomyolysis, traumatic Active Problems:   Fall at home, initial encounter   History of fracture of humerus 04/20/24   Transaminitis   Hypokalemia   Hypomagnesemia   Elevated troponin   Cellulitis of right lower extremity   Chronic obstructive pulmonary disease (HCC)   Methadone  dependence (HCC)   Diabetes mellitus type 2, diet-controlled (HCC)   Closed displaced spiral fracture of shaft of left humerus   Rhabdomyolysis - Secondary to prolonged downtime - Status post IV fluids - CK has resolved. - Continue to encourage oral hydration - Doing well with PT/OT.  Will need SNF at discharge  Left Humeral shaft fracture - Fracture sustained 6/12.  Was meant to follow-up with Ortho trauma and has not been able to do so - Plain films this admission reveal: Subacute spiral fracture of left humeral shaft and nondisplaced fracture of left proximal humerus.  Rotation is different from prior films but no significant change in displacement. - Orthopedic surgery consulted, has recommended outpatient follow-up - Has placed new splint - Difficulty maintaining her health needs at home.  Would benefit  from SNF placement.  TOC consulted and aware  Right leg cellulitis - Localized swelling and erythema to right leg.  Doppler negative for DVT - Has open skin abrasion and surrounding swelling.  This is improving. - Continue ceftriaxone , transition to Keflex  at discharge.  Complete 7 days total therapy 7/16  Frequent falls Generalized deconditioning - Patient has had difficulty managing her health needs at home for some time, further complicated by left arm fracture - Continue with PT/OT.   Patient would benefit from rehab/SNF placement - TOC consulted to assist in this.  COPD - Not currently in exacerbation - Continue current medications  Type 2 diabetes - Recent hemoglobin A1c 6.0% - Blood glucose on daily labs has been at goal.  No indication for sliding scale insulin  at this time  Chronic pain Methadone  dependence - Has been verified.  Continue at home dose - Discontinue morphine . - As needed oxycodone  for breakthrough pain, will continue to de-escalate this as able.  Currently just receiving once daily  History of HCV s/p treatment Transaminitis - Acute elevation in LFTs may be secondary to rhabdo - LFTs resolving now.  They do have some baseline elevation - Ammonia within normal limits.  Hypokalemia Hypomagnesemia - Daily CMP.  Replace as needed.  Currently WNL  Hypotension - Patient appears to be at baseline.  Keep MAP above 65.  Opioids likely contributing.  Fall precautions. - Status post fluid boluses  Depression - Continue home meds  DVT prophylaxis: Lovenox    Code Status: Full Code Disposition:  Medically ready now, pending SNF placement  Consultants:  Treatment Team:  Consulting Physician: Lorelle Hussar, MD  Procedures:  N/a  Antimicrobials:  Anti-infectives (From admission, onward)    Start     Dose/Rate Route Frequency Ordered Stop   05/18/24 0600  cefTRIAXone  (ROCEPHIN ) 1 g in sodium chloride  0.9 % 100 mL IVPB        1 g 200 mL/hr over 30 Minutes Intravenous Every 24 hours 05/17/24 0346     05/17/24 0300  cefTRIAXone  (ROCEPHIN ) 2 g in sodium chloride  0.9 % 100 mL IVPB        2 g 200 mL/hr over 30 Minutes Intravenous  Once 05/17/24 0248 05/17/24 0519       Data Reviewed: I have personally reviewed following labs and imaging studies CBC: Recent Labs  Lab 05/18/24 0609 05/19/24 0559 05/20/24 0520 05/21/24 0429 05/22/24 0556  WBC 7.3 5.8 3.8* 4.2 4.5  NEUTROABS 4.7 3.6 2.3 2.3 2.5  HGB 11.6* 10.6* 10.6* 10.3* 11.4*   HCT 37.9 34.3* 35.3* 33.1* 36.6  MCV 94.3 93.2 95.7 95.1 93.8  PLT 235 205 194 187 199   Basic Metabolic Panel: Recent Labs  Lab 05/18/24 0609 05/19/24 0559 05/20/24 0520 05/21/24 0429 05/22/24 0556  NA 144 142 144 143 143  K 3.7 3.3* 3.8 3.6 3.7  CL 109 106 110 108 105  CO2 24 29 29 29  33*  GLUCOSE 94 96 121* 111* 95  BUN 13 10 12 11 11   CREATININE 0.80 0.59 0.68 0.66 0.38*  CALCIUM 8.4* 8.4* 8.6* 8.6* 8.8*  MG 2.1 1.9 2.0 1.8 2.0  PHOS 2.7 2.9 3.9 3.9 3.3   GFR: Estimated Creatinine Clearance: 54.9 mL/min (A) (by C-G formula based on SCr of 0.38 mg/dL (L)). Liver Function Tests: Recent Labs  Lab 05/18/24 0609 05/19/24 0559 05/20/24 0520 05/21/24 0429 05/22/24 0556  AST 135* 93* 75* 52* 53*  ALT 61* 54* 55* 46* 45*  ALKPHOS 61 50 51  48 55  BILITOT 0.8 0.6 0.6 0.6 0.6  PROT 5.3* 5.4* 5.3* 5.1* 6.1*  ALBUMIN 2.5* 2.3* 2.4* 2.2* 2.5*   CBG: No results for input(s): GLUCAP in the last 168 hours.  No results found for this or any previous visit (from the past 240 hours).   Radiology Studies: No results found.   Scheduled Meds:  ARIPiprazole   30 mg Oral Daily   budesonide -glycopyrrolate -formoterol   2 puff Inhalation BID   enoxaparin  (LOVENOX ) injection  40 mg Subcutaneous Q24H   lactulose   20 g Oral TID   methadone   100 mg Oral Daily   Continuous Infusions:  cefTRIAXone  (ROCEPHIN )  IV Stopped (05/23/24 0547)     LOS: 6 days  MDM: Patient is high risk for one or more organ failure.  They necessitate ongoing hospitalization for continued IV therapies and subsequent lab monitoring. Total time spent interpreting labs and vitals, reviewing the medical record, coordinating care amongst consultants and care team members, directly assessing and discussing care with the patient and/or family: 55 min Adekunle Rohrbach, DO [facility]  To contact the attending physician between 7A-7P please use Epic Chat. To contact the covering physician during after hours  7P-7A, please review Amion.  05/23/2024, 4:58 PM   *This document has been created with the assistance of dictation software. Please excuse typographical errors. *

## 2024-05-24 DIAGNOSIS — M6282 Rhabdomyolysis: Secondary | ICD-10-CM | POA: Diagnosis not present

## 2024-05-24 LAB — CREATININE, SERUM
Creatinine, Ser: 0.77 mg/dL (ref 0.44–1.00)
GFR, Estimated: >60 mL/min (ref >60)

## 2024-05-24 LAB — VITAMIN D 25 HYDROXY (VIT D DEFICIENCY, FRACTURES): Vit D, 25-Hydroxy: 29.33 ng/mL — ABNORMAL LOW (ref 30–100)

## 2024-05-24 LAB — MAGNESIUM: Magnesium: 2.1 mg/dL (ref 1.7–2.4)

## 2024-05-24 LAB — VITAMIN B12: Vitamin B-12: 419 pg/mL (ref 180–914)

## 2024-05-24 LAB — BASIC METABOLIC PANEL WITH GFR
Anion gap: 11 (ref 5–15)
BUN: 11 mg/dL (ref 8–23)
CO2: 28 mmol/L (ref 22–32)
Calcium: 8.7 mg/dL — ABNORMAL LOW (ref 8.9–10.3)
Chloride: 106 mmol/L (ref 98–111)
Creatinine, Ser: 0.64 mg/dL (ref 0.44–1.00)
GFR, Estimated: >60 mL/min (ref >60)
Glucose, Bld: 127 mg/dL — ABNORMAL HIGH (ref 70–99)
Potassium: 3.1 mmol/L — ABNORMAL LOW (ref 3.5–5.1)
Sodium: 145 mmol/L (ref 135–145)

## 2024-05-24 LAB — IRON AND TIBC
Iron: 22 ug/dL — ABNORMAL LOW (ref 28–170)
Saturation Ratios: 8 % — ABNORMAL LOW (ref 10.4–31.8)
TIBC: 260 ug/dL (ref 250–450)
UIBC: 238 ug/dL

## 2024-05-24 LAB — PHOSPHORUS: Phosphorus: 3.3 mg/dL (ref 2.5–4.6)

## 2024-05-24 LAB — FOLATE: Folate: 16.5 ng/mL (ref >5.9)

## 2024-05-24 MED ORDER — BACITRACIN-NEOMYCIN-POLYMYXIN OINTMENT TUBE
TOPICAL_OINTMENT | Freq: Two times a day (BID) | CUTANEOUS | Status: DC
  Filled 2024-05-24: qty 14.17

## 2024-05-24 MED ORDER — POTASSIUM CHLORIDE CRYS ER 20 MEQ PO TBCR
40.0000 meq | EXTENDED_RELEASE_TABLET | Freq: Once | ORAL | Status: AC
  Administered 2024-05-24: 40 meq via ORAL
  Filled 2024-05-24: qty 2

## 2024-05-24 MED ORDER — VITAMIN C 500 MG PO TABS
500.0000 mg | ORAL_TABLET | Freq: Every day | ORAL | Status: DC
  Administered 2024-05-25: 500 mg via ORAL
  Filled 2024-05-24: qty 1

## 2024-05-24 MED ORDER — IRON SUCROSE 300 MG IVPB - SIMPLE MED
300.0000 mg | Freq: Once | Status: AC
  Administered 2024-05-24: 300 mg via INTRAVENOUS
  Filled 2024-05-24: qty 300

## 2024-05-24 MED ORDER — POLYSACCHARIDE IRON COMPLEX 150 MG PO CAPS
150.0000 mg | ORAL_CAPSULE | Freq: Every day | ORAL | Status: DC
  Administered 2024-05-25: 150 mg via ORAL
  Filled 2024-05-24: qty 1

## 2024-05-24 MED ORDER — BACITRACIN-NEOMYCIN-POLYMYXIN OINTMENT TUBE: TOPICAL_OINTMENT | Freq: Two times a day (BID) | CUTANEOUS | Status: DC

## 2024-05-24 MED ORDER — MIDODRINE HCL 5 MG PO TABS
5.0000 mg | ORAL_TABLET | Freq: Three times a day (TID) | ORAL | Status: DC
  Administered 2024-05-24: 5 mg via ORAL
  Filled 2024-05-24: qty 1

## 2024-05-24 NOTE — Progress Notes (Signed)
 PROGRESS NOTE    Kim Graham  FMW:[id number] DOB: 07/27/1951 DOA: 05/17/2024 PCP: Vicci Duwaine SQUIBB, DO  Chief Complaint  Patient presents with   [facility] Course:  Kim Graham is a 73 year old female with chronic pain, COPD, type 2 diabetes, history of HCV status posttreatment, who suffered a fall on 6/12 and subsequently had humeral neck fracture.  At that time she was recommended to follow-up with Ortho trauma but has been unable to do so due to difficulty with transportation and ambulation at home.  On this admission she sustained another fall and laid on the floor for prolonged period of time.  When she presented she was found to have acute rhabdomyolysis with CK 7623, potassium 2.5, mag 1.5, and elevated LFTs.  She refused head CT.  She was also found to have right lower leg cellulitis, and acute swelling of her left arm.  Orthopedic surgery was consulted and again recommended outpatient follow-up with trauma surgery but has placed a new brace on her arm during this admission.  Patient is medically stable for discharge but pending SNF arrangements.  Subjective: No acute events overnight.  Patient was complaining of sore on her back, and she was wondering why her legs are weak, having difficulty walking and falling. Denies any other specific complaints   Objective: Vitals:   05/24/24 0415 05/24/24 0753 05/24/24 1124 05/24/24 1612  BP: (!) 99/50 98/64 (!) 94/46 (!) 97/44  Pulse: 70 91 83 76  Resp: 19  18   Temp: 98.1 F (36.7 C) 98.1 F (36.7 C) 98.8 F (37.1 C) 98 F (36.7 C)  TempSrc:  Oral    SpO2: 94% 96% 92% 92%  Weight:      Height:        Intake/Output Summary (Last 24 hours) at 05/24/2024 1644 Last data filed at 05/24/2024 1037 Gross per 24 hour  Intake 720 ml  Output --  Net 720 ml    Filed Weights   05/20/24 2242  Weight: 54.9 kg    Examination: General exam: Appears calm and comfortable, NAD  Respiratory system: No work of breathing, symmetric  chest wall expansion Cardiovascular system: S1 & S2 heard, RRR.  Gastrointestinal system: Abdomen is nondistended, soft and nontender.  Neuro: Alert and oriented x4. No focal neurological deficits. Extremities: left arm in sling, right leg with abrasion and surrounding erythema  Psychiatry: Demonstrates appropriate judgement and insight. Mood & affect appropriate for situation.   Assessment & Plan:  Principal Problem:   Rhabdomyolysis, traumatic Active Problems:   Fall at home, initial encounter   History of fracture of humerus 04/20/24   Transaminitis   Hypokalemia   Hypomagnesemia   Elevated troponin   Cellulitis of right lower extremity   Chronic obstructive pulmonary disease (HCC)   Methadone  dependence (HCC)   Diabetes mellitus type 2, diet-controlled (HCC)   Closed displaced spiral fracture of shaft of left humerus   Rhabdomyolysis - Secondary to prolonged downtime - Status post IV fluids - CK has resolved. - Continue to encourage oral hydration - Doing well with PT/OT.  Will need SNF at discharge  Left Humeral shaft fracture - Fracture sustained 6/12.  Was meant to follow-up with Ortho trauma and has not been able to do so - Plain films this admission reveal: Subacute spiral fracture of left humeral shaft and nondisplaced fracture of left proximal humerus.  Rotation is different from prior films but no significant change in displacement. - Orthopedic surgery consulted, has recommended  outpatient follow-up - Has placed new splint - Difficulty maintaining her health needs at home.  Would benefit from SNF placement.  TOC consulted and aware  Right leg cellulitis - Localized swelling and erythema to right leg.  Doppler negative for DVT - Has open skin abrasion and surrounding swelling.  This is improving. - Continue ceftriaxone , transition to Keflex  at discharge.  Complete 7 days total therapy 7/16  Frequent falls Generalized deconditioning - Patient has had difficulty  managing her health needs at home for some time, further complicated by left arm fracture - Continue with PT/OT.  Patient would benefit from rehab/SNF placement - TOC consulted to assist in this. Check vitamin B12 and vitamin D  level   COPD - Not currently in exacerbation - Continue current medications  Type 2 diabetes - Recent hemoglobin A1c 6.0% - Blood glucose on daily labs has been at goal.  No indication for sliding scale insulin  at this time  Chronic pain Methadone  dependence - Has been verified.  Continue at home dose - Discontinue morphine . - As needed oxycodone  for breakthrough pain, will continue to de-escalate this as able.  Currently just receiving once daily  History of HCV s/p treatment Transaminitis - Acute elevation in LFTs may be secondary to rhabdo - LFTs resolving now.  They do have some baseline elevation - Ammonia within normal limits.  Hypokalemia Hypomagnesemia - Daily CMP.  Replace as needed.  Currently WNL  Hypotension - Patient appears to be at baseline.  Keep MAP above 65.  Opioids likely contributing.  Fall precautions. - Status post fluid boluses - Midodrine  5 mg p.o. 3 times daily started on 7/16 Monitor BP and titrate medications accordingly   Depression - Continue home meds  Iron  deficiency, Tsat 8%, Venofer  300 mg IV one-time dose given on 7/16, start oral iron  supplement with vitamin C .  Follow with PCP to repeat iron  profile after 3 treatments.   DVT prophylaxis: Lovenox    Code Status: Full Code Disposition:  Medically ready now, pending SNF placement  Consultants:  Treatment Team:  Consulting Physician: Lorelle Hussar, MD  Procedures:  N/a  Antimicrobials:  Anti-infectives (From admission, onward)    Start     Dose/Rate Route Frequency Ordered Stop   05/18/24 0600  cefTRIAXone  (ROCEPHIN ) 1 g in sodium chloride  0.9 % 100 mL IVPB  Status:  Discontinued        1 g 200 mL/hr over 30 Minutes Intravenous Every 24 hours  05/17/24 0346 05/24/24 1333   05/17/24 0300  cefTRIAXone  (ROCEPHIN ) 2 g in sodium chloride  0.9 % 100 mL IVPB        2 g 200 mL/hr over 30 Minutes Intravenous  Once 05/17/24 0248 05/17/24 0519       Data Reviewed: I have personally reviewed following labs and imaging studies CBC: Recent Labs  Lab 05/18/24 0609 05/19/24 0559 05/20/24 0520 05/21/24 0429 05/22/24 0556  WBC 7.3 5.8 3.8* 4.2 4.5  NEUTROABS 4.7 3.6 2.3 2.3 2.5  HGB 11.6* 10.6* 10.6* 10.3* 11.4*  HCT 37.9 34.3* 35.3* 33.1* 36.6  MCV 94.3 93.2 95.7 95.1 93.8  PLT 235 205 194 187 199   Basic Metabolic Panel: Recent Labs  Lab 05/19/24 0559 05/20/24 0520 05/21/24 0429 05/22/24 0556 05/24/24 0522 05/24/24 1016 05/24/24 1017  NA 142 144 143 143  --   --  145  K 3.3* 3.8 3.6 3.7  --   --  3.1*  CL 106 110 108 105  --   --  106  CO2 29 29 29  33*  --   --  28  GLUCOSE 96 121* 111* 95  --   --  127*  BUN 10 12 11 11   --   --  11  CREATININE 0.59 0.68 0.66 0.38* 0.77  --  0.64  CALCIUM 8.4* 8.6* 8.6* 8.8*  --   --  8.7*  MG 1.9 2.0 1.8 2.0  --   --  2.1  PHOS 2.9 3.9 3.9 3.3  --  3.3  --    GFR: Estimated Creatinine Clearance: 54.9 mL/min (by C-G formula based on SCr of 0.64 mg/dL). Liver Function Tests: Recent Labs  Lab 05/18/24 0609 05/19/24 0559 05/20/24 0520 05/21/24 0429 05/22/24 0556  AST 135* 93* 75* 52* 53*  ALT 61* 54* 55* 46* 45*  ALKPHOS 61 50 51 48 55  BILITOT 0.8 0.6 0.6 0.6 0.6  PROT 5.3* 5.4* 5.3* 5.1* 6.1*  ALBUMIN 2.5* 2.3* 2.4* 2.2* 2.5*   CBG: No results for input(s): GLUCAP in the last 168 hours.  No results found for this or any previous visit (from the past 240 hours).   Radiology Studies: No results found.   Scheduled Meds:  ARIPiprazole   30 mg Oral Daily   budesonide -glycopyrrolate -formoterol   2 puff Inhalation BID   enoxaparin  (LOVENOX ) injection  40 mg Subcutaneous Q24H   lactulose   20 g Oral TID   methadone   100 mg Oral Daily   neomycin -bacitracin -polymyxin    Topical BID   Continuous Infusions:     LOS: 7 days  MDM: Patient is high risk for one or more organ failure.  They necessitate ongoing hospitalization for continued IV therapies and subsequent lab monitoring. Total time spent interpreting labs and vitals, reviewing the medical record, coordinating care amongst consultants and care team members, directly assessing and discussing care with the patient and/or family: 55 min Elvan Sor, MD [facility]  To contact the attending physician between 7A-7P please use Epic Chat. To contact the covering physician during after hours 7P-7A, please review Amion.  05/24/2024, 4:44 PM   *This document has been created with the assistance of dictation software. Please excuse typographical errors. *

## 2024-05-24 NOTE — Progress Notes (Signed)
 Physical Therapy Treatment Patient Details Name: Kim Graham MRN: [id number] DOB: 11/06/1951 Today's Date: 05/24/2024   History of Present Illness Pt 73 y.o. female who presented after a fall on 04/20/2024 to the emergency room was found to have a left humeral shaft fracture.  Patient was placed in a splint and seen outpatient after that.  She had another fall and was down for about 5 hours in the floor. Reports having L hand weakness. PMH of COPD, depression, DM.    PT Comments  Patient alert, agreeable to PT, wakes easily to voice. Reported on going buttocks pain, minimal to no pain in LUE. Pt stated she has been working on wiggling her fingers. Pt remained at minA level for all mobility tasks. Increased ambulation distance, indicating improved activity tolerance, as well as quality of steps. Pt still experienced several LOB towards end of ambulation with fatigue. Pt back in bed with needs in reach. The patient would benefit from further skilled PT intervention to continue to progress towards goals.    If plan is discharge home, recommend the following: A lot of help with walking and/or transfers;A lot of help with bathing/dressing/bathroom;Assistance with feeding;Assist for transportation;Assistance with cooking/housework;Help with stairs or ramp for entrance;Direct supervision/assist for medications management   Can travel by private vehicle     No  Equipment Recommendations  Other (comment) (TBD)    Recommendations for Other Services       Precautions / Restrictions Precautions Precautions: Fall;Other (comment) Recall of Precautions/Restrictions: Impaired Precaution/Restrictions Comments: LUE brace, wrist carpal tunnel brace Required Braces or Orthoses: Other Brace Other Brace: Sarmiento brace per Ortho, wrist carpel tunnel brace Restrictions Weight Bearing Restrictions Per Provider Order: Yes LUE Weight Bearing Per Provider Order: Non weight bearing     Mobility  Bed  Mobility Overal bed mobility: Needs Assistance Bed Mobility: Sit to Supine, Supine to Sit     Supine to sit: Min assist, HOB elevated, Used rails Sit to supine: Min assist        Transfers Overall transfer level: Needs assistance Equipment used: 1 person hand held assist Transfers: Sit to/from Stand Sit to Stand: Min assist           General transfer comment: steadying assist    Ambulation/Gait Ambulation/Gait assistance: Contact guard assist, Min assist Gait Distance (Feet): 120 Feet Assistive device: 1 person hand held assist   Gait velocity: decreased     General Gait Details: several standing rest breaks, spO2 86-91% on room air, improved to 88% or greater with each standing break. pt required more minA for increased LOBs with fatigue towards end of ambulation   Stairs             Wheelchair Mobility     Tilt Bed    Modified Rankin (Stroke Patients Only)       Balance Overall balance assessment: Needs assistance Sitting-balance support: Feet supported Sitting balance-Leahy Scale: Fair     Standing balance support: Single extremity supported, During functional activity Standing balance-Leahy Scale: Poor                              Communication    Cognition Arousal: Alert Behavior During Therapy: Flat affect                             Following commands: Impaired Following commands impaired: Follows one step commands with increased time  Cueing Cueing Techniques: Verbal cues, Tactile cues  Exercises      General Comments        Pertinent Vitals/Pain Pain Assessment Pain Assessment: 0-10 Faces Pain Scale: Hurts little more Pain Location: buttock pain Pain Descriptors / Indicators: Grimacing, Guarding Pain Intervention(s): Limited activity within patient's tolerance, Monitored during session, Repositioned    Home Living                          Prior Function            PT Goals  (current goals can now be found in the care plan section) Progress towards PT goals: Progressing toward goals    Frequency    Min 3X/week      PT Plan      Co-evaluation              AM-PAC PT 6 Clicks Mobility   Outcome Measure  Help needed turning from your back to your side while in a flat bed without using bedrails?: A Lot Help needed moving from lying on your back to sitting on the side of a flat bed without using bedrails?: A Lot Help needed moving to and from a bed to a chair (including a wheelchair)?: A Lot Help needed standing up from a chair using your arms (e.g., wheelchair or bedside chair)?: A Lot Help needed to walk in hospital room?: A Lot Help needed climbing 3-5 steps with a railing? : A Lot 6 Click Score: 12    End of Session   Activity Tolerance: Patient limited by fatigue Patient left: with call bell/phone within reach;in bed;with bed alarm set Nurse Communication: Mobility status PT Visit Diagnosis: Other abnormalities of gait and mobility (R26.89);Difficulty in walking, not elsewhere classified (R26.2);Muscle weakness (generalized) (M62.81);Pain Pain - Right/Left: Left Pain - part of body: Shoulder     Time: 8681-8663 PT Time Calculation (min) (ACUTE ONLY): 18 min  Charges:    $Therapeutic Activity: 8-22 mins PT General Charges $$ ACUTE PT VISIT: 1 Visit                     Doyal Shams PT, DPT 1:49 PM,05/24/24

## 2024-05-24 NOTE — Consult Note (Signed)
 Patient had pilonidal cyst removed per Dr. Honor 12/29/23, not a pressure injury  Orders in the chart. WOC consulted at the time of patients admission   Re consult if needed, will not follow at this time. Thanks  Wynona Duhamel [employer], RN,CWOCN, CNS, [employer] [phone]

## 2024-05-24 NOTE — TOC Progression Note (Signed)
 Transition of Care [facility]) - Progression Note    Patient Details  Name: Kim Graham MRN: [id number] Date of Birth: 21-Aug-1951  [facility]) CM/SW Contact  [name] JAYSON Childes, RN Phone Number: 05/24/2024, 3:19 PM  Clinical Narrative:    Spoke with patient at bedside to get choice of facility. Patient has chosen [facility]. She was advised shara would be required.           Expected Discharge Plan and Services                                               Social Determinants of Health (SDOH) Interventions SDOH Screenings   Food Insecurity: No Food Insecurity (05/02/2024)   Received from [facility]  Housing: Low Risk  (05/02/2024)   Received from [facility]  Transportation Needs: Unmet Transportation Needs (05/02/2024)   Received from [facility]  Utilities: Not At Risk (05/02/2024)   Received from [facility]  Alcohol Screen: Low Risk  (08/05/2021)  Depression (PHQ2-9): Medium Risk (05/10/2024)  Financial Resource Strain: Low Risk  (05/02/2024)   Received from [facility]  Physical Activity: Insufficiently Active (08/05/2021)  Social Connections: Unknown (05/18/2024)  Stress: No Stress Concern Present (08/05/2021)  Tobacco Use: Medium Risk (05/17/2024)    Readmission Risk Interventions    05/20/2024    3:52 PM  Readmission Risk Prevention Plan  Transportation Screening Complete  PCP or Specialist Appt within 3-5 Days Complete  HRI or Home Care Consult Complete  Social Work Consult for Recovery Care Planning/Counseling Complete  Palliative Care Screening Not Applicable  Medication Review Oceanographer) Complete

## 2024-05-25 DIAGNOSIS — E1122 Type 2 diabetes mellitus with diabetic chronic kidney disease: Secondary | ICD-10-CM | POA: Diagnosis not present

## 2024-05-25 DIAGNOSIS — G894 Chronic pain syndrome: Secondary | ICD-10-CM | POA: Diagnosis not present

## 2024-05-25 DIAGNOSIS — R7401 Elevation of levels of liver transaminase levels: Secondary | ICD-10-CM | POA: Diagnosis not present

## 2024-05-25 DIAGNOSIS — G5632 Lesion of radial nerve, left upper limb: Secondary | ICD-10-CM | POA: Diagnosis not present

## 2024-05-25 DIAGNOSIS — R2689 Other abnormalities of gait and mobility: Secondary | ICD-10-CM | POA: Diagnosis not present

## 2024-05-25 DIAGNOSIS — N390 Urinary tract infection, site not specified: Secondary | ICD-10-CM | POA: Diagnosis not present

## 2024-05-25 DIAGNOSIS — L89154 Pressure ulcer of sacral region, stage 4: Secondary | ICD-10-CM | POA: Diagnosis not present

## 2024-05-25 DIAGNOSIS — L89316 Pressure-induced deep tissue damage of right buttock: Secondary | ICD-10-CM | POA: Diagnosis not present

## 2024-05-25 DIAGNOSIS — S4992XD Unspecified injury of left shoulder and upper arm, subsequent encounter: Secondary | ICD-10-CM | POA: Diagnosis present

## 2024-05-25 DIAGNOSIS — J449 Chronic obstructive pulmonary disease, unspecified: Secondary | ICD-10-CM | POA: Diagnosis not present

## 2024-05-25 DIAGNOSIS — S42342D Displaced spiral fracture of shaft of humerus, left arm, subsequent encounter for fracture with routine healing: Secondary | ICD-10-CM | POA: Diagnosis not present

## 2024-05-25 DIAGNOSIS — E559 Vitamin D deficiency, unspecified: Secondary | ICD-10-CM | POA: Diagnosis not present

## 2024-05-25 DIAGNOSIS — W19XXXD Unspecified fall, subsequent encounter: Secondary | ICD-10-CM | POA: Diagnosis not present

## 2024-05-25 DIAGNOSIS — L03115 Cellulitis of right lower limb: Secondary | ICD-10-CM | POA: Diagnosis not present

## 2024-05-25 DIAGNOSIS — I959 Hypotension, unspecified: Secondary | ICD-10-CM | POA: Diagnosis not present

## 2024-05-25 DIAGNOSIS — M24542 Contracture, left hand: Secondary | ICD-10-CM | POA: Diagnosis not present

## 2024-05-25 DIAGNOSIS — D509 Iron deficiency anemia, unspecified: Secondary | ICD-10-CM | POA: Diagnosis not present

## 2024-05-25 DIAGNOSIS — S42352G Displaced comminuted fracture of shaft of humerus, left arm, subsequent encounter for fracture with delayed healing: Secondary | ICD-10-CM | POA: Diagnosis not present

## 2024-05-25 DIAGNOSIS — M6281 Muscle weakness (generalized): Secondary | ICD-10-CM | POA: Diagnosis not present

## 2024-05-25 DIAGNOSIS — L03116 Cellulitis of left lower limb: Secondary | ICD-10-CM | POA: Diagnosis not present

## 2024-05-25 DIAGNOSIS — S42202A Unspecified fracture of upper end of left humerus, initial encounter for closed fracture: Secondary | ICD-10-CM | POA: Diagnosis not present

## 2024-05-25 DIAGNOSIS — M6282 Rhabdomyolysis: Secondary | ICD-10-CM | POA: Diagnosis not present

## 2024-05-25 DIAGNOSIS — E119 Type 2 diabetes mellitus without complications: Secondary | ICD-10-CM | POA: Diagnosis not present

## 2024-05-25 DIAGNOSIS — R296 Repeated falls: Secondary | ICD-10-CM | POA: Diagnosis not present

## 2024-05-25 DIAGNOSIS — E876 Hypokalemia: Secondary | ICD-10-CM | POA: Diagnosis not present

## 2024-05-25 DIAGNOSIS — S4422XA Injury of radial nerve at upper arm level, left arm, initial encounter: Secondary | ICD-10-CM | POA: Diagnosis not present

## 2024-05-25 DIAGNOSIS — N189 Chronic kidney disease, unspecified: Secondary | ICD-10-CM | POA: Diagnosis not present

## 2024-05-25 DIAGNOSIS — Z9181 History of falling: Secondary | ICD-10-CM | POA: Diagnosis not present

## 2024-05-25 LAB — CBC
HCT: 35 % — ABNORMAL LOW (ref 36.0–46.0)
Hemoglobin: 10.6 g/dL — ABNORMAL LOW (ref 12.0–15.0)
MCH: 28.6 pg (ref 26.0–34.0)
MCHC: 30.3 g/dL (ref 30.0–36.0)
MCV: 94.3 fL (ref 80.0–100.0)
Platelets: 181 K/uL (ref 150–400)
RBC: 3.71 MIL/uL — ABNORMAL LOW (ref 3.87–5.11)
RDW: 15.5 % (ref 11.5–15.5)
WBC: 4.1 K/uL (ref 4.0–10.5)
nRBC: 0 % (ref 0.0–0.2)

## 2024-05-25 LAB — BASIC METABOLIC PANEL WITH GFR
Anion gap: 5 (ref 5–15)
BUN: 14 mg/dL (ref 8–23)
CO2: 27 mmol/L (ref 22–32)
Calcium: 8.8 mg/dL — ABNORMAL LOW (ref 8.9–10.3)
Chloride: 111 mmol/L (ref 98–111)
Creatinine, Ser: 0.75 mg/dL (ref 0.44–1.00)
GFR, Estimated: >60 mL/min (ref >60)
Glucose, Bld: 103 mg/dL — ABNORMAL HIGH (ref 70–99)
Potassium: 4.5 mmol/L (ref 3.5–5.1)
Sodium: 143 mmol/L (ref 135–145)

## 2024-05-25 MED ORDER — VITAMIN D (ERGOCALCIFEROL) 1.25 MG (50000 UNIT) PO CAPS: 50000.0000 [IU] | ORAL_CAPSULE | ORAL | Status: AC

## 2024-05-25 MED ORDER — POLYSACCHARIDE IRON COMPLEX 150 MG PO CAPS: 150.0000 mg | ORAL_CAPSULE | Freq: Every day | ORAL | Status: AC

## 2024-05-25 MED ORDER — ACETAMINOPHEN 325 MG PO TABS: 650.0000 mg | ORAL_TABLET | Freq: Four times a day (QID) | ORAL | Status: AC | PRN

## 2024-05-25 MED ORDER — MIDODRINE HCL 10 MG PO TABS: 10.0000 mg | ORAL_TABLET | Freq: Three times a day (TID) | ORAL | Status: AC

## 2024-05-25 MED ORDER — METHADONE HCL 10 MG/ML PO CONC: 100.0000 mg | Freq: Every day | ORAL | 0 refills | Status: DC

## 2024-05-25 MED ORDER — ASCORBIC ACID 500 MG PO TABS: 500.0000 mg | ORAL_TABLET | Freq: Every day | ORAL | Status: AC

## 2024-05-25 MED ORDER — CYANOCOBALAMIN 500 MCG PO TABS: 500.0000 ug | ORAL_TABLET | Freq: Every day | ORAL | Status: AC

## 2024-05-25 MED ORDER — MIDODRINE HCL 5 MG PO TABS
10.0000 mg | ORAL_TABLET | Freq: Three times a day (TID) | ORAL | Status: DC
  Administered 2024-05-25 (×3): 10 mg via ORAL
  Filled 2024-05-25 (×3): qty 2

## 2024-05-25 MED ORDER — BACITRACIN-NEOMYCIN-POLYMYXIN OINTMENT TUBE: 1.0000 | TOPICAL_OINTMENT | Freq: Two times a day (BID) | CUTANEOUS | Status: AC

## 2024-05-25 MED ORDER — VITAMIN B-12 1000 MCG PO TABS
500.0000 ug | ORAL_TABLET | Freq: Every day | ORAL | Status: DC
  Administered 2024-05-25: 500 ug via ORAL
  Filled 2024-05-25: qty 1

## 2024-05-25 MED ORDER — VITAMIN D (ERGOCALCIFEROL) 1.25 MG (50000 UNIT) PO CAPS
50000.0000 [IU] | ORAL_CAPSULE | ORAL | Status: DC
  Administered 2024-05-25: 50000 [IU] via ORAL
  Filled 2024-05-25: qty 1

## 2024-05-25 NOTE — TOC Transition Note (Addendum)
 Transition of Care [facility]) - Discharge Note   Patient Details  Name: Kim Graham MRN: [id number] Date of Birth: May 12, 1951  Transition of Care [facility]) CM/SW Contact:  Antinette Keough C Terrica Duecker, RN Phone Number: 05/25/2024, 2:35 PM   Clinical Narrative:     Spoke with patient at bedside to advised she was approved for [facility] and was discharging today. Patient stated she no longer wanted to go to [facility] and had [facility] instead. Patient begin to cry and stated she thought she could change her mind.   Spoke with [place] from [facility]. Patient can be accepted today.  RNCM contacted Therisa from [facility] to advised patient was not coming.   Spoke with Alfonso from [facility]. Auth changed to [facility]  approval number [id number]  Per [place] at [facility]  Patient assigned room # 76-A Report will be called to [phone] Face sheet and medical necessity forms printed to the floor to be added to the EMS pack EMS arranged Around 5pm.  Discharge summary and SNF transfer report sent in HUB.  Nurse notified of details Patient advised she would going to [facility] as requested TOC signing off.    Final next level of care: Skilled Nursing Facility Barriers to Discharge: Barriers Resolved   Patient Goals and CMS Choice Patient states their goals for this hospitalization and ongoing recovery are:: SNF          Discharge Placement              Patient chooses bed at: [facility] Patient to be transferred to facility by: [facility]   Patient and family notified of of transfer: 05/25/24  Discharge Plan and Services Additional resources added to the After Visit Summary for                                       Social Drivers of Health (SDOH) Interventions SDOH Screenings   Food Insecurity: No Food Insecurity (05/02/2024)   Received [facility]  Housing: Low Risk  (05/02/2024)   Received from [facility]  Transportation Needs: Unmet Transportation Needs (05/02/2024)   Received from [facility]  Utilities: Not At Risk (05/02/2024)   Received from [facility]  Alcohol Screen: Low Risk  (08/05/2021)  Depression (PHQ2-9): Medium Risk (05/10/2024)  Financial Resource Strain: Low Risk  (05/02/2024)   Received from [facility]  Physical Activity: Insufficiently Active (08/05/2021)  Social Connections: Unknown (05/18/2024)  Stress: No Stress Concern Present (08/05/2021)  Tobacco Use: Medium Risk (05/17/2024)     Readmission Risk Interventions    05/20/2024    3:52 PM  Readmission Risk Prevention Plan  Transportation Screening Complete  PCP or Specialist Appt within 3-5 Days Complete  HRI or Home Care Consult Complete  Social Work Consult for Recovery Care Planning/Counseling Complete  Palliative Care Screening Not Applicable  Medication Review Oceanographer) Complete

## 2024-05-25 NOTE — TOC Progression Note (Addendum)
 Transition of Care [facility]) - Progression Note    Patient Details  Name: Kim Graham MRN: [id number] Date of Birth: 01/20/1951  Transition of Care [facility]) CM/SW Contact  [name] JAYSON Childes, RN Phone Number: 05/25/2024, 12:05 PM  Clinical Narrative:    Per [facility] assistant Rojelio barrows still pending for [facility] .  12:22pm SNF approved 05/25/24-05/29/24 plan auth id #[id number]   Attempt to reach Therisa in admissions at [facility]. No answer. Message sent requesting confirmation that patient can admit today.  MD notified of authorization   12:59pm Per Therisa patient can be accepted.  MD made aware.          Expected Discharge Plan and Services                                               Social Determinants of Health (SDOH) Interventions SDOH Screenings   Food Insecurity: No Food Insecurity (05/02/2024)   Received from [facility]  Housing: Low Risk  (05/02/2024)   Received from [facility]  Transportation Needs: Unmet Transportation Needs (05/02/2024)   Received from [facility]  Utilities: Not At Risk (05/02/2024)   Received from [facility]  Alcohol Screen: Low Risk  (08/05/2021)  Depression (PHQ2-9): Medium Risk (05/10/2024)  Financial Resource Strain: Low Risk  (05/02/2024)   Received from [facility]  Physical Activity: Insufficiently Active (08/05/2021)  Social Connections: Unknown (05/18/2024)  Stress: No Stress Concern Present (08/05/2021)  Tobacco Use: Medium Risk (05/17/2024)    Readmission Risk Interventions    05/20/2024    3:52 PM  Readmission Risk Prevention Plan  Transportation Screening Complete  PCP or Specialist Appt within 3-5 Days Complete  HRI or Home Care Consult Complete  Social Work Consult for Recovery Care Planning/Counseling Complete  Palliative Care Screening Not Applicable  Medication Review Oceanographer) Complete

## 2024-05-25 NOTE — Discharge Summary (Signed)
 [facility] Discharge Summary   Patient: Kim Graham FMW:[id number]  PCP: Vicci Duwaine SQUIBB, DO  Date of admission: 05/17/2024   Date of discharge:  05/25/2024     Discharge Diagnoses:  Principal Problem:   Rhabdomyolysis, traumatic Active Problems:   Fall at home, initial encounter   History of fracture of humerus 04/20/24   Transaminitis   Hypokalemia   Hypomagnesemia   Elevated troponin   Cellulitis of right lower extremity   Chronic obstructive pulmonary disease (HCC)   Methadone  dependence (HCC)   Diabetes mellitus type 2, diet-controlled (HCC)   Closed displaced spiral fracture of shaft of left humerus   Admitted From: Home Disposition:  SNF   Recommendations for Outpatient Follow-up:  Follow-up with PCP, patient should be seen by an MD in 1 to 2 days, monitor BP and titrate midodrine  dose accordingly, it was started due to low blood pressure.  Follow holding parameters. Repeat iron  profile, vitamin D  level and vitamin B12 level after 3 to 6 months. Follow-up with orthopedic surgery for left femoral fracture in 1 to 2 weeks Follow up LABS/TEST: As above   Diet recommendation: Regular diet  Activity: The patient is advised to gradually reintroduce usual activities, as tolerated  Discharge Condition: stable  Code Status: Full code   History of present illness: As per the H and P dictated on admission  Hospital Course:  Kim Graham is a 73 year old female with chronic pain, COPD, type 2 diabetes, history of HCV status posttreatment, who suffered a fall on 6/12 and subsequently had humeral neck fracture.  At that time she was recommended to follow-up with Ortho trauma but has been unable to do so due to difficulty with transportation and ambulation at home.  On this admission she sustained another fall and laid on the floor for prolonged period of time.  When she presented she was found to have acute rhabdomyolysis with CK 7623, potassium 2.5, mag 1.5, and elevated  LFTs.  She refused head CT.  She was also found to have right lower leg cellulitis, and acute swelling of her left arm.  Orthopedic surgery was consulted and again recommended outpatient follow-up with trauma surgery but has placed a new brace on her arm during this admission.  Patient is medically stable for discharge but pending SNF arrangements.    Assessment & Plan:   # Rhabdomyolysis: Secondary to prolonged downtime Status post IV fluids, CK trended down, rhabdo resolved. Continue to encourage oral hydration. Doing well with PT/OT.  SNF placement, insurance Auth approved.  Patient is stable to discharge    # Left Humeral shaft fracture: Fracture sustained on 6/12.  Was meant to follow-up with Ortho trauma and has not been able to do so Plain films this admission reveal: Subacute spiral fracture of left humeral shaft and nondisplaced fracture of left proximal humerus.  Rotation is different from prior films but no significant change in displacement.  - Orthopedic surgery consulted, has recommended outpatient follow-up. Has placed new splint.  She is having difficulty maintaining her health needs at home.  Would benefit from SNF placement.  TOC consulted, SNF was arranged.  Patient is stable to discharge   # Right leg cellulitis Localized swelling and erythema to right leg.  Doppler negative for DVT. Has open skin abrasion and surrounding swelling.  This is improving. S/p Ceftriaxone .  Completed 7-day course on 7/16   # Frequent falls due to Generalized deconditioning Patient has had difficulty managing her health needs at home for  some time, further complicated by left arm fracture - Continue with PT/OT.  Patient would benefit from rehab/SNF placement # B12 level 419, goal >400, started oral supplement to prevent deficiency. # Vitamin D  insufficiency: started vitamin D  50,000 units p.o. weekly, follow with PCP to repeat vitamin D  level after 3 to 6 months.  # COPD: Not currently in  exacerbation, Continue current medications # Type 2 diabetes: Recent hemoglobin A1c 6.0% - Blood glucose on daily labs has been at goal.  No indication for sliding scale insulin  at this time  # Chronic pain, Methadone  dependence. Has been verified.  Continue at home dose. S/p oxycodone  prn for breakthrough pain,    # History of HCV s/p treatment, Transaminitis resolved  Acute elevation in LFTs may be secondary to rhabdo. LFTs trending down.  They do have some baseline elevation - Ammonia within normal limits.   # Hypokalemia.  Potassium repleted and resolved. # Hypomagnesemia, mag repleted and resolved. # Hypotension: s/p IVF bolus, it seems she runs low blood pressure at baseline.  Started midodrine  5 mg p.o. 3 times daily on 7/16, increased today to 10 mg p.o. 3 times daily with holding parameters.  Monitor BP and titrate dose accordingly. # Depression: Continue home meds   # Iron  deficiency, Tsat 8%, Venofer  300 mg IV one-time dose given on 7/16, start oral iron  supplement with vitamin C .  Follow with PCP to repeat iron  profile after 3 treatments.  Body mass index is 20.78 kg/m.  Nutrition Interventions:  Pain control  - [place]  Controlled Substance Reporting System database could not be reviewed as website was not working  - Patient takes methadone  100 mg p.o. daily.  So prescription given as per SNF requirement.   - Patient was instructed, not to drive, operate heavy machinery, perform activities at heights, swimming or participation in water  activities or provide baby sitting services while on Pain, Sleep and Anxiety Medications; until her outpatient Physician has advised to do so again.  - Also recommended to not to take more than prescribed Pain, Sleep and Anxiety Medications.  Patient was seen by physical therapy, who recommended Therapy, SNF placement, which was arranged. On the day of the discharge the patient's vitals were stable, and no other acute medical condition  were reported by patient. the patient was felt safe to be discharge at [facility].  Consultants: Orthopedic surgery Procedures: Sarmiento brace for left spiral humeral shaft fracture   Discharge Exam: General: Appear in no distress, no Rash; Oral Mucosa Clear, moist. Cardiovascular: S1 and S2 Present, no Murmur, Respiratory: normal respiratory effort, Bilateral Air entry present and no Crackles, no wheezes Abdomen: Bowel Sound present, Soft and no tenderness, no hernia Extremities: no Pedal edema, no calf tenderness Neurology: alert and oriented to time, place, and person affect appropriate.  Filed Weights   05/20/24 2242  Weight: 54.9 kg   Vitals:   05/25/24 0814 05/25/24 1137  BP: (!) 99/43 (!) 111/52  Pulse: 72 77  Resp:    Temp:  98.4 F (36.9 C)  SpO2: 92% 90%    DISCHARGE MEDICATION: Allergies as of 05/25/2024       Reactions   Doxycycline  Diarrhea   Very bad diarrhea        Medication List     TAKE these medications    acetaminophen  325 MG tablet Commonly known as: TYLENOL  Take 2 tablets (650 mg total) by mouth every 6 (six) hours as needed for mild pain (pain score 1-3), fever or headache (or  Fever >/= 101).   albuterol  (2.5 MG/3ML) 0.083% nebulizer solution Commonly known as: PROVENTIL  Take 3 mLs (2.5 mg total) by nebulization every 6 (six) hours as needed for wheezing or shortness of breath.   albuterol  108 (90 Base) MCG/ACT inhaler Commonly known as: VENTOLIN  HFA Inhale 2 puffs into the lungs every 6 (six) hours as needed for wheezing or shortness of breath.   ARIPiprazole  30 MG tablet Commonly known as: ABILIFY  Take 1 tablet (30 mg total) by mouth daily.   ascorbic acid  500 MG tablet Commonly known as: VITAMIN C  Take 1 tablet (500 mg total) by mouth daily. Start taking on: May 26, 2024   Breztri  Aerosphere 160-9-4.8 MCG/ACT Aero inhaler Generic drug: budesonide -glycopyrrolate -formoterol  Inhale 2 puffs into the lungs 2 (two) times daily.    cyanocobalamin  500 MCG tablet Commonly known as: VITAMIN B12 Take 1 tablet (500 mcg total) by mouth daily. Start taking on: May 26, 2024   fluticasone  50 MCG/ACT nasal spray Commonly known as: FLONASE  Place 2 sprays into both nostrils daily.   iron  polysaccharides 150 MG capsule Commonly known as: NIFEREX Take 1 capsule (150 mg total) by mouth daily. Start taking on: May 26, 2024   methadone  10 MG/ML solution Commonly known as: DOLOPHINE  Take 10 mLs (100 mg total) by mouth daily.   midodrine  10 MG tablet Commonly known as: PROAMATINE  Take 1 tablet (10 mg total) by mouth 3 (three) times daily with meals. Hold if SBP > 120 mmHg and/or HR <65   Multi-Vitamin tablet Take 1 tablet by mouth daily.   neomycin -bacitracin -polymyxin Oint Commonly known as: NEOSPORIN Apply 1 Application topically 2 (two) times daily for 7 days. Apply to pilonidal sinus surgery area   Spacer/Aero-Holding [facility] Use as directed Dx: COPD   Vitamin D  (Ergocalciferol ) 1.25 MG (50000 UNIT) Caps capsule Commonly known as: DRISDOL  Take 1 capsule (50,000 Units total) by mouth every 7 (seven) days. Start taking on: June 01, 2024               Discharge Care Instructions  (From admission, onward)           Start     Ordered   05/25/24 0000  Discharge wound care:       Comments: As per wound care RN   05/25/24 1351           Allergies  Allergen Reactions   Doxycycline  Diarrhea    Very bad diarrhea   Discharge Instructions     Call MD for:  difficulty breathing, headache or visual disturbances   Complete by: As directed    Call MD for:  extreme fatigue   Complete by: As directed    Call MD for:  persistant dizziness or light-headedness   Complete by: As directed    Call MD for:  persistant nausea and vomiting   Complete by: As directed    Call MD for:  redness, tenderness, or signs of infection (pain, swelling, redness, odor or green/yellow discharge around incision  site)   Complete by: As directed    Call MD for:  severe uncontrolled pain   Complete by: As directed    Call MD for:  temperature >100.4   Complete by: As directed    Diet general   Complete by: As directed    Discharge instructions   Complete by: As directed    Follow-up with PCP, patient should be seen by an MD in 1 to 2 days, monitor BP and titrate midodrine  dose accordingly,  it was started due to low blood pressure.  Follow holding parameters. Repeat iron  profile, vitamin D  level and vitamin B12 level after 3 to 6 months. Follow-up with orthopedic surgery for left femoral fracture in 1 to 2 weeks   Discharge wound care:   Complete by: As directed    As per wound care RN   Increase activity slowly   Complete by: As directed        The results of significant diagnostics from this hospitalization (including imaging, microbiology, ancillary and laboratory) are listed below for reference.    Significant Diagnostic Studies: US  Venous Img Lower Unilateral Right (DVT) Result Date: 05/17/2024 CLINICAL DATA:  Erythema EXAM: RIGHT LOWER EXTREMITY VENOUS DOPPLER ULTRASOUND TECHNIQUE: Gray-scale sonography with compression, as well as color and duplex ultrasound, were performed to evaluate the deep venous system(s) from the level of the common femoral vein through the popliteal and proximal calf veins. COMPARISON:  None Available. FINDINGS: VENOUS Normal compressibility of the common femoral, superficial femoral, and popliteal veins, as well as the visualized calf veins. Visualized portions of profunda femoral vein and great saphenous vein unremarkable. No filling defects to suggest DVT on grayscale or color Doppler imaging. Doppler waveforms show normal direction of venous flow, normal respiratory plasticity and response to augmentation. Limited views of the contralateral common femoral vein are unremarkable. OTHER Mildly enlarged lymph nodes are present in the right inguinal lymph node basin which  may be reactive. Limitations: none IMPRESSION: 1. No evidence of right lower extremity DVT. Electronically Signed   By: Maude Naegeli M.D.   On: 05/17/2024 14:41   DG Humerus Left Result Date: 05/17/2024 CLINICAL DATA:  Left humeral fracture with original injury 04/20/2024, fell again yesterday, with increasing pain. EXAM: LEFT HUMERUS - 2+ VIEW COMPARISON:  AP and lateral left humerus 04/25/2024 FINDINGS: Two views. Fiberglass casting material again is noted. A nondisplaced left humeral neck fracture extending through the greater tuberosity demonstrates no interval displacement. There has been a change in the spiral proximal to mid shaft humeral fracture alignment. There is now 4.3 cm of over-riding towards the anteromedial aspect of the distal fragment relative to the proximal fragment. The distal fragment is displaced anteriorly up to 2/3 of the bone width, medially up to 1/2 of a shaft width, with slight anterior and medial angulation of the distal fragment with improvement in the degree of angulation. No healing callus is yet seen. There is osteopenia without evidence of new fractures or primary pathologic process. Unchanged mild swelling in the upper arm soft tissues. IMPRESSION: 1. Change in the spiral proximal to mid shaft humeral fracture alignment, with 4.3 cm of over-riding towards the anteromedial aspect of the distal fragment relative to the proximal fragment. 2. The distal fragment is displaced anteriorly up to 2/3 of the bone width, medially up to 1/2 of a shaft width, with slight anterior and medial angulation of the distal fragment with improvement in the degree of angulation. 3. No interval displacement of the nondisplaced left humeral neck fracture extending through the greater tuberosity. 4. Osteopenia. Electronically Signed   By: Francis Quam M.D.   On: 05/17/2024 02:33   DG HIPS BILAT WITH PELVIS MIN 5 VIEWS Result Date: 05/17/2024 CLINICAL DATA:  Fall, bruising EXAM: DG HIP (WITH OR  WITHOUT PELVIS) 5+V BILAT COMPARISON:  None Available. FINDINGS: There is no evidence of hip fracture or dislocation. There is no evidence of arthropathy or other focal bone abnormality. IMPRESSION: Negative. Electronically Signed   By: Franky  Dover M.D.   On: 05/17/2024 02:27   DG Tibia/Fibula Right Result Date: 05/17/2024 CLINICAL DATA:  Fall, bruising EXAM: RIGHT TIBIA AND FIBULA - 2 VIEW COMPARISON:  None Available. FINDINGS: There is no evidence of fracture or other focal bone lesions. Soft tissues are unremarkable. IMPRESSION: Negative. Electronically Signed   By: Franky Crease M.D.   On: 05/17/2024 02:26   DG Hand 2 View Left Result Date: 05/17/2024 CLINICAL DATA:  Fall, bruising EXAM: LEFT HAND - 2 VIEW COMPARISON:  None Available. FINDINGS: There is no evidence of fracture or dislocation. There is no evidence of arthropathy or other focal bone abnormality. Soft tissues are unremarkable. IMPRESSION: Negative. Electronically Signed   By: Franky Crease M.D.   On: 05/17/2024 02:26   DG Forearm Left Result Date: 05/17/2024 CLINICAL DATA:  Fall, bruising, pain EXAM: LEFT FOREARM - 2 VIEW COMPARISON:  None Available. FINDINGS: There is no evidence of fracture or other focal bone lesions. Soft tissues are unremarkable. IMPRESSION: No acute bony abnormality within the left forearm. Electronically Signed   By: Franky Crease M.D.   On: 05/17/2024 02:25    Microbiology: No results found for this or any previous visit (from the past 240 hours).   Labs: CBC: Recent Labs  Lab 05/19/24 0559 05/20/24 0520 05/21/24 0429 05/22/24 0556 05/25/24 0432  WBC 5.8 3.8* 4.2 4.5 4.1  NEUTROABS 3.6 2.3 2.3 2.5  --   HGB 10.6* 10.6* 10.3* 11.4* 10.6*  HCT 34.3* 35.3* 33.1* 36.6 35.0*  MCV 93.2 95.7 95.1 93.8 94.3  PLT 205 194 187 199 181   Basic Metabolic Panel: Recent Labs  Lab 05/19/24 0559 05/20/24 0520 05/21/24 0429 05/22/24 0556 05/24/24 0522 05/24/24 1016 05/24/24 1017 05/25/24 0432  NA 142 144  143 143  --   --  145 143  K 3.3* 3.8 3.6 3.7  --   --  3.1* 4.5  CL 106 110 108 105  --   --  106 111  CO2 29 29 29  33*  --   --  28 27  GLUCOSE 96 121* 111* 95  --   --  127* 103*  BUN 10 12 11 11   --   --  11 14  CREATININE 0.59 0.68 0.66 0.38* 0.77  --  0.64 0.75  CALCIUM 8.4* 8.6* 8.6* 8.8*  --   --  8.7* 8.8*  MG 1.9 2.0 1.8 2.0  --   --  2.1  --   PHOS 2.9 3.9 3.9 3.3  --  3.3  --   --    Liver Function Tests: Recent Labs  Lab 05/19/24 0559 05/20/24 0520 05/21/24 0429 05/22/24 0556  AST 93* 75* 52* 53*  ALT 54* 55* 46* 45*  ALKPHOS 50 51 48 55  BILITOT 0.6 0.6 0.6 0.6  PROT 5.4* 5.3* 5.1* 6.1*  ALBUMIN 2.3* 2.4* 2.2* 2.5*   No results for input(s): LIPASE, AMYLASE in the last 168 hours. Recent Labs  Lab 05/18/24 1558  AMMONIA 34   Cardiac Enzymes: No results for input(s): CKTOTAL, CKMB, CKMBINDEX, TROPONINI in the last 168 hours. BNP (last 3 results) No results for input(s): BNP in the last 8760 hours. CBG: No results for input(s): GLUCAP in the last 168 hours.  Time spent: 35 minutes  Signed:  Elvan Sor  [facility] 05/25/2024 1:51 PM

## 2024-05-25 NOTE — Progress Notes (Signed)
 Occupational Therapy Treatment Patient Details Name: Kim Graham MRN: [id number] DOB: 03/15/1951 Today's Date: 05/25/2024   History of present illness Pt 73 y.o. female who presented after a fall on 04/20/2024 to the emergency room was found to have a left humeral shaft fracture.  Patient was placed in a splint and seen outpatient after that.  She had another fall and was down for about 5 hours in the floor. Reports having L hand weakness. PMH of COPD, depression, DM.   OT comments  Pt seen for OT treatment on this date. Upon arrival to room pt semi supine in bed, agreeable to tx. Pt requires MINA to come to the EOB for SLUMS testing, pt often leaned into the [facility] during testing to rest. Demonstrated improved activity tolerance throughout session.  [employer] Mental Status Examination Orientation: 3/3  Calculations: 0/3 Naming animals: 1/3 Patient named 8 animals (0 points is 0-4 animals; 1 is 5-9 animals; 2 is 10-14 animals; 3 is 15+ animals) Recall: 1/5  Attention: 0/2 Clock drawing: 2/4 Visual Processing: 2/2 Paragraph Memory: 4/8  Total: 13/30  The SLUMS is a 30-point screening questionnaire that tests orientation, memory, attention, problem solving, and executive function. Pt scored a 13/30, thus scoring at a Dementia level of cognition. This indicates the need for further assessment. It is noted that during the session the pt often required the question to be repeated and required further clarification of questions. Pt with noted impairments in attention, recall and memory, limiting her ability to participate functionally in ADL/IADLs safely.  Pt making progress toward goals, will continue to follow POC. Discharge recommendation remains appropriate.         If plan is discharge home, recommend the following:  A little help with walking and/or transfers;A lot of help with bathing/dressing/bathroom;Direct supervision/assist for medications management;Assist for  transportation;Assistance with cooking/housework;Help with stairs or ramp for entrance;Direct supervision/assist for financial management;Supervision due to cognitive status   Equipment Recommendations  Other (comment)    Recommendations for Other Services      Precautions / Restrictions Precautions Precautions: Fall;Other (comment) Recall of Precautions/Restrictions: Impaired Precaution/Restrictions Comments: LUE brace, wrist carpal tunnel brace Required Braces or Orthoses: Other Brace Other Brace: Sarmiento brace per Ortho, wrist carpel tunnel brace Restrictions Weight Bearing Restrictions Per Provider Order: Yes LUE Weight Bearing Per Provider Order: Non weight bearing Other Position/Activity Restrictions: no official order in chart, but given fracture assuming NWBing at this time.       Mobility Bed Mobility Overal bed mobility: Needs Assistance Bed Mobility: Supine to Sit, Sit to Supine     Supine to sit: Min assist, HOB elevated, Used rails Sit to supine: Min assist                               Balance Overall balance assessment: Needs assistance Sitting-balance support: Feet supported Sitting balance-Leahy Scale: Fair Sitting balance - Comments: often leaned against HOB during SLUMS testing                                   ADL either performed or assessed with clinical judgement   ADL Overall ADL's : Needs assistance/impaired     Grooming: Wash/dry face;Set up;Bed level  General ADL Comments: Focused on completing the SLUMS exam during session, defered futher ADL tasks to next avaiable session.    Communication Communication Communication: No apparent difficulties   Cognition Arousal: Alert Behavior During Therapy: WFL for tasks assessed/performed Cognition: No family/caregiver present to determine baseline, Cognition impaired     Awareness: Intellectual awareness intact, Online  awareness impaired Memory impairment (select all impairments): Short-term memory, Working [employer] fast streamer, Armed forces training and education officer functioning impairment (select all impairments): Initiation, Reasoning, Problem solving OT - Cognition Comments: Seemly more aware on this date. Completed SLUMS examination during OT session. Pt seated upright on the EOB and author seated at bedside.                 Following commands: Impaired Following commands impaired: Follows one step commands with increased time      Cueing   Cueing Techniques: Verbal cues, Tactile cues  Exercises Exercises: Other exercises Other Exercises Other Exercises: Edu: Checked sling placement, adjusted pillows for comfort, explained the reasoning for SLUMS testing    Shoulder Instructions       General Comments Pt reports feeling more like herself on this date.    Pertinent Vitals/ Pain       Pain Assessment Pain Assessment: 0-10 Pain Score: 4  Pain Location: buttock pain Pain Descriptors / Indicators: Aching, Discomfort Pain Intervention(s): Limited activity within patient's tolerance, Monitored during session, Repositioned  Home Living                                          Prior Functioning/Environment              Frequency  Min 2X/week        Progress Toward Goals  OT Goals(current goals can now be found in the care plan section)  Progress towards OT goals: Progressing toward goals  Acute Rehab OT Goals OT Goal Formulation: With patient Time For Goal Achievement: 06/01/24 Potential to Achieve Goals: Good ADL Goals Pt Will Perform Upper Body Dressing: with caregiver independent in assisting;with modified independence;sitting Pt Will Perform Lower Body Dressing: with modified independence;sit to/from stand Pt Will Transfer to Toilet: with modified independence;ambulating Additional ADL Goal #1: Pt/caregiver will demo mod indep with LUE brace mgt.  Plan       Co-evaluation                 AM-PAC OT 6 Clicks Daily Activity     Outcome Measure   Help from another person eating meals?: A Little Help from another person taking care of personal grooming?: A Little Help from another person toileting, which includes using toliet, bedpan, or urinal?: A Little Help from another person bathing (including washing, rinsing, drying)?: A Lot Help from another person to put on and taking off regular upper body clothing?: A Lot Help from another person to put on and taking off regular lower body clothing?: A Lot 6 Click Score: 15    End of Session    OT Visit Diagnosis: Unsteadiness on feet (R26.81);Muscle weakness (generalized) (M62.81);Repeated falls (R29.6);Pain Pain - Right/Left: Left Pain - part of body: Arm   Activity Tolerance Patient tolerated treatment well   Patient Left in bed;with call bell/phone within reach;with bed alarm set   Nurse Communication Mobility status        Time: 8690-8678 OT Time Calculation (min): 12 min  Charges: OT General Charges $  OT Visit: 1 Visit OT Treatments $Therapeutic Exercise: 8-22 mins  Larraine Colas M.S. OTR/L  05/25/24, 2:34 PM

## 2024-05-25 NOTE — Plan of Care (Signed)

## 2024-05-26 DIAGNOSIS — E119 Type 2 diabetes mellitus without complications: Secondary | ICD-10-CM | POA: Diagnosis not present

## 2024-05-26 DIAGNOSIS — R296 Repeated falls: Secondary | ICD-10-CM | POA: Diagnosis not present

## 2024-05-26 DIAGNOSIS — L03115 Cellulitis of right lower limb: Secondary | ICD-10-CM | POA: Diagnosis not present

## 2024-05-26 DIAGNOSIS — G894 Chronic pain syndrome: Secondary | ICD-10-CM | POA: Diagnosis not present

## 2024-05-26 DIAGNOSIS — T796XXD Traumatic ischemia of muscle, subsequent encounter: Secondary | ICD-10-CM | POA: Diagnosis not present

## 2024-05-26 DIAGNOSIS — J449 Chronic obstructive pulmonary disease, unspecified: Secondary | ICD-10-CM | POA: Diagnosis not present

## 2024-05-26 DIAGNOSIS — D509 Iron deficiency anemia, unspecified: Secondary | ICD-10-CM | POA: Diagnosis not present

## 2024-05-26 DIAGNOSIS — S42302A Unspecified fracture of shaft of humerus, left arm, initial encounter for closed fracture: Secondary | ICD-10-CM | POA: Diagnosis not present

## 2024-05-29 DIAGNOSIS — S42302A Unspecified fracture of shaft of humerus, left arm, initial encounter for closed fracture: Secondary | ICD-10-CM | POA: Diagnosis not present

## 2024-05-29 DIAGNOSIS — T796XXD Traumatic ischemia of muscle, subsequent encounter: Secondary | ICD-10-CM | POA: Diagnosis not present

## 2024-05-29 DIAGNOSIS — D509 Iron deficiency anemia, unspecified: Secondary | ICD-10-CM | POA: Diagnosis not present

## 2024-05-29 DIAGNOSIS — J449 Chronic obstructive pulmonary disease, unspecified: Secondary | ICD-10-CM | POA: Diagnosis not present

## 2024-05-29 DIAGNOSIS — E119 Type 2 diabetes mellitus without complications: Secondary | ICD-10-CM | POA: Diagnosis not present

## 2024-05-29 DIAGNOSIS — I959 Hypotension, unspecified: Secondary | ICD-10-CM | POA: Diagnosis not present

## 2024-05-29 DIAGNOSIS — R296 Repeated falls: Secondary | ICD-10-CM | POA: Diagnosis not present

## 2024-05-29 DIAGNOSIS — G894 Chronic pain syndrome: Secondary | ICD-10-CM | POA: Diagnosis not present

## 2024-05-30 DIAGNOSIS — M6282 Rhabdomyolysis: Secondary | ICD-10-CM | POA: Diagnosis not present

## 2024-05-30 DIAGNOSIS — S42302A Unspecified fracture of shaft of humerus, left arm, initial encounter for closed fracture: Secondary | ICD-10-CM | POA: Diagnosis not present

## 2024-05-30 DIAGNOSIS — R234 Changes in skin texture: Secondary | ICD-10-CM | POA: Diagnosis not present

## 2024-05-30 DIAGNOSIS — R2689 Other abnormalities of gait and mobility: Secondary | ICD-10-CM | POA: Diagnosis not present

## 2024-05-30 DIAGNOSIS — E119 Type 2 diabetes mellitus without complications: Secondary | ICD-10-CM | POA: Diagnosis not present

## 2024-05-30 DIAGNOSIS — G894 Chronic pain syndrome: Secondary | ICD-10-CM | POA: Diagnosis not present

## 2024-05-30 DIAGNOSIS — R296 Repeated falls: Secondary | ICD-10-CM | POA: Diagnosis not present

## 2024-06-01 ENCOUNTER — Telehealth: Payer: Self-pay

## 2024-06-01 DIAGNOSIS — M6282 Rhabdomyolysis: Secondary | ICD-10-CM | POA: Diagnosis not present

## 2024-06-01 DIAGNOSIS — S42302A Unspecified fracture of shaft of humerus, left arm, initial encounter for closed fracture: Secondary | ICD-10-CM | POA: Diagnosis not present

## 2024-06-01 DIAGNOSIS — R296 Repeated falls: Secondary | ICD-10-CM | POA: Diagnosis not present

## 2024-06-01 DIAGNOSIS — L0390 Cellulitis, unspecified: Secondary | ICD-10-CM | POA: Diagnosis not present

## 2024-06-01 DIAGNOSIS — R234 Changes in skin texture: Secondary | ICD-10-CM | POA: Diagnosis not present

## 2024-06-01 DIAGNOSIS — N179 Acute kidney failure, unspecified: Secondary | ICD-10-CM | POA: Diagnosis not present

## 2024-06-01 DIAGNOSIS — R2689 Other abnormalities of gait and mobility: Secondary | ICD-10-CM | POA: Diagnosis not present

## 2024-06-01 DIAGNOSIS — E119 Type 2 diabetes mellitus without complications: Secondary | ICD-10-CM | POA: Diagnosis not present

## 2024-06-01 DIAGNOSIS — G894 Chronic pain syndrome: Secondary | ICD-10-CM | POA: Diagnosis not present

## 2024-06-01 DIAGNOSIS — I959 Hypotension, unspecified: Secondary | ICD-10-CM | POA: Diagnosis not present

## 2024-06-01 DIAGNOSIS — W19XXXA Unspecified fall, initial encounter: Secondary | ICD-10-CM | POA: Diagnosis not present

## 2024-06-01 NOTE — Progress Notes (Signed)
 Complex Care Management Care Guide Note  06/01/2024 Name: [name] MRN: [id number] DOB: Jun 23, 1951  Kim Graham is a 73 y.o. year old female who is a primary care patient of Vicci Duwaine SQUIBB, DO and is actively engaged with the care management team. I reached out to Kim Graham by phone today to assist with re-scheduling  with the RN Case Manager Licensed Clinical Social Worker.  Follow up plan: Unsuccessful telephone outreach attempt made. A HIPAA compliant phone message was left for the patient providing contact information and requesting a return call.  Jeoffrey Buffalo , RMA     [facility]  [facility] Guide  Direct Dial: [phone]  Website: [email or web address]

## 2024-06-05 DIAGNOSIS — L89026 Pressure-induced deep tissue damage of left elbow: Secondary | ICD-10-CM | POA: Diagnosis not present

## 2024-06-05 DIAGNOSIS — G894 Chronic pain syndrome: Secondary | ICD-10-CM | POA: Diagnosis not present

## 2024-06-05 DIAGNOSIS — L89154 Pressure ulcer of sacral region, stage 4: Secondary | ICD-10-CM | POA: Diagnosis not present

## 2024-06-05 DIAGNOSIS — S42342D Displaced spiral fracture of shaft of humerus, left arm, subsequent encounter for fracture with routine healing: Secondary | ICD-10-CM | POA: Diagnosis not present

## 2024-06-05 DIAGNOSIS — S42302A Unspecified fracture of shaft of humerus, left arm, initial encounter for closed fracture: Secondary | ICD-10-CM | POA: Diagnosis not present

## 2024-06-05 DIAGNOSIS — L03115 Cellulitis of right lower limb: Secondary | ICD-10-CM | POA: Diagnosis not present

## 2024-06-05 DIAGNOSIS — L03116 Cellulitis of left lower limb: Secondary | ICD-10-CM | POA: Diagnosis not present

## 2024-06-06 DIAGNOSIS — G894 Chronic pain syndrome: Secondary | ICD-10-CM | POA: Diagnosis not present

## 2024-06-06 DIAGNOSIS — E119 Type 2 diabetes mellitus without complications: Secondary | ICD-10-CM | POA: Diagnosis not present

## 2024-06-06 DIAGNOSIS — M6282 Rhabdomyolysis: Secondary | ICD-10-CM | POA: Diagnosis not present

## 2024-06-06 DIAGNOSIS — S42302A Unspecified fracture of shaft of humerus, left arm, initial encounter for closed fracture: Secondary | ICD-10-CM | POA: Diagnosis not present

## 2024-06-06 DIAGNOSIS — R2689 Other abnormalities of gait and mobility: Secondary | ICD-10-CM | POA: Diagnosis not present

## 2024-06-08 DIAGNOSIS — G894 Chronic pain syndrome: Secondary | ICD-10-CM | POA: Diagnosis not present

## 2024-06-08 DIAGNOSIS — J449 Chronic obstructive pulmonary disease, unspecified: Secondary | ICD-10-CM | POA: Diagnosis not present

## 2024-06-08 DIAGNOSIS — E119 Type 2 diabetes mellitus without complications: Secondary | ICD-10-CM | POA: Diagnosis not present

## 2024-06-08 DIAGNOSIS — R2689 Other abnormalities of gait and mobility: Secondary | ICD-10-CM | POA: Diagnosis not present

## 2024-06-08 DIAGNOSIS — S42302A Unspecified fracture of shaft of humerus, left arm, initial encounter for closed fracture: Secondary | ICD-10-CM | POA: Diagnosis not present

## 2024-06-08 DIAGNOSIS — M6282 Rhabdomyolysis: Secondary | ICD-10-CM | POA: Diagnosis not present

## 2024-06-09 ENCOUNTER — Encounter (HOSPITAL_COMMUNITY): Payer: Self-pay

## 2024-06-09 ENCOUNTER — Emergency Department

## 2024-06-09 ENCOUNTER — Emergency Department
Admission: EM | Admit: 2024-06-09 | Discharge: 2024-06-09 | Disposition: A | Discharge status: Skilled Nursing Facility | Attending: Emergency Medicine | Admitting: Emergency Medicine

## 2024-06-09 ENCOUNTER — Encounter: Payer: Self-pay | Admitting: Emergency Medicine

## 2024-06-09 ENCOUNTER — Other Ambulatory Visit: Payer: Self-pay

## 2024-06-09 DIAGNOSIS — S42352A Displaced comminuted fracture of shaft of humerus, left arm, initial encounter for closed fracture: Secondary | ICD-10-CM | POA: Diagnosis not present

## 2024-06-09 DIAGNOSIS — N189 Chronic kidney disease, unspecified: Secondary | ICD-10-CM | POA: Insufficient documentation

## 2024-06-09 DIAGNOSIS — S42295A Other nondisplaced fracture of upper end of left humerus, initial encounter for closed fracture: Secondary | ICD-10-CM | POA: Diagnosis not present

## 2024-06-09 DIAGNOSIS — T1490XA Injury, unspecified, initial encounter: Secondary | ICD-10-CM | POA: Diagnosis not present

## 2024-06-09 DIAGNOSIS — W19XXXD Unspecified fall, subsequent encounter: Secondary | ICD-10-CM | POA: Diagnosis not present

## 2024-06-09 DIAGNOSIS — G5632 Lesion of radial nerve, left upper limb: Secondary | ICD-10-CM | POA: Insufficient documentation

## 2024-06-09 DIAGNOSIS — J449 Chronic obstructive pulmonary disease, unspecified: Secondary | ICD-10-CM | POA: Insufficient documentation

## 2024-06-09 DIAGNOSIS — M85822 Other specified disorders of bone density and structure, left upper arm: Secondary | ICD-10-CM | POA: Diagnosis not present

## 2024-06-09 DIAGNOSIS — E1122 Type 2 diabetes mellitus with diabetic chronic kidney disease: Secondary | ICD-10-CM | POA: Insufficient documentation

## 2024-06-09 DIAGNOSIS — S4992XD Unspecified injury of left shoulder and upper arm, subsequent encounter: Secondary | ICD-10-CM | POA: Diagnosis present

## 2024-06-09 DIAGNOSIS — S42212A Unspecified displaced fracture of surgical neck of left humerus, initial encounter for closed fracture: Secondary | ICD-10-CM | POA: Diagnosis not present

## 2024-06-09 DIAGNOSIS — S42342A Displaced spiral fracture of shaft of humerus, left arm, initial encounter for closed fracture: Secondary | ICD-10-CM | POA: Diagnosis not present

## 2024-06-09 DIAGNOSIS — S42352G Displaced comminuted fracture of shaft of humerus, left arm, subsequent encounter for fracture with delayed healing: Secondary | ICD-10-CM | POA: Insufficient documentation

## 2024-06-09 DIAGNOSIS — M21332 Wrist drop, left wrist: Secondary | ICD-10-CM | POA: Diagnosis not present

## 2024-06-09 DIAGNOSIS — W19XXXA Unspecified fall, initial encounter: Secondary | ICD-10-CM | POA: Diagnosis not present

## 2024-06-09 LAB — CBC WITH DIFFERENTIAL/PLATELET
Abs Immature Granulocytes: 0.02 K/uL (ref 0.00–0.07)
Basophils Absolute: 0 K/uL (ref 0.0–0.1)
Basophils Relative: 0 %
Eosinophils Absolute: 0 K/uL (ref 0.0–0.5)
Eosinophils Relative: 1 %
HCT: 38.1 % (ref 36.0–46.0)
Hemoglobin: 11.3 g/dL — ABNORMAL LOW (ref 12.0–15.0)
Immature Granulocytes: 0 %
Lymphocytes Relative: 17 %
Lymphs Abs: 1 K/uL (ref 0.7–4.0)
MCH: 28 pg (ref 26.0–34.0)
MCHC: 29.7 g/dL — ABNORMAL LOW (ref 30.0–36.0)
MCV: 94.5 fL (ref 80.0–100.0)
Monocytes Absolute: 0.5 K/uL (ref 0.1–1.0)
Monocytes Relative: 8 %
Neutro Abs: 4.7 K/uL (ref 1.7–7.7)
Neutrophils Relative %: 74 %
Platelets: 175 K/uL (ref 150–400)
RBC: 4.03 MIL/uL (ref 3.87–5.11)
RDW: 15.1 % (ref 11.5–15.5)
WBC: 6.3 K/uL (ref 4.0–10.5)
nRBC: 0 % (ref 0.0–0.2)

## 2024-06-09 LAB — BASIC METABOLIC PANEL WITH GFR
Anion gap: 12 (ref 5–15)
BUN: 9 mg/dL (ref 8–23)
CO2: 27 mmol/L (ref 22–32)
Calcium: 9.1 mg/dL (ref 8.9–10.3)
Chloride: 103 mmol/L (ref 98–111)
Creatinine, Ser: 0.66 mg/dL (ref 0.44–1.00)
GFR, Estimated: >60 mL/min (ref >60)
Glucose, Bld: 100 mg/dL — ABNORMAL HIGH (ref 70–99)
Potassium: 3.4 mmol/L — ABNORMAL LOW (ref 3.5–5.1)
Sodium: 142 mmol/L (ref 135–145)

## 2024-06-09 NOTE — ED Notes (Signed)
 Called [facility] for possible transfer, per Dr. Willo

## 2024-06-09 NOTE — ED Notes (Signed)
 [facility] @ [facility] to repage ortho

## 2024-06-09 NOTE — ED Notes (Signed)
 Explained the wait  Pt states she just wants to go home

## 2024-06-09 NOTE — Discharge Instructions (Addendum)
 The fracture in your upper arm is causing issues with a nerve that makes it difficult for you to lift your wrist or open your hand.  You will need to follow-up with a specialist in [facility] for this to determine whether you will need surgery.  You should call the phone number listed in your paperwork today to schedule this appointment in the next 5 to 7 days.  Please return to the closest ER for reevaluation if you have worsening symptoms.

## 2024-06-09 NOTE — ED Notes (Signed)
 States she went to see ortho today  She was sent to due swelling and possible nerve damage  Presents with brace in place

## 2024-06-09 NOTE — Progress Notes (Signed)
 Chief Complaint: Chief Complaint  Patient presents with  . Follow-up    Closed displaced spiral fracture of shaft of left humerus    History of Present Illness Kim Graham is a 73 year old female who presents with a left arm fracture following a fall. She experienced a fall on April 20, 2024, after tripping over a stepping stone, resulting in a fracture of the left arm with significant bruising. She manages her pain with Advil  and Tylenol  as needed, but reports that this has not been well controlling her discomfort. She is not on any blood thinners.  During her previous visit with [facility] Amber on 05/02/2024, the patient was placed into a coaptation splint and a Sarmiento brace was ordered.  She presents today wearing the Sarmiento brace, however reports no improvement of her relative symptoms.  In fact, the patient states that she has had some difficulty with pain along the arm.  Also notably has a drop wrist deformity and is unable to extend her fingers.  She states that this seemed to happen after the injury.  Denies having any numbness or tingling sensations.   Past Medical History: Past Medical History:  Diagnosis Date  . COPD (chronic obstructive pulmonary disease) (CMS/HHS-HCC)     Past Surgical History: Past Surgical History:  Procedure Laterality Date  . CHOLECYSTECTOMY  12/09/2021   Dr Lucas Catchings -- ROBOTIC    Past Family History: Family History  Problem Relation Age of Onset  . Diabetes Mother   . Pancreatic cancer Father   . Diabetes Maternal Grandmother     Medications: Current Outpatient Medications  Medication Sig Dispense Refill  . albuterol  (PROVENTIL ) 2.5 mg /3 mL (0.083 %) nebulizer solution Take 2.5 mg by nebulization every 6 (six) hours as needed for Wheezing or Shortness of Breath    . albuterol  90 mcg/actuation inhaler Inhale into the lungs as needed    . albuterol  MDI, PROVENTIL , VENTOLIN , PROAIR , HFA 90 mcg/actuation inhaler Inhale 2 inhalations  into the lungs every 6 (six) hours as needed for Wheezing    . ARIPiprazole  (ABILIFY ) 20 MG tablet Take 20 mg by mouth once daily    . ARIPiprazole  (ABILIFY ) 30 MG tablet Take 30 mg by mouth once daily    . ascorbic acid , vitamin C , (VITAMIN C ) 500 MG tablet Take 500 mg by mouth once daily    . b complex vitamins capsule Take 1 capsule by mouth once daily    . conjugated estrogens  (PREMARIN ) 0.625 mg/gram vaginal cream Place vaginally once daily    . multivitamin tablet Take 1 tablet by mouth once daily    . multivitamin tablet Take 1 tablet by mouth once daily    . umeclidinium-vilanteroL (ANORO ELLIPTA ) 62.5-25 mcg/actuation inhaler Inhale into the lungs once daily    . zinc 50 mg Tab Take by mouth once daily     No current facility-administered medications for this visit.    Allergies: Allergies  Allergen Reactions  . Doxycycline  Diarrhea    Very bad diarrhea     Review of Systems:  A comprehensive 14 point ROS was performed, reviewed by me today, and the pertinent orthopaedic findings are documented in the HPI.   Exam: BP 128/70   Ht 162.6 cm (5' 4)   BMI 22.66 kg/m  General/Constitutional: The patient appears to be well-nourished, well-developed, and in no acute distress. Neuro/Psych: Normal mood and affect, oriented to person, place and time. Eyes: Non-icteric.  Pupils are equal, round, and reactive to light,  and exhibit synchronous movement. ENT: Unremarkable. Lymphatic: No palpable adenopathy. Respiratory: Non-labored breathing Cardiovascular: No edema, swelling or tenderness, except as noted in detailed exam. Integumentary: No impressive skin lesions present, except as noted in detailed exam. Musculoskeletal: Unremarkable, except as noted in detailed exam.  Left arm exam: Upon inspection of the patient's left arm, there does appear to be a relative mild deformity appreciated across the humerus with some underlying swelling and some partially resolving ecchymosis  extending down into the patient's elbow.  Patient does report having some notable tenderness along these margins.  Of note, patient also noted to have a left drop wrist, and is unable to extend her wrist or her pinky ring or middle finger, and appears to be slightly contracted.  According to the patient, she had a normal functioning hand prior to this injury.  She is able to engage with very gentle flexion and extension at the elbow and shoulder, but does still report having some minimal discomfort with this.  Reports that she has intact sensation extending down her arm and into her hand, but there is some concern given her relative drop wrist deformity during today's visit.  Radial pulses appreciated, 2+.  Cap refill less than 2 seconds in each finger.  Imaging: A series of x-rays were ordered and interpreted of the patient's left humerus.  These images included AP and lateral views.  Upon inspection of the patient's left humerus, there is a noticeable spiral fracture extending from the mid humeral shaft extending laterally and up the margins to the head of the humerus.  There does appear to be some comminution to the humeral head with a transverse fracture extending across the base of the surgical neck and over towards the greater tubercle with minimal displacement.  Approximately 15 degrees of angulation appreciated at this fracture segment.  Early callus formation present.  No lytic lesions or gross deformities appreciated on films.  Impression: Closed displaced spiral fracture of shaft of left humerus, initial encounter [S42.342A] Closed displaced spiral fracture of shaft of left humerus, initial encounter  (primary encounter diagnosis) Wrist drop, acquired, left  Plan:  A series of x-rays were ordered interpreted of the patient's left shoulder.  The images and findings were discussed with the patient Given the patient's current symptoms and evaluation, there is potential concern for a drop wrist  deformity secondary to potential radial nerve palsy secondary to injury Patient also has a notable spiral fracture deformity that will not heal most likely very well without surgical intervention After discussion with various surgeons in clinic, decided that it may be best to have this patient follow-up with an orthopedic trauma specialist.  Given the findings of a drop wrist, there is some potential concerns that this needs to be done sooner rather than later. Patient has difficulty receiving or finding any transportation given that she lives at an assisted living facility.  Discussed that it would be best to have the patient sent to [facility] or [facility] emergency care to be evaluated and have an orthopedic trauma evaluation done at these institutions. Referral has been placed and a note has been outlined stating that this is what the patient requires as documented above.  Patient will plan to follow-up with orthopedic trauma  This note was generated in part with voice recognition software and I apologize for any typographical errors that were not detected and corrected.   Fonda CHARLENA Gravel, PA-C [facility]

## 2024-06-09 NOTE — ED Notes (Signed)
 [facility] called for possible transfer per Dr. Willo

## 2024-06-09 NOTE — ED Notes (Signed)
 [facility] Called for Transfer per Dr. Willo , spoke with [facility]

## 2024-06-09 NOTE — ED Provider Notes (Signed)
 [facility] Provider Note    Event Date/Time   First MD Initiated Contact with Patient 06/09/24 1107     (approximate)   History   Chief Complaint Arm Injury   HPI  Kim Graham is a 73 y.o. female with past medical history of diabetes, CKD, COPD, and chronic pain syndrome who presents to the ED complaining of arm injury.  Patient reports that she fell on June 12 and was found to have fracture of her humeral shaft.  She was placed in coaptation splint at that time and plan was for follow-up as an outpatient with orthopedic trauma.  This has not yet happened and patient had an additional fall on July 9, found to be in rhabdomyolysis at that time and admitted to the hospital, at which point orthopedics placed her in a coaptation brace.  She has been residing at rehab facility since then, had follow-up in the local orthopedic clinic today.  She states that since early July, she has had difficulty using her left hand due to weakness in the hand.  She denies any numbness, has had some swelling to the hand, but denies significant pain to the hand.  She does have ongoing pain in her left upper arm.  After being seen by orthopedics today, there was concern for radial nerve palsy and she was referred to the ED.     Physical Exam   Triage Vital Signs: ED Triage Vitals [06/09/24 1109]  Encounter Vitals Group     BP      Girls Systolic BP Percentile      Girls Diastolic BP Percentile      Boys Systolic BP Percentile      Boys Diastolic BP Percentile      Pulse      Resp      Temp      Temp src      SpO2      Weight 121 lb 0.5 oz (54.9 kg)     Height 5' 4 (1.626 m)     Head Circumference      Peak Flow      Pain Score      Pain Loc      Pain Education      Exclude from Growth Chart     Most recent vital signs: Vitals:   06/09/24 1111  BP: (!) 120/55  Pulse: 71  Resp: 18  Temp: 98.1 F (36.7 C)  SpO2: 94%    Constitutional: Alert and  oriented. Eyes: Conjunctivae are normal. Head: Atraumatic. Nose: No congestion/rhinnorhea. Mouth/Throat: Mucous membranes are moist.  Cardiovascular: Normal rate, regular rhythm. Grossly normal heart sounds.  2+ radial pulses bilaterally.  Cap refill less than 2 seconds throughout digits of left hand. Respiratory: Normal respiratory effort.  No retractions. Lungs CTAB. Gastrointestinal: Soft and nontender. No distention. Musculoskeletal: Coaptation brace to left upper extremity.  Edema noted to left forearm, wrist, and hand with no erythema or tenderness.  Range of motion intact to left wrist and elbow without pain. no lower extremity tenderness nor edema.  Neurologic:  Normal speech and language.  Patient unable to extend left wrist or fingers, sensation intact throughout hand.  Strength intact at left elbow.    ED Results / Procedures / Treatments   Labs (all labs ordered are listed, but only abnormal results are displayed) Labs Reviewed  CBC WITH DIFFERENTIAL/PLATELET - Abnormal; Notable for the following components:      Result Value   Hemoglobin  11.3 (*)    MCHC 29.7 (*)    All other components within normal limits  BASIC METABOLIC PANEL WITH GFR - Abnormal; Notable for the following components:   Potassium 3.4 (*)    Glucose, Bld 100 (*)    All other components within normal limits    RADIOLOGY Left humeral x-ray reviewed and interpreted by me with midshaft fracture and significant displacement.  PROCEDURES:  Critical Care performed: No  Procedures   MEDICATIONS ORDERED IN ED: Medications - No data to display   IMPRESSION / MDM / ASSESSMENT AND PLAN / ED COURSE  I reviewed the triage vital signs and the nursing notes.                              73 y.o. female with past medical history of diabetes, CKD, COPD, and chronic pain syndrome who presents to the ED complaining of difficulty using her left hand after sustaining fracture to her humerus about a month and a  half ago.  Patient's presentation is most consistent with acute presentation with potential threat to life or bodily function.  Differential diagnosis includes, but is not limited to, fracture, dislocation, nerve injury, vascular insufficiency, infection.  Patient nontoxic-appearing and in no acute distress, vital signs are unremarkable.  She has good blood flow to her left hand with intact radial pulse and cap refill less than 2 seconds, but she is unable to lift her wrist at all or extend her fingers outwards.  Presentation concerning for radial nerve palsy with known humeral shaft fracture.  Case discussed with Dr. Edie of orthopedics, who recommends transfer to trauma center for orthopedic surgery evaluation.  Patient requests transfer to [facility], will reach out to transfer center.  [facility] transfer center stated they were at capacity and unable to accept transfer.  I then spoke with Dr. Jerri of orthopedics at [facility], who stated that patient did not need to be emergently transferred and may follow-up with Dr. Genelle of the trauma team in the next 5 to 7 days.  I reviewed this plan with Dr. Edie of orthopedics here at [facility], who also agrees that follow-up with Dr. Genelle is reasonable.  Patient's pain remains well-controlled on reassessment and I emphasized the need for her to follow-up with Ortho trauma in the next week.  She was counseled to return to the ED for new or worsening symptoms, patient agrees with plan.      FINAL CLINICAL IMPRESSION(S) / ED DIAGNOSES   Final diagnoses:  Closed displaced comminuted fracture of shaft of left humerus with delayed healing, subsequent encounter  Radial nerve dysfunction, left     Rx / DC Orders   ED Discharge Orders     None        Note:  This document was prepared using Dragon voice recognition software and may include unintentional dictation errors.   Willo Dunnings, MD 06/09/24 [phone]

## 2024-06-09 NOTE — ED Triage Notes (Signed)
 Presents via EMS from [facility] Was seen at [facility] for arm fx  Was told that she needed to f/u urgently for surgery

## 2024-06-12 DIAGNOSIS — L89154 Pressure ulcer of sacral region, stage 4: Secondary | ICD-10-CM | POA: Diagnosis not present

## 2024-06-12 DIAGNOSIS — L03116 Cellulitis of left lower limb: Secondary | ICD-10-CM | POA: Diagnosis not present

## 2024-06-12 DIAGNOSIS — G894 Chronic pain syndrome: Secondary | ICD-10-CM | POA: Diagnosis not present

## 2024-06-12 DIAGNOSIS — N390 Urinary tract infection, site not specified: Secondary | ICD-10-CM | POA: Diagnosis not present

## 2024-06-12 DIAGNOSIS — L03115 Cellulitis of right lower limb: Secondary | ICD-10-CM | POA: Diagnosis not present

## 2024-06-12 DIAGNOSIS — S42342D Displaced spiral fracture of shaft of humerus, left arm, subsequent encounter for fracture with routine healing: Secondary | ICD-10-CM | POA: Diagnosis not present

## 2024-06-12 DIAGNOSIS — S42302A Unspecified fracture of shaft of humerus, left arm, initial encounter for closed fracture: Secondary | ICD-10-CM | POA: Diagnosis not present

## 2024-06-13 DIAGNOSIS — L89154 Pressure ulcer of sacral region, stage 4: Secondary | ICD-10-CM | POA: Diagnosis not present

## 2024-06-13 DIAGNOSIS — L03115 Cellulitis of right lower limb: Secondary | ICD-10-CM | POA: Diagnosis not present

## 2024-06-13 DIAGNOSIS — R109 Unspecified abdominal pain: Secondary | ICD-10-CM | POA: Diagnosis not present

## 2024-06-13 DIAGNOSIS — N390 Urinary tract infection, site not specified: Secondary | ICD-10-CM | POA: Diagnosis not present

## 2024-06-13 DIAGNOSIS — G894 Chronic pain syndrome: Secondary | ICD-10-CM | POA: Diagnosis not present

## 2024-06-13 DIAGNOSIS — S42302A Unspecified fracture of shaft of humerus, left arm, initial encounter for closed fracture: Secondary | ICD-10-CM | POA: Diagnosis not present

## 2024-06-14 DIAGNOSIS — L03115 Cellulitis of right lower limb: Secondary | ICD-10-CM | POA: Diagnosis not present

## 2024-06-14 DIAGNOSIS — G894 Chronic pain syndrome: Secondary | ICD-10-CM | POA: Diagnosis not present

## 2024-06-14 DIAGNOSIS — L89154 Pressure ulcer of sacral region, stage 4: Secondary | ICD-10-CM | POA: Diagnosis not present

## 2024-06-14 DIAGNOSIS — N390 Urinary tract infection, site not specified: Secondary | ICD-10-CM | POA: Diagnosis not present

## 2024-06-15 DIAGNOSIS — R2689 Other abnormalities of gait and mobility: Secondary | ICD-10-CM | POA: Diagnosis not present

## 2024-06-15 DIAGNOSIS — M6282 Rhabdomyolysis: Secondary | ICD-10-CM | POA: Diagnosis not present

## 2024-06-15 DIAGNOSIS — E119 Type 2 diabetes mellitus without complications: Secondary | ICD-10-CM | POA: Diagnosis not present

## 2024-06-16 ENCOUNTER — Ambulatory Visit (HOSPITAL_BASED_OUTPATIENT_CLINIC_OR_DEPARTMENT_OTHER): Admitting: Orthopaedic Surgery

## 2024-06-16 DIAGNOSIS — L0390 Cellulitis, unspecified: Secondary | ICD-10-CM | POA: Diagnosis not present

## 2024-06-16 DIAGNOSIS — N390 Urinary tract infection, site not specified: Secondary | ICD-10-CM | POA: Diagnosis not present

## 2024-06-16 DIAGNOSIS — L89154 Pressure ulcer of sacral region, stage 4: Secondary | ICD-10-CM | POA: Diagnosis not present

## 2024-06-16 DIAGNOSIS — G894 Chronic pain syndrome: Secondary | ICD-10-CM | POA: Diagnosis not present

## 2024-06-19 ENCOUNTER — Ambulatory Visit (HOSPITAL_BASED_OUTPATIENT_CLINIC_OR_DEPARTMENT_OTHER): Admitting: Physician Assistant

## 2024-06-19 DIAGNOSIS — L89316 Pressure-induced deep tissue damage of right buttock: Secondary | ICD-10-CM | POA: Diagnosis not present

## 2024-06-19 DIAGNOSIS — L03116 Cellulitis of left lower limb: Secondary | ICD-10-CM | POA: Diagnosis not present

## 2024-06-19 DIAGNOSIS — L03115 Cellulitis of right lower limb: Secondary | ICD-10-CM | POA: Diagnosis not present

## 2024-06-19 DIAGNOSIS — L89154 Pressure ulcer of sacral region, stage 4: Secondary | ICD-10-CM | POA: Diagnosis not present

## 2024-06-19 DIAGNOSIS — G894 Chronic pain syndrome: Secondary | ICD-10-CM | POA: Diagnosis not present

## 2024-06-19 DIAGNOSIS — R600 Localized edema: Secondary | ICD-10-CM | POA: Diagnosis not present

## 2024-06-19 DIAGNOSIS — J449 Chronic obstructive pulmonary disease, unspecified: Secondary | ICD-10-CM | POA: Diagnosis not present

## 2024-06-20 DIAGNOSIS — R600 Localized edema: Secondary | ICD-10-CM | POA: Diagnosis not present

## 2024-06-20 DIAGNOSIS — L89154 Pressure ulcer of sacral region, stage 4: Secondary | ICD-10-CM | POA: Diagnosis not present

## 2024-06-20 DIAGNOSIS — L03115 Cellulitis of right lower limb: Secondary | ICD-10-CM | POA: Diagnosis not present

## 2024-06-20 DIAGNOSIS — R2689 Other abnormalities of gait and mobility: Secondary | ICD-10-CM | POA: Diagnosis not present

## 2024-06-20 DIAGNOSIS — E119 Type 2 diabetes mellitus without complications: Secondary | ICD-10-CM | POA: Diagnosis not present

## 2024-06-20 DIAGNOSIS — M6282 Rhabdomyolysis: Secondary | ICD-10-CM | POA: Diagnosis not present

## 2024-06-20 DIAGNOSIS — G894 Chronic pain syndrome: Secondary | ICD-10-CM | POA: Diagnosis not present

## 2024-06-20 DIAGNOSIS — S42302A Unspecified fracture of shaft of humerus, left arm, initial encounter for closed fracture: Secondary | ICD-10-CM | POA: Diagnosis not present

## 2024-06-21 DIAGNOSIS — L03115 Cellulitis of right lower limb: Secondary | ICD-10-CM | POA: Diagnosis not present

## 2024-06-21 DIAGNOSIS — G894 Chronic pain syndrome: Secondary | ICD-10-CM | POA: Diagnosis not present

## 2024-06-21 DIAGNOSIS — R600 Localized edema: Secondary | ICD-10-CM | POA: Diagnosis not present

## 2024-06-22 ENCOUNTER — Ambulatory Visit (INDEPENDENT_AMBULATORY_CARE_PROVIDER_SITE_OTHER): Admitting: Physician Assistant

## 2024-06-22 ENCOUNTER — Ambulatory Visit (HOSPITAL_BASED_OUTPATIENT_CLINIC_OR_DEPARTMENT_OTHER)

## 2024-06-22 ENCOUNTER — Encounter (HOSPITAL_BASED_OUTPATIENT_CLINIC_OR_DEPARTMENT_OTHER): Payer: Self-pay | Admitting: Physician Assistant

## 2024-06-22 DIAGNOSIS — L03115 Cellulitis of right lower limb: Secondary | ICD-10-CM | POA: Diagnosis not present

## 2024-06-22 DIAGNOSIS — R600 Localized edema: Secondary | ICD-10-CM | POA: Diagnosis not present

## 2024-06-22 DIAGNOSIS — E119 Type 2 diabetes mellitus without complications: Secondary | ICD-10-CM | POA: Diagnosis not present

## 2024-06-22 DIAGNOSIS — R2689 Other abnormalities of gait and mobility: Secondary | ICD-10-CM | POA: Diagnosis not present

## 2024-06-22 DIAGNOSIS — M6282 Rhabdomyolysis: Secondary | ICD-10-CM | POA: Diagnosis not present

## 2024-06-22 DIAGNOSIS — S42342A Displaced spiral fracture of shaft of humerus, left arm, initial encounter for closed fracture: Secondary | ICD-10-CM

## 2024-06-22 NOTE — Progress Notes (Signed)
 Office Visit Note   Patient: Kim Graham Vital           Date of Birth: 11-Mar-1951           MRN: [id number] Visit Date: 06/22/2024              Requested by: Vicci Duwaine SQUIBB, DO [address] [place],  [place] [address] PCP: Vicci Duwaine SQUIBB, DO   Assessment & Plan: Visit Diagnoses:  1. Closed displaced spiral fracture of shaft of left humerus, initial encounter     Plan: Patient is a 73 year old woman who is now 2-1/2 months status post fall onto her left arm.  At that time she sustained a humeral shaft fracture.  Dr. Tobie was consulted and the patient was placed in a splint and told to follow-up the next week.  Unknown as to me this did not occur.  Apparently she also had a fall after that and sustained a humeral neck fracture.  She was followed by the [facility] clinic who saw her 2 weeks ago.  She has a radial nerve palsy and is in a coapt of splint.  They recommended and were to place a referral to orthopedic trauma.  She presents here today.  I placed a stat referral to orthopedic trauma specialist.  At this far out I am not sure how much radial nerve function she could regain.  She originally was independent living but has had several falls and is now in a nursing facility  Follow-Up Instructions: Return if symptoms worsen or fail to improve.  Referral placed to orthopedic trauma  Orders:  No orders of the defined types were placed in this encounter.  No orders of the defined types were placed in this encounter.     Procedures: No procedures performed   Clinical Data: No additional findings.   Subjective: Chief Complaint  Patient presents with   Left Upper Arm - Pain    HPI patient is status post 2 falls since June sustaining a humeral head as well as a humeral shaft fracture.  Humeral shaft was in June.  She was placed in a splint and told to follow-up in orthopedic clinic a week later.  She did go her last visit was with the [place] clinic a couple weeks ago where it was said  that they would refer her to a trauma orthopedist.  Unfortunately appointment was made here.  Review of Systems  All other systems reviewed and are negative.    Objective: Vital Signs: There were no vitals taken for this visit.  Physical Exam Constitutional:      Appearance: Normal appearance.  Pulmonary:     Effort: Pulmonary effort is normal.     Breath sounds: Normal breath sounds.  Skin:    General: Skin is warm and dry.  Neurological:     General: No focal deficit present.     Mental Status: She is alert and oriented to person, place, and time.  Psychiatric:        Mood and Affect: Mood normal.        Behavior: Behavior normal.     Ortho Exam She is in her splint she has good capillary refill.  She has swelling dependent in her hand but no cellulitis she has a positive radial nerve palsy in her left hand No specialty comments available.  Imaging: No results found.   PMFS History: Patient Active Problem List   Diagnosis Date Noted   Rhabdomyolysis, traumatic 05/17/2024  Hypokalemia 05/17/2024   Cellulitis of right lower extremity 05/17/2024   Fall at home, initial encounter 05/17/2024   Hypomagnesemia 05/17/2024   Elevated troponin 05/17/2024   History of fracture of humerus 04/20/24 05/17/2024   Closed displaced spiral fracture of shaft of left humerus 05/17/2024   Chronic venous insufficiency 06/13/2023   Thrombocytopenia (HCC) 05/21/2022   Chronic kidney disease, stage 3a (HCC) 05/20/2022   Nicotine dependence 05/19/2022   Ureteral calculus, left    Hydronephrosis of left kidney    Transaminitis 02/07/2022   IFG (impaired fasting glucose) 10/14/2021   Methadone  dependence (HCC) 04/04/2021   Hep C w/o coma, chronic (HCC) 07/11/2020   Diabetes mellitus type 2, diet-controlled (HCC) 04/15/2018   Depression, recurrent (HCC) 03/18/2018   Advance directive discussed with patient 06/18/2017   Chronic obstructive pulmonary disease (HCC) 06/16/2017   Past  Medical History:  Diagnosis Date   Acute cholecystitis 12/09/2021   Bile leak    Chronic kidney disease, stage 3a (HCC)    Chronic venous insufficiency    COPD (chronic obstructive pulmonary disease) (HCC)    Depression    Hep C w/o coma, chronic (HCC)    treated   History of kidney stones    Hypertension    Methadone  dependence (HCC)    Nephrolithiasis    Pre-diabetes    Sepsis secondary to UTI (HCC)    Thrombocytopenia (HCC)     Family History  Problem Relation Age of Onset   Diabetes Mother    Cancer Father        Pancreatic   Diabetes Maternal Aunt    Diabetes Maternal Grandmother    Dementia Maternal Grandfather    Heart disease Maternal Grandfather     Past Surgical History:  Procedure Laterality Date   CYSTOSCOPY W/ URETERAL STENT PLACEMENT Right 12/09/2021   Procedure: CYSTOSCOPY WITH RETROGRADE PYELOGRAM/URETERAL STENT PLACEMENT;  Surgeon: Twylla Glendia BROCKS, MD;  Location: [facility] ORS;  Service: Urology;  Laterality: Right;   CYSTOSCOPY/URETEROSCOPY/HOLMIUM LASER/STENT PLACEMENT Bilateral 02/11/2022   Procedure: CYSTOSCOPY/URETEROSCOPY/HOLMIUM LASER/STENT PLACEMENT & RIGHT URETERAL STENT EXCHANGE;  Surgeon: Twylla Glendia BROCKS, MD;  Location: [facility] ORS;  Service: Urology;  Laterality: Bilateral;   CYSTOSCOPY/URETEROSCOPY/HOLMIUM LASER/STENT PLACEMENT Bilateral 05/19/2022   Procedure: CYSTOSCOPY/URETEROSCOPY/HOLMIUM LASER/STENT PLACEMENT;  Surgeon: Twylla Glendia BROCKS, MD;  Location: [facility];  Service: Urology;  Laterality: Bilateral;   ENDOSCOPIC RETROGRADE CHOLANGIOPANCREATOGRAPHY (ERCP) WITH PROPOFOL  N/A 12/11/2021   Procedure: ENDOSCOPIC RETROGRADE CHOLANGIOPANCREATOGRAPHY (ERCP) WITH PROPOFOL ;  Surgeon: Jinny Carmine, MD;  Location: [facility] ENDOSCOPY;  Service: Endoscopy;  Laterality: N/A;   ERCP N/A 03/10/2022   Procedure: ENDOSCOPIC RETROGRADE CHOLANGIOPANCREATOGRAPHY (ERCP);  Surgeon: Jinny Carmine, MD;  Location: [facility] ENDOSCOPY;  Service: Endoscopy;  Laterality: N/A;  Stent  removal   ERCP N/A 06/23/2022   Procedure: ENDOSCOPIC RETROGRADE CHOLANGIOPANCREATOGRAPHY (ERCP);  Surgeon: Jinny Carmine, MD;  Location: [facility] ENDOSCOPY;  Service: Endoscopy;  Laterality: N/A;  stent removal   PILONIDAL CYST EXCISION N/A 12/29/2023   Procedure: CYST EXCISION PILONIDAL EXTENSIVE;  Surgeon: Marinda Jayson KIDD, MD;  Location: [facility] ORS;  Service: General;  Laterality: N/A;   PLACEMENT OF BREAST IMPLANTS Bilateral    TONSILLECTOMY     Social History   Occupational History   Not on file  Tobacco Use   Smoking status: Former    Current packs/day: 0.00    Average packs/day: 0.5 packs/day for 49.0 years (24.5 ttl pk-yrs)    Types: Cigarettes    Start date: 12/13/1969    Quit date: 12/13/2018    Years since  quitting: 5.5    Passive exposure: Past   Smokeless tobacco: Never  Vaping Use   Vaping status: Never Used  Substance and Sexual Activity   Alcohol use: Not Currently    Comment: On occasion   Drug use: Not Currently    Types: Heroin    Comment: revovering addict   Sexual activity: Not Currently

## 2024-06-23 DIAGNOSIS — L89154 Pressure ulcer of sacral region, stage 4: Secondary | ICD-10-CM | POA: Diagnosis not present

## 2024-06-23 DIAGNOSIS — L03115 Cellulitis of right lower limb: Secondary | ICD-10-CM | POA: Diagnosis not present

## 2024-06-23 DIAGNOSIS — G894 Chronic pain syndrome: Secondary | ICD-10-CM | POA: Diagnosis not present

## 2024-06-23 DIAGNOSIS — R600 Localized edema: Secondary | ICD-10-CM | POA: Diagnosis not present

## 2024-06-24 DIAGNOSIS — I10 Essential (primary) hypertension: Secondary | ICD-10-CM | POA: Diagnosis not present

## 2024-06-26 DIAGNOSIS — L03115 Cellulitis of right lower limb: Secondary | ICD-10-CM | POA: Diagnosis not present

## 2024-06-26 DIAGNOSIS — L89154 Pressure ulcer of sacral region, stage 4: Secondary | ICD-10-CM | POA: Diagnosis not present

## 2024-06-26 DIAGNOSIS — L89316 Pressure-induced deep tissue damage of right buttock: Secondary | ICD-10-CM | POA: Diagnosis not present

## 2024-06-26 DIAGNOSIS — L03116 Cellulitis of left lower limb: Secondary | ICD-10-CM | POA: Diagnosis not present

## 2024-06-27 DIAGNOSIS — L308 Other specified dermatitis: Secondary | ICD-10-CM | POA: Diagnosis not present

## 2024-06-27 DIAGNOSIS — G894 Chronic pain syndrome: Secondary | ICD-10-CM | POA: Diagnosis not present

## 2024-06-27 DIAGNOSIS — E119 Type 2 diabetes mellitus without complications: Secondary | ICD-10-CM | POA: Diagnosis not present

## 2024-06-27 DIAGNOSIS — M6282 Rhabdomyolysis: Secondary | ICD-10-CM | POA: Diagnosis not present

## 2024-06-27 DIAGNOSIS — R2689 Other abnormalities of gait and mobility: Secondary | ICD-10-CM | POA: Diagnosis not present

## 2024-06-28 DIAGNOSIS — M24542 Contracture, left hand: Secondary | ICD-10-CM | POA: Diagnosis not present

## 2024-06-28 DIAGNOSIS — S4422XA Injury of radial nerve at upper arm level, left arm, initial encounter: Secondary | ICD-10-CM | POA: Diagnosis not present

## 2024-06-28 DIAGNOSIS — S42202A Unspecified fracture of upper end of left humerus, initial encounter for closed fracture: Secondary | ICD-10-CM | POA: Diagnosis not present

## 2024-06-29 DIAGNOSIS — E119 Type 2 diabetes mellitus without complications: Secondary | ICD-10-CM | POA: Diagnosis not present

## 2024-06-29 DIAGNOSIS — M6282 Rhabdomyolysis: Secondary | ICD-10-CM | POA: Diagnosis not present

## 2024-06-29 DIAGNOSIS — R2689 Other abnormalities of gait and mobility: Secondary | ICD-10-CM | POA: Diagnosis not present

## 2024-06-30 DIAGNOSIS — G894 Chronic pain syndrome: Secondary | ICD-10-CM | POA: Diagnosis not present

## 2024-06-30 DIAGNOSIS — J449 Chronic obstructive pulmonary disease, unspecified: Secondary | ICD-10-CM | POA: Diagnosis not present

## 2024-06-30 DIAGNOSIS — I959 Hypotension, unspecified: Secondary | ICD-10-CM | POA: Diagnosis not present

## 2024-06-30 DIAGNOSIS — R600 Localized edema: Secondary | ICD-10-CM | POA: Diagnosis not present

## 2024-06-30 DIAGNOSIS — D509 Iron deficiency anemia, unspecified: Secondary | ICD-10-CM | POA: Diagnosis not present

## 2024-06-30 DIAGNOSIS — L89154 Pressure ulcer of sacral region, stage 4: Secondary | ICD-10-CM | POA: Diagnosis not present

## 2024-07-03 DIAGNOSIS — L03116 Cellulitis of left lower limb: Secondary | ICD-10-CM | POA: Diagnosis not present

## 2024-07-03 DIAGNOSIS — L03115 Cellulitis of right lower limb: Secondary | ICD-10-CM | POA: Diagnosis not present

## 2024-07-03 DIAGNOSIS — L89154 Pressure ulcer of sacral region, stage 4: Secondary | ICD-10-CM | POA: Diagnosis not present

## 2024-07-03 DIAGNOSIS — G894 Chronic pain syndrome: Secondary | ICD-10-CM | POA: Diagnosis not present

## 2024-07-03 DIAGNOSIS — L89316 Pressure-induced deep tissue damage of right buttock: Secondary | ICD-10-CM | POA: Diagnosis not present

## 2024-07-04 DIAGNOSIS — M6282 Rhabdomyolysis: Secondary | ICD-10-CM | POA: Diagnosis not present

## 2024-07-04 DIAGNOSIS — E119 Type 2 diabetes mellitus without complications: Secondary | ICD-10-CM | POA: Diagnosis not present

## 2024-07-04 DIAGNOSIS — R2689 Other abnormalities of gait and mobility: Secondary | ICD-10-CM | POA: Diagnosis not present

## 2024-07-06 DIAGNOSIS — I739 Peripheral vascular disease, unspecified: Secondary | ICD-10-CM | POA: Diagnosis not present

## 2024-07-06 DIAGNOSIS — G894 Chronic pain syndrome: Secondary | ICD-10-CM | POA: Diagnosis not present

## 2024-07-06 DIAGNOSIS — R600 Localized edema: Secondary | ICD-10-CM | POA: Diagnosis not present

## 2024-07-07 DIAGNOSIS — J449 Chronic obstructive pulmonary disease, unspecified: Secondary | ICD-10-CM | POA: Diagnosis not present

## 2024-07-07 DIAGNOSIS — I959 Hypotension, unspecified: Secondary | ICD-10-CM | POA: Diagnosis not present

## 2024-07-07 DIAGNOSIS — I739 Peripheral vascular disease, unspecified: Secondary | ICD-10-CM | POA: Diagnosis not present

## 2024-07-07 DIAGNOSIS — G894 Chronic pain syndrome: Secondary | ICD-10-CM | POA: Diagnosis not present

## 2024-07-07 DIAGNOSIS — D509 Iron deficiency anemia, unspecified: Secondary | ICD-10-CM | POA: Diagnosis not present

## 2024-07-11 DIAGNOSIS — G894 Chronic pain syndrome: Secondary | ICD-10-CM | POA: Diagnosis not present

## 2024-07-11 DIAGNOSIS — J449 Chronic obstructive pulmonary disease, unspecified: Secondary | ICD-10-CM | POA: Diagnosis not present

## 2024-07-11 DIAGNOSIS — I739 Peripheral vascular disease, unspecified: Secondary | ICD-10-CM | POA: Diagnosis not present

## 2024-07-11 DIAGNOSIS — I959 Hypotension, unspecified: Secondary | ICD-10-CM | POA: Diagnosis not present

## 2024-07-12 DIAGNOSIS — L03116 Cellulitis of left lower limb: Secondary | ICD-10-CM | POA: Diagnosis not present

## 2024-07-12 DIAGNOSIS — L89316 Pressure-induced deep tissue damage of right buttock: Secondary | ICD-10-CM | POA: Diagnosis not present

## 2024-07-12 DIAGNOSIS — L89154 Pressure ulcer of sacral region, stage 4: Secondary | ICD-10-CM | POA: Diagnosis not present

## 2024-07-12 DIAGNOSIS — L03115 Cellulitis of right lower limb: Secondary | ICD-10-CM | POA: Diagnosis not present

## 2024-07-17 DIAGNOSIS — L03116 Cellulitis of left lower limb: Secondary | ICD-10-CM | POA: Diagnosis not present

## 2024-07-17 DIAGNOSIS — L03115 Cellulitis of right lower limb: Secondary | ICD-10-CM | POA: Diagnosis not present

## 2024-07-17 DIAGNOSIS — L89316 Pressure-induced deep tissue damage of right buttock: Secondary | ICD-10-CM | POA: Diagnosis not present

## 2024-07-17 DIAGNOSIS — L89154 Pressure ulcer of sacral region, stage 4: Secondary | ICD-10-CM | POA: Diagnosis not present

## 2024-07-20 DIAGNOSIS — M6282 Rhabdomyolysis: Secondary | ICD-10-CM | POA: Diagnosis not present

## 2024-07-20 DIAGNOSIS — R2689 Other abnormalities of gait and mobility: Secondary | ICD-10-CM | POA: Diagnosis not present

## 2024-07-20 DIAGNOSIS — E119 Type 2 diabetes mellitus without complications: Secondary | ICD-10-CM | POA: Diagnosis not present

## 2024-07-21 DIAGNOSIS — L89310 Pressure ulcer of right buttock, unstageable: Secondary | ICD-10-CM | POA: Diagnosis not present

## 2024-07-21 DIAGNOSIS — E119 Type 2 diabetes mellitus without complications: Secondary | ICD-10-CM | POA: Diagnosis not present

## 2024-07-21 DIAGNOSIS — J449 Chronic obstructive pulmonary disease, unspecified: Secondary | ICD-10-CM | POA: Diagnosis not present

## 2024-07-21 DIAGNOSIS — L89154 Pressure ulcer of sacral region, stage 4: Secondary | ICD-10-CM | POA: Diagnosis not present

## 2024-07-21 DIAGNOSIS — G894 Chronic pain syndrome: Secondary | ICD-10-CM | POA: Diagnosis not present

## 2024-07-21 DIAGNOSIS — I739 Peripheral vascular disease, unspecified: Secondary | ICD-10-CM | POA: Diagnosis not present

## 2024-07-21 DIAGNOSIS — N179 Acute kidney failure, unspecified: Secondary | ICD-10-CM | POA: Diagnosis not present

## 2024-07-24 DIAGNOSIS — D649 Anemia, unspecified: Secondary | ICD-10-CM | POA: Diagnosis not present

## 2024-07-24 DIAGNOSIS — L03116 Cellulitis of left lower limb: Secondary | ICD-10-CM | POA: Diagnosis not present

## 2024-07-24 DIAGNOSIS — L89154 Pressure ulcer of sacral region, stage 4: Secondary | ICD-10-CM | POA: Diagnosis not present

## 2024-07-24 DIAGNOSIS — S42309A Unspecified fracture of shaft of humerus, unspecified arm, initial encounter for closed fracture: Secondary | ICD-10-CM | POA: Diagnosis not present

## 2024-07-24 DIAGNOSIS — L89316 Pressure-induced deep tissue damage of right buttock: Secondary | ICD-10-CM | POA: Diagnosis not present

## 2024-07-24 DIAGNOSIS — E119 Type 2 diabetes mellitus without complications: Secondary | ICD-10-CM | POA: Diagnosis not present

## 2024-07-24 DIAGNOSIS — L03115 Cellulitis of right lower limb: Secondary | ICD-10-CM | POA: Diagnosis not present

## 2024-07-24 DIAGNOSIS — R296 Repeated falls: Secondary | ICD-10-CM | POA: Diagnosis not present

## 2024-07-25 DIAGNOSIS — M6282 Rhabdomyolysis: Secondary | ICD-10-CM | POA: Diagnosis not present

## 2024-07-25 DIAGNOSIS — R2689 Other abnormalities of gait and mobility: Secondary | ICD-10-CM | POA: Diagnosis not present

## 2024-07-25 DIAGNOSIS — R7309 Other abnormal glucose: Secondary | ICD-10-CM | POA: Diagnosis not present

## 2024-07-25 DIAGNOSIS — E119 Type 2 diabetes mellitus without complications: Secondary | ICD-10-CM | POA: Diagnosis not present

## 2024-07-26 DIAGNOSIS — S42202D Unspecified fracture of upper end of left humerus, subsequent encounter for fracture with routine healing: Secondary | ICD-10-CM | POA: Diagnosis not present

## 2024-07-26 DIAGNOSIS — I959 Hypotension, unspecified: Secondary | ICD-10-CM | POA: Diagnosis not present

## 2024-07-26 DIAGNOSIS — J449 Chronic obstructive pulmonary disease, unspecified: Secondary | ICD-10-CM | POA: Diagnosis not present

## 2024-07-26 DIAGNOSIS — G894 Chronic pain syndrome: Secondary | ICD-10-CM | POA: Diagnosis not present

## 2024-07-26 DIAGNOSIS — E119 Type 2 diabetes mellitus without complications: Secondary | ICD-10-CM | POA: Diagnosis not present

## 2024-07-26 DIAGNOSIS — S4422XD Injury of radial nerve at upper arm level, left arm, subsequent encounter: Secondary | ICD-10-CM | POA: Diagnosis not present

## 2024-07-27 DIAGNOSIS — E119 Type 2 diabetes mellitus without complications: Secondary | ICD-10-CM | POA: Diagnosis not present

## 2024-07-27 DIAGNOSIS — R2689 Other abnormalities of gait and mobility: Secondary | ICD-10-CM | POA: Diagnosis not present

## 2024-07-27 DIAGNOSIS — M6282 Rhabdomyolysis: Secondary | ICD-10-CM | POA: Diagnosis not present

## 2024-07-30 DIAGNOSIS — L89154 Pressure ulcer of sacral region, stage 4: Secondary | ICD-10-CM | POA: Diagnosis not present

## 2024-07-31 DIAGNOSIS — M6282 Rhabdomyolysis: Secondary | ICD-10-CM | POA: Diagnosis not present

## 2024-07-31 DIAGNOSIS — R2689 Other abnormalities of gait and mobility: Secondary | ICD-10-CM | POA: Diagnosis not present

## 2024-07-31 DIAGNOSIS — D72829 Elevated white blood cell count, unspecified: Secondary | ICD-10-CM | POA: Diagnosis not present

## 2024-07-31 DIAGNOSIS — E119 Type 2 diabetes mellitus without complications: Secondary | ICD-10-CM | POA: Diagnosis not present

## 2024-07-31 DIAGNOSIS — L89316 Pressure-induced deep tissue damage of right buttock: Secondary | ICD-10-CM | POA: Diagnosis not present

## 2024-07-31 DIAGNOSIS — L03116 Cellulitis of left lower limb: Secondary | ICD-10-CM | POA: Diagnosis not present

## 2024-07-31 DIAGNOSIS — L03115 Cellulitis of right lower limb: Secondary | ICD-10-CM | POA: Diagnosis not present

## 2024-07-31 DIAGNOSIS — L89154 Pressure ulcer of sacral region, stage 4: Secondary | ICD-10-CM | POA: Diagnosis not present

## 2024-08-01 DIAGNOSIS — M6282 Rhabdomyolysis: Secondary | ICD-10-CM | POA: Diagnosis not present

## 2024-08-01 DIAGNOSIS — E119 Type 2 diabetes mellitus without complications: Secondary | ICD-10-CM | POA: Diagnosis not present

## 2024-08-01 DIAGNOSIS — R2689 Other abnormalities of gait and mobility: Secondary | ICD-10-CM | POA: Diagnosis not present

## 2024-08-02 DIAGNOSIS — D72829 Elevated white blood cell count, unspecified: Secondary | ICD-10-CM | POA: Diagnosis not present

## 2024-08-02 DIAGNOSIS — M6282 Rhabdomyolysis: Secondary | ICD-10-CM | POA: Diagnosis not present

## 2024-08-02 DIAGNOSIS — I739 Peripheral vascular disease, unspecified: Secondary | ICD-10-CM | POA: Diagnosis not present

## 2024-08-02 DIAGNOSIS — R2689 Other abnormalities of gait and mobility: Secondary | ICD-10-CM | POA: Diagnosis not present

## 2024-08-02 DIAGNOSIS — L89154 Pressure ulcer of sacral region, stage 4: Secondary | ICD-10-CM | POA: Diagnosis not present

## 2024-08-02 DIAGNOSIS — E876 Hypokalemia: Secondary | ICD-10-CM | POA: Diagnosis not present

## 2024-08-02 DIAGNOSIS — I959 Hypotension, unspecified: Secondary | ICD-10-CM | POA: Diagnosis not present

## 2024-08-02 DIAGNOSIS — E119 Type 2 diabetes mellitus without complications: Secondary | ICD-10-CM | POA: Diagnosis not present

## 2024-08-02 DIAGNOSIS — N179 Acute kidney failure, unspecified: Secondary | ICD-10-CM | POA: Diagnosis not present

## 2024-08-02 DIAGNOSIS — J449 Chronic obstructive pulmonary disease, unspecified: Secondary | ICD-10-CM | POA: Diagnosis not present

## 2024-08-02 DIAGNOSIS — G894 Chronic pain syndrome: Secondary | ICD-10-CM | POA: Diagnosis not present

## 2024-08-03 DIAGNOSIS — G894 Chronic pain syndrome: Secondary | ICD-10-CM | POA: Diagnosis not present

## 2024-08-03 DIAGNOSIS — I739 Peripheral vascular disease, unspecified: Secondary | ICD-10-CM | POA: Diagnosis not present

## 2024-08-03 DIAGNOSIS — R2689 Other abnormalities of gait and mobility: Secondary | ICD-10-CM | POA: Diagnosis not present

## 2024-08-03 DIAGNOSIS — E119 Type 2 diabetes mellitus without complications: Secondary | ICD-10-CM | POA: Diagnosis not present

## 2024-08-03 DIAGNOSIS — M6282 Rhabdomyolysis: Secondary | ICD-10-CM | POA: Diagnosis not present

## 2024-08-03 DIAGNOSIS — J449 Chronic obstructive pulmonary disease, unspecified: Secondary | ICD-10-CM | POA: Diagnosis not present

## 2024-08-03 DIAGNOSIS — N179 Acute kidney failure, unspecified: Secondary | ICD-10-CM | POA: Diagnosis not present

## 2024-08-04 DIAGNOSIS — J449 Chronic obstructive pulmonary disease, unspecified: Secondary | ICD-10-CM | POA: Diagnosis not present

## 2024-08-04 DIAGNOSIS — I959 Hypotension, unspecified: Secondary | ICD-10-CM | POA: Diagnosis not present

## 2024-08-04 DIAGNOSIS — N179 Acute kidney failure, unspecified: Secondary | ICD-10-CM | POA: Diagnosis not present

## 2024-08-04 DIAGNOSIS — G894 Chronic pain syndrome: Secondary | ICD-10-CM | POA: Diagnosis not present

## 2024-08-04 DIAGNOSIS — E876 Hypokalemia: Secondary | ICD-10-CM | POA: Diagnosis not present

## 2024-08-06 DIAGNOSIS — E119 Type 2 diabetes mellitus without complications: Secondary | ICD-10-CM | POA: Diagnosis not present

## 2024-08-06 DIAGNOSIS — G894 Chronic pain syndrome: Secondary | ICD-10-CM | POA: Diagnosis not present

## 2024-08-06 DIAGNOSIS — J449 Chronic obstructive pulmonary disease, unspecified: Secondary | ICD-10-CM | POA: Diagnosis not present

## 2024-08-06 DIAGNOSIS — E876 Hypokalemia: Secondary | ICD-10-CM | POA: Diagnosis not present

## 2024-08-07 DIAGNOSIS — M6282 Rhabdomyolysis: Secondary | ICD-10-CM | POA: Diagnosis not present

## 2024-08-07 DIAGNOSIS — J449 Chronic obstructive pulmonary disease, unspecified: Secondary | ICD-10-CM | POA: Diagnosis not present

## 2024-08-07 DIAGNOSIS — D72829 Elevated white blood cell count, unspecified: Secondary | ICD-10-CM | POA: Diagnosis not present

## 2024-08-07 DIAGNOSIS — L03115 Cellulitis of right lower limb: Secondary | ICD-10-CM | POA: Diagnosis not present

## 2024-08-07 DIAGNOSIS — L89154 Pressure ulcer of sacral region, stage 4: Secondary | ICD-10-CM | POA: Diagnosis not present

## 2024-08-07 DIAGNOSIS — E876 Hypokalemia: Secondary | ICD-10-CM | POA: Diagnosis not present

## 2024-08-07 DIAGNOSIS — G894 Chronic pain syndrome: Secondary | ICD-10-CM | POA: Diagnosis not present

## 2024-08-07 DIAGNOSIS — L03116 Cellulitis of left lower limb: Secondary | ICD-10-CM | POA: Diagnosis not present

## 2024-08-07 DIAGNOSIS — L89316 Pressure-induced deep tissue damage of right buttock: Secondary | ICD-10-CM | POA: Diagnosis not present

## 2024-08-07 DIAGNOSIS — R2689 Other abnormalities of gait and mobility: Secondary | ICD-10-CM | POA: Diagnosis not present

## 2024-08-07 DIAGNOSIS — E119 Type 2 diabetes mellitus without complications: Secondary | ICD-10-CM | POA: Diagnosis not present

## 2024-08-08 ENCOUNTER — Encounter: Payer: Self-pay | Admitting: General Surgery

## 2024-08-08 ENCOUNTER — Other Ambulatory Visit: Payer: Self-pay

## 2024-08-08 ENCOUNTER — Emergency Department

## 2024-08-08 ENCOUNTER — Inpatient Hospital Stay
Admission: EM | Admit: 2024-08-08 | Discharge: 2024-08-17 | DRG: 580 | Disposition: A | Discharge status: Skilled Nursing Facility | Source: Skilled Nursing Facility | Attending: Internal Medicine | Admitting: Internal Medicine

## 2024-08-08 ENCOUNTER — Ambulatory Visit (INDEPENDENT_AMBULATORY_CARE_PROVIDER_SITE_OTHER): Admitting: General Surgery

## 2024-08-08 VITALS — BP 108/52 | HR 89 | Temp 99.3°F | Ht 64.0 in | Wt 114.0 lb

## 2024-08-08 DIAGNOSIS — S42302P Unspecified fracture of shaft of humerus, left arm, subsequent encounter for fracture with malunion: Secondary | ICD-10-CM | POA: Diagnosis not present

## 2024-08-08 DIAGNOSIS — B9683 Acinetobacter baumannii as the cause of diseases classified elsewhere: Secondary | ICD-10-CM | POA: Diagnosis present

## 2024-08-08 DIAGNOSIS — D649 Anemia, unspecified: Secondary | ICD-10-CM | POA: Diagnosis not present

## 2024-08-08 DIAGNOSIS — E876 Hypokalemia: Secondary | ICD-10-CM | POA: Diagnosis present

## 2024-08-08 DIAGNOSIS — S31000A Unspecified open wound of lower back and pelvis without penetration into retroperitoneum, initial encounter: Secondary | ICD-10-CM | POA: Diagnosis present

## 2024-08-08 DIAGNOSIS — B9620 Unspecified Escherichia coli [E. coli] as the cause of diseases classified elsewhere: Secondary | ICD-10-CM | POA: Diagnosis present

## 2024-08-08 DIAGNOSIS — Z9882 Breast implant status: Secondary | ICD-10-CM

## 2024-08-08 DIAGNOSIS — J449 Chronic obstructive pulmonary disease, unspecified: Secondary | ICD-10-CM | POA: Diagnosis present

## 2024-08-08 DIAGNOSIS — E1122 Type 2 diabetes mellitus with diabetic chronic kidney disease: Secondary | ICD-10-CM | POA: Diagnosis present

## 2024-08-08 DIAGNOSIS — Z79899 Other long term (current) drug therapy: Secondary | ICD-10-CM | POA: Diagnosis not present

## 2024-08-08 DIAGNOSIS — D509 Iron deficiency anemia, unspecified: Secondary | ICD-10-CM | POA: Diagnosis present

## 2024-08-08 DIAGNOSIS — L089 Local infection of the skin and subcutaneous tissue, unspecified: Secondary | ICD-10-CM

## 2024-08-08 DIAGNOSIS — M549 Dorsalgia, unspecified: Secondary | ICD-10-CM | POA: Diagnosis not present

## 2024-08-08 DIAGNOSIS — Z87891 Personal history of nicotine dependence: Secondary | ICD-10-CM

## 2024-08-08 DIAGNOSIS — S42302D Unspecified fracture of shaft of humerus, left arm, subsequent encounter for fracture with routine healing: Secondary | ICD-10-CM | POA: Diagnosis not present

## 2024-08-08 DIAGNOSIS — M85822 Other specified disorders of bone density and structure, left upper arm: Secondary | ICD-10-CM | POA: Diagnosis not present

## 2024-08-08 DIAGNOSIS — I5032 Chronic diastolic (congestive) heart failure: Secondary | ICD-10-CM | POA: Diagnosis present

## 2024-08-08 DIAGNOSIS — F339 Major depressive disorder, recurrent, unspecified: Secondary | ICD-10-CM | POA: Diagnosis present

## 2024-08-08 DIAGNOSIS — F1120 Opioid dependence, uncomplicated: Secondary | ICD-10-CM | POA: Diagnosis present

## 2024-08-08 DIAGNOSIS — W19XXXD Unspecified fall, subsequent encounter: Secondary | ICD-10-CM | POA: Diagnosis present

## 2024-08-08 DIAGNOSIS — E1152 Type 2 diabetes mellitus with diabetic peripheral angiopathy with gangrene: Secondary | ICD-10-CM | POA: Diagnosis present

## 2024-08-08 DIAGNOSIS — M21331 Wrist drop, right wrist: Secondary | ICD-10-CM | POA: Diagnosis present

## 2024-08-08 DIAGNOSIS — L89150 Pressure ulcer of sacral region, unstageable: Secondary | ICD-10-CM

## 2024-08-08 DIAGNOSIS — L89154 Pressure ulcer of sacral region, stage 4: Principal | ICD-10-CM | POA: Diagnosis present

## 2024-08-08 DIAGNOSIS — L89314 Pressure ulcer of right buttock, stage 4: Secondary | ICD-10-CM | POA: Diagnosis present

## 2024-08-08 DIAGNOSIS — R6889 Other general symptoms and signs: Secondary | ICD-10-CM | POA: Diagnosis not present

## 2024-08-08 DIAGNOSIS — L98416 Non-pressure chronic ulcer of buttock with bone involvement without evidence of necrosis: Secondary | ICD-10-CM | POA: Diagnosis not present

## 2024-08-08 DIAGNOSIS — E440 Moderate protein-calorie malnutrition: Secondary | ICD-10-CM | POA: Diagnosis present

## 2024-08-08 DIAGNOSIS — Z8249 Family history of ischemic heart disease and other diseases of the circulatory system: Secondary | ICD-10-CM

## 2024-08-08 DIAGNOSIS — I872 Venous insufficiency (chronic) (peripheral): Secondary | ICD-10-CM | POA: Diagnosis present

## 2024-08-08 DIAGNOSIS — I130 Hypertensive heart and chronic kidney disease with heart failure and stage 1 through stage 4 chronic kidney disease, or unspecified chronic kidney disease: Secondary | ICD-10-CM | POA: Diagnosis present

## 2024-08-08 DIAGNOSIS — L89616 Pressure-induced deep tissue damage of right heel: Secondary | ICD-10-CM | POA: Diagnosis present

## 2024-08-08 DIAGNOSIS — Z833 Family history of diabetes mellitus: Secondary | ICD-10-CM | POA: Diagnosis not present

## 2024-08-08 DIAGNOSIS — N1831 Chronic kidney disease, stage 3a: Secondary | ICD-10-CM | POA: Diagnosis present

## 2024-08-08 DIAGNOSIS — Z8619 Personal history of other infectious and parasitic diseases: Secondary | ICD-10-CM

## 2024-08-08 DIAGNOSIS — Z881 Allergy status to other antibiotic agents status: Secondary | ICD-10-CM

## 2024-08-08 DIAGNOSIS — G894 Chronic pain syndrome: Secondary | ICD-10-CM | POA: Diagnosis present

## 2024-08-08 DIAGNOSIS — S31000D Unspecified open wound of lower back and pelvis without penetration into retroperitoneum, subsequent encounter: Secondary | ICD-10-CM | POA: Diagnosis not present

## 2024-08-08 DIAGNOSIS — S31819A Unspecified open wound of right buttock, initial encounter: Principal | ICD-10-CM

## 2024-08-08 DIAGNOSIS — Z681 Body mass index (BMI) 19 or less, adult: Secondary | ICD-10-CM | POA: Diagnosis not present

## 2024-08-08 DIAGNOSIS — Z7401 Bed confinement status: Secondary | ICD-10-CM | POA: Diagnosis not present

## 2024-08-08 DIAGNOSIS — R296 Repeated falls: Secondary | ICD-10-CM | POA: Diagnosis present

## 2024-08-08 DIAGNOSIS — I9589 Other hypotension: Secondary | ICD-10-CM | POA: Diagnosis not present

## 2024-08-08 DIAGNOSIS — Z743 Need for continuous supervision: Secondary | ICD-10-CM | POA: Diagnosis not present

## 2024-08-08 DIAGNOSIS — Z87442 Personal history of urinary calculi: Secondary | ICD-10-CM

## 2024-08-08 DIAGNOSIS — B182 Chronic viral hepatitis C: Secondary | ICD-10-CM | POA: Diagnosis present

## 2024-08-08 DIAGNOSIS — L89626 Pressure-induced deep tissue damage of left heel: Secondary | ICD-10-CM | POA: Diagnosis present

## 2024-08-08 DIAGNOSIS — R7401 Elevation of levels of liver transaminase levels: Secondary | ICD-10-CM | POA: Diagnosis not present

## 2024-08-08 DIAGNOSIS — S31819D Unspecified open wound of right buttock, subsequent encounter: Secondary | ICD-10-CM | POA: Diagnosis not present

## 2024-08-08 DIAGNOSIS — S42292D Other displaced fracture of upper end of left humerus, subsequent encounter for fracture with routine healing: Secondary | ICD-10-CM | POA: Diagnosis not present

## 2024-08-08 DIAGNOSIS — M21332 Wrist drop, left wrist: Secondary | ICD-10-CM | POA: Diagnosis present

## 2024-08-08 DIAGNOSIS — R54 Age-related physical debility: Secondary | ICD-10-CM | POA: Diagnosis present

## 2024-08-08 DIAGNOSIS — Z9049 Acquired absence of other specified parts of digestive tract: Secondary | ICD-10-CM

## 2024-08-08 LAB — CBC WITH DIFFERENTIAL/PLATELET
Abs Immature Granulocytes: 0.03 K/uL (ref 0.00–0.07)
Basophils Absolute: 0 K/uL (ref 0.0–0.1)
Basophils Relative: 0 %
Eosinophils Absolute: 0.1 K/uL (ref 0.0–0.5)
Eosinophils Relative: 1 %
HCT: 37.9 % (ref 36.0–46.0)
Hemoglobin: 11.8 g/dL — ABNORMAL LOW (ref 12.0–15.0)
Immature Granulocytes: 0 %
Lymphocytes Relative: 15 %
Lymphs Abs: 1.3 K/uL (ref 0.7–4.0)
MCH: 26 pg (ref 26.0–34.0)
MCHC: 31.1 g/dL (ref 30.0–36.0)
MCV: 83.7 fL (ref 80.0–100.0)
Monocytes Absolute: 0.6 K/uL (ref 0.1–1.0)
Monocytes Relative: 8 %
Neutro Abs: 6.3 K/uL (ref 1.7–7.7)
Neutrophils Relative %: 76 %
Platelets: 288 K/uL (ref 150–400)
RBC: 4.53 MIL/uL (ref 3.87–5.11)
RDW: 15.2 % (ref 11.5–15.5)
WBC: 8.4 K/uL (ref 4.0–10.5)
nRBC: 0 % (ref 0.0–0.2)

## 2024-08-08 LAB — MAGNESIUM: Magnesium: 2.1 mg/dL (ref 1.7–2.4)

## 2024-08-08 LAB — COMPREHENSIVE METABOLIC PANEL WITH GFR
ALT: 58 U/L — ABNORMAL HIGH (ref 0–44)
AST: 56 U/L — ABNORMAL HIGH (ref 15–41)
Albumin: 3 g/dL — ABNORMAL LOW (ref 3.5–5.0)
Alkaline Phosphatase: 99 U/L (ref 38–126)
Anion gap: 15 (ref 5–15)
BUN: 13 mg/dL (ref 8–23)
CO2: 29 mmol/L (ref 22–32)
Calcium: 8.9 mg/dL (ref 8.9–10.3)
Chloride: 96 mmol/L — ABNORMAL LOW (ref 98–111)
Creatinine, Ser: 0.91 mg/dL (ref 0.44–1.00)
GFR, Estimated: >60 mL/min (ref >60)
Glucose, Bld: 134 mg/dL — ABNORMAL HIGH (ref 70–99)
Potassium: 2.7 mmol/L — CL (ref 3.5–5.1)
Sodium: 140 mmol/L (ref 135–145)
Total Bilirubin: 0.6 mg/dL (ref 0.0–1.2)
Total Protein: 6.9 g/dL (ref 6.5–8.1)

## 2024-08-08 LAB — LACTIC ACID, PLASMA: Lactic Acid, Venous: 1.6 mmol/L (ref 0.5–1.9)

## 2024-08-08 LAB — PHOSPHORUS: Phosphorus: 3.1 mg/dL (ref 2.5–4.6)

## 2024-08-08 LAB — BRAIN NATRIURETIC PEPTIDE: B Natriuretic Peptide: 229.8 pg/mL — ABNORMAL HIGH (ref 0.0–100.0)

## 2024-08-08 MED ORDER — ARIPIPRAZOLE 10 MG PO TABS
30.0000 mg | ORAL_TABLET | Freq: Every day | ORAL | Status: DC
  Administered 2024-08-09 – 2024-08-17 (×9): 30 mg via ORAL
  Filled 2024-08-08 (×9): qty 3

## 2024-08-08 MED ORDER — PIPERACILLIN-TAZOBACTAM 3.375 G IVPB 30 MIN
3.3750 g | Freq: Once | INTRAVENOUS | Status: AC
  Administered 2024-08-08: 3.375 g via INTRAVENOUS
  Filled 2024-08-08: qty 50

## 2024-08-08 MED ORDER — OXYCODONE HCL 5 MG PO TABS
5.0000 mg | ORAL_TABLET | ORAL | Status: DC | PRN
  Administered 2024-08-09 – 2024-08-13 (×16): 5 mg via ORAL
  Filled 2024-08-08 (×16): qty 1

## 2024-08-08 MED ORDER — VITAMIN C 500 MG PO TABS
500.0000 mg | ORAL_TABLET | Freq: Every day | ORAL | Status: DC
  Administered 2024-08-09 – 2024-08-17 (×9): 500 mg via ORAL
  Filled 2024-08-08 (×9): qty 1

## 2024-08-08 MED ORDER — POTASSIUM CHLORIDE 10 MEQ/100ML IV SOLN
10.0000 meq | INTRAVENOUS | Status: AC
  Administered 2024-08-08 – 2024-08-09 (×3): 10 meq via INTRAVENOUS
  Filled 2024-08-08 (×3): qty 100

## 2024-08-08 MED ORDER — VANCOMYCIN HCL 1250 MG/250ML IV SOLN
1250.0000 mg | Freq: Once | INTRAVENOUS | Status: AC
  Administered 2024-08-08: 1250 mg via INTRAVENOUS
  Filled 2024-08-08: qty 250

## 2024-08-08 MED ORDER — POTASSIUM CHLORIDE CRYS ER 20 MEQ PO TBCR
40.0000 meq | EXTENDED_RELEASE_TABLET | Freq: Once | ORAL | Status: AC
  Administered 2024-08-08: 40 meq via ORAL
  Filled 2024-08-08: qty 2

## 2024-08-08 MED ORDER — VANCOMYCIN HCL 750 MG/150ML IV SOLN: 750.0000 mg | INTRAVENOUS | Status: DC

## 2024-08-08 MED ORDER — ADULT MULTIVITAMIN W/MINERALS CH
1.0000 | ORAL_TABLET | Freq: Every day | ORAL | Status: DC
  Administered 2024-08-09 – 2024-08-17 (×9): 1 via ORAL
  Filled 2024-08-08 (×9): qty 1

## 2024-08-08 MED ORDER — VANCOMYCIN HCL IN DEXTROSE 1-5 GM/200ML-% IV SOLN
1000.0000 mg | Freq: Once | INTRAVENOUS | Status: DC
  Filled 2024-08-08: qty 200

## 2024-08-08 MED ORDER — LACTATED RINGERS IV SOLN: INTRAVENOUS | Status: AC

## 2024-08-08 MED ORDER — HEPARIN SODIUM (PORCINE) 5000 UNIT/ML IJ SOLN
5000.0000 [IU] | Freq: Three times a day (TID) | INTRAMUSCULAR | Status: DC
  Administered 2024-08-08 – 2024-08-17 (×25): 5000 [IU] via SUBCUTANEOUS
  Filled 2024-08-08 (×25): qty 1

## 2024-08-08 MED ORDER — MIDODRINE HCL 5 MG PO TABS
10.0000 mg | ORAL_TABLET | Freq: Three times a day (TID) | ORAL | Status: DC
Start: 2024-08-09 — End: 2024-08-08

## 2024-08-08 MED ORDER — IBUPROFEN 400 MG PO TABS
400.0000 mg | ORAL_TABLET | Freq: Four times a day (QID) | ORAL | Status: DC | PRN
  Administered 2024-08-11: 400 mg via ORAL
  Filled 2024-08-08 (×2): qty 1

## 2024-08-08 MED ORDER — METHADONE HCL 10 MG/ML PO CONC: 100.0000 mg | Freq: Every day | ORAL | Status: DC

## 2024-08-08 MED ORDER — VITAMIN B-12 1000 MCG PO TABS
500.0000 ug | ORAL_TABLET | Freq: Every day | ORAL | Status: DC
  Administered 2024-08-09 – 2024-08-17 (×9): 500 ug via ORAL
  Filled 2024-08-08 (×9): qty 1

## 2024-08-08 MED ORDER — ALBUTEROL SULFATE (2.5 MG/3ML) 0.083% IN NEBU: 2.5000 mg | INHALATION_SOLUTION | RESPIRATORY_TRACT | Status: DC | PRN

## 2024-08-08 MED ORDER — LACTATED RINGERS IV BOLUS
1000.0000 mL | Freq: Once | INTRAVENOUS | Status: AC
  Administered 2024-08-08: 1000 mL via INTRAVENOUS

## 2024-08-08 MED ORDER — ACETAMINOPHEN 500 MG PO TABS
1000.0000 mg | ORAL_TABLET | Freq: Once | ORAL | Status: AC
  Administered 2024-08-08: 1000 mg via ORAL
  Filled 2024-08-08: qty 2

## 2024-08-08 MED ORDER — POLYSACCHARIDE IRON COMPLEX 150 MG PO CAPS
150.0000 mg | ORAL_CAPSULE | Freq: Every day | ORAL | Status: DC
  Administered 2024-08-09 – 2024-08-17 (×9): 150 mg via ORAL
  Filled 2024-08-08 (×9): qty 1

## 2024-08-08 MED ORDER — MIDODRINE HCL 5 MG PO TABS
10.0000 mg | ORAL_TABLET | Freq: Three times a day (TID) | ORAL | Status: DC
  Administered 2024-08-08 – 2024-08-17 (×14): 10 mg via ORAL
  Filled 2024-08-08 (×21): qty 2

## 2024-08-08 MED ORDER — ONDANSETRON HCL 4 MG/2ML IJ SOLN: 4.0000 mg | Freq: Three times a day (TID) | INTRAMUSCULAR | Status: DC | PRN

## 2024-08-08 MED ORDER — LACTATED RINGERS IV BOLUS
500.0000 mL | Freq: Once | INTRAVENOUS | Status: AC
  Administered 2024-08-08: 500 mL via INTRAVENOUS

## 2024-08-08 MED ORDER — PIPERACILLIN-TAZOBACTAM 3.375 G IVPB
3.3750 g | Freq: Three times a day (TID) | INTRAVENOUS | Status: DC
  Administered 2024-08-09 – 2024-08-12 (×11): 3.375 g via INTRAVENOUS
  Filled 2024-08-08 (×10): qty 50

## 2024-08-08 MED ORDER — DM-GUAIFENESIN ER 30-600 MG PO TB12: 1.0000 | ORAL_TABLET | Freq: Two times a day (BID) | ORAL | Status: DC | PRN

## 2024-08-08 NOTE — Patient Instructions (Signed)
 Pressure Injury  A pressure injury, also called a pressure ulcer or bedsore, is an injury to skin and the tissue under the skin that is caused by pressure. It often affects people who must spend a long time in a bed or chair because of a medical condition. Pressure injuries often occur: Over bony parts of the body, such as the tailbone, shoulders, elbows, hips, heels, spine, ankles, and back of the head. Under medical devices that touch the body. These include stockings, equipment to help with breathing, tubes, and splints. Inside the mouth or nose from dentures or tubes. Pressure injuries start as red areas on the skin and can lead to pain and an open wound. What are the causes? This condition is caused by frequent or constant pressure to an area of the body. Less blood flow to the skin can make the tissue die and break down over time, causing a wound. What increases the risk? You are more likely to develop this condition if: You are in the hospital or an extended care facility. You are bedridden or in a wheelchair. You have an injury or disease that keeps you from moving well and feeling pain or pressure. You have a condition that: Makes you sleepy or less alert. Causes poor blood flow. You need to wear a medical device. You have poor control of your bladder or bowel movements (incontinence). You are not getting enough fluid or nutrients (malnutrition). Your health care provider may recommend certain types of mattresses, mattress covers, pillows, cushions, or boots to help prevent a pressure injury. These may include products filled with air, foam, gel, or sand. What are the signs or symptoms? Symptoms of this condition depend on how severe your injury is. Symptoms may include: Red or dark areas of the skin. Pain or a change in skin texture. Your skin may feel warmer, cooler, softer, or firmer. Blisters. An open wound. How is this diagnosed? This condition is diagnosed based on a  medical history and physical exam. You may also have tests, such as: Blood tests. Imaging tests. Blood flow tests. Your injury will be staged based on how severe it is. Staging is based on: How deep the tissue injury is. This includes whether muscle, bone, tendon, or dead tissue is exposed. The cause of the injury. How is this treated? This condition may be treated by: Reducing pressure on your skin. You may need to: Change your position often. Avoid positions that caused the wound or that may make the wound worse. Use certain mattresses, overlays, chair cushions, or protective boots. Move medical devices from an area of pressure, or place padding between the skin and the device. Use foams, creams, or powders to protect your skin from sweat, urine, and stool and reduce rubbing (friction) on the skin. Keeping your skin clean and dry. This may include using a skin cleanser or barrier as told by your health care provider. Cleaning your injury and getting rid of any dead tissue from the wound (debridement). Placing a protective medicine, such as a cream, or bandage (dressing) over your injury. Using medicines for pain or to prevent or treat infection. Surgery may be needed if other treatments are not working or if your injury is very deep. Follow these instructions at home: Medicines Take over-the-counter and prescription medicines only as told by your health care provider. If you were prescribed antibiotics, take or apply them as told by your health care provider. Do not stop using the antibiotic even if you start to  feel better. Eating and drinking Drink enough fluid to keep your urine pale yellow. Eat a healthy diet with lots of protein, as told by your health care provider. Do not use drugs or drink alcohol. Wound care Follow instructions from your health care provider about how to take care of your wound. Make sure you: Wash your hands with soap and water before and after you change  your dressing or apply medicine to your skin. If soap and water are not available, use hand sanitizer. Change your dressing as told by your health care provider. Check your wound every day for signs of infection. Have a caregiver do this for you if you are not able. Check for: Redness, swelling, or more pain. More fluid or blood. Warmth. Pus or a bad smell. Skin care Keep your skin clean and dry. Gently pat your skin dry. Do not rub or massage your skin. Check your skin every day for any changes in color or any new blisters or sores (ulcers). Reducing pressure Do not lie or sit in one position for a long time. Move or change position every 1-2 hours, or as told by your health care provider. Use pillows or cushions to reduce pressure. Ask your health care provider what cushions or pads you should use. General instructions Do not use any products that contain nicotine or tobacco. These products include cigarettes, chewing tobacco, and vaping devices, such as e-cigarettes. If you need help quitting, ask your health care provider. Try to be active every day. Ask your health care provider what exercises or activities are safe for you. Keep all follow-up visits. Your health care provider will check if your injury is healing. Contact a health care provider if: You have a fever or chills. You have pain that does not get better with medicine. Your skin changes color. You have new blisters or sores. You have signs of infection. Your wound does not get better after 1-2 weeks of treatment. This information is not intended to replace advice given to you by your health care provider. Make sure you discuss any questions you have with your health care provider. Document Revised: 04/21/2022 Document Reviewed: 03/27/2022 Elsevier Patient Education  2024 [employer].

## 2024-08-08 NOTE — H&P (Signed)
 History and Physical    Cristin Penaflor Tanzi FMW:[id number] DOB: [birth year] DOA: 08/08/2024  Referring MD/NP/PA:   PCP: Vicci Duwaine SQUIBB, DO   Patient coming from:  The patient is coming from SNF.     Chief Complaint: sacral wound infection  HPI: Kim Graham is a 73 y.o. female with medical history significant of chronic pain on methadone , COPD, diet-controled type 2 diabetes, history of HCV status posttreatment, dCHF, depression, CKD-3A, bile leakage, chronic venous insufficiency, kidney stone, chronic hypotension on midodrine , who presents with sacral wound infection.  Pt has hx of pilonidal cyst and underwent extensive resection in the past, with a chronic sacral wound in right buttock area. Pt is following up with Dr. Marinda of surgery. Pt states that she has worsening pain in the right buttock wound recently.  The pain is constant, aching, severe, nonradiating, aggravated by movement.  Pt was seen by Dr. Marinda today who recommended hospital admission and operative debridement.  Patient was recently treated with Keflex , clindamycin and Flagyl  without improvement.  Patient has fever 100.3 in ED. Patient does not have chest pain, cough, SOB.  No nausea, vomiting, diarrhea or abdominal pain.  No symptoms of UTI. Pt strongly refused my examination of her sacral wound. Pt does not want to turn over for examination of sacral wound due to pain.  Per ED physician's note, pt has a  large wound to right buttock with surrounding erythema and overlying eschar with purulent drainage.  Of note, patient was recently hospitalized from 7/9 - 7/17 due to right leg cellulitis, rhabdomyolysis and left humerus fracture due to frequent fall.   Data reviewed independently and ED Course: pt was found to have WBC 8.4, GFR> 60, lactic acid 1.6, potassium 2.7, magnesium  2.1.  Temperature 100.3, blood pressure 97/48, heart rate 89, RR 16, oxygen saturation 94% on room air.  Patient is admitted to telemetry bed as  inpatient.   EKG: Not done in ED, will get one.    Review of Systems:   General: has fevers, chills, no body weight gain, has fatigue HEENT: no blurry vision, hearing changes or sore throat Respiratory: no dyspnea, coughing, wheezing CV: no chest pain, no palpitations GI: no nausea, vomiting, abdominal pain, diarrhea, constipation GU: no dysuria, burning on urination, increased urinary frequency, hematuria  Ext: has trace leg edema Neuro: no unilateral weakness, numbness, or tingling, no vision change or hearing loss Skin: has sacral wound MSK: No muscle spasm, no deformity, no limitation of range of movement in spin Heme: No easy bruising.  Travel history: No recent long distant travel.   Allergy:  Allergies  Allergen Reactions   Doxycycline  Diarrhea    Very bad diarrhea    Past Medical History:  Diagnosis Date   Acute cholecystitis 12/09/2021   Bile leak    Chronic kidney disease, stage 3a (HCC)    Chronic venous insufficiency    COPD (chronic obstructive pulmonary disease) (HCC)    Depression    Hep C w/o coma, chronic (HCC)    treated   History of kidney stones    Hypertension    Methadone  dependence (HCC)    Nephrolithiasis    Pre-diabetes    Sepsis secondary to UTI (HCC)    Thrombocytopenia     Past Surgical History:  Procedure Laterality Date   CYSTOSCOPY W/ URETERAL STENT PLACEMENT Right 12/09/2021   Procedure: CYSTOSCOPY WITH RETROGRADE PYELOGRAM/URETERAL STENT PLACEMENT;  Surgeon: Twylla Glendia BROCKS, MD;  Location: [facility];  Service: Urology;  Laterality:  Right;   CYSTOSCOPY/URETEROSCOPY/HOLMIUM LASER/STENT PLACEMENT Bilateral 02/11/2022   Procedure: CYSTOSCOPY/URETEROSCOPY/HOLMIUM LASER/STENT PLACEMENT & RIGHT URETERAL STENT EXCHANGE;  Surgeon: Twylla Glendia BROCKS, MD;  Location: [facility];  Service: Urology;  Laterality: Bilateral;   CYSTOSCOPY/URETEROSCOPY/HOLMIUM LASER/STENT PLACEMENT Bilateral 05/19/2022   Procedure: CYSTOSCOPY/URETEROSCOPY/HOLMIUM  LASER/STENT PLACEMENT;  Surgeon: Twylla Glendia BROCKS, MD;  Location: [facility];  Service: Urology;  Laterality: Bilateral;   ENDOSCOPIC RETROGRADE CHOLANGIOPANCREATOGRAPHY (ERCP) WITH PROPOFOL  N/A 12/11/2021   Procedure: ENDOSCOPIC RETROGRADE CHOLANGIOPANCREATOGRAPHY (ERCP) WITH PROPOFOL ;  Surgeon: Jinny Carmine, MD;  Location: [facility];  Service: Endoscopy;  Laterality: N/A;   ERCP N/A 03/10/2022   Procedure: ENDOSCOPIC RETROGRADE CHOLANGIOPANCREATOGRAPHY (ERCP);  Surgeon: Jinny Carmine, MD;  Location: [facility] ENDOSCOPY;  Service: Endoscopy;  Laterality: N/A;  Stent removal   ERCP N/A 06/23/2022   Procedure: ENDOSCOPIC RETROGRADE CHOLANGIOPANCREATOGRAPHY (ERCP);  Surgeon: Jinny Carmine, MD;  Location: [facility] ENDOSCOPY;  Service: Endoscopy;  Laterality: N/A;  stent removal   PILONIDAL CYST EXCISION N/A 12/29/2023   Procedure: CYST EXCISION PILONIDAL EXTENSIVE;  Surgeon: Marinda Jayson KIDD, MD;  Location: [facility] OR[facility];  Service: General;  Laterality: N/A;   PLACEMENT OF BREAST IMPLANTS Bilateral    TONSILLECTOMY      Social History:  reports that she quit smoking about 5 years ago. Her smoking use included cigarettes. She started smoking about 54 years ago. She has a 24.5 pack-year smoking history. She has been exposed to tobacco smoke. She has never used smokeless tobacco. She reports that she does not currently use alcohol. She reports that she does not currently use drugs after having used the following drugs: Heroin.  Family History:  Family History  Problem Relation Age of Onset   Diabetes Mother    Cancer Father        Pancreatic   Diabetes Maternal Aunt    Diabetes Maternal Grandmother    Dementia Maternal Grandfather    Heart disease Maternal Grandfather      Prior to Admission medications   Medication Sig Start Date End Date Taking? Authorizing Provider  cephALEXin  (KEFLEX ) 500 MG capsule Take 500 mg by mouth 3 (three) times daily. 08/01/24  Yes [provider]  clindamycin (CLEOCIN)  150 MG capsule Take 150 mg by mouth. 07/03/24  Yes [provider]  furosemide  (LASIX ) 20 MG tablet Take 20 mg by mouth daily. 07/20/24  Yes [provider]  metroNIDAZOLE  (FLAGYL ) 500 MG tablet Take 500 mg by mouth 2 (two) times daily. 07/31/24  Yes [provider]  oxyCODONE  (OXY IR/ROXICODONE ) 5 MG immediate release tablet Take 5 mg by mouth every 6 (six) hours as needed. 07/21/24  Yes [provider]  potassium chloride  (KLOR-CON  M) 10 MEQ tablet Take 10 mEq by mouth 2 (two) times daily. 08/08/24  Yes [provider]  acetaminophen  (TYLENOL ) 325 MG tablet Take 2 tablets (650 mg total) by mouth every 6 (six) hours as needed for mild pain (pain score 1-3), fever or headache (or Fever >/= 101). 05/25/24   Von Bellis, MD  albuterol  (PROVENTIL ) (2.5 MG/3ML) 0.083% nebulizer solution Take 3 mLs (2.5 mg total) by nebulization every 6 (six) hours as needed for wheezing or shortness of breath. 04/28/24   Vicci, Megan P, DO  albuterol  (VENTOLIN  HFA) 108 (90 Base) MCG/ACT inhaler Inhale 2 puffs into the lungs every 6 (six) hours as needed for wheezing or shortness of breath. 04/28/24   Johnson, Megan P, DO  ARIPiprazole  (ABILIFY ) 30 MG tablet Take 1 tablet (30 mg total) by mouth daily. 04/28/24  Johnson, Megan P, DO  ascorbic acid  (VITAMIN C ) 500 MG tablet Take 1 tablet (500 mg total) by mouth daily. 05/26/24   Von Bellis, MD  budesonide -glycopyrrolate -formoterol  (BREZTRI  AEROSPHERE) 160-9-4.8 MCG/ACT AERO inhaler Inhale 2 puffs into the lungs 2 (two) times daily. 04/28/24   Vicci Bouchard P, DO  cyanocobalamin  (VITAMIN B12) 500 MCG tablet Take 1 tablet (500 mcg total) by mouth daily. 05/26/24   Von Bellis, MD  fluticasone  (FLONASE ) 50 MCG/ACT nasal spray Place 2 sprays into both nostrils daily. 09/21/23   Pearley, Hyla Givens, NP  iron  polysaccharides (NIFEREX) 150 MG capsule Take 1 capsule (150 mg total) by mouth daily. 05/26/24   Von Bellis, MD  methadone   (DOLOPHINE ) 10 MG/ML solution Take 10 mLs (100 mg total) by mouth daily. 05/25/24   Von Bellis, MD  midodrine  (PROAMATINE ) 10 MG tablet Take 1 tablet (10 mg total) by mouth 3 (three) times daily with meals. Hold if SBP > 120 mmHg and/or HR <65 05/25/24   Von Bellis, MD  Multiple Vitamin (MULTI-VITAMIN) tablet Take 1 tablet by mouth daily.    [provider]  Spacer/Aero-Holding Raguel FRENCH Use as directed Dx: COPD 11/24/23   Vicci Bouchard P, DO  Vitamin D , Ergocalciferol , (DRISDOL ) 1.25 MG (50000 UNIT) CAPS capsule Take 1 capsule (50,000 Units total) by mouth every 7 (seven) days. 06/01/24   Von Bellis, MD    Physical Exam: Vitals:   08/08/24 2018 08/08/24 2019 08/08/24 2030 08/08/24 2100  BP: (!) 99/44  121/78 (!) 112/48  Pulse: 72  74 68  Resp: 12  (!) 22 20  Temp:  98.8 F (37.1 C)  99.3 F (37.4 C)  TempSrc:    Oral  SpO2: 99%  97% 93%  Weight:       General: Not in acute distress HEENT:       Eyes: PERRL, EOMI, no jaundice       ENT: No discharge from the ears and nose, no pharynx injection, no tonsillar enlargement.        Neck: No JVD, no bruit, no mass felt. Heme: No neck lymph node enlargement. Cardiac: S1/S2, RRR, No murmurs, No gallops or rubs. Respiratory: No rales, wheezing, rhonchi or rubs. GI: Soft, nondistended, nontender, no rebound pain, no organomegaly, BS present. GU: No hematuria Ext: has trace leg edema bilaterally with chronic venous insufficiency. 1+DP/PT pulse bilaterally. Musculoskeletal: No joint deformities, No joint redness or warmth, no limitation of ROM in spin. Skin: pt refused to turn over for examination of her sacral wound.  Per ED Dr. Venia note, pt has a large wound to right buttock with surrounding erythema and overlying eschar with purulent drainage: Neuro: Alert, oriented X3, cranial nerves II-XII grossly intact, moves all extremities normally. Psych: Patient is not psychotic, no suicidal or hemocidal ideation.  Labs on  Admission: I have personally reviewed following labs and imaging studies  CBC: Recent Labs  Lab 08/08/24 1618  WBC 8.4  NEUTROABS 6.3  HGB 11.8*  HCT 37.9  MCV 83.7  PLT 288   Basic Metabolic Panel: Recent Labs  Lab 08/08/24 1618  NA 140  K 2.7*  CL 96*  CO2 29  GLUCOSE 134*  BUN 13  CREATININE 0.91  CALCIUM 8.9  MG 2.1  PHOS 3.1   GFR: Estimated Creatinine Clearance: 45.6 mL/min (by C-G formula based on SCr of 0.91 mg/dL). Liver Function Tests: Recent Labs  Lab 08/08/24 1618  AST 56*  ALT 58*  ALKPHOS 99  BILITOT 0.6  PROT  6.9  ALBUMIN 3.0*   No results for input(s): LIPASE, AMYLASE in the last 168 hours. No results for input(s): AMMONIA in the last 168 hours. Coagulation Profile: No results for input(s): INR, PROTIME in the last 168 hours. Cardiac Enzymes: No results for input(s): CKTOTAL, CKMB, CKMBINDEX, TROPONINI in the last 168 hours. BNP (last 3 results) No results for input(s): PROBNP in the last 8760 hours. HbA1C: No results for input(s): HGBA1C in the last 72 hours. CBG: No results for input(s): GLUCAP in the last 168 hours. Lipid Profile: No results for input(s): CHOL, HDL, LDLCALC, TRIG, CHOLHDL, LDLDIRECT in the last 72 hours. Thyroid  Function Tests: No results for input(s): TSH, T4TOTAL, FREET4, T3FREE, THYROIDAB in the last 72 hours. Anemia Panel: No results for input(s): VITAMINB12, FOLATE, FERRITIN, TIBC, IRON , RETICCTPCT in the last 72 hours. Urine analysis:    Component Value Date/Time   COLORURINE AMBER (A) 05/17/2024 1934   APPEARANCEUR CLEAR (A) 05/17/2024 1934   APPEARANCEUR Cloudy (A) 04/16/2023 1424   LABSPEC 1.019 05/17/2024 1934   PHURINE 6.0 05/17/2024 1934   GLUCOSEU NEGATIVE 05/17/2024 1934   HGBUR LARGE (A) 05/17/2024 1934   BILIRUBINUR NEGATIVE 05/17/2024 1934   BILIRUBINUR Negative 04/16/2023 1424   KETONESUR 5 (A) 05/17/2024 1934   PROTEINUR 100 (A)  05/17/2024 1934   NITRITE NEGATIVE 05/17/2024 1934   LEUKOCYTESUR TRACE (A) 05/17/2024 1934   Sepsis Labs: @LABRCNTIP (procalcitonin:4,lacticidven:4) )No results found for this or any previous visit (from the past 240 hours).   Radiological Exams on Admission:   Assessment/Plan Principal Problem:   Sacral wound with infection Active Problems:   Chronic hypotension   Hypokalemia   Chronic diastolic CHF (congestive heart failure) (HCC)   Chronic kidney disease, stage 3a (HCC)   Chronic obstructive pulmonary disease (HCC)   Methadone  dependence (HCC)   Iron  deficiency anemia   Hep C w/o coma, chronic (HCC)   Depression, recurrent   Assessment and Plan:  Sacral wound with infection: Patient has fever 100.3 and soft blood pressure, but does not meet critical sepsis, with WBC 8.3, heart rate 89, RR 16.  Lactic acid 1.6.  Patient is obviously at high risk of developing sepsis.  Dr. Marinda of surgery plans to do debridement in the morning.  - will admit to tele bed as inpatient - Empiric antimicrobial treatment with vancomycin and zosyn  - Blood cultures x 2  - ESR and CRP - wound care consult -continue home methadone  for chronic pain - As needed oxycodone  and ibuprofen  for pain - IVF: 1.5 L of LR bolus in ED, followed by 75 cc/h  (patient has congestive heart failure, limiting aggressive IV fluids treatment). - NPO after MN  Chronic hypotension: Blood pressure 97/48 now - Continue midodrine  10 mg 3 times daily  Hypokalemia: Potassium 2.7, magnesium  2.1 - Replete potassium - Check phosphorus level  Chronic diastolic CHF (congestive heart failure) (HCC): 2D echo 07/29/2020 showed EF 60 to 65% with grade 1 diastolic dysfunction.  Patient does not have SOB, only has trace leg edema, CHF seems to be compensated. - Hold Lasix  due to soft blood pressure - Check BNP  Chronic kidney disease, stage 3a (HCC): Stable.  GFR> 60 today - Follow-up with BMP  Chronic obstructive pulmonary  disease (HCC): Stable - Bronchodilators and prn Mucinex  Methadone  dependence due to chronic pain -Continue home methadone  100 mg daily - As needed oxycodone  and ibuprofen   Iron  deficiency anemia: Hemoglobin stable 11.8 -Continue iron  supplement  Hep C w/o coma, chronic (HCC): S/p  treatment.  AST 56, ALT 58, total bilirubin 0.6 today -Avoid using Tylenol   Depression, recurrent - Current continue home medications, Abilify        DVT ppx: SQ Heparin      Code Status: Full code     Family Communication:     not done, no family member is at bed side.     Disposition Plan:  Anticipate discharge back to previous environment, SNF  Consults called:  Dr. Marinda is aware of this pt  Admission status and Level of care: Telemetry Medical:  as inpt        Dispo: The patient is from: SNF              Anticipated d/c is to: SNF              Anticipated d/c date is: 2 days              Patient currently is not medically stable to d/c.    Severity of Illness:  The appropriate patient status for this patient is INPATIENT. Inpatient status is judged to be reasonable and necessary in order to provide the required intensity of service to ensure the patient's safety. The patient's presenting symptoms, physical exam findings, and initial radiographic and laboratory data in the context of their chronic comorbidities is felt to place them at high risk for further clinical deterioration. Furthermore, it is not anticipated that the patient will be medically stable for discharge from the hospital within 2 midnights of admission.   * I certify that at the point of admission it is my clinical judgment that the patient will require inpatient hospital care spanning beyond 2 midnights from the point of admission due to high intensity of service, high risk for further deterioration and high frequency of surveillance required.*       Date of Service 08/08/2024    [facility]   If 7PM-7AM, please contact night-coverage [email or web address] 08/08/2024, 9:02 PM

## 2024-08-08 NOTE — Consult Note (Signed)
 Pharmacy Antibiotic Note  Kim Graham is a 73 y.o. female admitted on 08/08/2024 with sacral wound infection.  Pharmacy has been consulted for vancomycin and Zosyn  dosing.  Plan: Vancomycin 1250 mg IV x 1, then start vancomycin 750 mg IV every 24 hours Estimated AUC 476.8, Cmin 12 51.7 kg, Scr 0.91, Vd 0.72 Vancomycin levels at steady state or as clinically indicated Zosyn  3.375g IV q8h (4 hour infusion). Follow renal function and cultures for adjustments  Weight: 51.7 kg (113 lb 15.7 oz)  Temp (24hrs), Avg:99.8 F (37.7 C), Min:99.3 F (37.4 C), Max:100.3 F (37.9 C)  Recent Labs  Lab 08/08/24 1618  WBC 8.4  CREATININE 0.91  LATICACIDVEN 1.6    Estimated Creatinine Clearance: 45.6 mL/min (by C-G formula based on SCr of 0.91 mg/dL).    Allergies  Allergen Reactions   Doxycycline  Diarrhea    Very bad diarrhea    Antimicrobials this admission: vancomycin 9/30 >>  Zosyn  9/30 >>    Microbiology results: 9/30 BCx: pending  Thank you for allowing pharmacy to be a part of this patient's care.  Kim Graham, PharmD, BCPS 08/08/2024 7:57 PM

## 2024-08-08 NOTE — ED Provider Notes (Signed)
 [facility] Provider Note    Event Date/Time   First MD Initiated Contact with Patient 08/08/24 1851     (approximate)   History   Chief Complaint Wound Infection   HPI  Kim Graham is a 73 y.o. female with past medical history of diabetes, CKD, COPD, and chronic pain syndrome who presents to the ED complaining of wound infection.  Patient reports that she has dealt with a wound on her right buttock for multiple months, went for follow-up with general surgery for this today and was referred to the ED for admission for debridement.  She reports that she has had increasing pain to her right buttock and has started noticing some drainage.  She is not sure whether she has had any fevers, denies any nausea or vomiting.     Physical Exam   Triage Vital Signs: ED Triage Vitals  Encounter Vitals Group     BP 08/08/24 1555 (!) 97/48     Girls Systolic BP Percentile --      Girls Diastolic BP Percentile --      Boys Systolic BP Percentile --      Boys Diastolic BP Percentile --      Pulse Rate 08/08/24 1555 89     Resp 08/08/24 1555 16     Temp 08/08/24 1555 100.3 F (37.9 C)     Temp Source 08/08/24 1555 Oral     SpO2 08/08/24 1555 94 %     Weight 08/08/24 1557 113 lb 15.7 oz (51.7 kg)     Height --      Head Circumference --      Peak Flow --      Pain Score 08/08/24 1556 9     Pain Loc --      Pain Education --      Exclude from Growth Chart --     Most recent vital signs: Vitals:   08/08/24 1555  BP: (!) 97/48  Pulse: 89  Resp: 16  Temp: 100.3 F (37.9 C)  SpO2: 94%    Constitutional: Alert and oriented. Eyes: Conjunctivae are normal. Head: Atraumatic. Nose: No congestion/rhinnorhea. Mouth/Throat: Mucous membranes are moist.  Cardiovascular: Normal rate, regular rhythm. Grossly normal heart sounds.  2+ radial pulses bilaterally. Respiratory: Normal respiratory effort.  No retractions. Lungs CTAB. Gastrointestinal: Soft and  nontender. No distention. Musculoskeletal: No lower extremity tenderness nor edema.  Large wound to right buttock with surrounding erythema and overlying eschar with purulent drainage. Neurologic:  Normal speech and language. No gross focal neurologic deficits are appreciated.    ED Results / Procedures / Treatments   Labs (all labs ordered are listed, but only abnormal results are displayed) Labs Reviewed  COMPREHENSIVE METABOLIC PANEL WITH GFR - Abnormal; Notable for the following components:      Result Value   Potassium 2.7 (*)    Chloride 96 (*)    Glucose, Bld 134 (*)    Albumin 3.0 (*)    AST 56 (*)    ALT 58 (*)    All other components within normal limits  CBC WITH DIFFERENTIAL/PLATELET - Abnormal; Notable for the following components:   Hemoglobin 11.8 (*)    All other components within normal limits  CULTURE, BLOOD (ROUTINE X 2)  CULTURE, BLOOD (ROUTINE X 2)  LACTIC ACID, PLASMA  MAGNESIUM   URINALYSIS, W/ REFLEX TO CULTURE (INFECTION SUSPECTED)    PROCEDURES:  Critical Care performed: No  Procedures   MEDICATIONS ORDERED IN ED:  Medications  lactated ringers  bolus 1,000 mL (has no administration in time range)  vancomycin (VANCOCIN) IVPB 1000 mg/200 mL premix (has no administration in time range)  piperacillin -tazobactam (ZOSYN ) IVPB 3.375 g (has no administration in time range)  potassium chloride  SA (KLOR-CON  M) CR tablet 40 mEq (has no administration in time range)  potassium chloride  10 mEq in 100 mL IVPB (has no administration in time range)  acetaminophen  (TYLENOL ) tablet 1,000 mg (has no administration in time range)     IMPRESSION / MDM / ASSESSMENT AND PLAN / ED COURSE  I reviewed the triage vital signs and the nursing notes.                              73 y.o. female with past medical history of diabetes, CKD, COPD, and chronic pain syndrome who presents to the ED complaining of wound to her right buttock, referred by general surgery for  debridement.  Patient's presentation is most consistent with acute presentation with potential threat to life or bodily function.  Differential diagnosis includes, but is not limited to, wound infection, cellulitis, osteomyelitis, sepsis, anemia, electrolyte abnormality, AKI.  Patient chronically ill but nontoxic-appearing and in no acute distress, vital signs remarkable for borderline fever at 100.3 as well as borderline hypotension.  Presentation concerning for sepsis, lactic acid within normal limits and blood cultures were drawn.  We will start on broad-spectrum antibiotics to cover for osteomyelitis.  Additional labs without significant anemia or leukocytosis, she does have significant hypokalemia but no AKI and LFTs are unremarkable.  We will add on magnesium  level and replete potassium.  Case discussed with hospitalist for admission.      FINAL CLINICAL IMPRESSION(S) / ED DIAGNOSES   Final diagnoses:  Wound of right buttock, initial encounter  Wound infection     Rx / DC Orders   ED Discharge Orders     None        Note:  This document was prepared using Dragon voice recognition software and may include unintentional dictation errors.   Willo Dunnings, MD 08/08/24 [phone]

## 2024-08-08 NOTE — Consult Note (Signed)
 CODE SEPSIS - PHARMACY COMMUNICATION  **Broad Spectrum Antibiotics should be administered within 1 hour of Sepsis diagnosis**  Time Code Sepsis Called/Page Received: 1901  Antibiotics Ordered: Zosyn  and vancomycin  Time of 1st antibiotic administration: 1928  Additional action taken by pharmacy: N/A  [name] ,PharmD Clinical Pharmacist  08/08/2024  7:02 PM

## 2024-08-08 NOTE — ED Notes (Signed)
 Critical Result: K 2.7  Siadecki, MD made aware

## 2024-08-08 NOTE — Sepsis Progress Note (Signed)
 Elink monitoring for the code sepsis protocol.

## 2024-08-08 NOTE — Progress Notes (Signed)
 Patient ID: Kim Graham, female   DOB: [birth year], 73 y.o.   MRN: [id number] CC: Right Buttock Wound History of Present Illness Kim Graham is a 73 y.o. female with past medical history as below who presents in consultation for right buttock wound.  The patient is known to me as she had a pilonidal cyst and underwent extensive resection of this.  At that time she last saw me the wound was healing well but not completely healed..  It appeared healthy with good granulation tissue and not any fibrinous tissue at the base.  She was instructed to follow-up in several weeks but never followed up with me.  In review of the chart it looks like she had several falls and left humeral neck fracture complicated by a radial nerve palsy's.  She reports today that she is living at a nursing rehab facility.  She says that she has noticed the pain in her right buttock for some time.  In review of the notes from the SNF that she is at they had noted this wound but she refused any bedside debridement or care of it.  She denies any systemic fevers or chills.  She denies any nausea or vomiting.  She is tolerating a diet.  She reports that she has not been walking.  Past Medical History Past Medical History:  Diagnosis Date   Acute cholecystitis 12/09/2021   Bile leak    Chronic kidney disease, stage 3a (HCC)    Chronic venous insufficiency    COPD (chronic obstructive pulmonary disease) (HCC)    Depression    Hep C w/o coma, chronic (HCC)    treated   History of kidney stones    Hypertension    Methadone  dependence (HCC)    Nephrolithiasis    Pre-diabetes    Sepsis secondary to UTI (HCC)    Thrombocytopenia        Past Surgical History:  Procedure Laterality Date   CYSTOSCOPY W/ URETERAL STENT PLACEMENT Right 12/09/2021   Procedure: CYSTOSCOPY WITH RETROGRADE PYELOGRAM/URETERAL STENT PLACEMENT;  Surgeon: Twylla Glendia BROCKS, MD;  Location: [facility];  Service: Urology;  Laterality: Right;    CYSTOSCOPY/URETEROSCOPY/HOLMIUM LASER/STENT PLACEMENT Bilateral 02/11/2022   Procedure: CYSTOSCOPY/URETEROSCOPY/HOLMIUM LASER/STENT PLACEMENT & RIGHT URETERAL STENT EXCHANGE;  Surgeon: Twylla Glendia BROCKS, MD;  Location: [facility];  Service: Urology;  Laterality: Bilateral;   CYSTOSCOPY/URETEROSCOPY/HOLMIUM LASER/STENT PLACEMENT Bilateral 05/19/2022   Procedure: CYSTOSCOPY/URETEROSCOPY/HOLMIUM LASER/STENT PLACEMENT;  Surgeon: Twylla Glendia BROCKS, MD;  Location: [facility];  Service: Urology;  Laterality: Bilateral;   ENDOSCOPIC RETROGRADE CHOLANGIOPANCREATOGRAPHY (ERCP) WITH PROPOFOL  N/A 12/11/2021   Procedure: ENDOSCOPIC RETROGRADE CHOLANGIOPANCREATOGRAPHY (ERCP) WITH PROPOFOL ;  Surgeon: Jinny Carmine, MD;  Location: [facility];  Service: Endoscopy;  Laterality: N/A;   ERCP N/A 03/10/2022   Procedure: ENDOSCOPIC RETROGRADE CHOLANGIOPANCREATOGRAPHY (ERCP);  Surgeon: Jinny Carmine, MD;  Location: [facility] ENDOSCOPY;  Service: Endoscopy;  Laterality: N/A;  Stent removal   ERCP N/A 06/23/2022   Procedure: ENDOSCOPIC RETROGRADE CHOLANGIOPANCREATOGRAPHY (ERCP);  Surgeon: Jinny Carmine, MD;  Location: [facility] ENDOSCOPY;  Service: Endoscopy;  Laterality: N/A;  stent removal   PILONIDAL CYST EXCISION N/A 12/29/2023   Procedure: CYST EXCISION PILONIDAL EXTENSIVE;  Surgeon: Marinda Jayson KIDD, MD;  Location: [facility];  Service: General;  Laterality: N/A;   PLACEMENT OF BREAST IMPLANTS Bilateral    TONSILLECTOMY      Allergies  Allergen Reactions   Doxycycline  Diarrhea    Very bad diarrhea    Current Outpatient Medications  Medication Sig Dispense Refill   acetaminophen  (  TYLENOL ) 325 MG tablet Take 2 tablets (650 mg total) by mouth every 6 (six) hours as needed for mild pain (pain score 1-3), fever or headache (or Fever >/= 101).     albuterol  (PROVENTIL ) (2.5 MG/3ML) 0.083% nebulizer solution Take 3 mLs (2.5 mg total) by nebulization every 6 (six) hours as needed for wheezing or shortness of breath. 150 mL 1    albuterol  (VENTOLIN  HFA) 108 (90 Base) MCG/ACT inhaler Inhale 2 puffs into the lungs every 6 (six) hours as needed for wheezing or shortness of breath. 8.5 each 6   ARIPiprazole  (ABILIFY ) 30 MG tablet Take 1 tablet (30 mg total) by mouth daily. 90 tablet 1   ascorbic acid  (VITAMIN C ) 500 MG tablet Take 1 tablet (500 mg total) by mouth daily.     budesonide -glycopyrrolate -formoterol  (BREZTRI  AEROSPHERE) 160-9-4.8 MCG/ACT AERO inhaler Inhale 2 puffs into the lungs 2 (two) times daily. 10.7 g 11   cyanocobalamin  (VITAMIN B12) 500 MCG tablet Take 1 tablet (500 mcg total) by mouth daily.     fluticasone  (FLONASE ) 50 MCG/ACT nasal spray Place 2 sprays into both nostrils daily. 16 g 6   iron  polysaccharides (NIFEREX) 150 MG capsule Take 1 capsule (150 mg total) by mouth daily.     methadone  (DOLOPHINE ) 10 MG/ML solution Take 10 mLs (100 mg total) by mouth daily. 10 mL 0   midodrine  (PROAMATINE ) 10 MG tablet Take 1 tablet (10 mg total) by mouth 3 (three) times daily with meals. Hold if SBP > 120 mmHg and/or HR <65     Multiple Vitamin (MULTI-VITAMIN) tablet Take 1 tablet by mouth daily.     Spacer/Aero-Holding Raguel FRENCH Use as directed Dx: COPD 1 each 0   Vitamin D , Ergocalciferol , (DRISDOL ) 1.25 MG (50000 UNIT) CAPS capsule Take 1 capsule (50,000 Units total) by mouth every 7 (seven) days.     No current facility-administered medications for this visit.    Family History Family History  Problem Relation Age of Onset   Diabetes Mother    Cancer Father        Pancreatic   Diabetes Maternal Aunt    Diabetes Maternal Grandmother    Dementia Maternal Grandfather    Heart disease Maternal Grandfather        Social History Social History   Tobacco Use   Smoking status: Former    Current packs/day: 0.00    Average packs/day: 0.5 packs/day for 49.0 years (24.5 ttl pk-yrs)    Types: Cigarettes    Start date: 12/13/1969    Quit date: 12/13/2018    Years since quitting: 5.6    Passive exposure:  Past   Smokeless tobacco: Never  Vaping Use   Vaping status: Never Used  Substance Use Topics   Alcohol use: Not Currently    Comment: On occasion   Drug use: Not Currently    Types: Heroin    Comment: revovering addict        ROS Full ROS of systems performed and is otherwise negative there than what is stated in the HPI  Physical Exam Blood pressure (!) 108/52, pulse 89, temperature 99.3 F (37.4 C), temperature source Oral, height 5' 4 (1.626 m), weight 114 lb (51.7 kg), SpO2 97%.  Alert and oriented x 3, normal work of breathing on room air, left arm in splint cast, gluteal cleft exam and buttock exam performed in the presence of a chaperone.  She has decreased mobility and was difficult to turn her all the way on her stomach  but her gluteal cleft wound does appear much larger than when I last saw her.  There is no evidence of infection of this and there is healthy tissue down to tissue overlying the bone although it is larger again than previous.  On her right buttock she has an area of eschar that is partially detached from the underlying tissue.  I was able to run my finger beneath this and there is purulent and necrotic tissue under this.  It is foul-smelling.   I have personally reviewed the patient's imaging and medical records.    Assessment    Patient with right buttock wound with eschar over this with necrotic tissue and purulent drainage.  Plan    She will need operative debridement of this.  We will send her to the hospital for admission for the wound.  I have called the ED charge nurse to notify them.  She should be admitted to the hospitalist team and I have already added her to the schedule for tomorrow for a debridement of sacral decubitus ulcer.  At this time recommend starting broad-spectrum antibiotics.  She needs to offload that right buttock.  Please once she is admitted keep her n.p.o. for operative intervention tomorrow.  A total of 50 minutes was spent  reviewing the patient's chart, performing history and physical and discussing treatment options and organizing treatment options for this patient.  Jayson MALVA Endow 08/08/2024, 3:05 PM

## 2024-08-08 NOTE — ED Triage Notes (Signed)
 First Nurse Note: Patient to ED via [facility] from [facility] for sacral wounds. Seen for possible debridement. VS WNL

## 2024-08-08 NOTE — ED Provider Triage Note (Signed)
 Emergency Medicine Provider Triage Evaluation Note  Kim Graham , a 73 y.o. female  was evaluated in triage.  Pt complains of wound to sacral area present for the past year or  more. Has been treating at home but feels pain has worsened and now has infection. Subjective fever.  Physical Exam  BP (!) 97/48 (BP Location: Left Arm)   Pulse 89   Temp 100.3 F (37.9 C) (Oral)   Resp 16   Wt 51.7 kg   SpO2 94%   BMI 19.56 kg/m  Gen:   Awake, no distress   Resp:  Normal effort  MSK:   Moves extremities without difficulty  Other:    Medical Decision Making  Medically screening exam initiated at 3:59 PM.  Appropriate orders placed.  Kim Graham was informed that the remainder of the evaluation will be completed by another provider, this initial triage assessment does not replace that evaluation, and the importance of remaining in the ED until their evaluation is complete.  Screening labs and workup started.   Kim Kirk NOVAK, FNP 08/08/24 2016

## 2024-08-09 ENCOUNTER — Inpatient Hospital Stay

## 2024-08-09 ENCOUNTER — Encounter
Admission: EM | Disposition: A | Discharge status: Skilled Nursing Facility | Payer: Self-pay | Source: Skilled Nursing Facility | Attending: Osteopathic Medicine

## 2024-08-09 ENCOUNTER — Ambulatory Visit: Admission: RE | Admit: 2024-08-09 | Source: Home / Self Care | Admitting: General Surgery

## 2024-08-09 ENCOUNTER — Encounter: Payer: Self-pay | Admitting: Internal Medicine

## 2024-08-09 ENCOUNTER — Other Ambulatory Visit: Payer: Self-pay

## 2024-08-09 DIAGNOSIS — L89154 Pressure ulcer of sacral region, stage 4: Secondary | ICD-10-CM

## 2024-08-09 DIAGNOSIS — S31000D Unspecified open wound of lower back and pelvis without penetration into retroperitoneum, subsequent encounter: Secondary | ICD-10-CM | POA: Diagnosis not present

## 2024-08-09 DIAGNOSIS — L98416 Non-pressure chronic ulcer of buttock with bone involvement without evidence of necrosis: Secondary | ICD-10-CM

## 2024-08-09 DIAGNOSIS — S31819A Unspecified open wound of right buttock, initial encounter: Secondary | ICD-10-CM

## 2024-08-09 LAB — CBC
HCT: 32.9 % — ABNORMAL LOW (ref 36.0–46.0)
Hemoglobin: 10.3 g/dL — ABNORMAL LOW (ref 12.0–15.0)
MCH: 26.1 pg (ref 26.0–34.0)
MCHC: 31.3 g/dL (ref 30.0–36.0)
MCV: 83.5 fL (ref 80.0–100.0)
Platelets: 225 K/uL (ref 150–400)
RBC: 3.94 MIL/uL (ref 3.87–5.11)
RDW: 15.1 % (ref 11.5–15.5)
WBC: 6 K/uL (ref 4.0–10.5)
nRBC: 0 % (ref 0.0–0.2)

## 2024-08-09 LAB — GLUCOSE, CAPILLARY: Glucose-Capillary: 87 mg/dL (ref 70–99)

## 2024-08-09 LAB — BASIC METABOLIC PANEL WITH GFR
Anion gap: 9 (ref 5–15)
BUN: 11 mg/dL (ref 8–23)
CO2: 29 mmol/L (ref 22–32)
Calcium: 8.4 mg/dL — ABNORMAL LOW (ref 8.9–10.3)
Chloride: 103 mmol/L (ref 98–111)
Creatinine, Ser: 0.65 mg/dL (ref 0.44–1.00)
GFR, Estimated: >60 mL/min (ref >60)
Glucose, Bld: 103 mg/dL — ABNORMAL HIGH (ref 70–99)
Potassium: 3.2 mmol/L — ABNORMAL LOW (ref 3.5–5.1)
Sodium: 141 mmol/L (ref 135–145)

## 2024-08-09 LAB — SEDIMENTATION RATE: Sed Rate: 39 mm/h — ABNORMAL HIGH (ref 0–30)

## 2024-08-09 LAB — APTT: aPTT: 33 s (ref 24–36)

## 2024-08-09 LAB — PROTIME-INR
INR: 1.2 (ref 0.8–1.2)
Prothrombin Time: 15.7 s — ABNORMAL HIGH (ref 11.4–15.2)

## 2024-08-09 LAB — C-REACTIVE PROTEIN: CRP: 6.6 mg/dL — ABNORMAL HIGH (ref <1.0)

## 2024-08-09 SURGERY — DEBRIDEMENT, WOUND
Anesthesia: General | Site: Buttocks

## 2024-08-09 MED ORDER — PIPERACILLIN-TAZOBACTAM 3.375 G IVPB
INTRAVENOUS | Status: AC
  Filled 2024-08-09: qty 50

## 2024-08-09 MED ORDER — POTASSIUM CHLORIDE CRYS ER 20 MEQ PO TBCR: 40.0000 meq | EXTENDED_RELEASE_TABLET | Freq: Once | ORAL | Status: DC

## 2024-08-09 MED ORDER — SUGAMMADEX SODIUM 200 MG/2ML IV SOLN
INTRAVENOUS | Status: DC | PRN
Start: 2024-08-09 — End: 2024-08-09
  Administered 2024-08-09: 200 mg via INTRAVENOUS

## 2024-08-09 MED ORDER — OXYCODONE HCL 5 MG PO TABS
ORAL_TABLET | ORAL | Status: AC
  Filled 2024-08-09: qty 1

## 2024-08-09 MED ORDER — DEXAMETHASONE SODIUM PHOSPHATE 10 MG/ML IJ SOLN
INTRAMUSCULAR | Status: DC | PRN
  Administered 2024-08-09: 10 mg via INTRAVENOUS

## 2024-08-09 MED ORDER — ACETAMINOPHEN 10 MG/ML IV SOLN
INTRAVENOUS | Status: DC | PRN
  Administered 2024-08-09: 1000 mg via INTRAVENOUS

## 2024-08-09 MED ORDER — ROCURONIUM BROMIDE 10 MG/ML (PF) SYRINGE
PREFILLED_SYRINGE | INTRAVENOUS | Status: AC
  Filled 2024-08-09: qty 10

## 2024-08-09 MED ORDER — BUPIVACAINE-EPINEPHRINE (PF) 0.5% -1:200000 IJ SOLN
INTRAMUSCULAR | Status: DC | PRN
Start: 2024-08-09 — End: 2024-08-09
  Administered 2024-08-09: 30 mL

## 2024-08-09 MED ORDER — BUPIVACAINE-EPINEPHRINE (PF) 0.5% -1:200000 IJ SOLN
INTRAMUSCULAR | Status: AC
  Filled 2024-08-09: qty 30

## 2024-08-09 MED ORDER — LIDOCAINE HCL (CARDIAC) PF 100 MG/5ML IV SOSY
PREFILLED_SYRINGE | INTRAVENOUS | Status: DC | PRN
Start: 2024-08-09 — End: 2024-08-09
  Administered 2024-08-09: 40 mg via INTRAVENOUS

## 2024-08-09 MED ORDER — VANCOMYCIN HCL IN DEXTROSE 1-5 GM/200ML-% IV SOLN
1000.0000 mg | INTRAVENOUS | Status: DC
  Administered 2024-08-09 – 2024-08-11 (×3): 1000 mg via INTRAVENOUS
  Filled 2024-08-09 (×4): qty 200

## 2024-08-09 MED ORDER — PROPOFOL 10 MG/ML IV BOLUS
INTRAVENOUS | Status: DC | PRN
  Administered 2024-08-09: 80 mg via INTRAVENOUS

## 2024-08-09 MED ORDER — OXYCODONE HCL 5 MG/5ML PO SOLN: 5.0000 mg | Freq: Once | ORAL | Status: AC | PRN

## 2024-08-09 MED ORDER — GLYCOPYRROLATE 0.2 MG/ML IJ SOLN
INTRAMUSCULAR | Status: AC
  Filled 2024-08-09: qty 1

## 2024-08-09 MED ORDER — ONDANSETRON HCL 4 MG/2ML IJ SOLN
INTRAMUSCULAR | Status: AC
  Filled 2024-08-09: qty 2

## 2024-08-09 MED ORDER — FENTANYL CITRATE (PF) 100 MCG/2ML IJ SOLN
INTRAMUSCULAR | Status: DC | PRN
  Administered 2024-08-09: 25 ug via INTRAVENOUS
  Administered 2024-08-09: 50 ug via INTRAVENOUS
  Administered 2024-08-09: 25 ug via INTRAVENOUS

## 2024-08-09 MED ORDER — 0.9 % SODIUM CHLORIDE (POUR BTL) OPTIME
TOPICAL | Status: DC | PRN
  Administered 2024-08-09: 500 mL

## 2024-08-09 MED ORDER — FENTANYL CITRATE (PF) 100 MCG/2ML IJ SOLN
25.0000 ug | INTRAMUSCULAR | Status: DC | PRN
  Administered 2024-08-09: 25 ug via INTRAVENOUS

## 2024-08-09 MED ORDER — METHADONE HCL 10 MG/ML PO CONC
50.0000 mg | Freq: Two times a day (BID) | ORAL | Status: DC
  Administered 2024-08-09 – 2024-08-17 (×17): 50 mg via ORAL
  Filled 2024-08-09 (×18): qty 5

## 2024-08-09 MED ORDER — ACETAMINOPHEN 10 MG/ML IV SOLN
INTRAVENOUS | Status: AC
  Filled 2024-08-09: qty 100

## 2024-08-09 MED ORDER — DEXAMETHASONE SODIUM PHOSPHATE 10 MG/ML IJ SOLN
INTRAMUSCULAR | Status: AC
  Filled 2024-08-09: qty 1

## 2024-08-09 MED ORDER — FENTANYL CITRATE (PF) 100 MCG/2ML IJ SOLN
INTRAMUSCULAR | Status: AC
  Filled 2024-08-09: qty 2

## 2024-08-09 MED ORDER — PHENYLEPHRINE 80 MCG/ML (10ML) SYRINGE FOR IV PUSH (FOR BLOOD PRESSURE SUPPORT)
PREFILLED_SYRINGE | INTRAVENOUS | Status: DC | PRN
  Administered 2024-08-09: 80 ug via INTRAVENOUS

## 2024-08-09 MED ORDER — ONDANSETRON HCL 4 MG/2ML IJ SOLN
INTRAMUSCULAR | Status: DC | PRN
  Administered 2024-08-09: 4 mg via INTRAVENOUS

## 2024-08-09 MED ORDER — ROCURONIUM BROMIDE 100 MG/10ML IV SOLN
INTRAVENOUS | Status: DC | PRN
Start: 2024-08-09 — End: 2024-08-09
  Administered 2024-08-09: 40 mg via INTRAVENOUS

## 2024-08-09 MED ORDER — LIDOCAINE HCL (PF) 2 % IJ SOLN
INTRAMUSCULAR | Status: AC
  Filled 2024-08-09: qty 5

## 2024-08-09 MED ORDER — PROPOFOL 10 MG/ML IV BOLUS
INTRAVENOUS | Status: AC
  Filled 2024-08-09: qty 20

## 2024-08-09 MED ORDER — ONDANSETRON HCL 4 MG/2ML IJ SOLN: 4.0000 mg | Freq: Once | INTRAMUSCULAR | Status: DC | PRN

## 2024-08-09 MED ORDER — OXYCODONE HCL 5 MG PO TABS
5.0000 mg | ORAL_TABLET | Freq: Once | ORAL | Status: AC | PRN
  Administered 2024-08-09: 5 mg via ORAL

## 2024-08-09 SURGICAL SUPPLY — 24 items
BNDG GAUZE DERMACEA FLUFF 4 (GAUZE/BANDAGES/DRESSINGS) IMPLANT
CHLORAPREP W/TINT 26 (MISCELLANEOUS) IMPLANT
DRAPE LAPAROTOMY 100X77 ABD (DRAPES) ×1 IMPLANT
ELECTRODE REM PT RTRN 9FT ADLT (ELECTROSURGICAL) ×1 IMPLANT
GAUZE 4X4 16PLY ~~LOC~~+RFID DBL (SPONGE) IMPLANT
GAUZE SPONGE 4X4 12PLY STRL (GAUZE/BANDAGES/DRESSINGS) IMPLANT
GLOVE BIOGEL PI IND STRL 7.5 (GLOVE) ×1 IMPLANT
GLOVE SURG SYN 7.0 PF PI (GLOVE) ×1 IMPLANT
GOWN STRL REUS W/ TWL LRG LVL3 (GOWN DISPOSABLE) ×2 IMPLANT
MANIFOLD NEPTUNE II (INSTRUMENTS) ×1 IMPLANT
NDL HYPO 22X1.5 SAFETY MO (MISCELLANEOUS) IMPLANT
NEEDLE HYPO 22X1.5 SAFETY MO (MISCELLANEOUS) ×1 IMPLANT
NS IRRIG 500ML POUR BTL (IV SOLUTION) IMPLANT
PACK BASIN MINOR ARMC (MISCELLANEOUS) ×1 IMPLANT
PAD ABD DERMACEA PRESS 5X9 (GAUZE/BANDAGES/DRESSINGS) IMPLANT
SOLUTION PREP PVP 2OZ (MISCELLANEOUS) IMPLANT
SPONGE T-LAP 18X18 ~~LOC~~+RFID (SPONGE) ×1 IMPLANT
SUT VIC AB 3-0 SH 27X BRD (SUTURE) IMPLANT
SUTURE EHLN 3-0 FS-10 30 BLK (SUTURE) IMPLANT
SUTURE MNCRL 4-0 27XMF (SUTURE) IMPLANT
SWAB CULTURE AMIES ANAERIB BLU (MISCELLANEOUS) IMPLANT
SYR 10ML LL (SYRINGE) IMPLANT
TAPE PAPER 3X10 WHT MICROPORE (GAUZE/BANDAGES/DRESSINGS) IMPLANT
TRAP FLUID SMOKE EVACUATOR (MISCELLANEOUS) ×1 IMPLANT

## 2024-08-09 NOTE — H&P (Signed)
 No changes to below H and P, full r/b/a discussed with patient  CC: Right Buttock Wound History of Present Illness Kim Graham is a 73 y.o. female with past medical history as below who presents in consultation for right buttock wound.  The patient is known to me as she had a pilonidal cyst and underwent extensive resection of this.  At that time she last saw me the wound was healing well but not completely healed..  It appeared healthy with good granulation tissue and not any fibrinous tissue at the base.  She was instructed to follow-up in several weeks but never followed up with me.  In review of the chart it looks like she had several falls and left humeral neck fracture complicated by a radial nerve palsy's.  She reports today that she is living at a nursing rehab facility.  She says that she has noticed the pain in her right buttock for some time.  In review of the notes from the SNF that she is at they had noted this wound but she refused any bedside debridement or care of it.   She denies any systemic fevers or chills.  She denies any nausea or vomiting.  She is tolerating a diet.  She reports that she has not been walking.   Past Medical History     Past Medical History:  Diagnosis Date   Acute cholecystitis 12/09/2021   Bile leak     Chronic kidney disease, stage 3a (HCC)     Chronic venous insufficiency     COPD (chronic obstructive pulmonary disease) (HCC)     Depression     Hep C w/o coma, chronic (HCC)      treated   History of kidney stones     Hypertension     Methadone  dependence (HCC)     Nephrolithiasis     Pre-diabetes     Sepsis secondary to UTI (HCC)     Thrombocytopenia                   Past Surgical History:  Procedure Laterality Date   CYSTOSCOPY W/ URETERAL STENT PLACEMENT Right 12/09/2021    Procedure: CYSTOSCOPY WITH RETROGRADE PYELOGRAM/URETERAL STENT PLACEMENT;  Surgeon: Twylla Glendia BROCKS, MD;  Location: [facility];  Service: Urology;  Laterality:  Right;   CYSTOSCOPY/URETEROSCOPY/HOLMIUM LASER/STENT PLACEMENT Bilateral 02/11/2022    Procedure: CYSTOSCOPY/URETEROSCOPY/HOLMIUM LASER/STENT PLACEMENT & RIGHT URETERAL STENT EXCHANGE;  Surgeon: Twylla Glendia BROCKS, MD;  Location: [facility];  Service: Urology;  Laterality: Bilateral;   CYSTOSCOPY/URETEROSCOPY/HOLMIUM LASER/STENT PLACEMENT Bilateral 05/19/2022    Procedure: CYSTOSCOPY/URETEROSCOPY/HOLMIUM LASER/STENT PLACEMENT;  Surgeon: Twylla Glendia BROCKS, MD;  Location: [facility];  Service: Urology;  Laterality: Bilateral;   ENDOSCOPIC RETROGRADE CHOLANGIOPANCREATOGRAPHY (ERCP) WITH PROPOFOL  N/A 12/11/2021    Procedure: ENDOSCOPIC RETROGRADE CHOLANGIOPANCREATOGRAPHY (ERCP) WITH PROPOFOL ;  Surgeon: Jinny Carmine, MD;  Location: [facility] ENDOSCOPY;  Service: Endoscopy;  Laterality: N/A;   ERCP N/A 03/10/2022    Procedure: ENDOSCOPIC RETROGRADE CHOLANGIOPANCREATOGRAPHY (ERCP);  Surgeon: Jinny Carmine, MD;  Location: [facility] ENDOSCOPY;  Service: Endoscopy;  Laterality: N/A;  Stent removal   ERCP N/A 06/23/2022    Procedure: ENDOSCOPIC RETROGRADE CHOLANGIOPANCREATOGRAPHY (ERCP);  Surgeon: Jinny Carmine, MD;  Location: [facility] ENDOSCOPY;  Service: Endoscopy;  Laterality: N/A;  stent removal   PILONIDAL CYST EXCISION N/A 12/29/2023    Procedure: CYST EXCISION PILONIDAL EXTENSIVE;  Surgeon: Marinda Jayson KIDD, MD;  Location: [facility] ORS;  Service: General;  Laterality: N/A;   PLACEMENT OF BREAST IMPLANTS Bilateral  TONSILLECTOMY              Allergies       Allergies  Allergen Reactions   Doxycycline  Diarrhea      Very bad diarrhea              Current Outpatient Medications  Medication Sig Dispense Refill   acetaminophen  (TYLENOL ) 325 MG tablet Take 2 tablets (650 mg total) by mouth every 6 (six) hours as needed for mild pain (pain score 1-3), fever or headache (or Fever >/= 101).       albuterol  (PROVENTIL ) (2.5 MG/3ML) 0.083% nebulizer solution Take 3 mLs (2.5 mg total) by nebulization every 6 (six) hours as  needed for wheezing or shortness of breath. 150 mL 1   albuterol  (VENTOLIN  HFA) 108 (90 Base) MCG/ACT inhaler Inhale 2 puffs into the lungs every 6 (six) hours as needed for wheezing or shortness of breath. 8.5 each 6   ARIPiprazole  (ABILIFY ) 30 MG tablet Take 1 tablet (30 mg total) by mouth daily. 90 tablet 1   ascorbic acid  (VITAMIN C ) 500 MG tablet Take 1 tablet (500 mg total) by mouth daily.       budesonide -glycopyrrolate -formoterol  (BREZTRI  AEROSPHERE) 160-9-4.8 MCG/ACT AERO inhaler Inhale 2 puffs into the lungs 2 (two) times daily. 10.7 g 11   cyanocobalamin  (VITAMIN B12) 500 MCG tablet Take 1 tablet (500 mcg total) by mouth daily.       fluticasone  (FLONASE ) 50 MCG/ACT nasal spray Place 2 sprays into both nostrils daily. 16 g 6   iron  polysaccharides (NIFEREX) 150 MG capsule Take 1 capsule (150 mg total) by mouth daily.       methadone  (DOLOPHINE ) 10 MG/ML solution Take 10 mLs (100 mg total) by mouth daily. 10 mL 0   midodrine  (PROAMATINE ) 10 MG tablet Take 1 tablet (10 mg total) by mouth 3 (three) times daily with meals. Hold if SBP > 120 mmHg and/or HR <65       Multiple Vitamin (MULTI-VITAMIN) tablet Take 1 tablet by mouth daily.       Spacer/Aero-Holding Raguel FRENCH Use as directed Dx: COPD 1 each 0   Vitamin D , Ergocalciferol , (DRISDOL ) 1.25 MG (50000 UNIT) CAPS capsule Take 1 capsule (50,000 Units total) by mouth every 7 (seven) days.          No current facility-administered medications for this visit.        Family History      Family History  Problem Relation Age of Onset   Diabetes Mother     Cancer Father          Pancreatic   Diabetes Maternal Aunt     Diabetes Maternal Grandmother     Dementia Maternal Grandfather     Heart disease Maternal Grandfather              Social History Social History  Social History         Tobacco Use   Smoking status: Former      Current packs/day: 0.00      Average packs/day: 0.5 packs/day for 49.0 years (24.5 ttl  pk-yrs)      Types: Cigarettes      Start date: 12/13/1969      Quit date: 12/13/2018      Years since quitting: 5.6      Passive exposure: Past   Smokeless tobacco: Never  Vaping Use   Vaping status: Never Used  Substance Use Topics   Alcohol use: Not Currently  Comment: On occasion   Drug use: Not Currently      Types: Heroin      Comment: revovering addict            ROS Full ROS of systems performed and is otherwise negative there than what is stated in the HPI   Physical Exam Blood pressure (!) 108/52, pulse 89, temperature 99.3 F (37.4 C), temperature source Oral, height 5' 4 (1.626 m), weight 114 lb (51.7 kg), SpO2 97%.   Alert and oriented x 3, normal work of breathing on room air, left arm in splint cast, gluteal cleft exam and buttock exam performed in the presence of a chaperone.  She has decreased mobility and was difficult to turn her all the way on her stomach but her gluteal cleft wound does appear much larger than when I last saw her.  There is no evidence of infection of this and there is healthy tissue down to tissue overlying the bone although it is larger again than previous.  On her right buttock she has an area of eschar that is partially detached from the underlying tissue.  I was able to run my finger beneath this and there is purulent and necrotic tissue under this.  It is foul-smelling.     I have personally reviewed the patient's imaging and medical records.     Assessment Assessment Patient with right buttock wound with eschar over this with necrotic tissue and purulent drainage.   Plan Plan She will need operative debridement of this.  We will send her to the hospital for admission for the wound.  I have called the ED charge nurse to notify them.  She should be admitted to the hospitalist team and I have already added her to the schedule for tomorrow for a debridement of sacral decubitus ulcer.  At this time recommend starting broad-spectrum  antibiotics.  She needs to offload that right buttock.  Please once she is admitted keep her n.p.o. for operative intervention tomorrow.   A total of 50 minutes was spent reviewing the patient's chart, performing history and physical and discussing treatment options and organizing treatment options for this patient.   Jayson MALVA Endow 08/08/2024, 3:05 PM

## 2024-08-09 NOTE — Anesthesia Procedure Notes (Addendum)
 Procedure Name: Intubation Date/Time: 08/09/2024 11:50 AM  Performed by: Christabella Alvira, CRNAPre-anesthesia Checklist: Patient identified, Emergency Drugs available, Suction available and Patient being monitored Patient Re-evaluated:Patient Re-evaluated prior to induction Oxygen Delivery Method: Circle System Utilized Preoxygenation: Pre-oxygenation with 100% oxygen Induction Type: IV induction Ventilation: Mask ventilation without difficulty Laryngoscope Size: Mac and 3 Grade View: Grade II Tube type: Oral Tube size: 6.5 mm Number of attempts: 2 (first attempt DL MAC 3 with grade 2 view. Very small mouth opening and very poor dentition. Masked in between attempts and switched to McGrath blade 3.) Airway Equipment and Method: Stylet and Oral airway Placement Confirmation: ETT inserted through vocal cords under direct vision, positive ETCO2 and breath sounds checked- equal and bilateral Secured at: 20 cm Tube secured with: Tape Dental Injury: Teeth and Oropharynx as per pre-operative assessment  Comments: Head and neck midline. Lips, tongue and teeth unchanged.

## 2024-08-09 NOTE — Op Note (Signed)
 Operative note  Preoperative diagnosis: Unstageable right buttock wound and sacral decubitus ulcer Postoperative diagnosis: Sacral decubitus ulcer down to bone, right buttock ulcer down to bone EBL: 15 cc Surgeon: Jayson Endow, MD Procedure: Excisional debridement of sacral decubitus ulcer and right buttock wound measuring 11 cm x 11 cm x 6 cm with debridement through skin, subcutaneous tissue, muscle, fascia and bone  After informed consent was obtained the patient was brought to the operating room and intubated on her hospital bed.  She was then placed prone on the operating room table.  All pressure points were padded.  Her buttock and sacrum were then prepped and draped in the usual sterile fashion.  A surgical timeout was called identifying correct patient, site, side and procedure.  Starting on her right buttock there was an area of eschar with overlying necrotic tissue.  This was sharply debrided with scissors and deep tissue was taken for biopsy and culture.  The subcutaneous tissue was debrided and this was down to muscle.  The muscle was partially necrotic down into the sacrum.  This did connect her with a sacral decubitus ulcer that she previously had.  The skin bridges did appear healthy so I elected to leave this in place.  I then turned my attention to the sacral decubitus ulcer and there was necrotic tissue at the wound base.  This was debrided back into bone.  A bone biopsy was taken for culture.  All necrotic tissue was sharply debrided with scissors and a scalpel back to bleeding healthy tissue.  A total area of debridement measured approximately 11 cm x 11 cm x 6 cm.  Once I debrided all necrotic tissue back to bleeding tissue hemostasis was then obtained.  The wound was then irrigated with warm saline solution.  It was then packed with wet Kerlix and covered with ABD pads.  Mesh panties were then placed.  The patient was then transferred back to the hospital bed and extubated.  She was  taken to the PACU in good condition.  Prior to termination of the procedure all sponge and instrument counts were correct x 2.

## 2024-08-09 NOTE — Consult Note (Addendum)
 WOC Nurse Consult Note: patient seen in office by Dr. Marinda surgeon who plans to do surgical debridement of coccyx and R buttock unstageable wounds in OR 08/09/2024  Reason for Consult: multiple wounds  Wound type: 1.  L heel Deep Tissue Pressure Injury purple maroon discoloration  2  R heel developing DTPI combination of purple maroon discoloration and serum filled blister   3.  Unstageable Pressure Injury Coccyx and R buttock thick black eschar  Pressure Injury POA: Yes Measurement: see nursing flowsheet  Wound bed:as above  Drainage (amount, consistency, odor) purulent foul smelling from coccyx/R buttock wounds  Periwound: Dressing procedure/placement/frequency: Cleanse B heels with soap and water , dry and apply Xeroform gauze ([place] [phone]) to wound beds daily and secure with silicone foam or Kerlix roll gauze. Place B feet in Prevalon boots Soila [phone]) to offload pressure.    Will not write any orders for coccyx/R buttock wound as Dr. Marinda plans to debride wounds today in OR and will manage those wounds postop.    Patient should be placed on a low air loss mattress for pressure redistribution and moisture management.   POC discussed with bedside nurse. WOc team will not follow.  Reconsult if further needs arise.   Thank you,    Powell Bar MSN, RN-BC, [employer]

## 2024-08-09 NOTE — Anesthesia Postprocedure Evaluation (Signed)
 Anesthesia Post Note  Patient: Kim Graham  Procedure(s) Performed: Excisional debridement of sacral decubitus ulcer and buttocks ulcer (Buttocks)  Patient location during evaluation: PACU Anesthesia Type: General Level of consciousness: awake Pain management: satisfactory to patient Vital Signs Assessment: post-procedure vital signs reviewed and stable Cardiovascular status: blood pressure returned to baseline Anesthetic complications: no   No notable events documented.   Last Vitals:  Vitals:   08/09/24 1315 08/09/24 1330  BP:  112/60  Pulse:  98  Resp:  17  Temp: 36.9 C   SpO2:  100%    Last Pain:  Vitals:   08/09/24 1330  TempSrc:   PainSc: 5                  VAN STAVEREN,Henya Aguallo

## 2024-08-09 NOTE — Progress Notes (Signed)
 PROGRESS NOTE    Kim Graham   FMW:[id number] DOB: January 29, 1951  DOA: 08/08/2024 Date of Service: 08/09/24 which is hospital day 1  PCP: Vicci Duwaine SQUIBB, DO    Hospital course / significant events:   HPI: Kim Graham is a 73 y.o. female with medical history significant of chronic pain on methadone , COPD, diet-controled type 2 diabetes, history of HCV status posttreatment, dCHF, depression, CKD-3A, bile leakage, chronic venous insufficiency, kidney stone, chronic hypotension on midodrine , who presents with sacral wound infection. Pt has hx of pilonidal cyst and underwent extensive resection in the past, with a chronic sacral wound in right buttock area. Pt is following up with Dr. Marinda of surgery. Pt states that she has worsening pain in the right buttock wound recently.  The pain is constant, aching, severe, nonradiating, aggravated by movement.  Pt was seen by Dr. Marinda today who recommended hospital admission and operative debridement.   09/30: admitted to hospitalist service w/ surgical consult, plan for OR tomorrow for debridement. IV abx  10/01: to OR today. Cultures pending.      Consultants:  General surgery  Procedures/Surgeries: 08/09/24 Excisional debridement of sacral decubitus ulcer and buttocks ulcer w/ Dr Marinda      ASSESSMENT & PLAN:   Sacral wound with infection, not meeting sepsis criteria POD0 s/p debridement sacral/buttock wound  Methadone  dependence due to chronic pain Vancomycin and zosyn  Blood cultures x 2  Wound cultures  ESR and CRP wound care consult continue home methadone  for chronic pain As needed oxycodone  and ibuprofen  for pain   Chronic hypotension:  midodrine  10 mg 3 times daily   Hypokalemia Replace as needed Monitor BMP  Chronic diastolic CHF (congestive heart failure) not in exacerbation:  2D echo 07/29/2020 showed EF 60 to 65% with grade 1 diastolic dysfunction.  Patient does not have SOB, only has trace leg edema, CHF  seems to be compensated. Hold Lasix  due to soft blood pressure Check BNP - minimal elevation into 200s I&O, daily weights   Chronic kidney disease, stage 3a:   GFR> 60 today Follow-up with BMP   Chronic obstructive pulmonary disease : Stable bronchodilators and prn Mucinex   Iron  deficiency anemia: Hemoglobin stable 11.8 Continue iron  supplement   Hep C w/o coma, chronic S/p treatment.  AST 56, ALT 58, total bilirubin 0.6  Avoid using Tylenol  / other hepatotoxins    Depression, recurrent Current continue home medications, Abilify       No concerns based on BMI: Body mass index is 19.55 kg/m.[name] Significantly low or high BMI is associated with higher medical risk.  Underweight - under 18  overweight - 25 to 29 obese - 30 or more Class 1 obesity: BMI of 30.0 to 34 Class 2 obesity: BMI of 35.0 to 39 Class 3 obesity: BMI of 40.0 to 49 Super Morbid Obesity: BMI 50-59 Super-super Morbid Obesity: BMI 60+ Healthy nutrition and physical activity advised as adjunct to other disease management and risk reduction treatments    DVT prophylaxis: heparin IV fluids: LR 75 cc/h can dc when eating  continuous IV fluids  Nutrition: resuem diet postop heart healthy Central lines / other devices: none  Code Status: FULL CODE ACP documentation reviewed:  none on file in [facility]  TOC needs: TBD Medical barriers to dispo: await cultures to guide abx. Expected medical readiness for discharge next 1-2 days.              Subjective / Brief ROS:  Patient reports feeling okay Examined  in PACU postop Denies CP/SOB.  Pain controlled.  Denies new weakness. .    Family Communication: none at this time     Objective Findings:  Vitals:   08/09/24 1245 08/09/24 1247 08/09/24 1315 08/09/24 1330  BP: (!) 138/51 (!) 138/51  112/60  Pulse: 80 78  98  Resp: 16 (!) 22  17  Temp: 98.4 F (36.9 C)  98.5 F (36.9 C)   TempSrc:      SpO2: 100% 100%  100%  Weight:      Height:         Intake/Output Summary (Last 24 hours) at 08/09/2024 1357 Last data filed at 08/09/2024 1332 Gross per 24 hour  Intake 1740.44 ml  Output 15 ml  Net 1725.44 ml   Filed Weights   08/08/24 1557 08/09/24 0548  Weight: 51.7 kg 51.7 kg    Examination:  Physical Exam Constitutional:      General: She is not in acute distress. Cardiovascular:     Rate and Rhythm: Normal rate and regular rhythm.  Pulmonary:     Effort: Pulmonary effort is normal.     Breath sounds: Normal breath sounds.  Abdominal:     Palpations: Abdomen is soft.  Skin:    General: Skin is warm and dry.  Neurological:     Mental Status: She is alert.          Scheduled Medications:   ARIPiprazole   30 mg Oral Daily   ascorbic acid   500 mg Oral Daily   cyanocobalamin   500 mcg Oral Daily   heparin  5,000 Units Subcutaneous Q8H   iron  polysaccharides  150 mg Oral Daily   methadone   50 mg Oral Q12H   midodrine   10 mg Oral TID WC   multivitamin with minerals  1 tablet Oral Daily   potassium chloride   40 mEq Oral Once    Continuous Infusions:  lactated ringers  75 mL/hr at 08/09/24 1137   piperacillin -tazobactam (ZOSYN )  IV 12.5 mL/hr at 08/09/24 0455   vancomycin      PRN Medications:  albuterol , dextromethorphan-guaiFENesin, ibuprofen , ondansetron  (ZOFRAN ) IV, oxyCODONE   Antimicrobials from admission:  Anti-infectives (From admission, onward)    Start     Dose/Rate Route Frequency Ordered Stop   08/09/24 2000  vancomycin (VANCOREADY) IVPB 750 mg/150 mL  Status:  Discontinued        750 mg 150 mL/hr over 60 Minutes Intravenous Every 24 hours 08/08/24 2003 08/09/24 0736   08/09/24 2000  vancomycin (VANCOCIN) IVPB 1000 mg/200 mL premix        1,000 mg 200 mL/hr over 60 Minutes Intravenous Every 24 hours 08/09/24 0736     08/09/24 0330  piperacillin -tazobactam (ZOSYN ) IVPB 3.375 g        3.375 g 12.5 mL/hr over 240 Minutes Intravenous Every 8 hours 08/08/24 1955     08/08/24 2000  vancomycin  (VANCOREADY) IVPB 1250 mg/250 mL        1,250 mg 166.7 mL/hr over 90 Minutes Intravenous  Once 08/08/24 1954 08/08/24 2235   08/08/24 1915  vancomycin (VANCOCIN) IVPB 1000 mg/200 mL premix  Status:  Discontinued        1,000 mg 200 mL/hr over 60 Minutes Intravenous  Once 08/08/24 1901 08/08/24 1954   08/08/24 1915  piperacillin -tazobactam (ZOSYN ) IVPB 3.375 g        3.375 g 100 mL/hr over 30 Minutes Intravenous  Once 08/08/24 1901 08/08/24 2017           Data  Reviewed:  I have personally reviewed the following...  CBC: Recent Labs  Lab 08/08/24 1618 08/09/24 0508  WBC 8.4 6.0  NEUTROABS 6.3  --   HGB 11.8* 10.3*  HCT 37.9 32.9*  MCV 83.7 83.5  PLT 288 225   Basic Metabolic Panel: Recent Labs  Lab 08/08/24 1618 08/09/24 0508  NA 140 141  K 2.7* 3.2*  CL 96* 103  CO2 29 29  GLUCOSE 134* 103*  BUN 13 11  CREATININE 0.91 0.65  CALCIUM 8.9 8.4*  MG 2.1  --   PHOS 3.1  --    GFR: Estimated Creatinine Clearance: 51.9 mL/min (by C-G formula based on SCr of 0.65 mg/dL). Liver Function Tests: Recent Labs  Lab 08/08/24 1618  AST 56*  ALT 58*  ALKPHOS 99  BILITOT 0.6  PROT 6.9  ALBUMIN 3.0*   No results for input(s): LIPASE, AMYLASE in the last 168 hours. No results for input(s): AMMONIA in the last 168 hours. Coagulation Profile: Recent Labs  Lab 08/09/24 0508  INR 1.2   Cardiac Enzymes: No results for input(s): CKTOTAL, CKMB, CKMBINDEX, TROPONINI in the last 168 hours. BNP (last 3 results) No results for input(s): PROBNP in the last 8760 hours. HbA1C: No results for input(s): HGBA1C in the last 72 hours. CBG: Recent Labs  Lab 08/09/24 0813  GLUCAP 87   Lipid Profile: No results for input(s): CHOL, HDL, LDLCALC, TRIG, CHOLHDL, LDLDIRECT in the last 72 hours. Thyroid  Function Tests: No results for input(s): TSH, T4TOTAL, FREET4, T3FREE, THYROIDAB in the last 72 hours. Anemia Panel: No results for  input(s): VITAMINB12, FOLATE, FERRITIN, TIBC, IRON , RETICCTPCT in the last 72 hours. Most Recent Urinalysis On File:     Component Value Date/Time   COLORURINE AMBER (A) 05/17/2024 1934   APPEARANCEUR CLEAR (A) 05/17/2024 1934   APPEARANCEUR Cloudy (A) 04/16/2023 1424   LABSPEC 1.019 05/17/2024 1934   PHURINE 6.0 05/17/2024 1934   GLUCOSEU NEGATIVE 05/17/2024 1934   HGBUR LARGE (A) 05/17/2024 1934   BILIRUBINUR NEGATIVE 05/17/2024 1934   BILIRUBINUR Negative 04/16/2023 1424   KETONESUR 5 (A) 05/17/2024 1934   PROTEINUR 100 (A) 05/17/2024 1934   NITRITE NEGATIVE 05/17/2024 1934   LEUKOCYTESUR TRACE (A) 05/17/2024 1934   Sepsis Labs: @LABRCNTIP (procalcitonin:4,lacticidven:4) Microbiology: Recent Results (from the past 240 hours)  Culture, blood (routine x 2)     Status: None (Preliminary result)   Collection Time: 08/08/24  6:55 PM   Specimen: BLOOD  Result Value Ref Range Status   Specimen Description   Final    BLOOD RIGHT ANTECUBITAL Performed at [facility], [address] [place], [place] [address]    Special Requests   Final    BOTTLES DRAWN AEROBIC AND ANAEROBIC Blood Culture results may not be optimal due to an inadequate volume of blood received in culture bottles Performed at [facility], [address] [place], [place] [address]    Culture   Final    NO GROWTH < 24 HOURS Performed at [facility], [address], [place], [place] [address]    Report Status PENDING  Incomplete  Culture, blood (routine x 2)     Status: None (Preliminary result)   Collection Time: 08/08/24  7:20 PM   Specimen: BLOOD  Result Value Ref Range Status   Specimen Description   Final    BLOOD BLOOD RIGHT HAND Performed at [facility], [address], [place], [place] [address]    Special Requests   Final  BOTTLES DRAWN AEROBIC AND ANAEROBIC Blood Culture results may not be optimal due to an inadequate volume of blood received in  culture bottles Performed at [facility], [address], [place], [place] [address]    Culture   Final    NO GROWTH < 24 HOURS Performed at [facility], [address], [place], [place] [address]    Report Status PENDING  Incomplete      Radiology Studies last 3 days: No results found.        Kymiah Araiza, DO [facility] 08/09/2024, 1:57 PM    Dictation software may have been used to generate the above note. Typos may occur and escape review in typed/dictated notes. Please contact Dr Marsa directly for clarity if needed.  Staff may message me via secure chat in Epic  but this may not receive an immediate response,  please page me for urgent matters!  If 7PM-7AM, please contact night coverage [email or web address]

## 2024-08-09 NOTE — Consult Note (Signed)
 WOC Nurse Consult Note: Consult requested for sacrum/buttocks. This was already performed  earlier this morning, refer to [facility] consult note at [phone].  According to surgical team notes, She should be admitted to the hospitalist team and I have already added her to the schedule for tomorrow for a debridement of sacral decubitus ulcer.' Please refer to surgical team for further plan of care regarding sacrum/buttocks wounds.   Please re-consult if further assistance is needed.  Thank-you,  Stephane Fought MSN, RN, CWOCN, CWCN-AP, CNS Contact Mon-Fri 0700-1500: [phone]

## 2024-08-09 NOTE — Hospital Course (Addendum)
 Hospital course / significant events:   HPI: Kim Graham is a 73 y.o. female with medical history significant of chronic pain on methadone , COPD, diet-controled type 2 diabetes, history of HCV status posttreatment, dCHF, depression, CKD-3A, bile leakage, chronic venous insufficiency, kidney stone, chronic hypotension on midodrine , who presents with sacral wound infection. Pt has hx of pilonidal cyst and underwent extensive resection in the past, with a chronic sacral wound in right buttock area. Pt is following up with Dr. Marinda of surgery. Pt states that she has worsening pain in the right buttock wound recently.  The pain is constant, aching, severe, nonradiating, aggravated by movement.  Pt was seen by Dr. Marinda today who recommended hospital admission and operative debridement.   09/30: admitted to hospitalist service w/ surgical consult, plan for OR tomorrow for debridement. IV abx  10/01: to OR today. Excisional debridement of sacral decubitus ulcer and right buttock wound measuring 11 cm x 11 cm x 6 cm with debridement through skin, subcutaneous tissue, muscle, fascia and bone. Cultures and biopsies R buttock tissue and sacral bone pending.  10/02: Tissue cultures and biopsies taken in OR - pending final reports but thus far tissue (+)rare GPC and rare gram variable rod, bone no organisms seen. Surgery team planning possible wound vac placement, continue monitor for now and await culture / pathology 10/03: BCx NGx3d, bone bx/cx and wound cx reincubated for better growth. Wound vac placed today, per surgery plan for monitor thru the weekend and plan discharge Mon 10/04: (+)acinetobacter and Ecoli, switching to meropenem abx  10/05: stable thru weekend 10/06: wound vac change today. Per general surgery d/w Dr Jordis pt ok from their standpoint for discharge to follow outpatient. D/w ID pharmacy, no good po option for abx given culture results, will ask ID for formal consult      Consultants:   General surgery  Procedures/Surgeries: 08/09/24 Excisional debridement of sacral decubitus ulcer and buttocks ulcer w/ Dr Marinda      ASSESSMENT & PLAN:   Sacral wound with infection, not meeting sepsis criteria S/p debridement sacral/buttock wound  Methadone  dependence due to chronic pain (+)acinetobacter and Ecoli Blood cultures x 2 NG x3d Vancomycin and zosyn  --> meropenem 08/12/24   D/w ID pharmacy, no good po option for abx given culture results, will ask ID for formal consult  Wound Care: Wound vac placed 08/11/24 and exchanged 08/14/24  Continue pressure offloading, frequent repositioning, low air loss mattress  continue home methadone  for chronic pain + added as needed oxycodone  and ibuprofen  for pain - caution given hypotension and opiates    Chronic hypotension:  midodrine  10 mg 3 times daily   Hypokalemia Replace as needed Monitor BMP  Chronic diastolic CHF (congestive heart failure) not in exacerbation:  2D echo 07/29/2020 showed EF 60 to 65% with grade 1 diastolic dysfunction.  Patient does not have SOB, only has trace leg edema, CHF seems to be compensated. Cautious restart home lasix  and doing ok  BNP - minimal elevation into 200s I&O, daily weights   Chronic kidney disease, stage 3a:   GFR> 60 today Follow-up with BMP   Chronic obstructive pulmonary disease : Stable bronchodilators and prn Mucinex   Iron  deficiency anemia: Hemoglobin stable 11.8 Continue iron  supplement   Hep C w/o coma, chronic S/p treatment.  AST 56, ALT 58, total bilirubin 0.6  Avoid using Tylenol  / other hepatotoxins    Depression, recurrent Current continue home medications, Abilify       No concerns based on  BMI: Body mass index is 19.55 kg/m.[name] Significantly low or high BMI is associated with higher medical risk.  Underweight - under 18  overweight - 25 to 29 obese - 30 or more Class 1 obesity: BMI of 30.0 to 34 Class 2 obesity: BMI of 35.0 to 39 Class 3 obesity: BMI  of 40.0 to 49 Super Morbid Obesity: BMI 50-59 Super-super Morbid Obesity: BMI 60+ Healthy nutrition and physical activity advised as adjunct to other disease management and risk reduction treatments    DVT prophylaxis: heparin IV fluids: none Nutrition: resuem diet postop heart healthy Central lines / other devices: none  Code Status: FULL CODE ACP documentation reviewed:  none on file in [facility]  [facility] needs: SNF Medical barriers to dispo: IV meropenem. Expected medical readiness for discharge pending final ID abx recs but medically stable otherwise

## 2024-08-09 NOTE — Consult Note (Signed)
 Pharmacy Antibiotic Note  Kim Graham is a 73 y.o. female admitted on 08/08/2024 with sacral wound infection.  Pharmacy has been consulted for vancomycin and Zosyn  dosing.  Plan: Adjust Vancomycin to 1000 mg IV every 24 hours Estimated AUC 566, Cmin 13.3 51.7 kg, Scr 0.8, Vd 0.72 Vancomycin levels at steady state or as clinically indicated Continue Zosyn  3.375g IV q8h (4 hour infusion). Follow renal function and cultures for adjustments  Height: 5' 4.02 (162.6 cm) Weight: 51.7 kg (113 lb 15.7 oz) IBW/kg (Calculated) : 54.74  Temp (24hrs), Avg:99.1 F (37.3 C), Min:98 F (36.7 C), Max:100.3 F (37.9 C)  Recent Labs  Lab 08/08/24 1618 08/09/24 0508  WBC 8.4 6.0  CREATININE 0.91 0.65  LATICACIDVEN 1.6  --     Estimated Creatinine Clearance: 51.9 mL/min (by C-G formula based on SCr of 0.65 mg/dL).    Allergies  Allergen Reactions   Doxycycline  Diarrhea    Very bad diarrhea    Antimicrobials this admission: vancomycin 9/30 >>  Zosyn  9/30 >>    Microbiology results: 9/30 BCx: IP  Thank you for allowing pharmacy to be a part of this patient's care.  Lum VEAR Mania, PharmD, BCPS 08/09/2024 7:33 AM

## 2024-08-09 NOTE — Transfer of Care (Signed)
 Immediate Anesthesia Transfer of Care Note  Patient: Kim Graham  Procedure(s) Performed: Procedure(s): Excisional debridement of sacral decubitus ulcer and buttocks ulcer (N/A)  Patient Location: PACU  Anesthesia Type:General  Level of Consciousness: sedated  Airway & Oxygen Therapy: Patient Spontanous Breathing and Patient connected to face mask oxygen  Post-op Assessment: Report given to RN and Post -op Vital signs reviewed and stable  Post vital signs: Reviewed and stable  Last Vitals:  Vitals:   08/09/24 1030 08/09/24 1247  BP: (!) 104/47 (!) 138/51  Pulse: 66 78  Resp: 17 (!) 22  Temp: 36.9 C   SpO2: 96% 100%    Complications: No apparent anesthesia complications

## 2024-08-09 NOTE — Anesthesia Preprocedure Evaluation (Addendum)
 Anesthesia Evaluation  Patient identified by MRN, date of birth, ID band Patient awake    Reviewed: Allergy & Precautions, NPO status , Patient's Chart, lab work & pertinent test results  Airway Mallampati: III  TM Distance: <3 FB Neck ROM: full    Dental  (+) Upper Dentures, Missing, Poor Dentition, Chipped, Dental Advisory Given   Pulmonary COPD,  COPD inhaler, Patient abstained from smoking., former smoker   Pulmonary exam normal breath sounds clear to auscultation       Cardiovascular Exercise Tolerance: Poor hypertension, Pt. on medications pulmonary hypertensionNormal cardiovascular exam Rhythm:Regular Rate:Normal     Neuro/Psych  PSYCHIATRIC DISORDERS  Depression    negative neurological ROS  negative psych ROS   GI/Hepatic negative GI ROS, Neg liver ROS,,,(+) Hepatitis -, C  Endo/Other  negative endocrine ROS    Renal/GU CRFRenal disease     Musculoskeletal negative musculoskeletal ROS (+)    Abdominal   Peds negative pediatric ROS (+)  Hematology negative hematology ROS (+) Blood dyscrasia, anemia   Anesthesia Other Findings Past Medical History: 12/09/2021: Acute cholecystitis No date: Bile leak No date: Chronic kidney disease, stage 3a (HCC) No date: Chronic venous insufficiency No date: COPD (chronic obstructive pulmonary disease) (HCC) No date: Depression No date: Hep C w/o coma, chronic (HCC)     Comment:  treated No date: History of kidney stones No date: Hypertension No date: Methadone  dependence (HCC) No date: Nephrolithiasis No date: Pre-diabetes No date: Sepsis secondary to UTI (HCC) No date: Thrombocytopenia  Past Surgical History: 12/09/2021: CYSTOSCOPY W/ URETERAL STENT PLACEMENT; Right     Comment:  Procedure: CYSTOSCOPY WITH RETROGRADE PYELOGRAM/URETERAL              STENT PLACEMENT;  Surgeon: Twylla Glendia BROCKS, MD;                Location: [facility];  Service: Urology;   Laterality:               Right; 02/11/2022: CYSTOSCOPY/URETEROSCOPY/HOLMIUM LASER/STENT PLACEMENT;  Bilateral     Comment:  Procedure: CYSTOSCOPY/URETEROSCOPY/HOLMIUM LASER/STENT               PLACEMENT & RIGHT URETERAL STENT EXCHANGE;  Surgeon:               Twylla Glendia BROCKS, MD;  Location: [facility];  Service:               Urology;  Laterality: Bilateral; 05/19/2022: CYSTOSCOPY/URETEROSCOPY/HOLMIUM LASER/STENT PLACEMENT;  Bilateral     Comment:  Procedure: CYSTOSCOPY/URETEROSCOPY/HOLMIUM LASER/STENT               PLACEMENT;  Surgeon: Twylla Glendia BROCKS, MD;  Location:               [facility];  Service: Urology;  Laterality: Bilateral; 12/11/2021: ENDOSCOPIC RETROGRADE CHOLANGIOPANCREATOGRAPHY (ERCP)  WITH PROPOFOL ; N/A     Comment:  Procedure: ENDOSCOPIC RETROGRADE               CHOLANGIOPANCREATOGRAPHY (ERCP) WITH PROPOFOL ;  Surgeon:               Jinny Carmine, MD;  Location: [facility];  Service:               Endoscopy;  Laterality: N/A; 03/10/2022: ERCP; N/A     Comment:  Procedure: ENDOSCOPIC RETROGRADE               CHOLANGIOPANCREATOGRAPHY (ERCP);  Surgeon: Jinny Carmine,  MD;  Location: [facility];  Service: Endoscopy;                Laterality: N/A;  Stent removal 06/23/2022: ERCP; N/A     Comment:  Procedure: ENDOSCOPIC RETROGRADE               CHOLANGIOPANCREATOGRAPHY (ERCP);  Surgeon: Jinny Carmine,               MD;  Location: [facility] ENDOSCOPY;  Service: Endoscopy;                Laterality: N/A;  stent removal 12/29/2023: PILONIDAL CYST EXCISION; N/A     Comment:  Procedure: CYST EXCISION PILONIDAL EXTENSIVE;  Surgeon:               Marinda Jayson KIDD, MD;  Location: [facility];  Service:               General;  Laterality: N/A; No date: PLACEMENT OF BREAST IMPLANTS; Bilateral No date: TONSILLECTOMY  BMI    Body Mass Index: 19.55 kg/m      Reproductive/Obstetrics negative OB ROS                              Anesthesia  Physical Anesthesia Plan  ASA: 3  Anesthesia Plan: General   Post-op Pain Management:    Induction: Intravenous  PONV Risk Score and Plan: 1 and Ondansetron  and Dexamethasone   Airway Management Planned: Oral ETT  Additional Equipment:   Intra-op Plan:   Post-operative Plan: Extubation in OR  Informed Consent: I have reviewed the patients History and Physical, chart, labs and discussed the procedure including the risks, benefits and alternatives for the proposed anesthesia with the patient or authorized representative who has indicated his/her understanding and acceptance.     Dental Advisory Given  Plan Discussed with: CRNA  Anesthesia Plan Comments:          Anesthesia Quick Evaluation

## 2024-08-09 NOTE — Plan of Care (Signed)

## 2024-08-10 ENCOUNTER — Encounter: Payer: Self-pay | Admitting: General Surgery

## 2024-08-10 ENCOUNTER — Inpatient Hospital Stay

## 2024-08-10 DIAGNOSIS — S31000D Unspecified open wound of lower back and pelvis without penetration into retroperitoneum, subsequent encounter: Secondary | ICD-10-CM | POA: Diagnosis not present

## 2024-08-10 LAB — CBC
HCT: 34.1 % — ABNORMAL LOW (ref 36.0–46.0)
Hemoglobin: 10.5 g/dL — ABNORMAL LOW (ref 12.0–15.0)
MCH: 25.7 pg — ABNORMAL LOW (ref 26.0–34.0)
MCHC: 30.8 g/dL (ref 30.0–36.0)
MCV: 83.6 fL (ref 80.0–100.0)
Platelets: 225 K/uL (ref 150–400)
RBC: 4.08 MIL/uL (ref 3.87–5.11)
RDW: 15.2 % (ref 11.5–15.5)
WBC: 5.8 K/uL (ref 4.0–10.5)
nRBC: 0 % (ref 0.0–0.2)

## 2024-08-10 LAB — BASIC METABOLIC PANEL WITH GFR
Anion gap: 9 (ref 5–15)
BUN: 12 mg/dL (ref 8–23)
CO2: 28 mmol/L (ref 22–32)
Calcium: 8.5 mg/dL — ABNORMAL LOW (ref 8.9–10.3)
Chloride: 102 mmol/L (ref 98–111)
Creatinine, Ser: 0.85 mg/dL (ref 0.44–1.00)
GFR, Estimated: >60 mL/min (ref >60)
Glucose, Bld: 135 mg/dL — ABNORMAL HIGH (ref 70–99)
Potassium: 3.6 mmol/L (ref 3.5–5.1)
Sodium: 139 mmol/L (ref 135–145)

## 2024-08-10 LAB — GLUCOSE, CAPILLARY: Glucose-Capillary: 78 mg/dL (ref 70–99)

## 2024-08-10 NOTE — Progress Notes (Signed)
 [facility] SURGICAL PROGRESS NOTE  Hospital Day(s): 2.   Post op day(s): 1 Day Post-Op.   Interval History:  Patient seen and examined No acute events or new complaints overnight.  Patient reports she is sore of course in her buttock No fever She is without leukocytosis; WBC 5.8K Hgb to 10.5; stable Renal function normal; sCr - 0.85; UO - unmeasured Tissue Cx with GPC, GVR Bone Biopsy pending She is on Vancomycin and Zosyn   Vital signs in last 24 hours: [min-max] current  Temp:  [97.5 F (36.4 C)-98.5 F (36.9 C)] 97.9 F (36.6 C) (10/02 0743) Pulse Rate:  [54-98] 58 (10/02 0743) Resp:  [16-22] 16 (10/02 0743) BP: (100-138)/(47-61) 100/47 (10/02 0743) SpO2:  [95 %-100 %] 97 % (10/02 0743) Weight:  [51.7 kg] 51.7 kg (10/02 0500)     Height: 5' 4.02 (162.6 cm) Weight: 51.7 kg BMI (Calculated): 19.55   Intake/Output last 2 shifts:  10/01 0701 - 10/02 0700 In: 1212.2 [P.O.:100; I.V.:700; IV Piggyback:412.2] Out: 15 [Blood:15]   Physical Exam:  Constitutional: alert, cooperative and no distress  Respiratory: breathing non-labored at rest  Cardiovascular: regular rate and sinus rhythm  Integumentary: Right buttock wound which tunnels to previous pilonidal cyst excision wound, there is scant nonviable tissue superficially, no evidence of further necrosis, no undrained abscess. There is good punctate ooze with packing removal.   Buttock / Sacral Wounds (08/10/2024):       Labs:     Latest Ref Rng & Units 08/10/2024    3:33 AM 08/09/2024    5:08 AM 08/08/2024    4:18 PM  CBC  WBC 4.0 - 10.5 K/uL 5.8  6.0  8.4   Hemoglobin 12.0 - 15.0 g/dL 89.4  89.6  88.1   Hematocrit 36.0 - 46.0 % 34.1  32.9  37.9   Platelets 150 - 400 K/uL 225  225  288       Latest Ref Rng & Units 08/10/2024    3:33 AM 08/09/2024    5:08 AM 08/08/2024    4:18 PM  CMP  Glucose 70 - 99 mg/dL 864  896  865   BUN 8 - 23 mg/dL 12  11  13    Creatinine 0.44 - 1.00 mg/dL 9.14  9.34   9.08   Sodium 135 - 145 mmol/L 139  141  140   Potassium 3.5 - 5.1 mmol/L 3.6  3.2  2.7   Chloride 98 - 111 mmol/L 102  103  96   CO2 22 - 32 mmol/L 28  29  29    Calcium 8.9 - 10.3 mg/dL 8.5  8.4  8.9   Total Protein 6.5 - 8.1 g/dL   6.9   Total Bilirubin 0.0 - 1.2 mg/dL   0.6   Alkaline Phos 38 - 126 U/L   99   AST 15 - 41 U/L   56   ALT 0 - 44 U/L   58      Imaging studies: No new pertinent imaging studies   Assessment/Plan: 73 y.o. female 1 Day Post-Op s/p excisional debridement of sacral decubitus ulcer and right buttock wound measuring 11 cm x 11 cm x 6 cm with debridement through skin, subcutaneous tissue, muscle, fascia and bone     - Wound Care: Pack wound(s) with saline (or Vase) moistened Kerlix gauze, there is a bridge between these two wounds, cover, and secure. Okay to change as needed. May need exterior dressing changed as needed with bowel movement/urination  -  We may be able to consider transition to wound vac as well at some point in the next couple days - Continue pressure offloading, frequent repositioning, low air loss mattress - Continue IV Abx; follow up Cx and bone biopsy - Pain control prn; antiemetics prn  - Further management per primary service    All of the above findings and recommendations were discussed with the patient, and the medical team, and all of patient's questions were answered to her expressed satisfaction.  -- Arthea Platt, PA-C [facility] 08/10/2024, 9:36 AM M-F: 7am - 4pm

## 2024-08-10 NOTE — Evaluation (Signed)
 Occupational Therapy Evaluation Patient Details Name: Kim Graham MRN: [id number] DOB: 1951/08/14 Today's Date: 08/10/2024   History of Present Illness   Pt is a 73 y/o F admitted on 08/08/24 after presenting with worsening pain in chronic sacral wound. Pt is s/p debridement on 08/09/24. PMH: chronic pain on methadone , COPD, diet-controlled DM2, HCV, dCHF, depression, CKD 3A, bile leakage, chronic venous insufficiency, kidney stone, chronic hypotension on midodrine      Clinical Impressions Chart reviewed to date, pt greeted semi supine in bed, oriented x4 agreeable to OT evaluation. PTA pt reports she is at rehab, working on walking. Prior to injury earlier in2025, she was MOD I-I in ADL/IADL. Pt presents with deficits in strength, endurance, activity tolerance, balance, affecting safe and optimal ADL completion. Sarmiento brace noted to be on LUE with wrist drop noted. Pt has an off the shelf wrist support with stockinet under (she had stockinet donned prior to session). Per care everywhere on 8/1 ortho appt, pt was to follow up with trauma surgeon, has not followed up yet. Pt educated regarding frequent repositioning to continue to decrease risk of continued breakdown. Pt is left semi supine in bed, all needs met. OT will follow.      If plan is discharge home, recommend the following:   A lot of help with walking and/or transfers;A lot of help with bathing/dressing/bathroom     Functional Status Assessment   Patient has had a recent decline in their functional status and demonstrates the ability to make significant improvements in function in a reasonable and predictable amount of time.     Equipment Recommendations   Other (comment) (defer to next venue of care)     Recommendations for Other Services         Precautions/Restrictions   Precautions Precautions: Fall Recall of Precautions/Restrictions: Intact Restrictions Weight Bearing Restrictions Per Provider Order:  Yes Other Position/Activity Restrictions: per secure chat with Dr Lorelle, pt to begin gentle WBing; she reports she has been using a RW in rehab     Mobility Bed Mobility Overal bed mobility: Needs Assistance Bed Mobility: Sidelying to Sit, Sit to Sidelying   Sidelying to sit: Mod assist, Max assist, +2 for physical assistance, HOB elevated, Used rails     Sit to sidelying: Mod assist      Transfers Overall transfer level: Needs assistance Equipment used: 1 person hand held assist   Sit to Stand: Min assist, +2 physical assistance     Step pivot transfers: Min assist, +2 physical assistance, +2 safety/equipment            Balance Overall balance assessment: Needs assistance Sitting-balance support: Feet supported Sitting balance-Leahy Scale: Fair     Standing balance support: Bilateral upper extremity supported, Reliant on assistive device for balance, During functional activity Standing balance-Leahy Scale: Poor                             ADL either performed or assessed with clinical judgement   ADL Overall ADL's : Needs assistance/impaired                     Lower Body Dressing: Maximal assistance   Toilet Transfer: Minimal assistance Toilet Transfer Details (indicate cue type and reason): SPT to bsc and back to bed +1-2 Toileting- Clothing Manipulation and Hygiene: Minimal assistance;Sit to/from stand Toileting - Clothing Manipulation Details (indicate cue type and reason): continent urinate on toilet  Vision Patient Visual Report: No change from baseline       Perception         Praxis         Pertinent Vitals/Pain Pain Assessment Pain Assessment: Faces Faces Pain Scale: Hurts little more Pain Location: sacral wound Pain Descriptors / Indicators: Grimacing, Guarding, Discomfort Pain Intervention(s): Monitored during session, Limited activity within patient's tolerance, Repositioned     Extremity/Trunk  Assessment Upper Extremity Assessment Upper Extremity Assessment: Right hand dominant;LUE deficits/detail (RUE generalized weakness) LUE Deficits / Details: pt has wrist drop- documented 8/1 ortho visit note as well. LUE in a Sarmiento brace as well as an off the shelf wrist support for the L wrist (that came to hospital with patient).   Lower Extremity Assessment Lower Extremity Assessment: Generalized weakness   Cervical / Trunk Assessment Cervical / Trunk Assessment:  (sacral wound)   Communication Communication Communication: No apparent difficulties   Cognition Arousal: Alert Behavior During Therapy: Anxious Cognition: Cognition impaired           Executive functioning impairment (select all impairments): Problem solving                   Following commands: Impaired Following commands impaired: Follows multi-step commands with increased time     Cueing  General Comments   Cueing Techniques: Verbal cues  vss, no dizziness reported   Exercises Other Exercises Other Exercises: edu re role of OT, role of rehab, discharge recommendations, need to frequently reposition   Shoulder Instructions      Home Living Family/patient expects to be discharged to:: Skilled nursing facility Living Arrangements: Alone Available Help at Discharge: Family;Available PRN/intermittently Type of Home: Mobile home Home Access: Stairs to enter Entrance Stairs-Number of Steps: 7 Entrance Stairs-Rails: Right;Left Home Layout: One level     Bathroom Shower/Tub: Chief Strategy Officer: Standard     Home Equipment: Cane - single Librarian, academic (2 wheels)   Additional Comments: has 2 dogs, 1 cat, boyfriend can assist PRN      Prior Functioning/Environment Prior Level of Function : History of Falls (last six months);Driving;Independent/Modified Independent             Mobility Comments: pt reports she was MOD I with SPC prior to fall in July; recently  at rehab, trying to amb ADLs Comments: prior to initial fall, pt was MOD I-I in ADL/IADL per her report; now requires asssit for ADL/IADL at rehab    OT Problem List: Decreased strength;Decreased safety awareness;Decreased activity tolerance;Decreased knowledge of use of DME or AE;Impaired balance (sitting and/or standing)   OT Treatment/Interventions: Zollars-care/ADL training;Therapeutic activities;Therapeutic exercise;Energy conservation;Patient/family education;Balance training;Splinting      OT Goals(Current goals can be found in the care plan section)   Acute Rehab OT Goals Patient Stated Goal: rehab OT Goal Formulation: With patient Time For Goal Achievement: 08/24/24 Potential to Achieve Goals: Good ADL Goals Pt Will Perform Grooming: with modified independence;sitting Pt Will Perform Lower Body Dressing: with min assist;sitting/lateral leans Pt Will Transfer to Toilet: with contact guard assist Pt Will Perform Toileting - Clothing Manipulation and hygiene: with contact guard assist;sitting/lateral leans;sit to/from stand   OT Frequency:  Min 2X/week    Co-evaluation PT/OT/SLP Co-Evaluation/Treatment: Yes Reason for Co-Treatment: For patient/therapist safety;To address functional/ADL transfers PT goals addressed during session: Mobility/safety with mobility;Balance OT goals addressed during session: ADL's and Mellen-care      AM-PAC OT 6 Clicks Daily Activity     Outcome Measure Help from another  person eating meals?: None Help from another person taking care of personal grooming?: None Help from another person toileting, which includes using toliet, bedpan, or urinal?: A Little Help from another person bathing (including washing, rinsing, drying)?: A Lot Help from another person to put on and taking off regular upper body clothing?: A Little Help from another person to put on and taking off regular lower body clothing?: A Lot 6 Click Score: 18   End of Session     Activity Tolerance: Patient tolerated treatment well Patient left: in bed;with call bell/phone within reach  OT Visit Diagnosis: Other abnormalities of gait and mobility (R26.89)                Time: 8982-8965 OT Time Calculation (min): 17 min Charges:  OT General Charges $OT Visit: 1 Visit OT Evaluation $OT Eval Moderate Complexity: 1 Mod  Therisa Sheffield, OTD OTR/L  08/10/24, 1:16 PM

## 2024-08-10 NOTE — Progress Notes (Signed)
 Kim Graham                                          MRN: [id number]   08/10/2024   The [facility] Quality Team Specialist reviewed this patient medical record for the purposes of chart review for care gap closure. The following were reviewed: chart review for care gap closure-breast cancer screening and diabetic eye exam.    [facility]I Quality Team

## 2024-08-10 NOTE — Care Management Important Message (Signed)
 Important Message  Patient Details  Name: Kim Graham MRN: [id number] Date of Birth: [birth year]   Important Message Given:   IMM notice reviewed with Ginger Sharps [phone] (friend)     [name] 08/10/2024, 3:07 PM

## 2024-08-10 NOTE — TOC Progression Note (Signed)
 Transition of Care [facility]) - Progression Note    Patient Details  Name: Kim Graham MRN: [id number] Date of Birth: [birth year]  Transition of Care [facility]) CM/SW Contact  [name], RN Phone Number: 08/10/2024, 2:39 PM  Clinical Narrative:     Patient confirms plan to return to [facility] at discharge  Per MD final cultures pending, and patient may need a wound vac  Patient is LTC at [facility], however therapy is recommending STR MD aware that insurance auth will need to be started                     Expected Discharge Plan and Services                                               Social Drivers of Health (SDOH) Interventions SDOH Screenings   Food Insecurity: No Food Insecurity (08/09/2024)  Housing: Low Risk  (08/09/2024)  Transportation Needs: No Transportation Needs (08/09/2024)  Utilities: Not At Risk (08/09/2024)  Alcohol Screen: Low Risk  (08/05/2021)  Depression (PHQ2-9): Medium Risk (05/10/2024)  Financial Resource Strain: Low Risk  (05/02/2024)   Received from [facility]  Physical Activity: Insufficiently Active (08/05/2021)  Social Connections: Unknown (08/09/2024)  Stress: No Stress Concern Present (08/05/2021)  Tobacco Use: Medium Risk (08/09/2024)    Readmission Risk Interventions    05/20/2024    3:52 PM  Readmission Risk Prevention Plan  Transportation Screening Complete  PCP or Specialist Appt within 3-5 Days Complete  HRI or Home Care Consult Complete  Social Work Consult for Recovery Care Planning/Counseling Complete  Palliative Care Screening Not Applicable  Medication Review Oceanographer) Complete

## 2024-08-10 NOTE — Consult Note (Signed)
 Pharmacy Antibiotic Note  Kim Graham is a 73 y.o. female admitted on 08/08/2024 with sacral wound infection.  Pharmacy has been consulted for vancomycin and Zosyn  dosing.  Plan: Adjust Vancomycin to 750 mg IV every 24 hours Estimated AUC 448.5, Cmin 10.9 51.7 kg, Scr 0.85, Vd 0.72 Vancomycin levels at steady state or as clinically indicated Continue Zosyn  3.375g IV q8h (4 hour infusion). Follow renal function and cultures for adjustments  Height: 5' 4.02 (162.6 cm) Weight: 51.7 kg (113 lb 15.7 oz) IBW/kg (Calculated) : 54.74  Temp (24hrs), Avg:98.1 F (36.7 C), Min:97.5 F (36.4 C), Max:98.5 F (36.9 C)  Recent Labs  Lab 08/08/24 1618 08/09/24 0508 08/10/24 0333  WBC 8.4 6.0 5.8  CREATININE 0.91 0.65 0.85  LATICACIDVEN 1.6  --   --     Estimated Creatinine Clearance: 48.8 mL/min (by C-G formula based on SCr of 0.85 mg/dL).    Allergies  Allergen Reactions   Doxycycline  Diarrhea    Very bad diarrhea    Antimicrobials this admission: vancomycin 9/30 >>  Zosyn  9/30 >>    Microbiology results: 9/30 BCx: NG x 2 days 10/1 tissue from wound: few gram negative rods 10/1 bone biopsy: no organisms seen, re-incubated for better growth  Thank you for allowing pharmacy to be a part of this patient's care.  Lum VEAR Mania, PharmD, BCPS 08/10/2024 9:08 AM

## 2024-08-10 NOTE — [place] FL2 (Signed)
   MEDICAID FL2 LEVEL OF CARE FORM     IDENTIFICATION  Patient Name: Kim Graham Birthdate: [birth year] Sex: female Admission Date (Current Location): 08/08/2024  [facility] and [place] Number:  Chiropodist and Address:         Provider Number: [id number]  Attending Physician Name and Address:  Marsa Edelman, DO  Relative Name and Phone Number:       Current Level of Care: Hospital Recommended Level of Care: Skilled Nursing Facility Prior Approval Number:    Date Approved/Denied:   PASRR Number:   [id number]  Discharge Plan: SNF    Current Diagnoses: Patient Active Problem List   Diagnosis Date Noted   Wound of right buttock 08/09/2024   Sacral wound with infection 08/08/2024   Chronic diastolic CHF (congestive heart failure) (HCC) 08/08/2024   Iron  deficiency anemia 08/08/2024   Chronic hypotension 08/08/2024   Rhabdomyolysis, traumatic 05/17/2024   Hypokalemia 05/17/2024   Cellulitis of right lower extremity 05/17/2024   Fall at home, initial encounter 05/17/2024   Hypomagnesemia 05/17/2024   Elevated troponin 05/17/2024   History of fracture of humerus 04/20/24 05/17/2024   Closed displaced spiral fracture of shaft of left humerus 05/17/2024   Chronic venous insufficiency 06/13/2023   Thrombocytopenia 05/21/2022   Chronic kidney disease, stage 3a (HCC) 05/20/2022   Nicotine dependence 05/19/2022   Ureteral calculus, left    Hydronephrosis of left kidney    Transaminitis 02/07/2022   IFG (impaired fasting glucose) 10/14/2021   Methadone  dependence (HCC) 04/04/2021   Hep C w/o coma, chronic (HCC) 07/11/2020   Diabetes mellitus type 2, diet-controlled (HCC) 04/15/2018   Depression, recurrent 03/18/2018   Advance directive discussed with patient 06/18/2017   Chronic obstructive pulmonary disease (HCC) 06/16/2017    Orientation RESPIRATION BLADDER Height & Weight     [place], Time, Situation, Place  Normal Continent Weight: 51.7  kg Height:  5' 4.02 (162.6 cm)  BEHAVIORAL SYMPTOMS/MOOD NEUROLOGICAL BOWEL NUTRITION STATUS      Continent Diet (heart healthy)  AMBULATORY STATUS COMMUNICATION OF NEEDS Skin   Extensive Assist Verbally Surgical wounds, PU Stage and Appropriate Care, Other (Comment) (unstagable which has been debrided, and deep tissue injury to heels)                       Personal Care Assistance Level of Assistance              Functional Limitations Info             SPECIAL CARE FACTORS FREQUENCY  PT (By licensed PT), OT (By licensed OT)                    Contractures Contractures Info: Not present    Additional Factors Info  Code Status, Allergies Code Status Info: full Allergies Info: doxycycline            Current Medications (08/10/2024):  This is the current hospital active medication list Current Facility-Administered Medications  Medication Dose Route Frequency Provider Last Rate Last Admin   albuterol  (PROVENTIL ) (2.5 MG/3ML) 0.083% nebulizer solution 2.5 mg  2.5 mg Inhalation Q4H PRN Marinda Jayson KIDD, MD       ARIPiprazole  (ABILIFY ) tablet 30 mg  30 mg Oral Daily Marinda Jayson KIDD, MD   30 mg at 08/10/24 0900   ascorbic acid  (VITAMIN C ) tablet 500 mg  500 mg Oral Daily Marinda Jayson KIDD, MD   500 mg at 08/10/24 0900   cyanocobalamin  (  VITAMIN B12) tablet 500 mcg  500 mcg Oral Daily Marinda Jayson KIDD, MD   500 mcg at 08/10/24 0900   dextromethorphan-guaiFENesin (MUCINEX DM) 30-600 MG per 12 hr tablet 1 tablet  1 tablet Oral BID PRN Marinda Jayson KIDD, MD       heparin injection 5,000 Units  5,000 Units Subcutaneous Q8H Marinda Jayson KIDD, MD   5,000 Units at 08/10/24 1336   ibuprofen  (ADVIL ) tablet 400 mg  400 mg Oral Q6H PRN Marinda Jayson KIDD, MD       iron  polysaccharides (NIFEREX) capsule 150 mg  150 mg Oral Daily Marinda Jayson KIDD, MD   150 mg at 08/10/24 0900   methadone  (DOLOPHINE ) 10 MG/ML solution 50 mg  50 mg Oral Q12H Marinda Jayson KIDD, MD   50 mg at 08/10/24 9082    midodrine  (PROAMATINE ) tablet 10 mg  10 mg Oral TID WC Marinda Jayson KIDD, MD   10 mg at 08/10/24 1225   multivitamin with minerals tablet 1 tablet  1 tablet Oral Daily Marinda Jayson KIDD, MD   1 tablet at 08/10/24 0900   ondansetron  (ZOFRAN ) injection 4 mg  4 mg Intravenous Q8H PRN Marinda Jayson KIDD, MD       oxyCODONE  (Oxy IR/ROXICODONE ) immediate release tablet 5 mg  5 mg Oral Q4H PRN Marinda Jayson KIDD, MD   5 mg at 08/10/24 1336   piperacillin -tazobactam (ZOSYN ) IVPB 3.375 g  3.375 g Intravenous Q8H Marinda Jayson KIDD, MD 12.5 mL/hr at 08/10/24 1048 3.375 g at 08/10/24 1048   potassium chloride  SA (KLOR-CON  M) CR tablet 40 mEq  40 mEq Oral Once Marinda Jayson KIDD, MD       vancomycin (VANCOCIN) IVPB 1000 mg/200 mL premix  1,000 mg Intravenous Q24H Marinda Jayson KIDD, MD   Stopped at 08/10/24 [phone]     Discharge Medications: Please see discharge summary for a list of discharge medications.  Relevant Imaging Results:  Relevant Lab Results:   Additional Information [id number]  [name], RN

## 2024-08-10 NOTE — Evaluation (Signed)
 Physical Therapy Evaluation Patient Details Name: Kim Graham MRN: [id number] DOB: [birth year] Today's Date: 08/10/2024  History of Present Illness  Pt is a 73 y/o F admitted on 08/08/24 after presenting with worsening pain in chronic sacral wound. Pt is s/p debridement on 08/09/24. PMH: chronic pain on methadone , COPD, diet-controlled DM2, HCV, dCHF, depression, CKD 3A, bile leakage, chronic venous insufficiency, kidney stone, chronic hypotension on midodrine   Clinical Impression  Pt seen for PT tx with pt agreeable, co-tx with OT. Pt reports prior to fall in July she was living alone, mod I with SPC, but since fall pt has been in rehab, working on walking with a walker. Pt anxious re: mobility, tearful, with PT providing encouragement. Pt agreeable to sitting EOB, requires up to +2 to come to sitting, supervision static sitting. Pt reports need to use bathroom, +1-2 for stand pivot bed<>BSC. Pt educated on need to reposition in bed every 2 hours. Recommend ongoing PT services to progress mobility as able.        If plan is discharge home, recommend the following: A lot of help with walking and/or transfers;A lot of help with bathing/dressing/bathroom;Assist for transportation;Assistance with cooking/housework;A little help with bathing/dressing/bathroom;Help with stairs or ramp for entrance   Can travel by private vehicle   No    Equipment Recommendations Other (comment) (hospital with air mattress overlay, specialty w/c cushion)  Recommendations for Other Services       Functional Status Assessment Patient has had a recent decline in their functional status and demonstrates the ability to make significant improvements in function in a reasonable and predictable amount of time.     Precautions / Restrictions Precautions Precautions: Fall Required Braces or Orthoses:  (pt with LUE brace) Restrictions Weight Bearing Restrictions Per Provider Order: Yes LUE Weight Bearing Per Provider  Order:  (per secure chat with ortho Dela) pt able to begin gentle weight bearing; pt reports she was using a RW in rehab prior to this admission)      Mobility  Bed Mobility Overal bed mobility: Needs Assistance Bed Mobility: Sidelying to Sit, Sit to Sidelying   Sidelying to sit: Mod assist, Max assist, +2 for physical assistance, HOB elevated, Used rails     Sit to sidelying:  (defer to OT, OT assisted pt with movement)      Transfers Overall transfer level: Needs assistance Equipment used: 1 person hand held assist Transfers: Sit to/from Stand, Bed to chair/wheelchair/BSC Sit to Stand: Min assist, +2 physical assistance   Step pivot transfers: Min assist, +2 physical assistance, +2 safety/equipment       General transfer comment: Pt transferred sit>stand from EOB with min assist +2, stand pivot bed>BSC on L with HHA, posterior lean.    Ambulation/Gait                  Stairs            Wheelchair Mobility     Tilt Bed    Modified Rankin (Stroke Patients Only)       Balance Overall balance assessment: Needs assistance Sitting-balance support: Feet supported Sitting balance-Leahy Scale: Fair Sitting balance - Comments: close supervision static sitting Postural control: Posterior lean Standing balance support: Bilateral upper extremity supported, Reliant on assistive device for balance, During functional activity                                 Pertinent Vitals/Pain Pain Assessment  Pain Assessment: Faces Faces Pain Scale: Hurts little more Pain Location: sacral wound Pain Descriptors / Indicators: Grimacing, Guarding, Discomfort Pain Intervention(s): Monitored during session, Limited activity within patient's tolerance, Repositioned    Home Living Family/patient expects to be discharged to:: Skilled nursing facility Living Arrangements: Alone Available Help at Discharge: Family;Available PRN/intermittently Type of Home:  Mobile home Home Access: Stairs to enter Entrance Stairs-Rails: Doctor, general practice of Steps: 7   Home Layout: One level Home Equipment: Cane - single Librarian, academic (2 wheels) Additional Comments: has 2 dogs, 1 cat, boyfriend can assist PRN    Prior Function               Mobility Comments: Pt reports she was mod I with SPC prior to fall in July, still driving. Pt most recently in rehab, was working on walking. Reports she had sacral wound prior to rehab but it has worsened.       Extremity/Trunk Assessment   Upper Extremity Assessment Upper Extremity Assessment: Defer to OT evaluation;LUE deficits/detail LUE Deficits / Details: LUE in shoulder brace, has stocking on L wrist, per chart, possible wrist nerve palsy    Lower Extremity Assessment Lower Extremity Assessment: Generalized weakness    Cervical / Trunk Assessment Cervical / Trunk Assessment:  (sacral wound)  Communication   Communication Communication: No apparent difficulties    Cognition Arousal: Alert Behavior During Therapy: Anxious   PT - Cognitive impairments: No family/caregiver present to determine baseline                         Following commands: Impaired Following commands impaired: Follows multi-step commands with increased time     Cueing Cueing Techniques: Verbal cues     General Comments General comments (skin integrity, edema, etc.): continent void on [facility], educated on need to reposition in bed every 2 hours    Exercises     Assessment/Plan    PT Assessment Patient needs continued PT services  PT Problem List Decreased strength;Pain;Decreased coordination;Decreased range of motion;Impaired sensation;Decreased activity tolerance;Decreased knowledge of use of DME;Decreased balance;Decreased safety awareness;Decreased mobility;Decreased knowledge of precautions;Decreased skin integrity       PT Treatment Interventions DME instruction;Balance  training;Gait training;Neuromuscular re-education;Functional mobility training;Stair training;Therapeutic activities;Therapeutic exercise;Manual techniques;Patient/family education    PT Goals (Current goals can be found in the Care Plan section)  Acute Rehab PT Goals Patient Stated Goal: get better PT Goal Formulation: With patient Time For Goal Achievement: 08/24/24 Potential to Achieve Goals: Fair Additional Goals Additional Goal #1: Pt will be able to demonstrate ability to turn/rotate in bed every 2 hours for pressure relief without assistance.    Frequency Min 2X/week     Co-evaluation PT/OT/SLP Co-Evaluation/Treatment: Yes Reason for Co-Treatment: For patient/therapist safety;To address functional/ADL transfers PT goals addressed during session: Mobility/safety with mobility;Balance         AM-PAC PT 6 Clicks Mobility  Outcome Measure Help needed turning from your back to your side while in a flat bed without using bedrails?: A Lot Help needed moving from lying on your back to sitting on the side of a flat bed without using bedrails?: Total Help needed moving to and from a bed to a chair (including a wheelchair)?: A Lot Help needed standing up from a chair using your arms (e.g., wheelchair or bedside chair)?: A Lot Help needed to walk in hospital room?: Total Help needed climbing 3-5 steps with a railing? : Total 6 Click Score: 9  End of Session Equipment Utilized During Treatment:  (L shoulder brace) Activity Tolerance: Patient tolerated treatment well;Patient limited by fatigue Patient left: in bed;with call bell/phone within reach Nurse Communication: Mobility status PT Visit Diagnosis: Muscle weakness (generalized) (M62.81);Other abnormalities of gait and mobility (R26.89);Unsteadiness on feet (R26.81);Difficulty in walking, not elsewhere classified (R26.2)    Time: 8991-8965 PT Time Calculation (min) (ACUTE ONLY): 26 min   Charges:   PT Evaluation $PT  Eval Moderate Complexity: 1 Mod   PT General Charges $$ ACUTE PT VISIT: 1 Visit         Richerd Pinal, PT, DPT 08/10/24, 10:49 AM   [name] 08/10/2024, 10:46 AM

## 2024-08-10 NOTE — Plan of Care (Signed)

## 2024-08-10 NOTE — Progress Notes (Signed)
 PROGRESS NOTE    Kim Graham   FMW:[id number] DOB: 09/29/1951  DOA: 08/08/2024 Date of Service: 08/10/24 which is hospital day 2  PCP: Vicci Duwaine SQUIBB, DO    Hospital course / significant events:   HPI: Kim Graham is a 73 y.o. female with medical history significant of chronic pain on methadone , COPD, diet-controled type 2 diabetes, history of HCV status posttreatment, dCHF, depression, CKD-3A, bile leakage, chronic venous insufficiency, kidney stone, chronic hypotension on midodrine , who presents with sacral wound infection. Pt has hx of pilonidal cyst and underwent extensive resection in the past, with a chronic sacral wound in right buttock area. Pt is following up with Dr. Marinda of surgery. Pt states that she has worsening pain in the right buttock wound recently.  The pain is constant, aching, severe, nonradiating, aggravated by movement.  Pt was seen by Dr. Marinda today who recommended hospital admission and operative debridement.   09/30: admitted to hospitalist service w/ surgical consult, plan for OR tomorrow for debridement. IV abx  10/01: to OR today. Excisional debridement of sacral decubitus ulcer and right buttock wound measuring 11 cm x 11 cm x 6 cm with debridement through skin, subcutaneous tissue, muscle, fascia and bone. Cultures and biopsies R buttock tissue and sacral bone pending.  10/02: Tissue cultures and biopsies taken in OR - pending final reports but thus far tissue (+)rare GPC and rare gram variable rod, bone no organisms seen. Surgery team planning possible wound vac placement, continue monitor for now and await culture / pathology     Consultants:  General surgery  Procedures/Surgeries: 08/09/24 Excisional debridement of sacral decubitus ulcer and buttocks ulcer w/ Dr Marinda      ASSESSMENT & PLAN:   Sacral wound with infection, not meeting sepsis criteria POD0 s/p debridement sacral/buttock wound  Methadone  dependence due to chronic  pain Vancomycin and zosyn  Blood cultures x 2  Tissue cultures and biopsies taken in OR - pending final reports but thus far tissue (+)rare GPC and rare gram variable rod, bone no organisms seen  Wound Care: Pack wound(s) with saline (or Vase) moistened Kerlix gauze, there is a bridge between these two wounds, cover, and secure. Okay to change as needed. May need exterior dressing changed as needed with bowel movement/urination  Per surgery team, may be able to consider transition to wound vac as well at some point in the next couple days Continue pressure offloading, frequent repositioning, low air loss mattress  continue home methadone  for chronic pain + added as needed oxycodone  and ibuprofen  for pain - caution given hypotension and opiates    Chronic hypotension:  midodrine  10 mg 3 times daily   Hypokalemia Replace as needed Monitor BMP  Chronic diastolic CHF (congestive heart failure) not in exacerbation:  2D echo 07/29/2020 showed EF 60 to 65% with grade 1 diastolic dysfunction.  Patient does not have SOB, only has trace leg edema, CHF seems to be compensated. Hold Lasix  due to soft blood pressure Check BNP - minimal elevation into 200s I&O, daily weights   Chronic kidney disease, stage 3a:   GFR> 60 today Follow-up with BMP   Chronic obstructive pulmonary disease : Stable bronchodilators and prn Mucinex   Iron  deficiency anemia: Hemoglobin stable 11.8 Continue iron  supplement   Hep C w/o coma, chronic S/p treatment.  AST 56, ALT 58, total bilirubin 0.6  Avoid using Tylenol  / other hepatotoxins    Depression, recurrent Current continue home medications, Abilify       No  concerns based on BMI: Body mass index is 19.55 kg/m.[name] Significantly low or high BMI is associated with higher medical risk.  Underweight - under 18  overweight - 25 to 29 obese - 30 or more Class 1 obesity: BMI of 30.0 to 34 Class 2 obesity: BMI of 35.0 to 39 Class 3 obesity: BMI of 40.0 to  49 Super Morbid Obesity: BMI 50-59 Super-super Morbid Obesity: BMI 60+ Healthy nutrition and physical activity advised as adjunct to other disease management and risk reduction treatments    DVT prophylaxis: heparin IV fluids: none Nutrition: resuem diet postop heart healthy Central lines / other devices: none  Code Status: FULL CODE ACP documentation reviewed:  none on file in [facility]  TOC needs: TBD Medical barriers to dispo: await cultures to guide abx, possible wound vac placement per gen surg. Expected medical readiness for discharge next few days / pending surgical clearance             Subjective / Brief ROS:  Patient reports feeling okay today Pain is fairly well controlled Denies CP/SOB.  Pain controlled.  Denies new weakness. .    Family Communication: none at this time     Objective Findings:  Vitals:   08/10/24 0337 08/10/24 0500 08/10/24 0743 08/10/24 1419  BP: (!) 107/49  (!) 100/47 (!) 107/41  Pulse: (!) 54  (!) 58 66  Resp: 16  16 16   Temp: 97.7 F (36.5 C)  97.9 F (36.6 C) 97.9 F (36.6 C)  TempSrc: Oral     SpO2: 95%  97% 93%  Weight:  51.7 kg    Height:        Intake/Output Summary (Last 24 hours) at 08/10/2024 1444 Last data filed at 08/10/2024 1411 Gross per 24 hour  Intake 622.24 ml  Output --  Net 622.24 ml   Filed Weights   08/08/24 1557 08/09/24 0548 08/10/24 0500  Weight: 51.7 kg 51.7 kg 51.7 kg    Examination:  Physical Exam Constitutional:      General: She is not in acute distress. Cardiovascular:     Rate and Rhythm: Normal rate and regular rhythm.  Pulmonary:     Effort: Pulmonary effort is normal.     Breath sounds: Normal breath sounds.  Abdominal:     Palpations: Abdomen is soft.  Skin:    General: Skin is warm and dry.  Neurological:     Mental Status: She is alert.          Scheduled Medications:   ARIPiprazole   30 mg Oral Daily   ascorbic acid   500 mg Oral Daily   cyanocobalamin   500 mcg  Oral Daily   heparin  5,000 Units Subcutaneous Q8H   iron  polysaccharides  150 mg Oral Daily   methadone   50 mg Oral Q12H   midodrine   10 mg Oral TID WC   multivitamin with minerals  1 tablet Oral Daily   potassium chloride   40 mEq Oral Once    Continuous Infusions:  piperacillin -tazobactam (ZOSYN )  IV 3.375 g (08/10/24 1048)   vancomycin Stopped (08/10/24 0057)    PRN Medications:  albuterol , dextromethorphan-guaiFENesin, ibuprofen , ondansetron  (ZOFRAN ) IV, oxyCODONE   Antimicrobials from admission:  Anti-infectives (From admission, onward)    Start     Dose/Rate Route Frequency Ordered Stop   08/09/24 2000  vancomycin (VANCOREADY) IVPB 750 mg/150 mL  Status:  Discontinued        750 mg 150 mL/hr over 60 Minutes Intravenous Every 24 hours 08/08/24 2003 08/09/24  9263   08/09/24 2000  vancomycin (VANCOCIN) IVPB 1000 mg/200 mL premix        1,000 mg 200 mL/hr over 60 Minutes Intravenous Every 24 hours 08/09/24 0736     08/09/24 0330  piperacillin -tazobactam (ZOSYN ) IVPB 3.375 g        3.375 g 12.5 mL/hr over 240 Minutes Intravenous Every 8 hours 08/08/24 1955     08/08/24 2000  vancomycin (VANCOREADY) IVPB 1250 mg/250 mL        1,250 mg 166.7 mL/hr over 90 Minutes Intravenous  Once 08/08/24 1954 08/08/24 2235   08/08/24 1915  vancomycin (VANCOCIN) IVPB 1000 mg/200 mL premix  Status:  Discontinued        1,000 mg 200 mL/hr over 60 Minutes Intravenous  Once 08/08/24 1901 08/08/24 1954   08/08/24 1915  piperacillin -tazobactam (ZOSYN ) IVPB 3.375 g        3.375 g 100 mL/hr over 30 Minutes Intravenous  Once 08/08/24 1901 08/08/24 2017           Data Reviewed:  I have personally reviewed the following...  CBC: Recent Labs  Lab 08/08/24 1618 08/09/24 0508 08/10/24 0333  WBC 8.4 6.0 5.8  NEUTROABS 6.3  --   --   HGB 11.8* 10.3* 10.5*  HCT 37.9 32.9* 34.1*  MCV 83.7 83.5 83.6  PLT 288 225 225   Basic Metabolic Panel: Recent Labs  Lab 08/08/24 1618 08/09/24 0508  08/10/24 0333  NA 140 141 139  K 2.7* 3.2* 3.6  CL 96* 103 102  CO2 29 29 28   GLUCOSE 134* 103* 135*  BUN 13 11 12   CREATININE 0.91 0.65 0.85  CALCIUM 8.9 8.4* 8.5*  MG 2.1  --   --   PHOS 3.1  --   --    GFR: Estimated Creatinine Clearance: 48.8 mL/min (by C-G formula based on SCr of 0.85 mg/dL). Liver Function Tests: Recent Labs  Lab 08/08/24 1618  AST 56*  ALT 58*  ALKPHOS 99  BILITOT 0.6  PROT 6.9  ALBUMIN 3.0*   No results for input(s): LIPASE, AMYLASE in the last 168 hours. No results for input(s): AMMONIA in the last 168 hours. Coagulation Profile: Recent Labs  Lab 08/09/24 0508  INR 1.2   Cardiac Enzymes: No results for input(s): CKTOTAL, CKMB, CKMBINDEX, TROPONINI in the last 168 hours. BNP (last 3 results) No results for input(s): PROBNP in the last 8760 hours. HbA1C: No results for input(s): HGBA1C in the last 72 hours. CBG: Recent Labs  Lab 08/09/24 0813 08/10/24 0741  GLUCAP 87 78   Lipid Profile: No results for input(s): CHOL, HDL, LDLCALC, TRIG, CHOLHDL, LDLDIRECT in the last 72 hours. Thyroid  Function Tests: No results for input(s): TSH, T4TOTAL, FREET4, T3FREE, THYROIDAB in the last 72 hours. Anemia Panel: No results for input(s): VITAMINB12, FOLATE, FERRITIN, TIBC, IRON , RETICCTPCT in the last 72 hours. Most Recent Urinalysis On File:     Component Value Date/Time   COLORURINE AMBER (A) 05/17/2024 1934   APPEARANCEUR CLEAR (A) 05/17/2024 1934   APPEARANCEUR Cloudy (A) 04/16/2023 1424   LABSPEC 1.019 05/17/2024 1934   PHURINE 6.0 05/17/2024 1934   GLUCOSEU NEGATIVE 05/17/2024 1934   HGBUR LARGE (A) 05/17/2024 1934   BILIRUBINUR NEGATIVE 05/17/2024 1934   BILIRUBINUR Negative 04/16/2023 1424   KETONESUR 5 (A) 05/17/2024 1934   PROTEINUR 100 (A) 05/17/2024 1934   NITRITE NEGATIVE 05/17/2024 1934   LEUKOCYTESUR TRACE (A) 05/17/2024 1934   Sepsis  Labs: @LABRCNTIP (procalcitonin:4,lacticidven:4) Microbiology: Recent Results (from the  past 240 hours)  Culture, blood (routine x 2)     Status: None (Preliminary result)   Collection Time: 08/08/24  6:55 PM   Specimen: BLOOD  Result Value Ref Range Status   Specimen Description BLOOD RIGHT ANTECUBITAL  Final   Special Requests   Final    BOTTLES DRAWN AEROBIC AND ANAEROBIC Blood Culture results may not be optimal due to an inadequate volume of blood received in culture bottles   Culture   Final    NO GROWTH 2 DAYS Performed at [facility], [address], [place], [place] [address]    Report Status PENDING  Incomplete  Culture, blood (routine x 2)     Status: None (Preliminary result)   Collection Time: 08/08/24  7:20 PM   Specimen: BLOOD  Result Value Ref Range Status   Specimen Description BLOOD BLOOD RIGHT HAND  Final   Special Requests   Final    BOTTLES DRAWN AEROBIC AND ANAEROBIC Blood Culture results may not be optimal due to an inadequate volume of blood received in culture bottles   Culture   Final    NO GROWTH 2 DAYS Performed at [facility], [address], [place], [place] [address]    Report Status PENDING  Incomplete  Aerobic/Anaerobic Culture w Gram Stain (surgical/deep wound)     Status: None (Preliminary result)   Collection Time: 08/09/24 12:09 PM   Specimen: Wound; Tissue  Result Value Ref Range Status   Specimen Description   Final    TISSUE BUTTOCKS Performed at [facility], [address] [place], [place] [address]    Special Requests   Final    NONE Performed at [facility], [address] [place], [place] [address]    Gram Stain   Final    WBC PRESENT, PREDOMINANTLY PMN RARE GRAM POSITIVE COCCI RARE GRAM VARIABLE ROD    Culture   Final    FEW GRAM NEGATIVE RODS CULTURE REINCUBATED FOR BETTER GROWTH Performed at [facility], [address], [place], [place] [address]    Report Status PENDING   Incomplete  Aerobic/Anaerobic Culture w Gram Stain (surgical/deep wound)     Status: None (Preliminary result)   Collection Time: 08/09/24 12:09 PM   Specimen: PATH Bone biopsy; Tissue  Result Value Ref Range Status   Specimen Description   Final    TISSUE Performed at [facility], [address] [place], [place] [address]    Special Requests   Final    BONE BOIPSY Performed at [facility], [address] [place], [place] [address]    Gram Stain   Final    RARE WBC PRESENT, PREDOMINANTLY PMN NO ORGANISMS SEEN    Culture   Final    CULTURE REINCUBATED FOR BETTER GROWTH Performed at [facility], [address] [place], [place] [address]    Report Status PENDING  Incomplete      Radiology Studies last 3 days: No results found.        Khizar Fiorella, DO [facility] 08/10/2024, 2:44 PM    Dictation software may have been used to generate the above note. Typos may occur and escape review in typed/dictated notes. Please contact Dr Marsa directly for clarity if needed.  Staff may message me via secure chat in Epic  but this may not receive an immediate response,  please page me for urgent matters!  If 7PM-7AM, please contact night coverage [email or web address]

## 2024-08-10 NOTE — Care Management Important Message (Signed)
 Important Message  Patient Details  Name: Kim Graham MRN: [id number] Date of Birth: May 29, 1951   Important Message Given:  Yes - Medicare IM     [name] 08/10/2024, 3:07 PM

## 2024-08-11 DIAGNOSIS — S31000D Unspecified open wound of lower back and pelvis without penetration into retroperitoneum, subsequent encounter: Secondary | ICD-10-CM | POA: Diagnosis not present

## 2024-08-11 LAB — GLUCOSE, CAPILLARY
Glucose-Capillary: 95 mg/dL (ref 70–99)
Glucose-Capillary: 104 mg/dL — ABNORMAL HIGH (ref 70–99)
Glucose-Capillary: 138 mg/dL — ABNORMAL HIGH (ref 70–99)

## 2024-08-11 MED ORDER — PROSOURCE PLUS PO LIQD
30.0000 mL | Freq: Two times a day (BID) | ORAL | Status: DC
  Administered 2024-08-11 – 2024-08-16 (×11): 30 mL via ORAL

## 2024-08-11 MED ORDER — HYDROMORPHONE HCL 1 MG/ML IJ SOLN
0.5000 mg | Freq: Once | INTRAMUSCULAR | Status: AC
  Administered 2024-08-11: 0.5 mg via INTRAVENOUS
  Filled 2024-08-11: qty 0.5

## 2024-08-11 NOTE — Progress Notes (Signed)
 Patient s/p debridement of sacral decubitus ulcer and right buttock pressure ulcer. Dressing removed today. Wound base with bleeding tissue, on right buttock wound some fibrionous exudate along wound edges but otherwise bleeding tissue. I think it is okay to place wound vac today. This was applied with good suction, plan for change on Monday.

## 2024-08-11 NOTE — Progress Notes (Signed)
 Physical Therapy Treatment Patient Details Name: Kim Graham MRN: [id number] DOB: [birth year] Today's Date: 08/11/2024   History of Present Illness Pt is a 73 y/o F admitted on 08/08/24 after presenting with worsening pain in chronic sacral wound. Pt is s/p debridement on 08/09/24. PMH: chronic pain on methadone , COPD, diet-controlled DM2, HCV, dCHF, depression, CKD 3A, bile leakage, chronic venous insufficiency, kidney stone, chronic hypotension on midodrine     PT Comments  Patient seen for PT session focused on bed mobility and standing balance. Patient required modA  for bed mobility requiring verbal cues and physical assistance to sit EOB. Pt able to stand at bedside with 1 hand held assistance/modA.Tolerated session well with no signs of exertion or distress. Main limiting factors today were wound vac placement, limited UE strength and mostly pt. Apprehension for out of bed mobility. Interventions aimed at improving standing balance. Continued skilled PT recommended to progress toward functional goals and support discharge readiness.    If plan is discharge home, recommend the following: A lot of help with walking and/or transfers;A lot of help with bathing/dressing/bathroom;Assist for transportation;Assistance with cooking/housework;A little help with bathing/dressing/bathroom;Help with stairs or ramp for entrance   Can travel by private vehicle     No  Equipment Recommendations       Recommendations for Other Services       Precautions / Restrictions Precautions Precautions: Fall Recall of Precautions/Restrictions: Intact Precaution/Restrictions Comments: wound vac placement lumbosacral region Restrictions Weight Bearing Restrictions Per Provider Order: No LUE Weight Bearing Per Provider Order: Non weight bearing Other Position/Activity Restrictions: per secure chat with Dr Lorelle, pt to begin gentle WBing     Mobility  Bed Mobility Overal bed mobility: Needs Assistance Bed  Mobility: Sidelying to Sit, Sit to Sidelying   Sidelying to sit: Mod assist, Min assist     Sit to sidelying: Mod assist General bed mobility comments: +2 for positioning in bed due to limited use of LUE    Transfers Overall transfer level: Needs assistance Equipment used: 1 person hand held assist Transfers: Sit to/from Stand, Bed to chair/wheelchair/BSC Sit to Stand: Min assist           General transfer comment: Pt transferred sit>stand from EOB with min assist    Ambulation/Gait   Gait Distance (Feet): 3 Feet Assistive device: 1 person hand held assist Gait Pattern/deviations: Step-to pattern     Pre-gait activities: heel slide x 5 and bridges x 5 General Gait Details: able to march and take few steps forward and backward with vc to march   Stairs             Wheelchair Mobility     Tilt Bed    Modified Rankin (Stroke Patients Only)       Balance Overall balance assessment: Needs assistance Sitting-balance support: Feet supported Sitting balance-Leahy Scale: Fair Sitting balance - Comments: close supervision static sitting Postural control: Posterior lean Standing balance support: Bilateral upper extremity supported, Reliant on assistive device for balance, During functional activity Standing balance-Leahy Scale: Poor Standing balance comment: significant posterior lean with standing; vc for anterior weight shift                            Communication Communication Communication: No apparent difficulties  Cognition Arousal: Alert Behavior During Therapy: Anxious, WFL for tasks assessed/performed   PT - Cognitive impairments: No family/caregiver present to determine baseline  Following commands: Impaired Following commands impaired: Follows multi-step commands with increased time    Cueing Cueing Techniques: Verbal cues  Exercises General Exercises - Lower Extremity Long Arc Quad: AROM,  Right, Left, 10 reps    General Comments        Pertinent Vitals/Pain Pain Assessment Pain Assessment: Faces Faces Pain Scale: Hurts little more Pain Location: sacral wound Pain Descriptors / Indicators: Grimacing, Guarding, Discomfort Pain Intervention(s): Monitored during session, Repositioned, Limited activity within patient's tolerance    Home Living                          Prior Function            PT Goals (current goals can now be found in the care plan section) Acute Rehab PT Goals Patient Stated Goal: get better PT Goal Formulation: With patient Time For Goal Achievement: 08/24/24 Potential to Achieve Goals: Fair    Frequency    Min 2X/week      PT Plan      Co-evaluation              AM-PAC PT 6 Clicks Mobility   Outcome Measure  Help needed turning from your back to your side while in a flat bed without using bedrails?: A Little Help needed moving from lying on your back to sitting on the side of a flat bed without using bedrails?: A Lot Help needed moving to and from a bed to a chair (including a wheelchair)?: A Lot Help needed standing up from a chair using your arms (e.g., wheelchair or bedside chair)?: A Lot Help needed to walk in hospital room?: Total Help needed climbing 3-5 steps with a railing? : Total 6 Click Score: 11    End of Session   Activity Tolerance: Patient tolerated treatment well;Patient limited by fatigue Patient left: in bed;with call bell/phone within reach Nurse Communication: Mobility status PT Visit Diagnosis: Muscle weakness (generalized) (M62.81);Other abnormalities of gait and mobility (R26.89);Unsteadiness on feet (R26.81);Difficulty in walking, not elsewhere classified (R26.2)     Time: 8470-8452 PT Time Calculation (min) (ACUTE ONLY): 18 min  Charges:    $Therapeutic Activity: 8-22 mins PT General Charges $$ ACUTE PT VISIT: 1 Visit                     Kim Graham DPT, PT    Kim Graham 08/11/2024, 4:00 PM

## 2024-08-11 NOTE — Progress Notes (Signed)
 PROGRESS NOTE    Kim Graham   FMW:[id number] DOB: [birth year]  DOA: 08/08/2024 Date of Service: 08/11/24 which is hospital day 3  PCP: Vicci Duwaine SQUIBB, DO    Hospital course / significant events:   HPI: Kim Graham is a 73 y.o. female with medical history significant of chronic pain on methadone , COPD, diet-controled type 2 diabetes, history of HCV status posttreatment, dCHF, depression, CKD-3A, bile leakage, chronic venous insufficiency, kidney stone, chronic hypotension on midodrine , who presents with sacral wound infection. Pt has hx of pilonidal cyst and underwent extensive resection in the past, with a chronic sacral wound in right buttock area. Pt is following up with Dr. Marinda of surgery. Pt states that she has worsening pain in the right buttock wound recently.  The pain is constant, aching, severe, nonradiating, aggravated by movement.  Pt was seen by Dr. Marinda today who recommended hospital admission and operative debridement.   09/30: admitted to hospitalist service w/ surgical consult, plan for OR tomorrow for debridement. IV abx  10/01: to OR today. Excisional debridement of sacral decubitus ulcer and right buttock wound measuring 11 cm x 11 cm x 6 cm with debridement through skin, subcutaneous tissue, muscle, fascia and bone. Cultures and biopsies R buttock tissue and sacral bone pending.  10/02: Tissue cultures and biopsies taken in OR - pending final reports but thus far tissue (+)rare GPC and rare gram variable rod, bone no organisms seen. Surgery team planning possible wound vac placement, continue monitor for now and await culture / pathology 10/03: BCx NGx3d, bone bx/cx and wound cx reincubated for better growth. Wound vac placed today, per surgery plan for monitor thru the weekend and plan discharge Mon     Consultants:  General surgery  Procedures/Surgeries: 08/09/24 Excisional debridement of sacral decubitus ulcer and buttocks ulcer w/ Dr  Marinda      ASSESSMENT & PLAN:   Sacral wound with infection, not meeting sepsis criteria POD0 s/p debridement sacral/buttock wound  Methadone  dependence due to chronic pain Blood cultures x 2 NG x3d Vancomycin and zosyn  Tissue cultures and biopsies taken in OR - pending final reports but thus far: tissue (+)rare GPC and rare gram variable rod, bone no organisms seen --> both have been reincubated for better growth Wound Care: Wound vac placed 08/11/24  Continue pressure offloading, frequent repositioning, low air loss mattress  continue home methadone  for chronic pain + added as needed oxycodone  and ibuprofen  for pain - caution given hypotension and opiates    Chronic hypotension:  midodrine  10 mg 3 times daily   Hypokalemia Replace as needed Monitor BMP  Chronic diastolic CHF (congestive heart failure) not in exacerbation:  2D echo 07/29/2020 showed EF 60 to 65% with grade 1 diastolic dysfunction.  Patient does not have SOB, only has trace leg edema, CHF seems to be compensated. Hold Lasix  due to soft blood pressure Check BNP - minimal elevation into 200s I&O, daily weights   Chronic kidney disease, stage 3a:   GFR> 60 today Follow-up with BMP   Chronic obstructive pulmonary disease : Stable bronchodilators and prn Mucinex   Iron  deficiency anemia: Hemoglobin stable 11.8 Continue iron  supplement   Hep C w/o coma, chronic S/p treatment.  AST 56, ALT 58, total bilirubin 0.6  Avoid using Tylenol  / other hepatotoxins    Depression, recurrent Current continue home medications, Abilify       No concerns based on BMI: Body mass index is 19.55 kg/m.[name] Significantly low or high BMI is associated  with higher medical risk.  Underweight - under 18  overweight - 25 to 29 obese - 30 or more Class 1 obesity: BMI of 30.0 to 34 Class 2 obesity: BMI of 35.0 to 39 Class 3 obesity: BMI of 40.0 to 49 Super Morbid Obesity: BMI 50-59 Super-super Morbid Obesity: BMI 60+ Healthy  nutrition and physical activity advised as adjunct to other disease management and risk reduction treatments    DVT prophylaxis: heparin IV fluids: none Nutrition: resuem diet postop heart healthy Central lines / other devices: none  Code Status: FULL CODE ACP documentation reviewed:  none on file in [facility]  TOC needs: TBD Medical barriers to dispo: await cultures to guide abx, wound vac placement per gen surg. Expected medical readiness for discharge next few days / pending surgical clearance likely Monday              Subjective / Brief ROS:  Patient reports feeling more pain today  Denies CP/SOB.  Denies new weakness. .    Family Communication: none at this time     Objective Findings:  Vitals:   08/11/24 0341 08/11/24 0749 08/11/24 1139 08/11/24 1618  BP: (!) 106/49 (!) 110/54 (!) 135/54 (!) 105/40  Pulse: (!) 56 64 (!) 59 60  Resp: 17 18 18 18   Temp:  98.8 F (37.1 C) 98.3 F (36.8 C) 98.1 F (36.7 C)  TempSrc:  Oral Oral   SpO2: 91% 94% 94% 96%  Weight:      Height:        Intake/Output Summary (Last 24 hours) at 08/11/2024 1619 Last data filed at 08/11/2024 1557 Gross per 24 hour  Intake 337.45 ml  Output 500 ml  Net -162.55 ml   Filed Weights   08/08/24 1557 08/09/24 0548 08/10/24 0500  Weight: 51.7 kg 51.7 kg 51.7 kg    Examination:  Physical Exam Constitutional:      General: She is not in acute distress. Cardiovascular:     Rate and Rhythm: Normal rate and regular rhythm.  Pulmonary:     Effort: Pulmonary effort is normal.     Breath sounds: Normal breath sounds.  Abdominal:     Palpations: Abdomen is soft.  Skin:    General: Skin is warm and dry.  Neurological:     Mental Status: She is alert.          Scheduled Medications:   (feeding supplement) PROSource Plus  30 mL Oral BID BM   ARIPiprazole   30 mg Oral Daily   ascorbic acid   500 mg Oral Daily   cyanocobalamin   500 mcg Oral Daily   heparin  5,000 Units  Subcutaneous Q8H   iron  polysaccharides  150 mg Oral Daily   methadone   50 mg Oral Q12H   midodrine   10 mg Oral TID WC   multivitamin with minerals  1 tablet Oral Daily   potassium chloride   40 mEq Oral Once    Continuous Infusions:  piperacillin -tazobactam (ZOSYN )  IV 12.5 mL/hr at 08/11/24 1557   vancomycin Stopped (08/10/24 2103)    PRN Medications:  albuterol , dextromethorphan-guaiFENesin, ibuprofen , ondansetron  (ZOFRAN ) IV, oxyCODONE   Antimicrobials from admission:  Anti-infectives (From admission, onward)    Start     Dose/Rate Route Frequency Ordered Stop   08/09/24 2000  vancomycin (VANCOREADY) IVPB 750 mg/150 mL  Status:  Discontinued        750 mg 150 mL/hr over 60 Minutes Intravenous Every 24 hours 08/08/24 2003 08/09/24 0736   08/09/24 2000  vancomycin (  VANCOCIN) IVPB 1000 mg/200 mL premix        1,000 mg 200 mL/hr over 60 Minutes Intravenous Every 24 hours 08/09/24 0736     08/09/24 0330  piperacillin -tazobactam (ZOSYN ) IVPB 3.375 g        3.375 g 12.5 mL/hr over 240 Minutes Intravenous Every 8 hours 08/08/24 1955     08/08/24 2000  vancomycin (VANCOREADY) IVPB 1250 mg/250 mL        1,250 mg 166.7 mL/hr over 90 Minutes Intravenous  Once 08/08/24 1954 08/08/24 2235   08/08/24 1915  vancomycin (VANCOCIN) IVPB 1000 mg/200 mL premix  Status:  Discontinued        1,000 mg 200 mL/hr over 60 Minutes Intravenous  Once 08/08/24 1901 08/08/24 1954   08/08/24 1915  piperacillin -tazobactam (ZOSYN ) IVPB 3.375 g        3.375 g 100 mL/hr over 30 Minutes Intravenous  Once 08/08/24 1901 08/08/24 2017           Data Reviewed:  I have personally reviewed the following...  CBC: Recent Labs  Lab 08/08/24 1618 08/09/24 0508 08/10/24 0333  WBC 8.4 6.0 5.8  NEUTROABS 6.3  --   --   HGB 11.8* 10.3* 10.5*  HCT 37.9 32.9* 34.1*  MCV 83.7 83.5 83.6  PLT 288 225 225   Basic Metabolic Panel: Recent Labs  Lab 08/08/24 1618 08/09/24 0508 08/10/24 0333  NA 140 141 139   K 2.7* 3.2* 3.6  CL 96* 103 102  CO2 29 29 28   GLUCOSE 134* 103* 135*  BUN 13 11 12   CREATININE 0.91 0.65 0.85  CALCIUM 8.9 8.4* 8.5*  MG 2.1  --   --   PHOS 3.1  --   --    GFR: Estimated Creatinine Clearance: 48.8 mL/min (by C-G formula based on SCr of 0.85 mg/dL). Liver Function Tests: Recent Labs  Lab 08/08/24 1618  AST 56*  ALT 58*  ALKPHOS 99  BILITOT 0.6  PROT 6.9  ALBUMIN 3.0*   No results for input(s): LIPASE, AMYLASE in the last 168 hours. No results for input(s): AMMONIA in the last 168 hours. Coagulation Profile: Recent Labs  Lab 08/09/24 0508  INR 1.2   Cardiac Enzymes: No results for input(s): CKTOTAL, CKMB, CKMBINDEX, TROPONINI in the last 168 hours. BNP (last 3 results) No results for input(s): PROBNP in the last 8760 hours. HbA1C: No results for input(s): HGBA1C in the last 72 hours. CBG: Recent Labs  Lab 08/09/24 0813 08/10/24 0741 08/11/24 0750 08/11/24 1141  GLUCAP 87 78 95 104*   Lipid Profile: No results for input(s): CHOL, HDL, LDLCALC, TRIG, CHOLHDL, LDLDIRECT in the last 72 hours. Thyroid  Function Tests: No results for input(s): TSH, T4TOTAL, FREET4, T3FREE, THYROIDAB in the last 72 hours. Anemia Panel: No results for input(s): VITAMINB12, FOLATE, FERRITIN, TIBC, IRON , RETICCTPCT in the last 72 hours. Most Recent Urinalysis On File:     Component Value Date/Time   COLORURINE AMBER (A) 05/17/2024 1934   APPEARANCEUR CLEAR (A) 05/17/2024 1934   APPEARANCEUR Cloudy (A) 04/16/2023 1424   LABSPEC 1.019 05/17/2024 1934   PHURINE 6.0 05/17/2024 1934   GLUCOSEU NEGATIVE 05/17/2024 1934   HGBUR LARGE (A) 05/17/2024 1934   BILIRUBINUR NEGATIVE 05/17/2024 1934   BILIRUBINUR Negative 04/16/2023 1424   KETONESUR 5 (A) 05/17/2024 1934   PROTEINUR 100 (A) 05/17/2024 1934   NITRITE NEGATIVE 05/17/2024 1934   LEUKOCYTESUR TRACE (A) 05/17/2024 1934   Sepsis  Labs: @LABRCNTIP (procalcitonin:4,lacticidven:4) Microbiology: Recent Results (from the past  240 hours)  Culture, blood (routine x 2)     Status: None (Preliminary result)   Collection Time: 08/08/24  6:55 PM   Specimen: BLOOD  Result Value Ref Range Status   Specimen Description BLOOD RIGHT ANTECUBITAL  Final   Special Requests   Final    BOTTLES DRAWN AEROBIC AND ANAEROBIC Blood Culture results may not be optimal due to an inadequate volume of blood received in culture bottles   Culture   Final    NO GROWTH 3 DAYS Performed at [facility], [address], [place], [place] [address]    Report Status PENDING  Incomplete  Culture, blood (routine x 2)     Status: None (Preliminary result)   Collection Time: 08/08/24  7:20 PM   Specimen: BLOOD  Result Value Ref Range Status   Specimen Description BLOOD BLOOD RIGHT HAND  Final   Special Requests   Final    BOTTLES DRAWN AEROBIC AND ANAEROBIC Blood Culture results may not be optimal due to an inadequate volume of blood received in culture bottles   Culture   Final    NO GROWTH 3 DAYS Performed at [facility], [address] [place], [place] [address]    Report Status PENDING  Incomplete  Aerobic/Anaerobic Culture w Gram Stain (surgical/deep wound)     Status: None (Preliminary result)   Collection Time: 08/09/24 12:09 PM   Specimen: Wound; Tissue  Result Value Ref Range Status   Specimen Description   Final    TISSUE BUTTOCKS Performed at [facility], [address], [place], [place] [address]    Special Requests   Final    NONE Performed at [facility], [address] [place], [place] [address]    Gram Stain   Final    WBC PRESENT, PREDOMINANTLY PMN RARE GRAM POSITIVE COCCI RARE GRAM VARIABLE ROD    Culture   Final    FEW GRAM NEGATIVE RODS IDENTIFICATION AND SUSCEPTIBILITIES TO FOLLOW HOLDING FOR POSSIBLE ANAEROBE Performed at [facility], [address], [place],  [place] [address]    Report Status PENDING  Incomplete  Aerobic/Anaerobic Culture w Gram Stain (surgical/deep wound)     Status: None (Preliminary result)   Collection Time: 08/09/24 12:09 PM   Specimen: PATH Bone biopsy; Tissue  Result Value Ref Range Status   Specimen Description   Final    TISSUE Performed at [facility], [address], [place], [place] [address]    Special Requests   Final    BONE BOIPSY Performed at [facility], [address] [place], [place] [address]    Gram Stain   Final    RARE WBC PRESENT, PREDOMINANTLY PMN NO ORGANISMS SEEN Performed at [facility], [address], [place], [place] [address]    Culture   Final    RARE GRAM NEGATIVE RODS IDENTIFICATION AND SUSCEPTIBILITIES TO FOLLOW NO ANAEROBES ISOLATED; CULTURE IN PROGRESS FOR 5 DAYS    Report Status PENDING  Incomplete      Radiology Studies last 3 days: DG Humerus Left Result Date: 08/10/2024 CLINICAL DATA:  [id number] Fracture, humerus [id number] EXAM: LEFT HUMERUS - 2+ VIEW COMPARISON:  June 09, 2024 FINDINGS: Revisualization of a fracture of the proximal humeral shaft. There is persistent foreshortening and mild angulation. There is persistent displacement of the distal fragment anteriorly and laterally. There has been progressive callus formation. Osteopenia. Similar appearance of a fracture of the humeral head with increased  adjacent callus formation along the greater tubercle. IMPRESSION: Healing humeral fractures in similar alignment. Electronically Signed   By: Corean Salter M.D.   On: 08/10/2024 16:51          Krist Rosenboom, DO [facility] 08/11/2024, 4:19 PM    Dictation software may have been used to generate the above note. Typos may occur and escape review in typed/dictated notes. Please contact Dr Marsa directly for clarity if needed.  Staff may message me via secure chat in Epic  but this may not receive an immediate response,  please  page me for urgent matters!  If 7PM-7AM, please contact night coverage [email or web address]

## 2024-08-11 NOTE — Consult Note (Addendum)
 WOC Nurse Consult Note: Reason for Consult: Apply NPWT on sacrum and Right glutea. Wound type: PI stage 4 Pressure Injury POA: Yes Measurement: wound connected. Total 13 cm x 8 cm x 4 cm Wound bed: 60% red viable granulate tissue, 40% yellow slough, partial bone exposition on sacrum. Drainage (amount, consistency, odor) serosanguinous, no odor, moderate. Periwound: intact. Bleanchable area at 6 to 8 o'clock peri-wound on sacrum. Dressing procedure/placement/frequency: Removed old NPWT dressing Cleansed wound with normal saline Periwound skin protected with skin barrier wipe. Half ring used on the base of the sacrum wound edge to promote a better seal.  Filled wound with 1 piece of black foam  Sealed NPWT dressing at HG Patient received IV pain medication per bedside nurse prior to dressing change Patient tolerated procedure well  WOC nurse will continue to provide NPWT dressing changed due to the complexity of the dressing change.   WOC team will follow MON/THURS. Please reconsult if further assistance is needed. Thank-you,  Lela Holm RN, CNS, [employer], MSN.  (Phone [phone])

## 2024-08-11 NOTE — Plan of Care (Signed)

## 2024-08-12 DIAGNOSIS — S31000D Unspecified open wound of lower back and pelvis without penetration into retroperitoneum, subsequent encounter: Secondary | ICD-10-CM | POA: Diagnosis not present

## 2024-08-12 LAB — GLUCOSE, CAPILLARY: Glucose-Capillary: 92 mg/dL (ref 70–99)

## 2024-08-12 MED ORDER — SODIUM CHLORIDE 0.9 % IV SOLN
1.0000 g | Freq: Three times a day (TID) | INTRAVENOUS | Status: DC
  Administered 2024-08-12 – 2024-08-14 (×7): 1 g via INTRAVENOUS
  Filled 2024-08-12 (×7): qty 20

## 2024-08-12 NOTE — Progress Notes (Signed)
 Giving the methadone  to Blanch,RN for patients 2200 dose

## 2024-08-12 NOTE — Plan of Care (Signed)

## 2024-08-12 NOTE — Progress Notes (Signed)
 PROGRESS NOTE    Kim Graham   FMW:[id number] DOB: 1951-02-29  DOA: 08/08/2024 Date of Service: 08/12/24 which is hospital day 4  PCP: Vicci Duwaine SQUIBB, DO    Hospital course / significant events:   HPI: Kim Graham is a 73 y.o. female with medical history significant of chronic pain on methadone , COPD, diet-controled type 2 diabetes, history of HCV status posttreatment, dCHF, depression, CKD-3A, bile leakage, chronic venous insufficiency, kidney stone, chronic hypotension on midodrine , who presents with sacral wound infection. Pt has hx of pilonidal cyst and underwent extensive resection in the past, with a chronic sacral wound in right buttock area. Pt is following up with Dr. Marinda of surgery. Pt states that she has worsening pain in the right buttock wound recently.  The pain is constant, aching, severe, nonradiating, aggravated by movement.  Pt was seen by Dr. Marinda today who recommended hospital admission and operative debridement.   09/30: admitted to hospitalist service w/ surgical consult, plan for OR tomorrow for debridement. IV abx  10/01: to OR today. Excisional debridement of sacral decubitus ulcer and right buttock wound measuring 11 cm x 11 cm x 6 cm with debridement through skin, subcutaneous tissue, muscle, fascia and bone. Cultures and biopsies R buttock tissue and sacral bone pending.  10/02: Tissue cultures and biopsies taken in OR - pending final reports but thus far tissue (+)rare GPC and rare gram variable rod, bone no organisms seen. Surgery team planning possible wound vac placement, continue monitor for now and await culture / pathology 10/03: BCx NGx3d, bone bx/cx and wound cx reincubated for better growth. Wound vac placed today, per surgery plan for monitor thru the weekend and plan discharge Mon 10/04: (+)acinetobacter and Ecoli, switching to meropenem abx      Consultants:  General surgery  Procedures/Surgeries: 08/09/24 Excisional debridement of  sacral decubitus ulcer and buttocks ulcer w/ Dr Marinda      ASSESSMENT & PLAN:   Sacral wound with infection, not meeting sepsis criteria S/p debridement sacral/buttock wound  Methadone  dependence due to chronic pain (+)acinetobacter and Ecoli Blood cultures x 2 NG x3d Vancomycin and zosyn  --> meropenem 08/12/24  Wound Care: Wound vac placed 08/11/24  Continue pressure offloading, frequent repositioning, low air loss mattress  continue home methadone  for chronic pain + added as needed oxycodone  and ibuprofen  for pain - caution given hypotension and opiates    Chronic hypotension:  midodrine  10 mg 3 times daily   Hypokalemia Replace as needed Monitor BMP  Chronic diastolic CHF (congestive heart failure) not in exacerbation:  2D echo 07/29/2020 showed EF 60 to 65% with grade 1 diastolic dysfunction.  Patient does not have SOB, only has trace leg edema, CHF seems to be compensated. Hold Lasix  due to soft blood pressure Check BNP - minimal elevation into 200s I&O, daily weights   Chronic kidney disease, stage 3a:   GFR> 60 today Follow-up with BMP   Chronic obstructive pulmonary disease : Stable bronchodilators and prn Mucinex   Iron  deficiency anemia: Hemoglobin stable 11.8 Continue iron  supplement   Hep C w/o coma, chronic S/p treatment.  AST 56, ALT 58, total bilirubin 0.6  Avoid using Tylenol  / other hepatotoxins    Depression, recurrent Current continue home medications, Abilify       No concerns based on BMI: Body mass index is 19.55 kg/m.[name] Significantly low or high BMI is associated with higher medical risk.  Underweight - under 18  overweight - 25 to 29 obese - 30 or  more Class 1 obesity: BMI of 30.0 to 34 Class 2 obesity: BMI of 35.0 to 39 Class 3 obesity: BMI of 40.0 to 49 Super Morbid Obesity: BMI 50-59 Super-super Morbid Obesity: BMI 60+ Healthy nutrition and physical activity advised as adjunct to other disease management and risk reduction  treatments    DVT prophylaxis: heparin IV fluids: none Nutrition: resuem diet postop heart healthy Central lines / other devices: none  Code Status: FULL CODE ACP documentation reviewed:  none on file in [facility]  TOC needs: TBD Medical barriers to dispo: IV meropenem. Expected medical readiness for discharge next few days / pending surgical clearance likely Monday              Subjective / Brief ROS:  Patient reports persistent pain today  Denies CP/SOB.  Denies new weakness. .    Family Communication: none at this time     Objective Findings:  Vitals:   08/11/24 1936 08/12/24 0359 08/12/24 0441 08/12/24 0729  BP: (!) 138/49 (!) 110/54  (!) 132/54  Pulse: (!) 51 (!) 57  65  Resp: 16 16  16   Temp: 98 F (36.7 C) 98.2 F (36.8 C)  98.1 F (36.7 C)  TempSrc: Oral Oral  Oral  SpO2: 95% 94%  97%  Weight:   91 kg   Height:        Intake/Output Summary (Last 24 hours) at 08/12/2024 1313 Last data filed at 08/12/2024 1023 Gross per 24 hour  Intake 465.31 ml  Output 1250 ml  Net -784.69 ml   Filed Weights   08/09/24 0548 08/10/24 0500 08/12/24 0441  Weight: 51.7 kg 51.7 kg 91 kg    Examination:  Physical Exam Constitutional:      General: She is not in acute distress. Cardiovascular:     Rate and Rhythm: Normal rate and regular rhythm.  Pulmonary:     Effort: Pulmonary effort is normal.     Breath sounds: Normal breath sounds.  Abdominal:     Palpations: Abdomen is soft.  Skin:    General: Skin is warm and dry.  Neurological:     Mental Status: She is alert.          Scheduled Medications:   (feeding supplement) PROSource Plus  30 mL Oral BID BM   ARIPiprazole   30 mg Oral Daily   ascorbic acid   500 mg Oral Daily   cyanocobalamin   500 mcg Oral Daily   heparin  5,000 Units Subcutaneous Q8H   iron  polysaccharides  150 mg Oral Daily   methadone   50 mg Oral Q12H   midodrine   10 mg Oral TID WC   multivitamin with minerals  1 tablet Oral  Daily   potassium chloride   40 mEq Oral Once    Continuous Infusions:  piperacillin -tazobactam (ZOSYN )  IV 3.375 g (08/12/24 1220)   vancomycin Stopped (08/11/24 2223)    PRN Medications:  albuterol , dextromethorphan-guaiFENesin, ibuprofen , ondansetron  (ZOFRAN ) IV, oxyCODONE   Antimicrobials from admission:  Anti-infectives (From admission, onward)    Start     Dose/Rate Route Frequency Ordered Stop   08/09/24 2000  vancomycin (VANCOREADY) IVPB 750 mg/150 mL  Status:  Discontinued        750 mg 150 mL/hr over 60 Minutes Intravenous Every 24 hours 08/08/24 2003 08/09/24 0736   08/09/24 2000  vancomycin (VANCOCIN) IVPB 1000 mg/200 mL premix        1,000 mg 200 mL/hr over 60 Minutes Intravenous Every 24 hours 08/09/24 0736  08/09/24 0330  piperacillin -tazobactam (ZOSYN ) IVPB 3.375 g        3.375 g 12.5 mL/hr over 240 Minutes Intravenous Every 8 hours 08/08/24 1955     08/08/24 2000  vancomycin (VANCOREADY) IVPB 1250 mg/250 mL        1,250 mg 166.7 mL/hr over 90 Minutes Intravenous  Once 08/08/24 1954 08/08/24 2235   08/08/24 1915  vancomycin (VANCOCIN) IVPB 1000 mg/200 mL premix  Status:  Discontinued        1,000 mg 200 mL/hr over 60 Minutes Intravenous  Once 08/08/24 1901 08/08/24 1954   08/08/24 1915  piperacillin -tazobactam (ZOSYN ) IVPB 3.375 g        3.375 g 100 mL/hr over 30 Minutes Intravenous  Once 08/08/24 1901 08/08/24 2017           Data Reviewed:  I have personally reviewed the following...  CBC: Recent Labs  Lab 08/08/24 1618 08/09/24 0508 08/10/24 0333  WBC 8.4 6.0 5.8  NEUTROABS 6.3  --   --   HGB 11.8* 10.3* 10.5*  HCT 37.9 32.9* 34.1*  MCV 83.7 83.5 83.6  PLT 288 225 225   Basic Metabolic Panel: Recent Labs  Lab 08/08/24 1618 08/09/24 0508 08/10/24 0333  NA 140 141 139  K 2.7* 3.2* 3.6  CL 96* 103 102  CO2 29 29 28   GLUCOSE 134* 103* 135*  BUN 13 11 12   CREATININE 0.91 0.65 0.85  CALCIUM 8.9 8.4* 8.5*  MG 2.1  --   --   PHOS 3.1   --   --    GFR: Estimated Creatinine Clearance: 65.4 mL/min (by C-G formula based on SCr of 0.85 mg/dL). Liver Function Tests: Recent Labs  Lab 08/08/24 1618  AST 56*  ALT 58*  ALKPHOS 99  BILITOT 0.6  PROT 6.9  ALBUMIN 3.0*   No results for input(s): LIPASE, AMYLASE in the last 168 hours. No results for input(s): AMMONIA in the last 168 hours. Coagulation Profile: Recent Labs  Lab 08/09/24 0508  INR 1.2   Cardiac Enzymes: No results for input(s): CKTOTAL, CKMB, CKMBINDEX, TROPONINI in the last 168 hours. BNP (last 3 results) No results for input(s): PROBNP in the last 8760 hours. HbA1C: No results for input(s): HGBA1C in the last 72 hours. CBG: Recent Labs  Lab 08/10/24 0741 08/11/24 0750 08/11/24 1141 08/11/24 2102 08/12/24 0730  GLUCAP 78 95 104* 138* 92   Lipid Profile: No results for input(s): CHOL, HDL, LDLCALC, TRIG, CHOLHDL, LDLDIRECT in the last 72 hours. Thyroid  Function Tests: No results for input(s): TSH, T4TOTAL, FREET4, T3FREE, THYROIDAB in the last 72 hours. Anemia Panel: No results for input(s): VITAMINB12, FOLATE, FERRITIN, TIBC, IRON , RETICCTPCT in the last 72 hours. Most Recent Urinalysis On File:     Component Value Date/Time   COLORURINE AMBER (A) 05/17/2024 1934   APPEARANCEUR CLEAR (A) 05/17/2024 1934   APPEARANCEUR Cloudy (A) 04/16/2023 1424   LABSPEC 1.019 05/17/2024 1934   PHURINE 6.0 05/17/2024 1934   GLUCOSEU NEGATIVE 05/17/2024 1934   HGBUR LARGE (A) 05/17/2024 1934   BILIRUBINUR NEGATIVE 05/17/2024 1934   BILIRUBINUR Negative 04/16/2023 1424   KETONESUR 5 (A) 05/17/2024 1934   PROTEINUR 100 (A) 05/17/2024 1934   NITRITE NEGATIVE 05/17/2024 1934   LEUKOCYTESUR TRACE (A) 05/17/2024 1934   Sepsis Labs: @LABRCNTIP (procalcitonin:4,lacticidven:4) Microbiology: Recent Results (from the past 240 hours)  Culture, blood (routine x 2)     Status: None (Preliminary result)    Collection Time: 08/08/24  6:55 PM   Specimen:  BLOOD  Result Value Ref Range Status   Specimen Description BLOOD RIGHT ANTECUBITAL  Final   Special Requests   Final    BOTTLES DRAWN AEROBIC AND ANAEROBIC Blood Culture results may not be optimal due to an inadequate volume of blood received in culture bottles   Culture   Final    NO GROWTH 4 DAYS Performed at [facility], [address], [place], [place] [address]    Report Status PENDING  Incomplete  Culture, blood (routine x 2)     Status: None (Preliminary result)   Collection Time: 08/08/24  7:20 PM   Specimen: BLOOD  Result Value Ref Range Status   Specimen Description BLOOD BLOOD RIGHT HAND  Final   Special Requests   Final    BOTTLES DRAWN AEROBIC AND ANAEROBIC Blood Culture results may not be optimal due to an inadequate volume of blood received in culture bottles   Culture   Final    NO GROWTH 4 DAYS Performed at [facility], [address] [place], [place] [address]    Report Status PENDING  Incomplete  Aerobic/Anaerobic Culture w Gram Stain (surgical/deep wound)     Status: None (Preliminary result)   Collection Time: 08/09/24 12:09 PM   Specimen: Wound; Tissue  Result Value Ref Range Status   Specimen Description   Final    TISSUE BUTTOCKS Performed at [facility], [address], [place], [place] [address]    Special Requests   Final    NONE Performed at [facility], [address] [place], [place] [address]    Gram Stain   Final    WBC PRESENT, PREDOMINANTLY PMN RARE GRAM POSITIVE COCCI RARE GRAM VARIABLE ROD    Culture   Final    FEW ESCHERICHIA COLI FEW ACINETOBACTER CALCOACETICUS/BAUMANNII COMPLEX HOLDING FOR POSSIBLE ANAEROBE Performed at [facility], [address], [place], [place] [address]    Report Status PENDING  Incomplete   Organism ID, Bacteria ESCHERICHIA COLI  Final   Organism ID, Bacteria ACINETOBACTER CALCOACETICUS/BAUMANNII COMPLEX  Final       Susceptibility   Acinetobacter calcoaceticus/baumannii complex - MIC*    PIP/TAZO Value in next row Intermediate      32 INTERMEDIATEThis is a modified FDA-approved test that has been validated and its performance characteristics determined by the reporting laboratory.  This laboratory is certified under the Clinical Laboratory Improvement Amendments CLIA as qualified to perform high complexity clinical laboratory testing.    AMPICILLIN /SULBACTAM Value in next row Sensitive      32 INTERMEDIATEThis is a modified FDA-approved test that has been validated and its performance characteristics determined by the reporting laboratory.  This laboratory is certified under the Clinical Laboratory Improvement Amendments CLIA as qualified to perform high complexity clinical laboratory testing.    MEROPENEM Value in next row Sensitive      32 INTERMEDIATEThis is a modified FDA-approved test that has been validated and its performance characteristics determined by the reporting laboratory.  This laboratory is certified under the Clinical Laboratory Improvement Amendments CLIA as qualified to perform high complexity clinical laboratory testing.    * FEW ACINETOBACTER CALCOACETICUS/BAUMANNII COMPLEX   Escherichia coli - MIC*    AMPICILLIN  Value in next row Sensitive      32 INTERMEDIATEThis is a modified FDA-approved test that has been validated and its performance characteristics determined by the reporting laboratory.  This laboratory is certified under the Clinical Laboratory Improvement Amendments CLIA as qualified to perform  high complexity clinical laboratory testing.    CEFAZOLIN  (NON-URINE) Value in next row Sensitive      32 INTERMEDIATEThis is a modified FDA-approved test that has been validated and its performance characteristics determined by the reporting laboratory.  This laboratory is certified under the Clinical Laboratory Improvement Amendments CLIA as qualified to perform high complexity  clinical laboratory testing.    CEFEPIME  Value in next row Sensitive      32 INTERMEDIATEThis is a modified FDA-approved test that has been validated and its performance characteristics determined by the reporting laboratory.  This laboratory is certified under the Clinical Laboratory Improvement Amendments CLIA as qualified to perform high complexity clinical laboratory testing.    ERTAPENEM Value in next row Sensitive      32 INTERMEDIATEThis is a modified FDA-approved test that has been validated and its performance characteristics determined by the reporting laboratory.  This laboratory is certified under the Clinical Laboratory Improvement Amendments CLIA as qualified to perform high complexity clinical laboratory testing.    CEFTRIAXONE  Value in next row Sensitive      32 INTERMEDIATEThis is a modified FDA-approved test that has been validated and its performance characteristics determined by the reporting laboratory.  This laboratory is certified under the Clinical Laboratory Improvement Amendments CLIA as qualified to perform high complexity clinical laboratory testing.    CIPROFLOXACIN  Value in next row Resistant      32 INTERMEDIATEThis is a modified FDA-approved test that has been validated and its performance characteristics determined by the reporting laboratory.  This laboratory is certified under the Clinical Laboratory Improvement Amendments CLIA as qualified to perform high complexity clinical laboratory testing.    GENTAMICIN  Value in next row Sensitive      32 INTERMEDIATEThis is a modified FDA-approved test that has been validated and its performance characteristics determined by the reporting laboratory.  This laboratory is certified under the Clinical Laboratory Improvement Amendments CLIA as qualified to perform high complexity clinical laboratory testing.    MEROPENEM Value in next row Sensitive      32 INTERMEDIATEThis is a modified FDA-approved test that has been validated and  its performance characteristics determined by the reporting laboratory.  This laboratory is certified under the Clinical Laboratory Improvement Amendments CLIA as qualified to perform high complexity clinical laboratory testing.    TRIMETH /SULFA  Value in next row Resistant      32 INTERMEDIATEThis is a modified FDA-approved test that has been validated and its performance characteristics determined by the reporting laboratory.  This laboratory is certified under the Clinical Laboratory Improvement Amendments CLIA as qualified to perform high complexity clinical laboratory testing.    AMPICILLIN /SULBACTAM Value in next row Sensitive      32 INTERMEDIATEThis is a modified FDA-approved test that has been validated and its performance characteristics determined by the reporting laboratory.  This laboratory is certified under the Clinical Laboratory Improvement Amendments CLIA as qualified to perform high complexity clinical laboratory testing.    PIP/TAZO Value in next row Sensitive      <=4 SENSITIVEThis is a modified FDA-approved test that has been validated and its performance characteristics determined by the reporting laboratory.  This laboratory is certified under the Clinical Laboratory Improvement Amendments CLIA as qualified to perform high complexity clinical laboratory testing.    * FEW ESCHERICHIA COLI  Aerobic/Anaerobic Culture w Gram Stain (surgical/deep wound)     Status: None (Preliminary result)   Collection Time: 08/09/24 12:09 PM   Specimen: PATH Bone biopsy; Tissue  Result Value Ref Range  Status   Specimen Description   Final    TISSUE Performed at [facility], [address] [place], [place] [address]    Special Requests   Final    BONE BOIPSY Performed at [facility], [address] [place], [place] [address]    Gram Stain   Final    RARE WBC PRESENT, PREDOMINANTLY PMN NO ORGANISMS SEEN    Culture   Final    RARE ESCHERICHIA COLI RARE  DIPHTHEROIDS(CORYNEBACTERIUM SPECIES) IDENTIFICATION TO FOLLOW Standardized susceptibility testing for this organism is not available. Performed at [facility], [address] [place], [place] [address]    Report Status PENDING  Incomplete   Organism ID, Bacteria ESCHERICHIA COLI  Final      Susceptibility   Escherichia coli - MIC*    AMPICILLIN  8 SENSITIVE Sensitive     CEFAZOLIN  (NON-URINE) 2 SENSITIVE Sensitive     CEFEPIME  <=0.12 SENSITIVE Sensitive     ERTAPENEM <=0.12 SENSITIVE Sensitive     CEFTRIAXONE  <=0.25 SENSITIVE Sensitive     CIPROFLOXACIN  >=4 RESISTANT Resistant     GENTAMICIN  <=1 SENSITIVE Sensitive     MEROPENEM <=0.25 SENSITIVE Sensitive     TRIMETH /SULFA  <=20 SENSITIVE Sensitive     AMPICILLIN /SULBACTAM <=2 SENSITIVE Sensitive     PIP/TAZO Value in next row Sensitive      <=4 SENSITIVEThis is a modified FDA-approved test that has been validated and its performance characteristics determined by the reporting laboratory.  This laboratory is certified under the Clinical Laboratory Improvement Amendments CLIA as qualified to perform high complexity clinical laboratory testing.    * RARE ESCHERICHIA COLI      Radiology Studies last 3 days: DG Humerus Left Result Date: 08/10/2024 CLINICAL DATA:  [id number] Fracture, humerus [id number] EXAM: LEFT HUMERUS - 2+ VIEW COMPARISON:  June 09, 2024 FINDINGS: Revisualization of a fracture of the proximal humeral shaft. There is persistent foreshortening and mild angulation. There is persistent displacement of the distal fragment anteriorly and laterally. There has been progressive callus formation. Osteopenia. Similar appearance of a fracture of the humeral head with increased adjacent callus formation along the greater tubercle. IMPRESSION: Healing humeral fractures in similar alignment. Electronically Signed   By: Corean Salter M.D.   On: 08/10/2024 16:51          Konor Noren, DO [facility] 08/12/2024, 1:13  PM    Dictation software may have been used to generate the above note. Typos may occur and escape review in typed/dictated notes. Please contact Dr Marsa directly for clarity if needed.  Staff may message me via secure chat in Epic  but this may not receive an immediate response,  please page me for urgent matters!  If 7PM-7AM, please contact night coverage [email or web address]

## 2024-08-13 ENCOUNTER — Encounter: Payer: Self-pay | Admitting: General Surgery

## 2024-08-13 DIAGNOSIS — S31000D Unspecified open wound of lower back and pelvis without penetration into retroperitoneum, subsequent encounter: Secondary | ICD-10-CM | POA: Diagnosis not present

## 2024-08-13 LAB — CREATININE, SERUM
Creatinine, Ser: 0.66 mg/dL (ref 0.44–1.00)
GFR, Estimated: >60 mL/min (ref >60)

## 2024-08-13 LAB — AEROBIC/ANAEROBIC CULTURE W GRAM STAIN (SURGICAL/DEEP WOUND)

## 2024-08-13 LAB — CULTURE, BLOOD (ROUTINE X 2)
Culture: NO GROWTH
Culture: NO GROWTH

## 2024-08-13 MED ORDER — FUROSEMIDE 20 MG PO TABS
20.0000 mg | ORAL_TABLET | Freq: Every day | ORAL | Status: DC
  Administered 2024-08-14 – 2024-08-17 (×4): 20 mg via ORAL
  Filled 2024-08-13 (×4): qty 1

## 2024-08-13 MED ORDER — OXYCODONE HCL 5 MG PO TABS
5.0000 mg | ORAL_TABLET | ORAL | Status: DC | PRN
  Administered 2024-08-13 – 2024-08-14 (×5): 5 mg via ORAL
  Filled 2024-08-13 (×5): qty 1

## 2024-08-13 MED ORDER — POTASSIUM CHLORIDE CRYS ER 10 MEQ PO TBCR
10.0000 meq | EXTENDED_RELEASE_TABLET | Freq: Two times a day (BID) | ORAL | Status: DC
  Administered 2024-08-14 (×2): 10 meq via ORAL
  Filled 2024-08-13 (×2): qty 1

## 2024-08-13 NOTE — Progress Notes (Signed)
 Liquid methadone  handed off to marsha,RN

## 2024-08-13 NOTE — Plan of Care (Signed)

## 2024-08-13 NOTE — Plan of Care (Signed)
  Problem: Education: Goal: Knowledge of General Education information will improve Description: Including pain rating scale, medication(s)/side effects and non-pharmacologic comfort measures Outcome: Progressing   Problem: Clinical Measurements: Goal: Ability to maintain clinical measurements within normal limits will improve Outcome: Progressing   Problem: Pain Managment: Goal: General experience of comfort will improve and/or be controlled Outcome: Progressing   Problem: Skin Integrity: Goal: Risk for impaired skin integrity will decrease Outcome: Progressing   Problem: Clinical Measurements: Goal: Ability to avoid or minimize complications of infection will improve Outcome: Progressing   Problem: Skin Integrity: Goal: Skin integrity will improve Outcome: Progressing

## 2024-08-13 NOTE — Progress Notes (Signed)
 PROGRESS NOTE    Kim Graham   FMW:[id number] DOB: [birth year]  DOA: 08/08/2024 Date of Service: 08/13/24 which is hospital day 5  PCP: Vicci Duwaine SQUIBB, DO    Hospital course / significant events:   HPI: Kim Graham is a 73 y.o. female with medical history significant of chronic pain on methadone , COPD, diet-controled type 2 diabetes, history of HCV status posttreatment, dCHF, depression, CKD-3A, bile leakage, chronic venous insufficiency, kidney stone, chronic hypotension on midodrine , who presents with sacral wound infection. Pt has hx of pilonidal cyst and underwent extensive resection in the past, with a chronic sacral wound in right buttock area. Pt is following up with Dr. Marinda of surgery. Pt states that she has worsening pain in the right buttock wound recently.  The pain is constant, aching, severe, nonradiating, aggravated by movement.  Pt was seen by Dr. Marinda today who recommended hospital admission and operative debridement.   09/30: admitted to hospitalist service w/ surgical consult, plan for OR tomorrow for debridement. IV abx  10/01: to OR today. Excisional debridement of sacral decubitus ulcer and right buttock wound measuring 11 cm x 11 cm x 6 cm with debridement through skin, subcutaneous tissue, muscle, fascia and bone. Cultures and biopsies R buttock tissue and sacral bone pending.  10/02: Tissue cultures and biopsies taken in OR - pending final reports but thus far tissue (+)rare GPC and rare gram variable rod, bone no organisms seen. Surgery team planning possible wound vac placement, continue monitor for now and await culture / pathology 10/03: BCx NGx3d, bone bx/cx and wound cx reincubated for better growth. Wound vac placed today, per surgery plan for monitor thru the weekend and plan discharge Mon 10/04: (+)acinetobacter and Ecoli, switching to meropenem abx  10/05: stable      Consultants:  General surgery  Procedures/Surgeries: 08/09/24 Excisional  debridement of sacral decubitus ulcer and buttocks ulcer w/ Dr Marinda      ASSESSMENT & PLAN:   Sacral wound with infection, not meeting sepsis criteria S/p debridement sacral/buttock wound  Methadone  dependence due to chronic pain (+)acinetobacter and Ecoli Blood cultures x 2 NG x3d Vancomycin and zosyn  --> meropenem 08/12/24  Wound Care: Wound vac placed 08/11/24  Continue pressure offloading, frequent repositioning, low air loss mattress  continue home methadone  for chronic pain + added as needed oxycodone  and ibuprofen  for pain - caution given hypotension and opiates    Chronic hypotension:  midodrine  10 mg 3 times daily   Hypokalemia Replace as needed Monitor BMP  Chronic diastolic CHF (congestive heart failure) not in exacerbation:  2D echo 07/29/2020 showed EF 60 to 65% with grade 1 diastolic dysfunction.  Patient does not have SOB, only has trace leg edema, CHF seems to be compensated. Cautious restart home lasix  tomorrow  BNP - minimal elevation into 200s I&O, daily weights   Chronic kidney disease, stage 3a:   GFR> 60 today Follow-up with BMP   Chronic obstructive pulmonary disease : Stable bronchodilators and prn Mucinex   Iron  deficiency anemia: Hemoglobin stable 11.8 Continue iron  supplement   Hep C w/o coma, chronic S/p treatment.  AST 56, ALT 58, total bilirubin 0.6  Avoid using Tylenol  / other hepatotoxins    Depression, recurrent Current continue home medications, Abilify       No concerns based on BMI: Body mass index is 19.55 kg/m.[name] Significantly low or high BMI is associated with higher medical risk.  Underweight - under 18  overweight - 25 to 29 obese - 30  or more Class 1 obesity: BMI of 30.0 to 34 Class 2 obesity: BMI of 35.0 to 39 Class 3 obesity: BMI of 40.0 to 49 Super Morbid Obesity: BMI 50-59 Super-super Morbid Obesity: BMI 60+ Healthy nutrition and physical activity advised as adjunct to other disease management and risk reduction  treatments    DVT prophylaxis: heparin IV fluids: none Nutrition: resuem diet postop heart healthy Central lines / other devices: none  Code Status: FULL CODE ACP documentation reviewed:  none on file in [facility]  TOC needs: TBD Medical barriers to dispo: IV meropenem. Expected medical readiness for discharge next few days / pending surgical clearance likely Monday              Subjective / Brief ROS:  No concerns today   Family Communication: none at this time     Objective Findings:  Vitals:   08/13/24 0447 08/13/24 0719 08/13/24 1254 08/13/24 1439  BP:  (!) 131/58 128/62 (!) 130/58  Pulse:  65 72 67  Resp:  17 16 16   Temp:  97.8 F (36.6 C) 98.2 F (36.8 C) 98.7 F (37.1 C)  TempSrc:  Axillary Oral Oral  SpO2:  95% 97% 97%  Weight: 89 kg     Height:        Intake/Output Summary (Last 24 hours) at 08/13/2024 1440 Last data filed at 08/13/2024 1401 Gross per 24 hour  Intake 274.72 ml  Output 1510 ml  Net -1235.28 ml   Filed Weights   08/10/24 0500 08/12/24 0441 08/13/24 0447  Weight: 51.7 kg 91 kg 89 kg    Examination:  Physical Exam Constitutional:      General: She is not in acute distress. Cardiovascular:     Rate and Rhythm: Normal rate.  Pulmonary:     Effort: Pulmonary effort is normal.  Neurological:     Mental Status: She is alert.          Scheduled Medications:   (feeding supplement) PROSource Plus  30 mL Oral BID BM   ARIPiprazole   30 mg Oral Daily   ascorbic acid   500 mg Oral Daily   cyanocobalamin   500 mcg Oral Daily   [START ON 08/14/2024] furosemide   20 mg Oral Daily   heparin  5,000 Units Subcutaneous Q8H   iron  polysaccharides  150 mg Oral Daily   methadone   50 mg Oral Q12H   midodrine   10 mg Oral TID WC   multivitamin with minerals  1 tablet Oral Daily   [START ON 08/14/2024] potassium chloride   10 mEq Oral BID    Continuous Infusions:  meropenem (MERREM) IV 1 g (08/13/24 1209)    PRN Medications:   albuterol , dextromethorphan-guaiFENesin, ibuprofen , ondansetron  (ZOFRAN ) IV, oxyCODONE   Antimicrobials from admission:  Anti-infectives (From admission, onward)    Start     Dose/Rate Route Frequency Ordered Stop   08/12/24 1400  meropenem (MERREM) 1 g in sodium chloride  0.9 % 100 mL IVPB        1 g 200 mL/hr over 30 Minutes Intravenous Every 8 hours 08/12/24 1313     08/09/24 2000  vancomycin (VANCOREADY) IVPB 750 mg/150 mL  Status:  Discontinued        750 mg 150 mL/hr over 60 Minutes Intravenous Every 24 hours 08/08/24 2003 08/09/24 0736   08/09/24 2000  vancomycin (VANCOCIN) IVPB 1000 mg/200 mL premix  Status:  Discontinued        1,000 mg 200 mL/hr over 60 Minutes Intravenous Every 24 hours 08/09/24 0736  08/12/24 1313   08/09/24 0330  piperacillin -tazobactam (ZOSYN ) IVPB 3.375 g  Status:  Discontinued        3.375 g 12.5 mL/hr over 240 Minutes Intravenous Every 8 hours 08/08/24 1955 08/12/24 1313   08/08/24 2000  vancomycin (VANCOREADY) IVPB 1250 mg/250 mL        1,250 mg 166.7 mL/hr over 90 Minutes Intravenous  Once 08/08/24 1954 08/08/24 2235   08/08/24 1915  vancomycin (VANCOCIN) IVPB 1000 mg/200 mL premix  Status:  Discontinued        1,000 mg 200 mL/hr over 60 Minutes Intravenous  Once 08/08/24 1901 08/08/24 1954   08/08/24 1915  piperacillin -tazobactam (ZOSYN ) IVPB 3.375 g        3.375 g 100 mL/hr over 30 Minutes Intravenous  Once 08/08/24 1901 08/08/24 2017           Data Reviewed:  I have personally reviewed the following...  CBC: Recent Labs  Lab 08/08/24 1618 08/09/24 0508 08/10/24 0333  WBC 8.4 6.0 5.8  NEUTROABS 6.3  --   --   HGB 11.8* 10.3* 10.5*  HCT 37.9 32.9* 34.1*  MCV 83.7 83.5 83.6  PLT 288 225 225   Basic Metabolic Panel: Recent Labs  Lab 08/08/24 1618 08/09/24 0508 08/10/24 0333 08/13/24 0544  NA 140 141 139  --   K 2.7* 3.2* 3.6  --   CL 96* 103 102  --   CO2 29 29 28   --   GLUCOSE 134* 103* 135*  --   BUN 13 11 12   --    CREATININE 0.91 0.65 0.85 0.66  CALCIUM 8.9 8.4* 8.5*  --   MG 2.1  --   --   --   PHOS 3.1  --   --   --    GFR: Estimated Creatinine Clearance: 68.6 mL/min (by C-G formula based on SCr of 0.66 mg/dL). Liver Function Tests: Recent Labs  Lab 08/08/24 1618  AST 56*  ALT 58*  ALKPHOS 99  BILITOT 0.6  PROT 6.9  ALBUMIN 3.0*   No results for input(s): LIPASE, AMYLASE in the last 168 hours. No results for input(s): AMMONIA in the last 168 hours. Coagulation Profile: Recent Labs  Lab 08/09/24 0508  INR 1.2   Cardiac Enzymes: No results for input(s): CKTOTAL, CKMB, CKMBINDEX, TROPONINI in the last 168 hours. BNP (last 3 results) No results for input(s): PROBNP in the last 8760 hours. HbA1C: No results for input(s): HGBA1C in the last 72 hours. CBG: Recent Labs  Lab 08/10/24 0741 08/11/24 0750 08/11/24 1141 08/11/24 2102 08/12/24 0730  GLUCAP 78 95 104* 138* 92   Lipid Profile: No results for input(s): CHOL, HDL, LDLCALC, TRIG, CHOLHDL, LDLDIRECT in the last 72 hours. Thyroid  Function Tests: No results for input(s): TSH, T4TOTAL, FREET4, T3FREE, THYROIDAB in the last 72 hours. Anemia Panel: No results for input(s): VITAMINB12, FOLATE, FERRITIN, TIBC, IRON , RETICCTPCT in the last 72 hours. Most Recent Urinalysis On File:     Component Value Date/Time   COLORURINE AMBER (A) 05/17/2024 1934   APPEARANCEUR CLEAR (A) 05/17/2024 1934   APPEARANCEUR Cloudy (A) 04/16/2023 1424   LABSPEC 1.019 05/17/2024 1934   PHURINE 6.0 05/17/2024 1934   GLUCOSEU NEGATIVE 05/17/2024 1934   HGBUR LARGE (A) 05/17/2024 1934   BILIRUBINUR NEGATIVE 05/17/2024 1934   BILIRUBINUR Negative 04/16/2023 1424   KETONESUR 5 (A) 05/17/2024 1934   PROTEINUR 100 (A) 05/17/2024 1934   NITRITE NEGATIVE 05/17/2024 1934   LEUKOCYTESUR TRACE (A) 05/17/2024 1934  Sepsis Labs: @LABRCNTIP (procalcitonin:4,lacticidven:4) Microbiology: Recent Results  (from the past 240 hours)  Culture, blood (routine x 2)     Status: None   Collection Time: 08/08/24  6:55 PM   Specimen: BLOOD  Result Value Ref Range Status   Specimen Description BLOOD RIGHT ANTECUBITAL  Final   Special Requests   Final    BOTTLES DRAWN AEROBIC AND ANAEROBIC Blood Culture results may not be optimal due to an inadequate volume of blood received in culture bottles   Culture   Final    NO GROWTH 5 DAYS Performed at [facility], [address] [place], [place] [address]    Report Status 08/13/2024 FINAL  Final  Culture, blood (routine x 2)     Status: None   Collection Time: 08/08/24  7:20 PM   Specimen: BLOOD  Result Value Ref Range Status   Specimen Description BLOOD BLOOD RIGHT HAND  Final   Special Requests   Final    BOTTLES DRAWN AEROBIC AND ANAEROBIC Blood Culture results may not be optimal due to an inadequate volume of blood received in culture bottles   Culture   Final    NO GROWTH 5 DAYS Performed at [facility], [address], [place], [place] [address]    Report Status 08/13/2024 FINAL  Final  Aerobic/Anaerobic Culture w Gram Stain (surgical/deep wound)     Status: None   Collection Time: 08/09/24 12:09 PM   Specimen: Wound; Tissue  Result Value Ref Range Status   Specimen Description   Final    TISSUE BUTTOCKS Performed at [facility], [address] [place], [place] [address]    Special Requests   Final    NONE Performed at [facility], [address] [place], [place] [address]    Gram Stain   Final    WBC PRESENT, PREDOMINANTLY PMN RARE GRAM POSITIVE COCCI RARE GRAM VARIABLE ROD    Culture   Final    FEW ESCHERICHIA COLI FEW ACINETOBACTER CALCOACETICUS/BAUMANNII COMPLEX FEW BACTEROIDES FRAGILIS BETA LACTAMASE POSITIVE Performed at [facility], [address], [place], [place] [address]    Report Status 08/13/2024 FINAL  Final   Organism ID, Bacteria ESCHERICHIA COLI  Final   Organism  ID, Bacteria ACINETOBACTER CALCOACETICUS/BAUMANNII COMPLEX  Final      Susceptibility   Acinetobacter calcoaceticus/baumannii complex - MIC*    PIP/TAZO Value in next row Intermediate      32 INTERMEDIATEThis is a modified FDA-approved test that has been validated and its performance characteristics determined by the reporting laboratory.  This laboratory is certified under the Clinical Laboratory Improvement Amendments CLIA as qualified to perform high complexity clinical laboratory testing.    AMPICILLIN /SULBACTAM Value in next row Sensitive      32 INTERMEDIATEThis is a modified FDA-approved test that has been validated and its performance characteristics determined by the reporting laboratory.  This laboratory is certified under the Clinical Laboratory Improvement Amendments CLIA as qualified to perform high complexity clinical laboratory testing.    MEROPENEM Value in next row Sensitive      32 INTERMEDIATEThis is a modified FDA-approved test that has been validated and its performance characteristics determined by the reporting laboratory.  This laboratory is certified under the Clinical Laboratory Improvement Amendments CLIA as qualified to perform high complexity clinical laboratory testing.    * FEW ACINETOBACTER CALCOACETICUS/BAUMANNII COMPLEX   Escherichia coli - MIC*    AMPICILLIN  Value in next row Sensitive  32 INTERMEDIATEThis is a modified FDA-approved test that has been validated and its performance characteristics determined by the reporting laboratory.  This laboratory is certified under the Clinical Laboratory Improvement Amendments CLIA as qualified to perform high complexity clinical laboratory testing.    CEFAZOLIN  (NON-URINE) Value in next row Sensitive      32 INTERMEDIATEThis is a modified FDA-approved test that has been validated and its performance characteristics determined by the reporting laboratory.  This laboratory is certified under the Clinical Laboratory  Improvement Amendments CLIA as qualified to perform high complexity clinical laboratory testing.    CEFEPIME  Value in next row Sensitive      32 INTERMEDIATEThis is a modified FDA-approved test that has been validated and its performance characteristics determined by the reporting laboratory.  This laboratory is certified under the Clinical Laboratory Improvement Amendments CLIA as qualified to perform high complexity clinical laboratory testing.    ERTAPENEM Value in next row Sensitive      32 INTERMEDIATEThis is a modified FDA-approved test that has been validated and its performance characteristics determined by the reporting laboratory.  This laboratory is certified under the Clinical Laboratory Improvement Amendments CLIA as qualified to perform high complexity clinical laboratory testing.    CEFTRIAXONE  Value in next row Sensitive      32 INTERMEDIATEThis is a modified FDA-approved test that has been validated and its performance characteristics determined by the reporting laboratory.  This laboratory is certified under the Clinical Laboratory Improvement Amendments CLIA as qualified to perform high complexity clinical laboratory testing.    CIPROFLOXACIN  Value in next row Resistant      32 INTERMEDIATEThis is a modified FDA-approved test that has been validated and its performance characteristics determined by the reporting laboratory.  This laboratory is certified under the Clinical Laboratory Improvement Amendments CLIA as qualified to perform high complexity clinical laboratory testing.    GENTAMICIN  Value in next row Sensitive      32 INTERMEDIATEThis is a modified FDA-approved test that has been validated and its performance characteristics determined by the reporting laboratory.  This laboratory is certified under the Clinical Laboratory Improvement Amendments CLIA as qualified to perform high complexity clinical laboratory testing.    MEROPENEM Value in next row Sensitive      32  INTERMEDIATEThis is a modified FDA-approved test that has been validated and its performance characteristics determined by the reporting laboratory.  This laboratory is certified under the Clinical Laboratory Improvement Amendments CLIA as qualified to perform high complexity clinical laboratory testing.    TRIMETH /SULFA  Value in next row Resistant      32 INTERMEDIATEThis is a modified FDA-approved test that has been validated and its performance characteristics determined by the reporting laboratory.  This laboratory is certified under the Clinical Laboratory Improvement Amendments CLIA as qualified to perform high complexity clinical laboratory testing.    AMPICILLIN /SULBACTAM Value in next row Sensitive      32 INTERMEDIATEThis is a modified FDA-approved test that has been validated and its performance characteristics determined by the reporting laboratory.  This laboratory is certified under the Clinical Laboratory Improvement Amendments CLIA as qualified to perform high complexity clinical laboratory testing.    PIP/TAZO Value in next row Sensitive      <=4 SENSITIVEThis is a modified FDA-approved test that has been validated and its performance characteristics determined by the reporting laboratory.  This laboratory is certified under the Clinical Laboratory Improvement Amendments CLIA as qualified to perform high complexity clinical laboratory testing.    * FEW ESCHERICHIA  COLI  Aerobic/Anaerobic Culture w Gram Stain (surgical/deep wound)     Status: None (Preliminary result)   Collection Time: 08/09/24 12:09 PM   Specimen: PATH Bone biopsy; Tissue  Result Value Ref Range Status   Specimen Description   Final    TISSUE Performed at [facility], [address], [place], [place] [address]    Special Requests   Final    BONE BOIPSY Performed at [facility], [address] [place], [place] [address]    Gram Stain   Final    RARE WBC PRESENT, PREDOMINANTLY PMN NO  ORGANISMS SEEN Performed at [facility], [address] [place], [place] [address]    Culture   Final    RARE ESCHERICHIA COLI RARE CORYNEBACTERIUM STRIATUM Standardized susceptibility testing for this organism is not available. NO ANAEROBES ISOLATED; CULTURE IN PROGRESS FOR 5 DAYS    Report Status PENDING  Incomplete   Organism ID, Bacteria ESCHERICHIA COLI  Final      Susceptibility   Escherichia coli - MIC*    AMPICILLIN  8 SENSITIVE Sensitive     CEFAZOLIN  (NON-URINE) 2 SENSITIVE Sensitive     CEFEPIME  <=0.12 SENSITIVE Sensitive     ERTAPENEM <=0.12 SENSITIVE Sensitive     CEFTRIAXONE  <=0.25 SENSITIVE Sensitive     CIPROFLOXACIN  >=4 RESISTANT Resistant     GENTAMICIN  <=1 SENSITIVE Sensitive     MEROPENEM <=0.25 SENSITIVE Sensitive     TRIMETH /SULFA  <=20 SENSITIVE Sensitive     AMPICILLIN /SULBACTAM <=2 SENSITIVE Sensitive     PIP/TAZO Value in next row Sensitive      <=4 SENSITIVEThis is a modified FDA-approved test that has been validated and its performance characteristics determined by the reporting laboratory.  This laboratory is certified under the Clinical Laboratory Improvement Amendments CLIA as qualified to perform high complexity clinical laboratory testing.    * RARE ESCHERICHIA COLI      Radiology Studies last 3 days: DG Humerus Left Result Date: 08/10/2024 CLINICAL DATA:  [id number] Fracture, humerus [id number] EXAM: LEFT HUMERUS - 2+ VIEW COMPARISON:  June 09, 2024 FINDINGS: Revisualization of a fracture of the proximal humeral shaft. There is persistent foreshortening and mild angulation. There is persistent displacement of the distal fragment anteriorly and laterally. There has been progressive callus formation. Osteopenia. Similar appearance of a fracture of the humeral head with increased adjacent callus formation along the greater tubercle. IMPRESSION: Healing humeral fractures in similar alignment. Electronically Signed   By: Corean Salter M.D.   On:  08/10/2024 16:51          Dallin Mccorkel, DO [facility] 08/13/2024, 2:40 PM    Dictation software may have been used to generate the above note. Typos may occur and escape review in typed/dictated notes. Please contact Dr Marsa directly for clarity if needed.  Staff may message me via secure chat in Epic  but this may not receive an immediate response,  please page me for urgent matters!  If 7PM-7AM, please contact night coverage [email or web address]

## 2024-08-14 ENCOUNTER — Encounter: Payer: Self-pay | Admitting: Internal Medicine

## 2024-08-14 DIAGNOSIS — B9683 Acinetobacter baumannii as the cause of diseases classified elsewhere: Secondary | ICD-10-CM

## 2024-08-14 DIAGNOSIS — S31000D Unspecified open wound of lower back and pelvis without penetration into retroperitoneum, subsequent encounter: Secondary | ICD-10-CM | POA: Diagnosis not present

## 2024-08-14 DIAGNOSIS — S31819A Unspecified open wound of right buttock, initial encounter: Secondary | ICD-10-CM

## 2024-08-14 DIAGNOSIS — G894 Chronic pain syndrome: Secondary | ICD-10-CM

## 2024-08-14 DIAGNOSIS — B9620 Unspecified Escherichia coli [E. coli] as the cause of diseases classified elsewhere: Secondary | ICD-10-CM

## 2024-08-14 DIAGNOSIS — R7401 Elevation of levels of liver transaminase levels: Secondary | ICD-10-CM

## 2024-08-14 DIAGNOSIS — D649 Anemia, unspecified: Secondary | ICD-10-CM

## 2024-08-14 DIAGNOSIS — L89314 Pressure ulcer of right buttock, stage 4: Secondary | ICD-10-CM

## 2024-08-14 LAB — AEROBIC/ANAEROBIC CULTURE W GRAM STAIN (SURGICAL/DEEP WOUND)

## 2024-08-14 MED ORDER — OXYCODONE HCL 5 MG PO TABS: 2.5000 mg | ORAL_TABLET | Freq: Once | ORAL | Status: DC

## 2024-08-14 MED ORDER — SODIUM CHLORIDE 0.9 % IV SOLN
3.0000 g | Freq: Four times a day (QID) | INTRAVENOUS | Status: DC
  Administered 2024-08-14 – 2024-08-17 (×11): 3 g via INTRAVENOUS
  Filled 2024-08-14 (×12): qty 8

## 2024-08-14 MED ORDER — OXYCODONE HCL 5 MG PO TABS
7.5000 mg | ORAL_TABLET | ORAL | Status: DC | PRN
  Administered 2024-08-14 – 2024-08-17 (×8): 7.5 mg via ORAL
  Filled 2024-08-14 (×8): qty 2

## 2024-08-14 NOTE — Progress Notes (Signed)
 Unable to get wound vac to function properly reads blockage, MD made aware earlier in shift. Pt tearful and states area is extremely painful while attempting to remove wound vac. Pain med given will attempt later in shift.

## 2024-08-14 NOTE — Progress Notes (Signed)
 Pt tearful and states area is extremely painful, yelling out while re-attempting to remove wound vac states don't do it any more I can't take it I got to talk to the doctor. Unable to remove the black foam even with it saturated with saline, saline gauze applied and foam to cover.

## 2024-08-14 NOTE — Consult Note (Signed)
 NAME: Kim Graham  DOB: 06-Jun-1951  MRN: [id number]  Date/Time: 08/14/2024 2:14 PM  REQUESTING PROVIDER: Dr.Alexander Subjective:  REASON FOR CONSULT: sacral decubitus ulcer ? Kim Graham is a 73 y.o. female with a history of treated hep C  diabetes mellitus, COPD, gallstones, cholecystectomy, complicated by biliary leak in February 2023 followed by biliary sphincterotomy and stent placement, history of bilateral renal stones, stent placement, lithotripsy in 2023,History of actinomyces bacteremia in 2023 which was treated with a prolonged course of antibiotics presents from skilled nursing facility for sacral wounds Patient underwent pilonidal sinus surgery On 12/29/2023 and never followed up with the surgeon after that. She then had multiple falls and then fractured her left humerus neck  on 04/20/2024 and was placed on a coaptation splint developed a left wrist drop.  Was admitted to the hospital in July 2025 with another fall and found to have rhabdomyolysis and she was sent to skilled nursing facility on 05/25/2024.  The pilonidal cyst surgical area had not closed completely on the 05/24/2024 admission   Patient went to the surgeon Dr. Marinda' office on 08/08/2024 for right buttock wound and he sent her  to the hospital for admission and debridement of the decubitus..  The previous gluteal cleft pilonidal sinus surgery area was larger as well and had not healed but did not look infected according to the surgeon. Vitals in the ED BP of 97/48, temperature 100.3, pulse 89, sats 94%. Labs revealed a WBC of 8.4, Hb 11.8, platelet 288 and creatinine of 0.91.  ID X-ray of the left humerus showed a healing humeral fracture with persistent displacement of the distal fragment anteriorly and laterally.  On 08/09/2024 she underwent excisional debridement of the right buttock unstageable wound and the sacral decubitus ulcer up to the bone. Cultures were sent.  It grew Acinetobacter and E. coli and I am asked  to see the patient for the same.  She has wound VAC Patient is currently on meropenem.  Pt has no family Says she has many friends Past Medical History:  Diagnosis Date   Acute cholecystitis 12/09/2021   Bile leak    Chronic kidney disease, stage 3a (HCC)    Chronic venous insufficiency    COPD (chronic obstructive pulmonary disease) (HCC)    Depression    Hep C w/o coma, chronic (HCC)    treated   History of kidney stones    Hypertension    Methadone  dependence (HCC)    Nephrolithiasis    Pre-diabetes    Sepsis secondary to UTI (HCC)    Thrombocytopenia     Past Surgical History:  Procedure Laterality Date   CYSTOSCOPY W/ URETERAL STENT PLACEMENT Right 12/09/2021   Procedure: CYSTOSCOPY WITH RETROGRADE PYELOGRAM/URETERAL STENT PLACEMENT;  Surgeon: Twylla Glendia BROCKS, MD;  Location: [facility];  Service: Urology;  Laterality: Right;   CYSTOSCOPY/URETEROSCOPY/HOLMIUM LASER/STENT PLACEMENT Bilateral 02/11/2022   Procedure: CYSTOSCOPY/URETEROSCOPY/HOLMIUM LASER/STENT PLACEMENT & RIGHT URETERAL STENT EXCHANGE;  Surgeon: Twylla Glendia BROCKS, MD;  Location: [facility];  Service: Urology;  Laterality: Bilateral;   CYSTOSCOPY/URETEROSCOPY/HOLMIUM LASER/STENT PLACEMENT Bilateral 05/19/2022   Procedure: CYSTOSCOPY/URETEROSCOPY/HOLMIUM LASER/STENT PLACEMENT;  Surgeon: Twylla Glendia BROCKS, MD;  Location: [facility];  Service: Urology;  Laterality: Bilateral;   ENDOSCOPIC RETROGRADE CHOLANGIOPANCREATOGRAPHY (ERCP) WITH PROPOFOL  N/A 12/11/2021   Procedure: ENDOSCOPIC RETROGRADE CHOLANGIOPANCREATOGRAPHY (ERCP) WITH PROPOFOL ;  Surgeon: Jinny Carmine, MD;  Location: [facility];  Service: Endoscopy;  Laterality: N/A;   ERCP N/A 03/10/2022   Procedure: ENDOSCOPIC RETROGRADE CHOLANGIOPANCREATOGRAPHY (ERCP);  Surgeon: Jinny Carmine, MD;  Location: [facility];  Service: Endoscopy;  Laterality: N/A;  Stent removal   ERCP N/A 06/23/2022   Procedure: ENDOSCOPIC RETROGRADE CHOLANGIOPANCREATOGRAPHY (ERCP);   Surgeon: Jinny Carmine, MD;  Location: [facility];  Service: Endoscopy;  Laterality: N/A;  stent removal   PILONIDAL CYST EXCISION N/A 12/29/2023   Procedure: CYST EXCISION PILONIDAL EXTENSIVE;  Surgeon: Marinda Jayson KIDD, MD;  Location: [facility];  Service: General;  Laterality: N/A;   PLACEMENT OF BREAST IMPLANTS Bilateral    TONSILLECTOMY     WOUND DEBRIDEMENT N/A 08/09/2024   Procedure: Excisional debridement of sacral decubitus ulcer and buttocks ulcer;  Surgeon: Marinda Jayson KIDD, MD;  Location: [facility];  Service: General;  Laterality: N/A;    Social History   Socioeconomic History   Marital status: Single    Spouse name: Not on file   Number of children: Not on file   Years of education: Not on file   Highest education level: Not on file  Occupational History   Not on file  Tobacco Use   Smoking status: Former    Current packs/day: 0.00    Average packs/day: 0.5 packs/day for 49.0 years (24.5 ttl pk-yrs)    Types: Cigarettes    Start date: 12/13/1969    Quit date: 12/13/2018    Years since quitting: 5.6    Passive exposure: Past   Smokeless tobacco: Never  Vaping Use   Vaping status: Never Used  Substance and Sexual Activity   Alcohol use: Not Currently    Comment: On occasion   Drug use: Not Currently    Types: Heroin    Comment: revovering addict   Sexual activity: Not Currently  Other Topics Concern   Not on file  Social History Narrative   Not on file   Social Drivers of Health   Financial Resource Strain: Low Risk  (05/02/2024)   Received from [facility]   Overall Financial Resource Strain (CARDIA)    Difficulty of Paying Living Expenses: Not hard at all  Food Insecurity: No Food Insecurity (08/09/2024)   Hunger Vital Sign    Worried About Running Out of Food in the Last Year: Never true    Ran Out of Food in the Last Year: Never true  Transportation Needs: No Transportation Needs (08/09/2024)   PRAPARE - Scientist, research (physical sciences) (Medical): No    Lack of Transportation (Non-Medical): No  Physical Activity: Insufficiently Active (08/05/2021)   Exercise Vital Sign    Days of Exercise per Week: 4 days    Minutes of Exercise per Session: 20 min  Stress: No Stress Concern Present (08/05/2021)   [employer] of Occupational Health - Occupational Stress Questionnaire    Feeling of Stress : Not at all  Social Connections: Unknown (08/09/2024)   Social Connection and Isolation Panel    Frequency of Communication with Friends and Family: Three times a week    Frequency of Social Gatherings with Friends and Family: Three times a week    Attends Religious Services: Never    Active Member of Clubs or Organizations: No    Attends Banker Meetings: Never    Marital Status: Patient declined  Intimate Partner Violence: Not At Risk (08/09/2024)   Humiliation, Afraid, Rape, and Kick questionnaire    Fear of Current or Ex-Partner: No    Emotionally Abused: No    Physically Abused: No    Sexually Abused: No    Family History  Problem Relation Age of  Onset   Diabetes Mother    Cancer Father        Pancreatic   Diabetes Maternal Aunt    Diabetes Maternal Grandmother    Dementia Maternal Grandfather    Heart disease Maternal Grandfather    Allergies  Allergen Reactions   Doxycycline  Diarrhea    Very bad diarrhea   I? Current Facility-Administered Medications  Medication Dose Route Frequency Provider Last Rate Last Admin   (feeding supplement) PROSource Plus liquid 30 mL  30 mL Oral BID BM Marinda Jayson KIDD, MD   30 mL at 08/14/24 1303   albuterol  (PROVENTIL ) (2.5 MG/3ML) 0.083% nebulizer solution 2.5 mg  2.5 mg Inhalation Q4H PRN Marinda Jayson KIDD, MD       ARIPiprazole  (ABILIFY ) tablet 30 mg  30 mg Oral Daily Marinda Jayson KIDD, MD   30 mg at 08/14/24 9167   ascorbic acid  (VITAMIN C ) tablet 500 mg  500 mg Oral Daily Marinda Jayson KIDD, MD   500 mg at 08/14/24 9166   cyanocobalamin  (VITAMIN B12)  tablet 500 mcg  500 mcg Oral Daily Marinda Jayson KIDD, MD   500 mcg at 08/14/24 0832   dextromethorphan-guaiFENesin (MUCINEX DM) 30-600 MG per 12 hr tablet 1 tablet  1 tablet Oral BID PRN Marinda Jayson KIDD, MD       furosemide  (LASIX ) tablet 20 mg  20 mg Oral Daily Alexander, Natalie, DO   20 mg at 08/14/24 9167   heparin injection 5,000 Units  5,000 Units Subcutaneous Q8H Marinda Jayson KIDD, MD   5,000 Units at 08/14/24 1300   ibuprofen  (ADVIL ) tablet 400 mg  400 mg Oral Q6H PRN Marinda Jayson KIDD, MD   400 mg at 08/11/24 1205   iron  polysaccharides (NIFEREX) capsule 150 mg  150 mg Oral Daily Marinda Jayson KIDD, MD   150 mg at 08/14/24 0833   meropenem (MERREM) 1 g in sodium chloride  0.9 % 100 mL IVPB  1 g Intravenous Q8H Alexander, Natalie, DO 200 mL/hr at 08/14/24 1300 1 g at 08/14/24 1300   methadone  (DOLOPHINE ) 10 MG/ML solution 50 mg  50 mg Oral Q12H Marinda Jayson KIDD, MD   50 mg at 08/14/24 9166   midodrine  (PROAMATINE ) tablet 10 mg  10 mg Oral TID WC Marinda Jayson KIDD, MD   10 mg at 08/14/24 1300   multivitamin with minerals tablet 1 tablet  1 tablet Oral Daily Marinda Jayson KIDD, MD   1 tablet at 08/14/24 9166   ondansetron  (ZOFRAN ) injection 4 mg  4 mg Intravenous Q8H PRN Marinda Jayson KIDD, MD       oxyCODONE  (Oxy IR/ROXICODONE ) immediate release tablet 5 mg  5 mg Oral Q4H PRN Alexander, Natalie, DO   5 mg at 08/14/24 9074   potassium chloride  (KLOR-CON  M) CR tablet 10 mEq  10 mEq Oral BID Alexander, Natalie, DO   10 mEq at 08/14/24 9167     Abtx:  Anti-infectives (From admission, onward)    Start     Dose/Rate Route Frequency Ordered Stop   08/12/24 1400  meropenem (MERREM) 1 g in sodium chloride  0.9 % 100 mL IVPB        1 g 200 mL/hr over 30 Minutes Intravenous Every 8 hours 08/12/24 1313     08/09/24 2000  vancomycin (VANCOREADY) IVPB 750 mg/150 mL  Status:  Discontinued        750 mg 150 mL/hr over 60 Minutes Intravenous Every 24 hours 08/08/24 2003 08/09/24 0736   08/09/24 2000  vancomycin  (  VANCOCIN) IVPB 1000 mg/200 mL premix  Status:  Discontinued        1,000 mg 200 mL/hr over 60 Minutes Intravenous Every 24 hours 08/09/24 0736 08/12/24 1313   08/09/24 0330  piperacillin -tazobactam (ZOSYN ) IVPB 3.375 g  Status:  Discontinued        3.375 g 12.5 mL/hr over 240 Minutes Intravenous Every 8 hours 08/08/24 1955 08/12/24 1313   08/08/24 2000  vancomycin (VANCOREADY) IVPB 1250 mg/250 mL        1,250 mg 166.7 mL/hr over 90 Minutes Intravenous  Once 08/08/24 1954 08/08/24 2235   08/08/24 1915  vancomycin (VANCOCIN) IVPB 1000 mg/200 mL premix  Status:  Discontinued        1,000 mg 200 mL/hr over 60 Minutes Intravenous  Once 08/08/24 1901 08/08/24 1954   08/08/24 1915  piperacillin -tazobactam (ZOSYN ) IVPB 3.375 g        3.375 g 100 mL/hr over 30 Minutes Intravenous  Once 08/08/24 1901 08/08/24 2017       REVIEW OF SYSTEMS:  Const: Low-grade fever, negative chills,  weight loss Eyes: negative diplopia or visual changes, negative eye pain ENT: negative coryza, negative sore throat Resp: negative cough, hemoptysis, dyspnea Cards: negative for chest pain, palpitations, lower extremity edema GU: negative for frequency, dysuria and hematuria GI: Negative for abdominal pain, diarrhea, bleeding, constipation Skin: negative for rash and pruritus Heme: negative for easy bruising and gum/nose bleeding MS: Weakness  Neurolo: Left wrist drop Psych:anxiety, depression  Endocrine: negative for thyroid , diabetes Allergy/Immunology-doxycycline  gave her diarrhea o VITALS:  BP (!) 134/59 (BP Location: Right Arm)   Pulse (!) 58   Temp 98.7 F (37.1 C)   Resp 18   Ht 5' 4.02 (1.626 m)   Wt 84 kg   SpO2 96%   BMI 31.77 kg/m   PHYSICAL EXAM:  General: Alert, cooperative, no distress, appears stated age.  Chronically ill Thin Head: Normocephalic, without obvious abnormality, atraumatic. Eyes: Conjunctivae clear, anicteric sclerae. Pupils are equal ENT Nares normal. No drainage or  sinus tenderness. Lips, mucosa, and tongue normal. No Thrush Poor dentition Neck: Supple, symmetrical, no adenopathy, thyroid : non tender no carotid bruit and no JVD. Back: No CVA tenderness. Lungs: Clear to auscultation bilaterally. No Wheezing or Rhonchi. No rales. Heart: Regular rate and rhythm, no murmur, rub or gallop. Abdomen: Soft, non-tender,not distended. Bowel sounds normal. No masses Extremities: Bilateral heel deep tissue injury Back wound pictures reviewed         Skin: As above Lymph: Cervical, supraclavicular normal. Neurologic: Grossly non-focal Pertinent Labs Lab Results CBC    Component Value Date/Time   WBC 5.8 08/10/2024 0333   RBC 4.08 08/10/2024 0333   HGB 10.5 (L) 08/10/2024 0333   HGB 12.6 04/28/2024 1454   HCT 34.1 (L) 08/10/2024 0333   HCT 36.9 04/28/2024 1454   PLT 225 08/10/2024 0333   PLT 325 04/28/2024 1454   MCV 83.6 08/10/2024 0333   MCV 88 04/28/2024 1454   MCH 25.7 (L) 08/10/2024 0333   MCHC 30.8 08/10/2024 0333   RDW 15.2 08/10/2024 0333   RDW 15.9 (H) 04/28/2024 1454   LYMPHSABS 1.3 08/08/2024 1618   LYMPHSABS 2.5 03/26/2023 1413   MONOABS 0.6 08/08/2024 1618   EOSABS 0.1 08/08/2024 1618   EOSABS 0.1 03/26/2023 1413   BASOSABS 0.0 08/08/2024 1618   BASOSABS 0.1 03/26/2023 1413       Latest Ref Rng & Units 08/13/2024    5:44 AM 08/10/2024    3:33 AM 08/09/2024  5:08 AM  CMP  Glucose 70 - 99 mg/dL  864  896   BUN 8 - 23 mg/dL  12  11   Creatinine 9.55 - 1.00 mg/dL 9.33  9.14  9.34   Sodium 135 - 145 mmol/L  139  141   Potassium 3.5 - 5.1 mmol/L  3.6  3.2   Chloride 98 - 111 mmol/L  102  103   CO2 22 - 32 mmol/L  28  29   Calcium 8.9 - 10.3 mg/dL  8.5  8.4       Microbiology: Recent Results (from the past 240 hours)  Culture, blood (routine x 2)     Status: None   Collection Time: 08/08/24  6:55 PM   Specimen: BLOOD  Result Value Ref Range Status   Specimen Description BLOOD RIGHT ANTECUBITAL  Final   Special  Requests   Final    BOTTLES DRAWN AEROBIC AND ANAEROBIC Blood Culture results may not be optimal due to an inadequate volume of blood received in culture bottles   Culture   Final    NO GROWTH 5 DAYS Performed at [facility], [address] [place], [place] [address]    Report Status 08/13/2024 FINAL  Final  Culture, blood (routine x 2)     Status: None   Collection Time: 08/08/24  7:20 PM   Specimen: BLOOD  Result Value Ref Range Status   Specimen Description BLOOD BLOOD RIGHT HAND  Final   Special Requests   Final    BOTTLES DRAWN AEROBIC AND ANAEROBIC Blood Culture results may not be optimal due to an inadequate volume of blood received in culture bottles   Culture   Final    NO GROWTH 5 DAYS Performed at [facility], [address] [place], [place] [address]    Report Status 08/13/2024 FINAL  Final  Aerobic/Anaerobic Culture w Gram Stain (surgical/deep wound)     Status: None   Collection Time: 08/09/24 12:09 PM   Specimen: Wound; Tissue  Result Value Ref Range Status   Specimen Description   Final    TISSUE BUTTOCKS Performed at [facility], [address] [place], [place] [address]    Special Requests   Final    NONE Performed at [facility], [address] [place], [place] [address]    Gram Stain   Final    WBC PRESENT, PREDOMINANTLY PMN RARE GRAM POSITIVE COCCI RARE GRAM VARIABLE ROD    Culture   Final    FEW ESCHERICHIA COLI FEW ACINETOBACTER CALCOACETICUS/BAUMANNII COMPLEX FEW BACTEROIDES FRAGILIS BETA LACTAMASE POSITIVE Performed at [facility], [address], [place], [place] [address]    Report Status 08/13/2024 FINAL  Final   Organism ID, Bacteria ESCHERICHIA COLI  Final   Organism ID, Bacteria ACINETOBACTER CALCOACETICUS/BAUMANNII COMPLEX  Final      Susceptibility   Acinetobacter calcoaceticus/baumannii complex - MIC*    PIP/TAZO Value in next row Intermediate      32 INTERMEDIATEThis is a modified  FDA-approved test that has been validated and its performance characteristics determined by the reporting laboratory.  This laboratory is certified under the Clinical Laboratory Improvement Amendments CLIA as qualified to perform high complexity clinical laboratory testing.    AMPICILLIN /SULBACTAM Value in next row Sensitive      32 INTERMEDIATEThis is a modified FDA-approved test that has been validated and its performance characteristics determined by the reporting laboratory.  This laboratory is certified under the Clinical Laboratory  Improvement Amendments CLIA as qualified to perform high complexity clinical laboratory testing.    MEROPENEM Value in next row Sensitive      32 INTERMEDIATEThis is a modified FDA-approved test that has been validated and its performance characteristics determined by the reporting laboratory.  This laboratory is certified under the Clinical Laboratory Improvement Amendments CLIA as qualified to perform high complexity clinical laboratory testing.    * FEW ACINETOBACTER CALCOACETICUS/BAUMANNII COMPLEX   Escherichia coli - MIC*    AMPICILLIN  Value in next row Sensitive      32 INTERMEDIATEThis is a modified FDA-approved test that has been validated and its performance characteristics determined by the reporting laboratory.  This laboratory is certified under the Clinical Laboratory Improvement Amendments CLIA as qualified to perform high complexity clinical laboratory testing.    CEFAZOLIN  (NON-URINE) Value in next row Sensitive      32 INTERMEDIATEThis is a modified FDA-approved test that has been validated and its performance characteristics determined by the reporting laboratory.  This laboratory is certified under the Clinical Laboratory Improvement Amendments CLIA as qualified to perform high complexity clinical laboratory testing.    CEFEPIME  Value in next row Sensitive      32 INTERMEDIATEThis is a modified FDA-approved test that has been validated and its  performance characteristics determined by the reporting laboratory.  This laboratory is certified under the Clinical Laboratory Improvement Amendments CLIA as qualified to perform high complexity clinical laboratory testing.    ERTAPENEM Value in next row Sensitive      32 INTERMEDIATEThis is a modified FDA-approved test that has been validated and its performance characteristics determined by the reporting laboratory.  This laboratory is certified under the Clinical Laboratory Improvement Amendments CLIA as qualified to perform high complexity clinical laboratory testing.    CEFTRIAXONE  Value in next row Sensitive      32 INTERMEDIATEThis is a modified FDA-approved test that has been validated and its performance characteristics determined by the reporting laboratory.  This laboratory is certified under the Clinical Laboratory Improvement Amendments CLIA as qualified to perform high complexity clinical laboratory testing.    CIPROFLOXACIN  Value in next row Resistant      32 INTERMEDIATEThis is a modified FDA-approved test that has been validated and its performance characteristics determined by the reporting laboratory.  This laboratory is certified under the Clinical Laboratory Improvement Amendments CLIA as qualified to perform high complexity clinical laboratory testing.    GENTAMICIN  Value in next row Sensitive      32 INTERMEDIATEThis is a modified FDA-approved test that has been validated and its performance characteristics determined by the reporting laboratory.  This laboratory is certified under the Clinical Laboratory Improvement Amendments CLIA as qualified to perform high complexity clinical laboratory testing.    MEROPENEM Value in next row Sensitive      32 INTERMEDIATEThis is a modified FDA-approved test that has been validated and its performance characteristics determined by the reporting laboratory.  This laboratory is certified under the Clinical Laboratory Improvement Amendments CLIA  as qualified to perform high complexity clinical laboratory testing.    TRIMETH /SULFA  Value in next row Resistant      32 INTERMEDIATEThis is a modified FDA-approved test that has been validated and its performance characteristics determined by the reporting laboratory.  This laboratory is certified under the Clinical Laboratory Improvement Amendments CLIA as qualified to perform high complexity clinical laboratory testing.    AMPICILLIN /SULBACTAM Value in next row Sensitive      32 INTERMEDIATEThis is a modified FDA-approved test  that has been validated and its performance characteristics determined by the reporting laboratory.  This laboratory is certified under the Clinical Laboratory Improvement Amendments CLIA as qualified to perform high complexity clinical laboratory testing.    PIP/TAZO Value in next row Sensitive      <=4 SENSITIVEThis is a modified FDA-approved test that has been validated and its performance characteristics determined by the reporting laboratory.  This laboratory is certified under the Clinical Laboratory Improvement Amendments CLIA as qualified to perform high complexity clinical laboratory testing.    * FEW ESCHERICHIA COLI  Aerobic/Anaerobic Culture w Gram Stain (surgical/deep wound)     Status: None   Collection Time: 08/09/24 12:09 PM   Specimen: PATH Bone biopsy; Tissue  Result Value Ref Range Status   Specimen Description   Final    TISSUE Performed at [facility], [address] [place], [place] [address]    Special Requests   Final    BONE BOIPSY Performed at [facility], [address] [place], [place] [address]    Gram Stain   Final    RARE WBC PRESENT, PREDOMINANTLY PMN NO ORGANISMS SEEN    Culture   Final    RARE ESCHERICHIA COLI RARE CORYNEBACTERIUM STRIATUM Standardized susceptibility testing for this organism is not available. NO ANAEROBES ISOLATED Performed at [facility], [address] [place], [place]  [address]    Report Status 08/14/2024 FINAL  Final   Organism ID, Bacteria ESCHERICHIA COLI  Final      Susceptibility   Escherichia coli - MIC*    AMPICILLIN  8 SENSITIVE Sensitive     CEFAZOLIN  (NON-URINE) 2 SENSITIVE Sensitive     CEFEPIME  <=0.12 SENSITIVE Sensitive     ERTAPENEM <=0.12 SENSITIVE Sensitive     CEFTRIAXONE  <=0.25 SENSITIVE Sensitive     CIPROFLOXACIN  >=4 RESISTANT Resistant     GENTAMICIN  <=1 SENSITIVE Sensitive     MEROPENEM <=0.25 SENSITIVE Sensitive     TRIMETH /SULFA  <=20 SENSITIVE Sensitive     AMPICILLIN /SULBACTAM <=2 SENSITIVE Sensitive     PIP/TAZO Value in next row Sensitive      <=4 SENSITIVEThis is a modified FDA-approved test that has been validated and its performance characteristics determined by the reporting laboratory.  This laboratory is certified under the Clinical Laboratory Improvement Amendments CLIA as qualified to perform high complexity clinical laboratory testing.    * RARE ESCHERICHIA COLI      IMAGING RESULTS: No imaging for the back  I have personally reviewed the films ?X-ray of the left arm shows healing humerus fracture in malalignment Displacement of the distal fragment anteriorly and laterally.  Progressive callus formation   Impression/Recommendation Right buttock decubitus Was unstageable Status post debridement and is a stage IV now Acinetobacter and E. coli from the culture  Pilonidal sinus excision in February 2025 The wound has not healed and now it is a deep  wound at the sacrum That has also been debrided.  Wound culture is E. coli  Patient is going to need 4 to 6 weeks of IV antibiotics There is no p.o. option to cover both organisms We can do Unasyn  3 g IV every 6.  There is no oral option available Also the other option would be meropenem but we do not want to do a broad-spectrum antibiotic for this.  Anemia Hypokalemia is resolved Mild transaminitis  Left humerus fracture And malalignment Managed by  brace Has a left wrist drop   Chronic pain syndrome on methadone   COPD  Patient will need PICC line Will put OPAT orders ? This consult involve complex antimicrobial management   I have personally spent  -75--minutes involved in face-to-face and non-face-to-face activities for this patient on the day of the visit. Professional time spent includes the following activities: Preparing to see the patient (review of tests), Obtaining and/or reviewing separately obtained history (admission/discharge record), Performing a medically appropriate examination and/or evaluation , Ordering medications/tests/procedures, referring and communicating with other health care professionals, Documenting clinical information in the EMR, Independently interpreting results (not separately reported), Communicating results to the patient/ Counseling and educating the patient/f and Care coordination (not separately reported).    ________________________________________________ Discussed with requesting provider Note:  This document was prepared using Dragon voice recognition software and may include unintentional dictation errors.

## 2024-08-14 NOTE — Progress Notes (Signed)
 OT Cancellation Note  Patient Details Name: Kim Graham MRN: [id number] DOB: 06/24/1951   Cancelled Treatment:    Reason Eval/Treat Not Completed: Patient declined, no reason specified;Pain limiting ability to participate. Pt sleeping but wakes to voice. Declines OT, expressing desire to remain as is despite 8/10 pain. Declines assist for repositioning. RN notified of pain per pt request. Will re-attempt at later date/time.   Koryn Charlot R., MPH, MS, OTR/L ascom [phone] 08/14/24, 1:57 PM

## 2024-08-14 NOTE — Progress Notes (Signed)
 Notified WOC nurse about wound vac replacement

## 2024-08-14 NOTE — Consult Note (Signed)
 WOC Nurse wound follow up Wound type: PI stage 4. Measurement: Wounds connected. Total 13 cm x 8 cm x 4 cm. Wound bed: 80% red, 20% yellow/brown slough on R gluteal. Drainage (amount, consistency, odor) 100 ml in canister at 0900. Vac machine was alarming that night, blockage signs. The foam was remained. Periwound: intact. Dressing procedure/placement/frequency: Removed old NPWT dressing Cleansed wound with normal saline Periwound skin protected with skin barrier wipe. Half ring used on the base of the sacrum wound edge.   Filled wound with 2 piece of black foam  Sealed NPWT dressing at HG Patient received IV pain medication per bedside nurse prior to dressing change Patient complained of excruciating pain, but she accept use the vac dressing again.  WOC nurse will continue to provide NPWT dressing changed due to the complexity of the dressing change.    WOC team will follow THURS. Please reconsult if further assistance is needed. Thank-you,  Lela Holm RN, CNS, [employer], MSN.  (Phone [phone])

## 2024-08-14 NOTE — Progress Notes (Signed)
 PT Cancellation Note  Patient Details Name: Kim Graham MRN: [id number] DOB: 06/27/1951   Cancelled Treatment:    Reason Eval/Treat Not Completed: Other (comment) Pt received in bed, declining participation in therapy, reporting she had a morning already with trying to change wound vac. Will f/u as able.  Richerd Pinal, PT, DPT 08/14/24, 1:34 PM    [name] 08/14/2024, 1:34 PM

## 2024-08-14 NOTE — Progress Notes (Signed)
 PROGRESS NOTE    Kim Graham   FMW:[id number] DOB: [birth year]  DOA: 08/08/2024 Date of Service: 08/14/24 which is hospital day 6  PCP: Vicci Duwaine SQUIBB, DO    Hospital course / significant events:   HPI: Kim Graham is a 73 y.o. female with medical history significant of chronic pain on methadone , COPD, diet-controled type 2 diabetes, history of HCV status posttreatment, dCHF, depression, CKD-3A, bile leakage, chronic venous insufficiency, kidney stone, chronic hypotension on midodrine , who presents with sacral wound infection. Pt has hx of pilonidal cyst and underwent extensive resection in the past, with a chronic sacral wound in right buttock area. Pt is following up with Dr. Marinda of surgery. Pt states that she has worsening pain in the right buttock wound recently.  The pain is constant, aching, severe, nonradiating, aggravated by movement.  Pt was seen by Dr. Marinda today who recommended hospital admission and operative debridement.   09/30: admitted to hospitalist service w/ surgical consult, plan for OR tomorrow for debridement. IV abx  10/01: to OR today. Excisional debridement of sacral decubitus ulcer and right buttock wound measuring 11 cm x 11 cm x 6 cm with debridement through skin, subcutaneous tissue, muscle, fascia and bone. Cultures and biopsies R buttock tissue and sacral bone pending.  10/02: Tissue cultures and biopsies taken in OR - pending final reports but thus far tissue (+)rare GPC and rare gram variable rod, bone no organisms seen. Surgery team planning possible wound vac placement, continue monitor for now and await culture / pathology 10/03: BCx NGx3d, bone bx/cx and wound cx reincubated for better growth. Wound vac placed today, per surgery plan for monitor thru the weekend and plan discharge Mon 10/04: (+)acinetobacter and Ecoli, switching to meropenem abx  10/05: stable thru weekend 10/06: wound vac change today. Per general surgery d/w Dr Jordis pt ok from  their standpoint for discharge to follow outpatient. D/w ID pharmacy, no good po option for abx given culture results, will ask ID for formal consult      Consultants:  General surgery  Procedures/Surgeries: 08/09/24 Excisional debridement of sacral decubitus ulcer and buttocks ulcer w/ Dr Marinda      ASSESSMENT & PLAN:   Sacral wound with infection, not meeting sepsis criteria S/p debridement sacral/buttock wound  Methadone  dependence due to chronic pain (+)acinetobacter and Ecoli Blood cultures x 2 NG x3d Vancomycin and zosyn  --> meropenem 08/12/24   D/w ID pharmacy, no good po option for abx given culture results, will ask ID for formal consult  Wound Care: Wound vac placed 08/11/24 and exchanged 08/14/24  Continue pressure offloading, frequent repositioning, low air loss mattress  continue home methadone  for chronic pain + added as needed oxycodone  and ibuprofen  for pain - caution given hypotension and opiates    Chronic hypotension:  midodrine  10 mg 3 times daily   Hypokalemia Replace as needed Monitor BMP  Chronic diastolic CHF (congestive heart failure) not in exacerbation:  2D echo 07/29/2020 showed EF 60 to 65% with grade 1 diastolic dysfunction.  Patient does not have SOB, only has trace leg edema, CHF seems to be compensated. Cautious restart home lasix  and doing ok  BNP - minimal elevation into 200s I&O, daily weights   Chronic kidney disease, stage 3a:   GFR> 60 today Follow-up with BMP   Chronic obstructive pulmonary disease : Stable bronchodilators and prn Mucinex   Iron  deficiency anemia: Hemoglobin stable 11.8 Continue iron  supplement   Hep C w/o coma, chronic S/p treatment.  AST 56, ALT 58, total bilirubin 0.6  Avoid using Tylenol  / other hepatotoxins    Depression, recurrent Current continue home medications, Abilify       No concerns based on BMI: Body mass index is 19.55 kg/m.[name] Significantly low or high BMI is associated with higher  medical risk.  Underweight - under 18  overweight - 25 to 29 obese - 30 or more Class 1 obesity: BMI of 30.0 to 34 Class 2 obesity: BMI of 35.0 to 39 Class 3 obesity: BMI of 40.0 to 49 Super Morbid Obesity: BMI 50-59 Super-super Morbid Obesity: BMI 60+ Healthy nutrition and physical activity advised as adjunct to other disease management and risk reduction treatments    DVT prophylaxis: heparin IV fluids: none Nutrition: resuem diet postop heart healthy Central lines / other devices: none  Code Status: FULL CODE ACP documentation reviewed:  none on file in [facility]  [facility] needs: SNF Medical barriers to dispo: IV meropenem. Expected medical readiness for discharge pending final ID abx recs but medically stable otherwise              Subjective / Brief ROS:  Pt resting comfortably as I enter the room, watching TV. I inquire about any complains/concerns, she begins crying and states pain on her bottom is really bad and requests dilaudid . No other problems, denies CP/SOB, tolerating diet   Family Communication: none at this time     Objective Findings:  Vitals:   08/13/24 1928 08/14/24 0324 08/14/24 0339 08/14/24 0748  BP: (!) 118/55  127/64 (!) 134/59  Pulse: (!) 59  (!) 58 (!) 58  Resp: 18  18 18   Temp: 98.8 F (37.1 C)  98 F (36.7 C) 98.7 F (37.1 C)  TempSrc:      SpO2: 97%  95% 96%  Weight:  84 kg    Height:        Intake/Output Summary (Last 24 hours) at 08/14/2024 1310 Last data filed at 08/14/2024 0800 Gross per 24 hour  Intake 6.59 ml  Output 175 ml  Net -168.41 ml   Filed Weights   08/12/24 0441 08/13/24 0447 08/14/24 0324  Weight: 91 kg 89 kg 84 kg    Examination:  Physical Exam Constitutional:      General: She is not in acute distress. Cardiovascular:     Rate and Rhythm: Normal rate.  Pulmonary:     Effort: Pulmonary effort is normal.  Neurological:     Mental Status: She is alert.          Scheduled Medications:   (feeding  supplement) PROSource Plus  30 mL Oral BID BM   ARIPiprazole   30 mg Oral Daily   ascorbic acid   500 mg Oral Daily   cyanocobalamin   500 mcg Oral Daily   furosemide   20 mg Oral Daily   heparin  5,000 Units Subcutaneous Q8H   iron  polysaccharides  150 mg Oral Daily   methadone   50 mg Oral Q12H   midodrine   10 mg Oral TID WC   multivitamin with minerals  1 tablet Oral Daily   potassium chloride   10 mEq Oral BID    Continuous Infusions:  meropenem (MERREM) IV 1 g (08/14/24 1300)    PRN Medications:  albuterol , dextromethorphan-guaiFENesin, ibuprofen , ondansetron  (ZOFRAN ) IV, oxyCODONE   Antimicrobials from admission:  Anti-infectives (From admission, onward)    Start     Dose/Rate Route Frequency Ordered Stop   08/12/24 1400  meropenem (MERREM) 1 g in sodium chloride  0.9 % 100 mL  IVPB        1 g 200 mL/hr over 30 Minutes Intravenous Every 8 hours 08/12/24 1313     08/09/24 2000  vancomycin (VANCOREADY) IVPB 750 mg/150 mL  Status:  Discontinued        750 mg 150 mL/hr over 60 Minutes Intravenous Every 24 hours 08/08/24 2003 08/09/24 0736   08/09/24 2000  vancomycin (VANCOCIN) IVPB 1000 mg/200 mL premix  Status:  Discontinued        1,000 mg 200 mL/hr over 60 Minutes Intravenous Every 24 hours 08/09/24 0736 08/12/24 1313   08/09/24 0330  piperacillin -tazobactam (ZOSYN ) IVPB 3.375 g  Status:  Discontinued        3.375 g 12.5 mL/hr over 240 Minutes Intravenous Every 8 hours 08/08/24 1955 08/12/24 1313   08/08/24 2000  vancomycin (VANCOREADY) IVPB 1250 mg/250 mL        1,250 mg 166.7 mL/hr over 90 Minutes Intravenous  Once 08/08/24 1954 08/08/24 2235   08/08/24 1915  vancomycin (VANCOCIN) IVPB 1000 mg/200 mL premix  Status:  Discontinued        1,000 mg 200 mL/hr over 60 Minutes Intravenous  Once 08/08/24 1901 08/08/24 1954   08/08/24 1915  piperacillin -tazobactam (ZOSYN ) IVPB 3.375 g        3.375 g 100 mL/hr over 30 Minutes Intravenous  Once 08/08/24 1901 08/08/24 2017            Data Reviewed:  I have personally reviewed the following...  CBC: Recent Labs  Lab 08/08/24 1618 08/09/24 0508 08/10/24 0333  WBC 8.4 6.0 5.8  NEUTROABS 6.3  --   --   HGB 11.8* 10.3* 10.5*  HCT 37.9 32.9* 34.1*  MCV 83.7 83.5 83.6  PLT 288 225 225   Basic Metabolic Panel: Recent Labs  Lab 08/08/24 1618 08/09/24 0508 08/10/24 0333 08/13/24 0544  NA 140 141 139  --   K 2.7* 3.2* 3.6  --   CL 96* 103 102  --   CO2 29 29 28   --   GLUCOSE 134* 103* 135*  --   BUN 13 11 12   --   CREATININE 0.91 0.65 0.85 0.66  CALCIUM 8.9 8.4* 8.5*  --   MG 2.1  --   --   --   PHOS 3.1  --   --   --    GFR: Estimated Creatinine Clearance: 66.6 mL/min (by C-G formula based on SCr of 0.66 mg/dL). Liver Function Tests: Recent Labs  Lab 08/08/24 1618  AST 56*  ALT 58*  ALKPHOS 99  BILITOT 0.6  PROT 6.9  ALBUMIN 3.0*   No results for input(s): LIPASE, AMYLASE in the last 168 hours. No results for input(s): AMMONIA in the last 168 hours. Coagulation Profile: Recent Labs  Lab 08/09/24 0508  INR 1.2   Cardiac Enzymes: No results for input(s): CKTOTAL, CKMB, CKMBINDEX, TROPONINI in the last 168 hours. BNP (last 3 results) No results for input(s): PROBNP in the last 8760 hours. HbA1C: No results for input(s): HGBA1C in the last 72 hours. CBG: Recent Labs  Lab 08/10/24 0741 08/11/24 0750 08/11/24 1141 08/11/24 2102 08/12/24 0730  GLUCAP 78 95 104* 138* 92   Lipid Profile: No results for input(s): CHOL, HDL, LDLCALC, TRIG, CHOLHDL, LDLDIRECT in the last 72 hours. Thyroid  Function Tests: No results for input(s): TSH, T4TOTAL, FREET4, T3FREE, THYROIDAB in the last 72 hours. Anemia Panel: No results for input(s): VITAMINB12, FOLATE, FERRITIN, TIBC, IRON , RETICCTPCT in the last 72 hours. Most Recent  Urinalysis On File:     Component Value Date/Time   COLORURINE AMBER (A) 05/17/2024 1934   APPEARANCEUR CLEAR  (A) 05/17/2024 1934   APPEARANCEUR Cloudy (A) 04/16/2023 1424   LABSPEC 1.019 05/17/2024 1934   PHURINE 6.0 05/17/2024 1934   GLUCOSEU NEGATIVE 05/17/2024 1934   HGBUR LARGE (A) 05/17/2024 1934   BILIRUBINUR NEGATIVE 05/17/2024 1934   BILIRUBINUR Negative 04/16/2023 1424   KETONESUR 5 (A) 05/17/2024 1934   PROTEINUR 100 (A) 05/17/2024 1934   NITRITE NEGATIVE 05/17/2024 1934   LEUKOCYTESUR TRACE (A) 05/17/2024 1934   Sepsis Labs: @LABRCNTIP (procalcitonin:4,lacticidven:4) Microbiology: Recent Results (from the past 240 hours)  Culture, blood (routine x 2)     Status: None   Collection Time: 08/08/24  6:55 PM   Specimen: BLOOD  Result Value Ref Range Status   Specimen Description BLOOD RIGHT ANTECUBITAL  Final   Special Requests   Final    BOTTLES DRAWN AEROBIC AND ANAEROBIC Blood Culture results may not be optimal due to an inadequate volume of blood received in culture bottles   Culture   Final    NO GROWTH 5 DAYS Performed at [facility], [address] [place], [place] [address]    Report Status 08/13/2024 FINAL  Final  Culture, blood (routine x 2)     Status: None   Collection Time: 08/08/24  7:20 PM   Specimen: BLOOD  Result Value Ref Range Status   Specimen Description BLOOD BLOOD RIGHT HAND  Final   Special Requests   Final    BOTTLES DRAWN AEROBIC AND ANAEROBIC Blood Culture results may not be optimal due to an inadequate volume of blood received in culture bottles   Culture   Final    NO GROWTH 5 DAYS Performed at [facility], [address], [place], [place] [address]    Report Status 08/13/2024 FINAL  Final  Aerobic/Anaerobic Culture w Gram Stain (surgical/deep wound)     Status: None   Collection Time: 08/09/24 12:09 PM   Specimen: Wound; Tissue  Result Value Ref Range Status   Specimen Description   Final    TISSUE BUTTOCKS Performed at [facility], [address], [place], [place] [address]    Special Requests   Final     NONE Performed at [facility], [address] [place], [place] [address]    Gram Stain   Final    WBC PRESENT, PREDOMINANTLY PMN RARE GRAM POSITIVE COCCI RARE GRAM VARIABLE ROD    Culture   Final    FEW ESCHERICHIA COLI FEW ACINETOBACTER CALCOACETICUS/BAUMANNII COMPLEX FEW BACTEROIDES FRAGILIS BETA LACTAMASE POSITIVE Performed at [facility], [address] [place], [place] [address]    Report Status 08/13/2024 FINAL  Final   Organism ID, Bacteria ESCHERICHIA COLI  Final   Organism ID, Bacteria ACINETOBACTER CALCOACETICUS/BAUMANNII COMPLEX  Final      Susceptibility   Acinetobacter calcoaceticus/baumannii complex - MIC*    PIP/TAZO Value in next row Intermediate      32 INTERMEDIATEThis is a modified FDA-approved test that has been validated and its performance characteristics determined by the reporting laboratory.  This laboratory is certified under the Clinical Laboratory Improvement Amendments CLIA as qualified to perform high complexity clinical laboratory testing.    AMPICILLIN /SULBACTAM Value in next row Sensitive      32 INTERMEDIATEThis is a modified FDA-approved test that has been validated and its performance characteristics determined by the reporting laboratory.  This laboratory is certified under the  Clinical Laboratory Improvement Amendments CLIA as qualified to perform high complexity clinical laboratory testing.    MEROPENEM Value in next row Sensitive      32 INTERMEDIATEThis is a modified FDA-approved test that has been validated and its performance characteristics determined by the reporting laboratory.  This laboratory is certified under the Clinical Laboratory Improvement Amendments CLIA as qualified to perform high complexity clinical laboratory testing.    * FEW ACINETOBACTER CALCOACETICUS/BAUMANNII COMPLEX   Escherichia coli - MIC*    AMPICILLIN  Value in next row Sensitive      32 INTERMEDIATEThis is a modified FDA-approved test that has been  validated and its performance characteristics determined by the reporting laboratory.  This laboratory is certified under the Clinical Laboratory Improvement Amendments CLIA as qualified to perform high complexity clinical laboratory testing.    CEFAZOLIN  (NON-URINE) Value in next row Sensitive      32 INTERMEDIATEThis is a modified FDA-approved test that has been validated and its performance characteristics determined by the reporting laboratory.  This laboratory is certified under the Clinical Laboratory Improvement Amendments CLIA as qualified to perform high complexity clinical laboratory testing.    CEFEPIME  Value in next row Sensitive      32 INTERMEDIATEThis is a modified FDA-approved test that has been validated and its performance characteristics determined by the reporting laboratory.  This laboratory is certified under the Clinical Laboratory Improvement Amendments CLIA as qualified to perform high complexity clinical laboratory testing.    ERTAPENEM Value in next row Sensitive      32 INTERMEDIATEThis is a modified FDA-approved test that has been validated and its performance characteristics determined by the reporting laboratory.  This laboratory is certified under the Clinical Laboratory Improvement Amendments CLIA as qualified to perform high complexity clinical laboratory testing.    CEFTRIAXONE  Value in next row Sensitive      32 INTERMEDIATEThis is a modified FDA-approved test that has been validated and its performance characteristics determined by the reporting laboratory.  This laboratory is certified under the Clinical Laboratory Improvement Amendments CLIA as qualified to perform high complexity clinical laboratory testing.    CIPROFLOXACIN  Value in next row Resistant      32 INTERMEDIATEThis is a modified FDA-approved test that has been validated and its performance characteristics determined by the reporting laboratory.  This laboratory is certified under the Clinical Laboratory  Improvement Amendments CLIA as qualified to perform high complexity clinical laboratory testing.    GENTAMICIN  Value in next row Sensitive      32 INTERMEDIATEThis is a modified FDA-approved test that has been validated and its performance characteristics determined by the reporting laboratory.  This laboratory is certified under the Clinical Laboratory Improvement Amendments CLIA as qualified to perform high complexity clinical laboratory testing.    MEROPENEM Value in next row Sensitive      32 INTERMEDIATEThis is a modified FDA-approved test that has been validated and its performance characteristics determined by the reporting laboratory.  This laboratory is certified under the Clinical Laboratory Improvement Amendments CLIA as qualified to perform high complexity clinical laboratory testing.    TRIMETH /SULFA  Value in next row Resistant      32 INTERMEDIATEThis is a modified FDA-approved test that has been validated and its performance characteristics determined by the reporting laboratory.  This laboratory is certified under the Clinical Laboratory Improvement Amendments CLIA as qualified to perform high complexity clinical laboratory testing.    AMPICILLIN /SULBACTAM Value in next row Sensitive      32 INTERMEDIATEThis is a modified  FDA-approved test that has been validated and its performance characteristics determined by the reporting laboratory.  This laboratory is certified under the Clinical Laboratory Improvement Amendments CLIA as qualified to perform high complexity clinical laboratory testing.    PIP/TAZO Value in next row Sensitive      <=4 SENSITIVEThis is a modified FDA-approved test that has been validated and its performance characteristics determined by the reporting laboratory.  This laboratory is certified under the Clinical Laboratory Improvement Amendments CLIA as qualified to perform high complexity clinical laboratory testing.    * FEW ESCHERICHIA COLI  Aerobic/Anaerobic Culture  w Gram Stain (surgical/deep wound)     Status: None   Collection Time: 08/09/24 12:09 PM   Specimen: PATH Bone biopsy; Tissue  Result Value Ref Range Status   Specimen Description   Final    TISSUE Performed at [facility], [address], [place], [place] [address]    Special Requests   Final    BONE BOIPSY Performed at [facility], [address] [place], [place] [address]    Gram Stain   Final    RARE WBC PRESENT, PREDOMINANTLY PMN NO ORGANISMS SEEN    Culture   Final    RARE ESCHERICHIA COLI RARE CORYNEBACTERIUM STRIATUM Standardized susceptibility testing for this organism is not available. NO ANAEROBES ISOLATED Performed at [facility], [address] [place], [place] [address]    Report Status 08/14/2024 FINAL  Final   Organism ID, Bacteria ESCHERICHIA COLI  Final      Susceptibility   Escherichia coli - MIC*    AMPICILLIN  8 SENSITIVE Sensitive     CEFAZOLIN  (NON-URINE) 2 SENSITIVE Sensitive     CEFEPIME  <=0.12 SENSITIVE Sensitive     ERTAPENEM <=0.12 SENSITIVE Sensitive     CEFTRIAXONE  <=0.25 SENSITIVE Sensitive     CIPROFLOXACIN  >=4 RESISTANT Resistant     GENTAMICIN  <=1 SENSITIVE Sensitive     MEROPENEM <=0.25 SENSITIVE Sensitive     TRIMETH /SULFA  <=20 SENSITIVE Sensitive     AMPICILLIN /SULBACTAM <=2 SENSITIVE Sensitive     PIP/TAZO Value in next row Sensitive      <=4 SENSITIVEThis is a modified FDA-approved test that has been validated and its performance characteristics determined by the reporting laboratory.  This laboratory is certified under the Clinical Laboratory Improvement Amendments CLIA as qualified to perform high complexity clinical laboratory testing.    * RARE ESCHERICHIA COLI      Radiology Studies last 3 days: DG Humerus Left Result Date: 08/10/2024 CLINICAL DATA:  [id number] Fracture, humerus [id number] EXAM: LEFT HUMERUS - 2+ VIEW COMPARISON:  June 09, 2024 FINDINGS: Revisualization of a fracture of the proximal humeral  shaft. There is persistent foreshortening and mild angulation. There is persistent displacement of the distal fragment anteriorly and laterally. There has been progressive callus formation. Osteopenia. Similar appearance of a fracture of the humeral head with increased adjacent callus formation along the greater tubercle. IMPRESSION: Healing humeral fractures in similar alignment. Electronically Signed   By: Corean Salter M.D.   On: 08/10/2024 16:51          Sharline Lehane, DO [facility] 08/14/2024, 1:10 PM    Dictation software may have been used to generate the above note. Typos may occur and escape review in typed/dictated notes. Please contact Dr Marsa directly for clarity if needed.  Staff may message me via secure chat in Epic  but this may not receive an immediate response,  please page me for urgent matters!  If  7PM-7AM, please contact night coverage [email or web address]

## 2024-08-15 DIAGNOSIS — S31000D Unspecified open wound of lower back and pelvis without penetration into retroperitoneum, subsequent encounter: Secondary | ICD-10-CM | POA: Diagnosis not present

## 2024-08-15 LAB — BASIC METABOLIC PANEL WITH GFR
Anion gap: 9 (ref 5–15)
BUN: 10 mg/dL (ref 8–23)
CO2: 32 mmol/L (ref 22–32)
Calcium: 9.2 mg/dL (ref 8.9–10.3)
Chloride: 102 mmol/L (ref 98–111)
Creatinine, Ser: 0.69 mg/dL (ref 0.44–1.00)
GFR, Estimated: >60 mL/min (ref >60)
Glucose, Bld: 102 mg/dL — ABNORMAL HIGH (ref 70–99)
Potassium: 2.9 mmol/L — ABNORMAL LOW (ref 3.5–5.1)
Sodium: 143 mmol/L (ref 135–145)

## 2024-08-15 LAB — CBC
HCT: 39.1 % (ref 36.0–46.0)
Hemoglobin: 12.1 g/dL (ref 12.0–15.0)
MCH: 25.9 pg — ABNORMAL LOW (ref 26.0–34.0)
MCHC: 30.9 g/dL (ref 30.0–36.0)
MCV: 83.5 fL (ref 80.0–100.0)
Platelets: 268 K/uL (ref 150–400)
RBC: 4.68 MIL/uL (ref 3.87–5.11)
RDW: 16.3 % — ABNORMAL HIGH (ref 11.5–15.5)
WBC: 7.4 K/uL (ref 4.0–10.5)
nRBC: 0 % (ref 0.0–0.2)

## 2024-08-15 MED ORDER — POTASSIUM CHLORIDE CRYS ER 20 MEQ PO TBCR
40.0000 meq | EXTENDED_RELEASE_TABLET | Freq: Two times a day (BID) | ORAL | Status: AC
  Administered 2024-08-15 (×2): 40 meq via ORAL
  Filled 2024-08-15 (×2): qty 2

## 2024-08-15 NOTE — Progress Notes (Signed)
 Occupational Therapy Treatment Patient Details Name: Kim Graham MRN: [id number] DOB: 1951-06-09 Today's Date: 08/15/2024   History of present illness Pt is a 73 y/o F admitted on 08/08/24 after presenting with worsening pain in chronic sacral wound. Pt is s/p debridement on 08/09/24. PMH: chronic pain on methadone , COPD, diet-controlled DM2, HCV, dCHF, depression, CKD 3A, bile leakage, chronic venous insufficiency, kidney stone, chronic hypotension on midodrine    OT comments  Pt seen for OT tx, overlapping with PT for ADL/mobility efforts. Pt endorsing a 9/10 sacral pain throughout session. Premedicated prior to therapy. Gentle AAROM for LUE performed with pt doing at least 50% of the work, wrist brace removed and gentle stretching provided to 3-5th fingers to increase ROM as fingers were very tight. Rolled washcloth placed in hand to improve more functional neutral hand positioning, pt educated in gentle Colin ROM for L hand. Pt verbalized understanding. Pt engaged in bed mobility and standing with +2 (see below for details), and tolerated standing for ~56min EOB with PT, requiring MAX A for pericare in standing after BM. Pt tolerated session well. Repositioned in L sidelying for pressure relief and comfort with pillows positioned to minimize pressure at joints. Pt continues to benefit from skilled OT services.       If plan is discharge home, recommend the following:  A lot of help with walking and/or transfers;A lot of help with bathing/dressing/bathroom   Equipment Recommendations  Other (comment) (defer)    Recommendations for Other Services      Precautions / Restrictions Precautions Precautions: Fall Recall of Precautions/Restrictions: Intact Precaution/Restrictions Comments: wound vac placement lumbosacral region Required Braces or Orthoses: Other Brace Other Brace: LUE wrist brace Restrictions Weight Bearing Restrictions Per Provider Order: Yes LUE Weight Bearing Per Provider  Order: Non weight bearing Other Position/Activity Restrictions: per secure chat with Dr Lorelle, pt to begin gentle WBing       Mobility Bed Mobility Overal bed mobility: Needs Assistance Bed Mobility: Rolling, Sidelying to Sit, Sit to Supine Rolling: Max assist, Used rails Sidelying to sit: Max assist   Sit to supine: Min assist   General bed mobility comments: +2 for boosting up in bed    Transfers Overall transfer level: Needs assistance Equipment used: 2 person hand held assist Transfers: Sit to/from Stand Sit to Stand: +2 safety/equipment, Min assist, From elevated surface                 Balance Overall balance assessment: Needs assistance Sitting-balance support: Feet supported Sitting balance-Leahy Scale: Fair   Postural control: Posterior lean Standing balance support: Bilateral upper extremity supported, Reliant on assistive device for balance, During functional activity Standing balance-Leahy Scale: Poor                             ADL either performed or assessed with clinical judgement   ADL Overall ADL's : Needs assistance/impaired                 Upper Body Dressing : Bed level;Moderate assistance Upper Body Dressing Details (indicate cue type and reason): gown change Lower Body Dressing: Maximal assistance;Bed level       Toileting- Clothing Manipulation and Hygiene: Sit to/from stand;Maximal assistance;+2 for safety/equipment Toileting - Clothing Manipulation Details (indicate cue type and reason): MAX A for pericare after BM in standing, PT assisting with standing balance            Extremity/Trunk Assessment  Vision       Perception     Praxis     Communication Communication Communication: No apparent difficulties   Cognition Arousal: Alert Behavior During Therapy: WFL for tasks assessed/performed Cognition: No family/caregiver present to determine baseline             OT - Cognition  Comments: slow processing                 Following commands: Impaired Following commands impaired: Follows multi-step commands with increased time      Cueing   Cueing Techniques: Verbal cues  Exercises Other Exercises Other Exercises: Gentle AAROM for LUE performed with pt doing at least 50% of the work, wrist brace removed and gentle stretching provided to 3-5th fingers to increase ROM as fingers were very tight. Rolled washcloth placed in hand to improve more functional neutral hand positioning, pt educated in gentle [place] ROM for L hand    Shoulder Instructions       General Comments      Pertinent Vitals/ Pain       Pain Assessment Pain Assessment: 0-10 Pain Score: 9  Pain Location: sacral wound Pain Descriptors / Indicators: Grimacing, Guarding, Discomfort Pain Intervention(s): Limited activity within patient's tolerance, Monitored during session, Premedicated before session, Repositioned  Home Living                                          Prior Functioning/Environment              Frequency  Min 2X/week        Progress Toward Goals  OT Goals(current goals can now be found in the care plan section)  Progress towards OT goals: Progressing toward goals  Acute Rehab OT Goals Patient Stated Goal: rehab Time For Goal Achievement: 08/24/24 Potential to Achieve Goals: Good  Plan      Co-evaluation    PT/OT/SLP Co-Evaluation/Treatment: Yes Reason for Co-Treatment: For patient/therapist safety;To address functional/ADL transfers PT goals addressed during session: Mobility/safety with mobility;Balance OT goals addressed during session: ADL's and [place]-care      AM-PAC OT 6 Clicks Daily Activity     Outcome Measure   Help from another person eating meals?: None Help from another person taking care of personal grooming?: A Little Help from another person toileting, which includes using toliet, bedpan, or urinal?: A Lot Help  from another person bathing (including washing, rinsing, drying)?: A Lot Help from another person to put on and taking off regular upper body clothing?: A Little Help from another person to put on and taking off regular lower body clothing?: A Lot 6 Click Score: 16    End of Session    OT Visit Diagnosis: Other abnormalities of gait and mobility (R26.89)   Activity Tolerance Patient tolerated treatment well   Patient Left in bed;with call bell/phone within reach;with bed alarm set   Nurse Communication          Time: 8993-8941 OT Time Calculation (min): 52 min  Charges: OT General Charges $OT Visit: 1 Visit OT Treatments $Therapeutic Exercise: 23-37 mins  Warren SAUNDERS., MPH, MS, OTR/L ascom [phone] 08/15/24, 1:22 PM

## 2024-08-15 NOTE — Progress Notes (Signed)
 Physical Therapy Treatment Patient Details Name: Kim Graham MRN: [id number] DOB: [birth year] Today's Date: 08/15/2024   History of Present Illness Pt is a 73 y/o F admitted on 08/08/24 after presenting with worsening pain in chronic sacral wound. Pt is s/p debridement on 08/09/24. PMH: chronic pain on methadone , COPD, diet-controlled DM2, HCV, dCHF, depression, CKD 3A, bile leakage, chronic venous insufficiency, kidney stone, chronic hypotension on midodrine     PT Comments  Patient seen in co-treat with OT for optimal progression.  Able to complete transition to edge of bed, mod/max assist, maintaining static sitting with close sup to min assist (with fatigue). Progresses to sit/stand and static standing balance with R UE HHA, mod assist +1-2 for safety; heavy posterior weight shift/lean and significant anxiety/fear of falling.  Requires constant cuing/assist for balance, but demonstrating progressive increase in participation and overall activity tolerance this date.     If plan is discharge home, recommend the following: A lot of help with walking and/or transfers;A lot of help with bathing/dressing/bathroom;Assist for transportation;Assistance with cooking/housework;A little help with bathing/dressing/bathroom;Help with stairs or ramp for entrance   Can travel by private vehicle     No  Equipment Recommendations       Recommendations for Other Services       Precautions / Restrictions Precautions Precautions: Fall Recall of Precautions/Restrictions: Intact Precaution/Restrictions Comments: wound vac placement lumbosacral region Required Braces or Orthoses: Other Brace Other Brace: LUE wrist brace, L shoulder brace Restrictions Weight Bearing Restrictions Per Provider Order: Yes LUE Weight Bearing Per Provider Order: Non weight bearing     Mobility  Bed Mobility Overal bed mobility: Needs Assistance Bed Mobility: Supine to Sit Rolling: Mod assist, Max assist   Supine to  sit: Max assist Sit to supine: Mod assist        Transfers Overall transfer level: Needs assistance   Transfers: Sit to/from Stand Sit to Stand: +2 safety/equipment, Min assist, From elevated surface, Mod assist                Ambulation/Gait               General Gait Details: deferred   Stairs             Wheelchair Mobility     Tilt Bed    Modified Rankin (Stroke Patients Only)       Balance Overall balance assessment: Needs assistance Sitting-balance support: No upper extremity supported, Feet supported Sitting balance-Leahy Scale: Fair     Standing balance support: Single extremity supported, During functional activity Standing balance-Leahy Scale: Poor Standing balance comment: significant posterior lean with standing; constant manual/verbal cuing for weight shifting                            Communication Communication Communication: No apparent difficulties  Cognition Arousal: Alert Behavior During Therapy: WFL for tasks assessed/performed   PT - Cognitive impairments: No family/caregiver present to determine baseline                         Following commands: Impaired Following commands impaired: Follows multi-step commands with increased time    Cueing Cueing Techniques: Verbal cues  Exercises Other Exercises Other Exercises: Static standing balance x5 minutes, min progressing to mod assist with fatigue, for peri-care, linen change.  Progressive posterior trunk lean/weight shift in standing, minimal/no attempts at spontaenous correction.  Very fearful of falling; constant manual assist to correct/maintain  balance Other Exercises: Positioned in L sidelying for pressure-relief/offloading to buttocks; tolerating well    General Comments        Pertinent Vitals/Pain Pain Assessment Pain Assessment: 0-10 Pain Score: 9  Pain Location: sacral wound Pain Descriptors / Indicators: Grimacing, Guarding,  Discomfort Pain Intervention(s): Limited activity within patient's tolerance, Monitored during session, Repositioned, Premedicated before session    Home Living                          Prior Function            PT Goals (current goals can now be found in the care plan section) Acute Rehab PT Goals Patient Stated Goal: get better PT Goal Formulation: With patient Time For Goal Achievement: 08/24/24 Potential to Achieve Goals: Fair Progress towards PT goals: Progressing toward goals    Frequency    Min 2X/week      PT Plan      Co-evaluation   Reason for Co-Treatment: For patient/therapist safety;To address functional/ADL transfers PT goals addressed during session: Mobility/safety with mobility;Balance OT goals addressed during session: ADL's and [place]-care      AM-PAC PT 6 Clicks Mobility   Outcome Measure  Help needed turning from your back to your side while in a flat bed without using bedrails?: A Little Help needed moving from lying on your back to sitting on the side of a flat bed without using bedrails?: A Lot Help needed moving to and from a bed to a chair (including a wheelchair)?: A Lot Help needed standing up from a chair using your arms (e.g., wheelchair or bedside chair)?: A Lot Help needed to walk in hospital room?: A Lot Help needed climbing 3-5 steps with a railing? : Total 6 Click Score: 12    End of Session   Activity Tolerance: Patient tolerated treatment well Patient left: in bed;with call bell/phone within reach Nurse Communication: Mobility status PT Visit Diagnosis: Muscle weakness (generalized) (M62.81);Other abnormalities of gait and mobility (R26.89);Unsteadiness on feet (R26.81);Difficulty in walking, not elsewhere classified (R26.2)     Time: 8962-8941 PT Time Calculation (min) (ACUTE ONLY): 21 min  Charges:    $Therapeutic Activity: 8-22 mins PT General Charges $$ ACUTE PT VISIT: 1 Visit                      Kim Graham, PT, DPT, NCS 08/15/24, 4:53 PM [phone]

## 2024-08-15 NOTE — Plan of Care (Signed)
  Problem: Education: Goal: Knowledge of General Education information will improve Description: Including pain rating scale, medication(s)/side effects and non-pharmacologic comfort measures Outcome: Progressing   Problem: Health Behavior/Discharge Planning: Goal: Ability to manage health-related needs will improve Outcome: Progressing   Problem: Clinical Measurements: Goal: Will remain free from infection Outcome: Progressing Goal: Cardiovascular complication will be avoided Outcome: Progressing   Problem: Elimination: Goal: Will not experience complications related to urinary retention Outcome: Progressing

## 2024-08-15 NOTE — Progress Notes (Signed)
 Date of Admission:  08/08/2024     ID: Kim Graham is a 73 y.o. female  Principal Problem:   Sacral wound with infection Active Problems:   Chronic obstructive pulmonary disease (HCC)   Depression, recurrent   Hep C w/o coma, chronic (HCC)   Methadone  dependence (HCC)   Chronic kidney disease, stage 3a (HCC)   Hypokalemia   Chronic diastolic CHF (congestive heart failure) (HCC)   Iron  deficiency anemia   Chronic hypotension   Wound of right buttock    Subjective: Pt has  no specific complaints  Medications:   (feeding supplement) PROSource Plus  30 mL Oral BID BM   ARIPiprazole   30 mg Oral Daily   ascorbic acid   500 mg Oral Daily   cyanocobalamin   500 mcg Oral Daily   furosemide   20 mg Oral Daily   heparin  5,000 Units Subcutaneous Q8H   iron  polysaccharides  150 mg Oral Daily   methadone   50 mg Oral Q12H   midodrine   10 mg Oral TID WC   multivitamin with minerals  1 tablet Oral Daily   oxyCODONE   2.5 mg Oral Once   potassium chloride   40 mEq Oral BID    Objective: Vital signs in last 24 hours: Patient Vitals for the past 24 hrs:  BP Temp Temp src Pulse Resp SpO2 Weight  08/15/24 0739 (!) 132/54 98.6 F (37 C) -- (!) 53 18 98 % --  08/15/24 0341 (!) 130/53 98.1 F (36.7 C) Oral (!) 56 18 95 % 87 kg  08/14/24 2312 (!) 117/56 -- -- -- -- -- --  08/14/24 1918 (!) 126/54 98.8 F (37.1 C) -- (!) 58 16 100 % --  08/14/24 1626 135/61 98.6 F (37 C) Oral 68 18 94 % --       PHYSICAL EXAM:  General: Alert, cooperative, no distress, appears stated age. thin Lungs: Clear to auscultation bilaterally. No Wheezing or Rhonchi. No rales. Heart: Regular rate and rhythm, no murmur, rub or gallop. Abdomen: Soft, non-tender,not distended. Bowel sounds normal. No masses Extremities: left arm brace- wrist drop Skin: No rashes or lesions. Or bruising Lymph: Cervical, supraclavicular normal. Neurologic: left wrist dropl  Lab Results    Latest Ref Rng & Units 08/15/2024     3:31 AM 08/10/2024    3:33 AM 08/09/2024    5:08 AM  CBC  WBC 4.0 - 10.5 K/uL 7.4  5.8  6.0   Hemoglobin 12.0 - 15.0 g/dL 87.8  89.4  89.6   Hematocrit 36.0 - 46.0 % 39.1  34.1  32.9   Platelets 150 - 400 K/uL 268  225  225        Latest Ref Rng & Units 08/15/2024    3:31 AM 08/13/2024    5:44 AM 08/10/2024    3:33 AM  CMP  Glucose 70 - 99 mg/dL 897   864   BUN 8 - 23 mg/dL 10   12   Creatinine 9.55 - 1.00 mg/dL 9.30  9.33  9.14   Sodium 135 - 145 mmol/L 143   139   Potassium 3.5 - 5.1 mmol/L 2.9   3.6   Chloride 98 - 111 mmol/L 102   102   CO2 22 - 32 mmol/L 32   28   Calcium 8.9 - 10.3 mg/dL 9.2   8.5       Microbiology: WC- acinetobacter and ecoli    Assessment/Plan: Right buttock decubitus Was unstageable Status post debridement and is  a stage IV now Acinetobacter and E. coli from the culture   Pilonidal sinus excision in February 2025 The wound has not healed and now it is a deep  wound at the sacrum That has also been debrided.  Wound culture is E. coli   Patient is going to need 4 to 6 weeks of IV antibiotics There is no p.o. option to cover both organisms We can do Unasyn  3 g IV every 6.  There is no oral option available Also the other option would be meropenem but we do not want to do a broad-spectrum antibiotic for this.   Anemia Hypokalemia resolved Mild transaminitis   Left humerus fracture And malalignment Managed by brace Has a left wrist drop     Chronic pain syndrome on methadone    COPD   Patient will need PICC line  [facility] note entered

## 2024-08-15 NOTE — Progress Notes (Signed)
 PROGRESS NOTE    Kim Graham   FMW:[id number] DOB: [birth year]  DOA: 08/08/2024 Date of Service: 08/15/24 which is hospital day 7  PCP: Vicci Duwaine SQUIB[facility], [facility] course / significant events:   HPI: Kim Graham is a 73 y.o. female with medical history significant of chronic pain on methadone , COPD, diet-controled type 2 diabetes, history of HCV status posttreatment, dCHF, depression, CKD-3A, bile leakage, chronic venous insufficiency, kidney stone, chronic hypotension on midodrine , who presents with sacral wound infection. Pt has hx of pilonidal cyst and underwent extensive resection in the past, with a chronic sacral wound in right buttock area. Pt is following up with Dr. Marinda of surgery. Pt states that she has worsening pain in the right buttock wound recently.  The pain is constant, aching, severe, nonradiating, aggravated by movement.  Pt was seen by Dr. Marinda today who recommended hospital admission and operative debridement.   09/30: admitted to hospitalist service w/ surgical consult, plan for OR tomorrow for debridement. IV abx  10/01: to OR today. Excisional debridement of sacral decubitus ulcer and right buttock wound measuring 11 cm x 11 cm x 6 cm with debridement through skin, subcutaneous tissue, muscle, fascia and bone. Cultures and biopsies R buttock tissue and sacral bone pending.  10/02: Tissue cultures and biopsies taken in OR - pending final reports but thus far tissue (+)rare GPC and rare gram variable rod, bone no organisms seen. Surgery team planning possible wound vac placement, continue monitor for now and await culture / pathology 10/03: BCx NGx3d, bone bx/cx and wound cx reincubated for better growth. Wound vac placed today, per surgery plan for monitor thru the weekend and plan discharge Mon 10/04: (+)acinetobacter and Ecoli, switching to meropenem abx  10/05: stable thru weekend 10/06: wound vac change today. Per general surgery d/w Dr Jordis pt ok from  their standpoint for discharge to follow outpatient. D/w ID pharmacy, no good po option for abx given culture results, per ID going to need 4 to 6 weeks of IV antibiotics w/ Unasyn  3 g IV q6h, no oral option available  10/07: pending SNF rehab placement. Ortho following up on referral     Consultants:  General surgery Infectious disease   Procedures/Surgeries: 08/09/24 Excisional debridement of sacral decubitus ulcer and buttocks ulcer w/ Dr Marinda      ASSESSMENT & PLAN:   Sacral wound with infection, not meeting sepsis criteria S/p debridement sacral/buttock wound  Methadone  dependence due to chronic pain (+)acinetobacter and Ecoli Blood cultures x 2 NG x3d Vancomycin and zosyn  --> meropenem 10/04 --> Unasyn  10/06 per ID going to need 4 to 6 weeks of IV antibiotics w/ Unasyn  3 g IV q6h, no oral option available  Will need PICC prior to discharge  Wound Care: Wound vac placed 08/11/24 and exchanged 08/14/24  Continue pressure offloading, frequent repositioning, low air loss mattress  Pain control (note pt is on chronic methadone )    Chronic hypotension:  midodrine  10 mg 3 times daily   Hypokalemia Replace as needed Monitor BMP  Chronic diastolic CHF (congestive heart failure) not in exacerbation:  2D echo 07/29/2020 showed EF 60 to 65% with grade 1 diastolic dysfunction.  Patient does not have SOB, only has trace leg edema, CHF seems to be compensated. Cautious restart home lasix  and doing ok  BNP - minimal elevation into 200s I&O, daily weights   Chronic kidney disease, stage 3a:   GFR> 60 today Follow-up with BMP   Chronic obstructive  pulmonary disease : Stable bronchodilators and prn Mucinex   Iron  deficiency anemia: Hemoglobin stable 11.8 Continue iron  supplement   Hep C w/o coma, chronic S/p treatment.  AST 56, ALT 58, total bilirubin 0.6  Avoid using Tylenol  / other hepatotoxins    Depression, recurrent Current continue home medications, Abilify     [facility] following up on their referral to trauma ortho Please check w/ Dr Lorelle prior ti pt discharge to confirm ortho follow up plan    No concerns based on BMI: Body mass index is 19.55 kg/m.[name] Significantly low or high BMI is associated with higher medical risk.  Underweight - under 18  overweight - 25 to 29 obese - 30 or more Class 1 obesity: BMI of 30.0 to 34 Class 2 obesity: BMI of 35.0 to 39 Class 3 obesity: BMI of 40.0 to 49 Super Morbid Obesity: BMI 50-59 Super-super Morbid Obesity: BMI 60+ Healthy nutrition and physical activity advised as adjunct to other disease management and risk reduction treatments    DVT prophylaxis: heparin IV fluids: none Nutrition: resuem diet postop heart healthy Central lines / other devices: none  Code Status: FULL CODE ACP documentation reviewed:  none on file in [facility]  TOC needs: SNF Medical barriers to dispo: none, will need PICC and iv abx orders upon discharge              Subjective / Brief ROS:  Pt resting comfortably as I enter the room, watching TV. I inquire about any complains/concerns, she begins crying and states pain on her bottom is really bad and requests dilaudid . Same as yesterday. No other problems, denies CP/SOB, tolerating diet   Family Communication: none at this time     Objective Findings:  Vitals:   08/14/24 1918 08/14/24 2312 08/15/24 0341 08/15/24 0739  BP: (!) 126/54 (!) 117/56 (!) 130/53 (!) 132/54  Pulse: (!) 58  (!) 56 (!) 53  Resp: 16  18 18   Temp: 98.8 F (37.1 C)  98.1 F (36.7 C) 98.6 F (37 C)  TempSrc:   Oral   SpO2: 100%  95% 98%  Weight:   87 kg   Height:        Intake/Output Summary (Last 24 hours) at 08/15/2024 1505 Last data filed at 08/15/2024 1408 Gross per 24 hour  Intake 340 ml  Output 500 ml  Net -160 ml   Filed Weights   08/13/24 0447 08/14/24 0324 08/15/24 0341  Weight: 89 kg 84 kg 87 kg    Examination:  Physical Exam Constitutional:       General: She is not in acute distress. Cardiovascular:     Rate and Rhythm: Normal rate and regular rhythm.  Pulmonary:     Effort: Pulmonary effort is normal.     Breath sounds: Normal breath sounds.  Musculoskeletal:     Right lower leg: No edema.     Left lower leg: No edema.     Comments: Brace on L wrist and immobilizer L shoulder   Neurological:     Mental Status: She is alert and oriented to person, place, and time.          Scheduled Medications:   (feeding supplement) PROSource Plus  30 mL Oral BID BM   ARIPiprazole   30 mg Oral Daily   ascorbic acid   500 mg Oral Daily   cyanocobalamin   500 mcg Oral Daily   furosemide   20 mg Oral Daily   heparin  5,000 Units Subcutaneous Q8H  iron  polysaccharides  150 mg Oral Daily   methadone   50 mg Oral Q12H   midodrine   10 mg Oral TID WC   multivitamin with minerals  1 tablet Oral Daily   oxyCODONE   2.5 mg Oral Once   potassium chloride   40 mEq Oral BID    Continuous Infusions:  ampicillin -sulbactam (UNASYN ) IV 3 g (08/15/24 1254)    PRN Medications:  albuterol , dextromethorphan-guaiFENesin, ibuprofen , ondansetron  (ZOFRAN ) IV, oxyCODONE   Antimicrobials from admission:  Anti-infectives (From admission, onward)    Start     Dose/Rate Route Frequency Ordered Stop   08/14/24 2200  Ampicillin -Sulbactam (UNASYN ) 3 g in sodium chloride  0.9 % 100 mL IVPB        3 g 200 mL/hr over 30 Minutes Intravenous Every 6 hours 08/14/24 1612     08/12/24 1400  meropenem (MERREM) 1 g in sodium chloride  0.9 % 100 mL IVPB  Status:  Discontinued        1 g 200 mL/hr over 30 Minutes Intravenous Every 8 hours 08/12/24 1313 08/14/24 1612   08/09/24 2000  vancomycin (VANCOREADY) IVPB 750 mg/150 mL  Status:  Discontinued        750 mg 150 mL/hr over 60 Minutes Intravenous Every 24 hours 08/08/24 2003 08/09/24 0736   08/09/24 2000  vancomycin (VANCOCIN) IVPB 1000 mg/200 mL premix  Status:  Discontinued        1,000 mg 200 mL/hr over 60 Minutes  Intravenous Every 24 hours 08/09/24 0736 08/12/24 1313   08/09/24 0330  piperacillin -tazobactam (ZOSYN ) IVPB 3.375 g  Status:  Discontinued        3.375 g 12.5 mL/hr over 240 Minutes Intravenous Every 8 hours 08/08/24 1955 08/12/24 1313   08/08/24 2000  vancomycin (VANCOREADY) IVPB 1250 mg/250 mL        1,250 mg 166.7 mL/hr over 90 Minutes Intravenous  Once 08/08/24 1954 08/08/24 2235   08/08/24 1915  vancomycin (VANCOCIN) IVPB 1000 mg/200 mL premix  Status:  Discontinued        1,000 mg 200 mL/hr over 60 Minutes Intravenous  Once 08/08/24 1901 08/08/24 1954   08/08/24 1915  piperacillin -tazobactam (ZOSYN ) IVPB 3.375 g        3.375 g 100 mL/hr over 30 Minutes Intravenous  Once 08/08/24 1901 08/08/24 2017           Data Reviewed:  I have personally reviewed the following...  CBC: Recent Labs  Lab 08/08/24 1618 08/09/24 0508 08/10/24 0333 08/15/24 0331  WBC 8.4 6.0 5.8 7.4  NEUTROABS 6.3  --   --   --   HGB 11.8* 10.3* 10.5* 12.1  HCT 37.9 32.9* 34.1* 39.1  MCV 83.7 83.5 83.6 83.5  PLT 288 225 225 268   Basic Metabolic Panel: Recent Labs  Lab 08/08/24 1618 08/09/24 0508 08/10/24 0333 08/13/24 0544 08/15/24 0331  NA 140 141 139  --  143  K 2.7* 3.2* 3.6  --  2.9*  CL 96* 103 102  --  102  CO2 29 29 28   --  32  GLUCOSE 134* 103* 135*  --  102*  BUN 13 11 12   --  10  CREATININE 0.91 0.65 0.85 0.66 0.69  CALCIUM 8.9 8.4* 8.5*  --  9.2  MG 2.1  --   --   --   --   PHOS 3.1  --   --   --   --    GFR: Estimated Creatinine Clearance: 67.8 mL/min (by C-G formula based on  SCr of 0.69 mg/dL). Liver Function Tests: Recent Labs  Lab 08/08/24 1618  AST 56*  ALT 58*  ALKPHOS 99  BILITOT 0.6  PROT 6.9  ALBUMIN 3.0*   No results for input(s): LIPASE, AMYLASE in the last 168 hours. No results for input(s): AMMONIA in the last 168 hours. Coagulation Profile: Recent Labs  Lab 08/09/24 0508  INR 1.2   Cardiac Enzymes: No results for input(s): CKTOTAL,  CKMB, CKMBINDEX, TROPONINI in the last 168 hours. BNP (last 3 results) No results for input(s): PROBNP in the last 8760 hours. HbA1C: No results for input(s): HGBA1C in the last 72 hours. CBG: Recent Labs  Lab 08/10/24 0741 08/11/24 0750 08/11/24 1141 08/11/24 2102 08/12/24 0730  GLUCAP 78 95 104* 138* 92   Lipid Profile: No results for input(s): CHOL, HDL, LDLCALC, TRIG, CHOLHDL, LDLDIRECT in the last 72 hours. Thyroid  Function Tests: No results for input(s): TSH, T4TOTAL, FREET4, T3FREE, THYROIDAB in the last 72 hours. Anemia Panel: No results for input(s): VITAMINB12, FOLATE, FERRITIN, TIBC, IRON , RETICCTPCT in the last 72 hours. Most Recent Urinalysis On File:     Component Value Date/Time   COLORURINE AMBER (A) 05/17/2024 1934   APPEARANCEUR CLEAR (A) 05/17/2024 1934   APPEARANCEUR Cloudy (A) 04/16/2023 1424   LABSPEC 1.019 05/17/2024 1934   PHURINE 6.0 05/17/2024 1934   GLUCOSEU NEGATIVE 05/17/2024 1934   HGBUR LARGE (A) 05/17/2024 1934   BILIRUBINUR NEGATIVE 05/17/2024 1934   BILIRUBINUR Negative 04/16/2023 1424   KETONESUR 5 (A) 05/17/2024 1934   PROTEINUR 100 (A) 05/17/2024 1934   NITRITE NEGATIVE 05/17/2024 1934   LEUKOCYTESUR TRACE (A) 05/17/2024 1934   Sepsis Labs: @LABRCNTIP (procalcitonin:4,lacticidven:4) Microbiology: Recent Results (from the past 240 hours)  Culture, blood (routine x 2)     Status: None   Collection Time: 08/08/24  6:55 PM   Specimen: BLOOD  Result Value Ref Range Status   Specimen Description BLOOD RIGHT ANTECUBITAL  Final   Special Requests   Final    BOTTLES DRAWN AEROBIC AND ANAEROBIC Blood Culture results may not be optimal due to an inadequate volume of blood received in culture bottles   Culture   Final    NO GROWTH 5 DAYS Performed at [facility], [address] [place], [place] [address]    Report Status 08/13/2024 FINAL  Final  Culture, blood (routine x 2)      Status: None   Collection Time: 08/08/24  7:20 PM   Specimen: BLOOD  Result Value Ref Range Status   Specimen Description BLOOD BLOOD RIGHT HAND  Final   Special Requests   Final    BOTTLES DRAWN AEROBIC AND ANAEROBIC Blood Culture results may not be optimal due to an inadequate volume of blood received in culture bottles   Culture   Final    NO GROWTH 5 DAYS Performed at [facility], [address], [place], [place] [address]    Report Status 08/13/2024 FINAL  Final  Aerobic/Anaerobic Culture w Gram Stain (surgical/deep wound)     Status: None   Collection Time: 08/09/24 12:09 PM   Specimen: Wound; Tissue  Result Value Ref Range Status   Specimen Description   Final    TISSUE BUTTOCKS Performed at [facility], [address], [place], [place] [address]    Special Requests   Final    NONE Performed at [facility], [address] [place], [place] [address]    Gram Stain   Final    WBC  PRESENT, PREDOMINANTLY PMN RARE GRAM POSITIVE COCCI RARE GRAM VARIABLE ROD    Culture   Final    FEW ESCHERICHIA COLI FEW ACINETOBACTER CALCOACETICUS/BAUMANNII COMPLEX FEW BACTEROIDES FRAGILIS BETA LACTAMASE POSITIVE Performed at [facility], [address], [place], [place] [address]    Report Status 08/13/2024 FINAL  Final   Organism ID, Bacteria ESCHERICHIA COLI  Final   Organism ID, Bacteria ACINETOBACTER CALCOACETICUS/BAUMANNII COMPLEX  Final      Susceptibility   Acinetobacter calcoaceticus/baumannii complex - MIC*    PIP/TAZO Value in next row Intermediate      32 INTERMEDIATEThis is a modified FDA-approved test that has been validated and its performance characteristics determined by the reporting laboratory.  This laboratory is certified under the Clinical Laboratory Improvement Amendments CLIA as qualified to perform high complexity clinical laboratory testing.    AMPICILLIN /SULBACTAM Value in next row Sensitive      32 INTERMEDIATEThis is a  modified FDA-approved test that has been validated and its performance characteristics determined by the reporting laboratory.  This laboratory is certified under the Clinical Laboratory Improvement Amendments CLIA as qualified to perform high complexity clinical laboratory testing.    MEROPENEM Value in next row Sensitive      32 INTERMEDIATEThis is a modified FDA-approved test that has been validated and its performance characteristics determined by the reporting laboratory.  This laboratory is certified under the Clinical Laboratory Improvement Amendments CLIA as qualified to perform high complexity clinical laboratory testing.    * FEW ACINETOBACTER CALCOACETICUS/BAUMANNII COMPLEX   Escherichia coli - MIC*    AMPICILLIN  Value in next row Sensitive      32 INTERMEDIATEThis is a modified FDA-approved test that has been validated and its performance characteristics determined by the reporting laboratory.  This laboratory is certified under the Clinical Laboratory Improvement Amendments CLIA as qualified to perform high complexity clinical laboratory testing.    CEFAZOLIN  (NON-URINE) Value in next row Sensitive      32 INTERMEDIATEThis is a modified FDA-approved test that has been validated and its performance characteristics determined by the reporting laboratory.  This laboratory is certified under the Clinical Laboratory Improvement Amendments CLIA as qualified to perform high complexity clinical laboratory testing.    CEFEPIME  Value in next row Sensitive      32 INTERMEDIATEThis is a modified FDA-approved test that has been validated and its performance characteristics determined by the reporting laboratory.  This laboratory is certified under the Clinical Laboratory Improvement Amendments CLIA as qualified to perform high complexity clinical laboratory testing.    ERTAPENEM Value in next row Sensitive      32 INTERMEDIATEThis is a modified FDA-approved test that has been validated and its  performance characteristics determined by the reporting laboratory.  This laboratory is certified under the Clinical Laboratory Improvement Amendments CLIA as qualified to perform high complexity clinical laboratory testing.    CEFTRIAXONE  Value in next row Sensitive      32 INTERMEDIATEThis is a modified FDA-approved test that has been validated and its performance characteristics determined by the reporting laboratory.  This laboratory is certified under the Clinical Laboratory Improvement Amendments CLIA as qualified to perform high complexity clinical laboratory testing.    CIPROFLOXACIN  Value in next row Resistant      32 INTERMEDIATEThis is a modified FDA-approved test that has been validated and its performance characteristics determined by the reporting laboratory.  This laboratory is certified under the Clinical Laboratory Improvement Amendments CLIA as qualified to perform high complexity clinical laboratory testing.  GENTAMICIN  Value in next row Sensitive      32 INTERMEDIATEThis is a modified FDA-approved test that has been validated and its performance characteristics determined by the reporting laboratory.  This laboratory is certified under the Clinical Laboratory Improvement Amendments CLIA as qualified to perform high complexity clinical laboratory testing.    MEROPENEM Value in next row Sensitive      32 INTERMEDIATEThis is a modified FDA-approved test that has been validated and its performance characteristics determined by the reporting laboratory.  This laboratory is certified under the Clinical Laboratory Improvement Amendments CLIA as qualified to perform high complexity clinical laboratory testing.    TRIMETH /SULFA  Value in next row Resistant      32 INTERMEDIATEThis is a modified FDA-approved test that has been validated and its performance characteristics determined by the reporting laboratory.  This laboratory is certified under the Clinical Laboratory Improvement Amendments  CLIA as qualified to perform high complexity clinical laboratory testing.    AMPICILLIN /SULBACTAM Value in next row Sensitive      32 INTERMEDIATEThis is a modified FDA-approved test that has been validated and its performance characteristics determined by the reporting laboratory.  This laboratory is certified under the Clinical Laboratory Improvement Amendments CLIA as qualified to perform high complexity clinical laboratory testing.    PIP/TAZO Value in next row Sensitive      <=4 SENSITIVEThis is a modified FDA-approved test that has been validated and its performance characteristics determined by the reporting laboratory.  This laboratory is certified under the Clinical Laboratory Improvement Amendments CLIA as qualified to perform high complexity clinical laboratory testing.    * FEW ESCHERICHIA COLI  Aerobic/Anaerobic Culture w Gram Stain (surgical/deep wound)     Status: None   Collection Time: 08/09/24 12:09 PM   Specimen: PATH Bone biopsy; Tissue  Result Value Ref Range Status   Specimen Description   Final    TISSUE Performed at [facility], [address], [place], [place] [address]    Special Requests   Final    BONE BOIPSY Performed at [facility], [address] [place], [place] [address]    Gram Stain   Final    RARE WBC PRESENT, PREDOMINANTLY PMN NO ORGANISMS SEEN    Culture   Final    RARE ESCHERICHIA COLI RARE CORYNEBACTERIUM STRIATUM Standardized susceptibility testing for this organism is not available. NO ANAEROBES ISOLATED Performed at [facility], [address] [place], [place] [address]    Report Status 08/14/2024 FINAL  Final   Organism ID, Bacteria ESCHERICHIA COLI  Final      Susceptibility   Escherichia coli - MIC*    AMPICILLIN  8 SENSITIVE Sensitive     CEFAZOLIN  (NON-URINE) 2 SENSITIVE Sensitive     CEFEPIME  <=0.12 SENSITIVE Sensitive     ERTAPENEM <=0.12 SENSITIVE Sensitive     CEFTRIAXONE  <=0.25 SENSITIVE Sensitive      CIPROFLOXACIN  >=4 RESISTANT Resistant     GENTAMICIN  <=1 SENSITIVE Sensitive     MEROPENEM <=0.25 SENSITIVE Sensitive     TRIMETH /SULFA  <=20 SENSITIVE Sensitive     AMPICILLIN /SULBACTAM <=2 SENSITIVE Sensitive     PIP/TAZO Value in next row Sensitive      <=4 SENSITIVEThis is a modified FDA-approved test that has been validated and its performance characteristics determined by the reporting laboratory.  This laboratory is certified under the Clinical Laboratory Improvement Amendments CLIA as qualified to perform high complexity clinical laboratory testing.    * RARE ESCHERICHIA COLI  Radiology Studies last 3 days: No results found.         Shiloh Swopes, DO [facility] 08/15/2024, 3:05 PM    Dictation software may have been used to generate the above note. Typos may occur and escape review in typed/dictated notes. Please contact Dr Marsa directly for clarity if needed.  Staff may message me via secure chat in Epic  but this may not receive an immediate response,  please page me for urgent matters!  If 7PM-7AM, please contact night coverage [email or web address]

## 2024-08-15 NOTE — Treatment Plan (Signed)
 Diagnosis: Rt buttock decubitus and sacral decubitus Baseline Creatinine <1  Culture Result: Acinetobacter and ecoli  Allergies  Allergen Reactions   Doxycycline  Diarrhea    Very bad diarrhea    OPAT Orders Discharge antibiotics: Unasyn  3 grams IV every 6 hours Duration: 4 weeks End Date:09/07/24   [facility] Care Per Protocol:  Labs weekly while on IV antibiotics: _X_ CBC with differential  _X_ CMP _X_ CRP _X_ ESR   _X_ Please pull PIC at completion of IV antibiotics   Fax weekly lab results  promptly to [phone]  Clinic Follow Up Appt:10/30 /25 at 9.15 Am   Call [phone] with critical values or questions

## 2024-08-16 ENCOUNTER — Other Ambulatory Visit: Payer: Self-pay

## 2024-08-16 DIAGNOSIS — S31819D Unspecified open wound of right buttock, subsequent encounter: Secondary | ICD-10-CM

## 2024-08-16 DIAGNOSIS — S31000D Unspecified open wound of lower back and pelvis without penetration into retroperitoneum, subsequent encounter: Secondary | ICD-10-CM | POA: Diagnosis not present

## 2024-08-16 DIAGNOSIS — I9589 Other hypotension: Secondary | ICD-10-CM

## 2024-08-16 DIAGNOSIS — E876 Hypokalemia: Secondary | ICD-10-CM | POA: Diagnosis not present

## 2024-08-16 DIAGNOSIS — I5032 Chronic diastolic (congestive) heart failure: Secondary | ICD-10-CM | POA: Diagnosis not present

## 2024-08-16 LAB — BASIC METABOLIC PANEL WITH GFR
Anion gap: 10 (ref 5–15)
BUN: 14 mg/dL (ref 8–23)
CO2: 32 mmol/L (ref 22–32)
Calcium: 9.1 mg/dL (ref 8.9–10.3)
Chloride: 103 mmol/L (ref 98–111)
Creatinine, Ser: 0.75 mg/dL (ref 0.44–1.00)
GFR, Estimated: >60 mL/min (ref >60)
Glucose, Bld: 137 mg/dL — ABNORMAL HIGH (ref 70–99)
Potassium: 3.4 mmol/L — ABNORMAL LOW (ref 3.5–5.1)
Sodium: 145 mmol/L (ref 135–145)

## 2024-08-16 MED ORDER — POTASSIUM CHLORIDE CRYS ER 20 MEQ PO TBCR
40.0000 meq | EXTENDED_RELEASE_TABLET | Freq: Once | ORAL | Status: AC
  Administered 2024-08-16: 40 meq via ORAL
  Filled 2024-08-16: qty 2

## 2024-08-16 NOTE — TOC Progression Note (Signed)
 Transition of Care [facility]) - Progression Note    Patient Details  Name: Kim Graham MRN: [id number] Date of Birth: Dec 06, 1950  [facility] CM/SW Contact  [name], RN Phone Number: 08/16/2024, 9:56 AM  Clinical Narrative:     Message sent to MD to determine when it is anticipated patient will medically be ready for discharge.  Patient will require insurance auth Noted patient will need IV antibiotics at discharge  Update: MD confirms patient medically stable for discharge, and will need IV antibiotics and wound vac  Massie at [facility] notified  Requested [place] with [facility] Care Management to start auth                    Expected Discharge Plan and Services                                               Social Drivers of Health (SDOH) Interventions SDOH Screenings   Food Insecurity: No Food Insecurity (08/09/2024)  Housing: Low Risk  (08/09/2024)  Transportation Needs: No Transportation Needs (08/09/2024)  Utilities: Not At Risk (08/09/2024)  Alcohol Screen: Low Risk  (08/05/2021)  Depression (PHQ2-9): Medium Risk (05/10/2024)  Financial Resource Strain: Low Risk  (05/02/2024)   Received from [facility]  Physical Activity: Insufficiently Active (08/05/2021)  Social Connections: Unknown (08/09/2024)  Stress: No Stress Concern Present (08/05/2021)  Tobacco Use: Medium Risk (08/09/2024)    Readmission Risk Interventions    08/10/2024    2:48 PM 05/20/2024    3:52 PM  Readmission Risk Prevention Plan  Transportation Screening Complete Complete  PCP or Specialist Appt within 3-5 Days  Complete  HRI or Home Care Consult  Complete  Social Work Consult for Recovery Care Planning/Counseling  Complete  Palliative Care Screening  Not Applicable  Medication Review [employer])  Complete  HRI or Home Care Consult Complete   Skilled Nursing Facility Complete

## 2024-08-16 NOTE — Progress Notes (Signed)
 PICC RN at bedside to discuss placement. After discussion of risks/benefits the patient declined placement tonight and requested that she could take time to think on it and retry tomorrow 10/9.

## 2024-08-16 NOTE — Progress Notes (Signed)
 Date of Admission:  08/08/2024     ID: Kim Graham is a 72 y.o. female  Principal Problem:   Sacral wound with infection Active Problems:   Chronic obstructive pulmonary disease (HCC)   Depression, recurrent   Hep C w/o coma, chronic (HCC)   Methadone  dependence (HCC)   Chronic kidney disease, stage 3a (HCC)   Hypokalemia   Chronic diastolic CHF (congestive heart failure) (HCC)   Iron  deficiency anemia   Chronic hypotension   Wound of right buttock    Subjective: Pt has  no specific complaints  Medications:   (feeding supplement) PROSource Plus  30 mL Oral BID BM   ARIPiprazole   30 mg Oral Daily   ascorbic acid   500 mg Oral Daily   cyanocobalamin   500 mcg Oral Daily   furosemide   20 mg Oral Daily   heparin  5,000 Units Subcutaneous Q8H   iron  polysaccharides  150 mg Oral Daily   methadone   50 mg Oral Q12H   midodrine   10 mg Oral TID WC   multivitamin with minerals  1 tablet Oral Daily   oxyCODONE   2.5 mg Oral Once    Objective: Vital signs in last 24 hours: Patient Vitals for the past 24 hrs:  BP Temp Temp src Pulse Resp SpO2 Weight  08/16/24 1710 (!) 130/53 -- -- -- -- -- --  08/16/24 1344 (!) 134/51 98.5 F (36.9 C) Oral 68 -- 95 % --  08/16/24 1158 (!) 134/51 -- -- -- -- -- --  08/16/24 0746 (!) 115/52 98.3 F (36.8 C) Oral (!) 56 -- 97 % --  08/16/24 0327 (!) 120/58 98.4 F (36.9 C) -- (!) 52 18 95 % --  08/16/24 0222 -- -- -- -- -- -- 87 kg  08/15/24 1940 (!) 127/92 98.1 F (36.7 C) -- (!) 57 18 96 % --       PHYSICAL EXAM:  General: Alert, cooperative, no distress, appears stated age. Chronically ill Lungs:b/l air entry Heart: Regular rate and rhythm, no murmur, rub or gallop. Abdomen: Soft, non-tender,not distended. Bowel sounds normal. No masses Extremities: left arm brace- wrist drop Skin: No rashes or lesions. Or bruising Lymph: Cervical, supraclavicular normal. Neurologic: left wrist drop has a splint  Lab Results    Latest Ref Rng &  Units 08/15/2024    3:31 AM 08/10/2024    3:33 AM 08/09/2024    5:08 AM  CBC  WBC 4.0 - 10.5 K/uL 7.4  5.8  6.0   Hemoglobin 12.0 - 15.0 g/dL 87.8  89.4  89.6   Hematocrit 36.0 - 46.0 % 39.1  34.1  32.9   Platelets 150 - 400 K/uL 268  225  225        Latest Ref Rng & Units 08/16/2024    3:20 AM 08/15/2024    3:31 AM 08/13/2024    5:44 AM  CMP  Glucose 70 - 99 mg/dL 862  897    BUN 8 - 23 mg/dL 14  10    Creatinine 9.55 - 1.00 mg/dL 9.24  9.30  9.33   Sodium 135 - 145 mmol/L 145  143    Potassium 3.5 - 5.1 mmol/L 3.4  2.9    Chloride 98 - 111 mmol/L 103  102    CO2 22 - 32 mmol/L 32  32    Calcium 8.9 - 10.3 mg/dL 9.1  9.2        Microbiology: WC- acinetobacter and ecoli    Assessment/Plan: Right  buttock decubitus Was unstageable Status post debridement and is a stage IV now Acinetobacter and E. coli from the culture   Pilonidal sinus excision in February 2025 The wound has not healed and now it is a deep  wound at the sacrum That has also been debrided.  Wound culture is E. coli   Patient is going to need 4 to 6 weeks of IV antibiotics There is no p.o. option to cover both organisms So will do Unasyn  3 g IV every 6.      Anemia Hypokalemia resolved Mild transaminitis   Left humerus fracture And malalignment Managed by brace Has a left wrist drop     Chronic pain syndrome on methadone    COPD   Patient will need PICC line  OPAT note entered Will follow her as OP  Discussed with care team  Would like to see the wound if the vac will be changed tomorrow

## 2024-08-16 NOTE — Progress Notes (Signed)
 PHARMACY CONSULT NOTE FOR:  OUTPATIENT  PARENTERAL ANTIBIOTIC THERAPY (OPAT)  Indication: R buttock and sacral decubitus wounds Regimen: ampicillin /sulbactam (Unasyn ) 3gm IV q6h End date: 09/07/2024  Fax weekly lab results  promptly to [phone] Please pull PICC at completion of IV antibiotics Call [phone] with critical values or questions  IV antibiotic discharge orders are pended. To discharging provider:  please sign these orders via discharge navigator,  Select New Orders & click on the button choice - Manage This Unsigned Work.     Thank you for allowing pharmacy to be a part of this patient's care.  Hyman Crossan, PharmD, BCPS, BCIDP Work Cell: [phone] 08/16/2024 10:37 AM

## 2024-08-16 NOTE — Progress Notes (Signed)
 PICC order noted. Pt will be assessed on 10/9 for insertion.

## 2024-08-16 NOTE — Progress Notes (Signed)
 PROGRESS NOTE    Kim Graham   FMW:[id number] DOB: 08/14/1951  DOA: 08/08/2024 Date of Service: 08/16/24 which is hospital day 8  PCP: Kim Graham, [facility] course / significant events:   HPI: Kim Graham is a 73 y.o. female with medical history significant of chronic pain on methadone , COPD, diet-controled type 2 diabetes, history of HCV status posttreatment, dCHF, depression, CKD-3A, bile leakage, chronic venous insufficiency, kidney stone, chronic hypotension on midodrine , who presents with sacral wound infection. Pt has hx of pilonidal cyst and underwent extensive resection in the past, with a chronic sacral wound in right buttock area. Pt is following up with Dr. Marinda of surgery. Pt states that she has worsening pain in the right buttock wound recently.  The pain is constant, aching, severe, nonradiating, aggravated by movement.  Pt was seen by Dr. Marinda today who recommended hospital admission and operative debridement.   09/30: admitted to hospitalist service w/ surgical consult, plan for OR tomorrow for debridement. IV abx  10/01: to OR today. Excisional debridement of sacral decubitus ulcer and right buttock wound measuring 11 cm x 11 cm x 6 cm with debridement through skin, subcutaneous tissue, muscle, fascia and bone. Cultures and biopsies R buttock tissue and sacral bone pending.  10/02: Tissue cultures and biopsies taken in OR - pending final reports but thus far tissue (+)rare GPC and rare gram variable rod, bone no organisms seen. Surgery team planning possible wound vac placement, continue monitor for now and await culture / pathology 10/03: BCx NGx3d, bone bx/cx and wound cx reincubated for better growth. Wound vac placed today, per surgery plan for monitor thru the weekend and plan discharge Mon 10/04: (+)acinetobacter and Ecoli, switching to meropenem abx  10/05: stable thru weekend 10/06: wound vac change today. Per general surgery d/w Dr Jordis pt ok from  their standpoint for discharge to follow outpatient. D/w ID pharmacy, no good po option for abx given culture results, per ID going to need 4 to 6 weeks of IV antibiotics w/ Unasyn  3 g IV q6h, no oral option available  10/07: pending SNF rehab placement. Ortho following up on referral 10/8: Hemodynamically stable, PICC ordered, awaiting insurance authorization for SNF  Consultants:  General surgery Infectious disease   Procedures/Surgeries: 08/09/24 Excisional debridement of sacral decubitus ulcer and buttocks ulcer w/ Dr Marinda  ASSESSMENT & PLAN:   Sacral wound with infection, not meeting sepsis criteria S/p debridement sacral/buttock wound  Methadone  dependence due to chronic pain (+)acinetobacter and Ecoli Blood cultures x 2 NG x3d Vancomycin and zosyn  --> meropenem 10/04 --> Unasyn  10/06 per ID going to need 4 to 6 weeks of IV antibiotics w/ Unasyn  3 g IV q6h, no oral option available, need antibiotic until 09/07/2024 Will need PICC prior to discharge  Wound Care: Wound vac placed 08/11/24 and exchanged 08/14/24  Continue pressure offloading, frequent repositioning, low air loss mattress  Pain control (note pt is on chronic methadone )    Chronic hypotension:  midodrine  10 mg 3 times daily   Hypokalemia Replace as needed, potassium at 3.4 today Monitor BMP  Chronic diastolic CHF (congestive heart failure) not in exacerbation:  2D echo 07/29/2020 showed EF 60 to 65% with grade 1 diastolic dysfunction.  Patient does not have SOB, only has trace leg edema, CHF seems to be compensated. Cautious restart home lasix  and doing ok  BNP - minimal elevation into 200s I&O, daily weights   Chronic kidney disease, stage 3a:  GFR> 60 today Follow-up with BMP   Chronic obstructive pulmonary disease : Stable bronchodilators and prn Mucinex   Iron  deficiency anemia: Hemoglobin stable 11.8 Continue iron  supplement   Hep C w/o coma, chronic S/p treatment.  AST 56, ALT 58, total  bilirubin 0.6  Avoid using Tylenol  / other hepatotoxins    Depression, recurrent Current continue home medications, Abilify    Humerus fx Ortho following up on their referral to trauma ortho Please check w/ Dr Lorelle prior ti pt discharge to confirm ortho follow up plan    Moderate protein caloric malnourishment: Body mass index is 19.55 kg/m.[name] Significantly low or high BMI is associated with higher medical risk.  Underweight - under 18  overweight - 25 to 29 obese - 30 or more Class 1 obesity: BMI of 30.0 to 34 Class 2 obesity: BMI of 35.0 to 39 Class 3 obesity: BMI of 40.0 to 49 Super Morbid Obesity: BMI 50-59 Super-super Morbid Obesity: BMI 60+ Healthy nutrition and physical activity advised as adjunct to other disease management and risk reduction treatments  DVT prophylaxis: heparin IV fluids: none Nutrition:  heart healthy  Code Status: FULL CODE ACP documentation reviewed:  none on file in [facility]  TOC needs: SNF Medical barriers to dispo: Medically stable, awaiting insurance authorization  Subjective / Brief ROS:  Patient was seen and examined today.  No new concern.  Had a bowel movement and finished her breakfast.  Family Communication: none at this time   Objective Findings:  Vitals:   08/16/24 0222 08/16/24 0327 08/16/24 0746 08/16/24 1158  BP:  (!) 120/58 (!) 115/52 (!) 134/51  Pulse:  (!) 52 (!) 56   Resp:  18    Temp:  98.4 F (36.9 C) 98.3 F (36.8 C)   TempSrc:   Oral   SpO2:  95% 97%   Weight: 87 kg     Height:        Intake/Output Summary (Last 24 hours) at 08/16/2024 1202 Last data filed at 08/16/2024 0442 Gross per 24 hour  Intake 680 ml  Output 100 ml  Net 580 ml   Filed Weights   08/14/24 0324 08/15/24 0341 08/16/24 0222  Weight: 84 kg 87 kg 87 kg    Examination:  General.  Frail and malnourished elderly lady, in no acute distress. Pulmonary.  Lungs clear bilaterally, normal respiratory effort. CV.  Regular rate and rhythm, no  JVD, rub or murmur. Abdomen.  Soft, nontender, nondistended, BS positive. CNS.  Alert and oriented .  No focal neurologic deficit. Extremities.  No edema, no cyanosis, pulses intact and symmetrical.  Scheduled Medications:   (feeding supplement) PROSource Plus  30 mL Oral BID BM   ARIPiprazole   30 mg Oral Daily   ascorbic acid   500 mg Oral Daily   cyanocobalamin   500 mcg Oral Daily   furosemide   20 mg Oral Daily   heparin  5,000 Units Subcutaneous Q8H   iron  polysaccharides  150 mg Oral Daily   methadone   50 mg Oral Q12H   midodrine   10 mg Oral TID WC   multivitamin with minerals  1 tablet Oral Daily   oxyCODONE   2.5 mg Oral Once    Continuous Infusions:  ampicillin -sulbactam (UNASYN ) IV 3 g (08/16/24 0524)    PRN Medications:  albuterol , dextromethorphan-guaiFENesin, ibuprofen , ondansetron  (ZOFRAN ) IV, oxyCODONE   Antimicrobials from admission:  Anti-infectives (From admission, onward)    Start     Dose/Rate Route Frequency Ordered Stop   08/14/24 2200  Ampicillin -Sulbactam (UNASYN )  3 g in sodium chloride  0.9 % 100 mL IVPB        3 g 200 mL/hr over 30 Minutes Intravenous Every 6 hours 08/14/24 1612     08/12/24 1400  meropenem (MERREM) 1 g in sodium chloride  0.9 % 100 mL IVPB  Status:  Discontinued        1 g 200 mL/hr over 30 Minutes Intravenous Every 8 hours 08/12/24 1313 08/14/24 1612   08/09/24 2000  vancomycin (VANCOREADY) IVPB 750 mg/150 mL  Status:  Discontinued        750 mg 150 mL/hr over 60 Minutes Intravenous Every 24 hours 08/08/24 2003 08/09/24 0736   08/09/24 2000  vancomycin (VANCOCIN) IVPB 1000 mg/200 mL premix  Status:  Discontinued        1,000 mg 200 mL/hr over 60 Minutes Intravenous Every 24 hours 08/09/24 0736 08/12/24 1313   08/09/24 0330  piperacillin -tazobactam (ZOSYN ) IVPB 3.375 g  Status:  Discontinued        3.375 g 12.5 mL/hr over 240 Minutes Intravenous Every 8 hours 08/08/24 1955 08/12/24 1313   08/08/24 2000  vancomycin (VANCOREADY) IVPB  1250 mg/250 mL        1,250 mg 166.7 mL/hr over 90 Minutes Intravenous  Once 08/08/24 1954 08/08/24 2235   08/08/24 1915  vancomycin (VANCOCIN) IVPB 1000 mg/200 mL premix  Status:  Discontinued        1,000 mg 200 mL/hr over 60 Minutes Intravenous  Once 08/08/24 1901 08/08/24 1954   08/08/24 1915  piperacillin -tazobactam (ZOSYN ) IVPB 3.375 g        3.375 g 100 mL/hr over 30 Minutes Intravenous  Once 08/08/24 1901 08/08/24 2017      Data Reviewed:  I have personally reviewed the following...  CBC: Recent Labs  Lab 08/10/24 0333 08/15/24 0331  WBC 5.8 7.4  HGB 10.5* 12.1  HCT 34.1* 39.1  MCV 83.6 83.5  PLT 225 268   Basic Metabolic Panel: Recent Labs  Lab 08/10/24 0333 08/13/24 0544 08/15/24 0331 08/16/24 0320  NA 139  --  143 145  K 3.6  --  2.9* 3.4*  CL 102  --  102 103  CO2 28  --  32 32  GLUCOSE 135*  --  102* 137*  BUN 12  --  10 14  CREATININE 0.85 0.66 0.69 0.75  CALCIUM 8.5*  --  9.2 9.1   GFR: Estimated Creatinine Clearance: 67.8 mL/min (by C-G formula based on SCr of 0.75 mg/dL). Liver Function Tests: No results for input(s): AST, ALT, ALKPHOS, BILITOT, PROT, ALBUMIN in the last 168 hours.  No results for input(s): LIPASE, AMYLASE in the last 168 hours. No results for input(s): AMMONIA in the last 168 hours. Coagulation Profile: No results for input(s): INR, PROTIME in the last 168 hours.  Cardiac Enzymes: No results for input(s): CKTOTAL, CKMB, CKMBINDEX, TROPONINI in the last 168 hours. BNP (last 3 results) No results for input(s): PROBNP in the last 8760 hours. HbA1C: No results for input(s): HGBA1C in the last 72 hours. CBG: Recent Labs  Lab 08/10/24 0741 08/11/24 0750 08/11/24 1141 08/11/24 2102 08/12/24 0730  GLUCAP 78 95 104* 138* 92   Lipid Profile: No results for input(s): CHOL, HDL, LDLCALC, TRIG, CHOLHDL, LDLDIRECT in the last 72 hours. Thyroid  Function Tests: No results for  input(s): TSH, T4TOTAL, FREET4, T3FREE, THYROIDAB in the last 72 hours. Anemia Panel: No results for input(s): VITAMINB12, FOLATE, FERRITIN, TIBC, IRON , RETICCTPCT in the last 72 hours. Most Recent Urinalysis  On File:     Component Value Date/Time   COLORURINE AMBER (A) 05/17/2024 1934   APPEARANCEUR CLEAR (A) 05/17/2024 1934   APPEARANCEUR Cloudy (A) 04/16/2023 1424   LABSPEC 1.019 05/17/2024 1934   PHURINE 6.0 05/17/2024 1934   GLUCOSEU NEGATIVE 05/17/2024 1934   HGBUR LARGE (A) 05/17/2024 1934   BILIRUBINUR NEGATIVE 05/17/2024 1934   BILIRUBINUR Negative 04/16/2023 1424   KETONESUR 5 (A) 05/17/2024 1934   PROTEINUR 100 (A) 05/17/2024 1934   NITRITE NEGATIVE 05/17/2024 1934   LEUKOCYTESUR TRACE (A) 05/17/2024 1934   Sepsis Labs: @LABRCNTIP (procalcitonin:4,lacticidven:4) Microbiology: Recent Results (from the past 240 hours)  Culture, blood (routine x 2)     Status: None   Collection Time: 08/08/24  6:55 PM   Specimen: BLOOD  Result Value Ref Range Status   Specimen Description BLOOD RIGHT ANTECUBITAL  Final   Special Requests   Final    BOTTLES DRAWN AEROBIC AND ANAEROBIC Blood Culture results may not be optimal due to an inadequate volume of blood received in culture bottles   Culture   Final    NO GROWTH 5 DAYS Performed at [facility], [address] [place], [place] [address]    Report Status 08/13/2024 FINAL  Final  Culture, blood (routine x 2)     Status: None   Collection Time: 08/08/24  7:20 PM   Specimen: BLOOD  Result Value Ref Range Status   Specimen Description BLOOD BLOOD RIGHT HAND  Final   Special Requests   Final    BOTTLES DRAWN AEROBIC AND ANAEROBIC Blood Culture results may not be optimal due to an inadequate volume of blood received in culture bottles   Culture   Final    NO GROWTH 5 DAYS Performed at [facility], [address], [place], [place] [address]    Report Status 08/13/2024 FINAL  Final   Aerobic/Anaerobic Culture w Gram Stain (surgical/deep wound)     Status: None   Collection Time: 08/09/24 12:09 PM   Specimen: Wound; Tissue  Result Value Ref Range Status   Specimen Description   Final    TISSUE BUTTOCKS Performed at [facility], [address], [place], [place] [address]    Special Requests   Final    NONE Performed at [facility], [address] [place], [place] [address]    Gram Stain   Final    WBC PRESENT, PREDOMINANTLY PMN RARE GRAM POSITIVE COCCI RARE GRAM VARIABLE ROD    Culture   Final    FEW ESCHERICHIA COLI FEW ACINETOBACTER CALCOACETICUS/BAUMANNII COMPLEX FEW BACTEROIDES FRAGILIS BETA LACTAMASE POSITIVE Performed at [facility], [address], [place], [place] [address]    Report Status 08/13/2024 FINAL  Final   Organism ID, Bacteria ESCHERICHIA COLI  Final   Organism ID, Bacteria ACINETOBACTER CALCOACETICUS/BAUMANNII COMPLEX  Final      Susceptibility   Acinetobacter calcoaceticus/baumannii complex - MIC*    PIP/TAZO Value in next row Intermediate      32 INTERMEDIATEThis is a modified FDA-approved test that has been validated and its performance characteristics determined by the reporting laboratory.  This laboratory is certified under the Clinical Laboratory Improvement Amendments CLIA as qualified to perform high complexity clinical laboratory testing.    AMPICILLIN /SULBACTAM Value in next row Sensitive      32 INTERMEDIATEThis is a modified FDA-approved test that has been validated and its performance characteristics determined by the reporting laboratory.  This laboratory is certified under the Clinical Laboratory  Improvement Amendments CLIA as qualified to perform high complexity clinical laboratory testing.    MEROPENEM Value in next row Sensitive      32 INTERMEDIATEThis is a modified FDA-approved test that has been validated and its performance characteristics determined by the reporting laboratory.  This  laboratory is certified under the Clinical Laboratory Improvement Amendments CLIA as qualified to perform high complexity clinical laboratory testing.    * FEW ACINETOBACTER CALCOACETICUS/BAUMANNII COMPLEX   Escherichia coli - MIC*    AMPICILLIN  Value in next row Sensitive      32 INTERMEDIATEThis is a modified FDA-approved test that has been validated and its performance characteristics determined by the reporting laboratory.  This laboratory is certified under the Clinical Laboratory Improvement Amendments CLIA as qualified to perform high complexity clinical laboratory testing.    CEFAZOLIN  (NON-URINE) Value in next row Sensitive      32 INTERMEDIATEThis is a modified FDA-approved test that has been validated and its performance characteristics determined by the reporting laboratory.  This laboratory is certified under the Clinical Laboratory Improvement Amendments CLIA as qualified to perform high complexity clinical laboratory testing.    CEFEPIME  Value in next row Sensitive      32 INTERMEDIATEThis is a modified FDA-approved test that has been validated and its performance characteristics determined by the reporting laboratory.  This laboratory is certified under the Clinical Laboratory Improvement Amendments CLIA as qualified to perform high complexity clinical laboratory testing.    ERTAPENEM Value in next row Sensitive      32 INTERMEDIATEThis is a modified FDA-approved test that has been validated and its performance characteristics determined by the reporting laboratory.  This laboratory is certified under the Clinical Laboratory Improvement Amendments CLIA as qualified to perform high complexity clinical laboratory testing.    CEFTRIAXONE  Value in next row Sensitive      32 INTERMEDIATEThis is a modified FDA-approved test that has been validated and its performance characteristics determined by the reporting laboratory.  This laboratory is certified under the Clinical Laboratory Improvement  Amendments CLIA as qualified to perform high complexity clinical laboratory testing.    CIPROFLOXACIN  Value in next row Resistant      32 INTERMEDIATEThis is a modified FDA-approved test that has been validated and its performance characteristics determined by the reporting laboratory.  This laboratory is certified under the Clinical Laboratory Improvement Amendments CLIA as qualified to perform high complexity clinical laboratory testing.    GENTAMICIN  Value in next row Sensitive      32 INTERMEDIATEThis is a modified FDA-approved test that has been validated and its performance characteristics determined by the reporting laboratory.  This laboratory is certified under the Clinical Laboratory Improvement Amendments CLIA as qualified to perform high complexity clinical laboratory testing.    MEROPENEM Value in next row Sensitive      32 INTERMEDIATEThis is a modified FDA-approved test that has been validated and its performance characteristics determined by the reporting laboratory.  This laboratory is certified under the Clinical Laboratory Improvement Amendments CLIA as qualified to perform high complexity clinical laboratory testing.    TRIMETH /SULFA  Value in next row Resistant      32 INTERMEDIATEThis is a modified FDA-approved test that has been validated and its performance characteristics determined by the reporting laboratory.  This laboratory is certified under the Clinical Laboratory Improvement Amendments CLIA as qualified to perform high complexity clinical laboratory testing.    AMPICILLIN /SULBACTAM Value in next row Sensitive      32 INTERMEDIATEThis is a modified FDA-approved test  that has been validated and its performance characteristics determined by the reporting laboratory.  This laboratory is certified under the Clinical Laboratory Improvement Amendments CLIA as qualified to perform high complexity clinical laboratory testing.    PIP/TAZO Value in next row Sensitive      <=4  SENSITIVEThis is a modified FDA-approved test that has been validated and its performance characteristics determined by the reporting laboratory.  This laboratory is certified under the Clinical Laboratory Improvement Amendments CLIA as qualified to perform high complexity clinical laboratory testing.    * FEW ESCHERICHIA COLI  Aerobic/Anaerobic Culture w Gram Stain (surgical/deep wound)     Status: None   Collection Time: 08/09/24 12:09 PM   Specimen: PATH Bone biopsy; Tissue  Result Value Ref Range Status   Specimen Description   Final    TISSUE Performed at [facility], [address] [place], [place] [address]    Special Requests   Final    BONE BOIPSY Performed at [facility], [address] [place], [place] [address]    Gram Stain   Final    RARE WBC PRESENT, PREDOMINANTLY PMN NO ORGANISMS SEEN    Culture   Final    RARE ESCHERICHIA COLI RARE CORYNEBACTERIUM STRIATUM Standardized susceptibility testing for this organism is not available. NO ANAEROBES ISOLATED Performed at [facility], [address] [place], [place] [address]    Report Status 08/14/2024 FINAL  Final   Organism ID, Bacteria ESCHERICHIA COLI  Final      Susceptibility   Escherichia coli - MIC*    AMPICILLIN  8 SENSITIVE Sensitive     CEFAZOLIN  (NON-URINE) 2 SENSITIVE Sensitive     CEFEPIME  <=0.12 SENSITIVE Sensitive     ERTAPENEM <=0.12 SENSITIVE Sensitive     CEFTRIAXONE  <=0.25 SENSITIVE Sensitive     CIPROFLOXACIN  >=4 RESISTANT Resistant     GENTAMICIN  <=1 SENSITIVE Sensitive     MEROPENEM <=0.25 SENSITIVE Sensitive     TRIMETH /SULFA  <=20 SENSITIVE Sensitive     AMPICILLIN /SULBACTAM <=2 SENSITIVE Sensitive     PIP/TAZO Value in next row Sensitive      <=4 SENSITIVEThis is a modified FDA-approved test that has been validated and its performance characteristics determined by the reporting laboratory.  This laboratory is certified under the Clinical Laboratory Improvement  Amendments CLIA as qualified to perform high complexity clinical laboratory testing.    * RARE ESCHERICHIA COLI      Radiology Studies last 3 days: US  EKG SITE RITE Result Date: 08/16/2024 If Site Rite image not attached, placement could not be confirmed due to current cardiac rhythm.  Amaryllis Dare, MD [facility] 08/16/2024, 12:02 PM    Dictation software may have been used to generate the above note. Typos may occur and escape review in typed/dictated notes. Please contact Dr Marsa directly for clarity if needed.  Staff may message me via secure chat in Epic  but this may not receive an immediate response,  please page me for urgent matters!  If 7PM-7AM, please contact night coverage [email or web address]

## 2024-08-17 DIAGNOSIS — I9589 Other hypotension: Secondary | ICD-10-CM | POA: Diagnosis not present

## 2024-08-17 DIAGNOSIS — I5032 Chronic diastolic (congestive) heart failure: Secondary | ICD-10-CM | POA: Diagnosis not present

## 2024-08-17 DIAGNOSIS — E876 Hypokalemia: Secondary | ICD-10-CM | POA: Diagnosis not present

## 2024-08-17 DIAGNOSIS — S31000A Unspecified open wound of lower back and pelvis without penetration into retroperitoneum, initial encounter: Secondary | ICD-10-CM | POA: Diagnosis not present

## 2024-08-17 MED ORDER — SODIUM CHLORIDE 0.9% FLUSH: 10.0000 mL | INTRAVENOUS | Status: DC | PRN

## 2024-08-17 MED ORDER — IBUPROFEN 400 MG PO TABS: 400.0000 mg | ORAL_TABLET | Freq: Four times a day (QID) | ORAL | Status: AC | PRN

## 2024-08-17 MED ORDER — AMPICILLIN-SULBACTAM IV (FOR PTA / DISCHARGE USE ONLY): 3.0000 g | Freq: Four times a day (QID) | INTRAVENOUS | 0 refills | Status: AC

## 2024-08-17 MED ORDER — OXYCODONE HCL 7.5 MG PO TABS: 7.5000 mg | ORAL_TABLET | ORAL | 0 refills | Status: AC | PRN

## 2024-08-17 MED ORDER — SODIUM CHLORIDE 0.9% FLUSH
10.0000 mL | Freq: Two times a day (BID) | INTRAVENOUS | Status: DC
  Administered 2024-08-17: 10 mL

## 2024-08-17 MED ORDER — METHADONE HCL 10 MG/ML PO CONC: 50.0000 mg | Freq: Two times a day (BID) | ORAL | 0 refills | Status: AC

## 2024-08-17 MED ORDER — PROSOURCE PLUS PO LIQD: 30.0000 mL | Freq: Two times a day (BID) | ORAL | Status: AC

## 2024-08-17 MED ORDER — CHLORHEXIDINE GLUCONATE CLOTH 2 % EX PADS
6.0000 | MEDICATED_PAD | Freq: Every day | CUTANEOUS | Status: DC
  Administered 2024-08-17: 6 via TOPICAL

## 2024-08-17 MED ORDER — DM-GUAIFENESIN ER 30-600 MG PO TB12: 1.0000 | ORAL_TABLET | Freq: Two times a day (BID) | ORAL | Status: AC | PRN

## 2024-08-17 NOTE — Progress Notes (Signed)
 Physical Therapy Treatment Patient Details Name: Kim Graham MRN: [id number] DOB: 1951-03-17 Today's Date: 08/17/2024   History of Present Illness Pt is a 73 y/o F admitted on 08/08/24 after presenting with worsening pain in chronic sacral wound. Pt is s/p debridement on 08/09/24. PMH: chronic pain on methadone , COPD, diet-controlled DM2, HCV, dCHF, depression, CKD 3A, bile leakage, chronic venous insufficiency, kidney stone, chronic hypotension on midodrine     PT Comments  Patient received in bed, she is agreeable to PT session. Reports she had PICC line placed this am. She requires min/mod A for bed mobility. Min hand held A for standing and step pivoting to recliner. Patient is limited by fatigue and pain. She will continue to benefit from skilled PT to improve strength and independence.     If plan is discharge home, recommend the following: A lot of help with walking and/or transfers;A lot of help with bathing/dressing/bathroom;Assist for transportation;Assistance with cooking/housework;A little help with bathing/dressing/bathroom;Help with stairs or ramp for entrance   Can travel by private vehicle     No  Equipment Recommendations  Other (comment) (TBD)    Recommendations for Other Services       Precautions / Restrictions Precautions Precautions: Fall Recall of Precautions/Restrictions: Intact Precaution/Restrictions Comments: wound vac placement lumbosacral region Required Braces or Orthoses: Other Brace Other Brace: LUE wrist brace, L shoulder brace Restrictions Weight Bearing Restrictions Per Provider Order: Yes LUE Weight Bearing Per Provider Order: Weight bearing as tolerated     Mobility  Bed Mobility Overal bed mobility: Needs Assistance Bed Mobility: Supine to Sit Rolling: Min assist Sidelying to sit: Min assist       General bed mobility comments: mod A to scoot to edge of bed on air mattress    Transfers Overall transfer level: Needs  assistance Equipment used: 1 person hand held assist Transfers: Sit to/from Stand, Bed to chair/wheelchair/BSC Sit to Stand: Min assist   Step pivot transfers: Min assist            Ambulation/Gait Ambulation/Gait assistance: Min assist Gait Distance (Feet): 3 Feet Assistive device: 1 person hand held assist Gait Pattern/deviations: Step-to pattern Gait velocity: decr     General Gait Details: patient limited by weakness and pain.   Stairs             Wheelchair Mobility     Tilt Bed    Modified Rankin (Stroke Patients Only)       Balance Overall balance assessment: Needs assistance Sitting-balance support: Feet supported Sitting balance-Leahy Scale: Good     Standing balance support: Single extremity supported, During functional activity Standing balance-Leahy Scale: Poor Standing balance comment: mild posterior leaning in standing, but able to step pivot to recliner with single to double UE support                            Communication Communication Communication: No apparent difficulties  Cognition Arousal: Alert Behavior During Therapy: WFL for tasks assessed/performed   PT - Cognitive impairments: No apparent impairments                         Following commands: Intact      Cueing Cueing Techniques: Verbal cues  Exercises      General Comments        Pertinent Vitals/Pain Pain Assessment Pain Assessment: Faces Faces Pain Scale: Hurts even more Pain Location: sacral wound Pain Descriptors / Indicators: Discomfort,  Grimacing Pain Intervention(s): Monitored during session, Repositioned, Patient requesting pain meds-RN notified    Home Living                          Prior Function            PT Goals (current goals can now be found in the care plan section) Acute Rehab PT Goals Patient Stated Goal: get better PT Goal Formulation: With patient Time For Goal Achievement: 08/24/24 Potential  to Achieve Goals: Fair Additional Goals Additional Goal #1: Pt will be able to demonstrate ability to turn/rotate in bed every 2 hours for pressure relief without assistance. Progress towards PT goals: Progressing toward goals    Frequency    Min 2X/week      PT Plan      Co-evaluation              AM-PAC PT 6 Clicks Mobility   Outcome Measure  Help needed turning from your back to your side while in a flat bed without using bedrails?: A Little Help needed moving from lying on your back to sitting on the side of a flat bed without using bedrails?: A Little Help needed moving to and from a bed to a chair (including a wheelchair)?: A Little Help needed standing up from a chair using your arms (e.g., wheelchair or bedside chair)?: A Little Help needed to walk in hospital room?: A Lot Help needed climbing 3-5 steps with a railing? : Total 6 Click Score: 15    End of Session   Activity Tolerance: Patient limited by pain;Patient limited by fatigue Patient left: in chair;with call bell/phone within reach Nurse Communication: Mobility status;Patient requests pain meds PT Visit Diagnosis: Muscle weakness (generalized) (M62.81);Other abnormalities of gait and mobility (R26.89);Unsteadiness on feet (R26.81);Difficulty in walking, not elsewhere classified (R26.2);Pain Pain - part of body:  (sacrum)     Time: 8970-8957 PT Time Calculation (min) (ACUTE ONLY): 13 min  Charges:    $Therapeutic Activity: 8-22 mins PT General Charges $$ ACUTE PT VISIT: 1 Visit                     Nikolas Casher, PT, GCS 08/17/24,10:50 AM

## 2024-08-17 NOTE — Discharge Summary (Addendum)
 Physician Discharge Summary   Patient: Kim Graham MRN: [id number] DOB: [birth year]  Admit date:     08/08/2024  Discharge date: 08/17/24  Discharge Physician: Kim Graham   PCP: Kim Duwaine SQUIBB, DO   Recommendations at discharge:  Please obtain CBC and CMP as directed by infectious disease Please follow the directions for continuation of IV antibiotics Please apply wound VAC and change twice weekly once patient arrived at facility Follow-up with general surgery Follow-up with wound care Follow-up with infectious disease Follow-up with orthopedic surgery  Discharge Diagnoses: Principal Problem:   Sacral wound with infection Active Problems:   Chronic hypotension   Hypokalemia   Chronic diastolic CHF (congestive heart failure) (HCC)   Chronic kidney disease, stage 3a (HCC)   Chronic obstructive pulmonary disease (HCC)   Methadone  dependence (HCC)   Iron  deficiency anemia   Hep C w/o coma, chronic (HCC)   Depression, recurrent   Wound of right buttock   Hospital Course: Hospital course / significant events:   HPI: Kim Graham is a 73 y.o. female with medical history significant of chronic pain on methadone , COPD, diet-controled type 2 diabetes, history of HCV status posttreatment, dCHF, depression, CKD-3A, bile leakage, chronic venous insufficiency, kidney stone, chronic hypotension on midodrine , who presents with sacral wound infection. Pt has hx of pilonidal cyst and underwent extensive resection in the past, with a chronic sacral wound in right buttock area. Pt is following up with Kim Graham of surgery. Pt states that she has worsening pain in the right buttock wound recently.  The pain is constant, aching, severe, nonradiating, aggravated by movement.  Pt was seen by Kim Graham today who recommended hospital admission and operative debridement.   09/30: admitted to hospitalist service w/ surgical consult, plan for OR tomorrow for debridement. IV abx  10/01: to OR today.  Excisional debridement of sacral decubitus ulcer and right buttock wound measuring 11 cm x 11 cm x 6 cm with debridement through skin, subcutaneous tissue, muscle, fascia and bone. Cultures and biopsies R buttock tissue and sacral bone pending.  10/02: Tissue cultures and biopsies taken in OR - pending final reports but thus far tissue (+)rare GPC and rare gram variable rod, bone no organisms seen. Surgery team planning possible wound vac placement, continue monitor for now and await culture / pathology 10/03: BCx NGx3d, bone bx/cx and wound cx reincubated for better growth. Wound vac placed today, per surgery plan for monitor thru the weekend and plan discharge Mon 10/04: (+)acinetobacter and Ecoli, switching to meropenem abx  10/05: stable thru weekend 10/06: wound vac change today. Per general surgery d/w Kim Graham pt ok from their standpoint for discharge to follow outpatient. D/w ID pharmacy, no good po option for abx given culture results, per ID going to need 4 to 6 weeks of IV antibiotics w/ Unasyn  3 g IV q6h, no oral option available  10/07: pending SNF rehab placement. Ortho following up on referral 10/8: Hemodynamically stable, PICC ordered, awaiting insurance authorization for SNF 10/9: PICC line was placed and patient will continue with Unasyn  until 09/07/2024 as directed by infectious disease.  Wound VAC was removed before discharge and should be replaced at facility. She was given a prescription of Oxy, and 300 mL of methadone  as that was her chronic medicine, oxy she will use as an acute pain management, she need to follow-up with her pain clinic providers for further assistance.  Patient will continue on current medications and need to have a close follow-up  with her providers for further management.  Consultants:  General surgery Infectious disease   Procedures/Surgeries: 08/09/24 Excisional debridement of sacral decubitus ulcer and buttocks ulcer w/ Kim Graham  ASSESSMENT &  PLAN:   Sacral wound with infection, not meeting sepsis criteria S/p debridement sacral/buttock wound  Methadone  dependence due to chronic pain (+)acinetobacter and Ecoli Blood cultures x 2 NG x3d Vancomycin and zosyn  --> meropenem 10/04 --> Unasyn  10/06 per ID going to need 4 to 6 weeks of IV antibiotics w/ Unasyn  3 g IV q6h, no oral option available, need antibiotic until 09/07/2024 Will need PICC prior to discharge  Wound Care: Wound vac placed 08/11/24 and exchanged 08/14/24  Continue pressure offloading, frequent repositioning, low air loss mattress  Pain control (note pt is on chronic methadone )    Chronic hypotension:  midodrine  10 mg 3 times daily   Hypokalemia Replace as needed, potassium at 3.4 today Monitor BMP  Chronic diastolic CHF (congestive heart failure) not in exacerbation:  2D echo 07/29/2020 showed EF 60 to 65% with grade 1 diastolic dysfunction.  Patient does not have SOB, only has trace leg edema, CHF seems to be compensated. Cautious restart home lasix  and doing ok  BNP - minimal elevation into 200s I&O, daily weights   Chronic kidney disease, stage 3a:   GFR> 60 today Follow-up with BMP   Chronic obstructive pulmonary disease : Stable bronchodilators and prn Mucinex   Iron  deficiency anemia: Hemoglobin stable 11.8 Continue iron  supplement   Hep C w/o coma, chronic S/p treatment.  AST 56, ALT 58, total bilirubin 0.6  Avoid using Tylenol  / other hepatotoxins    Depression, recurrent Current continue home medications, Abilify    Humerus fx Ortho following up on their referral to trauma ortho   Moderate protein caloric malnourishment: Body mass index is 19.55 kg/m.[name] Significantly low or high BMI is associated with higher medical risk.  Underweight - under 18  overweight - 25 to 29 obese - 30 or more Class 1 obesity: BMI of 30.0 to 34 Class 2 obesity: BMI of 35.0 to 39 Class 3 obesity: BMI of 40.0 to 49 Super Morbid Obesity: BMI  50-59 Super-super Morbid Obesity: BMI 60+ Healthy nutrition and physical activity advised as adjunct to other disease management and risk reduction treatments       Pain control - [place]  Controlled Substance Reporting System database was reviewed. and patient was instructed, not to drive, operate heavy machinery, perform activities at heights, swimming or participation in water  activities or provide baby-sitting services while on Pain, Sleep and Anxiety Medications; until their outpatient Physician has advised to do so again. Also recommended to not to take more than prescribed Pain, Sleep and Anxiety Medications.    Disposition: Skilled nursing facility Diet recommendation:  Discharge Diet Orders (From admission, onward)     Start     Ordered   08/17/24 0000  Diet - low sodium heart healthy        08/17/24 1205           Cardiac diet DISCHARGE MEDICATION: Allergies as of 08/17/2024       Reactions   Doxycycline  Diarrhea   Very bad diarrhea        Medication List     TAKE these medications    (feeding supplement) PROSource Plus liquid Take 30 mLs by mouth 2 (two) times daily between meals.   acetaminophen  325 MG tablet Commonly known as: TYLENOL  Take 2 tablets (650 mg total) by mouth every 6 (six) hours  as needed for mild pain (pain score 1-3), fever or headache (or Fever >/= 101).   albuterol  108 (90 Base) MCG/ACT inhaler Commonly known as: VENTOLIN  HFA Inhale 2 puffs into the lungs every 6 (six) hours as needed for wheezing or shortness of breath.   ampicillin -sulbactam IVPB Commonly known as: UNASYN  Inject 3 g into the vein every 6 (six) hours for 22 days. Indication:  R buttock and sacral decubitus wounds Last Day of Therapy:  09/07/2024 Labs - Once weekly:  CBC/D, CMP, ESR and CRP Fax weekly lab results  promptly to [phone] Method of administration: Mini-Bag Plus / Gravity Method of administration may be changed at the discretion of nursing  facility and/or its pharmacy Please pull PIC at completion of IV antibiotics Call [phone] with critical values or questions   ARIPiprazole  30 MG tablet Commonly known as: ABILIFY  Take 1 tablet (30 mg total) by mouth daily.   ascorbic acid  500 MG tablet Commonly known as: VITAMIN C  Take 1 tablet (500 mg total) by mouth daily.   Breztri  Aerosphere 160-9-4.8 MCG/ACT Aero inhaler Generic drug: budesonide -glycopyrrolate -formoterol  Inhale 2 puffs into the lungs 2 (two) times daily.   cyanocobalamin  500 MCG tablet Commonly known as: VITAMIN B12 Take 1 tablet (500 mcg total) by mouth daily.   dextromethorphan-guaiFENesin 30-600 MG 12hr tablet Commonly known as: MUCINEX DM Take 1 tablet by mouth 2 (two) times daily as needed for cough.   fluticasone  50 MCG/ACT nasal spray Commonly known as: FLONASE  Place 2 sprays into both nostrils daily.   furosemide  20 MG tablet Commonly known as: LASIX  Take 20 mg by mouth daily.   ibuprofen  400 MG tablet Commonly known as: ADVIL  Take 1 tablet (400 mg total) by mouth every 6 (six) hours as needed for fever, headache or mild pain (pain score 1-3).   iron  polysaccharides 150 MG capsule Commonly known as: NIFEREX Take 1 capsule (150 mg total) by mouth daily.   methadone  10 MG/ML solution Commonly known as: DOLOPHINE  Take 5 mLs (50 mg total) by mouth every 12 (twelve) hours. What changed:  how much to take when to take this Another medication with the same name was removed. Continue taking this medication, and follow the directions you see here.   midodrine  10 MG tablet Commonly known as: PROAMATINE  Take 1 tablet (10 mg total) by mouth 3 (three) times daily with meals. Hold if SBP > 120 mmHg and/or HR <65   Multi-Vitamin tablet Take 1 tablet by mouth daily.   oxyCODONE  HCl 7.5 MG Tabs Take 7.5 mg by mouth every 4 (four) hours as needed for moderate pain (pain score 4-6) or severe pain (pain score 7-10). What changed:  medication  strength how much to take when to take this reasons to take this   potassium chloride  10 MEQ tablet Commonly known as: KLOR-CON  M Take 10 mEq by mouth 2 (two) times daily.   Spacer/Aero-Holding [employer] Use as directed Dx: COPD   Trelegy Ellipta  100-62.5-25 MCG/ACT Aepb Generic drug: Fluticasone -Umeclidin-Vilant Inhale 1 puff into the lungs daily.   Vitamin D  (Ergocalciferol ) 1.25 MG (50000 UNIT) Caps capsule Commonly known as: DRISDOL  Take 1 capsule (50,000 Units total) by mouth every 7 (seven) days.               Discharge Care Instructions  (From admission, onward)           Start     Ordered   08/17/24 0000  Change dressing on IV access line weekly and PRN  (  Home infusion instructions - [facility] )        08/17/24 1205   08/17/24 0000  Discharge wound care:       Comments: Cleanse B heels with soap and water , dry and apply Xeroform gauze ([place] [phone]) to wound beds daily and secure with silicone foam or Kerlix roll gauze. Place B feet in Prevalon boots Soila [phone]) to offload pressure  Place wound VAC once arrived at the facility, to be changed twice weekly   08/17/24 1205            Discharge Exam: Filed Weights   08/14/24 0324 08/15/24 0341 08/16/24 0222  Weight: 84 kg 87 kg 87 kg   General.  Severely malnourished elderly lady, in no acute distress. Pulmonary.  Lungs clear bilaterally, normal respiratory effort. CV.  Regular rate and rhythm, no JVD, rub or murmur. Abdomen.  Soft, nontender, nondistended, BS positive. CNS.  Alert and oriented .  No focal neurologic deficit. Extremities.  No edema, no cyanosis, pulses intact and symmetrical.  Condition at discharge: stable  The results of significant diagnostics from this hospitalization (including imaging, microbiology, ancillary and laboratory) are listed below for reference.   Imaging Studies: US  EKG SITE RITE Result Date: 08/16/2024 If Site Rite image not attached,  placement could not be confirmed due to current cardiac rhythm.  DG Humerus Left Result Date: 08/10/2024 CLINICAL DATA:  01866 Fracture, humerus 01866 EXAM: LEFT HUMERUS - 2+ VIEW COMPARISON:  June 09, 2024 FINDINGS: Revisualization of a fracture of the proximal humeral shaft. There is persistent foreshortening and mild angulation. There is persistent displacement of the distal fragment anteriorly and laterally. There has been progressive callus formation. Osteopenia. Similar appearance of a fracture of the humeral head with increased adjacent callus formation along the greater tubercle. IMPRESSION: Healing humeral fractures in similar alignment. Electronically Signed   By: Corean Salter M.D.   On: 08/10/2024 16:51    Microbiology: Results for orders placed or performed during the hospital encounter of 08/08/24  Culture, blood (routine x 2)     Status: None   Collection Time: 08/08/24  6:55 PM   Specimen: BLOOD  Result Value Ref Range Status   Specimen Description BLOOD RIGHT ANTECUBITAL  Final   Special Requests   Final    BOTTLES DRAWN AEROBIC AND ANAEROBIC Blood Culture results may not be optimal due to an inadequate volume of blood received in culture bottles   Culture   Final    NO GROWTH 5 DAYS Performed at [facility], [address] [place], [place] [address]    Report Status 08/13/2024 FINAL  Final  Culture, blood (routine x 2)     Status: None   Collection Time: 08/08/24  7:20 PM   Specimen: BLOOD  Result Value Ref Range Status   Specimen Description BLOOD BLOOD RIGHT HAND  Final   Special Requests   Final    BOTTLES DRAWN AEROBIC AND ANAEROBIC Blood Culture results may not be optimal due to an inadequate volume of blood received in culture bottles   Culture   Final    NO GROWTH 5 DAYS Performed at [facility], [address] [place], [place] [address]    Report Status 08/13/2024 FINAL  Final  Aerobic/Anaerobic Culture w Gram Stain  (surgical/deep wound)     Status: None   Collection Time: 08/09/24 12:09 PM   Specimen: Wound; Tissue  Result Value Ref Range Status   Specimen Description   Final  TISSUE BUTTOCKS Performed at [facility], [address] [place], [place] [address]    Special Requests   Final    NONE Performed at [facility], [address] [place], [place] [address]    Gram Stain   Final    WBC PRESENT, PREDOMINANTLY PMN RARE GRAM POSITIVE COCCI RARE GRAM VARIABLE ROD    Culture   Final    FEW ESCHERICHIA COLI FEW ACINETOBACTER CALCOACETICUS/BAUMANNII COMPLEX FEW BACTEROIDES FRAGILIS BETA LACTAMASE POSITIVE Performed at [facility], [address] [place], [place] [address]    Report Status 08/13/2024 FINAL  Final   Organism ID, Bacteria ESCHERICHIA COLI  Final   Organism ID, Bacteria ACINETOBACTER CALCOACETICUS/BAUMANNII COMPLEX  Final      Susceptibility   Acinetobacter calcoaceticus/baumannii complex - MIC*    PIP/TAZO Value in next row Intermediate      32 INTERMEDIATEThis is a modified FDA-approved test that has been validated and its performance characteristics determined by the reporting laboratory.  This laboratory is certified under the Clinical Laboratory Improvement Amendments CLIA as qualified to perform high complexity clinical laboratory testing.    AMPICILLIN /SULBACTAM Value in next row Sensitive      32 INTERMEDIATEThis is a modified FDA-approved test that has been validated and its performance characteristics determined by the reporting laboratory.  This laboratory is certified under the Clinical Laboratory Improvement Amendments CLIA as qualified to perform high complexity clinical laboratory testing.    MEROPENEM Value in next row Sensitive      32 INTERMEDIATEThis is a modified FDA-approved test that has been validated and its performance characteristics determined by the reporting laboratory.  This laboratory is certified under the Clinical  Laboratory Improvement Amendments CLIA as qualified to perform high complexity clinical laboratory testing.    * FEW ACINETOBACTER CALCOACETICUS/BAUMANNII COMPLEX   Escherichia coli - MIC*    AMPICILLIN  Value in next row Sensitive      32 INTERMEDIATEThis is a modified FDA-approved test that has been validated and its performance characteristics determined by the reporting laboratory.  This laboratory is certified under the Clinical Laboratory Improvement Amendments CLIA as qualified to perform high complexity clinical laboratory testing.    CEFAZOLIN  (NON-URINE) Value in next row Sensitive      32 INTERMEDIATEThis is a modified FDA-approved test that has been validated and its performance characteristics determined by the reporting laboratory.  This laboratory is certified under the Clinical Laboratory Improvement Amendments CLIA as qualified to perform high complexity clinical laboratory testing.    CEFEPIME  Value in next row Sensitive      32 INTERMEDIATEThis is a modified FDA-approved test that has been validated and its performance characteristics determined by the reporting laboratory.  This laboratory is certified under the Clinical Laboratory Improvement Amendments CLIA as qualified to perform high complexity clinical laboratory testing.    ERTAPENEM Value in next row Sensitive      32 INTERMEDIATEThis is a modified FDA-approved test that has been validated and its performance characteristics determined by the reporting laboratory.  This laboratory is certified under the Clinical Laboratory Improvement Amendments CLIA as qualified to perform high complexity clinical laboratory testing.    CEFTRIAXONE  Value in next row Sensitive      32 INTERMEDIATEThis is a modified FDA-approved test that has been validated and its performance characteristics determined by the reporting laboratory.  This laboratory is certified under the Clinical Laboratory Improvement Amendments CLIA as qualified to perform high  complexity clinical laboratory testing.    CIPROFLOXACIN   Value in next row Resistant      32 INTERMEDIATEThis is a modified FDA-approved test that has been validated and its performance characteristics determined by the reporting laboratory.  This laboratory is certified under the Clinical Laboratory Improvement Amendments CLIA as qualified to perform high complexity clinical laboratory testing.    GENTAMICIN  Value in next row Sensitive      32 INTERMEDIATEThis is a modified FDA-approved test that has been validated and its performance characteristics determined by the reporting laboratory.  This laboratory is certified under the Clinical Laboratory Improvement Amendments CLIA as qualified to perform high complexity clinical laboratory testing.    MEROPENEM Value in next row Sensitive      32 INTERMEDIATEThis is a modified FDA-approved test that has been validated and its performance characteristics determined by the reporting laboratory.  This laboratory is certified under the Clinical Laboratory Improvement Amendments CLIA as qualified to perform high complexity clinical laboratory testing.    TRIMETH /SULFA  Value in next row Resistant      32 INTERMEDIATEThis is a modified FDA-approved test that has been validated and its performance characteristics determined by the reporting laboratory.  This laboratory is certified under the Clinical Laboratory Improvement Amendments CLIA as qualified to perform high complexity clinical laboratory testing.    AMPICILLIN /SULBACTAM Value in next row Sensitive      32 INTERMEDIATEThis is a modified FDA-approved test that has been validated and its performance characteristics determined by the reporting laboratory.  This laboratory is certified under the Clinical Laboratory Improvement Amendments CLIA as qualified to perform high complexity clinical laboratory testing.    PIP/TAZO Value in next row Sensitive      <=4 SENSITIVEThis is a modified FDA-approved test that  has been validated and its performance characteristics determined by the reporting laboratory.  This laboratory is certified under the Clinical Laboratory Improvement Amendments CLIA as qualified to perform high complexity clinical laboratory testing.    * FEW ESCHERICHIA COLI  Aerobic/Anaerobic Culture w Gram Stain (surgical/deep wound)     Status: None   Collection Time: 08/09/24 12:09 PM   Specimen: PATH Bone biopsy; Tissue  Result Value Ref Range Status   Specimen Description   Final    TISSUE Performed at [facility], [address], [place], [place] [address]    Special Requests   Final    BONE BOIPSY Performed at [facility], [address] [place], [place] [address]    Gram Stain   Final    RARE WBC PRESENT, PREDOMINANTLY PMN NO ORGANISMS SEEN    Culture   Final    RARE ESCHERICHIA COLI RARE CORYNEBACTERIUM STRIATUM Standardized susceptibility testing for this organism is not available. NO ANAEROBES ISOLATED Performed at [facility], [address] [place], [place] [address]    Report Status 08/14/2024 FINAL  Final   Organism ID, Bacteria ESCHERICHIA COLI  Final      Susceptibility   Escherichia coli - MIC*    AMPICILLIN  8 SENSITIVE Sensitive     CEFAZOLIN  (NON-URINE) 2 SENSITIVE Sensitive     CEFEPIME  <=0.12 SENSITIVE Sensitive     ERTAPENEM <=0.12 SENSITIVE Sensitive     CEFTRIAXONE  <=0.25 SENSITIVE Sensitive     CIPROFLOXACIN  >=4 RESISTANT Resistant     GENTAMICIN  <=1 SENSITIVE Sensitive     MEROPENEM <=0.25 SENSITIVE Sensitive     TRIMETH /SULFA  <=20 SENSITIVE Sensitive     AMPICILLIN /SULBACTAM <=2 SENSITIVE Sensitive     PIP/TAZO Value in next row Sensitive      <=  4 SENSITIVEThis is a modified FDA-approved test that has been validated and its performance characteristics determined by the reporting laboratory.  This laboratory is certified under the Clinical Laboratory Improvement Amendments CLIA as qualified to perform high complexity  clinical laboratory testing.    * RARE ESCHERICHIA COLI    Labs: CBC: Recent Labs  Lab 08/15/24 0331  WBC 7.4  HGB 12.1  HCT 39.1  MCV 83.5  PLT 268   Basic Metabolic Panel: Recent Labs  Lab 08/13/24 0544 08/15/24 0331 08/16/24 0320  NA  --  143 145  K  --  2.9* 3.4*  CL  --  102 103  CO2  --  32 32  GLUCOSE  --  102* 137*  BUN  --  10 14  CREATININE 0.66 0.69 0.75  CALCIUM  --  9.2 9.1   Liver Function Tests: No results for input(s): AST, ALT, ALKPHOS, BILITOT, PROT, ALBUMIN in the last 168 hours. CBG: Recent Labs  Lab 08/11/24 0750 08/11/24 1141 08/11/24 2102 08/12/24 0730  GLUCAP 95 104* 138* 92    Discharge time spent: greater than 30 minutes.  This record has been created using Conservation officer, historic buildings. Errors have been sought and corrected,but may not always be located. Such creation errors do not reflect on the standard of care.   Signed: Amaryllis Dare, MD [facility] 08/17/2024

## 2024-08-17 NOTE — TOC Progression Note (Signed)
 Transition of Care [facility]) - Progression Note    Patient Details  Name: Kim Graham MRN: [id number] Date of Birth: 1951-03-21  Transition of Care [facility] CM/SW Contact  Kim [name], RN Phone Number: 08/17/2024, 9:48 AM  Clinical Narrative:      CM checked portal for update on auth for [place] [facility].  No auth pending.  Reached out to [place] with IP Care Management who is to start auth                    Expected Discharge Plan and Services                                               Social Drivers of Health (SDOH) Interventions SDOH Screenings   Food Insecurity: No Food Insecurity (08/09/2024)  Housing: Low Risk  (08/09/2024)  Transportation Needs: No Transportation Needs (08/09/2024)  Utilities: Not At Risk (08/09/2024)  Alcohol Screen: Low Risk  (08/05/2021)  Depression (PHQ2-9): Medium Risk (05/10/2024)  Financial Resource Strain: Low Risk  (05/02/2024)   Received from [facility]  Physical Activity: Insufficiently Active (08/05/2021)  Social Connections: Unknown (08/09/2024)  Stress: No Stress Concern Present (08/05/2021)  Tobacco Use: Medium Risk (08/09/2024)    Readmission Risk Interventions    08/10/2024    2:48 PM 05/20/2024    3:52 PM  Readmission Risk Prevention Plan  Transportation Screening Complete Complete  PCP or Specialist Appt within 3-5 Days  Complete  HRI or Home Care Consult  Complete  Social Work Consult for Recovery Care Planning/Counseling  Complete  Palliative Care Screening  Not Applicable  Medication Review Oceanographer)  Complete  HRI or Home Care Consult Complete   Skilled Nursing Facility Complete

## 2024-08-17 NOTE — Progress Notes (Signed)
 Peripherally Inserted Central Catheter Placement  The IV Nurse has discussed with the patient and/or persons authorized to consent for the patient, the purpose of this procedure and the potential benefits and risks involved with this procedure.  The benefits include less needle sticks, lab draws from the catheter, and the patient may be discharged home with the catheter. Risks include, but not limited to, infection, bleeding, blood clot (thrombus formation), and puncture of an artery; nerve damage and irregular heartbeat and possibility to perform a PICC exchange if needed/ordered by physician.  Alternatives to this procedure were also discussed.  Bard Power PICC patient education guide, fact sheet on infection prevention and patient information card has been provided to patient /or left at bedside.    PICC Placement Documentation  PICC Single Lumen 08/17/24 Right Brachial 31 cm 0 cm (Active)  Indication for Insertion or Continuance of Line Home intravenous therapies (PICC only) 08/17/24 0900  Exposed Catheter (cm) 0 cm 08/17/24 0900  Site Assessment Clean, Dry, Intact 08/17/24 0900  Line Status Flushed;Saline locked;Blood return noted 08/17/24 0900  Dressing Type Transparent;Securing device 08/17/24 0900  Dressing Status Antimicrobial disc/dressing in place;Clean, Dry, Intact 08/17/24 0900  Line Care Connections checked and tightened 08/17/24 0900  Line Adjustment (NICU/IV Team Only) No 08/17/24 0900  Dressing Intervention New dressing;Adhesive placed at insertion site (IV team only) 08/17/24 0900  Dressing Change Due 08/24/24 08/17/24 0900       Ethyl Priestly Renee 08/17/2024, 9:27 AM

## 2024-08-17 NOTE — Plan of Care (Signed)

## 2024-08-17 NOTE — Progress Notes (Signed)
 Date of Admission:  08/08/2024     ID: Kim Graham is a 73 y.o. female  Principal Problem:   Sacral wound with infection Active Problems:   Chronic obstructive pulmonary disease (HCC)   Depression, recurrent   Hep C w/o coma, chronic (HCC)   Methadone  dependence (HCC)   Chronic kidney disease, stage 3a (HCC)   Hypokalemia   Chronic diastolic CHF (congestive heart failure) (HCC)   Iron  deficiency anemia   Chronic hypotension   Wound of right buttock    Subjective: Pt is doing okay Pain while changing dressing  Medications:   (feeding supplement) PROSource Plus  30 mL Oral BID BM   ARIPiprazole   30 mg Oral Daily   ascorbic acid   500 mg Oral Daily   Chlorhexidine  Gluconate Cloth  6 each Topical Daily   cyanocobalamin   500 mcg Oral Daily   furosemide   20 mg Oral Daily   heparin  5,000 Units Subcutaneous Q8H   iron  polysaccharides  150 mg Oral Daily   methadone   50 mg Oral Q12H   midodrine   10 mg Oral TID WC   multivitamin with minerals  1 tablet Oral Daily   oxyCODONE   2.5 mg Oral Once   sodium chloride  flush  10-40 mL Intracatheter Q12H    Objective: Vital signs in last 24 hours: Patient Vitals for the past 24 hrs:  BP Temp Temp src Pulse Resp SpO2  08/17/24 1217 (!) 131/56 -- -- -- -- --  08/17/24 0715 115/61 98.4 F (36.9 C) Oral 76 17 95 %  08/17/24 0342 139/64 97.6 F (36.4 C) Oral 61 15 94 %  08/16/24 2000 (!) 134/53 98.5 F (36.9 C) Oral 62 16 95 %  08/16/24 1710 (!) 130/53 -- -- -- -- --       PHYSICAL EXAM:  General: Alert, cooperative, no distress, appears stated age. Chronically ill Lungs:b/l air entry Heart: Regular rate and rhythm, no murmur, rub or gallop. Abdomen: Soft, non-tender,not distended. Bowel sounds normal. No masses Extremities: left arm brace- wrist drop Rt Buttock wound'/coccyx wound     Lymph: Cervical, supraclavicular normal. Neurologic: left wrist drop has a splint  Lab Results    Latest Ref Rng & Units 08/15/2024     3:31 AM 08/10/2024    3:33 AM 08/09/2024    5:08 AM  CBC  WBC 4.0 - 10.5 K/uL 7.4  5.8  6.0   Hemoglobin 12.0 - 15.0 g/dL 87.8  89.4  89.6   Hematocrit 36.0 - 46.0 % 39.1  34.1  32.9   Platelets 150 - 400 K/uL 268  225  225        Latest Ref Rng & Units 08/16/2024    3:20 AM 08/15/2024    3:31 AM 08/13/2024    5:44 AM  CMP  Glucose 70 - 99 mg/dL 862  897    BUN 8 - 23 mg/dL 14  10    Creatinine 9.55 - 1.00 mg/dL 9.24  9.30  9.33   Sodium 135 - 145 mmol/L 145  143    Potassium 3.5 - 5.1 mmol/L 3.4  2.9    Chloride 98 - 111 mmol/L 103  102    CO2 22 - 32 mmol/L 32  32    Calcium 8.9 - 10.3 mg/dL 9.1  9.2        Microbiology: WC- acinetobacter and ecoli    Assessment/Plan: Right buttock decubitus Was unstageable Status post debridement and is a stage IV now Acinetobacter  and E. coli from the culture The wounds look clean and granulationg Continue wound vac in SNF   Pilonidal sinus excision in February 2025 The wound has not healed and now it is a deep  wound at the sacrum That has also been debrided.  Wound culture is E. coli   Patient is going to need 4 to 6 weeks of IV antibiotics There is no p.o. option to cover both organisms So will do Unasyn  3 g IV every 6.      Anemia Hypokalemia resolved Mild transaminitis   Left humerus fracture And malalignment Managed by brace Has a left wrist drop     Chronic pain syndrome on methadone    COPD   Patient will need PICC line  [facility] note entered Will follow her as OP  Discussed with care team ID will sig noff

## 2024-08-17 NOTE — Plan of Care (Signed)

## 2024-08-17 NOTE — TOC Transition Note (Signed)
 Transition of Care [facility]) - Discharge Note   Patient Details  Name: Kim Graham MRN: [id number] Date of Birth: 1950/12/25  [facility] CM/SW Contact:  [name], RN Phone Number: 08/17/2024, 12:41 PM   Clinical Narrative:      Patient will DC to: [facility]  Anticipated DC date: 08/17/24   Transport by: [facility]  MD to sign controlled scripts, and bedside RN to send with patient at discharge  Per MD patient ready for DC to . RN, patient, , and facility notified of DC. Discharge Summary sent to facility. RN given number for report. DC packet on chart. Ambulance transport requested for patient.   TOC signing off.        Patient Goals and CMS Choice            Discharge Placement                       Discharge Plan and Services Additional resources added to the After Visit Summary for                                       Social Drivers of Health (SDOH) Interventions SDOH Screenings   Food Insecurity: No Food Insecurity (08/09/2024)  Housing: Low Risk  (08/09/2024)  Transportation Needs: No Transportation Needs (08/09/2024)  Utilities: Not At Risk (08/09/2024)  Alcohol Screen: Low Risk  (08/05/2021)  Depression (PHQ2-9): Medium Risk (05/10/2024)  Financial Resource Strain: Low Risk  (05/02/2024)   Received from [facility]  Physical Activity: Insufficiently Active (08/05/2021)  Social Connections: Unknown (08/09/2024)  Stress: No Stress Concern Present (08/05/2021)  Tobacco Use: Medium Risk (08/09/2024)     Readmission Risk Interventions    08/10/2024    2:48 PM 05/20/2024    3:52 PM  Readmission Risk Prevention Plan  Transportation Screening Complete Complete  PCP or Specialist Appt within 3-5 Days  Complete  HRI or Home Care Consult  Complete  Social Work Consult for Recovery Care Planning/Counseling  Complete  Palliative Care Screening  Not Applicable  Medication Review Furniture conservator/restorer)  Complete  HRI or Home Care Consult Complete   Skilled Nursing Facility Complete

## 2024-08-17 NOTE — Consult Note (Addendum)
 WOC Nurse wound follow up MD Ravishankar was present in this consultation. Wound type: surgical and PI stage 4 Measurement: Total 13 cm x 7 cm x 3 cm (wounds connected) Wound bed: 80% red, 20% yellow partial fat tissue. Drainage (amount, consistency, odor) moderate amount, serosanguinous, 250 ml in canister at 1130. Periwound: intact. Dressing procedure/placement/frequency: Pt will be discharge soon. The nursing home facility will continue to use the NPWT.  Applied a wet-to-dry dressing for discharge, moist with Vashe and covered with a ABD pad and tape.  Recommended change twice a week, (MON/THURS).  WOC team will follow further if inpatient. Please reconsult if further assistance is needed. Thank-you,  Lela Holm RN, CNS, [employer], MSN.  (Phone [phone])

## 2024-08-18 DIAGNOSIS — I959 Hypotension, unspecified: Secondary | ICD-10-CM | POA: Diagnosis not present

## 2024-08-18 DIAGNOSIS — D509 Iron deficiency anemia, unspecified: Secondary | ICD-10-CM | POA: Diagnosis not present

## 2024-08-18 DIAGNOSIS — E119 Type 2 diabetes mellitus without complications: Secondary | ICD-10-CM | POA: Diagnosis not present

## 2024-08-18 DIAGNOSIS — J449 Chronic obstructive pulmonary disease, unspecified: Secondary | ICD-10-CM | POA: Diagnosis not present

## 2024-08-18 DIAGNOSIS — G894 Chronic pain syndrome: Secondary | ICD-10-CM | POA: Diagnosis not present

## 2024-08-18 DIAGNOSIS — R296 Repeated falls: Secondary | ICD-10-CM | POA: Diagnosis not present

## 2024-08-18 DIAGNOSIS — S42302A Unspecified fracture of shaft of humerus, left arm, initial encounter for closed fracture: Secondary | ICD-10-CM | POA: Diagnosis not present

## 2024-08-18 DIAGNOSIS — I5032 Chronic diastolic (congestive) heart failure: Secondary | ICD-10-CM | POA: Diagnosis not present

## 2024-08-18 DIAGNOSIS — L089 Local infection of the skin and subcutaneous tissue, unspecified: Secondary | ICD-10-CM | POA: Diagnosis not present

## 2024-08-18 DIAGNOSIS — L89154 Pressure ulcer of sacral region, stage 4: Secondary | ICD-10-CM | POA: Diagnosis not present

## 2024-08-20 DIAGNOSIS — N179 Acute kidney failure, unspecified: Secondary | ICD-10-CM | POA: Diagnosis not present

## 2024-08-20 DIAGNOSIS — G894 Chronic pain syndrome: Secondary | ICD-10-CM | POA: Diagnosis not present

## 2024-08-20 DIAGNOSIS — E876 Hypokalemia: Secondary | ICD-10-CM | POA: Diagnosis not present

## 2024-08-20 DIAGNOSIS — I5032 Chronic diastolic (congestive) heart failure: Secondary | ICD-10-CM | POA: Diagnosis not present

## 2024-08-20 DIAGNOSIS — L89154 Pressure ulcer of sacral region, stage 4: Secondary | ICD-10-CM | POA: Diagnosis not present

## 2024-08-21 DIAGNOSIS — L89154 Pressure ulcer of sacral region, stage 4: Secondary | ICD-10-CM | POA: Diagnosis not present

## 2024-08-21 DIAGNOSIS — S42302A Unspecified fracture of shaft of humerus, left arm, initial encounter for closed fracture: Secondary | ICD-10-CM | POA: Diagnosis not present

## 2024-08-21 DIAGNOSIS — J449 Chronic obstructive pulmonary disease, unspecified: Secondary | ICD-10-CM | POA: Diagnosis not present

## 2024-08-21 DIAGNOSIS — D509 Iron deficiency anemia, unspecified: Secondary | ICD-10-CM | POA: Diagnosis not present

## 2024-08-21 DIAGNOSIS — G894 Chronic pain syndrome: Secondary | ICD-10-CM | POA: Diagnosis not present

## 2024-08-21 DIAGNOSIS — E876 Hypokalemia: Secondary | ICD-10-CM | POA: Diagnosis not present

## 2024-08-21 DIAGNOSIS — Z5181 Encounter for therapeutic drug level monitoring: Secondary | ICD-10-CM | POA: Diagnosis not present

## 2024-08-21 DIAGNOSIS — I5032 Chronic diastolic (congestive) heart failure: Secondary | ICD-10-CM | POA: Diagnosis not present

## 2024-08-21 DIAGNOSIS — N179 Acute kidney failure, unspecified: Secondary | ICD-10-CM | POA: Diagnosis not present

## 2024-08-22 DIAGNOSIS — L089 Local infection of the skin and subcutaneous tissue, unspecified: Secondary | ICD-10-CM | POA: Diagnosis not present

## 2024-08-22 DIAGNOSIS — G894 Chronic pain syndrome: Secondary | ICD-10-CM | POA: Diagnosis not present

## 2024-08-22 DIAGNOSIS — L89154 Pressure ulcer of sacral region, stage 4: Secondary | ICD-10-CM | POA: Diagnosis not present

## 2024-08-22 LAB — LAB REPORT - SCANNED

## 2024-08-23 DIAGNOSIS — L89154 Pressure ulcer of sacral region, stage 4: Secondary | ICD-10-CM | POA: Diagnosis not present

## 2024-08-23 DIAGNOSIS — L089 Local infection of the skin and subcutaneous tissue, unspecified: Secondary | ICD-10-CM | POA: Diagnosis not present

## 2024-08-23 DIAGNOSIS — I159 Secondary hypertension, unspecified: Secondary | ICD-10-CM | POA: Diagnosis not present

## 2024-08-23 DIAGNOSIS — G894 Chronic pain syndrome: Secondary | ICD-10-CM | POA: Diagnosis not present

## 2024-08-24 DIAGNOSIS — G894 Chronic pain syndrome: Secondary | ICD-10-CM | POA: Diagnosis not present

## 2024-08-24 DIAGNOSIS — L089 Local infection of the skin and subcutaneous tissue, unspecified: Secondary | ICD-10-CM | POA: Diagnosis not present

## 2024-08-24 DIAGNOSIS — L89154 Pressure ulcer of sacral region, stage 4: Secondary | ICD-10-CM | POA: Diagnosis not present

## 2024-08-24 DIAGNOSIS — I159 Secondary hypertension, unspecified: Secondary | ICD-10-CM | POA: Diagnosis not present

## 2024-08-25 DIAGNOSIS — L89314 Pressure ulcer of right buttock, stage 4: Secondary | ICD-10-CM | POA: Diagnosis not present

## 2024-08-25 DIAGNOSIS — L03116 Cellulitis of left lower limb: Secondary | ICD-10-CM | POA: Diagnosis not present

## 2024-08-25 DIAGNOSIS — L03115 Cellulitis of right lower limb: Secondary | ICD-10-CM | POA: Diagnosis not present

## 2024-08-25 DIAGNOSIS — L89154 Pressure ulcer of sacral region, stage 4: Secondary | ICD-10-CM | POA: Diagnosis not present

## 2024-08-28 DIAGNOSIS — I951 Orthostatic hypotension: Secondary | ICD-10-CM | POA: Diagnosis not present

## 2024-08-28 DIAGNOSIS — G894 Chronic pain syndrome: Secondary | ICD-10-CM | POA: Diagnosis not present

## 2024-08-28 DIAGNOSIS — L89154 Pressure ulcer of sacral region, stage 4: Secondary | ICD-10-CM | POA: Diagnosis not present

## 2024-08-28 DIAGNOSIS — K5903 Drug induced constipation: Secondary | ICD-10-CM | POA: Diagnosis not present

## 2024-08-28 DIAGNOSIS — L089 Local infection of the skin and subcutaneous tissue, unspecified: Secondary | ICD-10-CM | POA: Diagnosis not present

## 2024-08-28 DIAGNOSIS — I159 Secondary hypertension, unspecified: Secondary | ICD-10-CM | POA: Diagnosis not present

## 2024-08-29 ENCOUNTER — Ambulatory Visit: Admitting: General Surgery

## 2024-08-29 ENCOUNTER — Encounter: Payer: Self-pay | Admitting: General Surgery

## 2024-08-29 VITALS — BP 129/74 | HR 99 | Ht 64.0 in | Wt 101.8 lb

## 2024-08-29 DIAGNOSIS — G894 Chronic pain syndrome: Secondary | ICD-10-CM | POA: Diagnosis not present

## 2024-08-29 DIAGNOSIS — L89150 Pressure ulcer of sacral region, unstageable: Secondary | ICD-10-CM

## 2024-08-29 DIAGNOSIS — Z09 Encounter for follow-up examination after completed treatment for conditions other than malignant neoplasm: Secondary | ICD-10-CM

## 2024-08-29 DIAGNOSIS — I951 Orthostatic hypotension: Secondary | ICD-10-CM | POA: Diagnosis not present

## 2024-08-29 DIAGNOSIS — L89154 Pressure ulcer of sacral region, stage 4: Secondary | ICD-10-CM | POA: Diagnosis not present

## 2024-08-29 DIAGNOSIS — R2689 Other abnormalities of gait and mobility: Secondary | ICD-10-CM | POA: Diagnosis not present

## 2024-08-29 DIAGNOSIS — K5903 Drug induced constipation: Secondary | ICD-10-CM | POA: Diagnosis not present

## 2024-08-29 DIAGNOSIS — L089 Local infection of the skin and subcutaneous tissue, unspecified: Secondary | ICD-10-CM | POA: Diagnosis not present

## 2024-08-29 DIAGNOSIS — I159 Secondary hypertension, unspecified: Secondary | ICD-10-CM | POA: Diagnosis not present

## 2024-08-29 DIAGNOSIS — M6282 Rhabdomyolysis: Secondary | ICD-10-CM | POA: Diagnosis not present

## 2024-08-29 DIAGNOSIS — E119 Type 2 diabetes mellitus without complications: Secondary | ICD-10-CM | POA: Diagnosis not present

## 2024-08-29 NOTE — Patient Instructions (Signed)
 Change your dressing every day. Wet to dry.    Follow-up with our office in 6 weeks.   Please call and ask to speak with a nurse if you develop questions or concerns.

## 2024-08-30 DIAGNOSIS — I951 Orthostatic hypotension: Secondary | ICD-10-CM | POA: Diagnosis not present

## 2024-08-30 DIAGNOSIS — L089 Local infection of the skin and subcutaneous tissue, unspecified: Secondary | ICD-10-CM | POA: Diagnosis not present

## 2024-08-30 DIAGNOSIS — I159 Secondary hypertension, unspecified: Secondary | ICD-10-CM | POA: Diagnosis not present

## 2024-08-30 DIAGNOSIS — S42202D Unspecified fracture of upper end of left humerus, subsequent encounter for fracture with routine healing: Secondary | ICD-10-CM | POA: Diagnosis not present

## 2024-08-30 DIAGNOSIS — S4422XD Injury of radial nerve at upper arm level, left arm, subsequent encounter: Secondary | ICD-10-CM | POA: Diagnosis not present

## 2024-08-30 NOTE — Progress Notes (Signed)
 Outpatient Surgical Follow Up    Kim Graham is an 73 y.o. female.   Chief Complaint  Patient presents with   Routine Post Op    HPI: Kim Graham returns today after having ended incisional debridement of right buttock ulcer and previous pilonidal cyst ulcer.  She reports doing well.  She has refused the wound VAC so they have been doing wet-to-dry dressings.  She is mobilizing much better than when she was in the hospital.  Past Medical History:  Diagnosis Date   Acute cholecystitis 12/09/2021   Bile leak    Chronic kidney disease, stage 3a (HCC)    Chronic venous insufficiency    COPD (chronic obstructive pulmonary disease) (HCC)    Depression    Hep C w/o coma, chronic (HCC)    treated   History of kidney stones    Hypertension    Methadone  dependence (HCC)    Nephrolithiasis    Pre-diabetes    Sepsis secondary to UTI (HCC)    Thrombocytopenia     Past Surgical History:  Procedure Laterality Date   CYSTOSCOPY W/ URETERAL STENT PLACEMENT Right 12/09/2021   Procedure: CYSTOSCOPY WITH RETROGRADE PYELOGRAM/URETERAL STENT PLACEMENT;  Surgeon: Twylla Glendia BROCKS, MD;  Location: [facility] ORS;  Service: Urology;  Laterality: Right;   CYSTOSCOPY/URETEROSCOPY/HOLMIUM LASER/STENT PLACEMENT Bilateral 02/11/2022   Procedure: CYSTOSCOPY/URETEROSCOPY/HOLMIUM LASER/STENT PLACEMENT & RIGHT URETERAL STENT EXCHANGE;  Surgeon: Twylla Glendia BROCKS, MD;  Location: [facility];  Service: Urology;  Laterality: Bilateral;   CYSTOSCOPY/URETEROSCOPY/HOLMIUM LASER/STENT PLACEMENT Bilateral 05/19/2022   Procedure: CYSTOSCOPY/URETEROSCOPY/HOLMIUM LASER/STENT PLACEMENT;  Surgeon: Twylla Glendia BROCKS, MD;  Location: [facility];  Service: Urology;  Laterality: Bilateral;   ENDOSCOPIC RETROGRADE CHOLANGIOPANCREATOGRAPHY (ERCP) WITH PROPOFOL  N/A 12/11/2021   Procedure: ENDOSCOPIC RETROGRADE CHOLANGIOPANCREATOGRAPHY (ERCP) WITH PROPOFOL ;  Surgeon: Jinny Carmine, MD;  Location: [facility] ENDOSCOPY;  Service: Endoscopy;  Laterality:  N/A;   ERCP N/A 03/10/2022   Procedure: ENDOSCOPIC RETROGRADE CHOLANGIOPANCREATOGRAPHY (ERCP);  Surgeon: Jinny Carmine, MD;  Location: [facility] END[facility]COPY;  Service: Endoscopy;  Laterality: N/A;  Stent removal   ERCP N/A 06/23/2022   Procedure: ENDOSCOPIC RETROGRADE CHOLANGIOPANCREATOGRAPHY (ERCP);  Surgeon: Jinny Carmine, MD;  Location: [facility] ENDOSCOPY;  Service: Endoscopy;  Laterality: N/A;  stent removal   PILONIDAL CYST EXCISION N/A 12/29/2023   Procedure: CYST EXCISION PILONIDAL EXTENSIVE;  Surgeon: Marinda Jayson KIDD, MD;  Location: [facility] ORS;  Service: General;  Laterality: N/A;   PLACEMENT OF BREAST IMPLANTS Bilateral    TONSILLECTOMY     WOUND DEBRIDEMENT N/A 08/09/2024   Procedure: Excisional debridement of sacral decubitus ulcer and buttocks ulcer;  Surgeon: Marinda Jayson KIDD, MD;  Location: [facility] ORS;  Service: General;  Laterality: N/A;    Family History  Problem Relation Age of Onset   Diabetes Mother    Cancer Father        Pancreatic   Diabetes Maternal Aunt    Diabetes Maternal Grandmother    Dementia Maternal Grandfather    Heart disease Maternal Grandfather     Social History:  reports that she quit smoking about 5 years ago. Her smoking use included cigarettes. She started smoking about 54 years ago. She has a 24.5 pack-year smoking history. She has been exposed to tobacco smoke. She has never used smokeless tobacco. She reports that she does not currently use alcohol. She reports that she does not currently use drugs after having used the following drugs: Heroin.  Allergies:  Allergies  Allergen Reactions   Doxycycline  Diarrhea    Very bad diarrhea  Medications reviewed.    ROS Full ROS performed and is otherwise negative other than what is stated in HPI   BP 129/74   Pulse 99   Ht 5' 4 (1.626 m)   Wt 101 lb 12.8 oz (46.2 kg)   SpO2 98%   BMI 17.47 kg/m   Physical Exam Patient able to stand up on her own and with assistance will transfer to the exam room  table.  Right buttock wound is healing and well.  There is good granulation tissue at the base.  At the superior medial part of it there is a little bit of tunneling underneath the skin.  At the inferior medial part there is some fibrinous tissue that I sharply debrided with scissors there is a small amount of fibrinous tissue that send the inferior lateral portion but she would not let me debride any of this, sacral ulcer is healing well with good granulation tissue at the base.    No results found for this or any previous visit (from the past 48 hours). No results found.  Assessment/Plan:  Patient status post excisional debridement of buttock and sacral decubitus ulcer.  The wound is actually healing in very nicely.  There is a small amount of fibrinous tissue that I sharply debrided.  Continue wet-to-dry dressings with gauze daily.  We will see her again in 6 to 8 weeks.  Jayson Endow, M.D. [facility]

## 2024-08-31 DIAGNOSIS — G4700 Insomnia, unspecified: Secondary | ICD-10-CM | POA: Diagnosis not present

## 2024-08-31 DIAGNOSIS — L089 Local infection of the skin and subcutaneous tissue, unspecified: Secondary | ICD-10-CM | POA: Diagnosis not present

## 2024-08-31 DIAGNOSIS — L89154 Pressure ulcer of sacral region, stage 4: Secondary | ICD-10-CM | POA: Diagnosis not present

## 2024-08-31 DIAGNOSIS — S42302A Unspecified fracture of shaft of humerus, left arm, initial encounter for closed fracture: Secondary | ICD-10-CM | POA: Diagnosis not present

## 2024-08-31 LAB — COMPREHENSIVE METABOLIC PANEL WITH GFR: EGFR: 86

## 2024-09-03 DIAGNOSIS — S42309A Unspecified fracture of shaft of humerus, unspecified arm, initial encounter for closed fracture: Secondary | ICD-10-CM | POA: Diagnosis not present

## 2024-09-03 DIAGNOSIS — E876 Hypokalemia: Secondary | ICD-10-CM | POA: Diagnosis not present

## 2024-09-03 DIAGNOSIS — R5381 Other malaise: Secondary | ICD-10-CM | POA: Diagnosis not present

## 2024-09-03 DIAGNOSIS — S31000D Unspecified open wound of lower back and pelvis without penetration into retroperitoneum, subsequent encounter: Secondary | ICD-10-CM | POA: Diagnosis not present

## 2024-09-03 DIAGNOSIS — G4700 Insomnia, unspecified: Secondary | ICD-10-CM | POA: Diagnosis not present

## 2024-09-05 DIAGNOSIS — S42302A Unspecified fracture of shaft of humerus, left arm, initial encounter for closed fracture: Secondary | ICD-10-CM | POA: Diagnosis not present

## 2024-09-05 DIAGNOSIS — J069 Acute upper respiratory infection, unspecified: Secondary | ICD-10-CM | POA: Diagnosis not present

## 2024-09-05 DIAGNOSIS — G4700 Insomnia, unspecified: Secondary | ICD-10-CM | POA: Diagnosis not present

## 2024-09-05 DIAGNOSIS — M25512 Pain in left shoulder: Secondary | ICD-10-CM | POA: Diagnosis not present

## 2024-09-06 LAB — COMPREHENSIVE METABOLIC PANEL WITH GFR

## 2024-09-07 ENCOUNTER — Ambulatory Visit: Attending: Infectious Diseases | Admitting: Infectious Diseases

## 2024-09-07 ENCOUNTER — Encounter: Payer: Self-pay | Admitting: Infectious Diseases

## 2024-09-07 ENCOUNTER — Telehealth: Payer: Self-pay

## 2024-09-07 VITALS — BP 122/79 | HR 108 | Temp 98.4°F

## 2024-09-07 DIAGNOSIS — Z9181 History of falling: Secondary | ICD-10-CM | POA: Insufficient documentation

## 2024-09-07 DIAGNOSIS — Z9889 Other specified postprocedural states: Secondary | ICD-10-CM | POA: Diagnosis not present

## 2024-09-07 DIAGNOSIS — Z9049 Acquired absence of other specified parts of digestive tract: Secondary | ICD-10-CM | POA: Insufficient documentation

## 2024-09-07 DIAGNOSIS — Z87891 Personal history of nicotine dependence: Secondary | ICD-10-CM | POA: Diagnosis not present

## 2024-09-07 DIAGNOSIS — B9620 Unspecified Escherichia coli [E. coli] as the cause of diseases classified elsewhere: Secondary | ICD-10-CM | POA: Diagnosis not present

## 2024-09-07 DIAGNOSIS — Z960 Presence of urogenital implants: Secondary | ICD-10-CM | POA: Diagnosis not present

## 2024-09-07 DIAGNOSIS — L89154 Pressure ulcer of sacral region, stage 4: Secondary | ICD-10-CM

## 2024-09-07 DIAGNOSIS — B9683 Acinetobacter baumannii as the cause of diseases classified elsewhere: Secondary | ICD-10-CM | POA: Diagnosis not present

## 2024-09-07 DIAGNOSIS — L089 Local infection of the skin and subcutaneous tissue, unspecified: Secondary | ICD-10-CM | POA: Diagnosis not present

## 2024-09-07 DIAGNOSIS — E1122 Type 2 diabetes mellitus with diabetic chronic kidney disease: Secondary | ICD-10-CM | POA: Insufficient documentation

## 2024-09-07 DIAGNOSIS — D631 Anemia in chronic kidney disease: Secondary | ICD-10-CM | POA: Insufficient documentation

## 2024-09-07 DIAGNOSIS — S31000D Unspecified open wound of lower back and pelvis without penetration into retroperitoneum, subsequent encounter: Secondary | ICD-10-CM | POA: Diagnosis not present

## 2024-09-07 DIAGNOSIS — N1831 Chronic kidney disease, stage 3a: Secondary | ICD-10-CM | POA: Insufficient documentation

## 2024-09-07 DIAGNOSIS — Z79891 Long term (current) use of opiate analgesic: Secondary | ICD-10-CM | POA: Diagnosis not present

## 2024-09-07 DIAGNOSIS — I129 Hypertensive chronic kidney disease with stage 1 through stage 4 chronic kidney disease, or unspecified chronic kidney disease: Secondary | ICD-10-CM | POA: Insufficient documentation

## 2024-09-07 DIAGNOSIS — Z8619 Personal history of other infectious and parasitic diseases: Secondary | ICD-10-CM | POA: Insufficient documentation

## 2024-09-07 DIAGNOSIS — I951 Orthostatic hypotension: Secondary | ICD-10-CM | POA: Diagnosis not present

## 2024-09-07 DIAGNOSIS — Z87442 Personal history of urinary calculi: Secondary | ICD-10-CM | POA: Diagnosis not present

## 2024-09-07 DIAGNOSIS — S42302A Unspecified fracture of shaft of humerus, left arm, initial encounter for closed fracture: Secondary | ICD-10-CM | POA: Diagnosis not present

## 2024-09-07 DIAGNOSIS — J449 Chronic obstructive pulmonary disease, unspecified: Secondary | ICD-10-CM | POA: Diagnosis not present

## 2024-09-07 DIAGNOSIS — L89314 Pressure ulcer of right buttock, stage 4: Secondary | ICD-10-CM | POA: Diagnosis present

## 2024-09-07 DIAGNOSIS — G4700 Insomnia, unspecified: Secondary | ICD-10-CM | POA: Diagnosis not present

## 2024-09-07 DIAGNOSIS — G894 Chronic pain syndrome: Secondary | ICD-10-CM | POA: Insufficient documentation

## 2024-09-07 NOTE — Telephone Encounter (Signed)
 I spoke with Kim Graham with [facility] to confirm orders were received for the patient. I also reiterated the orders for the patient to continue IV abx until 09/22/24 and picc can be removed after last dose per Dr. Fayette.  [name], CMA

## 2024-09-07 NOTE — Progress Notes (Signed)
 NAME: Kim Graham  DOB: 12/24/1950  MRN: [id number]  Date/Time: 09/07/2024 9:54 AM   Subjective:   ?pt here for follow up after recent hospitalization for sacral wounds Between 9/30-10/9/25 Kim Graham is a 73 y.o. with a history of treated hep C  diabetes mellitus, COPD, gallstones, cholecystectomy, complicated by biliary leak in February 2023 followed by biliary sphincterotomy and stent placement, history of bilateral renal stones, stent placement, lithotripsy in 2023,History of actinomyces bacteremia in 2023 which was treated with a prolonged course of antibiotics presents from skilled nursing facility for sacral wounds Patient underwent pilonidal sinus surgery On 12/29/2023 and never followed up with the surgeon after that. She then had multiple falls and then fractured her left humerus neck  on 04/20/2024 and was placed on a coaptation splint developed a left wrist drop.  Was admitted to the hospital in July 2025 with another fall and found to have rhabdomyolysis and she was sent to skilled nursing facility on 05/25/2024.  The pilonidal cyst surgical area had not closed completely on the 05/24/2024 Pt was admitted on 9/30 and she underwent excisional debridement of the rt buttock unstageable wound and the sacral decubitus. Cultures were sent and it grew acinetobacter and e colic  and she was discharged with wound vac and IV unasyn   She is here for follow up from [facility] She is doing better Wound vac has been removed as she found it painful  Past Medical History:  Diagnosis Date   Acute cholecystitis 12/09/2021   Bile leak    Chronic kidney disease, stage 3a (HCC)    Chronic venous insufficiency    COPD (chronic obstructive pulmonary disease) (HCC)    Depression    Hep C w/o coma, chronic (HCC)    treated   History of kidney stones    Hypertension    Methadone  dependence (HCC)    Nephrolithiasis    Pre-diabetes    Sepsis secondary to UTI (HCC)    Thrombocytopenia      Past Surgical History:  Procedure Laterality Date   CYSTOSCOPY W/ URETERAL STENT PLACEMENT Right 12/09/2021   Procedure: CYSTOSCOPY WITH RETROGRADE PYELOGRAM/URETERAL STENT PLACEMENT;  Surgeon: Twylla Glendia BROCKS, MD;  Location: [facility];  Service: Urology;  Laterality: Right;   CYSTOSCOPY/URETEROSCOPY/HOLMIUM LASER/STENT PLACEMENT Bilateral 02/11/2022   Procedure: CYSTOSCOPY/URETEROSCOPY/HOLMIUM LASER/STENT PLACEMENT & RIGHT URETERAL STENT EXCHANGE;  Surgeon: Twylla Glendia BROCKS, MD;  Location: [facility];  Service: Urology;  Laterality: Bilateral;   CYSTOSCOPY/URETEROSCOPY/HOLMIUM LASER/STENT PLACEMENT Bilateral 05/19/2022   Procedure: CYSTOSCOPY/URETEROSCOPY/HOLMIUM LASER/STENT PLACEMENT;  Surgeon: Twylla Glendia BROCKS, MD;  Location: [facility];  Service: Urology;  Laterality: Bilateral;   ENDOSCOPIC RETROGRADE CHOLANGIOPANCREATOGRAPHY (ERCP) WITH PROPOFOL  N/A 12/11/2021   Procedure: ENDOSCOPIC RETROGRADE CHOLANGIOPANCREATOGRAPHY (ERCP) WITH PROPOFOL ;  Surgeon: Jinny Carmine, MD;  Location: [facility];  Service: Endoscopy;  Laterality: N/A;   ERCP N/A 03/10/2022   Procedure: ENDOSCOPIC RETROGRADE CHOLANGIOPANCREATOGRAPHY (ERCP);  Surgeon: Jinny Carmine, MD;  Location: [facility] ENDOSCOPY;  Service: Endoscopy;  Laterality: N/A;  Stent removal   ERCP N/A 06/23/2022   Procedure: ENDOSCOPIC RETROGRADE CHOLANGIOPANCREATOGRAPHY (ERCP);  Surgeon: Jinny Carmine, MD;  Location: [facility] ENDOSCOPY;  Service: Endoscopy;  Laterality: N/A;  stent removal   PILONIDAL CYST EXCISION N/A 12/29/2023   Procedure: CYST EXCISION PILONIDAL EXTENSIVE;  Surgeon: Marinda Jayson KIDD, MD;  Location: [facility];  Service: General;  Laterality: N/A;   PLACEMENT OF BREAST IMPLANTS Bilateral    TONSILLECTOMY     WOUND DEBRIDEMENT N/A 08/09/2024   Procedure: Excisional debridement of sacral  decubitus ulcer and buttocks ulcer;  Surgeon: Marinda Jayson KIDD, MD;  Location: [facility];  Service: General;  Laterality: N/A;    Social History    Socioeconomic History   Marital status: Single    Spouse name: Not on file   Number of children: Not on file   Years of education: Not on file   Highest education level: Not on file  Occupational History   Not on file  Tobacco Use   Smoking status: Former    Current packs/day: 0.00    Average packs/day: 0.5 packs/day for 49.0 years (24.5 ttl pk-yrs)    Types: Cigarettes    Start date: 12/13/1969    Quit date: 12/13/2018    Years since quitting: 5.7    Passive exposure: Past   Smokeless tobacco: Never  Vaping Use   Vaping status: Never Used  Substance and Sexual Activity   Alcohol use: Not Currently    Comment: On occasion   Drug use: Not Currently    Types: Heroin    Comment: revovering addict   Sexual activity: Not Currently  Other Topics Concern   Not on file  Social History Narrative   Not on file   Social Drivers of Health   Financial Resource Strain: Low Risk  (05/02/2024)   Received from [facility]   Overall Financial Resource Strain (CARDIA)    Difficulty of Paying Living Expenses: Not hard at all  Food Insecurity: No Food Insecurity (08/09/2024)   Hunger Vital Sign    Worried About Running Out of Food in the Last Year: Never true    Ran Out of Food in the Last Year: Never true  Transportation Needs: No Transportation Needs (08/09/2024)   PRAPARE - Administrator, Civil Service (Medical): No    Lack of Transportation (Non-Medical): No  Physical Activity: Insufficiently Active (08/05/2021)   Exercise Vital Sign    Days of Exercise per Week: 4 days    Minutes of Exercise per Session: 20 min  Stress: No Stress Concern Present (08/05/2021)   [employer] of Occupational Health - Occupational Stress Questionnaire    Feeling of Stress : Not at all  Social Connections: Unknown (08/09/2024)   Social Connection and Isolation Panel    Frequency of Communication with Friends and Family: Three times a week    Frequency of Social  Gatherings with Friends and Family: Three times a week    Attends Religious Services: Never    Active Member of Clubs or Organizations: No    Attends Banker Meetings: Never    Marital Status: Patient declined  Intimate Partner Violence: Not At Risk (08/09/2024)   Humiliation, Afraid, Rape, and Kick questionnaire    Fear of Current or Ex-Partner: No    Emotionally Abused: No    Physically Abused: No    Sexually Abused: No    Family History  Problem Relation Age of Onset   Diabetes Mother    Cancer Father        Pancreatic   Diabetes Maternal Aunt    Diabetes Maternal Grandmother    Dementia Maternal Grandfather    Heart disease Maternal Grandfather    Allergies  Allergen Reactions   Doxycycline  Diarrhea    Very bad diarrhea   I? Current Outpatient Medications  Medication Sig Dispense Refill   acetaminophen  (TYLENOL ) 325 MG tablet Take 2 tablets (650 mg total) by mouth every 6 (six) hours as needed for mild pain (pain score 1-3), fever  or headache (or Fever >/= 101).     albuterol  (VENTOLIN  HFA) 108 (90 Base) MCG/ACT inhaler Inhale 2 puffs into the lungs every 6 (six) hours as needed for wheezing or shortness of breath. 8.5 each 6   ammonium lactate (LAC-HYDRIN) 12 % lotion Apply 1 Application topically as needed for dry skin.     ampicillin -sulbactam (UNASYN ) IVPB Inject 3 g into the vein every 6 (six) hours for 22 days. Indication:  R buttock and sacral decubitus wounds Last Day of Therapy:  09/07/2024 Labs - Once weekly:  CBC/D, CMP, ESR and CRP Fax weekly lab results  promptly to [phone] Method of administration: Mini-Bag Plus / Gravity Method of administration may be changed at the discretion of nursing facility and/or its pharmacy Please pull PIC at completion of IV antibiotics Call [phone] with critical values or questions 88 Units 0   ARIPiprazole  (ABILIFY ) 30 MG tablet Take 1 tablet (30 mg total) by mouth daily. 90 tablet 1   ascorbic acid   (VITAMIN C ) 500 MG tablet Take 1 tablet (500 mg total) by mouth daily.     busPIRone (BUSPAR) 7.5 MG tablet Take 7.5 mg by mouth 2 (two) times daily.     cyanocobalamin  (VITAMIN B12) 500 MCG tablet Take 1 tablet (500 mcg total) by mouth daily.     dextromethorphan-guaiFENesin (MUCINEX DM) 30-600 MG 12hr tablet Take 1 tablet by mouth 2 (two) times daily as needed for cough.     docusate sodium  (COLACE) 100 MG capsule Take 100 mg by mouth 2 (two) times daily.     fluticasone  (FLONASE ) 50 MCG/ACT nasal spray Place 2 sprays into both nostrils daily. 16 g 6   Fluticasone -Umeclidin-Vilant (TRELEGY ELLIPTA ) 100-62.5-25 MCG/ACT AEPB Inhale 1 puff into the lungs daily.     furosemide  (LASIX ) 20 MG tablet Take 20 mg by mouth daily.     ibuprofen  (ADVIL ) 400 MG tablet Take 1 tablet (400 mg total) by mouth every 6 (six) hours as needed for fever, headache or mild pain (pain score 1-3).     iron  polysaccharides (NIFEREX) 150 MG capsule Take 1 capsule (150 mg total) by mouth daily.     methadone  (DOLOPHINE ) 10 MG/ML solution Take 5 mLs (50 mg total) by mouth every 12 (twelve) hours. 300 mL 0   midodrine  (PROAMATINE ) 10 MG tablet Take 1 tablet (10 mg total) by mouth 3 (three) times daily with meals. Hold if SBP > 120 mmHg and/or HR <65     Multiple Vitamin (MULTI-VITAMIN) tablet Take 1 tablet by mouth daily.     Nutritional Supplements (,FEEDING SUPPLEMENT, PROSOURCE PLUS) liquid Take 30 mLs by mouth 2 (two) times daily between meals.     oxyCODONE  7.5 MG TABS Take 7.5 mg by mouth every 4 (four) hours as needed for moderate pain (pain score 4-6) or severe pain (pain score 7-10). 30 tablet 0   polyethylene glycol (MIRALAX  / GLYCOLAX ) 17 g packet Take 17 g by mouth daily.     potassium chloride  (KLOR-CON  M) 10 MEQ tablet Take 10 mEq by mouth 2 (two) times daily.     Spacer/Aero-Holding Raguel FRENCH Use as directed Dx: COPD 1 each 0   Vitamin D , Ergocalciferol , (DRISDOL ) 1.25 MG (50000 UNIT) CAPS capsule Take 1  capsule (50,000 Units total) by mouth every 7 (seven) days.     No current facility-administered medications for this visit.     Abtx:  Anti-infectives (From admission, onward)    None       REVIEW  OF SYSTEMS:  Const: negative fever, negative chills, negative weight loss Eyes: negative diplopia or visual changes, negative eye pain ENT: negative coryza, negative sore throat Resp: negative cough, hemoptysis, dyspnea Cards: negative for chest pain, palpitations, lower extremity edema GU: negative for frequency, dysuria and hematuria GI: Negative for abdominal pain, diarrhea, bleeding, constipation Skin: negative for rash and pruritus Heme: negative for easy bruising and gum/nose bleeding MS: in wheel chair Neurolo:negative for headaches, dizziness, vertigo, memory problems  Psych: negative for feelings of anxiety, depression  Endocrine:  diabetes Allergy/Immunology- doxy  Objective:  VITALS:  BP 122/79   Pulse (!) 108   Temp 98.4 F (36.9 C) (Temporal)   SpO2 94%   PHYSICAL EXAM:  General: Alert, cooperative, no distress, appears stated age. emaciated Back:   80% granulation tissue- some fibrin tissue Lungs: Clear to auscultation bilaterally. No Wheezing or Rhonchi. No rales. Heart: Regular rate and rhythm, no murmur, rub or gallop. Abdomen: Soft, non-tender,not distended. Bowel sounds normal. No masses Extremities:left arm in brace Rt PICC Skin: No rashes or lesions. Or bruising Lymph: Cervical, supraclavicular normal. Neurologic: Grossly non-focal Pertinent Labs Need to get lab results from [facility]   Impression/Recommendation ?Right buttock stage IV decubitus- looks well Sacral decubitus s/p debridement- acinetobacter and ecoli  On unasyn  she will finish 4 weeks today will extend for 2 more weeks 09/22/24  Anemia  Left humerus fracture  with malalignment  has brace  Chronic pain syndrome on methadone   COPD    ________________________________________________ Discussed with patient,in detail Follow up PRN Follow up with surgeon

## 2024-09-07 NOTE — Patient Instructions (Addendum)
 You are here for follow up of the decubitus ulcers on you rt buttock and sacrum stage IV. Looking better- but you asked for wound vac to be removed Continue unasyn  3 grams IV every 6 hours until 09/22/24 Cbc/cmp/esr/crp every week only while on IV antibiotic Remove PICC at the completion of antibiotic Follow up with surgeon for the wounds Dont need to see ID again

## 2024-09-11 DIAGNOSIS — L089 Local infection of the skin and subcutaneous tissue, unspecified: Secondary | ICD-10-CM | POA: Diagnosis not present

## 2024-09-11 DIAGNOSIS — I951 Orthostatic hypotension: Secondary | ICD-10-CM | POA: Diagnosis not present

## 2024-09-11 DIAGNOSIS — G4700 Insomnia, unspecified: Secondary | ICD-10-CM | POA: Diagnosis not present

## 2024-09-11 DIAGNOSIS — S31000D Unspecified open wound of lower back and pelvis without penetration into retroperitoneum, subsequent encounter: Secondary | ICD-10-CM | POA: Diagnosis not present

## 2024-09-12 ENCOUNTER — Telehealth: Admitting: Infectious Diseases

## 2024-09-27 NOTE — Progress Notes (Signed)
 Kim Graham                                          MRN: [id number]   09/27/2024   The VBCI Quality Team Specialist reviewed this patient medical record for the purposes of chart review for care gap closure. The following were reviewed: chart review for care gap closure-kidney health evaluation for diabetes:eGFR  and uACR.    VBCI Quality Team

## 2024-10-12 ENCOUNTER — Ambulatory Visit: Admitting: General Surgery

## 2024-10-12 ENCOUNTER — Encounter: Payer: Self-pay | Admitting: General Surgery

## 2024-10-12 VITALS — BP 129/75 | HR 114 | Temp 98.2°F | Ht 64.0 in | Wt 97.4 lb

## 2024-10-12 DIAGNOSIS — L89150 Pressure ulcer of sacral region, unstageable: Secondary | ICD-10-CM

## 2024-10-12 DIAGNOSIS — L89154 Pressure ulcer of sacral region, stage 4: Secondary | ICD-10-CM

## 2024-10-12 DIAGNOSIS — Z09 Encounter for follow-up examination after completed treatment for conditions other than malignant neoplasm: Secondary | ICD-10-CM | POA: Diagnosis not present

## 2024-10-12 NOTE — Progress Notes (Signed)
 Outpatient Surgical Follow Up  10/12/2024  Kim Graham is an 73 y.o. female.   Chief Complaint  Patient presents with   Routine Post Op    Excision sacral ulcer 08/09/2024    HPI: The patient returns today for wound check.  She had a pilonidal cyst excision several months ago and was lost to follow-up.  She then unfortunately fell and broke her arm.  She was in a rehab facility and developed a sacral decubitus ulcer.  She came in for debridement of this.  She reports doing well.  She says that her strength is improving and she is getting rehab in her left arm.  They are changing the dressing daily.  There has been no purulent drainage.  Past Medical History:  Diagnosis Date   Acute cholecystitis 12/09/2021   Bile leak    Chronic kidney disease, stage 3a (HCC)    Chronic venous insufficiency    COPD (chronic obstructive pulmonary disease) (HCC)    Depression    Hep C w/o coma, chronic (HCC)    treated   History of kidney stones    Hypertension    Methadone  dependence (HCC)    Nephrolithiasis    Pre-diabetes    Sepsis secondary to UTI (HCC)    Thrombocytopenia     Past Surgical History:  Procedure Laterality Date   CYSTOSCOPY W/ URETERAL STENT PLACEMENT Right 12/09/2021   Procedure: CYSTOSCOPY WITH RETROGRADE PYELOGRAM/URETERAL STENT PLACEMENT;  Surgeon: Twylla Glendia BROCKS, MD;  Location: [facility];  Service: Urology;  Laterality: Right;   CYSTOSCOPY/URETEROSCOPY/HOLMIUM LASER/STENT PLACEMENT Bilateral 02/11/2022   Procedure: CYSTOSCOPY/URETEROSCOPY/HOLMIUM LASER/STENT PLACEMENT & RIGHT URETERAL STENT EXCHANGE;  Surgeon: Twylla Glendia BROCKS, MD;  Location: [facility];  Service: Urology;  Laterality: Bilateral;   CYSTOSCOPY/URETEROSCOPY/HOLMIUM LASER/STENT PLACEMENT Bilateral 05/19/2022   Procedure: CYSTOSCOPY/URETEROSCOPY/HOLMIUM LASER/STENT PLACEMENT;  Surgeon: Twylla Glendia BROCKS, MD;  Location: [facility];  Service: Urology;  Laterality: Bilateral;   ENDOSCOPIC RETROGRADE  CHOLANGIOPANCREATOGRAPHY (ERCP) WITH PROPOFOL  N/A 12/11/2021   Procedure: ENDOSCOPIC RETROGRADE CHOLANGIOPANCREATOGRAPHY (ERCP) WITH PROPOFOL ;  Surgeon: Jinny Carmine, MD;  Location: [facility] END[facility]COPY;  Service: Endoscopy;  Laterality: N/A;   ERCP N/A 03/10/2022   Procedure: ENDOSCOPIC RETROGRADE CHOLANGIOPANCREATOGRAPHY (ERCP);  Surgeon: Jinny Carmine, MD;  Location: [facility] ENDOSCOPY;  Service: Endoscopy;  Laterality: N/A;  Stent removal   ERCP N/A 06/23/2022   Procedure: ENDOSCOPIC RETROGRADE CHOLANGIOPANCREATOGRAPHY (ERCP);  Surgeon: Jinny Carmine, MD;  Location: [facility] ENDOSCOPY;  Service: Endoscopy;  Laterality: N/A;  stent removal   PILONIDAL CYST EXCISION N/A 12/29/2023   Procedure: CYST EXCISION PILONIDAL EXTENSIVE;  Surgeon: Marinda Jayson KIDD, MD;  Location: [facility];  Service: General;  Laterality: N/A;   PLACEMENT OF BREAST IMPLANTS Bilateral    TONSILLECTOMY     WOUND DEBRIDEMENT N/A 08/09/2024   Procedure: Excisional debridement of sacral decubitus ulcer and buttocks ulcer;  Surgeon: Marinda Jayson KIDD, MD;  Location: [facility];  Service: General;  Laterality: N/A;    Family History  Problem Relation Age of Onset   Diabetes Mother    Cancer Father        Pancreatic   Diabetes Maternal Aunt    Diabetes Maternal Grandmother    Dementia Maternal Grandfather    Heart disease Maternal Grandfather     Social History:  reports that she quit smoking about 5 years ago. Her smoking use included cigarettes. She started smoking about 54 years ago. She has a 24.5 pack-year smoking history. She has been exposed to tobacco smoke. She has never used smokeless tobacco.  She reports that she does not currently use alcohol. She reports that she does not currently use drugs after having used the following drugs: Heroin.  Allergies:  Allergies  Allergen Reactions   Doxycycline  Diarrhea    Very bad diarrhea    Medications reviewed.    ROS Full ROS performed and is otherwise negative other than what is  stated in HPI   BP 129/75   Pulse (!) 114   Temp 98.2 F (36.8 C) (Oral)   Ht 5' 4 (1.626 m)   Wt 97 lb 6.4 oz (44.2 kg)   SpO2 94%   BMI 16.72 kg/m   Physical Exam Buttock exam performed in the presence of a chaperone.  Inner central gluteal cleft wound there is good granulation tissue at the base.  There is a small amount of fibrinous tissue that I was able to scrape off with a gauze pad.  The right lateral incision has good granulation tissue at its base and is healing in well.  The skin surrounding and overlying the wound appears healthy.    No results found for this or any previous visit (from the past 48 hours). No results found.  Assessment/Plan:  Patient status post excisional debridement of sacral decubitus ulcer.  The wound is healing in well and there is no evidence of infection.  Continue local wound care.  Will see her again in 8 weeks  20 minutes was spent reviewing the patient's chart, performing history and physical and discussing treatment options with the patient  Jayson Endow, M.D. [facility]\

## 2024-10-12 NOTE — Patient Instructions (Signed)
 Removing Dead or Infected Tissue for Healing (Surgical Wound Debridement): What to Expect Surgical wound debridement is when dead and infected tissue is removed from a wound. This helps the wound heal better. Things you may need removed include: Dead tissue. Scar tissue. Fluid that has built up. Debris from outside your body. You may need this type of debridement if you have a lot of tissue that needs to be taken out, or if you have a wound that hasn't gotten better with other treatments. In most cases, it's done in an operating room. During the debridement, you may be given medicine to: Numb the affected body part. Put you to sleep. Tell a health care provider about: Any allergies you have. All medicines you take. These include vitamins, herbs, eye drops, and creams. Any problems you or family members have had with anesthesia. Any bleeding problems you have. Any surgeries you've had. Any medical problems you have. Whether you're pregnant or may be pregnant. What are the risks? Your health care provider will talk with you about risks. These may include: Bleeding. Infection. Damage to nerves, blood vessels, or healthy tissue inside the wound. Scarring. Loss of use of the body part. What happens before? Medicines Ask about changing or stopping: Any medicines you take. Any vitamins, herbs, or supplements you take. Do not take aspirin or ibuprofen unless you're told to. Procedure safety For your safety, you may: Need to wash your skin with a soap that kills germs. Get antibiotics. Have your procedure site marked. Have hair removed at the procedure site. General instructions Eat and drink only as you've been told. Ask if you'll be staying overnight in the hospital. If you'll be going home right after the procedure, plan to have a responsible adult: Drive you home from the hospital or clinic. You won't be allowed to drive. Stay with you for the time you're told. What happens  during surgical wound debridement?  An IV will be put into a vein in your hand or arm. You may be given: A sedative to help you relax. Anesthesia to keep you from feeling pain. Your wound will be cleaned with a germ-free (sterile) liquid. Sharp tools, a laser, or a high-pressured water stream will be used to remove dead and infected tissue. The wound will be cleaned again. A bandage will be put on your wound. These steps may vary. Ask what you can expect. What happens after? You'll be watched closely until you leave. This includes checking your pain level, blood pressure, heart rate, and breathing rate. You'll keep getting antibiotics if you were already getting them. You may also need a different type of debridement or special dressing. This can help with wound healing. This information is not intended to replace advice given to you by your health care provider. Make sure you discuss any questions you have with your health care provider. Document Revised: 07/01/2023 Document Reviewed: 07/01/2023 Elsevier Patient Education  2024 [employer].

## 2024-10-18 NOTE — Progress Notes (Signed)
 Kim Graham                                          MRN: [id number]   10/18/2024   The VBCI Quality Team Specialist reviewed this patient medical record for the purposes of chart review for care gap closure. The following were reviewed: chart review for care gap closure-kidney health evaluation for diabetes:eGFR  and uACR.    VBCI Quality Team

## 2024-12-14 ENCOUNTER — Ambulatory Visit: Payer: Medicare (Managed Care) | Admitting: General Surgery

## 2024-12-14 ENCOUNTER — Encounter: Payer: Self-pay | Admitting: General Surgery

## 2024-12-14 VITALS — BP 115/68 | HR 111 | Temp 97.9°F | Ht 64.0 in | Wt 107.0 lb

## 2024-12-14 DIAGNOSIS — L89150 Pressure ulcer of sacral region, unstageable: Secondary | ICD-10-CM

## 2024-12-14 NOTE — Progress Notes (Signed)
 Outpatient Surgical Follow Up  12/14/2024  Kim Graham is an 74 y.o. female.   Chief Complaint  Patient presents with   Follow-up    Debridement buttocks ulcer    HPI: The patient returns today status post debridement of sacral decubitus ulcer.  She is still at her skilled nursing facility.  She had broken her left arm and is continuing to get therapy for this.  She says that her mobility is improving and she is able to walk now.  She reports as far as her decubitus ulcers that she still has a little bit of drainage.  She denies any fevers or chills.  She said that there is not much pain at the area.  The facility that she is at is dressing it with gauze daily.  She is having normal bowel movements.  Past Medical History:  Diagnosis Date   Acute cholecystitis 12/09/2021   Bile leak    Chronic kidney disease, stage 3a (HCC)    Chronic venous insufficiency    COPD (chronic obstructive pulmonary disease) (HCC)    Depression    Hep C w/o coma, chronic (HCC)    treated   History of kidney stones    Hypertension    Methadone  dependence (HCC)    Nephrolithiasis    Pre-diabetes    Sepsis secondary to UTI (HCC)    Thrombocytopenia     Past Surgical History:  Procedure Laterality Date   CYSTOSCOPY W/ URETERAL STENT PLACEMENT Right 12/09/2021   Procedure: CYSTOSCOPY WITH RETROGRADE PYELOGRAM/URETERAL STENT PLACEMENT;  Surgeon: Twylla Glendia BROCKS, MD;  Location: [facility];  Service: Urology;  Laterality: Right;   CYSTOSCOPY/URETEROSCOPY/HOLMIUM LASER/STENT PLACEMENT Bilateral 02/11/2022   Procedure: CYSTOSCOPY/URETEROSCOPY/HOLMIUM LASER/STENT PLACEMENT & RIGHT URETERAL STENT EXCHANGE;  Surgeon: Twylla Glendia BROCKS, MD;  Location: [facility];  Service: Urology;  Laterality: Bilateral;   CYSTOSCOPY/URETEROSCOPY/HOLMIUM LASER/STENT PLACEMENT Bilateral 05/19/2022   Procedure: CYSTOSCOPY/URETEROSCOPY/HOLMIUM LASER/STENT PLACEMENT;  Surgeon: Twylla Glendia BROCKS, MD;  Location: [facility];  Service: Urology;   Laterality: Bilateral;   ENDOSCOPIC RETROGRADE CHOLANGIOPANCREATOGRAPHY (ERCP) WITH PROPOFOL  N/A 12/11/2021   Procedure: ENDOSCOPIC RETROGRADE CHOLANGIOPANCREATOGRAPHY (ERCP) WITH PROPOFOL ;  Surgeon: Jinny Carmine, MD;  Location: [facility];  Service: Endoscopy;  Laterality: N/A;   ERCP N/A 03/10/2022   Procedure: ENDOSCOPIC RETROGRADE CHOLANGIOPANCREATOGRAPHY (ERCP);  Surgeon: Jinny Carmine, MD;  Location: [facility];  Service: Endoscopy;  Laterality: N/A;  Stent removal   ERCP N/A 06/23/2022   Procedure: ENDOSCOPIC RETROGRADE CHOLANGIOPANCREATOGRAPHY (ERCP);  Surgeon: Jinny Carmine, MD;  Location: [facility] ENDOSCOPY;  Service: Endoscopy;  Laterality: N/A;  stent removal   PILONIDAL CYST EXCISION N/A 12/29/2023   Procedure: CYST EXCISION PILONIDAL EXTENSIVE;  Surgeon: Marinda Jayson KIDD, MD;  Location: [facility];  Service: General;  Laterality: N/A;   PLACEMENT OF BREAST IMPLANTS Bilateral    TONSILLECTOMY     WOUND DEBRIDEMENT N/A 08/09/2024   Procedure: Excisional debridement of sacral decubitus ulcer and buttocks ulcer;  Surgeon: Marinda Jayson KIDD, MD;  Location: [facility];  Service: General;  Laterality: N/A;    Family History  Problem Relation Age of Onset   Diabetes Mother    Cancer Father        Pancreatic   Diabetes Maternal Aunt    Diabetes Maternal Grandmother    Dementia Maternal Grandfather    Heart disease Maternal Grandfather     Social History:  reports that she quit smoking about 6 years ago. Her smoking use included cigarettes. She started smoking about 55 years ago. She has a 24.5  pack-year smoking history. She has been exposed to tobacco smoke. She has never used smokeless tobacco. She reports that she does not currently use alcohol. She reports that she does not currently use drugs after having used the following drugs: Heroin.  Allergies: Allergies[1]  Medications reviewed.    ROS Full ROS performed and is otherwise negative other than what is stated in  HPI   BP 115/68   Pulse (!) 111   Temp 97.9 F (36.6 C) (Oral)   Ht 5' 4 (1.626 m)   Wt 107 lb (48.5 kg)   SpO2 93%   BMI 18.37 kg/m   Physical Exam Performed in the presence of a chaperone.  At the superior gluteal cleft there is an area measuring approximately 2 to 3 cm that has good granulation tissue at its base.  At the inferior aspect there is some fibrinous exudate that I was able to remove with gauze.  There is no surrounding erythema or pain to touch at the area.  Just right lateral and inferior to this that is a well-healed spot.  There is a small area of granulation tissue but the skin is healing well.  There is no drainage from this area.    No results found for this or any previous visit (from the past 48 hours). No results found.  Assessment/Plan:  Patient status post debridement of sacral decubitus ulcers.  These are healing and quite well.  There is good granulation tissue at the base.  There is no surrounding erythema or undrained fluid collections.  At this time I recommended continue gauze with dressing over the area to keep it clean.  I discussed with her that given that she sits a lot and may be very prolonged time to full healing but there is no signs of infection and it is healing well.  We will see her again in 3 months.   A total of 21 minutes was spent reviewing the patient's chart, performing history and physical and discussing treatment options with the patient  Jayson Endow, M.D. [facility]      [1]  Allergies Allergen Reactions   Doxycycline  Diarrhea    Very bad diarrhea

## 2024-12-14 NOTE — Patient Instructions (Signed)
 Removing Dead or Infected Tissue for Healing (Surgical Wound Debridement): What to Expect Surgical wound debridement is when dead and infected tissue is removed from a wound. This helps the wound heal better. Things you may need removed include: Dead tissue. Scar tissue. Fluid that has built up. Debris from outside your body. You may need this type of debridement if you have a lot of tissue that needs to be taken out, or if you have a wound that hasn't gotten better with other treatments. In most cases, it's done in an operating room. During the debridement, you may be given medicine to: Numb the affected body part. Put you to sleep. Tell a health care provider about: Any allergies you have. All medicines you take. These include vitamins, herbs, eye drops, and creams. Any problems you or family members have had with anesthesia. Any bleeding problems you have. Any surgeries you've had. Any medical problems you have. Whether you're pregnant or may be pregnant. What are the risks? Your health care provider will talk with you about risks. These may include: Bleeding. Infection. Damage to nerves, blood vessels, or healthy tissue inside the wound. Scarring. Loss of use of the body part. What happens before? Medicines Ask about changing or stopping: Any medicines you take. Any vitamins, herbs, or supplements you take. Do not take aspirin or ibuprofen unless you're told to. Procedure safety For your safety, you may: Need to wash your skin with a soap that kills germs. Get antibiotics. Have your procedure site marked. Have hair removed at the procedure site. General instructions Eat and drink only as you've been told. Ask if you'll be staying overnight in the hospital. If you'll be going home right after the procedure, plan to have a responsible adult: Drive you home from the hospital or clinic. You won't be allowed to drive. Stay with you for the time you're told. What happens  during surgical wound debridement?  An IV will be put into a vein in your hand or arm. You may be given: A sedative to help you relax. Anesthesia to keep you from feeling pain. Your wound will be cleaned with a germ-free (sterile) liquid. Sharp tools, a laser, or a high-pressured water stream will be used to remove dead and infected tissue. The wound will be cleaned again. A bandage will be put on your wound. These steps may vary. Ask what you can expect. What happens after? You'll be watched closely until you leave. This includes checking your pain level, blood pressure, heart rate, and breathing rate. You'll keep getting antibiotics if you were already getting them. You may also need a different type of debridement or special dressing. This can help with wound healing. This information is not intended to replace advice given to you by your health care provider. Make sure you discuss any questions you have with your health care provider. Document Revised: 07/01/2023 Document Reviewed: 07/01/2023 Elsevier Patient Education  2024 [employer].
